# Patient Record
Sex: Female | Born: 1950 | Race: White | Hispanic: No | Marital: Married | Smoking: Current every day smoker
Health system: Southern US, Community
[De-identification: ages and names within clinical notes are randomized; demographics above are authoritative.]

## PROBLEM LIST (undated history)

## (undated) DIAGNOSIS — R569 Unspecified convulsions: Secondary | ICD-10-CM

## (undated) DIAGNOSIS — Z860100 Personal history of colon polyps, unspecified: Secondary | ICD-10-CM

## (undated) DIAGNOSIS — N3941 Urge incontinence: Secondary | ICD-10-CM

## (undated) DIAGNOSIS — M51369 Other intervertebral disc degeneration, lumbar region without mention of lumbar back pain or lower extremity pain: Secondary | ICD-10-CM

## (undated) DIAGNOSIS — I700 Atherosclerosis of aorta: Secondary | ICD-10-CM

## (undated) DIAGNOSIS — R768 Other specified abnormal immunological findings in serum: Secondary | ICD-10-CM

## (undated) DIAGNOSIS — G2581 Restless legs syndrome: Secondary | ICD-10-CM

## (undated) DIAGNOSIS — K3184 Gastroparesis: Secondary | ICD-10-CM

## (undated) DIAGNOSIS — I7300 Raynaud's syndrome without gangrene: Secondary | ICD-10-CM

## (undated) DIAGNOSIS — C539 Malignant neoplasm of cervix uteri, unspecified: Secondary | ICD-10-CM

## (undated) DIAGNOSIS — R531 Weakness: Secondary | ICD-10-CM

## (undated) DIAGNOSIS — I10 Essential (primary) hypertension: Secondary | ICD-10-CM

## (undated) DIAGNOSIS — M5030 Other cervical disc degeneration, unspecified cervical region: Secondary | ICD-10-CM

## (undated) DIAGNOSIS — J449 Chronic obstructive pulmonary disease, unspecified: Secondary | ICD-10-CM

## (undated) DIAGNOSIS — K769 Liver disease, unspecified: Secondary | ICD-10-CM

## (undated) DIAGNOSIS — M1990 Unspecified osteoarthritis, unspecified site: Secondary | ICD-10-CM

## (undated) DIAGNOSIS — M549 Dorsalgia, unspecified: Secondary | ICD-10-CM

## (undated) DIAGNOSIS — F32A Depression, unspecified: Secondary | ICD-10-CM

## (undated) DIAGNOSIS — I872 Venous insufficiency (chronic) (peripheral): Secondary | ICD-10-CM

## (undated) DIAGNOSIS — Z923 Personal history of irradiation: Secondary | ICD-10-CM

## (undated) DIAGNOSIS — Z8669 Personal history of other diseases of the nervous system and sense organs: Secondary | ICD-10-CM

## (undated) DIAGNOSIS — K648 Other hemorrhoids: Secondary | ICD-10-CM

## (undated) DIAGNOSIS — K5790 Diverticulosis of intestine, part unspecified, without perforation or abscess without bleeding: Secondary | ICD-10-CM

## (undated) DIAGNOSIS — R911 Solitary pulmonary nodule: Secondary | ICD-10-CM

## (undated) DIAGNOSIS — M7989 Other specified soft tissue disorders: Secondary | ICD-10-CM

## (undated) DIAGNOSIS — I8390 Asymptomatic varicose veins of unspecified lower extremity: Secondary | ICD-10-CM

## (undated) DIAGNOSIS — M7710 Lateral epicondylitis, unspecified elbow: Secondary | ICD-10-CM

## (undated) DIAGNOSIS — I639 Cerebral infarction, unspecified: Secondary | ICD-10-CM

## (undated) DIAGNOSIS — K219 Gastro-esophageal reflux disease without esophagitis: Secondary | ICD-10-CM

## (undated) DIAGNOSIS — M5136 Other intervertebral disc degeneration, lumbar region: Secondary | ICD-10-CM

## (undated) DIAGNOSIS — G8929 Other chronic pain: Secondary | ICD-10-CM

## (undated) DIAGNOSIS — H269 Unspecified cataract: Secondary | ICD-10-CM

## (undated) DIAGNOSIS — R7303 Prediabetes: Secondary | ICD-10-CM

## (undated) DIAGNOSIS — F419 Anxiety disorder, unspecified: Secondary | ICD-10-CM

## (undated) DIAGNOSIS — F329 Major depressive disorder, single episode, unspecified: Secondary | ICD-10-CM

## (undated) DIAGNOSIS — J189 Pneumonia, unspecified organism: Secondary | ICD-10-CM

## (undated) DIAGNOSIS — G5600 Carpal tunnel syndrome, unspecified upper limb: Secondary | ICD-10-CM

## (undated) DIAGNOSIS — H9190 Unspecified hearing loss, unspecified ear: Secondary | ICD-10-CM

## (undated) DIAGNOSIS — M479 Spondylosis, unspecified: Secondary | ICD-10-CM

## (undated) DIAGNOSIS — R0600 Dyspnea, unspecified: Secondary | ICD-10-CM

## (undated) DIAGNOSIS — D509 Iron deficiency anemia, unspecified: Secondary | ICD-10-CM

## (undated) DIAGNOSIS — Z9289 Personal history of other medical treatment: Secondary | ICD-10-CM

## (undated) DIAGNOSIS — I739 Peripheral vascular disease, unspecified: Secondary | ICD-10-CM

## (undated) DIAGNOSIS — G4730 Sleep apnea, unspecified: Secondary | ICD-10-CM

## (undated) HISTORY — PX: VAGINAL HYSTERECTOMY: SUR661

## (undated) HISTORY — PX: NECK SURGERY: SHX720

## (undated) HISTORY — PX: CHOLECYSTECTOMY: SHX55

## (undated) HISTORY — PX: TUBAL LIGATION: SHX77

## (undated) HISTORY — PX: BACK SURGERY: SHX140

## (undated) HISTORY — PX: TOE SURGERY: SHX1073

## (undated) HISTORY — PX: KNEE SURGERY: SHX244

## (undated) HISTORY — PX: INTERSTIM IMPLANT PLACEMENT: SHX5130

## (undated) HISTORY — DX: Unspecified convulsions: R56.9

## (undated) HISTORY — DX: Liver disease, unspecified: K76.9

## (undated) HISTORY — PX: HAND SURGERY: SHX662

## (undated) HISTORY — PX: TOTAL HIP ARTHROPLASTY: SHX124

## (undated) HISTORY — PX: HEMORRHOID SURGERY: SHX153

## (undated) HISTORY — PX: BLEPHAROPLASTY: SUR158

## (undated) HISTORY — PX: CATARACT EXTRACTION, BILATERAL: SHX1313

---

## 1998-09-20 ENCOUNTER — Encounter: Payer: Self-pay | Admitting: Emergency Medicine

## 1998-09-20 ENCOUNTER — Observation Stay (HOSPITAL_COMMUNITY): Admission: EM | Admit: 1998-09-20 | Discharge: 1998-09-21 | Payer: Self-pay | Admitting: Emergency Medicine

## 2000-01-31 ENCOUNTER — Ambulatory Visit (HOSPITAL_COMMUNITY): Admission: RE | Admit: 2000-01-31 | Discharge: 2000-01-31 | Payer: Self-pay | Admitting: Obstetrics and Gynecology

## 2000-06-09 ENCOUNTER — Encounter: Payer: Self-pay | Admitting: Obstetrics and Gynecology

## 2000-06-16 ENCOUNTER — Inpatient Hospital Stay (HOSPITAL_COMMUNITY): Admission: RE | Admit: 2000-06-16 | Discharge: 2000-06-18 | Payer: Self-pay | Admitting: Obstetrics and Gynecology

## 2000-06-16 ENCOUNTER — Encounter (INDEPENDENT_AMBULATORY_CARE_PROVIDER_SITE_OTHER): Payer: Self-pay | Admitting: Specialist

## 2001-06-08 ENCOUNTER — Emergency Department (HOSPITAL_COMMUNITY): Admission: EM | Admit: 2001-06-08 | Discharge: 2001-06-08 | Payer: Self-pay | Admitting: Emergency Medicine

## 2001-06-08 ENCOUNTER — Encounter: Payer: Self-pay | Admitting: Emergency Medicine

## 2001-06-11 ENCOUNTER — Ambulatory Visit (HOSPITAL_COMMUNITY): Admission: RE | Admit: 2001-06-11 | Discharge: 2001-06-11 | Payer: Self-pay | Admitting: Cardiology

## 2001-08-02 ENCOUNTER — Encounter: Payer: Self-pay | Admitting: Emergency Medicine

## 2001-08-02 ENCOUNTER — Emergency Department (HOSPITAL_COMMUNITY): Admission: EM | Admit: 2001-08-02 | Discharge: 2001-08-02 | Payer: Self-pay | Admitting: Emergency Medicine

## 2001-08-20 ENCOUNTER — Ambulatory Visit (HOSPITAL_COMMUNITY): Admission: RE | Admit: 2001-08-20 | Discharge: 2001-08-20 | Payer: Self-pay | Admitting: Internal Medicine

## 2001-08-26 ENCOUNTER — Ambulatory Visit (HOSPITAL_COMMUNITY): Admission: RE | Admit: 2001-08-26 | Discharge: 2001-08-26 | Payer: Self-pay | Admitting: Internal Medicine

## 2001-08-27 ENCOUNTER — Encounter (INDEPENDENT_AMBULATORY_CARE_PROVIDER_SITE_OTHER): Payer: Self-pay | Admitting: *Deleted

## 2001-08-27 ENCOUNTER — Encounter: Payer: Self-pay | Admitting: Internal Medicine

## 2001-08-27 ENCOUNTER — Ambulatory Visit (HOSPITAL_COMMUNITY): Admission: RE | Admit: 2001-08-27 | Discharge: 2001-08-27 | Payer: Self-pay | Admitting: Internal Medicine

## 2001-11-30 ENCOUNTER — Encounter: Payer: Self-pay | Admitting: Internal Medicine

## 2002-09-29 ENCOUNTER — Ambulatory Visit (HOSPITAL_BASED_OUTPATIENT_CLINIC_OR_DEPARTMENT_OTHER): Admission: RE | Admit: 2002-09-29 | Discharge: 2002-09-29 | Payer: Self-pay | Admitting: Orthopedic Surgery

## 2002-09-29 ENCOUNTER — Encounter (INDEPENDENT_AMBULATORY_CARE_PROVIDER_SITE_OTHER): Payer: Self-pay | Admitting: Specialist

## 2003-02-01 ENCOUNTER — Encounter: Admission: RE | Admit: 2003-02-01 | Discharge: 2003-02-01 | Payer: Self-pay | Admitting: Orthopedic Surgery

## 2003-02-04 ENCOUNTER — Ambulatory Visit (HOSPITAL_COMMUNITY): Admission: RE | Admit: 2003-02-04 | Discharge: 2003-02-04 | Payer: Self-pay | Admitting: General Surgery

## 2003-07-30 ENCOUNTER — Emergency Department (HOSPITAL_COMMUNITY): Admission: EM | Admit: 2003-07-30 | Discharge: 2003-07-30 | Payer: Self-pay

## 2004-07-03 ENCOUNTER — Ambulatory Visit: Payer: Self-pay | Admitting: Internal Medicine

## 2004-09-05 ENCOUNTER — Ambulatory Visit (HOSPITAL_COMMUNITY): Admission: RE | Admit: 2004-09-05 | Discharge: 2004-09-05 | Payer: Self-pay | Admitting: Neurosurgery

## 2005-02-12 ENCOUNTER — Ambulatory Visit: Payer: Self-pay | Admitting: Internal Medicine

## 2005-02-14 ENCOUNTER — Encounter: Admission: RE | Admit: 2005-02-14 | Discharge: 2005-02-14 | Payer: Self-pay | Admitting: Cardiology

## 2005-04-20 ENCOUNTER — Emergency Department (HOSPITAL_COMMUNITY): Admission: EM | Admit: 2005-04-20 | Discharge: 2005-04-20 | Payer: Self-pay | Admitting: Emergency Medicine

## 2005-09-05 ENCOUNTER — Ambulatory Visit: Payer: Self-pay | Admitting: Internal Medicine

## 2005-09-09 ENCOUNTER — Ambulatory Visit: Payer: Self-pay | Admitting: Internal Medicine

## 2005-09-09 ENCOUNTER — Encounter (INDEPENDENT_AMBULATORY_CARE_PROVIDER_SITE_OTHER): Payer: Self-pay | Admitting: *Deleted

## 2005-09-13 ENCOUNTER — Ambulatory Visit: Payer: Self-pay | Admitting: Internal Medicine

## 2005-10-21 ENCOUNTER — Ambulatory Visit: Payer: Self-pay | Admitting: Internal Medicine

## 2005-11-04 ENCOUNTER — Ambulatory Visit (HOSPITAL_COMMUNITY): Admission: RE | Admit: 2005-11-04 | Discharge: 2005-11-04 | Payer: Self-pay | Admitting: Gastroenterology

## 2005-11-04 ENCOUNTER — Encounter: Payer: Self-pay | Admitting: Gastroenterology

## 2005-11-07 ENCOUNTER — Ambulatory Visit: Payer: Self-pay | Admitting: Gastroenterology

## 2005-12-11 ENCOUNTER — Ambulatory Visit: Payer: Self-pay | Admitting: Internal Medicine

## 2006-11-05 ENCOUNTER — Ambulatory Visit: Payer: Self-pay | Admitting: Internal Medicine

## 2007-04-03 DIAGNOSIS — I10 Essential (primary) hypertension: Secondary | ICD-10-CM

## 2007-04-03 DIAGNOSIS — M549 Dorsalgia, unspecified: Secondary | ICD-10-CM | POA: Insufficient documentation

## 2007-04-03 DIAGNOSIS — K5730 Diverticulosis of large intestine without perforation or abscess without bleeding: Secondary | ICD-10-CM

## 2007-04-03 DIAGNOSIS — K3184 Gastroparesis: Secondary | ICD-10-CM | POA: Insufficient documentation

## 2007-04-03 DIAGNOSIS — C539 Malignant neoplasm of cervix uteri, unspecified: Secondary | ICD-10-CM | POA: Insufficient documentation

## 2007-04-03 DIAGNOSIS — K648 Other hemorrhoids: Secondary | ICD-10-CM | POA: Insufficient documentation

## 2007-04-03 DIAGNOSIS — R0789 Other chest pain: Secondary | ICD-10-CM

## 2007-04-03 DIAGNOSIS — D126 Benign neoplasm of colon, unspecified: Secondary | ICD-10-CM

## 2007-04-03 DIAGNOSIS — F17210 Nicotine dependence, cigarettes, uncomplicated: Secondary | ICD-10-CM

## 2007-04-03 DIAGNOSIS — E663 Overweight: Secondary | ICD-10-CM | POA: Insufficient documentation

## 2007-04-03 DIAGNOSIS — F341 Dysthymic disorder: Secondary | ICD-10-CM

## 2007-04-03 DIAGNOSIS — K219 Gastro-esophageal reflux disease without esophagitis: Secondary | ICD-10-CM

## 2007-09-28 ENCOUNTER — Ambulatory Visit (HOSPITAL_COMMUNITY): Admission: RE | Admit: 2007-09-28 | Discharge: 2007-09-28 | Payer: Self-pay | Admitting: Neurosurgery

## 2007-10-06 ENCOUNTER — Ambulatory Visit (HOSPITAL_COMMUNITY): Admission: RE | Admit: 2007-10-06 | Discharge: 2007-10-07 | Payer: Self-pay | Admitting: Neurosurgery

## 2007-11-26 ENCOUNTER — Encounter: Payer: Self-pay | Admitting: Internal Medicine

## 2008-04-28 ENCOUNTER — Encounter: Payer: Self-pay | Admitting: Internal Medicine

## 2009-04-10 ENCOUNTER — Ambulatory Visit: Payer: Self-pay | Admitting: Internal Medicine

## 2009-05-10 ENCOUNTER — Telehealth: Payer: Self-pay | Admitting: Internal Medicine

## 2009-05-28 ENCOUNTER — Emergency Department: Payer: Self-pay | Admitting: Emergency Medicine

## 2009-06-07 ENCOUNTER — Ambulatory Visit (HOSPITAL_COMMUNITY): Admission: RE | Admit: 2009-06-07 | Discharge: 2009-06-07 | Payer: Self-pay | Admitting: Podiatry

## 2010-02-13 NOTE — Assessment & Plan Note (Signed)
Summary: GERD    History of Present Illness Visit Type: Follow-up Visit Primary GI MD: Yancey Flemings MD Primary Provider: Hulda Marin, MD  Requesting Provider: n/a Chief Complaint: F/u for GERD. Pt states that acid reflux is getting worse  History of Present Illness:   60 year old white female with hypertension, morbid obesity, chronic tobacco abuse, chronic pain syndrome (back), GERD, gastroparesis, and diverticulosis. She has not been seen since October 2008. She presents today with chief complaint of worsening reflux symptoms as well as foul-smelling belching. She also request a refill of her GI medication. Patient states that she takes omeprazole 40 mg in the morning and 20 mg at night. Despite this she will get heartburn for which he takes antacids. She describes complaints of bloating as well as foul-smelling belching. Some chronic intermittent epigastric and chest pain which she has had for years. She continues to gain weight. She continues to smoke. Prior upper endoscopy has revealed Gastro peristalsis. This is felt secondary to her medications. No dysphagia. No lower GI complaints.. Her last EGD was August 2007. Endoscopic ultrasound October 2007. Colonoscopy November 2013.   GI Review of Systems    Reports acid reflux, bloating, and  heartburn.      Denies abdominal pain, belching, chest pain, dysphagia with liquids, dysphagia with solids, loss of appetite, nausea, vomiting, vomiting blood, weight loss, and  weight gain.        Denies anal fissure, black tarry stools, change in bowel habit, constipation, diarrhea, diverticulosis, fecal incontinence, heme positive stool, hemorrhoids, irritable bowel syndrome, jaundice, light color stool, liver problems, rectal bleeding, and  rectal pain.    Current Medications (verified): 1)  Prilosec 20 Mg Cpdr (Omeprazole) .... Take 2 Capsules Every Morning and 1 Capsule At Night 2)  Percocet 7.5-325 Mg Tabs (Oxycodone-Acetaminophen) .... One  Tablet By Mouth Every Four To Six Hours As Needed For Pain 3)  Hydrochlorothiazide 12.5 Mg Caps (Hydrochlorothiazide) .... One Tablet By Mouth Once Daily 4)  Cymbalta 30 Mg Cpep (Duloxetine Hcl) .... One Tablet By Mouth Once Daily 5)  Estrace 1 Mg Tabs (Estradiol) .... One Tablet By Mouth Once Daily 6)  Gabapentin 300 Mg Caps (Gabapentin) .... One Tablet By Mouth Three Times A Day As Needed  Allergies (verified): 1)  ! Codeine 2)  ! Sulfa  Past History:  Past Medical History: Current Problems:  CERVICAL CANCER (ICD-180.9) INTERNAL HEMORRHOIDS (ICD-455.0) HYPERTENSION (ICD-401.9) ANXIETY DEPRESSION (ICD-300.4) TOBACCO ABUSE (ICD-305.1) COLONIC POLYPS (ICD-211.3) DIVERTICULOSIS, SIGMOID COLON (ICD-562.10) CHEST PAIN, ATYPICAL (ICD-786.59) GASTROPARESIS (ICD-536.3) GERD (ICD-530.81) BACK PAIN, CHRONIC (ICD-724.5) OVERWEIGHT (ICD-278.02)  Past Surgical History: Cholecystectomy Back Surgery Hysterectomy Hemorrhoidectomy Left Knee surgery x3 Left hip replacement Right foot hammertoe release Carpal tunnel release Complete tooth extraction Neck Surgery   Family History: No FH of Colon Cancer:  Social History: Occupation: Disabled Married  Patient currently smokes.  Alcohol Use - no Daily Caffeine Use: 2-3 cups of coffee daily  Illicit Drug Use - no Smoking Status:  current Drug Use:  no  Review of Systems       The patient complains of back pain.  The patient denies allergy/sinus, anemia, anxiety-new, arthritis/joint pain, blood in urine, breast changes/lumps, change in vision, confusion, cough, coughing up blood, depression-new, fainting, fatigue, fever, headaches-new, hearing problems, heart murmur, heart rhythm changes, itching, menstrual pain, muscle pains/cramps, night sweats, nosebleeds, pregnancy symptoms, shortness of breath, skin rash, sleeping problems, sore throat, swelling of feet/legs, swollen lymph glands, thirst - excessive , urination - excessive ,  urination  changes/pain, urine leakage, vision changes, and voice change.    Vital Signs:  Patient profile:   60 year old female Height:      62 inches Weight:      214 pounds BMI:     39.28 BSA:     1.97 Pulse rate:   68 / minute Pulse rhythm:   regular BP sitting:   128 / 80  (left arm) Cuff size:   regular  Vitals Entered By: Ok Anis CMA (April 10, 2009 10:28 AM)  Physical Exam  General:  Well developed, obese,well nourished, no acute distress. Head:  Normocephalic and atraumatic. Eyes:  PERRLA, no icterus. Nose:  No deformity, discharge,  or lesions. Mouth:  No deformity or lesions. Tobacco stain tongue Neck:  Supple; no masses or thyromegaly. Lungs:  Clear throughout to auscultation. Heart:  Regular rate and rhythm; no murmurs, rubs,  or bruits. Abdomen:  Soft, obese,nontender and nondistended. No masses, hepatosplenomegaly or hernias noted. Normal bowel sounds. Msk:  Symmetrical with no gross deformities. Normal posture. Pulses:  Normal pulses noted. Extremities:  No  edema or deformities noted. Neurologic:  Alert and  oriented x4;  Skin:  Intact without significant lesions or rashes. Psych:  Alert and cooperative. Normal mood and affect.   Impression & Recommendations:  Problem # 1:  GERD (ICD-530.81) Chronic GERD. Problem is exacerbated by her ongoing weight gain, continued tobacco abuse, and medication-induced gastroparesis.  Plan: #1. Reflux precautions with attention to weight loss, discontinuation of smoking. #2. Change to Dexilant 60 mg daily. Multiple samples and a prescription of multiple refills submitted #3. 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 Due for routine followup screening colonoscopy November 2013. Keep that anniversary.  Patient Instructions: 1)  Dexilant samples given to patient along with Rx. sent to your pharmacy for you to pick up.   #30 x 11 RFs. 2)  GI Reflux brochure given and prevention diet sheet. 3)  Colon recall was placed for 11/2011 for reminder. 4)  The medication list was reviewed and reconciled.  All changed / newly prescribed medications were explained.  A complete medication list was provided to the patient / caregiver. 5)  copy printed and given to patient. Milford Cage [facility]  April 10, 2009 11:04 AM 6)  Copy: Dr. Thyra Breed Prescriptions: DEXILANT 60 MG CPDR (DEXLANSOPRAZOLE) 1 capsule by mouth 30 minutes before breakfast  #30 x 11   Entered by:   Milford Cage NCMA   Authorized by:   Hilarie Fredrickson MD   Signed by:   Milford Cage NCMA on 04/10/2009   Method used:   Electronically to        [id number] (retail)       [address] [place], [place]  [address]       Ph: [phone]       Fax: [phone]   RxID:   [id number]

## 2010-02-13 NOTE — Progress Notes (Signed)
Summary: Samples of Dexilant   Phone Note Call from Patient Call back at Home Phone [phone]   Caller: Patient Call For: Dr. Marina Goodell Reason for Call: Talk to Nurse Summary of Call: Dexilant is too expensive...wants to know if she can get more samples or an alternative Initial call taken by: Karna Christmas,  May 10, 2009 11:42 AM  Follow-up for Phone Call        Dexilant is relieving her symptoms says Omeprazole did not so she is going to call several pharmacies Re; best price and will call back.  Teryl Lucy RN  May 10, 2009 12:14 PM

## 2010-03-22 ENCOUNTER — Ambulatory Visit: Payer: Self-pay | Admitting: Specialist

## 2010-03-28 ENCOUNTER — Ambulatory Visit: Payer: Self-pay | Admitting: Specialist

## 2010-04-13 ENCOUNTER — Ambulatory Visit: Payer: Self-pay | Admitting: Specialist

## 2010-05-29 NOTE — Assessment & Plan Note (Signed)
[facility]                         GASTROENTEROLOGY OFFICE NOTE   NAME:Speak, ANGELLI BARUCH               MRN:          [id number]  DATE:11/05/2006                            DOB:          13-Feb-1950    HISTORY:  This is a 60 year old female with a history of obesity and  chronic back pain who is followed in this office for reflux disease,  gastroparesis, and atypical chest pain. She was last evaluated in the  office December 11, 2005, see that dictation for details. She is  continued on Prilosec 20 mg 2-4 times daily. She tells me that when she  takes the drug three times daily on a more consistent basis her symptoms  are much better controlled. She needs less in the way of supplemental  antacids. She has also been on Reglan 10 mg t.i.d. before meals. This  has also been helpful. She continues with some intermittent atypical  chest pain. Her only other GI complaint is that of intermittent greenish  colored stools. However, no problems with constipation, diarrhea,  abdominal pain, or weight loss. There is no family history of colon  cancer. Her last complete colonoscopy was performed in November 2003.   PHYSICAL EXAMINATION:  Revealed sigmoid diverticulosis and diminutive  colon polyps, which were removed and found to be hyperplastic. At that  time, the recommendation was for followup in five years. I have  discussed with her current recommendations for such an examination (IE-  ten years assuming no other signs or symptoms).   CURRENT MEDICATIONS:  1. Estradiol 0.5 mg b.i.d.  2. Paroxetine 20 mg daily.  3. Omeprazole 2-4 times daily.  4. Metoclopramide 10 mg before meals.  P.r.n. medications include:  1. Nitroglycerine sublingual.  2. Tylenol.  3. Excedrin.  4. Oxycodone.  5. Rolaids.   PHYSICAL EXAMINATION:  Well-appearing female in no acute distress. Blood  pressure 130/72, heart rate 84, weight 205.8 pounds.  HEENT: Sclerae anicteric.  Conjunctivae are pink.  LUNGS:  Are clear.  HEART: Is regular.  ABDOMEN: Obese, soft without tenderness, mass or hernia.  EXTREMITIES: Are without edema.   IMPRESSION:  1. Gastroesophageal reflux disease. Symptoms are best controlled with      three times daily omeprazole.  2. History of gastroparesis.  3. Atypical chest pain.  4. General medical problems.  5. Screening colonoscopy in 2003, revealing mild diverticulosis and      hyperplastic polyp with excellent colonic preparation.   RECOMMENDATIONS:  1. Change Prilosec or omeprazole to 40 mg in the morning and 20 mg at      night.  2. Continue metoclopramide 10 mg before meals.  3. Revise followup screening colonoscopy to 3-5 years from this date.      Discussed with the patient.  4. Resume general medical care with Dr.  Vear Clock.  5. Routine GI followup in one year unless interval questions or      problems.     Wilhemina Bonito. Marina Goodell, MD  Electronically Signed    JNP/MedQ  DD: 11/05/2006  DT: 11/05/2006  Job #: [id number]   cc:   Loma Sender

## 2010-05-29 NOTE — Op Note (Signed)
NAMESHANTEE, HAYNE             ACCOUNT NO.:  [id number]   MEDICAL RECORD NO.:  [id number]          PATIENT TYPE:  OIB   LOCATION:  3537                         FACILITY:  [facility]   PHYSICIAN:  Hilda Lias, M.D.   DATE OF BIRTH:  10/09/1950   DATE OF PROCEDURE:  10/06/2007  DATE OF DISCHARGE:                               OPERATIVE REPORT   PREOPERATIVE DIAGNOSIS:  Cervical spondylosis and herniated disk, C4-5,  C5-6.   POSTOPERATIVE DIAGNOSIS:  Cervical spondylosis and herniated disk, C4-5,  C5-6.   PROCEDURE:  Anterior C4-5 and C5-6 diskectomy, decompression of the  spinal cord, bilateral foraminotomy, removal of herniated disk with  fragment, microscope, interbody fusion with allograft and plate.   SURGEON:  Hilda Lias, M.D.   ASSISTANT:  Stefani Dama, M.D.   CLINICAL HISTORY:  Ms. Natal is a lady who has been seen in my office  before her neck pain raised up to the left upper extremity.  She has  failed conservative treatment.  She was seen by the hand surgeon who  rule out any probable peripheral nerve.  X-ray showed herniated disk and  spondylosis at C4-5 and C5-6.  Surgery was advised in view of no  improvement.   PROCEDURE IN DETAIL:  The patient was taken to the OR.  After  intubation, the left side of the neck was cleaned with DuraPrep.  The  patient is quite obese with a short neck.  Incision was made through the  skin, subcutaneous tissue, until we were able to dissect all the way  down to the cervical area.  X-ray showed that we were at the level of C4-  5.  From then on we removed the anterior osteophyte at 4-5 and 5-6and  with the microscope we entered the disk space.  At the level of 4-5, we  did a total anterior diskectomy with decompression of the spinal cord.  To the right side, she had quite foraminal stenosis and so to the left  with a herniated disk at the left side.  The same finding was  essentially at level of 5-6, with decompression of  both C6 nerve roots  plus as well as the central spinal cord.  Then, the endplate were  drilled and two piece of allograft of 7 mm with autograft in the middle  was inserted.  This was followed by a plate using 5 screws.  The patient  had quite a bit of osteoporotic bone.  At the end, we have a good  decompression.  X-ray showed that the top of the plate was in good  position at the L4-5.  We were able to see the lower part.  We waited 5  minutes just to be sure that we achieved good hemostasis, and once this  was accomplished, the wound was closed with Vicryl and Steri-Strips.           ______________________________  Hilda Lias, M.D.     EB/MEDQ  D:  10/06/2007  T:  10/07/2007  Job:  [id number]

## 2010-06-01 NOTE — Discharge Summary (Signed)
[facility]  Patient:    [name], [name]               MRN: [id number] Adm. Date:  [id number] Disc. Date: [id number] Attending:  Lendon Colonel                           Discharge Summary  ADMISSION DIAGNOSES: 1. Severe dysmenorrhea and menorrhagia. 2. History of focal pelvic endometriosis.  DISCHARGE DIAGNOSES: 1. Severe dysmenorrhea and menorrhagia. 2. History of focal pelvic endometriosis.  OPERATION:  Total abdominal hysterectomy, bilateral salpingo-oophorectomy.  BRIEF HISTORY:  [name] is a 60 year old female who was admitted to the hospital for persistent heavy, painful periods and pelvic pain.  She had had a history of minimal endometriosis treated with laparoscopic laser.  LABORATORIES:  Hemoglobin on admission 13.  Routine metabolic panel was normal.  Cardiogram and chest x-ray was normal.  HOSPITAL COURSE:  Patient was admitted to the hospital, underwent an uneventful hysterectomy with removal of both ovaries.  Pathology report revealed the uterus to weight 147 g, had endocervical hyperplasia, focal endometriosis of both tubes and ovaries.  Her postoperative course was uncomplicated.  She was discharged to home in office care after two days in the hospital.  She was asked to call for fever or bleeding or any other difficulties.  She will begin on Estratest half-strength one q.d.  She was given Percocet and Ambien for pain.  CONDITION ON DISCHARGE:  Improved. DD:  06/26/00 TD:  06/26/00 Job: [id number]

## 2010-06-01 NOTE — Cardiovascular Report (Signed)
[place]  Patient:    [name], [name] Visit Number: [id number] MRN: [id number]          Service Type: CAT Location: [facility] 2855 01 Attending Physician:  Norman Clay Dictated by:   Darden Palmer., M.D. Proc. Date: 06/11/01 Admit Date:  06/11/2001 Discharge Date: 06/11/2001   CC:         Desma Maxim, M.D.   Cardiac Catheterization  HISTORY: The patient is a 60 year old female who had a recent negative Cardiolite scan, but developed continued chest discomfort requiring emergency room visits and relieved with nitroglycerin. Significantly anxious about condition and catheterization is done to exclude coronary artery disease.  COMMENTS ABOUT PROCEDURE: The patient tolerated the procedure well without complications and had good hemostasis and peripheral pulses present following the procedure.  HEMODYNAMIC DATA: Aorta post contrast 139/71, LV post contrast 139/16-20.  ANGIOGRAPHIC DATA:  LEFT VENTRICULOGRAM: Left ventriculogram performed in the 30-degree RAO projection.  Aortic valve is normal. The mitral valve appears normal.  The left ventricle is hyperdynamic. The estimated ejection fraction was 70-75%. Coronary arteries arise and distribute normally. No significant coronary calcification is noted.  Left main coronary artery appears normal.  Left anterior descending has a large diagonal branch. The continuation branch of the LAD after the diagonal is somewhat small in size and terminates in the distal anterior wall. There was no significant disease noted in the LAD.  The circumflex coronary artery: A single marginal artery arises which has a mild 20% mid vessel stenosis.  The right coronary artery has 10-20% proximal stenosis and is a large vessel that supplies a large posterior descending artery that supplies the apex. Two smaller posterolateral branches and then a large posterolateral branch  which are free of disease.  IMPRESSION: 1. Normal left ventricular function. 2. No significant coronary artery disease noted of significance, mild    disease noted in the circumflex and right coronary artery. Dictated by:   Darden Palmer., M.D. Attending Physician:  Norman Clay DD:  06/11/01 TD:  06/12/01 Job: [id number]

## 2010-06-01 NOTE — Assessment & Plan Note (Signed)
[facility]                           GASTROENTEROLOGY OFFICE NOTE   NAME:[name]               MRN:          [id number]  DATE:09/05/2005                            DOB:          1950-01-30    REFERRED BY:  Patient is self referred.   REASON FOR CONSULTATION:  1. Chest pain.  2. Reflux.   HISTORY:  This is a 60 year old female with hypertension, morbid obesity,  chronic tobacco abuse, gastroesophageal reflux disease, anxiety/depression,  atypical chest pain, chronic back problems, and multiple prior surgeries who  presents for evaluation of chest pain and reflux.  The patient was evaluated  in January of 2007 for the same chest pain.  She describes approximately  once per week an unpredictable 30-minute episode of stabbing chest pain with  some radiation into the back and jaw.  She takes __________ acid suppressive  agents  The pain lasts for about 30-minutes.  This does not occur at night.  There are no precipitating factors such as meals, activity or stress.  She  was evaluated by Dr. Donnie Aho earlier this year and her pain is not felt to be  cardiac in nature.  She has had her gallbladder out.  Next she complains of  problems with sour-burp, regurgitation, sour material.  She states she has  improved her diet though she continues to smoke.  She takes Omeprazole 20 mg  daily.  Also TUMS p.r.n.  There is no vomiting or dysphagia.  No evidence of  GI bleeding.  She is concerned about an ulcer.   CURRENT MEDICATIONS:  1. Estradiol, Paroxetine, Omeprazole, cyclobenzaprine, p.r.n. medications      include Oxycodone, Rolaids, Excedrin, Tylenol, and nitroglycerin.   PHYSICAL EXAMINATION:  GENERAL:  Physical exam finds a pleasant but somewhat  anxious female in no acute distress.  VITAL SIGNS:  Blood pressure 120/80.  Heart rate 88.  Weight is 213.2 pounds  (decreased 8 pounds).  HEENT:  Sclerae are anicteric, conjunctiva are pink,  oral mucosa intact.  No  adenopathy.  LUNGS:  Clear.  HEART:  Regular.  ABDOMEN:  Obese and soft without tenderness, masses or hernia.  Good bowel  sounds heard.   IMPRESSION:  1. Intermittent sharp chest pain of uncertain cause.  Not certain that      this is gastrointestinal in nature.  Negative cardiac evaluation with      Dr. Donnie Aho earlier this year.  Possibly musculoskeletal.  No evidence      of esophageal motility disorder on prior monometry.  2. Gastroesophageal reflux disease and dyspepsia.  Prior endoscopy with      some retained food.  Also delayed gastric emptying on gastric emptying      scan.   RECOMMENDATIONS:  1. Increase Prilosec to 20 mg b.i.d.  2. Schedule upper endoscopy to evaluate pain and dyspepsia.  3. Abdominal ultrasound.  4. Liver function tests.  5. Consider Reglan therapy post endoscopy pending results.  Wilhemina Bonito. Eda Keys., MD   JNP/MedQ  DD:  09/05/2005  DT:  09/05/2005  Job #:  [id number]   cc:   Georga Hacking, MD

## 2010-06-01 NOTE — H&P (Signed)
[place]  Patient:    [name], [name] Visit Number: [id number] MRN: [id number]          Service Type: EMS Location: [facility] Attending Physician:  Hanley Seamen Dictated by:   Darden Palmer., M.D. Admit Date:  06/08/2001 Discharge Date: 06/08/2001   CC:         Desma Maxim, M.D.   History and Physical  CHIEF COMPLAINT: The patient is a 60 year old female, who is seen for cardiac catheterization.  HISTORY OF PRESENT ILLNESS: The patient was admitted to the hospital in September 2000, where she had had a recent viral illness and awoke with pain and aching in her epigastrium that radiated across her left lower chest.  She was dizzy on arising and became diaphoretic and nauseated after using the commode, had pain in her back described as a crushing type pain.  She was evaluated in the emergency room at [facility] and later had a Cardiolite done through our office that was unremarkable.  She was given a prescription for nitroglycerin at that time and has periodically used it over the years.  She has a significant GI history also previously.  An echocardiogram at that time showed a small pericardial effusion and LVH, with normal systolic function.  Since that time she has continued to use nitroglycerin and has had episodic chest discomfort.  She has complained of atypical chest pain beginning several weeks ago that had frightened her.  She saw Dr. Maurice Small and noted sharp pain that would wake her, lasting seconds, feeling like somebody kicked her in the chest.  She would then complain of a midsternal pain described as a pressing type pain that would awaken her.  She complains that both hands would be numb, left greater than right, complained of neck pain, and said at times that she had no use of her left hand.  She has had epigastric pain radiating to her chest.  She was placed on Toprol and we were  asked to see her.  She has been hesitant to take Toprol and has also been tapering herself off of Paxil.  She has also had significant headaches.  She was seen at our office and had a Cardiolite scan done with adenosine on Jun 04, 2001 that did not show any significant ischemia.  She went to the emergency room on Jun 08, 2001.  When she was sitting she became hot a diaphoretic, stood up and felt like somebody kicked her in the chest.  She then fell to the floor.  She felt like her eyes rolled back.  She was white in the face.  She was administered nitroglycerin, EMS was called, and she was hypertensive and taken to the [facility] Emergency Room with complaint of a severe headache.  She eventually ruled out for an MI and was sent home, and was seen on Jun 09, 2001.  She continued to complain of chest discomfort, felt as if something was wrong with her heart, and it was recommended that she be brought in for catheterization to exclude coronary disease as a cause for pain and to help with the work-up.  PAST MEDICAL HISTORY:  1. Remarkable for a history of anxiety and claustrophobia.  2. She has a history of mild hypertension recently.  3. She has no history of diabetes.  4. She does have a history of ulcers, peptic ulcer disease previously.  PAST SURGICAL HISTORY:  1.  Cholecystectomy.  2. Hip surgery.  3. Bilateral carpal tunnel syndrome surgery.  4. Foot surgery.  5. Previous lumbar back surgery.  ALLERGIES:  1. SULFA.  2. CODEINE.  CURRENT MEDICATIONS:  1. Toprol, which she has only taken sporadically.  2. Paxil.  FAMILY HISTORY: Positive for heart disease.  An uncle died of MI at age 60. Grandfather died of heart disease in his 44s.  Grandmother died of heart disease in her 71s.  SOCIAL HISTORY: She is married.  Her husband works for [employer].  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  She smokes heavily and continues to smoke about  a pack of cigarettes per day.  No alcohol or drug usage.  REVIEW OF SYSTEMS: Chronic anxiety and claustrophobia.  Has smoked heavily. She has a known hiatal hernia, peptic ulcer disease, previous lumbar back surgery.  She has had significant headaches that have been a problem.  Other than as noted above the remainder of the Review Of Systems is unremarkable.  PHYSICAL EXAMINATION:  GENERAL: Anxious, obese woman.  VITAL SIGNS: Weight 193 pounds.  Blood pressure is 110/74 sitting, 120/70 standing.  Pulse 84.  SKIN: Warm and dry.  HEENT/NECK: No JVD, mass, thyromegaly, or bruits.  LUNGS: Clear to A&P.  CARDIAC: Normal S1 and S2.  No murmur or rub.  ABDOMEN: Soft, obese, nontender.  EXTREMITIES: Pulses present and 2+.  IMPRESSION:  1. Chest pain with atypical features, with a recent negative Cardiolite scan,     but with ongoing symptoms and emergency room visit rule out coronary     artery disease.  2. Ongoing cigarette abuse.  3. History of peptic ulcer disease.  4. History of anxiety and claustrophobia.  PLAN: Brought in at this time for same-day cardiac catheterization, possible angioplasty and stenting.  The procedure was discussed with the patient fully including risks and she is agreeable and willing to proceed. Dictated by:   Darden Palmer., M.D. Attending Physician:  Hanley Seamen DD:  06/10/01 TD:  06/11/01 Job: [id number]

## 2010-06-01 NOTE — Op Note (Signed)
[facility]  Patient:    [name], [name]                    MRN: [id number] Proc. Date: 01/31/00 Adm. Date:  [id number] Disc. Date: [id number] Attending:  Lendon Colonel                           Operative Report  PREOPERATIVE DIAGNOSIS:       Painful incisional lesion suggestive of                               endometriosis.  POSTOPERATIVE DIAGNOSIS:      Painful incisional lesion suggestive of                               endometriosis.  OPERATION:                    Excision of lesion of incision.  SURGEON:                      Katherine Roan, M.D.  DESCRIPTION OF PROCEDURE:     Patient was placed in the supine position, prepped and draped in the usual fashion. The transverse incision was opened to the extent of about 4 inches and the lesion was excised elliptically and down to the fascia. All of the subcutaneous tissue was sent to the lab for study. NO unusual bleeding occurred. The subcutaneum was closed with two sutures of 3-0 Vicryl and then closed the skin with clips. The incision was infiltrated with about 16 cc of 0.5% Marcaine with epinephrine. Leelah tolerated this procedure well. DD:  01/31/00 TD:  02/01/00 Job: [id number]

## 2010-06-01 NOTE — Assessment & Plan Note (Signed)
[facility]                           GASTROENTEROLOGY OFFICE NOTE   NAME:[name], [name]               MRN:          [id number]  DATE:10/21/2005                            DOB:          July 13, 1950    HISTORY:  [name] presents today for a followup.  She is a 60 year old with  hypertension, morbid obesity, chronic tobacco abuse, gastroesophageal reflux  disease, anxiety with depression, chronic back problems, and ongoing  atypical chest pain.  She was evaluated in the office September 05, 2005 for  chest pain and reflux.  Her Prilosec was increased to 20 mg b.i.d.  She  underwent upper endoscopy September 13, 2005.  This was normal.  Reglan 10 mg  b.i.d. was added to her regimen and she states her chest discomfort seems  somewhat reminiscent to prior problems with her gallbladder.  Abdominal  ultrasound was performed.  No evidence of choledocholithiasis.  The bile  duct was somewhat generous at 10.3 mm in maximal diameter.  Fatty liver  present.  Liver function tests were mildly abnormal with an SGOT of 60, SGPT  53, and alkaline phosphatase 124.  She presents now for followup.  On the  increased dose of Prilosec and Reglan her reflux symptoms, water brash  regurgitation, and sour burp have improved.  However, she continues with  intermittent bouts of severe chest pain.  Some of the features seem more  consistent with musculoskeletal etiology.  However, others appear more  spontaneous and not affected by position.  Otherwise, no clinical change.   CURRENT MEDICATIONS:  1. Estradiol 0.5 mg b.i.d.  2. Paroxetine 20 mg daily.  3. Omeprazole 20 mg b.i.d.  4. Reglan 10 mg b.i.d.   PHYSICAL EXAM:  Well-appearing female in no acute distress.  Blood pressure 138/70, heart rate 80, weight is 209.6 pounds.  HEENT:  Sclerae anicteric.  LUNGS:  Clear.  HEART:  Regular.  ABDOMEN:  Obese and soft without tenderness, mass, or hernia.   IMPRESSION:  1.  Gastroesophageal reflux disease.  Symptoms improved on b.i.d. Prilosec      and Reglan.  2. Atypical chest pain, ongoing.  This may be musculoskeletal in nature.      However, with elevated liver tests, 10 mm bile duct, prior history of      choledocholithiasis, retained common duct stone as a cause for her      chest pain should be excluded.   RECOMMENDATIONS:  1. Continue Prilosec and Reglan for reflux.  2. Reflux precautions with attention to weight loss and discontinuing      smoking.  3. Schedule endoscopic ultrasound with Dr. Rob Bunting.  I have      discussed the nature of the procedure, as well as the risks, benefits,      and alternatives with the patient.  This to exclude common duct stone.      If stone is present, then the patient should have her procedure      converted to ERCP with sphincterotomy and stone extraction at that      time.  I have also discussed the nature of this procedure, as well as  its risks, with the patient.  She understood and agreed to proceed.  I      will see her back in followup after Dr. Christella Hartigan' endoscopic evaluation      is complete.            ______________________________  Wilhemina Bonito. Eda Keys., MD      JNP/MedQ  DD:  10/21/2005  DT:  10/22/2005  Job #:  [id number]   cc:   Georga Hacking, M.D.  Rachael Fee, MD

## 2010-06-01 NOTE — Assessment & Plan Note (Signed)
[facility]                         GASTROENTEROLOGY OFFICE NOTE   NAME:Mcmonigle, BRITLYN MARTINE               MRN:          [id number]  DATE:12/11/2005                            DOB:          January 28, 1950    HISTORY:  Kanylah presents today for followup.  She was last evaluated  October 21, 2005, for reflux disease as well as atypical chest pain.  She  was continued on Prilosec and Reglan for reflux.  We did set her up for  an endoscopic ultrasound with Dr. Christella Hartigan.  There was no evidence of  common bile duct stone.  The pancreas was normal.  No other significant  abnormalities on ultrasonographic imaging.  However, she was noted to  have significant retained gastric contents consistent with  gastroparesis.  She has had this problem in the past.  Dr. Christella Hartigan  adjusted her medications including changing the time of proton pump  inhibitor administration as well as increasing her Reglan from twice  daily to 4 times daily.  The patient has actually been taking Reglan 3  times daily.  Since her last visit, she reports improvement in symptoms.  She still has some ongoing regurgitation sour burps and some  discontinue.  She certainly seems to be relieved that she has not had  any significant disorders to explain the symptoms.  We did discuss  gastroparesis and how this may affect her symptoms complex.  She says if  she does have symptoms, they tend to be more at night.   CURRENT MEDICATIONS:  Estradiol, Paroxetine, cyclobenzaprine,  omeprazole, and Reglan.   PHYSICAL EXAMINATION:  GENERAL:  This is a well-appearing female in no  acute distress.  VITAL SIGNS:  Blood pressure 110/74, heart rate 76, weight 205.2 pounds.  ABDOMEN:  Soft without tenderness, mass, or hernia.  No succussion  splash.  Good bowel sounds heard.   IMPRESSION:  1. Gastroesophageal reflux disease.  2. Gastroparesis.  3. Atypical chest pain, possibly related to the above.  4. Multiple  general medical problems.   RECOMMENDATIONS:  1. Continue proton pump inhibitor therapy.  2. Continue metoclopramide.  She may also use an at-night dosage.  3. Resume general medical therapy with her primary care Jayleah Garbers.  4. Gastroenterology followup in 1 year unless interval questions or      problems.     Wilhemina Bonito. Marina Goodell, MD  Electronically Signed    JNP/MedQ  DD: 12/11/2005  DT: 12/11/2005  Job #: [id number]   cc:   Georga Hacking, M.D.

## 2010-06-01 NOTE — Op Note (Signed)
NAME:  [name], [name]                  ACCOUNT NO.:  [id number]   MEDICAL RECORD NO.:  [id number]                   PATIENT TYPE:  AMB   LOCATION:  DSC                                  FACILITY:  [facility]   PHYSICIAN:  Cindee Salt, M.D.                    DATE OF BIRTH:  [birth year]   DATE OF PROCEDURE:  09/29/2002  DATE OF DISCHARGE:                                 OPERATIVE REPORT   PREOPERATIVE DIAGNOSIS:  Carpal metacarpal arthritis, right thumb.   POSTOPERATIVE DIAGNOSIS:  Carpal metacarpal arthritis, right thumb.   OPERATION:  Suspension plasty, right thumb.   SURGEON:  Cindee Salt, M.D.   ASSISTANTCarolyne Fiscal.   ANESTHESIA:  General.   HISTORY:  The patient is a 60 year old female with a history of CMC  arthritis not responsive to conservative treatment.   DESCRIPTION OF PROCEDURE:  The patient was brought to the operating room.  A  general anesthetic was carried out without difficulty.  She was prepped and  draped using DuraPrep in the supine position with the right arm free.  The  limb was exsanguinated with an Esmarch bandage.  The tourniquet placed high  on the arm was inflated to 250 mmHg.  A hockey stick-type incision was made  and carried down through subcutaneous tissue.  Bleeders were  electrocauterized.  The radial nerve was identified.  Dissection was carried  down dorsal to the abductor pollicis longus volar to the extensor pollicis  brevis.  An incision was made in the capsule.  The capsule was then opened.  This revealed significant degenerative changes and osteophyte formation of  the trapezium.  The trapezium was then freed with blunt and sharp  dissection.  This was then removed piecemeal.  The entire trapezium was  removed.  The most dorsal aspect of the abductor pollicis longus was then  harvested.  A separate incision was then made over the muscular tendonous  junction of the abductor pollicis longus proximal to the first dorsal  compartment.  A  dorsal half of the abductor was then harvested using a 30  gauge monofilament wire with a cheese cutter after retrieving it through the  first dorsal compartment with a Engineer, water.  This was then  transected at the musculotendinous structure.  A hole was drilled in the  base of the first metacarpal dorsal to the abductor insertion through the  ulnar volar base.  A 3.5 mm Statak anchor was then inserted into the base of  the index metacarpal just distal to the facet for the first metacarpal.  The  facet was then roughened with a rongeur.  The tendon was then passed from a  distal to proximal direction through the base of the first metacarpal.  This  was then sutured to the anchor, firmly fixing it to the second metacarpal  base.  The remainder of the tendon was then rolled into an anchovy and  placed  into the space afforded by removal of the trapezium.  The wound was  copiously irrigated with saline.  The capsule was closed with figure-of-  eight 4-0 Vicryl sutures, the subcutaneous with 4-0 Vicryl, and the skin  with interrupted 5-0 nylon suture.  A sterile compressive dressing and  splint were applied.  It was noted that pressure on the thumb metacarpal did  not  allow any proximal translation to the scaphoid.  The patient was taken to  the recovery room for observation in satisfactory condition.  She is  admitted for overnight pain control.  She will be discharged on Talwin NX  and Keflex.                                               Cindee Salt, M.D.    GK/MEDQ  D:  09/29/2002  T:  09/29/2002  Job:  [id number]

## 2010-06-01 NOTE — H&P (Signed)
[facility]  Patient:    [name], [name]               MRN: [id number] Adm. Date:  [id number] Attending:  Lendon Colonel                         History and Physical  CHIEF COMPLAINT:  This patient is status post excision of endometriosis of incision, status post cesarean section. She came to the office on April 29 and states that she wanted a hysterectomy because of heavy painful periods. She sometimes misses two days of work. We had discussed alternate methods of treatment including Lupron and hysteroscopy and she desired to proceed with hysterectomy because of the above conditions. She had decided not to use birth control pills.  MEDICATIONS:  ______ 100 mg t.i.d., Zantac, Nitrostat p.r.n. chest pain, guiafenesin. She is on Trimox 500 two b.i.d. for bronchitis and she is on Aygestin now for menstrual suppression 5 mg twice a day and Paxil.  PAST SURGICAL HISTORY:  She has had a history of tubal ligation. She had at that time a laparoscopy, endometriosis was noted. She has had hammer toe surgery. She has had excision of endometriosis of the incision, hip replacement in 1999, hemorrhoidectomy and she has had four arthroscopies and a gallbladder and has had lumbar disk surgery at L5-S1.  REVIEW OF SYSTEMS:  HEENT:  She wears glasses. She is currently being treated for a sinus infection and upper respiratory infection. She denies dizziness, no decrease in visual or auditory acuity. HEART:  She has a history of irregular heart rate. She was admitted to the hospital in 2000 for a complete workup including a cardiolite stress test which was normal. She was advised to use Nitrostat p.r.n. because of some irregularities in chest pain. All workup has been negative to this point. She denies hypertension and chest pain at this time. LUNGS:  No chronic cough, no asthma. No history of shortness of breath or hemoptysis. GU:  She specifically denies  stress incontinence, no frequency, no history of UTIs. GI:  No bowel habit change, no melena. She does have regurgitation and heartburn. MUSCLES/BONES/JOINTS:  She has had arthroscopies x 4, hip replacement and back surgery. She is currently asymptomatic in this regard.  SOCIAL HISTORY:  She works at [employer]. She smokes two packs of cigarettes daily.  FAMILY HISTORY:  Her mother is deceased from a heart attack. Her father is 32 and has had hypertension. She has two brothers. Her mother and grandparents are diabetic.  PHYSICAL EXAMINATION:  VITAL SIGNS:  Reveals a weight of 191, blood pressure 140/80.  HEENT:  Examination of the ears, nose and throat are unremarkable. The oropharynx is not injected. Pupils are round and regular and reactive to light and accommodation.  NECK:  Supple. Thyroid is not enlarge. Carotid pulses are equal. No adenopathy appreciated, no bruits are heard.  BREASTS:  No masses or tenderness.  HEART:  Normal sinus rhythm, no murmurs, no heaves, thrills, rubs or gallops.  LUNGS:  Clear. Diaphragms move well with inspiration and expiration.  ABDOMEN:  Soft, flat. Liver, spleen, and kidneys are not palpated. There is a transverse incision that is well healed, somewhat nodular but no masses are noted. Bowel sounds are normal. No femoral bruits are heard.  PELVIC:  Reveals a normal vulva and vagina. The cervix is clean. The uterus is anterior, slightly enlarge. Adnexa negative. There is tenderness bilaterally. Rectovaginal confirms. Hemoccult  is negative.  EXTREMITIES:  Show good range of motion, equal pulses and reflexes.  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  PLAN:  Total abdominal hysterectomy, bilateral salpingo-oophorectomy. The risks and benefits have been discussed with the patient. DD:  06/16/00 TD:  06/16/00 Job: [id number]

## 2010-06-01 NOTE — Op Note (Signed)
   NAME:  [name], [name]                  ACCOUNT NO.:  [id number]   MEDICAL RECORD NO.:  [id number]                   PATIENT TYPE:  AMB   LOCATION:  ENDO                                 FACILITY:  [facility]   PHYSICIAN:  Wilhemina Bonito. Eda Keys., M.D. [facility]         DATE OF BIRTH:  09/12/1950   DATE OF PROCEDURE:  08/26/2001  DATE OF DISCHARGE:  08/26/2001                                 OPERATIVE REPORT   PROCEDURE:  Esophageal manometry.   INDICATIONS:  Dysphagia and chest pain.   HISTORY:  This is a 60 year old female who has recently been evaluated for  dysphagia, chest pain, and reflux symptoms.  She is now for esophageal  manometry to exclude motility disorder as a cause for symptoms.   DESCRIPTION OF PROCEDURE:  Esophageal manometry was carried out at the [facility] GI laboratory in the standard manner.  The lower  esophageal sphincter was identified.  Pressures were obtained, and the  findings were as follows:   1. The upper esophageal sphincter demonstrated normal coordination and     relaxation.  2. The esophageal body demonstrated normal peristalsis as manifested by     normal wave amplitude and normal wave propagation.  3. The lower esophageal sphincter resting pressure was normal at 37.6 mmHg.     In addition, the sphincter as noted to relax in a normal fashion with wet     swallowing.   IMPRESSION:  Normal esophageal manometry with no evidence of motility  disorder to explain symptoms.   RECOMMENDATIONS:  Office follow-up as planned.                                               Wilhemina Bonito. Eda Keys., M.D. [facility]    JNP/MEDQ  D:  09/08/2001  T:  09/10/2001  Job:  [id number]   cc:   Desma Maxim, M.D.   Darden Palmer., M.D.  [address]  [place]  [place] [address]  Fax: [phone]

## 2010-06-01 NOTE — Procedures (Signed)
[facility]                            ABDOMINAL ULTRASOUND REPORT   NAME:[name] [name] SANER               MRN:          [id number]  DATE:09/11/2005                            DOB:          [birth year]    ACCESSION NUMBER:  [id number].   INDICATIONS:  Chest pain.   READING PHYSICIAN:  Hedwig Morton. Juanda Chance, MD   PROCEDURE:  Multiplanar abdominal ultrasound imaging was performed in the  upright, supine, right and left lateral decubitus positions.   RESULTS:  Abdominal aorta 14 mm.  The IVC is poorly visualized.   The pancreas appears normal throughout the head, body and tail without  evidence of ductal dilatation, pancreatic masses, or peripancreatic  inflammation.   Status post cholecystectomy.  With common bile duct measuring 10.3 mm in  maximal diameter without evidence of intraluminal foci.   The liver has severely increased liver echo density without focal lesion.  There are no dilated ducts.  Overall liver size is upper limits of normal.   Kidneys are normal in appearance.  Right kidney 101 mm, left 113 mm.   Spleen is normal in size, measuring 94 x 46 mm without parenchymal lesion.   IMPRESSION:  1. Status post remote cholecystectomy with upper limits of normal common      bile duct consistent with post cholecystectomy state.  2. Serial increasing in liver echo density.  Mild hepatomegaly with normal-      sized spleen.  Rule out primary parenchymal liver disease.                                   Hedwig Morton. Juanda Chance, MD   DMB/MedQ  DD:  09/11/2005  DT:  09/12/2005  Job #:  [id number]   cc:   Wilhemina Bonito. Eda Keys., MD

## 2010-06-01 NOTE — Op Note (Signed)
[facility]  Patient:    [name], [name]               MRN: [id number] Proc. Date: 06/16/00 Adm. Date:  [id number] Attending:  Lendon Colonel                           Operative Report  PREOPERATIVE DIAGNOSIS:  Heavy, painful periods, persistent.  POSTOPERATIVE DIAGNOSIS:  Heavy, painful periods, persistent.  OPERATION:  Total abdominal hysterectomy, bilateral salpingo-oophorectomy.  DESCRIPTION OF PROCEDURE:  Under satisfactory general anesthesia in the supine position, prepped and draped in the usual fashion; Foley catheter was inserted.  Transverse incision was made in the skin and extended in layers through the peritoneal cavity which was entered vertically.  She had a previous gallbladder surgery which revealed adhesion to the entire right upper quadrant.  I was able to feel both kidneys, and they felt normal.  No periaortic adenopathy was appreciated.  There was no free fluid within the peritoneal cavity.  The uterus was about twice normal size and mobile.  Both ovaries were atrophic.  Infundibulopelvic ligaments bilaterally were skeletonized and ligated with 0 chromic sutures with the round ligaments. Bladder flap was created, and there was a marked amount of scarring from the previous section in that area.  The uterine arteries were clamped on each side and ligated with 0 chromic suture.  The uterosacral cardinals were then ligated.  The vagina was clamped and the specimens removed from the operative field.  The vagina was then closed with horizontal mattress sutures of 0 chromic.  Uterosacral ligaments were then plicated in the midline for vault support, and the cardinal uterosacral complex were sutured into the vaginal vault.  The bladder flap was closed with a 3-0 Vicryl.  Pedicles were hemostatically secure.  The abdomen was irrigated with copious amounts of irrigation.  The abdominal viscera were inspected and then placed in  their anatomic position.  The parietal peritoneum was closed with a 2-0 PDS.  The fascia was closed with 2-0 PDS and interrupted sutures of 0 Vicryl. Hemostasis was secure.  The subcutaneum was irrigated, and the skin was closed with clips.  The incision was then infiltrated with 0.5% Marcaine with epinephrine.  Kaho tolerated the procedure well and was sent to the recovery room in good condition. DD:  06/16/00 TD:  06/16/00 Job: [id number]

## 2010-07-20 ENCOUNTER — Ambulatory Visit (INDEPENDENT_AMBULATORY_CARE_PROVIDER_SITE_OTHER): Payer: Medicare PPO

## 2010-07-20 ENCOUNTER — Inpatient Hospital Stay (INDEPENDENT_AMBULATORY_CARE_PROVIDER_SITE_OTHER)
Admission: RE | Admit: 2010-07-20 | Discharge: 2010-07-20 | Disposition: A | Discharge status: Home / Self Care | Payer: Medicare PPO | Source: Ambulatory Visit | Attending: Family Medicine | Admitting: Family Medicine

## 2010-07-20 DIAGNOSIS — S61209A Unspecified open wound of unspecified finger without damage to nail, initial encounter: Secondary | ICD-10-CM

## 2010-07-20 DIAGNOSIS — Z23 Encounter for immunization: Secondary | ICD-10-CM

## 2010-07-24 NOTE — Consult Note (Signed)
NAMEJANNE, [name]             ACCOUNT NO.:  [id number]  MEDICAL RECORD NO.:  [id number]  LOCATION:                                 FACILITY:  PHYSICIAN:  Dionne Ano. Azarria Balint, M.D.DATE OF BIRTH:  [birth year]  DATE OF CONSULTATION: DATE OF DISCHARGE:                                CONSULTATION   [name] presents for evaluation.  She was seen and treated in the emergency department by Dr. Bradd Canary.  I was asked to see and treat her for her right index finger injury.  She fell over some chairs, sustained knee injury.  We have discussed this with her at length.  Her tetanus has been updated recently.  She notes no locking, popping, catching, but states she has quite a bit of pain.  She is rather talkative female.  The patient's husband is with her.  ALLERGIES:  CODEINE and SULFA.  MEDICATIONS:  Hydrochlorothiazide, Cymbalta, Percocet (she is under chronic pain management by Dr. Thyra Breed).  PAST MEDICAL HISTORY:  Borderline diabetes according to her report.  PAST SURGICAL HISTORY:  Carpal tunnel release, hip surgery, back, neck, and foot surgery.  SOCIAL HISTORY:  She lives with her husband for many years.  She denies prior injury to the finger.  REVIEW OF SYSTEMS:  Negative for fever, chills, nausea, vomiting, or malaise.  She does have chronic low back pain.  Her regular physician is Dr. Loma Sender.  Her pain management specialist is Dr. Thyra Breed.  PHYSICAL EXAMINATION:  On examination, she has normal HEENT examination. She has poor dentition notable.  Chest is clear.  She has normal left upper extremity examination.  Her lower extremity examination shows that she walks with non-antalgic gait.  Abdomen is protuberant, slightly obese, and nontender.  Right index finger has lacerations dorsoradially as well as ulnarly about the PIP joint.  There is two separate lacerations in question.  She is able to flex and extend, but complains of some  numbness.  After I discussed this with her, I performed pinprick evaluation and she had normal pinprick sensation.  I have discussed her the findings, at the present time, we have consented for I and D.  She was taken to the procedural suite, underwent intermetacarpal block by myself, followed by 10-minute Betadine scrub followed by the exploration of dorsoradial wound.  The dorsoradial wound had tendon encroachment, but did not need a formal repair.  I performed irrigation and debridement with excisional in nature with knife, curette, and scissors about the dorsal area followed by complex wound closure of 3 cm about the dorsal aspect of her finger, which she tolerated well. Following this, I then performed a similar second I and D.  Second I and D of the skin and subcutaneous tissue was accomplished about the ulnar aspect of PIP joints.  She tolerated this well with no complicating features.  The patient did not appear to have a lacerated digital nerve.  This was sutured with combination of 5-0 chromic and 4-0 Prolene.  She tolerated this well.  Following this, she was placed in a sterile bandage.  I have placed her on Keflex 500 mg one p.o. q.i.d. x7 days, and in  addition to this, gave 40 oxycodone 5 mg 1-2 q.4-6 hours p.r.n. pain p.o.  She understands that we will tell Dr. Vear Clock that she may have a bit more than her usual consumption of pain meds due to the recent injury.  I will sent a long enough to Dr. Vear Clock of course.  We will see her back in the office in 10 days to evaluate her.  She will begin dressing changes in 2-3 days and I gave her ample dressing supplies for this purpose.  It was a pleasure to see her today.  She left awake, alert, and oriented without problems.     Dionne Ano. Amanda Pea, M.D.     [facility]  D:  07/20/2010  T:  07/21/2010  Job:  [id number]  cc:   Loraine Leriche L. Vear Clock, M.D. Loma Sender, MD  Electronically Signed by Dominica Severin M.D. on  07/24/2010 08:28:15 PM

## 2010-10-15 LAB — CBC
HCT: 44
Hemoglobin: 14.8
MCHC: 33.7
Platelets: 189
RBC: 5.01
RDW: 14.6
WBC: 6.7

## 2010-10-15 LAB — GLUCOSE, CAPILLARY
Glucose-Capillary: 119 — ABNORMAL HIGH
Glucose-Capillary: 163 — ABNORMAL HIGH

## 2010-10-15 LAB — BASIC METABOLIC PANEL
Chloride: 102
Creatinine, Ser: 0.66
GFR calc non Af Amer: >60
Glucose, Bld: 110 — ABNORMAL HIGH

## 2010-10-17 LAB — GLUCOSE, CAPILLARY: Glucose-Capillary: 123 — ABNORMAL HIGH

## 2011-01-15 HISTORY — PX: OTHER SURGICAL HISTORY: SHX169

## 2011-03-06 ENCOUNTER — Ambulatory Visit: Payer: Self-pay | Admitting: Urology

## 2011-03-07 ENCOUNTER — Ambulatory Visit: Payer: Self-pay | Admitting: Anesthesiology

## 2011-03-19 ENCOUNTER — Ambulatory Visit: Payer: Self-pay | Admitting: Urology

## 2011-04-02 ENCOUNTER — Ambulatory Visit: Payer: Self-pay | Admitting: Urology

## 2011-07-31 ENCOUNTER — Observation Stay (HOSPITAL_COMMUNITY): Payer: Medicare PPO

## 2011-07-31 ENCOUNTER — Emergency Department (HOSPITAL_COMMUNITY): Payer: Medicare PPO

## 2011-07-31 ENCOUNTER — Emergency Department (HOSPITAL_COMMUNITY)
Admission: EM | Admit: 2011-07-31 | Discharge: 2011-07-31 | Disposition: A | Discharge status: Home / Self Care | Payer: Medicare PPO | Attending: Emergency Medicine | Admitting: Emergency Medicine

## 2011-07-31 ENCOUNTER — Encounter (HOSPITAL_COMMUNITY): Payer: Self-pay | Admitting: *Deleted

## 2011-07-31 DIAGNOSIS — T07XXXA Unspecified multiple injuries, initial encounter: Secondary | ICD-10-CM | POA: Insufficient documentation

## 2011-07-31 DIAGNOSIS — R042 Hemoptysis: Secondary | ICD-10-CM | POA: Insufficient documentation

## 2011-07-31 DIAGNOSIS — R569 Unspecified convulsions: Secondary | ICD-10-CM | POA: Insufficient documentation

## 2011-07-31 DIAGNOSIS — R05 Cough: Secondary | ICD-10-CM

## 2011-07-31 DIAGNOSIS — F17200 Nicotine dependence, unspecified, uncomplicated: Secondary | ICD-10-CM | POA: Insufficient documentation

## 2011-07-31 DIAGNOSIS — R0602 Shortness of breath: Secondary | ICD-10-CM | POA: Insufficient documentation

## 2011-07-31 DIAGNOSIS — X58XXXA Exposure to other specified factors, initial encounter: Secondary | ICD-10-CM | POA: Insufficient documentation

## 2011-07-31 HISTORY — DX: Essential (primary) hypertension: I10

## 2011-07-31 LAB — BASIC METABOLIC PANEL
BUN: 11 mg/dL (ref 6–23)
Creatinine, Ser: 0.64 mg/dL (ref 0.50–1.10)
GFR calc Af Amer: >90 mL/min (ref >90)
GFR calc non Af Amer: >90 mL/min (ref >90)
Potassium: 3.5 mEq/L (ref 3.5–5.1)

## 2011-07-31 LAB — POCT I-STAT TROPONIN I

## 2011-07-31 LAB — CBC
HCT: 41 % (ref 36.0–46.0)
MCHC: 32.4 g/dL (ref 30.0–36.0)
RDW: 15.8 % — ABNORMAL HIGH (ref 11.5–15.5)

## 2011-07-31 MED ORDER — LORAZEPAM 1 MG PO TABS
1.0000 mg | ORAL_TABLET | Freq: Once | ORAL | Status: AC
  Administered 2011-07-31: 1 mg via ORAL
  Filled 2011-07-31: qty 1

## 2011-07-31 MED ORDER — IPRATROPIUM BROMIDE 0.02 % IN SOLN
0.5000 mg | Freq: Once | RESPIRATORY_TRACT | Status: AC
  Administered 2011-07-31: 0.5 mg via RESPIRATORY_TRACT
  Filled 2011-07-31: qty 2.5

## 2011-07-31 MED ORDER — IBUPROFEN 200 MG PO TABS
600.0000 mg | ORAL_TABLET | Freq: Once | ORAL | Status: AC
  Administered 2011-07-31: 600 mg via ORAL
  Filled 2011-07-31: qty 3

## 2011-07-31 MED ORDER — IOHEXOL 350 MG/ML SOLN
100.0000 mL | Freq: Once | INTRAVENOUS | Status: AC | PRN
Start: 2011-07-31 — End: 2011-07-31
  Administered 2011-07-31: 100 mL via INTRAVENOUS

## 2011-07-31 MED ORDER — ALBUTEROL SULFATE (5 MG/ML) 0.5% IN NEBU
5.0000 mg | INHALATION_SOLUTION | Freq: Once | RESPIRATORY_TRACT | Status: AC
  Administered 2011-07-31: 5 mg via RESPIRATORY_TRACT
  Filled 2011-07-31: qty 1

## 2011-07-31 NOTE — ED Provider Notes (Signed)
History     CSN: [id number]  Arrival date & time 07/31/11  1034   First MD Initiated Contact with Patient 07/31/11 1104      Chief Complaint  Patient presents with  . Shortness of Breath  . Bleeding/Bruising    (Consider location/radiation/quality/duration/timing/severity/associated sxs/prior treatment) Patient is a 61 y.o. female presenting with shortness of breath. The history is provided by the patient and the spouse. No language interpreter was used.  Shortness of Breath  The current episode started today. The problem is moderate. Nothing relieves the symptoms. The symptoms are aggravated by activity. Associated symptoms include cough, shortness of breath and wheezing. Pertinent negatives include no chest pain, no fever, no rhinorrhea and no sore throat. She has inhaled smoke recently. She has had no prior hospitalizations. She has had no prior ICU admissions. She has had no prior intubations. Her past medical history is significant for past wheezing.   5am patient woke with SOB. She then went outside to get air and felt hot.  She then walked back in and took her blood pressure pill and inhaler.  She then felt a heaviness on her chest with chronic cough x 1 year.   She had some hemoptisis today.  She then went to sleep.  Woke 845 with the heaviness 6/10 with a headache.  She then took her medicine for her legs.  States that the heaviness is 4/10 presently with 2Lnc. pcp Dr. Salena Saner phillips in [place].  Never had these symptoms before.  States the cough is worse with laying down and she has had a cough x 1 year.  Pt now mentioned that she has had seizures this week x 3.  Husband states that she shakes all over.   Husband also states x 1 year. Smoker since age 3. 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    Past Medical History  Diagnosis Date  . Hypertension     No past surgical history on file.  No family history on file.  History  Substance Use Topics  . Smoking status: Current  Everyday Smoker  . Smokeless tobacco: Not on file  . Alcohol Use: No    OB History    Grav Para Term Preterm Abortions TAB SAB Ect Mult Living                  Review of Systems  Constitutional: Negative.  Negative for fever.  HENT: Negative.  Negative for sore throat and rhinorrhea.   Eyes: Negative.   Respiratory: Positive for cough, shortness of breath and wheezing.   Cardiovascular: Negative.  Negative for chest pain, palpitations and leg swelling.  Gastrointestinal: Positive for nausea. Negative for vomiting and diarrhea.  Neurological: Negative.   Psychiatric/Behavioral: Negative.   All other systems reviewed and are negative.    Allergies  Codeine and Sulfonamide derivatives  Home Medications   Current Outpatient Rx  Name Route Sig Dispense Refill  . ALBUTEROL SULFATE HFA 108 (90 BASE) MCG/ACT IN AERS Inhalation Inhale 2 puffs into the lungs at bedtime.    . DEXLANSOPRAZOLE 60 MG PO CPDR Oral Take 60 mg by mouth daily.    . DULOXETINE HCL 60 MG PO CPEP Oral Take 60 mg by mouth daily.    Marland Kitchen HYDROCHLOROTHIAZIDE 25 MG PO TABS Oral Take 25 mg by mouth daily.    . OXYCODONE-ACETAMINOPHEN 7.5-325 MG PO TABS Oral Take 1 tablet by mouth every 4 (four) hours as needed. For pain    . PANTOPRAZOLE SODIUM 40 MG  PO TBEC Oral Take 40 mg by mouth daily.    Marland Kitchen ROPINIROLE HCL 0.5 MG PO TABS Oral Take 0.5 mg by mouth 3 (three) times daily.      BP 134/109  Pulse 85  Temp 97.1 F (36.2 C) (Oral)  Resp 22  Ht 5\' 2"  (1.575 m)  Wt 203 lb 4 oz (92.194 kg)  BMI 37.18 kg/m2  SpO2 100%  Physical Exam  Nursing note and vitals reviewed. Constitutional: She is oriented to person, place, and time. She appears well-developed and well-nourished.  HENT:  Head: Normocephalic and atraumatic.  Eyes: Conjunctivae and EOM are normal. Pupils are equal, round, and reactive to light.  Neck: Normal range of motion. Neck supple.  Cardiovascular: Normal rate.   Pulmonary/Chest: Effort normal. No  respiratory distress. She has decreased breath sounds in the right middle field and the right lower field. She has no wheezes. She has no rhonchi. She has no rales.  Abdominal: Soft. Bowel sounds are normal.  Musculoskeletal: Normal range of motion. She exhibits no edema and no tenderness.  Neurological: She is alert and oriented to person, place, and time. She has normal reflexes.  Skin: Skin is warm and dry.  Psychiatric: She has a normal mood and affect.    ED Course  Procedures (including critical care time)   Nurse witnessed "seizure" activity that started in RUE then LUE then slow to respond after the episode that lasted a few minutes.  Suspect tremors.     Labs Reviewed  CBC - Abnormal; Notable for the following:    RDW 15.8 (*)     All other components within normal limits  BASIC METABOLIC PANEL - Abnormal; Notable for the following:    Glucose, Bld 134 (*)     All other components within normal limits  PRO B NATRIURETIC PEPTIDE  POCT I-STAT TROPONIN I   Dg Chest 2 View  07/31/2011  *RADIOLOGY REPORT*  Clinical Data: 61 year old female with shortness of breath and cough.  CHEST - 2 VIEW  Comparison: 10/06/2007.  Findings: Mediastinal contours are stable within normal limits. Lung volumes are stable and normal.  Mild diffuse increased interstitial markings are noted, otherwise the lungs are clear.  No pneumothorax or effusion. No acute osseous abnormality identified. Sequelae of cervical ACDF.  Upper abdominal surgical clips.  IMPRESSION: No acute cardiopulmonary abnormality.  Original Report Authenticated By: Harley Hallmark, M.D.     No diagnosis found.    MDM  Moved to CDU awaiting a CT angio r/o pe.  Woke with SOB (no pain) and hemoptisis.  Smoker Can go home if CT negative.  Follow up with neuro for tremors/seizures x 1 year.  Follow up with GI for blood in stool.  Chest x-ray unremarkable.  Ct head unremarkable.  Report called to [facility] for CDU holding. Labs Reviewed   CBC - Abnormal; Notable for the following:    RDW 15.8 (*)     All other components within normal limits  BASIC METABOLIC PANEL - Abnormal; Notable for the following:    Glucose, Bld 134 (*)     All other components within normal limits  PRO B NATRIURETIC PEPTIDE  POCT I-STAT TROPONIN I          Remi Haggard, NP 07/31/11 1608

## 2011-07-31 NOTE — ED Notes (Signed)
Patient states she had onset of not being able to breathe at 0500 today. She states she has had headache for 2 weeks.  Patient states she has had nose bleed intermittently,  She has noticed blood in her stool 2 to 3 times and she has coughed up blood at times.  Patient denies fever.  She has a little bit of chest pain

## 2011-07-31 NOTE — ED Provider Notes (Signed)
61 year old female has been having problems with a cough which is occasional productive of some bloody sputum, and also a nosebleed for the last week. She also intermittently has some blood in her bowel movements. She is scheduled for a colonoscopy later this year. Today, she had dyspnea and a slight chest pain which was worse with deep breathing. She's not had any fever chills or sweats. On exam, she has a slightly prolonged exhalation phase but no rales or overt wheezes. She was getting a nebulizer treatment at the time of my exam. Heart has regular rate and rhythm without murmur. Extremities have no cyanosis or edema. Although it is likely that when she is coughing up is actually blood or swelling from her nosebleed, the combination of chest pain, dyspnea and cough productive of bloody sputum is worrisome for possible pulmonary embolism and she will be sent for CT angiogram.  Dione Booze, MD 07/31/11 1356

## 2011-07-31 NOTE — ED Notes (Signed)
Patient asleep in stretcher on right side. Snoring noted. No respiratory effort. Deep breathing with no complications.

## 2011-07-31 NOTE — ED Provider Notes (Signed)
Patient here with SOB and chest pain x 1 day. SOB has subsided since being in ED. CXR shows no active disease. CT head negative. Awaiting CTA results. Patient is in NAD. No labored breathing. Cough present. 4:20 PM CTA negative for PE. Patient currently not SOB. Husband states cough is chronic due to smoking. Comfortable with discharge. Follow up with neuro for tremors/seizures x 1 year. Follow up with GI for blood in stool. Chest x-ray unremarkable. Ct head unremarkable.    Trevor Mace, PA-C 07/31/11 1645

## 2011-08-02 NOTE — ED Provider Notes (Signed)
Medical screening examination/treatment/procedure(s) were conducted as a shared visit with non-physician practitioner(s) and myself.  I personally evaluated the patient during the encounter   Ruvi Fullenwider, MD 08/02/11 1432 

## 2011-08-02 NOTE — ED Provider Notes (Signed)
Medical screening examination/treatment/procedure(s) were conducted as a shared visit with non-physician practitioner(s) and myself.  I personally evaluated the patient during the encounter   Dione Booze, MD 08/02/11 1432

## 2011-08-07 NOTE — Progress Notes (Signed)
Observation review is complete for 07/31/2011 visit.

## 2012-01-13 ENCOUNTER — Encounter: Payer: Self-pay | Admitting: Internal Medicine

## 2012-01-16 ENCOUNTER — Telehealth: Payer: Self-pay

## 2012-01-16 ENCOUNTER — Encounter: Payer: Self-pay | Admitting: Internal Medicine

## 2012-01-16 NOTE — Telephone Encounter (Signed)
Schedule recall colon and previsit with patient; patient acknowledged and understood the dates involved

## 2012-02-10 ENCOUNTER — Telehealth: Payer: Self-pay | Admitting: *Deleted

## 2012-02-10 NOTE — Telephone Encounter (Signed)
No show for previsit.  I spoke with Mr. Abramo and Left message for his wife to call and [facility] her previsit by 5pm today otherwise she will need to [facility] her colon and previsit.  No show letter sent.

## 2012-02-10 NOTE — Telephone Encounter (Signed)
Spoke with husband.  Pt to call and [facility] previsit

## 2012-02-19 ENCOUNTER — Encounter: Payer: Medicare PPO | Admitting: Internal Medicine

## 2012-05-07 ENCOUNTER — Encounter: Payer: Self-pay | Admitting: Internal Medicine

## 2012-08-24 ENCOUNTER — Inpatient Hospital Stay (HOSPITAL_COMMUNITY): Payer: Medicare PPO

## 2012-08-24 ENCOUNTER — Inpatient Hospital Stay (HOSPITAL_COMMUNITY)
Admission: EM | Admit: 2012-08-24 | Discharge: 2012-08-26 | DRG: 880 | Disposition: A | Discharge status: Home / Self Care | Payer: Medicare PPO | Attending: Internal Medicine | Admitting: Internal Medicine

## 2012-08-24 ENCOUNTER — Encounter (HOSPITAL_COMMUNITY): Payer: Self-pay | Admitting: Emergency Medicine

## 2012-08-24 ENCOUNTER — Emergency Department (HOSPITAL_COMMUNITY): Payer: Medicare PPO

## 2012-08-24 DIAGNOSIS — R531 Weakness: Secondary | ICD-10-CM

## 2012-08-24 DIAGNOSIS — R0789 Other chest pain: Secondary | ICD-10-CM

## 2012-08-24 DIAGNOSIS — R200 Anesthesia of skin: Secondary | ICD-10-CM

## 2012-08-24 DIAGNOSIS — K3184 Gastroparesis: Secondary | ICD-10-CM | POA: Diagnosis present

## 2012-08-24 DIAGNOSIS — F449 Dissociative and conversion disorder, unspecified: Principal | ICD-10-CM | POA: Diagnosis present

## 2012-08-24 DIAGNOSIS — R29898 Other symptoms and signs involving the musculoskeletal system: Secondary | ICD-10-CM

## 2012-08-24 DIAGNOSIS — R569 Unspecified convulsions: Secondary | ICD-10-CM | POA: Diagnosis present

## 2012-08-24 DIAGNOSIS — F341 Dysthymic disorder: Secondary | ICD-10-CM | POA: Diagnosis present

## 2012-08-24 DIAGNOSIS — I10 Essential (primary) hypertension: Secondary | ICD-10-CM | POA: Diagnosis present

## 2012-08-24 DIAGNOSIS — F411 Generalized anxiety disorder: Secondary | ICD-10-CM

## 2012-08-24 DIAGNOSIS — K219 Gastro-esophageal reflux disease without esophagitis: Secondary | ICD-10-CM | POA: Diagnosis present

## 2012-08-24 DIAGNOSIS — F17210 Nicotine dependence, cigarettes, uncomplicated: Secondary | ICD-10-CM | POA: Diagnosis present

## 2012-08-24 DIAGNOSIS — Z91199 Patient's noncompliance with other medical treatment and regimen due to unspecified reason: Secondary | ICD-10-CM

## 2012-08-24 DIAGNOSIS — Z9119 Patient's noncompliance with other medical treatment and regimen: Secondary | ICD-10-CM

## 2012-08-24 DIAGNOSIS — G2581 Restless legs syndrome: Secondary | ICD-10-CM | POA: Diagnosis present

## 2012-08-24 LAB — URINALYSIS, ROUTINE W REFLEX MICROSCOPIC
Bilirubin Urine: NEGATIVE
Glucose, UA: NEGATIVE mg/dL
Ketones, ur: NEGATIVE mg/dL
Leukocytes, UA: NEGATIVE
Protein, ur: NEGATIVE mg/dL

## 2012-08-24 LAB — CBC
MCH: 28.7 pg (ref 26.0–34.0)
MCHC: 34.8 g/dL (ref 30.0–36.0)
Platelets: 210 10*3/uL (ref 150–400)
RBC: 5.05 MIL/uL (ref 3.87–5.11)
RDW: 15 % (ref 11.5–15.5)

## 2012-08-24 LAB — RAPID URINE DRUG SCREEN, HOSP PERFORMED
Amphetamines: NOT DETECTED
Barbiturates: NOT DETECTED
Benzodiazepines: NOT DETECTED
Cocaine: NOT DETECTED

## 2012-08-24 LAB — DIFFERENTIAL
Basophils Absolute: 0 10*3/uL (ref 0.0–0.1)
Basophils Relative: 0 % (ref 0–1)
Eosinophils Absolute: 0.2 10*3/uL (ref 0.0–0.7)
Neutrophils Relative %: 56 % (ref 43–77)

## 2012-08-24 LAB — COMPREHENSIVE METABOLIC PANEL
ALT: 16 U/L (ref 0–35)
Albumin: 3.4 g/dL — ABNORMAL LOW (ref 3.5–5.2)
Alkaline Phosphatase: 124 U/L — ABNORMAL HIGH (ref 39–117)
Potassium: 3.6 mEq/L (ref 3.5–5.1)
Sodium: 134 mEq/L — ABNORMAL LOW (ref 135–145)
Total Protein: 7 g/dL (ref 6.0–8.3)

## 2012-08-24 LAB — PROTIME-INR
INR: 1.02 (ref 0.00–1.49)
Prothrombin Time: 13.2 seconds (ref 11.6–15.2)

## 2012-08-24 LAB — POCT I-STAT, CHEM 8
Calcium, Ion: 1.12 mmol/L — ABNORMAL LOW (ref 1.13–1.30)
Chloride: 101 mEq/L (ref 96–112)
Creatinine, Ser: 0.7 mg/dL (ref 0.50–1.10)
Glucose, Bld: 154 mg/dL — ABNORMAL HIGH (ref 70–99)
HCT: 44 % (ref 36.0–46.0)

## 2012-08-24 MED ORDER — ALBUTEROL SULFATE HFA 108 (90 BASE) MCG/ACT IN AERS
2.0000 | INHALATION_SPRAY | Freq: Every day | RESPIRATORY_TRACT | Status: DC
  Filled 2012-08-24: qty 6.7

## 2012-08-24 MED ORDER — SODIUM CHLORIDE 0.9 % IJ SOLN
3.0000 mL | INTRAMUSCULAR | Status: DC | PRN
  Administered 2012-08-25: 3 mL via INTRAVENOUS

## 2012-08-24 MED ORDER — SODIUM CHLORIDE 0.9 % IV SOLN: 250.0000 mL | INTRAVENOUS | Status: DC | PRN

## 2012-08-24 MED ORDER — SODIUM CHLORIDE 0.9 % IJ SOLN
3.0000 mL | Freq: Two times a day (BID) | INTRAMUSCULAR | Status: DC
  Administered 2012-08-24 – 2012-08-26 (×2): 3 mL via INTRAVENOUS

## 2012-08-24 MED ORDER — DULOXETINE HCL 60 MG PO CPEP
60.0000 mg | ORAL_CAPSULE | Freq: Every day | ORAL | Status: DC
  Administered 2012-08-24 – 2012-08-26 (×3): 60 mg via ORAL
  Filled 2012-08-24 (×3): qty 1

## 2012-08-24 MED ORDER — SODIUM CHLORIDE 0.9 % IJ SOLN
3.0000 mL | Freq: Two times a day (BID) | INTRAMUSCULAR | Status: DC
  Administered 2012-08-25 (×2): 3 mL via INTRAVENOUS

## 2012-08-24 MED ORDER — NICOTINE 21 MG/24HR TD PT24
21.0000 mg | MEDICATED_PATCH | Freq: Every day | TRANSDERMAL | Status: DC
  Administered 2012-08-24 – 2012-08-26 (×3): 21 mg via TRANSDERMAL
  Filled 2012-08-24 (×3): qty 1

## 2012-08-24 MED ORDER — SODIUM CHLORIDE 0.9 % IV SOLN: INTRAVENOUS | Status: DC

## 2012-08-24 MED ORDER — OXYCODONE HCL 5 MG PO TABS
2.5000 mg | ORAL_TABLET | ORAL | Status: DC | PRN
  Administered 2012-08-24: 2.5 mg via ORAL
  Filled 2012-08-24: qty 2

## 2012-08-24 MED ORDER — LORAZEPAM 2 MG/ML IJ SOLN
1.0000 mg | Freq: Once | INTRAMUSCULAR | Status: AC
  Administered 2012-08-24: 1 mg via INTRAVENOUS
  Filled 2012-08-24: qty 1

## 2012-08-24 MED ORDER — ASPIRIN EC 81 MG PO TBEC
81.0000 mg | DELAYED_RELEASE_TABLET | Freq: Every day | ORAL | Status: DC
  Administered 2012-08-24 – 2012-08-26 (×3): 81 mg via ORAL
  Filled 2012-08-24 (×3): qty 1

## 2012-08-24 MED ORDER — PANTOPRAZOLE SODIUM 40 MG PO TBEC
40.0000 mg | DELAYED_RELEASE_TABLET | Freq: Every day | ORAL | Status: DC
  Administered 2012-08-24 – 2012-08-26 (×3): 40 mg via ORAL
  Filled 2012-08-24 (×3): qty 1

## 2012-08-24 MED ORDER — HEPARIN SODIUM (PORCINE) 5000 UNIT/ML IJ SOLN
5000.0000 [IU] | Freq: Three times a day (TID) | INTRAMUSCULAR | Status: DC
  Administered 2012-08-25 – 2012-08-26 (×4): 5000 [IU] via SUBCUTANEOUS
  Filled 2012-08-24 (×8): qty 1

## 2012-08-24 MED ORDER — OXYCODONE-ACETAMINOPHEN 5-325 MG PO TABS
1.0000 | ORAL_TABLET | ORAL | Status: DC | PRN
  Administered 2012-08-24: 1 via ORAL
  Filled 2012-08-24: qty 1

## 2012-08-24 MED ORDER — LORAZEPAM 2 MG/ML IJ SOLN
INTRAMUSCULAR | Status: AC
  Filled 2012-08-24: qty 1

## 2012-08-24 MED ORDER — ROPINIROLE HCL 1 MG PO TABS
1.0000 mg | ORAL_TABLET | Freq: Four times a day (QID) | ORAL | Status: DC
  Administered 2012-08-24 – 2012-08-26 (×5): 1 mg via ORAL
  Filled 2012-08-24 (×9): qty 1

## 2012-08-24 NOTE — Consult Note (Signed)
NEURO HOSPITALIST CONSULT NOTE    Reason for Consult:Left hand weakness  HPI:                                                                                                                                          [name] is an 62 y.o. female with history of HTN who presented to the ED due to patient complaining of slurred speech and weakness on left hand upon waking up.  Patient has a history of seizure disorder which she states she has seen [facility] at some point in time.  She states she "takes seizure medication once in a while".  Husband states she has had EEG's in which seizures were captured but showed no seizures. On consult patient will not fully engage and is easily distracted.  She tells me she has had left arm and face tingling and weakness "for a few months now".  On exam patient is very inconsistent one point telling me she cannot lift her left arm but just after using her left arm to elevate her right arm. She prefers to lay on her right side due to left leg discomfort and is continuously moving her legs due to her RLS (per husband).   Past Medical History  Diagnosis Date  . Hypertension     History reviewed. No pertinent past surgical history.  Family History  Problem Relation Age of Onset  . Hyperlipidemia Mother   . Hyperlipidemia Father   . Hypertension Mother   . Hypertension Father     Social History:  reports that she has been smoking.  She does not have any smokeless tobacco history on file. She reports that she does not drink alcohol or use illicit drugs.  Allergies  Allergen Reactions  . Codeine Other (See Comments)    Reaction unknown  . Sulfonamide Derivatives Other (See Comments)    Reaction unknown    MEDICATIONS:                                                                                                                     No current facility-administered medications for this encounter.   Current Outpatient  Prescriptions  Medication Sig Dispense Refill  . albuterol (PROVENTIL HFA;VENTOLIN HFA) 108 (90 BASE) MCG/ACT inhaler Inhale 2 puffs  into the lungs at bedtime.      . DULoxetine (CYMBALTA) 60 MG capsule Take 60 mg by mouth daily.      Marland Kitchen estradiol (ESTRACE) 1 MG tablet Take 1 mg by mouth daily.      . hydrochlorothiazide (HYDRODIURIL) 25 MG tablet Take 25 mg by mouth daily.      Marland Kitchen oxyCODONE-acetaminophen (PERCOCET) 7.5-325 MG per tablet Take 1 tablet by mouth every 4 (four) hours as needed. For pain      . pantoprazole (PROTONIX) 40 MG tablet Take 40 mg by mouth daily.      Marland Kitchen rOPINIRole (REQUIP) 1 MG tablet Take 1 mg by mouth 4 (four) times daily.          ROS:                                                                                                                                       History obtained from the patient  General ROS: negative for - chills, fatigue, fever, night sweats, weight gain or weight loss Psychological ROS: negative for - behavioral disorder, hallucinations, memory difficulties, mood swings or suicidal ideation Ophthalmic ROS: negative for - blurry vision, double vision, eye pain or loss of vision ENT ROS: negative for - epistaxis, nasal discharge, oral lesions, sore throat, tinnitus or vertigo Allergy and Immunology ROS: negative for - hives or itchy/watery eyes Hematological and Lymphatic ROS: negative for - bleeding problems, bruising or swollen lymph nodes Endocrine ROS: negative for - galactorrhea, hair pattern changes, polydipsia/polyuria or temperature intolerance Respiratory ROS: negative for - cough, hemoptysis, shortness of breath or wheezing Cardiovascular ROS: negative for - chest pain, dyspnea on exertion, edema or irregular heartbeat Gastrointestinal ROS: negative for - abdominal pain, diarrhea, hematemesis, nausea/vomiting or stool incontinence Genito-Urinary ROS: negative for - dysuria, hematuria, incontinence or urinary  frequency/urgency Musculoskeletal ROS: negative for - joint swelling or muscular weakness Neurological ROS: as noted in HPI Dermatological ROS: negative for rash and skin lesion changes   Blood pressure 159/69, pulse 92, temperature 97.9 F (36.6 C), temperature source Oral, resp. rate 17, SpO2 95.00%.   Neurologic Examination:                                                                                                       Mental Status: Alert, [name] fluent without evidence of aphasia.  Able to follow commands when she will stay focused or desires to follow commands. She is not always cooperative with the exam  Cranial Nerves: II: Discs flat bilaterally; Visual fields grossly normal, pupils equal, round, reactive to light and accommodation III,IV, VI: ptosis not present, extra-ocular motions intact bilaterally V,VII: smile symmetric, facial light touch sensation stated to be decreased on the left but inconsistent VIII: hearing normal bilaterally IX,X: gag reflex present XI: bilateral shoulder shrug XII: midline tongue extension Motor: Right : Upper extremity   5/5    Left:     Upper extremity   5/5 (inconsistant)  Lower extremity   5/5     Lower extremity   5/5 (inconsistant) --during exam patient showed poor effort but when distracted would show full strength.  There was significant give way strength throughout exam bilaterally. She had downward rift without pronation.  Tone and bulk:normal tone throughout; no atrophy noted Sensory: Pinprick and light touch intact throughout, bilaterally Deep Tendon Reflexes:  Right: Upper Extremity   Left: Upper extremity   biceps (C-5 to C-6) 2/4   biceps (C-5 to C-6) 2/4 tricep (C7) 2/4    triceps (C7) 2/4 Brachioradialis (C6) 2/4  Brachioradialis (C6) 2/4  Lower Extremity Lower Extremity  quadriceps (L-2 to L-4) 2/4   quadriceps (L-2 to L-4) 2/4 Achilles (S1) 2/4   Achilles (S1) 2/4  Plantars: Right: downgoing   Left:  downgoing Cerebellar: distractable tremor on finger nose finger,  normal heel-to-shin test CV: pulses palpable throughout   No components found with this basename: cbc,  bmp,  coags,  chol,  tri,  ldl,  hga1c    Results for orders placed during the hospital encounter of 08/24/12 (from the past 48 hour(s))  PROTIME-INR     Status: None   Collection Time    08/24/12 12:51 PM      Result Value Range   Prothrombin Time 13.2  11.6 - 15.2 seconds   INR 1.02  0.00 - 1.49  APTT     Status: None   Collection Time    08/24/12 12:51 PM      Result Value Range   aPTT 34  24 - 37 seconds  CBC     Status: None   Collection Time    08/24/12 12:51 PM      Result Value Range   WBC 6.6  4.0 - 10.5 K/uL   RBC 5.05  3.87 - 5.11 MIL/uL   Hemoglobin 14.5  12.0 - 15.0 g/dL   HCT 21.3  08.6 - 57.8 %   MCV 82.6  78.0 - 100.0 fL   MCH 28.7  26.0 - 34.0 pg   MCHC 34.8  30.0 - 36.0 g/dL   RDW 46.9  62.9 - 52.8 %   Platelets 210  150 - 400 K/uL  DIFFERENTIAL     Status: None   Collection Time    08/24/12 12:51 PM      Result Value Range   Neutrophils Relative % 56  43 - 77 %   Neutro Abs 3.7  1.7 - 7.7 K/uL   Lymphocytes Relative 36  12 - 46 %   Lymphs Abs 2.4  0.7 - 4.0 K/uL   Monocytes Relative 5  3 - 12 %   Monocytes Absolute 0.3  0.1 - 1.0 K/uL   Eosinophils Relative 3  0 - 5 %   Eosinophils Absolute 0.2  0.0 - 0.7 K/uL   Basophils Relative 0  0 - 1 %   Basophils Absolute 0.0  0.0 - 0.1 K/uL  GLUCOSE, CAPILLARY     Status: Abnormal   Collection Time  08/24/12  1:16 PM      Result Value Range   Glucose-Capillary 169 (*) 70 - 99 mg/dL  POCT I-STAT TROPONIN I     Status: None   Collection Time    08/24/12  1:47 PM      Result Value Range   Troponin i, poc 0.00  0.00 - 0.08 ng/mL   Comment 3            Comment: Due to the release kinetics of cTnI,     a negative result within the first hours     of the onset of symptoms does not rule out     myocardial infarction with certainty.      If myocardial infarction is still suspected,     repeat the test at appropriate intervals.  POCT I-STAT, CHEM 8     Status: Abnormal   Collection Time    08/24/12  1:49 PM      Result Value Range   Sodium 137  135 - 145 mEq/L   Potassium 3.5  3.5 - 5.1 mEq/L   Chloride 101  96 - 112 mEq/L   BUN 7  6 - 23 mg/dL   Creatinine, Ser 4.54  0.50 - 1.10 mg/dL   Glucose, Bld 098 (*) 70 - 99 mg/dL   Calcium, Ion 1.19 (*) 1.13 - 1.30 mmol/L   TCO2 25  0 - 100 mmol/L   Hemoglobin 15.0  12.0 - 15.0 g/dL   HCT 14.7  82.9 - 56.2 %  URINALYSIS, ROUTINE W REFLEX MICROSCOPIC     Status: None   Collection Time    08/24/12  2:09 PM      Result Value Range   Color, Urine YELLOW  YELLOW   APPearance CLEAR  CLEAR   Specific Gravity, Urine 1.007  1.005 - 1.030   pH 7.0  5.0 - 8.0   Glucose, UA NEGATIVE  NEGATIVE mg/dL   Hgb urine dipstick NEGATIVE  NEGATIVE   Bilirubin Urine NEGATIVE  NEGATIVE   Ketones, ur NEGATIVE  NEGATIVE mg/dL   Protein, ur NEGATIVE  NEGATIVE mg/dL   Urobilinogen, UA 1.0  0.0 - 1.0 mg/dL   Nitrite NEGATIVE  NEGATIVE   Leukocytes, UA NEGATIVE  NEGATIVE   Comment: MICROSCOPIC NOT DONE ON URINES WITH NEGATIVE PROTEIN, BLOOD, LEUKOCYTES, NITRITE, OR GLUCOSE <1000 mg/dL.    Ct Head Wo Contrast  08/24/2012   *RADIOLOGY REPORT*  Clinical Data: Sided weakness and arm drift on the left; slurred speech  CT HEAD WITHOUT CONTRAST  Technique:  Contiguous axial images were obtained from the base of the skull through the vertex without contrast. Study was obtained within 24 hours of patient arrival at the emergency department.  Comparison: July 31, 2011  Findings: The ventricles are normal in size and configuration. There is no mass, hemorrhage, extra-axial fluid collection, or midline shift.  There is patchy small vessel disease in the centra semiovale  bilaterally.  No acute appearing infarct is appreciated on this study. Bony calvarium appears intact.  The mastoid air cells are clear.   IMPRESSION:  Mild patchy supratentorial small vessel disease.  No demonstrable mass, hemorrhage, or acute appearing infarct.   Original Report Authenticated By: Bretta Bang, M.D.     Felicie Morn PA-C [facility] [phone]  08/24/2012, 4:05 PM   Assessment/Plan:  62 year old female with multiple findings on her exam concerning for psychogenic etiology to her current weakness. I did not witness any of her "seizures" however the history that  she had spells during EEG in the past with no seizure recorded would suggest a psychogenic nature to these events as well. Unfortunately, but able to obtain these records tonight but will attempt to obtain them tomorrow.  1) MRI brain 2) EEG 3) if these are normal, then I feel that psychogenic etiology would be most likely.   Ritta Slot, MD [facility] [phone]  If 7pm- 7am, please page neurology on call at [phone].

## 2012-08-24 NOTE — ED Notes (Signed)
Pt. Stated, i got this thing with my mouth ok last night, this morning it was slurred.? Arm drift on the left side. Alert and oriented x 3. Smile is NOT symmetrical, slurred speech. Pt. Has history of Mini strokes.

## 2012-08-24 NOTE — Progress Notes (Addendum)
Patient is refusing MRI at this time. MRI and Dr. Roseanne Reno aware. Patient also refusing heparin and IV fluids.

## 2012-08-24 NOTE — ED Provider Notes (Signed)
CSN: [id number]     Arrival date & time 08/24/12  1231 History     First MD Initiated Contact with Patient 08/24/12 1243     Chief Complaint  Patient presents with  . Aphasia   (Consider location/radiation/quality/duration/timing/severity/associated sxs/prior Treatment) Patient is a 62 y.o. female presenting with weakness. The history is provided by the patient.  Weakness This is a new problem. Episode onset: when she woke up. The problem occurs constantly. The problem has been gradually worsening. Pertinent negatives include no headaches. Nothing aggravates the symptoms. Nothing relieves the symptoms. She has tried nothing for the symptoms.    Past Medical History  Diagnosis Date  . Hypertension    History reviewed. No pertinent past surgical history. No family history on file. History  Substance Use Topics  . Smoking status: Current Every Day Smoker  . Smokeless tobacco: Not on file  . Alcohol Use: No   OB History   Grav Para Term Preterm Abortions TAB SAB Ect Mult Living                 Review of Systems  Constitutional: Negative for fever and chills.  Gastrointestinal: Negative for nausea and vomiting.  Genitourinary: Negative for dysuria.  Musculoskeletal: Positive for gait problem.  Neurological: Positive for speech difficulty, weakness and numbness (facial numbness started last night). Negative for headaches.  All other systems reviewed and are negative.    Allergies  Codeine and Sulfonamide derivatives  Home Medications   Current Outpatient Rx  Name  Route  Sig  Dispense  Refill  . albuterol (PROVENTIL HFA;VENTOLIN HFA) 108 (90 BASE) MCG/ACT inhaler   Inhalation   Inhale 2 puffs into the lungs at bedtime.         Marland Kitchen dexlansoprazole (DEXILANT) 60 MG capsule   Oral   Take 60 mg by mouth daily.         . DULoxetine (CYMBALTA) 60 MG capsule   Oral   Take 60 mg by mouth daily.         . hydrochlorothiazide (HYDRODIURIL) 25 MG tablet   Oral  Take 25 mg by mouth daily.         Marland Kitchen oxyCODONE-acetaminophen (PERCOCET) 7.5-325 MG per tablet   Oral   Take 1 tablet by mouth every 4 (four) hours as needed. For pain         . pantoprazole (PROTONIX) 40 MG tablet   Oral   Take 40 mg by mouth daily.         Marland Kitchen rOPINIRole (REQUIP) 0.5 MG tablet   Oral   Take 0.5 mg by mouth 3 (three) times daily.          BP 163/122  Pulse 106  Temp(Src) 98.2 F (36.8 C) (Oral)  Resp 22  SpO2 95% Physical Exam  Vitals reviewed. Constitutional: She is oriented to person, place, and time. She appears well-developed and well-nourished.  HENT:  Head: Normocephalic and atraumatic.  Right Ear: External ear normal.  Left Ear: External ear normal.  Nose: Nose normal.  Eyes: Right eye exhibits no discharge. Left eye exhibits no discharge.  Cardiovascular: Normal rate, regular rhythm and normal heart sounds.   Pulmonary/Chest: Effort normal and breath sounds normal.  Abdominal: Soft. There is no tenderness.  Neurological: She is alert and oriented to person, place, and time.  Marked left sided weakness (upper and lower extremity). Facial droop also noted.  Skin: Skin is warm and dry.    ED Course   Procedures (including  critical care time)  Labs Reviewed  GLUCOSE, CAPILLARY - Abnormal; Notable for the following:    Glucose-Capillary 169 (*)    All other components within normal limits  POCT I-STAT, CHEM 8 - Abnormal; Notable for the following:    Glucose, Bld 154 (*)    Calcium, Ion 1.12 (*)    All other components within normal limits  PROTIME-INR  APTT  CBC  DIFFERENTIAL  URINALYSIS, ROUTINE W REFLEX MICROSCOPIC  ETHANOL  COMPREHENSIVE METABOLIC PANEL  TROPONIN I  URINE RAPID DRUG SCREEN (HOSP PERFORMED)  POCT I-STAT TROPONIN I    Date: 08/24/2012  Rate: 108  Rhythm: sinus tachycardia  QRS Axis: normal  Intervals: normal  ST/T Wave abnormalities: nonspecific T wave changes  Conduction Disutrbances:none  Narrative  Interpretation: Sinus tachycardia w/o acute ischemia  Old EKG Reviewed: unchanged   Ct Head Wo Contrast  08/24/2012   *RADIOLOGY REPORT*  Clinical Data: Sided weakness and arm drift on the left; slurred speech  CT HEAD WITHOUT CONTRAST  Technique:  Contiguous axial images were obtained from the base of the skull through the vertex without contrast. Study was obtained within 24 hours of patient arrival at the emergency department.  Comparison: July 31, 2011  Findings: The ventricles are normal in size and configuration. There is no mass, hemorrhage, extra-axial fluid collection, or midline shift.  There is patchy small vessel disease in the centra semiovale  bilaterally.  No acute appearing infarct is appreciated on this study. Bony calvarium appears intact.  The mastoid air cells are clear.  IMPRESSION:  Mild patchy supratentorial small vessel disease.  No demonstrable mass, hemorrhage, or acute appearing infarct.   Original Report Authenticated By: Bretta Bang, M.D.   1. Left-sided weakness     MDM  12:51 PM her strokelike symptoms started last night with some numbness. She went to bed with no weakness, when she woke up she had facial droop and left-sided weakness that has been progressing. Due to her numbness-like symptoms starting over 12 hours ago and her having woken up with weakness she is not a candidate to be a code stroke.  Patient intermittently had brief seizure like/shaking episodes while in ED that terminated spontaneously. Husband and patient relate these are c/w episodes at home. Given ativan here to help prevent further seizures. CT head benign, and patient still has weakness so neuro consulted and internal medicine will admit for CVA w/u.  Audree Camel, MD 08/24/12 2007

## 2012-08-24 NOTE — H&P (Signed)
Date: 08/24/2012               Patient Name:  [name] MRN: [id number]  DOB: Apr 12, 1950 Age / Sex: 62 y.o., female   PCP: Raliegh Ip, MD         Medical Service: Internal Medicine Teaching Service         Attending Physician: Dr. Judyann Munson, MD    First Contact: Dr. Yetta Barre Pager: [phone]  Second Contact: Dr. Clyde Lundborg Pager: [phone]       After Hours (After 5p/  First Contact Pager: [phone]  weekends / holidays): Second Contact Pager: [phone]   Chief Complaint: Slurred speech, L. Hand weakness, facial numbness  History of Present Illness:  [name] is a 62 y.o. female w/ PMHx of possible seizure disorder (non-compliant w/ meds), and HTN, presents to the ED w/ complaints of slurred speech, L hand weakness, and facial numbness and facial droop reported by her husband. The patient reports her facial numbness started yesterday morning and involved her mouth and cheeks. She also reports left hand weakness, but after more history, this is not a new/acute problem. Her husband also reports that her face started to "droop" on the right side some time this AM resulting in slurred speech. She was not having issues with finding words, but more so with pronunciation 2/2 her facial weakness. This, in addition to the facial numbness is a new finding for her. Both the patient and her husband are relatively poor historians. The husband also describes many recent "seizures" where his wife will begin "convusling" when she is standing and will slowly sit down by herself or with assistance. She denies any LOC, voiding of bowel or bladder, tongue biting, or falls resulting in head injury or trauma. According to the patient, she used to take medications for her seizures, but does not take them anymore because they made her feel "drunk".  On examination in the ED, the patient did not exhibit any facial droop or difficulty speaking. She did have left hand weakness, but this is  not a new issue. No CN abnormalities.  She otherwise denies any recent SOB, chest pain, nausea, vomiting, lightheadedness, dizziness, clotting issues, or bleeding problems. CT head in the ED shows no acute intracranial abnormality.  Meds: Current Facility-Administered Medications  Medication Dose Route Frequency Provider Last Rate Last Dose  . 0.9 %  sodium chloride infusion  250 mL Intravenous PRN Lars Masson, MD      . 0.9 %  sodium chloride infusion   Intravenous Continuous Lars Masson, MD      . albuterol (PROVENTIL HFA;VENTOLIN HFA) 108 (90 BASE) MCG/ACT inhaler 2 puff  2 puff Inhalation QHS Lorretta Harp, MD      . aspirin EC tablet 81 mg  81 mg Oral Daily Lars Masson, MD      . DULoxetine (CYMBALTA) DR capsule 60 mg  60 mg Oral Daily Lorretta Harp, MD      . heparin injection 5,000 Units  5,000 Units Subcutaneous Q8H Lars Masson, MD      . nicotine (NICODERM CQ - dosed in mg/24 hours) patch 21 mg  21 mg Transdermal Daily Lorretta Harp, MD      . oxyCODONE-acetaminophen (PERCOCET) 7.5-325 MG per tablet 1 tablet  1 tablet Oral Q4H PRN Lorretta Harp, MD      . pantoprazole (PROTONIX) EC tablet 40 mg  40 mg Oral Daily Lorretta Harp, MD      . rOPINIRole (REQUIP)  tablet 1 mg  1 mg Oral QID Lorretta Harp, MD      . sodium chloride 0.9 % injection 3 mL  3 mL Intravenous Q12H Lars Masson, MD      . sodium chloride 0.9 % injection 3 mL  3 mL Intravenous Q12H Lars Masson, MD      . sodium chloride 0.9 % injection 3 mL  3 mL Intravenous PRN Lars Masson, MD        Allergies: Allergies as of 08/24/2012 - Review Complete 08/24/2012  Allergen Reaction Noted  . Codeine Other (See Comments)   . Sulfonamide derivatives Other (See Comments)    Past Medical History  Diagnosis Date  . Hypertension    History reviewed. No pertinent past surgical history. Family History  Problem Relation Age of Onset  . Hyperlipidemia Mother   . Hyperlipidemia Father   . Hypertension Mother   . Hypertension Father    History   Social  History  . Marital Status: Married    Spouse Name: N/A    Number of Children: N/A  . Years of Education: N/A   Occupational History  . Not on file.   Social History Main Topics  . Smoking status: Current Every Day Smoker  . Smokeless tobacco: Not on file  . Alcohol Use: No  . Drug Use: No  . Sexually Active:    Other Topics Concern  . Not on file   Social History Narrative  . No narrative on file    Review of Systems: General: Denies fever, chills, diaphoresis, appetite change and fatigue.  HEENT: Denies change in vision, eye pain, redness, hearing loss, congestion, sore throat, rhinorrhea, sneezing, mouth sores, trouble swallowing, neck pain, neck stiffness and tinnitus.   Respiratory: Denies SOB, DOE, cough, chest tightness, and wheezing.   Cardiovascular: Denies chest pain, palpitations and leg swelling.  Gastrointestinal: Denies nausea, vomiting, abdominal pain, diarrhea, constipation, blood in stool and abdominal distention.  Genitourinary: Denies dysuria, urgency, frequency, hematuria, flank pain and difficulty urinating.  Endocrine: Denies hot or cold intolerance, sweats, polyuria, polydipsia. Musculoskeletal: Denies myalgias, back pain, joint swelling, arthralgias and gait problem.  Skin: Denies pallor, rash and wounds.  Neurological: Positive for "seizures", left hand weakness, facial numbness, and facial droop. Denies dizziness, syncope, lightheadedness, and headaches.  Hematological: Denies adenopathy,easy bruising, personal or family bleeding history.  Psychiatric/Behavioral: Positive for agitation. Patient has RLS. Denies mood changes, confusion, nervousness, and sleep disturbance.  Physical Exam:  Filed Vitals:   08/24/12 1656 08/24/12 1700 08/24/12 1830 08/24/12 1930  BP: 154/69 153/67 125/71 162/68  Pulse: 98 97 94 98  Temp: 97.6 F (36.4 C)     TempSrc: Oral     Resp: 17 13 13 14   SpO2: 95% 95% 96% 93%   General: Vital signs reviewed.  Patient is a  well-developed and well-nourished, in no acute distress and cooperative with exam. Alert and oriented x3.  Head: Normocephalic and atraumatic. Nose: No erythema or drainage noted.  Turbinates normal. Mouth: No erythema, exudates, sores, or ulcerations. Moist mucus membranes. Eyes: PERRL, EOMI, conjunctivae normal, No scleral icterus.  Neck: Supple, trachea midline, normal ROM, No JVD, masses, thyromegaly, or carotid bruit present.  Cardiovascular: RRR, S1 normal, S2 normal, no murmurs, gallops, or rubs. Pulmonary/Chest: Normal respiratory effort, CTAB, no wheezes, rales, or rhonchi. Abdominal: Soft, non-tender, non-distended, bowel sounds are normal, no masses, organomegaly, or guarding present.  GU: No CVA tenderness. Musculoskeletal: No joint deformities, erythema, or stiffness, ROM full and no nontender. Extremities:  No swelling or edema,  pulses symmetric and intact bilaterally. No cyanosis or clubbing. Neurological: A&O x3, Strength is 4/5 in left hand, 5/5 in right. Strength symmetrical and intact in LE's. Cranial nerve II-XII are grossly intact, no focal motor deficit, sensory intact to light touch bilaterally. No fcial "droop" on exam or slurring of speech present. Skin: Warm, dry and intact. No rashes or erythema. Psychiatric: Normal mood and affect. speech and behavior is normal. Cognition and memory are normal.   Lab results: Basic Metabolic Panel:  Recent Labs  56/21/30 1251 08/24/12 1349  NA 134* 137  K 3.6 3.5  CL 99 101  CO2 25  --   GLUCOSE 152* 154*  BUN 9 7  CREATININE 0.60 0.70  CALCIUM 9.1  --    Liver Function Tests:  Recent Labs  08/24/12 1251  AST 19  ALT 16  ALKPHOS 124*  BILITOT 0.2*  PROT 7.0  ALBUMIN 3.4*   No results found for this basename: LIPASE, AMYLASE,  in the last 72 hours No results found for this basename: AMMONIA,  in the last 72 hours CBC:  Recent Labs  08/24/12 1251 08/24/12 1349  WBC 6.6  --   NEUTROABS 3.7  --   HGB 14.5  15.0  HCT 41.7 44.0  MCV 82.6  --   PLT 210  --    Cardiac Enzymes:  Recent Labs  08/24/12 1252  TROPONINI <0.30   BNP: No results found for this basename: PROBNP,  in the last 72 hours D-Dimer: No results found for this basename: DDIMER,  in the last 72 hours CBG:  Recent Labs  08/24/12 1316  GLUCAP 169*   Coagulation:  Recent Labs  08/24/12 1251  LABPROT 13.2  INR 1.02   Urine Drug Screen: Drugs of Abuse     Component Value Date/Time   LABOPIA NONE DETECTED 08/24/2012 1409   COCAINSCRNUR NONE DETECTED 08/24/2012 1409   LABBENZ NONE DETECTED 08/24/2012 1409   AMPHETMU NONE DETECTED 08/24/2012 1409   THCU NONE DETECTED 08/24/2012 1409   LABBARB NONE DETECTED 08/24/2012 1409    Alcohol Level:  Recent Labs  08/24/12 1251  ETH <11   Urinalysis:  Recent Labs  08/24/12 1409  COLORURINE YELLOW  LABSPEC 1.007  PHURINE 7.0  GLUCOSEU NEGATIVE  HGBUR NEGATIVE  BILIRUBINUR NEGATIVE  KETONESUR NEGATIVE  PROTEINUR NEGATIVE  UROBILINOGEN 1.0  NITRITE NEGATIVE  LEUKOCYTESUR NEGATIVE   Imaging results:  Ct Head Wo Contrast  08/24/2012   *RADIOLOGY REPORT*  Clinical Data: Sided weakness and arm drift on the left; slurred speech  CT HEAD WITHOUT CONTRAST  Technique:  Contiguous axial images were obtained from the base of the skull through the vertex without contrast. Study was obtained within 24 hours of patient arrival at the emergency department.  Comparison: July 31, 2011  Findings: The ventricles are normal in size and configuration. There is no mass, hemorrhage, extra-axial fluid collection, or midline shift.  There is patchy small vessel disease in the centra semiovale  bilaterally.  No acute appearing infarct is appreciated on this study. Bony calvarium appears intact.  The mastoid air cells are clear.  IMPRESSION:  Mild patchy supratentorial small vessel disease.  No demonstrable mass, hemorrhage, or acute appearing infarct.   Original Report Authenticated By:  Bretta Bang, M.D.   Other results: EKG: Sinus tachycardia @ 108 bpm w/ RAE seen in lead II.  Assessment & Plan by Problem:  #Left hand weakness-  62 y.o. female w/ PMHx of  possible seizure disorder (non-compliant w/ meds), and HTN, presents to the ED w/ complaints of slurred speech, L hand weakness, and facial numbness and facial droop reported by her husband. The patient reports her facial numbness started yesterday morning and involved her mouth and cheeks. She also reports left hand weakness, but after more history, this is not a new/acute problem. Her husband also reports that her face started to "droop" on the right side some time this AM resulting in slurred speech. On examination, no abnormalities were seen except for L hand weakness. Facial droop and slurring of speech not present on exam. Seen by neurology, suspect psychogenic issues at this time. TIA/CVA unlikely at this time. Neurology ordering MRI and EEG to r/o possible intracranial abnormalities. CT head in ED shows no acute intracranial abnormalities. -Keep NPO for now. Swallow eval for AM. -Started on ASA 81 mg given normal CT head. -Started on NS 75 ml/hr -Patient on telemetry -Neuro and neurovascular checks q4h -Perform orthostatic vitals to r/o orthostatic changes associated with her findings.  -Patient placed under fall and seizure precautions. -Lipid panel, TSH, HbA1c pending  #HTN- Patient takes HCTZ 25 mg at home. Hold for now given possible TIA/CVA symptoms. -Consider restarting after MRI r/o intracranial abnormality.  #Anxiety/RLS- Restarted home medications; cymbalta 60 mg qd, ropinirole 1 mg qid  #DVT/PE PPx- Heparin  Dispo: Disposition is deferred at this time, awaiting improvement of current medical problems. Anticipated discharge in approximately 1-2 day(s).   The patient does have a current PCP Raliegh Ip, MD) and does not need an [facility] hospital follow-up appointment after discharge.  The  patient does not have transportation limitations that hinder transportation to clinic appointments.  Signed: Lars Masson, MD 08/24/2012, 8:22 PM  Pager: [phone]

## 2012-08-24 NOTE — ED Notes (Signed)
Report attempted 

## 2012-08-24 NOTE — ED Notes (Signed)
Pt ambulated to the bathroom with assist from husband without any difficulty and requesting to walk around the unit.

## 2012-08-24 NOTE — Progress Notes (Signed)
Pt was sent for to come down for her MRI ; RN called and pt is refusing her MRI. Please let us know if we can be of further assistance to this pt. Haze Justin ext [phone]

## 2012-08-25 ENCOUNTER — Inpatient Hospital Stay (HOSPITAL_COMMUNITY): Payer: Medicare PPO

## 2012-08-25 DIAGNOSIS — F411 Generalized anxiety disorder: Secondary | ICD-10-CM

## 2012-08-25 DIAGNOSIS — R209 Unspecified disturbances of skin sensation: Secondary | ICD-10-CM

## 2012-08-25 DIAGNOSIS — M6281 Muscle weakness (generalized): Secondary | ICD-10-CM

## 2012-08-25 LAB — LIPID PANEL
LDL Cholesterol: 96 mg/dL (ref 0–99)
VLDL: 28 mg/dL (ref 0–40)

## 2012-08-25 LAB — CBC
HCT: 42.1 % (ref 36.0–46.0)
Hemoglobin: 14 g/dL (ref 12.0–15.0)
MCHC: 33.3 g/dL (ref 30.0–36.0)
MCV: 84.4 fL (ref 78.0–100.0)
RDW: 15.2 % (ref 11.5–15.5)

## 2012-08-25 LAB — BASIC METABOLIC PANEL
BUN: 9 mg/dL (ref 6–23)
CO2: 27 mEq/L (ref 19–32)
Chloride: 98 mEq/L (ref 96–112)
Creatinine, Ser: 0.73 mg/dL (ref 0.50–1.10)
Glucose, Bld: 119 mg/dL — ABNORMAL HIGH (ref 70–99)
Potassium: 3.4 mEq/L — ABNORMAL LOW (ref 3.5–5.1)

## 2012-08-25 LAB — HEMOGLOBIN A1C: Hgb A1c MFr Bld: 6.5 % — ABNORMAL HIGH (ref <5.7)

## 2012-08-25 MED ORDER — SODIUM CHLORIDE 0.9 % IV SOLN
1000.0000 mg | INTRAVENOUS | Status: AC
Start: 2012-08-25 — End: 2012-08-25
  Administered 2012-08-25: 1000 mg via INTRAVENOUS
  Filled 2012-08-25: qty 10

## 2012-08-25 MED ORDER — HYDROCHLOROTHIAZIDE 25 MG PO TABS
25.0000 mg | ORAL_TABLET | Freq: Every day | ORAL | Status: DC
  Administered 2012-08-25 – 2012-08-26 (×2): 25 mg via ORAL
  Filled 2012-08-25 (×2): qty 1

## 2012-08-25 MED ORDER — LEVETIRACETAM 500 MG PO TABS
500.0000 mg | ORAL_TABLET | Freq: Two times a day (BID) | ORAL | Status: DC
  Administered 2012-08-25 – 2012-08-26 (×2): 500 mg via ORAL
  Filled 2012-08-25 (×3): qty 1

## 2012-08-25 NOTE — Progress Notes (Signed)
   CARE MANAGEMENT NOTE 08/25/2012  Patient:  [name], [name]   Account Number:  [id number]  Date Initiated:  08/25/2012  Documentation initiated by:  Jiles Crocker  Subjective/Objective Assessment:   ADMITTED WITH LEFT HAND WEAKNESS     Action/Plan:   PCP:  Raliegh Ip, MD  CM FOLLOWING FOR DCP   Anticipated DC Date:  09/01/2012   Anticipated DC Plan:  HOME/SELF CARE      DC Planning Services  CM consult        Status of service:  In process, will continue to follow Medicare Important Message given?  NA - LOS <3 / Initial given by admissions (If response is "NO", the following Medicare IM given date fields will be blank)   Per UR Regulation:  Reviewed for med. necessity/level of care/duration of stay  Comments:  08/25/2012- B Partick Musselman RN,BSN,MHA

## 2012-08-25 NOTE — Progress Notes (Signed)
Patient currently with no symptoms and very steady on her feet, however bed alarm still required per protocol for poss history of seizures. Patient alert and oriented but refusing bed alarm. She stated she understood why we had them but she will not have one on in her room. Per night shift, she refused this then as well  Minor, Yvette Rack

## 2012-08-25 NOTE — Evaluation (Signed)
Physical Therapy Evaluation Patient Details Name: [name] MRN: [id number] DOB: [birth year] Today's Date: 08/25/2012 Time: 2130-8657    PT Time Calculation (min): 22 min  PT Assessment / Plan / Recommendation History of Present Illness  [name] is a 62 y.o. female w/ PMHx of possible seizure disorder (non-compliant w/ meds), and HTN, presents to the ED w/ complaints of slurred speech, L hand weakness, and facial numbness and facial droop reported by her husband. The patient reports her facial numbness started yesterday morning and involved her mouth and cheeks. She also reports left hand weakness, but after more history, this is not a new/acute problem. Her husband also reports that her face started to "droop" on the right side some time this AM resulting in slurred speech. She was not having issues with finding words, but more so with pronunciation 2/2 her facial weakness. This, in addition to the facial numbness is a new finding for her. Both the patient and her husband are relatively poor historians. The husband also describes many recent "seizures" where his wife will begin "convusling" when she is standing and will slowly sit down by herself or with assistance. She denies any LOC, voiding of bowel or bladder, tongue biting, or falls resulting in head injury or trauma. According to the patient, she used to take medications for her seizures, but does not take them anymore because they made her feel "drunk".    Clinical Impression  Pt admitted with above concerns.  Pt currently with functional limitations due to the deficits listed below (see PT Problem List). Today, on eval, unable to get a good picture of pt due to her lethargy.   Pt will benefit from skilled PT to increase their independence and safety with mobility to allow discharge to the venue listed below.       PT Assessment  Patient needs continued PT services    Follow Up Recommendations  Other (comment)  (pt could benefit from PT but refuses)    Does the patient have the potential to tolerate intense rehabilitation      Barriers to Discharge        Equipment Recommendations  None recommended by PT    Recommendations for Other Services     Frequency Min 3X/week    Precautions / Restrictions Precautions Precautions: Fall Precaution Comments: Today pt is a fall risk, but husband states she is much different due to being sleepy . Restrictions Weight Bearing Restrictions: No   Pertinent Vitals/Pain       Mobility  Bed Mobility Bed Mobility: Rolling Left;Left Sidelying to Sit;Sit to Supine;Sitting - Scoot to Edge of Bed Rolling Left: 4: Min guard;With rail Left Sidelying to Sit: 4: Min guard;With rails Sitting - Scoot to Edge of Bed: 4: Min assist Sit to Supine: 4: Min guard Details for Bed Mobility Assistance: pt acting lethargic and "drunk-like", but able to mobilize with little assist Transfers Transfers: Sit to Stand;Stand to Sit Sit to Stand: 4: Min assist;With upper extremity assist;From bed Stand to Sit: 4: Min assist;With upper extremity assist;To bed Details for Transfer Assistance: steady assist due to lethargy Ambulation/Gait Ambulation/Gait Assistance: 4: Min assist Ambulation Distance (Feet): 100 Feet Assistive device: 1 person hand held assist;Other (Comment) (holding to IV pole) Ambulation/Gait Assistance Details: variably unsteady based on her ability to focus due to sleepiness Gait Pattern: Scissoring;Step-through pattern Stairs: Yes Stairs Assistance: 3: Mod assist Stairs Assistance Details (indicate cue type and reason): really unsafe due to pt unable to concentrate.  Stair Management Technique: One rail Left;Step to pattern;Forwards Number of Stairs: 3    Exercises     PT Diagnosis: Difficulty walking  PT Problem List: Decreased strength;Decreased balance;Decreased mobility;Decreased knowledge of precautions PT Treatment Interventions: Gait  training;Functional mobility training;Therapeutic activities;Patient/family education;Balance training     PT Goals(Current goals can be found in the care plan section) Acute Rehab PT Goals Patient Stated Goal: no goal stated PT Goal Formulation: With patient Time For Goal Achievement: 09/01/12 Potential to Achieve Goals: Good  Visit Information  Last PT Received On: 08/25/12 Assistance Needed: +1 History of Present Illness: [name] is a 62 y.o. female w/ PMHx of possible seizure disorder (non-compliant w/ meds), and HTN, presents to the ED w/ complaints of slurred speech, L hand weakness, and facial numbness and facial droop reported by her husband. The patient reports her facial numbness started yesterday morning and involved her mouth and cheeks. She also reports left hand weakness, but after more history, this is not a new/acute problem. Her husband also reports that her face started to "droop" on the right side some time this AM resulting in slurred speech. She was not having issues with finding words, but more so with pronunciation 2/2 her facial weakness. This, in addition to the facial numbness is a new finding for her. Both the patient and her husband are relatively poor historians. The husband also describes many recent "seizures" where his wife will begin "convusling" when she is standing and will slowly sit down by herself or with assistance. She denies any LOC, voiding of bowel or bladder, tongue biting, or falls resulting in head injury or trauma. According to the patient, she used to take medications for her seizures, but does not take them anymore because they made her feel "drunk".         Prior Functioning  Home Living Family/patient expects to be discharged to:: Private residence Living Arrangements: Spouse/significant other Available Help at Discharge: Family;Other (Comment) (husband, but has had hip surgery lately) Type of Home: House Home Access: Stairs  to enter [employer] of Steps: several Entrance Stairs-Rails: Right;Left Home Layout: One level Prior Function Level of Independence: Independent Communication Communication: No difficulties    Cognition  Cognition Arousal/Alertness: Lethargic Behavior During Therapy: WFL for tasks assessed/performed Overall Cognitive Status: Difficult to assess    Extremity/Trunk Assessment Upper Extremity Assessment Upper Extremity Assessment: Overall WFL for tasks assessed;Generalized weakness (tests weaker than her function would suggest) Lower Extremity Assessment Lower Extremity Assessment: Overall WFL for tasks assessed;Generalized weakness;LLE deficits/detail LLE Deficits / Details: during testing, pt unable to sustain a contraction for more than a few seconds and present weak, but moves better than her MMT would suggest.   Balance Balance Balance Assessed:  (pt unsteady through out, but difficult assess due to letharg)  End of Session PT - End of Session Activity Tolerance: Patient tolerated treatment well Patient left: in bed;with call bell/phone within reach;with family/visitor present Nurse Communication: Mobility status  GP     Nasreen Goedecke, Eliseo Gum 08/25/2012, 4:54 PM 08/25/2012  Clearmont Bing, PT [phone]  (pager)

## 2012-08-25 NOTE — Progress Notes (Signed)
Subjective: [name] is a 62 y.o. female w/ PMHx of possible seizure disorder (non-compliant w/ meds), and HTN, presents to the ED w/ complaints of slurred speech, L hand weakness, and facial numbness and facial droop reported by her husband.   Patient seen at bedside this AM. Feels much better, slept well through the night. Denies any seizures, weakness, LOC, dizziness, lightheadedness, or difficulty with gait. She denies any nausea vomiting, fever, chills, chest pain, or SOB.  She was seen by neurology today. EEG was performed, results pending. Previous EEG results from 2013 were obtained and there did show left temporal sharp waves and seizure activity. Neurology loaded patient w/ 1 g Keppra and started her on 500 mg bid for seizures.  Patient was scheduled for MRI last night to r/o any ischemic issues, but she refused. It was also found that she has a spinal cord stimulator and is not a candidate for MRI given the presence of hardware.   Objective: Vital signs in last 24 hours: Filed Vitals:   08/24/12 2100 08/25/12 0145 08/25/12 0500 08/25/12 0947  BP: 143/92 153/87 180/80 177/90  Pulse: 89 76 88 74  Temp: 97.7 F (36.5 C) 97.3 F (36.3 C) 97.6 F (36.4 C) 97.2 F (36.2 C)  TempSrc: Oral Oral Oral Axillary  Resp: 18 20 20 20   Height: 5\' 2"  (1.575 m)     Weight: 195 lb 1.7 oz (88.5 kg)     SpO2: 97% 93% 97% 98%   Weight change:   Intake/Output Summary (Last 24 hours) at 08/25/12 1527 Last data filed at 08/25/12 1610  Gross per 24 hour  Intake    360 ml  Output      0 ml  Net    360 ml   Physical Exam: General: Alert, cooperative, and in no apparent distress HEENT: Vision grossly intact, oropharynx clear and non-erythematous  Neck: Full range of motion without pain, supple, no lymphadenopathy or carotid bruits Lungs: Clear to ascultation bilaterally, normal work of respiration, no wheezes, rales, ronchi Heart: Regular rate and rhythm, no murmurs,  gallops, or rubs Abdomen: Soft, non-tender, non-distended, normal bowel sounds Extremities: No cyanosis, clubbing, or edema. Weakness in left hand. Neurologic: Alert & oriented X3, cranial nerves II-XII intact, strength grossly intact, sensation intact to light touch  Lab Results: Basic Metabolic Panel:  Recent Labs Lab 08/24/12 1251 08/24/12 1349 08/25/12 0515  NA 134* 137 137  K 3.6 3.5 3.4*  CL 99 101 98  CO2 25  --  27  GLUCOSE 152* 154* 119*  BUN 9 7 9   CREATININE 0.60 0.70 0.73  CALCIUM 9.1  --  9.3   Liver Function Tests:  Recent Labs Lab 08/24/12 1251  AST 19  ALT 16  ALKPHOS 124*  BILITOT 0.2*  PROT 7.0  ALBUMIN 3.4*   No results found for this basename: LIPASE, AMYLASE,  in the last 168 hours No results found for this basename: AMMONIA,  in the last 168 hours CBC:  Recent Labs Lab 08/24/12 1251 08/24/12 1349 08/25/12 0515  WBC 6.6  --  5.7  NEUTROABS 3.7  --   --   HGB 14.5 15.0 14.0  HCT 41.7 44.0 42.1  MCV 82.6  --  84.4  PLT 210  --  203   Cardiac Enzymes:  Recent Labs Lab 08/24/12 1252 08/24/12 2317  TROPONINI <0.30 <0.30   BNP: No results found for this basename: PROBNP,  in the last 168 hours D-Dimer: No results  found for this basename: DDIMER,  in the last 168 hours CBG:  Recent Labs Lab 08/24/12 1316  GLUCAP 169*   Hemoglobin A1C:  Recent Labs Lab 08/24/12 2317  HGBA1C 6.5*   Fasting Lipid Panel:  Recent Labs Lab 08/24/12 2317  CHOL 163  HDL 39*  LDLCALC 96  TRIG 119  CHOLHDL 4.2   Thyroid Function Tests:  Recent Labs Lab 08/24/12 2317  TSH 1.580   Coagulation:  Recent Labs Lab 08/24/12 1251  LABPROT 13.2  INR 1.02   Anemia Panel: No results found for this basename: VITAMINB12, FOLATE, FERRITIN, TIBC, IRON, RETICCTPCT,  in the last 168 hours Urine Drug Screen: Drugs of Abuse     Component Value Date/Time   LABOPIA NONE DETECTED 08/24/2012 1409   COCAINSCRNUR NONE DETECTED 08/24/2012 1409    LABBENZ NONE DETECTED 08/24/2012 1409   AMPHETMU NONE DETECTED 08/24/2012 1409   THCU NONE DETECTED 08/24/2012 1409   LABBARB NONE DETECTED 08/24/2012 1409    Alcohol Level:  Recent Labs Lab 08/24/12 1251  ETH <11   Urinalysis:  Recent Labs Lab 08/24/12 1409  COLORURINE YELLOW  LABSPEC 1.007  PHURINE 7.0  GLUCOSEU NEGATIVE  HGBUR NEGATIVE  BILIRUBINUR NEGATIVE  KETONESUR NEGATIVE  PROTEINUR NEGATIVE  UROBILINOGEN 1.0  NITRITE NEGATIVE  LEUKOCYTESUR NEGATIVE   Micro Results: No results found for this or any previous visit (from the past 240 hour(s)). Studies/Results: Ct Head Wo Contrast  08/24/2012   *RADIOLOGY REPORT*  Clinical Data: Sided weakness and arm drift on the left; slurred speech  CT HEAD WITHOUT CONTRAST  Technique:  Contiguous axial images were obtained from the base of the skull through the vertex without contrast. Study was obtained within 24 hours of patient arrival at the emergency department.  Comparison: July 31, 2011  Findings: The ventricles are normal in size and configuration. There is no mass, hemorrhage, extra-axial fluid collection, or midline shift.  There is patchy small vessel disease in the centra semiovale  bilaterally.  No acute appearing infarct is appreciated on this study. Bony calvarium appears intact.  The mastoid air cells are clear.  IMPRESSION:  Mild patchy supratentorial small vessel disease.  No demonstrable mass, hemorrhage, or acute appearing infarct.   Original Report Authenticated By: Bretta Bang, M.D.   Medications: I have reviewed the patient's current medications. Scheduled Meds: . albuterol  2 puff Inhalation QHS  . aspirin EC  81 mg Oral Daily  . DULoxetine  60 mg Oral Daily  . heparin  5,000 Units Subcutaneous Q8H  . levETIRAcetam  500 mg Oral BID  . nicotine  21 mg Transdermal Daily  . pantoprazole  40 mg Oral Daily  . rOPINIRole  1 mg Oral QID  . sodium chloride  3 mL Intravenous Q12H  . sodium chloride  3 mL  Intravenous Q12H   Continuous Infusions: . sodium chloride     PRN Meds:.sodium chloride, oxyCODONE, oxyCODONE-acetaminophen, sodium chloride Assessment/Plan:  #Left hand weakness- 62 y.o. female w/ PMHx of possible seizure disorder (non-compliant w/ meds), and HTN, presents to the ED w/ complaints of slurred speech, L hand weakness, and facial numbness and facial droop reported by her husband. The patient reports her facial numbness started yesterday morning and involved her mouth and cheeks. She also reports left hand weakness, but after more history, this is not a new/acute problem. Her husband also reports that her face started to "droop" on the right side some time this AM resulting in slurred speech. On examination in the  ED, no abnormalities were seen except for L hand weakness. Facial droop and slurring of speech not present on exam. CT head in ED shows no acute intracranial abnormalities. Seen by neurology, suspect mostly psychogenic issues, but also EEG from 2013 shows some temporal lobe seizure activity. EEG performed this hospitalization, results pending. Neurology loaded Keppra (1 g) and started 500 mg bid. -Speech and swallow saw patient, said to continue normal diet w/ thin liquids. -Continue ASA 81 mg given normal CT head.  -d/c'ed fluids for now. -Patient on telemetry  -Neuro and neurovascular checks q4h  -Patient placed under fall and seizure precautions.   #HTN- Patient takes HCTZ 25 mg at home. Held in ED given possible TIA/CVA symptoms.  -Restarted home dose given 24 hrs since admission w/ no symptomatic findings for TIA/CVA at this time.  #Anxiety/RLS- Restarted home medications; cymbalta 60 mg qd, ropinirole 1 mg qid   #DVT/PE PPx- Heparin  Dispo: Disposition is deferred at this time, awaiting improvement of current medical problems.  Anticipated discharge in approximately 1-2 day(s).   The patient does have a current PCP Raliegh Ip, MD) and does not need an  [facility] hospital follow-up appointment after discharge.  The patient does not have transportation limitations that hinder transportation to clinic appointments.  .Services Needed at time of discharge: Y = Yes, Blank = No PT: Y  OT: Y  RN: N  Equipment: N  Other: N    LOS: 1 day   Lars Masson, MD 08/25/2012, 3:27 PM Pager: [phone]

## 2012-08-25 NOTE — Progress Notes (Signed)
NEURO HOSPITALIST PROGRESS NOTE   SUBJECTIVE:                                                                                                                        Patient stated she has no left arm weakness until I mentioned her symptoms yesterday then let her arm drop from a horizontal position to her lap and stated "oh ya, I do have weakness". I have obtained the notes from [facility] which there is a dictated EEG showing left temporal sharp waves and seizure activity.  She sees Dr. Marjory Lies as a out patient and was last seen 12/2011. At that time she was on Lamictal but was noncompliant.  No further seizures noted today.   OBJECTIVE:                                                                                                                           Vital signs in last 24 hours: Temp:  [97.2 F (36.2 C)-98.2 F (36.8 C)] 97.2 F (36.2 C) (08/12 0947) Pulse Rate:  [74-106] 74 (08/12 0947) Resp:  [13-22] 20 (08/12 0947) BP: (125-180)/(67-122) 177/90 mmHg (08/12 0947) SpO2:  [93 %-98 %] 98 % (08/12 0947) Weight:  [88.5 kg (195 lb 1.7 oz)] 88.5 kg (195 lb 1.7 oz) (08/11 2100)  Intake/Output from previous day:   Intake/Output this shift: Total I/O In: 360 [P.O.:360] Out: -  Nutritional status: Cardiac  Past Medical History  Diagnosis Date  . Hypertension     Neurologic ROS negative with exception of above. Musculoskeletal ROS none  Neurologic Exam:  Mental Status: Alert, oriented, thought content appropriate.  Speech fluent without evidence of aphasia.  Able to follow 3 step commands without difficulty. Cranial Nerves: II: Discs flat bilaterally; Visual fields grossly normal, pupils equal, round, reactive to light and accommodation III,IV, VI: ptosis not present, extra-ocular motions intact bilaterally V,VII: smile symmetric, facial light touch sensation normal bilaterally VIII: hearing normal bilaterally IX,X: gag reflex present XI:  bilateral shoulder shrug XII: midline tongue extension Motor: Right : Upper extremity   5/5    Left:     Upper extremity   5/5  Lower extremity   5/5     Lower extremity   5/5 --patient  again exhibited inconsistent exam, stating she cannot hold her left arm up but when distracted gave me 5/5 strength and when asked to depress her left elbow lifted arm above head.  Tone and bulk:normal tone throughout; no atrophy noted Sensory: Pinprick and light touch intact throughout, bilaterally Deep Tendon Reflexes:  Right: Upper Extremity   Left: Upper extremity   biceps (C-5 to C-6) 2/4   biceps (C-5 to C-6) 2/4 tricep (C7) 2/4    triceps (C7) 2/4 Brachioradialis (C6) 2/4  Brachioradialis (C6) 2/4  Lower Extremity Lower Extremity  quadriceps (L-2 to L-4) 2/4   quadriceps (L-2 to L-4) 2/4 Achilles (S1) 2/4   Achilles (S1) 2/4  Plantars: Right: downgoing   Left: downgoing Cerebellar: normal finger-to-nose,  normal heel-to-shin test Gait: not assessed CV: pulses palpable throughout    Lab Results: Lab Results  Component Value Date/Time   CHOL 163 08/24/2012 11:17 PM   Lipid Panel  Recent Labs  08/24/12 2317  CHOL 163  TRIG 142  HDL 39*  CHOLHDL 4.2  VLDL 28  LDLCALC 96    Studies/Results: Ct Head Wo Contrast  08/24/2012   *RADIOLOGY REPORT*  Clinical Data: Sided weakness and arm drift on the left; slurred speech  CT HEAD WITHOUT CONTRAST  Technique:  Contiguous axial images were obtained from the base of the skull through the vertex without contrast. Study was obtained within 24 hours of patient arrival at the emergency department.  Comparison: July 31, 2011  Findings: The ventricles are normal in size and configuration. There is no mass, hemorrhage, extra-axial fluid collection, or midline shift.  There is patchy small vessel disease in the centra semiovale  bilaterally.  No acute appearing infarct is appreciated on this study. Bony calvarium appears intact.  The mastoid air cells  are clear.  IMPRESSION:  Mild patchy supratentorial small vessel disease.  No demonstrable mass, hemorrhage, or acute appearing infarct.   Original Report Authenticated By: Bretta Bang, M.D.    MEDICATIONS                                                                                                                        Scheduled: . albuterol  2 puff Inhalation QHS  . aspirin EC  81 mg Oral Daily  . DULoxetine  60 mg Oral Daily  . heparin  5,000 Units Subcutaneous Q8H  . levETIRAcetam  1,000 mg Intravenous STAT  . levETIRAcetam  500 mg Oral BID  . nicotine  21 mg Transdermal Daily  . pantoprazole  40 mg Oral Daily  . rOPINIRole  1 mg Oral QID  . sodium chloride  3 mL Intravenous Q12H  . sodium chloride  3 mL Intravenous Q12H    ASSESSMENT/PLAN:  62 year old female with multiple findings on her exam concerning for psychogenic etiology to her current weakness. I did not witness any of her "seizures" however records from [facility] do show EEG with left temporal spikes and irritability consistent with right arm rhythmic activity.  For this reason, we have loaded patient with Keppra 1 gram and started her on Keppra 500 mg BID PO at discharge.     Recommend: 1) PT/OT as a out patient  2) Continue Keppra 500 mg PO BID 3) Follow up with Dr. Marjory Lies of [facility] [phone] within one month.   No further recommendations.  Neurology will S/O.      Assessment and plan discussed with with attending physician and they are in agreement.    Felicie Morn PA-C [facility] [phone]  08/25/2012, 11:47 AM

## 2012-08-25 NOTE — Progress Notes (Signed)
EEG Completed; Results Pending  

## 2012-08-25 NOTE — Procedures (Signed)
History: 62 yo F with a history of seizures  Background: There is a well defined posterior dominant rhythm of 9.5 Hz that attenuates with eye opening. The background consists of intermixed alpha and beta activities. There is a mild increase in delta activity with drowsiness, and normal sleep structures are seen. Positive occipital transients of sleep are seen.  Photic stimulation: Physiologic driving is present  EEG Abnormalities: None  Clinical Interpretation: This normal EEG is recorded in the waking and sleep state. There was no seizure or seizure predisposition recorded on this study.   Ritta Slot, MD [facility] [phone]  If 7pm- 7am, please page neurology on call at [phone].

## 2012-08-25 NOTE — H&P (Signed)
Date: 08/25/2012  Patient name: Arielle Eber [facility]  Medical record number: [id number]  Date of birth: 07-28-1950   I have seen and evaluated Myrtie Cruise and discussed their care with the Residency Team.  Mrs. Nightengale is a 62yo F who suffers from anxiety/claustrophobia presents with symptoms of perioral/check numbness that has spontaneously resolved. Was evaluated by neurology who recommended MRI. Patient has no further symptoms but quite hesistant to do imaging due to her claustrophobia.  Physical Exam: Blood pressure 141/80, pulse 89, temperature 97.6 F (36.4 C), temperature source Oral, resp. rate 20, height 5\' 2"  (1.575 m), weight 195 lb 1.7 oz (88.5 kg), SpO2 97.00%. Physical Exam  Constitutional:  oriented to person, place, and time.appears well-developed and well-nourished. No distress.  HENT:  Mouth/Throat: Oropharynx is clear and moist. No oropharyngeal exudate.  Cardiovascular: Normal rate, regular rhythm and normal heart sounds. Exam reveals no gallop and no friction rub.  No murmur heard.  Pulmonary/Chest: Effort normal and breath sounds normal. No respiratory distress.  has no wheezes.  Abdominal: Soft. Bowel sounds are normal. exhibits no distension. There is no tenderness.  Lymphadenopathy: no cervical adenopathy.  Neurological: alert and oriented to person, place, and time.  Skin: Skin is warm and dry. No rash noted. No erythema.  Psychiatric:a normal mood and affect.  behavior is normal.    Lab results: Results for orders placed during the hospital encounter of 08/24/12 (from the past 24 hour(s))  TROPONIN I     Status: None   Collection Time    08/24/12 11:17 PM      Result Value Range   Troponin I <0.30  <0.30 ng/mL  TSH     Status: None   Collection Time    08/24/12 11:17 PM      Result Value Range   TSH 1.580  0.350 - 4.500 uIU/mL  HEMOGLOBIN A1C     Status: Abnormal   Collection Time    08/24/12 11:17 PM      Result Value Range   Hemoglobin A1C 6.5 (*) <5.7 %   Mean Plasma Glucose 140 (*) <117 mg/dL  LIPID PANEL     Status: Abnormal   Collection Time    08/24/12 11:17 PM      Result Value Range   Cholesterol 163  0 - 200 mg/dL   Triglycerides 409  <811 mg/dL   HDL 39 (*) >91 mg/dL   Total CHOL/HDL Ratio 4.2     VLDL 28  0 - 40 mg/dL   LDL Cholesterol 96  0 - 99 mg/dL  BASIC METABOLIC PANEL     Status: Abnormal   Collection Time    08/25/12  5:15 AM      Result Value Range   Sodium 137  135 - 145 mEq/L   Potassium 3.4 (*) 3.5 - 5.1 mEq/L   Chloride 98  96 - 112 mEq/L   CO2 27  19 - 32 mEq/L   Glucose, Bld 119 (*) 70 - 99 mg/dL   BUN 9  6 - 23 mg/dL   Creatinine, Ser 4.78  0.50 - 1.10 mg/dL   Calcium 9.3  8.4 - 29.5 mg/dL   GFR calc non Af Amer 89 (*) >90 mL/min   GFR calc Af Amer >90  >90 mL/min  CBC     Status: None   Collection Time    08/25/12  5:15 AM      Result Value Range   WBC 5.7  4.0 - 10.5 K/uL  RBC 4.99  3.87 - 5.11 MIL/uL   Hemoglobin 14.0  12.0 - 15.0 g/dL   HCT 16.1  09.6 - 04.5 %   MCV 84.4  78.0 - 100.0 fL   MCH 28.1  26.0 - 34.0 pg   MCHC 33.3  30.0 - 36.0 g/dL   RDW 40.9  81.1 - 91.4 %   Platelets 203  150 - 400 K/uL    Imaging results:  Ct Head Wo Contrast  08/24/2012   *RADIOLOGY REPORT*  Clinical Data: Sided weakness and arm drift on the left; slurred speech  CT HEAD WITHOUT CONTRAST  Technique:  Contiguous axial images were obtained from the base of the skull through the vertex without contrast. Study was obtained within 24 hours of patient arrival at the emergency department.  Comparison: July 31, 2011  Findings: The ventricles are normal in size and configuration. There is no mass, hemorrhage, extra-axial fluid collection, or midline shift.  There is patchy small vessel disease in the centra semiovale  bilaterally.  No acute appearing infarct is appreciated on this study. Bony calvarium appears intact.  The mastoid air cells are clear.  IMPRESSION:  Mild patchy  supratentorial small vessel disease.  No demonstrable mass, hemorrhage, or acute appearing infarct.   Original Report Authenticated By: Bretta Bang, M.D.    Assessment and Plan: I have seen and evaluated the patient as outlined above. I agree with the formulated Assessment and Plan as detailed in the residents' admission note, with the following changes:    Agree with plan as outlined by Dr. Andrey Campanile. Will give short acting anxiolytic to undergo MRI to ensure no evidence of CVA  Judyann Munson, MD 8/12/20147:30 PM

## 2012-08-25 NOTE — Progress Notes (Signed)
Patient refusing IV fluids.

## 2012-08-25 NOTE — Progress Notes (Signed)
Per Felicie Morn, PA, patient has a spinal cord stimulator, so MRI can not be done

## 2012-08-25 NOTE — Progress Notes (Signed)
Pt appearing very lethargic after PT eval (and post IV keppra administration), however able to tell me correct person, place, time, date, president. Bed alarm set while patient lethargic. MD called, no new orders at this time

## 2012-08-25 NOTE — Evaluation (Signed)
Clinical/Bedside Swallow Evaluation Patient Details  Name: [name] MRN: [id number] Date of Birth: 01/31/1950  Today's Date: 08/25/2012 Time: 4098-1191 SLP Time Calculation (min): 13 min  Past Medical History:  Past Medical History  Diagnosis Date  . Hypertension    Past Surgical History: History reviewed. No pertinent past surgical history. HPI:  [name] is a 62 y.o. female w/ PMHx of possible seizure disorder (non-compliant w/ meds), and HTN, presents to the ED w/ complaints of slurred speech, L hand weakness, and facial numbness and facial droop reported by her husband. The patient reports her facial numbness started yesterday morning and involved her mouth and cheeks. She also reports left hand weakness, but after more history, this is not a new/acute problem. Her husband also reports that her face started to "droop" on the right side some time this AM resulting in slurred speech. She was not having issues with finding words, but more so with pronunciation 2/2 her facial weakness. This, in addition to the facial numbness is a new finding for her. Both the patient and her husband are relatively poor historians. The husband also describes many recent "seizures" where his wife will begin "convusling" when she is standing and will slowly sit down by herself or with assistance. CT negative, MRi cannot be done. MD considering psychogenic etiology   Assessment / Plan / Recommendation Clinical Impression  Pt demonstrates normal oral and oropharyngeal function. Pt does not have good dentition, but manages mastication without dentures at baseline. Recommend continue regular diet with thin liquids. No SLP f/u needed.     Aspiration Risk  None    Diet Recommendation Regular;Thin liquid   Liquid Administration via: Cup;Straw Medication Administration: Whole meds with liquid Supervision: Patient able to self feed    Other  Recommendations Oral Care Recommendations:  Oral care BID   Follow Up Recommendations       Frequency and Duration        Pertinent Vitals/Pain NA    SLP Swallow Goals     Swallow Study Prior Functional Status       General HPI: [name] is a 62 y.o. female w/ PMHx of possible seizure disorder (non-compliant w/ meds), and HTN, presents to the ED w/ complaints of slurred speech, L hand weakness, and facial numbness and facial droop reported by her husband. The patient reports her facial numbness started yesterday morning and involved her mouth and cheeks. She also reports left hand weakness, but after more history, this is not a new/acute problem. Her husband also reports that her face started to "droop" on the right side some time this AM resulting in slurred speech. She was not having issues with finding words, but more so with pronunciation 2/2 her facial weakness. This, in addition to the facial numbness is a new finding for her. Both the patient and her husband are relatively poor historians. The husband also describes many recent "seizures" where his wife will begin "convusling" when she is standing and will slowly sit down by herself or with assistance. CT negative, MRi cannot be done. MD considering psychogenic etiology Type of Study: Bedside swallow evaluation Diet Prior to this Study: Regular;Thin liquids Temperature Spikes Noted: No Respiratory Status: Room air History of Recent Intubation: No Behavior/Cognition: Cooperative;Pleasant mood;Lethargic Oral Cavity - Dentition: Missing dentition;Poor condition Self-Feeding Abilities: Able to feed self Patient Positioning: Upright in bed Baseline Vocal Quality: Clear Volitional Swallow: Able to elicit    Oral/Motor/Sensory Function Overall Oral Motor/Sensory Function:  Appears within functional limits for tasks assessed   Ice Chips     Thin Liquid Thin Liquid: Within functional limits Presentation: Cup;Straw;Self Fed    Nectar Thick Nectar Thick Liquid:  Not tested   Honey Thick Honey Thick Liquid: Not tested   Puree Puree: Within functional limits   Solid   GO    Solid: Within functional limits      [facility], [place] CCC-SLP [phone]  [name] 08/25/2012,2:30 PM

## 2012-08-26 LAB — BASIC METABOLIC PANEL
CO2: 26 mEq/L (ref 19–32)
Calcium: 9 mg/dL (ref 8.4–10.5)
Chloride: 99 mEq/L (ref 96–112)
Creatinine, Ser: 0.69 mg/dL (ref 0.50–1.10)
GFR calc Af Amer: >90 mL/min (ref >90)
Sodium: 136 mEq/L (ref 135–145)

## 2012-08-26 MED ORDER — LEVETIRACETAM 500 MG PO TABS: 500.0000 mg | ORAL_TABLET | Freq: Two times a day (BID) | ORAL | Status: DC

## 2012-08-26 MED ORDER — POTASSIUM CHLORIDE CRYS ER 20 MEQ PO TBCR
40.0000 meq | EXTENDED_RELEASE_TABLET | Freq: Two times a day (BID) | ORAL | Status: DC
  Administered 2012-08-26: 40 meq via ORAL
  Filled 2012-08-26 (×3): qty 2

## 2012-08-26 NOTE — Progress Notes (Signed)
Subjective: [name] is a 62 y.o. female w/ PMHx of possible seizure disorder (non-compliant w/ meds), and HTN, presents to the ED w/ complaints of slurred speech, L hand weakness, and facial numbness and facial droop reported by her husband.   Patient seen at bedside this AM. Feel very well today. Denies any seizures, weakness, numbness, lightheadedness, dizziness, SOB, or chest pain. Perioral numbness has almost completely subsided.  EEG performed yesterday showed no seizure or seizure predisposition. She was seen by neurology yesterday. Previous EEG results from 2013 were obtained and there did show left temporal sharp waves and seizure activity. Neurology loaded patient w/ 1 g Keppra and started her on 500 mg bid for seizures. Will continue at discharge as per neurology.  Patient was scheduled for MRI yesterday but it was found that she has a spinal cord stimulator and is not a candidate for MRI given the presence of hardware.   Objective: Vital signs in last 24 hours: Filed Vitals:   08/25/12 2112 08/25/12 2130 08/26/12 0200 08/26/12 0507  BP: 171/83 152/82 154/88 156/88  Pulse: 88  95 79  Temp: 97.4 F (36.3 C)  97.6 F (36.4 C) 97.7 F (36.5 C)  TempSrc: Oral  Oral Oral  Resp: 18  18 18   Height:      Weight:      SpO2: 96%  96% 97%   Weight change:   Intake/Output Summary (Last 24 hours) at 08/26/12 0759 Last data filed at 08/25/12 7846  Gross per 24 hour  Intake    360 ml  Output      0 ml  Net    360 ml   Physical Exam: General: Alert, cooperative, and in no apparent distress HEENT: Vision grossly intact, oropharynx clear and non-erythematous  Neck: Full range of motion without pain, supple, no lymphadenopathy or carotid bruits Lungs: Clear to ascultation bilaterally, normal work of respiration, no wheezes, rales, ronchi Heart: Regular rate and rhythm, no murmurs, gallops, or rubs Abdomen: Soft, non-tender, non-distended, normal bowel  sounds Extremities: No cyanosis, clubbing, or edema. Weakness in left hand. Neurologic: Alert & oriented X3, cranial nerves II-XII intact, strength grossly intact, left hand strength better today. Sensation intact to light touch. Perioral numbness very minimal today.  Lab Results: Basic Metabolic Panel:  Recent Labs Lab 08/24/12 1251 08/24/12 1349 08/25/12 0515  NA 134* 137 137  K 3.6 3.5 3.4*  CL 99 101 98  CO2 25  --  27  GLUCOSE 152* 154* 119*  BUN 9 7 9   CREATININE 0.60 0.70 0.73  CALCIUM 9.1  --  9.3   Liver Function Tests:  Recent Labs Lab 08/24/12 1251  AST 19  ALT 16  ALKPHOS 124*  BILITOT 0.2*  PROT 7.0  ALBUMIN 3.4*   No results found for this basename: LIPASE, AMYLASE,  in the last 168 hours No results found for this basename: AMMONIA,  in the last 168 hours CBC:  Recent Labs Lab 08/24/12 1251 08/24/12 1349 08/25/12 0515  WBC 6.6  --  5.7  NEUTROABS 3.7  --   --   HGB 14.5 15.0 14.0  HCT 41.7 44.0 42.1  MCV 82.6  --  84.4  PLT 210  --  203   Cardiac Enzymes:  Recent Labs Lab 08/24/12 1252 08/24/12 2317  TROPONINI <0.30 <0.30   BNP: No results found for this basename: PROBNP,  in the last 168 hours D-Dimer: No results found for this basename: DDIMER,  in the  last 168 hours CBG:  Recent Labs Lab 08/24/12 1316  GLUCAP 169*   Hemoglobin A1C:  Recent Labs Lab 08/24/12 2317  HGBA1C 6.5*   Fasting Lipid Panel:  Recent Labs Lab 08/24/12 2317  CHOL 163  HDL 39*  LDLCALC 96  TRIG 098  CHOLHDL 4.2   Thyroid Function Tests:  Recent Labs Lab 08/24/12 2317  TSH 1.580   Coagulation:  Recent Labs Lab 08/24/12 1251  LABPROT 13.2  INR 1.02   Anemia Panel: No results found for this basename: VITAMINB12, FOLATE, FERRITIN, TIBC, IRON, RETICCTPCT,  in the last 168 hours Urine Drug Screen: Drugs of Abuse     Component Value Date/Time   LABOPIA NONE DETECTED 08/24/2012 1409   COCAINSCRNUR NONE DETECTED 08/24/2012 1409    LABBENZ NONE DETECTED 08/24/2012 1409   AMPHETMU NONE DETECTED 08/24/2012 1409   THCU NONE DETECTED 08/24/2012 1409   LABBARB NONE DETECTED 08/24/2012 1409    Alcohol Level:  Recent Labs Lab 08/24/12 1251  ETH <11   Urinalysis:  Recent Labs Lab 08/24/12 1409  COLORURINE YELLOW  LABSPEC 1.007  PHURINE 7.0  GLUCOSEU NEGATIVE  HGBUR NEGATIVE  BILIRUBINUR NEGATIVE  KETONESUR NEGATIVE  PROTEINUR NEGATIVE  UROBILINOGEN 1.0  NITRITE NEGATIVE  LEUKOCYTESUR NEGATIVE   Micro Results: No results found for this or any previous visit (from the past 240 hour(s)). Studies/Results: Ct Head Wo Contrast  08/24/2012   *RADIOLOGY REPORT*  Clinical Data: Sided weakness and arm drift on the left; slurred speech  CT HEAD WITHOUT CONTRAST  Technique:  Contiguous axial images were obtained from the base of the skull through the vertex without contrast. Study was obtained within 24 hours of patient arrival at the emergency department.  Comparison: July 31, 2011  Findings: The ventricles are normal in size and configuration. There is no mass, hemorrhage, extra-axial fluid collection, or midline shift.  There is patchy small vessel disease in the centra semiovale  bilaterally.  No acute appearing infarct is appreciated on this study. Bony calvarium appears intact.  The mastoid air cells are clear.  IMPRESSION:  Mild patchy supratentorial small vessel disease.  No demonstrable mass, hemorrhage, or acute appearing infarct.   Original Report Authenticated By: Bretta Bang, M.D.   Medications: I have reviewed the patient's current medications. Scheduled Meds: . albuterol  2 puff Inhalation QHS  . aspirin EC  81 mg Oral Daily  . DULoxetine  60 mg Oral Daily  . heparin  5,000 Units Subcutaneous Q8H  . hydrochlorothiazide  25 mg Oral Daily  . levETIRAcetam  500 mg Oral BID  . nicotine  21 mg Transdermal Daily  . pantoprazole  40 mg Oral Daily  . potassium chloride  40 mEq Oral BID WC  . rOPINIRole  1 mg  Oral QID  . sodium chloride  3 mL Intravenous Q12H  . sodium chloride  3 mL Intravenous Q12H   Continuous Infusions:   PRN Meds:.sodium chloride, oxyCODONE, oxyCODONE-acetaminophen, sodium chloride Assessment/Plan:  #Left hand weakness- 62 y.o. female w/ PMHx of possible seizure disorder (non-compliant w/ meds), and HTN, presents to the ED w/ complaints of slurred speech, L hand weakness, and facial numbness and facial droop reported by her husband. The patient reports her facial numbness started yesterday morning and involved her mouth and cheeks. She also reports left hand weakness, but after more history, this is not a new/acute problem. Her husband also reports that her face started to "droop" on the right side some time this AM resulting in slurred  speech. On examination in the ED, no abnormalities were seen except for L hand weakness. Facial droop and slurring of speech not present on exam. CT head in ED shows no acute intracranial abnormalities. Seen by neurology, suspect mostly psychogenic issues, but also EEG from 2013 shows some temporal lobe seizure activity. EEG performed yesterday shows no seizure or seizure predisposition. Neurology loaded Keppra (1 g) yesterday and started 500 mg bid. -Speech and swallow saw patient, said to continue normal diet w/ thin liquids. -Continue ASA 81 mg given normal CT head.  -Patient on telemetry  -Neuro and neurovascular checks q4h  -Patient placed under fall and seizure precautions.  -Continuing Keppra 500 mg bid as per neurology. Scheduled for outpatient follow up on discharge.  #HTN- Patient takes HCTZ 25 mg at home. Held in ED given possible TIA/CVA symptoms.  -Restarted home dose.  #Anxiety/RLS- Restarted home medications; cymbalta 60 mg qd, ropinirole 1 mg qid   #DVT/PE PPx- Heparin  Dispo: Disposition is deferred at this time, awaiting improvement of current medical problems.  Anticipated discharge Today. The patient does have a current  PCP Raliegh Ip, MD) and does not need an [facility] hospital follow-up appointment after discharge.  The patient does not have transportation limitations that hinder transportation to clinic appointments.  .Services Needed at time of discharge: Y = Yes, Blank = No PT: N  OT: N  RN: N  Equipment: N  Other: N    LOS: 2 days   Lars Masson, MD 08/26/2012, 7:59 AM Pager: [phone]

## 2012-08-26 NOTE — Progress Notes (Signed)
  Date: 08/26/2012  Patient name: [name] [facility]  Medical record number: [id number]  Date of birth: Jun 25, 1950   This patient has been seen and the plan of care was discussed with the house staff. Please see their note for complete details. I concur with their findings with the following additions/corrections:  Patient underwent work up of EEG which was normal, and no signs of seizure. Unable to do MRI due to spinal cord stimulator. Patient appears much improved. 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 She will still be kept of keppra due to her history of having a siezure. We will re-establish her to see neurology at [facility] upon discharge. She is ready for discharge today.  Judyann Munson, MD 08/26/2012, 10:24 PM

## 2012-08-26 NOTE — Progress Notes (Signed)
Brief Nutrition Note:   RD pulled to pt for malnutrition screening tool report of unintentional weight loss.   Weight hx reviewed, about 7 lb weight loss in the past year. Not significant for time frame.  Pt reports appetite is normal at this time. Ate 100% x 2 meals so far this admission.  Body mass index is 35.68 kg/(m^2). Obesity class 2 Diet: Heart Healthy  Chart reviewed, no nutrition interventions warranted at this time. Please consult as needed.   Clarene Duke RD, LDN Pager [phone] After Hours pager [phone]

## 2012-08-26 NOTE — Progress Notes (Signed)
At 0900, pt walking, stopped staring for roughly 10 seconds. Seemed unsteady, assisted to chair. After event pt was alert, oriented x 4, appropriate at baseline asking "what happened. Did I have a seizure?" Assisted pt back to bed, applied bed alarm. MD notified. No new orders, will continue to monitor. Rydan Gulyas E

## 2012-08-26 NOTE — Progress Notes (Signed)
Physical Therapy Treatment Patient Details Name: [name] MRN: [id number] DOB: [birth year] Today's Date: 08/26/2012 Time: 2440-1027 PT Time Calculation (min): 23 min  PT Assessment / Plan / Recommendation  History of Present Illness [name] is a 62 y.o. female w/ PMHx of possible seizure disorder (non-compliant w/ meds), and HTN, presents to the ED w/ complaints of slurred speech, L hand weakness, and facial numbness and facial droop reported by her husband. The patient reports her facial numbness started yesterday morning and involved her mouth and cheeks. She also reports left hand weakness, but after more history, this is not a new/acute problem. Her husband also reports that her face started to "droop" on the right side some time this AM resulting in slurred speech. She was not having issues with finding words, but more so with pronunciation 2/2 her facial weakness. This, in addition to the facial numbness is a new finding for her. Both the patient and her husband are relatively poor historians. The husband also describes many recent "seizures" where his wife will begin "convusling" when she is standing and will slowly sit down by herself or with assistance. She denies any LOC, voiding of bowel or bladder, tongue biting, or falls resulting in head injury or trauma. According to the patient, she used to take medications for her seizures, but does not take them anymore because they made her feel "drunk".     PT Comments   Much improved today.  Still test with weaknesses, but she functions well with them.   Follow Up Recommendations  No PT follow up     Does the patient have the potential to tolerate intense rehabilitation     Barriers to Discharge        Equipment Recommendations  None recommended by PT    Recommendations for Other Services    Frequency Min 3X/week   Progress towards PT Goals Progress towards PT goals: Progressing toward goals  Plan  Current plan remains appropriate    Precautions / Restrictions Precautions Precautions: Fall Restrictions Weight Bearing Restrictions: No   Pertinent Vitals/Pain     Mobility  Bed Mobility Bed Mobility: Rolling Left;Left Sidelying to Sit;Sit to Supine;Sitting - Scoot to Edge of Bed Rolling Left: 4: Min guard;With rail Left Sidelying to Sit: 4: Min guard;With rails Sitting - Scoot to Edge of Bed: 4: Min assist Sit to Supine: 4: Min guard Details for Bed Mobility Assistance: pt acting lethargic and "drunk-like", but able to mobilize with little assist Transfers Transfers: Sit to Stand;Stand to Sit Sit to Stand: 4: Min assist;With upper extremity assist;From bed Stand to Sit: 4: Min assist;With upper extremity assist;To bed Details for Transfer Assistance: steady assist due to lethargy Ambulation/Gait Ambulation/Gait Assistance: 5: Supervision Ambulation Distance (Feet): 150 Feet Assistive device: None Ambulation/Gait Assistance Details: much more steady than on eval when pt was so lethargic Gait Pattern: Within Functional Limits Gait velocity: variable, but slower Stairs: Yes Stairs Assistance: 5: Supervision Stair Management Technique: One rail Left;Step to pattern;Forwards Number of Stairs: 8 Wheelchair Mobility Wheelchair Mobility: No    Exercises     PT Diagnosis:    PT Problem List:   PT Treatment Interventions:     PT Goals (current goals can now be found in the care plan section) Acute Rehab PT Goals Patient Stated Goal: no goal stated PT Goal Formulation: With patient Time For Goal Achievement: 09/01/12 Potential to Achieve Goals: Good  Visit Information  Last PT Received On: 08/26/12 Assistance Needed: +1  History of Present Illness: [name] is a 62 y.o. female w/ PMHx of possible seizure disorder (non-compliant w/ meds), and HTN, presents to the ED w/ complaints of slurred speech, L hand weakness, and facial numbness and facial droop  reported by her husband. The patient reports her facial numbness started yesterday morning and involved her mouth and cheeks. She also reports left hand weakness, but after more history, this is not a new/acute problem. Her husband also reports that her face started to "droop" on the right side some time this AM resulting in slurred speech. She was not having issues with finding words, but more so with pronunciation 2/2 her facial weakness. This, in addition to the facial numbness is a new finding for her. Both the patient and her husband are relatively poor historians. The husband also describes many recent "seizures" where his wife will begin "convusling" when she is standing and will slowly sit down by herself or with assistance. She denies any LOC, voiding of bowel or bladder, tongue biting, or falls resulting in head injury or trauma. According to the patient, she used to take medications for her seizures, but does not take them anymore because they made her feel "drunk".      Subjective Data  Subjective: I don't take my meds when I don't eat. Patient Stated Goal: no goal stated   Cognition  Cognition Arousal/Alertness: Awake/alert Behavior During Therapy: WFL for tasks assessed/performed Overall Cognitive Status: Within Functional Limits for tasks assessed    Balance     End of Session PT - End of Session Activity Tolerance: Patient tolerated treatment well Patient left: in bed;Other (comment);with call bell/phone within reach;with nursing/sitter in room (sittibng EOB) Nurse Communication: Mobility status   GP     Marilene Vath, Eliseo Gum 08/26/2012, 5:15 PM 08/26/2012  Saranac Bing, PT [phone]  (pager)

## 2012-08-26 NOTE — Evaluation (Signed)
Occupational Therapy Evaluation Patient Details Name: [name] MRN: [id number] DOB: [birth year] Today's Date: 08/26/2012 Time: 7829-5621 OT Time Calculation (min): 23 min  OT Assessment / Plan / Recommendation History of present illness [name] is a 62 y.o. female w/ PMHx of possible seizure disorder (non-compliant w/ meds), and HTN, presents to the ED w/ complaints of slurred speech, L hand weakness, and facial numbness and facial droop reported by her husband. The patient reports her facial numbness started yesterday morning and involved her mouth and cheeks. She also reports left hand weakness, but after more history, this is not a new/acute problem. Her husband also reports that her face started to "droop" on the right side some time this AM resulting in slurred speech. She was not having issues with finding words, but more so with pronunciation 2/2 her facial weakness. This, in addition to the facial numbness is a new finding for her. Both the patient and her husband are relatively poor historians. The husband also describes many recent "seizures" where his wife will begin "convusling" when she is standing and will slowly sit down by herself or with assistance. She denies any LOC, voiding of bowel or bladder, tongue biting, or falls resulting in head injury or trauma. According to the patient, she used to take medications for her seizures, but does not take them anymore because they made her feel "drunk".     Clinical Impression   Patient evaluated by Occupational Therapy with no further acute OT needs identified. All education has been completed and the patient has no further questions. See below for any follow-up Occupational Therapy or equipment needs. OT to sign off. Thank you for referral.      OT Assessment  Patient does not need any further OT services    Follow Up Recommendations  No OT follow up    Barriers to Discharge      Equipment  Recommendations  None recommended by OT    Recommendations for Other Services    Frequency       Precautions / Restrictions     Pertinent Vitals/Pain No pain    ADL  Eating/Feeding: Independent Where Assessed - Eating/Feeding: Edge of bed Grooming: Wash/dry hands;Wash/dry face;Independent Where Assessed - Grooming: Supported standing Lower Body Dressing: Independent Where Assessed - Lower Body Dressing: Unsupported sit to stand Toilet Transfer: Independent Toilet Transfer Method: Sit to Barista: Regular height toilet Toileting - Clothing Manipulation and Hygiene: Independent Where Assessed - Toileting Clothing Manipulation and Hygiene: Sit to stand from 3-in-1 or toilet Equipment Used: Gait belt Transfers/Ambulation Related to ADLs: Pt ambulated without deficits and completed stairs. pt able to step into tub based on stair transfer ADL Comments: Pt verbalized during session poor compentence with medications. Pt states "if I dont eat I dotn take my medication. and I am not taking two of those pills because I dont like how ti makes me feel. They will think I have that ahhd" Pt educated on the importance of self report to the md when not taking medications as prescribed and also taking medications on time. Pt just laughs.     OT Diagnosis:    OT Problem List:   OT Treatment Interventions:     OT Goals(Current goals can be found in the care plan section) Acute Rehab OT Goals Patient Stated Goal: no goal stated  Visit Information  Last OT Received On: 08/26/12 Assistance Needed: +1 History of Present Illness: [name] is a  62 y.o. female w/ PMHx of possible seizure disorder (non-compliant w/ meds), and HTN, presents to the ED w/ complaints of slurred speech, L hand weakness, and facial numbness and facial droop reported by her husband. The patient reports her facial numbness started yesterday morning and involved her mouth and cheeks. She also  reports left hand weakness, but after more history, this is not a new/acute problem. Her husband also reports that her face started to "droop" on the right side some time this AM resulting in slurred speech. She was not having issues with finding words, but more so with pronunciation 2/2 her facial weakness. This, in addition to the facial numbness is a new finding for her. Both the patient and her husband are relatively poor historians. The husband also describes many recent "seizures" where his wife will begin "convusling" when she is standing and will slowly sit down by herself or with assistance. She denies any LOC, voiding of bowel or bladder, tongue biting, or falls resulting in head injury or trauma. According to the patient, she used to take medications for her seizures, but does not take them anymore because they made her feel "drunk".         Prior Functioning     Home Living Family/patient expects to be discharged to:: Private residence Living Arrangements: Spouse/significant other Available Help at Discharge: Family;Other (Comment) Type of Home: House Home Access: Stairs to enter [employer] of Steps: several Entrance Stairs-Rails: Right;Left Home Layout: One level Prior Function Level of Independence: Independent Communication Communication: No difficulties Dominant Hand: Right         Vision/Perception Vision - History Baseline Vision: No visual deficits Patient Visual Report: No change from baseline Vision - Assessment Eye Alignment: Within Functional Limits Vision Assessment:  (read newspaper no deficits)   Cognition  Cognition Arousal/Alertness: Awake/alert Behavior During Therapy: WFL for tasks assessed/performed Overall Cognitive Status: Within Functional Limits for tasks assessed    Extremity/Trunk Assessment Upper Extremity Assessment Upper Extremity Assessment: LUE deficits/detail LUE Deficits / Details: testing demonstrates 3 out 5 grasp  strength. During session patient was able to toss a ball and catch it with speed. Pt was able to use functionally to open doors and turn on water. Pt testing does not reflect functional use strength LUE Coordination: decreased gross motor Lower Extremity Assessment Lower Extremity Assessment: Defer to PT evaluation     Mobility Bed Mobility Bed Mobility: Rolling Left;Left Sidelying to Sit;Sit to Supine;Sitting - Scoot to Edge of Bed Rolling Left: 4: Min guard;With rail Left Sidelying to Sit: 4: Min guard;With rails Sitting - Scoot to Edge of Bed: 4: Min assist Sit to Supine: 4: Min guard Details for Bed Mobility Assistance: pt acting lethargic and "drunk-like", but able to mobilize with little assist Transfers Sit to Stand: 4: Min assist;With upper extremity assist;From bed Stand to Sit: 4: Min assist;With upper extremity assist;To bed Details for Transfer Assistance: steady assist due to lethargy     Exercise     Balance     End of Session OT - End of Session Activity Tolerance: Patient tolerated treatment well Patient left: in bed;with call bell/phone within reach Nurse Communication: Mobility status;Precautions  GO     Lucile Shutters 08/26/2012, 4:53 PM Pager: [phone]

## 2012-08-29 NOTE — Discharge Summary (Signed)
Name: [name] MRN: [id number] DOB: 03-Dec-1950 62 y.o. PCP: Marcine Matar., MD  Date of Admission: 08/24/2012 12:41 PM Date of Discharge: 08/26/12 Attending Physician: Judyann Munson  Discharge Diagnosis: 1. Left-hand weakness, perioral numbness, and reported facial droop- Patient presented w/ L hand weakness, discovered to not be a new problem, likely d/y cervical stenosis reported by the patient. Perioral numbness reported to be a new, intermittent issue w/ facial droop reported by husband, but not present on exam. CT showed no acute bleed. Problems most likely d/t psychogenic issues, also agreed upon by neurology. 2. Reported seizure activity- EEG from ?2012 showed temporal seizure focus. EEG performed on this hospitalization showed no seizure focus. Discharged w/ 500 mg Keppra bid as per neurology. 3. Anxiety/depression 4. GERD 5. Tobacco abuse 6. HTN  Discharge Medications:   Medication List         albuterol 108 (90 BASE) MCG/ACT inhaler  Commonly known as:  PROVENTIL HFA;VENTOLIN HFA  Inhale 2 puffs into the lungs at bedtime.     DULoxetine 60 MG capsule  Commonly known as:  CYMBALTA  Take 60 mg by mouth daily.     estradiol 1 MG tablet  Commonly known as:  ESTRACE  Take 1 mg by mouth daily.     hydrochlorothiazide 25 MG tablet  Commonly known as:  HYDRODIURIL  Take 25 mg by mouth daily.     levETIRAcetam 500 MG tablet  Commonly known as:  KEPPRA  Take 1 tablet (500 mg total) by mouth 2 (two) times daily.     oxyCODONE-acetaminophen 7.5-325 MG per tablet  Commonly known as:  PERCOCET  Take 1 tablet by mouth every 4 (four) hours as needed. For pain     pantoprazole 40 MG tablet  Commonly known as:  PROTONIX  Take 40 mg by mouth daily.     rOPINIRole 1 MG tablet  Commonly known as:  REQUIP  Take 1 mg by mouth 4 (four) times daily.        Disposition and follow-up:   [name] was discharged from [facility] in Good condition.  At the hospital follow up visit please address:  1.  Perioral numbness, Keppra compliance.  2.  Labs / imaging needed at time of follow-up: none  3.  Pending labs/ test needing follow-up: none  Follow-up Appointments:     Follow-up Information   Follow up with Boykin Peek, MD On 09/04/2012. (9:30 AM)    Specialty:  Internal Medicine   Contact information:   [address] [facility] [place] [address] [phone]       Follow up with Joycelyn Schmid, MD On 09/28/2012. (9:30 AM)    Specialties:  Neurology, Radiology   Contact information:   [address] [place] [address] [phone]       Discharge Instructions: Discharge Orders   Future Appointments Provider Department Dept Phone   09/04/2012 9:30 AM Boykin Peek, MD [facility] [phone]   09/28/2012 10:00 AM Suanne Marker, MD [facility] [phone]   Future Orders Complete By Expires   Call MD for:  difficulty breathing, headache or visual disturbances  As directed    Call MD for:  extreme fatigue  As directed    Call MD for:  persistant dizziness or light-headedness  As directed    Call MD for:  persistant nausea and vomiting  As directed    Diet -  low sodium heart healthy  As directed    Increase activity slowly  As directed       Consultations:    Procedures Performed:  Ct Head Wo Contrast  08/24/2012   *RADIOLOGY REPORT*  Clinical Data: Sided weakness and arm drift on the left; slurred speech  CT HEAD WITHOUT CONTRAST  Technique:  Contiguous axial images were obtained from the base of the skull through the vertex without contrast. Study was obtained within 24 hours of patient arrival at the emergency department.  Comparison: July 31, 2011  Findings: The ventricles are normal in size and configuration. There is no mass, hemorrhage, extra-axial fluid collection, or midline  shift.  There is patchy small vessel disease in the centra semiovale  bilaterally.  No acute appearing infarct is appreciated on this study. Bony calvarium appears intact.  The mastoid air cells are clear.  IMPRESSION:  Mild patchy supratentorial small vessel disease.  No demonstrable mass, hemorrhage, or acute appearing infarct.   Original Report Authenticated By: Bretta Bang, M.D.   Admission HPI:  [name] is a 62 y.o. female w/ PMHx of possible seizure disorder (non-compliant w/ meds), and HTN, presents to the ED w/ complaints of slurred speech, L hand weakness, and facial numbness and facial droop reported by her husband. The patient reports her facial numbness started yesterday morning and involved her mouth and cheeks. She also reports left hand weakness, but after more history, this is not a new/acute problem. Her husband also reports that her face started to "droop" on the right side some time this AM resulting in slurred speech. She was not having issues with finding words, but more so with pronunciation 2/2 her facial weakness. This, in addition to the facial numbness is a new finding for her. Both the patient and her husband are relatively poor historians. The husband also describes many recent "seizures" where his wife will begin "convusling" when she is standing and will slowly sit down by herself or with assistance. She denies any LOC, voiding of bowel or bladder, tongue biting, or falls resulting in head injury or trauma. According to the patient, she used to take medications for her seizures, but does not take them anymore because they made her feel "drunk".  On examination in the ED, the patient did not exhibit any facial droop or difficulty speaking. She did have left hand weakness, but this is not a new issue. No CN abnormalities.  She otherwise denies any recent SOB, chest pain, nausea, vomiting, lightheadedness, dizziness, clotting issues, or bleeding problems. CT  head in the ED shows no acute intracranial abnormality.  Hospital Course by problem list:   1. Left-hand weakness, peri-oral numbness- [name] presented to the ED w/ complaints of slurred speech, L hand weakness, and facial numbness and facial droop reported by her husband. The patient reported that her facial numbness started the day previous to admission and involved her mouth and cheeks. On examination in the ED, the only symptom that seemed to be present was her left hand weakness, reportedly an older issue 2/2 previous cervical spinal stenosis, which she has had a previous surgery for. CT was performed in the ED which was -ve for acute intracranial pathology. Neurology was also consulted and recommended an MRI and EEG, but suspected that her findings were mainly psychogenic as her history seemed to be inconsistent. She was started on ASA 81 mg, NS @ 75 ml/hr, placed on tele, and kept NPO for following swallow evaluation, which  was normal. She informed us on the second day of admission that she had a spinal cord stimulator, restricting MRI evaluation. It was clinically suspected after some time that her symptoms were in fact psychogenic as her history continued to be relatively inconsistent, neurology agreed. The patient refused to have a psychiatry consult in order to rule out possible underlying psychiatric issues.  2. Possible Seizure disorder- On presentation, the patient claimed she had a seizure disorder and had for some time. She claimed her last seizure was on the day of admission. When asked specifically about the events (history corroborated by her husband), she becomes weak, starts to shake in her arms, and typically voluntarily sits in a chair to avoid falling. She has followed w/ Dr. Marjory Lies at [facility] in the past. On an EEG from ?2012, she reportedly had a seizure focus in the R. Temporal lobe and was started on Lamictal at that time but has not been compliant since because the medication  made her "feel drunk". Neurology started her on Keppra 500 mg bid during this admission to continue as an outpatient. Her EEG performed at this hospitalization did not show any sign of a seizure focus. On her last day of admission, she had an episode where she wandered down the hall, stopped, and looked uneasy on her feet. She was helped into a chair by a floor nurse and said she did not remember the event.  3. HTN- Patient takes HCTZ 25 mg at home. This was held at admission because of possible TIA/CVA. Blood pressure was stable during her admission.  4. Anxiety/depression- We continued her home medications; Cymbalta 60 mg qd, and Ropinirole 1 mg qd for RLS.  Discharge Vitals:   BP 147/83  Pulse 85  Temp(Src) 97.7 F (36.5 C) (Oral)  Resp 20  Ht 5\' 2"  (1.575 m)  Wt 195 lb 1.7 oz (88.5 kg)  BMI 35.68 kg/m2  SpO2 94%  Discharge Labs:  No results found for this or any previous visit (from the past 24 hour(s)).  Signed: Lars Masson, MD 08/29/2012, 2:06 PM   Time Spent on Discharge: 35 minutes Services Ordered on Discharge: none Equipment Ordered on Discharge: none

## 2012-09-01 NOTE — Discharge Summary (Signed)
  Date: 09/01/2012  Patient name: [name] [facility]  Medical record number: [id number]  Date of birth: Sep 12, 1950   This patient has been seen and the plan of care was discussed with the house staff. Please see their note for complete details. I concur with their findings with the following additions/corrections:  Agree with the plan as outlined by Dr. Yetta Barre. Patient was evaluated by neurology consult service who assessed spells. Recent EEG was not consistent with seizure although since she had previous EEG with patterns suggestive of seizures she was placed on antiepelectics. She is to follow up with [facility]. Also recommended to consider follow up with psychiatry due to some speels being triggered by stress.  Judyann Munson, MD 09/01/2012, 9:41 PM

## 2012-09-04 ENCOUNTER — Ambulatory Visit (INDEPENDENT_AMBULATORY_CARE_PROVIDER_SITE_OTHER): Payer: Medicare PPO | Admitting: Internal Medicine

## 2012-09-04 ENCOUNTER — Encounter: Payer: Self-pay | Admitting: Internal Medicine

## 2012-09-04 VITALS — BP 127/81 | HR 91 | Temp 97.1°F | Ht 62.0 in | Wt 200.2 lb

## 2012-09-04 DIAGNOSIS — F17200 Nicotine dependence, unspecified, uncomplicated: Secondary | ICD-10-CM

## 2012-09-04 DIAGNOSIS — M6281 Muscle weakness (generalized): Secondary | ICD-10-CM

## 2012-09-04 DIAGNOSIS — R29898 Other symptoms and signs involving the musculoskeletal system: Secondary | ICD-10-CM

## 2012-09-04 NOTE — Progress Notes (Signed)
I saw and evaluated the patient.  I personally confirmed the key portions of the history and exam documented by Dr. Delane Ginger and I reviewed pertinent patient test results.  The assessment, diagnosis, and plan were formulated together and I agree with the documentation in the resident's note. Pt prefers to cont to see Dr Vear Clock for chronic care.

## 2012-09-04 NOTE — Progress Notes (Signed)
Patient ID: [name], female   DOB: March 21, 1950, 62 y.o.   MRN: [id number]     Subjective:   Patient ID: [name] female   DOB: 03/20/1950 62 y.o.   MRN: [id number]  HPI: [name] is a 62 y.o. here for a hospital f/u.  She has a PMH outlined below.  Please see problem based assessment and plan below for further details of her medical problems.   Past Medical History  Diagnosis Date  . Hypertension    Current Outpatient Prescriptions  Medication Sig Dispense Refill  . albuterol (PROVENTIL HFA;VENTOLIN HFA) 108 (90 BASE) MCG/ACT inhaler Inhale 2 puffs into the lungs at bedtime.      . DULoxetine (CYMBALTA) 60 MG capsule Take 60 mg by mouth daily.      Marland Kitchen estradiol (ESTRACE) 1 MG tablet Take 1 mg by mouth daily.      . hydrochlorothiazide (HYDRODIURIL) 25 MG tablet Take 25 mg by mouth daily.      Marland Kitchen levETIRAcetam (KEPPRA) 500 MG tablet Take 1 tablet (500 mg total) by mouth 2 (two) times daily.  60 tablet  2  . oxyCODONE-acetaminophen (PERCOCET) 7.5-325 MG per tablet Take 1 tablet by mouth every 4 (four) hours as needed. For pain      . pantoprazole (PROTONIX) 40 MG tablet Take 40 mg by mouth daily.      Marland Kitchen rOPINIRole (REQUIP) 1 MG tablet Take 1 mg by mouth 4 (four) times daily.       No current facility-administered medications for this visit.   Family History  Problem Relation Age of Onset  . Hyperlipidemia Mother   . Hyperlipidemia Father   . Hypertension Mother   . Hypertension Father    History   Social History  . Marital Status: Married    Spouse Name: N/A    Number of Children: N/A  . Years of Education: N/A   Social History Main Topics  . Smoking status: Current Every Day Smoker  . Smokeless tobacco: Not on file     Comment: DOWN TO 1.5PACK DAY  . Alcohol Use: No  . Drug Use: No  . Sexual Activity: Not on file   Other Topics Concern  . Not on file   Social History Narrative  . No narrative on file   Review of  Systems: Review of Systems  Constitutional: Negative.   HENT: Positive for neck pain.   Respiratory: Negative for cough and shortness of breath.   Cardiovascular: Negative for chest pain and palpitations.  Gastrointestinal: Negative for nausea, vomiting, abdominal pain, diarrhea and constipation.  Genitourinary: Negative for dysuria.  Skin: Negative for rash.  Neurological: Positive for sensory change, focal weakness and seizures.  Psychiatric/Behavioral: Positive for depression. The patient has insomnia.     Objective:  Physical Exam: Filed Vitals:   09/04/12 0945  BP: 127/81  Pulse: 91  Temp: 97.1 F (36.2 C)  TempSrc: Oral  Height: 5\' 2"  (1.575 m)  Weight: 200 lb 3.2 oz (90.81 kg)  SpO2: 95%   Physical Exam  Constitutional: She is oriented to person, place, and time and well-developed, well-nourished, and in no distress.  HENT:  Head: Normocephalic and atraumatic.  Eyes: Conjunctivae and EOM are normal. Pupils are equal, round, and reactive to light.  Neck: Neck supple.  Cardiovascular: Normal rate, regular rhythm, normal heart sounds and intact distal pulses.   Pulmonary/Chest: Effort normal and breath sounds normal.  Abdominal: Soft. Bowel sounds are normal.  Neurological: She is  alert and oriented to person, place, and time. She displays facial symmetry and normal speech. A sensory deficit is present. Gait normal.  Reflex Scores:      Bicep reflexes are 2+ on the right side and 2+ on the left side.      Brachioradialis reflexes are 2+ on the right side and 2+ on the left side.      Patellar reflexes are 0 on the right side and 0 on the left side.      Achilles reflexes are 0 on the right side and 0 on the left side. Skin: Skin is warm and dry.  Psychiatric: Her mood appears anxious.    Assessment & Plan:

## 2012-09-04 NOTE — Patient Instructions (Addendum)
Please schedule a follow up visit with your PCP, Dr. Vear Clock in [place]  1. Please keep your appt with Dr. Marjory Lies, neurology

## 2012-09-04 NOTE — Assessment & Plan Note (Addendum)
Pt was discharged recently from [facility] for slurred speech, L hand weakness, facial numbness and droop, along with a possible seizure. CT scan showed mild patchy supratentorial small vessel disease, but no mass, hemorrhage, or acute appearing infarct.  She refused to have an MRI.  Neurology states exam is concerning for psychogenic etiology to her current weakness.  She had an abnormal EEG in the past that showed left temporal spikes and irritability consistent with right arm rhythmic activity.  He started the pt on Keppra 500mg  bid po at discharge.  She is to follow up with Dr. Marjory Lies on 09/28/12.  She has a PCP, Dr. Loma Sender in [place] that she would like to continue seeing.  On physical exam, VS are wnl.  Pt exhibits decreased sensation and strength on the left side in upper and lower extremities.  However this appears to be consistent with previous findings and there is no acute change.  Pt states she is only taking 500mg  of Keppra because it is making her too sleepy during the day.  Pt reports being under a tremendous amount of stress recently and she doesn't sleep well at night.  She reports that her father recently passed away and that she was his primary caregiver.  She appears anxious during the exam and I agree that her symptoms are consistent with a psychogenic etiology.    -continue taking current medications -f/u with Dr. Marjory Lies -pt offered flu shot but declined

## 2012-09-28 ENCOUNTER — Ambulatory Visit (INDEPENDENT_AMBULATORY_CARE_PROVIDER_SITE_OTHER): Payer: Medicare PPO | Admitting: Diagnostic Neuroimaging

## 2012-09-28 ENCOUNTER — Encounter: Payer: Self-pay | Admitting: Diagnostic Neuroimaging

## 2012-09-28 VITALS — BP 177/90 | HR 92 | Temp 98.1°F | Ht 62.25 in | Wt 201.0 lb

## 2012-09-28 DIAGNOSIS — G40209 Localization-related (focal) (partial) symptomatic epilepsy and epileptic syndromes with complex partial seizures, not intractable, without status epilepticus: Secondary | ICD-10-CM

## 2012-09-28 DIAGNOSIS — G40109 Localization-related (focal) (partial) symptomatic epilepsy and epileptic syndromes with simple partial seizures, not intractable, without status epilepticus: Secondary | ICD-10-CM | POA: Insufficient documentation

## 2012-09-28 MED ORDER — LEVETIRACETAM 500 MG PO TABS: 500.0000 mg | ORAL_TABLET | Freq: Two times a day (BID) | ORAL | Status: DC

## 2012-09-28 NOTE — Progress Notes (Signed)
[facility]  PATIENT: [name] DOB: [birth year]  REFERRING CLINICIAN: Yetta Barre HISTORY FROM: patient REASON FOR VISIT: follow up   HISTORICAL  CHIEF COMPLAINT:  Chief Complaint  Patient presents with  . Seizures    NP#6  ER referral    HISTORY OF PRESENT ILLNESS:   UPDATE 09/28/12: Since last visit, patient did not follow up in our clinic. Then was taken off lamictal by her PCP. In Aug 2014, went to ER for left face and left arm weakness and confusion. Suspcious for psychogenic spell, but also started on LEV 500mg  BID. Has had other spells where she zones out, stares, sometimes can hear but cannot talk. Auras include anxious feeling.   UPDATE 12/18/11: She is doing better, she has had 2 episodes of intermittent tremors while sleeping.  She knows when this is coming on.  She is taking 25mg  Lamictal at qhs, has not increased dose secondary to increased sleepiness.  Her father passed away in Nov 14, 2011.    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  Patient reports intermittent tremors in her bilateral upper and bilateral lower extremities, sometimes with loss of consciousness symptoms without loss of consciousness. Tremors are intermittent, usually triggered by lack of sleep. Patient states that she has difficulty sleeping because of her chronic pain. She is followed and managed for chronic pain by Dr. Vear Clock.  PRIOR HPI (Dr. Charleston Ropes last visit note): now on CPAP x2 wks, "hard to get used to"-sleep a little better w/ CPAP, not as drowsy during day but still can fall asleep in day- problems w/ legs main c/o today- pain in feet, legs, feet feel "like sandpaper", itching sensations R>L- not noticed loss of sensation in feet- trouble keeping them still- husband notes much leg mvt at night- helps to get up and walk around-  ongoing LBP, no radicular pain- tolerated amitriptyline poorly (dizziness)   REVIEW OF SYSTEMS: Full 14 system review of systems performed and notable only for fatigue swelling in legs trouble falling cough urination from joint pain joint swelling cramps a muscles are grossly difficulty swallowing restless legs headache numbness weakness.  ALLERGIES: Allergies  Allergen Reactions  . Codeine Other (See Comments)    Reaction unknown  . Sulfonamide Derivatives Other (See Comments)    Reaction unknown    HOME MEDICATIONS: Prior to Admission medications   Medication Sig Start Date End Date Taking? Authorizing Provider  albuterol (PROVENTIL HFA;VENTOLIN HFA) 108 (90 BASE) MCG/ACT inhaler Inhale 2 puffs into the lungs at bedtime.   Yes Historical Provider, MD  aspirin 81 MG tablet Take 81 mg by mouth daily.   Yes Historical Provider, MD  DULoxetine (CYMBALTA) 60 MG capsule Take 60 mg by mouth daily.   Yes Historical Provider, MD  estradiol (ESTRACE) 1 MG tablet Take 1 mg by mouth daily.   Yes Historical Provider, MD  hydrochlorothiazide (HYDRODIURIL) 25 MG tablet Take 25 mg by mouth daily.   Yes Historical Provider, MD  levETIRAcetam (KEPPRA) 500 MG tablet Take 1 tablet (500 mg total) by mouth 2 (two) times daily. 09/28/12  Yes Suanne Marker, MD  oxyCODONE-acetaminophen (PERCOCET) 7.5-325 MG per tablet Take 1 tablet by mouth every 4 (four) hours as needed. For pain   Yes Historical Provider, MD  pantoprazole (PROTONIX) 40 MG tablet Take 40 mg by mouth daily.   Yes Historical Provider, MD  rOPINIRole (  REQUIP) 1 MG tablet Take 1 mg by mouth 4 (four) times daily.   Yes Historical Provider, MD   Outpatient Prescriptions Prior to Visit  Medication Sig Dispense Refill  . albuterol (PROVENTIL HFA;VENTOLIN HFA) 108 (90 BASE) MCG/ACT inhaler Inhale 2 puffs into the lungs at bedtime.      Marland Kitchen aspirin 81 MG tablet Take 81 mg by mouth daily.      . DULoxetine (CYMBALTA) 60 MG capsule Take 60 mg by  mouth daily.      Marland Kitchen estradiol (ESTRACE) 1 MG tablet Take 1 mg by mouth daily.      . hydrochlorothiazide (HYDRODIURIL) 25 MG tablet Take 25 mg by mouth daily.      Marland Kitchen oxyCODONE-acetaminophen (PERCOCET) 7.5-325 MG per tablet Take 1 tablet by mouth every 4 (four) hours as needed. For pain      . pantoprazole (PROTONIX) 40 MG tablet Take 40 mg by mouth daily.      Marland Kitchen rOPINIRole (REQUIP) 1 MG tablet Take 1 mg by mouth 4 (four) times daily.      Marland Kitchen levETIRAcetam (KEPPRA) 500 MG tablet Take 1 tablet (500 mg total) by mouth 2 (two) times daily.  60 tablet  2   No facility-administered medications prior to visit.    PAST MEDICAL HISTORY: Past Medical History  Diagnosis Date  . Hypertension   . Cancer   . Seizures     PAST SURGICAL HISTORY: Past Surgical History  Procedure Laterality Date  . Back surgery    . Neck surgery    . Toe surgery    . Hand surgery    . Cholecystectomy    . Knee surgery    . Total hip arthroplasty Left   . Cesarean section    . Hemorrhoid surgery    . Vaginal hysterectomy    . Tubal ligation      FAMILY HISTORY: Family History  Problem Relation Age of Onset  . Hyperlipidemia Mother   . Hypertension Mother   . Hyperlipidemia Father   . Hypertension Father     SOCIAL HISTORY:  History   Social History  . Marital Status: Married    Spouse Name: N/A    Number of Children: N/A  . Years of Education: N/A   Occupational History  . Not on file.   Social History Main Topics  . Smoking status: Current Every Day Smoker  . Smokeless tobacco: Not on file     Comment: DOWN TO 1.5PACK DAY  . Alcohol Use: No  . Drug Use: No  . Sexual Activity: Not on file   Other Topics Concern  . Not on file   Social History Narrative  . No narrative on file     PHYSICAL EXAM  Filed Vitals:   09/28/12 0957  BP: 177/90  Pulse: 92  Temp: 98.1 F (36.7 C)  TempSrc: Oral  Height: 5' 2.25" (1.581 m)  Weight: 201 lb (91.173 kg)    Not recorded    Body  mass index is 36.48 kg/(m^2).  GENERAL EXAM: Patient is in no distress  CARDIOVASCULAR: Regular rate and rhythm, no murmurs, no carotid bruits  NEUROLOGIC: MENTAL STATUS: awake, alert, language fluent, comprehension intact, naming intact CRANIAL NERVE: pupils equal and reactive to light, visual fields full to confrontation, extraocular muscles intact, no nystagmus, facial sensation and strength symmetric, uvula midline, shoulder shrug symmetric, tongue midline. MOTOR: normal bulk and tone, full strength in the BUE, BLE SENSORY: normal and symmetric to light touch, pinprick, temperature,  vibration COORDINATION: finger-nose-finger, fine finger movements normal REFLEXES: deep tendon reflexes present and symmetric GAIT/STATION: narrow based gait; able to walk on toes, heels and tandem; romberg is negative   DIAGNOSTIC DATA (LABS, IMAGING, TESTING) - I reviewed patient records, labs, notes, testing and imaging myself where available.  Lab Results  Component Value Date   WBC 5.7 08/25/2012   HGB 14.0 08/25/2012   HCT 42.1 08/25/2012   MCV 84.4 08/25/2012   PLT 203 08/25/2012      Component Value Date/Time   NA 136 08/26/2012 0655   K 3.5 08/26/2012 0655   CL 99 08/26/2012 0655   CO2 26 08/26/2012 0655   GLUCOSE 117* 08/26/2012 0655   BUN 9 08/26/2012 0655   CREATININE 0.69 08/26/2012 0655   CALCIUM 9.0 08/26/2012 0655   PROT 7.0 08/24/2012 1251   ALBUMIN 3.4* 08/24/2012 1251   AST 19 08/24/2012 1251   ALT 16 08/24/2012 1251   ALKPHOS 124* 08/24/2012 1251   BILITOT 0.2* 08/24/2012 1251   GFRNONAA >90 08/26/2012 0655   GFRAA >90 08/26/2012 0655   Lab Results  Component Value Date   CHOL 163 08/24/2012   HDL 39* 08/24/2012   LDLCALC 96 08/24/2012   TRIG 142 08/24/2012   CHOLHDL 4.2 08/24/2012   Lab Results  Component Value Date   HGBA1C 6.5* 08/24/2012   No results found for this basename: [id number]   Lab Results  Component Value Date   TSH 1.580 08/24/2012    11/12/11 EEG - possible  left temporal partial seizure (55 seconds), with rhythmic sharp waves, spike and wave potentials, and phase reversals at T3 electrode. During this time patient has symptoms of right arm and right neck movements.  08/24/12 CT head - Mild patchy supratentorial small vessel disease. No demonstrable mass, hemorrhage, or acute appearing infarct.   ASSESSMENT AND PLAN  62 y.o. 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 Cannot have MRI due to spinal cord stimulator (per patient).  PLAN: -advised to take full dose of LEV 500mg  BID as directed (usually takes half tab BID) -avoid seizure provoking activity (skipping doses, lack of sleep, alcohol intake, fever) -no driving until event free x 6 months (patient has some impaired consciousness/amnesia with spells)  Meds ordered this encounter  Medications  . levETIRAcetam (KEPPRA) 500 MG tablet    Sig: Take 1 tablet (500 mg total) by mouth 2 (two) times daily.    Dispense:  60 tablet    Refill:  12   Return in about 6 months (around 03/28/2013) for with Heide Guile or Penumalli.    Suanne Marker, MD 09/28/2012, 11:27 AM Certified in Neurology, Neurophysiology and Neuroimaging  [facility] [address], [address] [place], [place] [address] [phone]

## 2012-09-28 NOTE — Patient Instructions (Signed)
No driving until seizure free x 6 months. Last event 08/24/12.

## 2012-10-05 ENCOUNTER — Telehealth: Payer: Self-pay | Admitting: Diagnostic Neuroimaging

## 2012-10-05 NOTE — Telephone Encounter (Signed)
Return pt call, there was no answer, left message stating that her paperwork had been faxed over to [facility] today.

## 2012-10-15 ENCOUNTER — Telehealth: Payer: Self-pay | Admitting: Diagnostic Neuroimaging

## 2012-10-16 NOTE — Telephone Encounter (Signed)
I will consult with Dr. Marjory Lies.   (Notes speak only of seizure).

## 2012-10-20 ENCOUNTER — Telehealth: Payer: Self-pay | Admitting: *Deleted

## 2012-10-20 NOTE — Telephone Encounter (Signed)
Cordelia Pen called from Dr. Carlos Levering office stating if Dr. Marjory Lies would write letter stating he is treating her for seizures and not stroke.  Trying to get pt scheduled for surgery.

## 2013-01-14 DIAGNOSIS — I639 Cerebral infarction, unspecified: Secondary | ICD-10-CM

## 2013-01-14 HISTORY — DX: Cerebral infarction, unspecified: I63.9

## 2013-01-16 ENCOUNTER — Other Ambulatory Visit (HOSPITAL_COMMUNITY): Payer: Self-pay | Admitting: Internal Medicine

## 2013-03-15 ENCOUNTER — Encounter (HOSPITAL_COMMUNITY): Payer: Self-pay | Admitting: Emergency Medicine

## 2013-03-15 ENCOUNTER — Emergency Department (HOSPITAL_COMMUNITY)
Admission: EM | Admit: 2013-03-15 | Discharge: 2013-03-15 | Disposition: A | Discharge status: Home / Self Care | Payer: Medicare PPO | Source: Home / Self Care | Attending: Family Medicine | Admitting: Family Medicine

## 2013-03-15 DIAGNOSIS — J40 Bronchitis, not specified as acute or chronic: Secondary | ICD-10-CM

## 2013-03-15 DIAGNOSIS — J441 Chronic obstructive pulmonary disease with (acute) exacerbation: Secondary | ICD-10-CM

## 2013-03-15 MED ORDER — IPRATROPIUM-ALBUTEROL 0.5-2.5 (3) MG/3ML IN SOLN
3.0000 mL | Freq: Once | RESPIRATORY_TRACT | Status: AC
  Administered 2013-03-15: 3 mL via RESPIRATORY_TRACT

## 2013-03-15 MED ORDER — PREDNISONE 50 MG PO TABS: 50.0000 mg | ORAL_TABLET | Freq: Every day | ORAL | Status: DC

## 2013-03-15 MED ORDER — ALBUTEROL SULFATE (2.5 MG/3ML) 0.083% IN NEBU
INHALATION_SOLUTION | RESPIRATORY_TRACT | Status: AC
Start: 2013-03-15 — End: 2013-03-15
  Filled 2013-03-15: qty 6

## 2013-03-15 MED ORDER — ALBUTEROL SULFATE HFA 108 (90 BASE) MCG/ACT IN AERS: 2.0000 | INHALATION_SPRAY | Freq: Four times a day (QID) | RESPIRATORY_TRACT | Status: DC | PRN

## 2013-03-15 MED ORDER — BENZONATATE 100 MG PO CAPS: 100.0000 mg | ORAL_CAPSULE | Freq: Three times a day (TID) | ORAL | Status: DC

## 2013-03-15 MED ORDER — IPRATROPIUM BROMIDE 0.02 % IN SOLN
RESPIRATORY_TRACT | Status: AC
  Filled 2013-03-15: qty 2.5

## 2013-03-15 MED ORDER — AZITHROMYCIN 250 MG PO TABS: 250.0000 mg | ORAL_TABLET | Freq: Every day | ORAL | Status: DC

## 2013-03-15 NOTE — Discharge Instructions (Signed)
Thank you for coming in today. Call or go to the emergency room if you get worse, have trouble breathing, have chest pains, or palpitations.  Followup with your doctor Soon.

## 2013-03-15 NOTE — ED Provider Notes (Signed)
[name] is a 63 y.o. female who presents to Urgent Care today for headache cough congestion and wheezing. Symptoms present for one week. Patient has frequent episodes of smoking-related bronchitis. She's tried amoxicillin Tessalon which has not been very effective. No nausea vomiting or diarrhea. No chest pains or palpitations.   Past Medical History  Diagnosis Date  . Hypertension   . Cancer   . Seizures    History  Substance Use Topics  . Smoking status: Current Every Day Smoker  . Smokeless tobacco: Not on file     Comment: DOWN TO 1.5PACK DAY  . Alcohol Use: No   ROS as above Medications: No current facility-administered medications for this encounter.   Current Outpatient Prescriptions  Medication Sig Dispense Refill  . albuterol (PROVENTIL HFA;VENTOLIN HFA) 108 (90 BASE) MCG/ACT inhaler Inhale 2 puffs into the lungs at bedtime.      Marland Kitchen aspirin 81 MG tablet Take 81 mg by mouth daily.      . DULoxetine (CYMBALTA) 60 MG capsule Take 60 mg by mouth daily.      Marland Kitchen estradiol (ESTRACE) 1 MG tablet Take 1 mg by mouth daily.      . hydrochlorothiazide (HYDRODIURIL) 25 MG tablet Take 25 mg by mouth daily.      Marland Kitchen levETIRAcetam (KEPPRA) 500 MG tablet Take 1 tablet (500 mg total) by mouth 2 (two) times daily.  60 tablet  12  . oxyCODONE-acetaminophen (PERCOCET) 7.5-325 MG per tablet Take 1 tablet by mouth every 4 (four) hours as needed. For pain      . pantoprazole (PROTONIX) 40 MG tablet Take 40 mg by mouth daily.      Marland Kitchen rOPINIRole (REQUIP) 1 MG tablet Take 1 mg by mouth 4 (four) times daily.        Exam:  BP 168/88  Pulse 95  Temp(Src) 98.8 F (37.1 C) (Oral)  Resp 16  SpO2 97% Gen: Well NAD HEENT: EOMI,  MMM normal tympanic membranes bilaterally Lungs: Normal work of breathing. Coarse breath sounds wheezing present bilaterally. Heart: RRR no MRG Abd: NABS, Soft. NT, ND Exts: Brisk capillary refill, warm and well perfused.   Patient was given a DuoNeb nebulizer  treatment and felt better   Assessment and Plan: 63 y.o. female with bronchitis and COPD exacerbation. Plan to treat with prednisone, albuterol, benzoate, azithromycin. Followup with primary care provider. Encourage smoking cessation.  Discussed warning signs or symptoms. Please see discharge instructions. Patient expresses understanding.    Gregor Hams, MD 03/15/13 [phone]

## 2013-03-15 NOTE — ED Notes (Signed)
Here w spouse, both ill w similar syx

## 2013-07-19 ENCOUNTER — Other Ambulatory Visit (HOSPITAL_COMMUNITY): Payer: Self-pay | Admitting: Internal Medicine

## 2013-08-13 ENCOUNTER — Other Ambulatory Visit: Payer: Self-pay | Admitting: Anesthesiology

## 2013-08-13 ENCOUNTER — Ambulatory Visit
Admission: RE | Admit: 2013-08-13 | Discharge: 2013-08-13 | Disposition: A | Discharge status: Home / Self Care | Payer: Medicare PPO | Source: Ambulatory Visit | Attending: Anesthesiology | Admitting: Anesthesiology

## 2013-08-13 DIAGNOSIS — M545 Low back pain: Secondary | ICD-10-CM

## 2013-09-21 ENCOUNTER — Other Ambulatory Visit (HOSPITAL_COMMUNITY): Payer: Self-pay | Admitting: Internal Medicine

## 2013-10-11 ENCOUNTER — Other Ambulatory Visit (HOSPITAL_COMMUNITY): Payer: Self-pay | Admitting: Internal Medicine

## 2013-10-11 ENCOUNTER — Other Ambulatory Visit (HOSPITAL_COMMUNITY): Payer: Self-pay | Admitting: Diagnostic Neuroimaging

## 2013-10-11 NOTE — Telephone Encounter (Signed)
i have called the pharmacy and given them the name of her pcp and neurologist

## 2013-10-29 ENCOUNTER — Telehealth: Payer: Self-pay | Admitting: Diagnostic Neuroimaging

## 2013-10-29 NOTE — Telephone Encounter (Signed)
patient needs follow up appt for chronic back pain and numbness in the lower extremities on referral from [facility] Dr Nicholaus Bloom. Please schedule with Dr Leta Baptist.  Please call patient home [phone]  Cell [phone]

## 2013-11-01 NOTE — Telephone Encounter (Signed)
Called patient on both numbers. No answer.

## 2013-11-02 NOTE — Telephone Encounter (Signed)
Patient returning call.  Please call [phone].

## 2013-11-03 NOTE — Telephone Encounter (Signed)
Spoke to patient. Scheduled NCxPt appt.

## 2013-11-05 ENCOUNTER — Ambulatory Visit (INDEPENDENT_AMBULATORY_CARE_PROVIDER_SITE_OTHER): Payer: Medicare PPO | Admitting: Diagnostic Neuroimaging

## 2013-11-05 ENCOUNTER — Encounter: Payer: Self-pay | Admitting: Diagnostic Neuroimaging

## 2013-11-05 VITALS — BP 137/80 | HR 84 | Ht 61.0 in | Wt 205.2 lb

## 2013-11-05 DIAGNOSIS — R55 Syncope and collapse: Secondary | ICD-10-CM

## 2013-11-05 DIAGNOSIS — G40209 Localization-related (focal) (partial) symptomatic epilepsy and epileptic syndromes with complex partial seizures, not intractable, without status epilepticus: Secondary | ICD-10-CM

## 2013-11-05 DIAGNOSIS — G40109 Localization-related (focal) (partial) symptomatic epilepsy and epileptic syndromes with simple partial seizures, not intractable, without status epilepticus: Secondary | ICD-10-CM

## 2013-11-05 MED ORDER — LEVETIRACETAM 500 MG PO TABS: 1000.0000 mg | ORAL_TABLET | Freq: Two times a day (BID) | ORAL | Status: DC

## 2013-11-05 NOTE — Progress Notes (Signed)
[facility]  PATIENT: [name] DOB: 08-10-1950  REFERRING CLINICIAN: Eudelia Bunch HISTORY FROM: patient REASON FOR VISIT: follow up   HISTORICAL  CHIEF COMPLAINT:  Chief Complaint  Patient presents with  . Follow-up    epilepsy    HISTORY OF PRESENT ILLNESS:   UPDATE 11/05/13: Since last visit, no seizures with loss of consciousness. Continues on LEV 500mg  BID. Continues with pain mgmt (Dr. Hardin Negus). Now having new events of suddenly falling to ground without LOC. She feels a warning / wave come over her, then she gets "noodle legs" and falls to the ground. She falls 2-3 x per month.   UPDATE 09/28/12: Since last visit, patient did not follow up in our clinic. Then was taken off lamictal by her PCP. In Aug 2014, went to ER for left face and left arm weakness and confusion. Suspcious for psychogenic spell, but also started on LEV 500mg  BID. Has had other spells where she zones out, stares, sometimes can hear but cannot talk. Auras include anxious feeling.   UPDATE 12/18/11: She is doing better, she has had 2 episodes of intermittent tremors while sleeping.  She knows when this is coming on.  She is taking 25mg  Lamictal at qhs, has not increased dose secondary to increased sleepiness.  Her father passed away in 2011/11/16.    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 Patient reports intermittent tremors in her bilateral upper and bilateral lower extremities, sometimes with loss of consciousness symptoms without loss of consciousness. Tremors are intermittent, usually triggered by lack of sleep. Patient states that she has difficulty sleeping because of her chronic pain. She is followed and managed for chronic pain by Dr. Hardin Negus.  PRIOR HPI (03/03/09, Dr. Mendel Corning): Now on CPAP x2 wks, "hard to get used  to"-sleep a little better w/ CPAP, not as drowsy during day but still can fall asleep in day- problems w/ legs main c/o today- pain in feet, legs, feet feel "like sandpaper", itching sensations R>L- not noticed loss of sensation in feet- trouble keeping them still- husband notes much leg mvt at night- helps to get up and walk around- ongoing LBP, no radicular pain- tolerated amitriptyline poorly (dizziness)   REVIEW OF SYSTEMS: Full 14 system review of systems performed and notable only for numbness waekness diff swallowing fatigue swelling in legs.    ALLERGIES: Allergies  Allergen Reactions  . Codeine Other (See Comments)    Reaction unknown  . Sulfonamide Derivatives Other (See Comments)    Reaction unknown    HOME MEDICATIONS: Outpatient Prescriptions Prior to Visit  Medication Sig Dispense Refill  . albuterol (PROVENTIL HFA;VENTOLIN HFA) 108 (90 BASE) MCG/ACT inhaler Inhale 2 puffs into the lungs every 6 (six) hours as needed for wheezing or shortness of breath.  1 Inhaler  1  . aspirin 81 MG tablet Take 81 mg by mouth daily.      . DULoxetine (CYMBALTA) 60 MG capsule Take 60 mg by mouth daily.      Marland Kitchen estradiol (ESTRACE) 1 MG tablet Take 1 mg by mouth daily. Tapering down      . hydrochlorothiazide (HYDRODIURIL) 25 MG tablet Take 25 mg by mouth daily.      Marland Kitchen oxyCODONE-acetaminophen (PERCOCET) 7.5-325 MG per tablet Take 1 tablet by mouth every 4 (four) hours as needed. For pain      .  pantoprazole (PROTONIX) 40 MG tablet Take 40 mg by mouth daily.      Marland Kitchen rOPINIRole (REQUIP) 1 MG tablet Take 1 mg by mouth 4 (four) times daily.      Marland Kitchen levETIRAcetam (KEPPRA) 500 MG tablet TAKE 1 TABLET (500 MG TOTAL) BY MOUTH 2 (TWO) TIMES DAILY.  60 tablet  0  . azithromycin (ZITHROMAX) 250 MG tablet Take 1 tablet (250 mg total) by mouth daily. Take first 2 tablets together, then 1 every day until finished.  6 tablet  0  . benzonatate (TESSALON) 100 MG capsule Take 1 capsule (100 mg total) by mouth  every 8 (eight) hours.  21 capsule  0  . predniSONE (DELTASONE) 50 MG tablet Take 1 tablet (50 mg total) by mouth daily.  5 tablet  0   No facility-administered medications prior to visit.    PAST MEDICAL HISTORY: Past Medical History  Diagnosis Date  . Hypertension   . Cancer   . Seizures     PAST SURGICAL HISTORY: Past Surgical History  Procedure Laterality Date  . Back surgery    . Neck surgery    . Toe surgery    . Hand surgery    . Cholecystectomy    . Knee surgery    . Total hip arthroplasty Left   . Cesarean section    . Hemorrhoid surgery    . Vaginal hysterectomy    . Tubal ligation      FAMILY HISTORY: Family History  Problem Relation Age of Onset  . Hyperlipidemia Mother   . Hypertension Mother   . Hyperlipidemia Father   . Hypertension Father     SOCIAL HISTORY:  History   Social History  . Marital Status: Married    Spouse Name: N/A    Number of Children: N/A  . Years of Education: N/A   Occupational History  . Not on file.   Social History Main Topics  . Smoking status: Current Every Day Smoker  . Smokeless tobacco: Not on file     Comment: DOWN TO 1.5PACK DAY  . Alcohol Use: No  . Drug Use: No  . Sexual Activity: Not on file   Other Topics Concern  . Not on file   Social History Narrative  . No narrative on file     PHYSICAL EXAM  Filed Vitals:   11/05/13 1042  BP: 137/80  Pulse: 84  Height: 5\' 1"  (1.549 m)  Weight: 205 lb 3.2 oz (93.078 kg)    Not recorded    Body mass index is 38.79 kg/(m^2).  GENERAL EXAM: Patient is in no distress  CARDIOVASCULAR: Regular rate and rhythm, no murmurs, no carotid bruits  NEUROLOGIC: MENTAL STATUS: awake, alert, language fluent, comprehension intact, naming intact CRANIAL NERVE: pupils equal and reactive to light, visual fields full to confrontation, extraocular muscles intact, no nystagmus, facial sensation and strength symmetric, uvula midline, shoulder shrug symmetric, tongue  midline. MOTOR: normal bulk and tone, full strength in the RUE AND RLE; LUE 4, LLE 3-4 SENSORY: normal and symmetric to light touch, temperature, vibration COORDINATION: finger-nose-finger, fine finger movements normal REFLEXES: deep tendon reflexes present and symmetric GAIT/STATION: ANTALGIC, LIMPING GAIT DUE TO BACK PAIN AND LEFT LEG PAIN   DIAGNOSTIC DATA (LABS, IMAGING, TESTING) - I reviewed patient records, labs, notes, testing and imaging myself where available.  Lab Results  Component Value Date   WBC 5.7 08/25/2012   HGB 14.0 08/25/2012   HCT 42.1 08/25/2012   MCV 84.4 08/25/2012  PLT 203 08/25/2012      Component Value Date/Time   NA 136 08/26/2012 0655   K 3.5 08/26/2012 0655   CL 99 08/26/2012 0655   CO2 26 08/26/2012 0655   GLUCOSE 117* 08/26/2012 0655   BUN 9 08/26/2012 0655   CREATININE 0.69 08/26/2012 0655   CALCIUM 9.0 08/26/2012 0655   PROT 7.0 08/24/2012 1251   ALBUMIN 3.4* 08/24/2012 1251   AST 19 08/24/2012 1251   ALT 16 08/24/2012 1251   ALKPHOS 124* 08/24/2012 1251   BILITOT 0.2* 08/24/2012 1251   GFRNONAA >90 08/26/2012 0655   GFRAA >90 08/26/2012 0655   Lab Results  Component Value Date   CHOL 163 08/24/2012   HDL 39* 08/24/2012   LDLCALC 96 08/24/2012   TRIG 142 08/24/2012   CHOLHDL 4.2 08/24/2012   Lab Results  Component Value Date   HGBA1C 6.5* 08/24/2012   No results found for this basename: [id number]   Lab Results  Component Value Date   TSH 1.580 08/24/2012    11/12/11 EEG - possible left temporal partial seizure (55 seconds), with rhythmic sharp waves, spike and wave potentials, and phase reversals at T3 electrode. During this time patient has symptoms of right arm and right neck movements.  08/24/12 CT head - Mild patchy supratentorial small vessel disease. No demonstrable mass, hemorrhage, or acute appearing infarct.   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 Now with intermittent episodes of falling  down, which may be epileptic drop attacks. Cannot have MRI due to spinal cord stimulator (per patient).  PLAN: - increase LEV to 1000mg  BID  Meds ordered this encounter  Medications  . levETIRAcetam (KEPPRA) 500 MG tablet    Sig: Take 2 tablets (1,000 mg total) by mouth 2 (two) times daily.    Dispense:  120 tablet    Refill:  12    PLEASE SCHEDULE APPT   Return in about 3 months (around 02/05/2014).    Penni Bombard, MD 38/46/6599, 35:70 AM Certified in Neurology, Neurophysiology and Neuroimaging  [facility] [address] [place], [place] [address] [phone]

## 2013-11-05 NOTE — Patient Instructions (Signed)
Increase levetiracetam to 1000mg twice a day. 

## 2013-11-08 ENCOUNTER — Other Ambulatory Visit (HOSPITAL_COMMUNITY): Payer: Self-pay | Admitting: Diagnostic Neuroimaging

## 2014-01-11 ENCOUNTER — Encounter: Payer: Self-pay | Admitting: Neurology

## 2014-01-11 ENCOUNTER — Ambulatory Visit (INDEPENDENT_AMBULATORY_CARE_PROVIDER_SITE_OTHER): Payer: Medicare PPO | Admitting: Neurology

## 2014-01-11 VITALS — BP 138/80 | HR 88 | Temp 98.5°F | Resp 16 | Ht 62.0 in | Wt 206.5 lb

## 2014-01-11 DIAGNOSIS — M6289 Other specified disorders of muscle: Secondary | ICD-10-CM

## 2014-01-11 DIAGNOSIS — R531 Weakness: Secondary | ICD-10-CM

## 2014-01-11 DIAGNOSIS — R55 Syncope and collapse: Secondary | ICD-10-CM

## 2014-01-11 DIAGNOSIS — R569 Unspecified convulsions: Secondary | ICD-10-CM

## 2014-01-11 NOTE — Progress Notes (Signed)
NEUROLOGY CONSULTATION NOTE  [name] MRN: [id number] DOB: 10-Jun-1950  Referring provider: Dr. Laurian Brim Primary care provider: Dr. Laurian Brim  Reason for consult:  Possible seizures  Dear Dr Hardin Negus:  Thank you for your kind referral of [name] for consultation of the above symptoms. Although her history is well known to you, please allow me to reiterate it for the purpose of our medical record. Records and images were personally reviewed where available.  HISTORY OF PRESENT ILLNESS: This is a 63 year old right-handed woman with a history of hypertension, chronic pain, RLS, who started having tremors in 2013 affecting both upper and lower extremities, usually triggered by lack of sleep. She reports she would shake, "like muscle spasms." She had a routine EEG which was reported as abnormal, with a 55 second period of rhythmic sharp waves and spike and wave discharges, in delta and theta ranges, in the left temporal region. Phase reversal noted at T3 electrode. She was started on Lamictal but did not tolerate higher doses and was discontinued in 2014. She had an episode of left face and arm weakness, confusion last August 2014, there is note of possible psychogenic event, however due to prior abnormal EEG, she was started on Keppra 500mg  BID. Dose was increased due to patient report of zoning out, staring, and inability to speak. She self-reduced dose to 500mg  BID due to occipital headaches/tenderness.   She reports that episodes now start with a wave going through her body which would drain her. This would be followed by losing feeling in one or both legs causing her to fall. After around 5 minutes, she can stand. She denies any associated leg or back pain. Last episode was before Christmas. She has the same sensation if she sits on the commode too long. If she stands too long, her legs would start to hurt. She reports the tremors still continue to  occur, affecting her hands. She feels she cannot speak but can hear people around her, denying any confusion. Last episode was 2 days ago. She reports that when she goes to the dentist, she has similar tremors. She reports the longest event-free period was 3 weeks, she has had 2 episodes this week. No significant change in episodes on Keppra. She reports feeling anxious all the time, and does tremble when scared.   She denies any olfactory/gustatory hallucinations, rising epigastric sensation, no generalized convulsions. She has occasional gaps in time, she would drive and look up and wonder if she ran a red light. She has been told she is not paying attention. She has near-daily headaches over the occipital and temporal regions, no associated nausea,vomiting, some phonophobia. She has chronic back pain s/p spinal stimulator.   Epilepsy Risk Factors:  Her half-brother has seizures. She had a concussion with brief loss of consciousness at age 10 or 50. Otherwise she had a normal birth and early development.  There is no history of febrile convulsions, CNS infections such as meningitis/encephalitis, significant traumatic brain injury, neurosurgical procedures.  I personally reviewed head CT done 08/2012 which did not show any acute changes, there was patchy small vessel disease changes in the centrum semiovale bilaterally  PAST MEDICAL HISTORY: Past Medical History  Diagnosis Date  . Hypertension   . Cancer   . Seizures   . Liver disease     PAST SURGICAL HISTORY: Past Surgical History  Procedure Laterality Date  . Back surgery    . Neck surgery    .  Toe surgery    . Hand surgery    . Cholecystectomy    . Knee surgery    . Total hip arthroplasty Left   . Cesarean section    . Hemorrhoid surgery    . Vaginal hysterectomy    . Tubal ligation      MEDICATIONS: Current Outpatient Prescriptions on File Prior to Visit  Medication Sig Dispense Refill  . albuterol (PROVENTIL HFA;VENTOLIN HFA)  108 (90 BASE) MCG/ACT inhaler Inhale 2 puffs into the lungs every 6 (six) hours as needed for wheezing or shortness of breath. 1 Inhaler 1  . aspirin 81 MG tablet Take 81 mg by mouth daily.    . DULoxetine (CYMBALTA) 60 MG capsule Take 60 mg by mouth daily.    Marland Kitchen estradiol (ESTRACE) 1 MG tablet Take 1 mg by mouth daily. Tapering down    . hydrochlorothiazide (HYDRODIURIL) 25 MG tablet Take 25 mg by mouth daily.    Marland Kitchen levETIRAcetam (KEPPRA) 500 MG tablet Take 2 tablets (1,000 mg total) by mouth 2 (two) times daily. 120 tablet 12  . levETIRAcetam (KEPPRA) 500 MG tablet Take 2 tablets (1,000 mg total) by mouth 2 (two) times daily. 120 tablet 11  . oxyCODONE-acetaminophen (PERCOCET) 7.5-325 MG per tablet Take 1 tablet by mouth every 4 (four) hours as needed. For pain    . pantoprazole (PROTONIX) 40 MG tablet Take 40 mg by mouth daily.    Marland Kitchen rOPINIRole (REQUIP) 1 MG tablet Take 1 mg by mouth 4 (four) times daily.     No current facility-administered medications on file prior to visit.    ALLERGIES: Allergies  Allergen Reactions  . Codeine Other (See Comments)    Reaction unknown  . Sulfonamide Derivatives Other (See Comments)    Reaction unknown    FAMILY HISTORY: Family History  Problem Relation Age of Onset  . Hyperlipidemia Mother   . Hypertension Mother   . Hyperlipidemia Father   . Hypertension Father   . Alzheimer's disease Father   . Stroke Brother   . Cancer Daughter     unknown    SOCIAL HISTORY: History   Social History  . Marital Status: Married    Spouse Name: N/A    Number of Children: N/A  . Years of Education: N/A   Occupational History  . Not on file.   Social History Main Topics  . Smoking status: Current Every Day Smoker  . Smokeless tobacco: Not on file     Comment: DOWN TO 1.5PACK DAY  . Alcohol Use: No  . Drug Use: No  . Sexual Activity: No   Other Topics Concern  . Not on file   Social History Narrative    REVIEW OF SYSTEMS: Constitutional:  No fevers, chills, or sweats, no generalized fatigue, change in appetite Eyes: No visual changes, double vision, eye pain Ear, nose and throat: No hearing loss, ear pain, nasal congestion, sore throat Cardiovascular: No chest pain, palpitations Respiratory:  No shortness of breath at rest or with exertion, wheezes GastrointestinaI: No nausea, vomiting, diarrhea, abdominal pain, fecal incontinence Genitourinary:  No dysuria, urinary retention or frequency Musculoskeletal:  + neck pain, back pain Integumentary: No rash, pruritus, skin lesions Neurological: as above Psychiatric: No depression, insomnia, anxiety Endocrine: No palpitations, fatigue, diaphoresis, mood swings, change in appetite, change in weight, increased thirst Hematologic/Lymphatic:  No anemia, purpura, petechiae. Allergic/Immunologic: no itchy/runny eyes, nasal congestion, recent allergic reactions, rashes  PHYSICAL EXAM: Filed Vitals:   01/11/14 1351  BP: 138/80  Pulse: 88  Temp: 98.5 F (36.9 C)  Resp: 16   General: No acute distress Head:  Normocephalic/atraumatic Eyes: Fundoscopic exam shows bilateral sharp discs, no vessel changes, exudates, or hemorrhages Neck: supple, no paraspinal tenderness, full range of motion Back: No paraspinal tenderness Heart: regular rate and rhythm Lungs: Clear to auscultation bilaterally. Vascular: No carotid bruits. Skin/Extremities: No rash, no edema Neurological Exam: Mental status: alert and oriented to person, place, and time, no dysarthria or aphasia, Fund of knowledge is appropriate.  Recent and remote memory are intact.  Attention and concentration are normal.    Able to name objects and repeat phrases. Cranial nerves: CN I: not tested CN II: pupils equal, round and reactive to light, visual fields intact, fundi unremarkable. CN III, IV, VI:  full range of motion, no nystagmus, no ptosis CN V: facial sensation intact CN VII: upper and lower face symmetric CN VIII:  hearing intact to finger rub CN IX, X: gag intact, uvula midline CN XI: sternocleidomastoid and trapezius muscles intact CN XII: tongue midline Bulk & Tone: normal, no fasciculations. Motor: 4/5 on left UE with giveway weakness however note of orbiting around the left UE (indicating mild left UE weakness); 3/5 left hip flexion, 2+/5 left knee extension, 2-3/5 left foot dorsi and plantar flexion; 5/5 on right UE and LE Sensation: intact to light touch, cold, pin, vibration and joint position sense.  No extinction to double simultaneous stimulation.  Romberg test negative Deep Tendon Reflexes: +2 throughout except for absent ankle jerks bilaterally, no ankle clonus Plantar responses: downgoing bilaterally Cerebellar: no incoordination on finger to nose testing. No dysdiadochokinesia Gait: drags left leg with poor foot clearance Tremor: negative  IMPRESSION: This is a 63 year old right-handed woman with hypertension, chronic pain, RLS, who started having tremors in 2013 affecting both upper and lower extremities. 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 She has been taking Keppra with no significant change in frequency of these spells. She has also been having episodes of leg numbness and weakness of unclear etiology, leading to falls. Exam shows left-sided weakness, with reduced left foot clearance. Head CT for left-sided weakness in 2014 was unremarkable. A 48-hour EEG will be ordered to further classify these episodes to help guide long-term medication management. Continue current dose of Keppra for now. [place] driving laws were discussed with the patient, and she knows to stop driving after an episode of loss of awareness, until 6 months event-free. She will follow-up after the EEG.   Thank you for allowing me to participate in the care of this patient. Please do not hesitate to call for any questions or concerns.   [name], M.D.  CC: Dr. Hardin Negus

## 2014-01-11 NOTE — Patient Instructions (Signed)
1. Schedule 48-hour EEG  2. Follow-up after EEG 3. Continue all medications for now 4. As per [place] driving laws, for any episode of loss of consciousness or awareness, one should not drive until 6 months event-free

## 2014-01-17 ENCOUNTER — Encounter: Payer: Self-pay | Admitting: Neurology

## 2014-01-17 DIAGNOSIS — R531 Weakness: Secondary | ICD-10-CM | POA: Insufficient documentation

## 2014-01-17 DIAGNOSIS — R569 Unspecified convulsions: Secondary | ICD-10-CM | POA: Insufficient documentation

## 2014-02-07 ENCOUNTER — Ambulatory Visit: Payer: Medicare PPO | Admitting: Diagnostic Neuroimaging

## 2014-04-11 ENCOUNTER — Telehealth: Payer: Self-pay | Admitting: Neurology

## 2014-04-12 ENCOUNTER — Ambulatory Visit (INDEPENDENT_AMBULATORY_CARE_PROVIDER_SITE_OTHER): Payer: Medicare PPO | Admitting: Neurology

## 2014-04-12 DIAGNOSIS — R569 Unspecified convulsions: Secondary | ICD-10-CM | POA: Diagnosis not present

## 2014-04-15 NOTE — Telephone Encounter (Signed)
error 

## 2014-04-19 ENCOUNTER — Ambulatory Visit: Payer: Medicare PPO | Admitting: Neurology

## 2014-04-19 DIAGNOSIS — Z029 Encounter for administrative examinations, unspecified: Secondary | ICD-10-CM

## 2014-04-20 ENCOUNTER — Encounter: Payer: Self-pay | Admitting: Neurology

## 2014-04-28 ENCOUNTER — Telehealth: Payer: Self-pay | Admitting: Neurology

## 2014-04-28 NOTE — Telephone Encounter (Signed)
Pt called wanting to f/u on the results for the EEG. C/b [phone]

## 2014-04-28 NOTE — Telephone Encounter (Signed)
Please review

## 2014-04-29 NOTE — Procedures (Signed)
ELECTROENCEPHALOGRAM REPORT  Dates of Recording: 04/12/2014 to 04/14/2014  Patient's Name: [name] MRN: [id number] Date of Birth: [birth year]  Referring Provider: Dr. [name]  Procedure: 48-hour ambulatory EEG  History: This is a 64 year old woman with hypertension, chronic pain, RLS, who started having tremors in 2013 affecting both upper and lower extremities. 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 She has been taking Keppra with no significant change in frequency of these spells. She has also been having episodes of leg numbness and weakness of unclear etiology, leading to falls. EEG for classification.  Medications: Keppra, Cymbalta, Requip  Technical Summary: This is a 48-hour multichannel digital EEG recording measured by the international 10-20 system with electrodes applied with paste and impedances below 5000 ohms performed as portable with EKG monitoring.  The digital EEG was referentially recorded, reformatted, and digitally filtered in a variety of bipolar and referential montages for optimal display.    DESCRIPTION OF RECORDING: During maximal wakefulness, the background activity consisted of a symmetric 9 vHz posterior dominant rhythm which was reactive to eye opening.  There were no epileptiform discharges or focal slowing seen in wakefulness.  During the recording, the patient progresses through wakefulness, drowsiness, and Stage 2 sleep.  Again, there were no epileptiform discharges seen.  Events: Typical episodes of left leg numbness and weakness were not reported. Patient reported her legs would shake on and off all day, she was up and down all night having cramps in feet, legs, hands, legs would shake off and on. There were no electrographic seizures seen in the study.  EKG lead was unremarkable.  IMPRESSION: This 48-hour ambulatory EEG study is normal.    CLINICAL CORRELATION: A normal EEG does not exclude a clinical  diagnosis of epilepsy. Typical episodes were not captured. On and off shaking of her legs and cramps in feet, legs, hands were reported, there were no epileptiform discharges seen in this study.    [name], M.D.

## 2014-04-29 NOTE — Telephone Encounter (Signed)
Patient notified patient already scheduled for follow up in June

## 2014-04-29 NOTE — Telephone Encounter (Signed)
Pls let her know the EEG was normal, no seizure discharges seen. She had missed her appointment, schedule f/u to discuss next steps. Thanks

## 2014-05-08 NOTE — Op Note (Signed)
PATIENT NAME:  [name], [name] MR#:  [id number] DATE OF BIRTH:  [birth year]  DATE OF PROCEDURE:  04/02/2011  PREOPERATIVE DIAGNOSES:  1. Urinary frequency.  2. Urge incontinence.   POSTOPERATIVE DIAGNOSES:  1. Urinary frequency.  2. Urge incontinence.   PROCEDURE: InterStim placement.   SURGEON: Edrick Oh, M.D.   ANESTHESIA: Laryngeal mask airway anesthesia.   INDICATIONS: The patient is a 64 year old white female with a long history of urinary frequency, urgency, and urge incontinence. She has been treated with medical management including conservative measures. She has failed all subsequent treatment options. She has undergone an InterStim trial with more than 50% improvement in her urinary symptoms. She presents for InterStim placement.   DESCRIPTION OF PROCEDURE: After informed consent was obtained, the patient was taken to the operating room and placed in the prone position on the operating table under laryngeal mask airway anesthesia. The patient was then prepped and draped in the usual standard fashion. The site from the previous lead test could be identified on the overlying skin. The skin was anesthetized in the region utilizing 1% lidocaine. A spinal needle was then advanced at the same site. It was advanced into the third sacral foramen on first attempt. Stimulation demonstrated great toe movement with good bellows. The obturator was placed through the needle. The needle was removed. The sheath was then placed over the obturator. A small skin incision was made prior to obturator placement. The sheath was left in place. The lead was advanced through the sheath. It was pulled back to the first stop. Stimulation demonstrated good response on one and two leads.  Some manipulation of the lead was undertaken to attempt better stimulation on at least three leads. This was unsuccessful. Good stimulation, however, was noted on the first and second leads. A pocket was then developed in the left  buttock. The lead was in the left third sacral foramen. Once the pocket was developed it was irrigated with ample amounts of antibiotic irrigating solution. A tunneler was then used to pass the lead from the sacral site to the pocket region. The lead was then connected to the generator.  It was placed into the pocket. A test was performed which demonstrated good parameters throughout. The stimulation settings were low. The pocket was then closed in two layers utilizing 2-0 Vicryl suture, and then 4-0 subcuticular stitch. The lead site was also closed utilizing 4-0 Vicryl subcuticular stitch. Steri-Strips, Telfa, and Tegaderm dressing was then applied. The patient was then returned to the supine position. She was awakened from general endotracheal anesthesia. She was taken to the recovery room in stable condition. There were no problems or complications. The patient tolerated the procedure well.     ____________________________ Denice Bors. Jacqlyn Larsen, MD bsc:bjt D: 04/02/2011 16:44:33 ET T: 04/02/2011 17:04:04 ET JOB#: [id number]  cc: Denice Bors. Jacqlyn Larsen, MD, <Dictator> Denice Bors Caitlyne Ingham MD ELECTRONICALLY SIGNED 04/05/2011 7:56

## 2014-05-08 NOTE — Op Note (Signed)
PATIENT NAME:  [name], [name] MR#:  [id number] DATE OF BIRTH:  [birth year]  DATE OF PROCEDURE:  03/19/2011  PREOPERATIVE DIAGNOSES: Urinary frequency, urgency, urge incontinence.   POSTOPERATIVE DIAGNOSIS: Urinary frequency, urgency, urge incontinence.   PROCEDURE: Percutaneous InterStim trial.   SURGEON: Edrick Oh, M.D.   ANESTHESIA: Local with sedation.   INDICATION: The patient is a 64 year old white female with a long history of urinary frequency, urgency, and urge component incontinence. She has been tried on multiple medical management with minimal success. She has had progressive worsening of her symptoms. She presents for InterStim trial.   DESCRIPTION OF PROCEDURE: After informed consent was obtained, the patient was taken to the Operating Room and placed in the prone position, on the operating table. She was prepped in the standard fashion. She was given initial IV sedation. The approximate midline was marked on the overlying skin of the sacrum. The approximate third sacral foramen was also marked on the overlying skin. An area was anesthetized utilizing 1% lidocaine. A spinal needle was then utilized to infiltrate additional lidocaine along the sacral foramen. The patient was noted to be very mobile during any attempts at local sedation. The decision was made therefore to proceed with slightly deeper IV sedation for better movement control. Once this was achieved, the first spinal needle was placed on the right. It was easily advanced into what was felt to be the third sacral foramen. This was confirmed on both anterior, posterior, and lateral positioning utilizing fluoroscopy. Stimulation of the needle demonstrated initial slight leg rotation with gradual pullback. Only bellows and toe movement was identified. Good response was appreciated. A needle was placed on the left, at the similar location. It was easily advanced into a sacral foramen. Stimulation demonstrated good bellows and  toe movement without leg rotation. The segment of stimulation was sizable. Once the needle was placed at its deepest point, the lead was placed through the needle. A slight pullback on the needle was performed. Stimulation of the lead demonstrated good response. The needle was removed. The lead was secured to the skin utilizing Steri-Strips and benzoin. The right lead was similarly placed. Initial stimulation after slight pullback of the needle demonstrated some rotation. Slight pullback demonstrated good response. The needle was then removed. The lead was left in place. It was secured to the skin utilizing benzoin and Steri-Strips. A Tegaderm was then applied over each site. The leads were applied to the skin. The leads were connected in standard fashion. Additional Tegaderms were placed over the lead and grounding pad sites. Additional tape was placed for security. The right lead was secured to the tape. The left lead was secured superficially. For access we decided to proceed with stimulation on the left initially. The patient was then returned to the supine position. She was then taken to the recovery room in stable condition. There were no problems or complications. The patient tolerated the procedure well. ____________________________ Denice Bors. Jacqlyn Larsen, MD bsc:slb D: 03/19/2011 12:57:08 ET T: 03/19/2011 13:45:37 ET JOB#: [id number]  cc: Denice Bors. Jacqlyn Larsen, MD, <Dictator> Denice Bors Tanaiya Kolarik MD ELECTRONICALLY SIGNED 03/26/2011 9:51

## 2014-05-09 ENCOUNTER — Encounter: Payer: Self-pay | Admitting: Neurology

## 2014-05-09 ENCOUNTER — Ambulatory Visit (INDEPENDENT_AMBULATORY_CARE_PROVIDER_SITE_OTHER): Payer: Medicare PPO | Admitting: Neurology

## 2014-05-09 VITALS — BP 158/90 | HR 89 | Resp 16 | Ht 62.0 in | Wt 212.0 lb

## 2014-05-09 DIAGNOSIS — M25569 Pain in unspecified knee: Secondary | ICD-10-CM | POA: Diagnosis not present

## 2014-05-09 DIAGNOSIS — R531 Weakness: Secondary | ICD-10-CM

## 2014-05-09 DIAGNOSIS — M6289 Other specified disorders of muscle: Secondary | ICD-10-CM

## 2014-05-09 NOTE — Patient Instructions (Signed)
1. Schedule ankle-brachial index 2. Start gabapentin '300mg'$ : Take 1 capsule at bedtime 3. Use cane at ALL times 4.Continue follow-up with your pain specialist for the leg pain 5. Follow-up in 4 months

## 2014-05-09 NOTE — Progress Notes (Signed)
NEUROLOGY FOLLOW UP OFFICE NOTE  [name] [id number]  HISTORY OF PRESENT ILLNESS: I had the pleasure of seeing [name] in follow-up in the neurology clinic on 05/09/2014.  The patient was last seen 65 months ago for tremors and falls. She has a history of an abnormal EEG, and after an episode of confusion, worsening left face and arm weakness, Keppra was restarted in 2014. She started having episodes of she loses feeling in her legs and would fall, no loss of consciousness but she feels she cannot speak. A 48-hour EEG done was normal, no epileptiform discharges/electrographic seizures, however typical events were not captured.   Since her last visit, she has stopped the [place]. She denies any recent falls. She feels these have been occurring when she gets upset, with a rush going over her. She reports that her biggest problem is the pain in her legs and feet. When she gets weaker on the left leg due to pain, she would tend to fall. She states "most of it is pain, I feel pain all the time." She has tried using heat, ice, creams, with no effect. Percocet does not help. She sees Pain Management. She had an EMG/NCV per patient, results unknown.   HPI: This is a 64 yo RH woman with a history of hypertension, chronic pain, RLS, who started having tremors in 2013 affecting both upper and lower extremities, usually triggered by lack of sleep. She reports she would shake, "like muscle spasms." She had a routine EEG which was reported as abnormal, with a 55 second period of rhythmic sharp waves and spike and wave discharges, in delta and theta ranges, in the left temporal region. Phase reversal noted at T3 electrode. She was started on Lamictal but did not tolerate higher doses and was discontinued in 2014. She had an episode of left face and arm weakness, confusion last August 2014, there is note of possible psychogenic event, however due to prior abnormal EEG, she was started on Keppra  '500mg'$  BID. Dose was increased due to patient report of zoning out, staring, and inability to speak. She self-reduced dose to '500mg'$  BID due to occipital headaches/tenderness.   She reports that episodes now start with a wave going through her body which would drain her. This would be followed by losing feeling in one or both legs causing her to fall. After around 5 minutes, she can stand. She denies any associated leg or back pain. Last episode was before Christmas. She has the same sensation if she sits on the commode too long. If she stands too long, her legs would start to hurt. She reports the tremors still continue to occur, affecting her hands. She feels she cannot speak but can hear people around her, denying any confusion. She reports that when she goes to the dentist, she has similar tremors. No significant change in episodes on Keppra. She reports feeling anxious all the time, and does tremble when scared.   Her half-brother has seizures. She had a concussion with brief loss of consciousness at age 2 or 5. Otherwise she had a normal birth and early development. There is no history of febrile convulsions, CNS infections such as meningitis/encephalitis, significant traumatic brain injury, neurosurgical procedures.  I personally reviewed head CT done 08/2012 which did not show any acute changes, there was patchy small vessel disease changes in the centrum semiovale bilaterally  PAST MEDICAL HISTORY: Past Medical History  Diagnosis Date  . Hypertension   . Cancer   .  Seizures   . Liver disease     MEDICATIONS: Current Outpatient Prescriptions on File Prior to Visit  Medication Sig Dispense Refill  . aspirin 81 MG tablet Take 81 mg by mouth daily.    . DULoxetine (CYMBALTA) 60 MG capsule Take 60 mg by mouth daily.    . hydrochlorothiazide (HYDRODIURIL) 25 MG tablet Take 25 mg by mouth daily.    Marland Kitchen oxyCODONE-acetaminophen (PERCOCET) 7.5-325 MG per tablet Take 1 tablet by mouth every 4  (four) hours as needed. For pain    . pantoprazole (PROTONIX) 40 MG tablet Take 40 mg by mouth daily.    Marland Kitchen rOPINIRole (REQUIP) 1 MG tablet Take 1 mg by mouth 2 (two) times daily.     Marland Kitchen levETIRAcetam (KEPPRA) 500 MG tablet Take 2 tablets (1,000 mg total) by mouth 2 (two) times daily. (Patient not taking: Reported on 05/09/2014) 120 tablet 11   No current facility-administered medications on file prior to visit.    ALLERGIES: Allergies  Allergen Reactions  . Codeine Other (See Comments)    Reaction unknown  . Sulfonamide Derivatives Other (See Comments)    Reaction unknown    FAMILY HISTORY: Family History  Problem Relation Age of Onset  . Hyperlipidemia Mother   . Hypertension Mother   . Hyperlipidemia Father   . Hypertension Father   . Alzheimer's disease Father   . Stroke Brother   . Cancer Daughter     unknown    SOCIAL HISTORY: History   Social History  . Marital Status: Married    Spouse Name: N/A  . Number of Children: N/A  . Years of Education: N/A   Occupational History  . Not on file.   Social History Main Topics  . Smoking status: Current Every Day Smoker  . Smokeless tobacco: Not on file     Comment: DOWN TO 1.5PACK DAY  . Alcohol Use: No  . Drug Use: No  . Sexual Activity: No   Other Topics Concern  . Not on file   Social History Narrative    REVIEW OF SYSTEMS: Constitutional: No fevers, chills, or sweats, no generalized fatigue, change in appetite Eyes: No visual changes, double vision, eye pain Ear, nose and throat: No hearing loss, ear pain, nasal congestion, sore throat Cardiovascular: No chest pain, palpitations Respiratory:  No shortness of breath at rest or with exertion, wheezes GastrointestinaI: No nausea, vomiting, diarrhea, abdominal pain, fecal incontinence Genitourinary:  No dysuria, urinary retention or frequency Musculoskeletal:  + neck pain, back pain; leg pain Integumentary: No rash, pruritus, skin lesions Neurological: as  above Psychiatric: No depression, insomnia, anxiety Endocrine: No palpitations, fatigue, diaphoresis, mood swings, change in appetite, change in weight, increased thirst Hematologic/Lymphatic:  No anemia, purpura, petechiae. Allergic/Immunologic: no itchy/runny eyes, nasal congestion, recent allergic reactions, rashes  PHYSICAL EXAM: Filed Vitals:   05/09/14 1151  BP: 158/90  Pulse: 89  Resp: 16   General: No acute distress Head:  Normocephalic/atraumatic Neck: supple, no paraspinal tenderness, full range of motion Heart:  Regular rate and rhythm Lungs:  Clear to auscultation bilaterally Back: No paraspinal tenderness Skin/Extremities: No rash, no edema Neurological Exam: alert and oriented to person, place, and time, no dysarthria or aphasia, Fund of knowledge is appropriate. Recent and remote memory are intact. Attention and concentration are normal. Able to name objects and repeat phrases. Cranial nerves: CN I: not tested CN II: pupils equal, round and reactive to light, visual fields intact, fundi unremarkable. CN III, IV, VI: full range of  motion, no nystagmus, no ptosis CN V: facial sensation intact CN VII: upper and lower face symmetric CN VIII: hearing intact to finger rub CN IX, X: gag intact, uvula midline CN XI: sternocleidomastoid and trapezius muscles intact CN XII: tongue midline Bulk & Tone: normal, no fasciculations. Motor: 4/5 on left UE. There is some giveway weakness however she again has orbiting around the left UE (indicating mild left UE weakness); 3/5 left hip flexion, 2+/5 left knee extension, 2-3/5 left foot dorsi and plantar flexion (similar to prior); 5/5 on right UE and LE Sensation: intact to light touch. No extinction to double simultaneous stimulation. Romberg test negative Deep Tendon Reflexes: +1 throughout except for absent ankle jerks bilaterally, no ankle clonus Plantar responses: downgoing bilaterally Cerebellar: no incoordination on  finger to nose testing. No dysdiadochokinesia Gait: drags left leg with poor foot clearance, similar to prior Tremor: negative  IMPRESSION: This is a 64 yo RH woman with hypertension, chronic pain, RLS, who started having tremors in 2013 affecting both upper and lower extremities. 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 She has been taking Keppra with no significant change in frequency of these spells. She has also been having episodes of leg numbness and weakness of unclear etiology, leading to falls. Her 48-hour EEG is normal, however typical events were not captured. By history, symptoms are unlikely to be seizures. She reports today that the falls happen when her leg pain worsens, and happen more when she is stressed. She is again noted to have left UE and LE weakness. I discussed doing brain and cervical imaging, which she refused today, stating weakness is old. She does not want to do PT. Her main concern is the pain, which Percocet does not help much with. She reports EMG done on lower extremities, results unavailable. Vascular imaging will be done to rule out arterial insufficiency as cause of pain. She will try low dose gabapentin '300mg'$  qhs. She will continue follow-up with her pain specialist and follow-up in 4 months. She was advised to use her cane at all times. She is aware of [place] driving laws to stop driving after an episode of loss of awareness, until 6 months event-free.   Thank you for allowing me to participate in her care.  Please do not hesitate to call for any questions or concerns.  The duration of this appointment visit was 25 minutes of face-to-face time with the patient.  Greater than 50% of this time was spent in counseling, explanation of diagnosis, planning of further management, and coordination of care.   [name], M.D.   CC: Dr. Hardin Negus

## 2014-05-11 ENCOUNTER — Ambulatory Visit (HOSPITAL_COMMUNITY): Payer: Medicare PPO | Attending: Cardiology | Admitting: *Deleted

## 2014-05-11 DIAGNOSIS — M25569 Pain in unspecified knee: Secondary | ICD-10-CM

## 2014-05-11 DIAGNOSIS — M79602 Pain in left arm: Secondary | ICD-10-CM

## 2014-05-11 NOTE — Progress Notes (Signed)
Lower Extremity Arterial Doppler - Complete - Bilateral ABI's > 1.0

## 2014-05-12 ENCOUNTER — Telehealth: Payer: Self-pay | Admitting: Family Medicine

## 2014-05-12 NOTE — Telephone Encounter (Signed)
Tried calling patient with no answer. Will try again.

## 2014-05-12 NOTE — Telephone Encounter (Signed)
-----   Message from Cameron Sprang, MD sent at 05/12/2014 10:29 AM EDT ----- Pls let her know blood flow test is normal, thanks

## 2014-05-16 MED ORDER — GABAPENTIN 300 MG PO CAPS: 300.0000 mg | ORAL_CAPSULE | Freq: Every day | ORAL | Status: DC

## 2014-05-16 NOTE — Telephone Encounter (Signed)
I called patient again and spoke with her. She was notified of her results. Patient also asked what was the advisement as far as for her feet swelling & discoloration, Dr. Delice Lesch wants her to discuss this with her pcp.

## 2014-06-21 ENCOUNTER — Ambulatory Visit: Payer: Medicare PPO | Admitting: Neurology

## 2014-07-20 DIAGNOSIS — R2232 Localized swelling, mass and lump, left upper limb: Secondary | ICD-10-CM | POA: Diagnosis not present

## 2014-07-20 DIAGNOSIS — R2231 Localized swelling, mass and lump, right upper limb: Secondary | ICD-10-CM | POA: Diagnosis not present

## 2014-08-01 DIAGNOSIS — M47812 Spondylosis without myelopathy or radiculopathy, cervical region: Secondary | ICD-10-CM | POA: Diagnosis not present

## 2014-08-01 DIAGNOSIS — Z79891 Long term (current) use of opiate analgesic: Secondary | ICD-10-CM | POA: Diagnosis not present

## 2014-08-01 DIAGNOSIS — M150 Primary generalized (osteo)arthritis: Secondary | ICD-10-CM | POA: Diagnosis not present

## 2014-08-01 DIAGNOSIS — G894 Chronic pain syndrome: Secondary | ICD-10-CM | POA: Diagnosis not present

## 2014-08-17 DIAGNOSIS — R2232 Localized swelling, mass and lump, left upper limb: Secondary | ICD-10-CM | POA: Diagnosis not present

## 2014-08-17 DIAGNOSIS — M25532 Pain in left wrist: Secondary | ICD-10-CM | POA: Diagnosis not present

## 2014-09-02 DIAGNOSIS — H04123 Dry eye syndrome of bilateral lacrimal glands: Secondary | ICD-10-CM | POA: Diagnosis not present

## 2014-09-02 DIAGNOSIS — H40053 Ocular hypertension, bilateral: Secondary | ICD-10-CM | POA: Diagnosis not present

## 2014-09-02 DIAGNOSIS — H2513 Age-related nuclear cataract, bilateral: Secondary | ICD-10-CM | POA: Diagnosis not present

## 2014-09-06 DIAGNOSIS — G894 Chronic pain syndrome: Secondary | ICD-10-CM | POA: Diagnosis not present

## 2014-09-06 DIAGNOSIS — M150 Primary generalized (osteo)arthritis: Secondary | ICD-10-CM | POA: Diagnosis not present

## 2014-09-06 DIAGNOSIS — Z79891 Long term (current) use of opiate analgesic: Secondary | ICD-10-CM | POA: Diagnosis not present

## 2014-09-06 DIAGNOSIS — M47812 Spondylosis without myelopathy or radiculopathy, cervical region: Secondary | ICD-10-CM | POA: Diagnosis not present

## 2014-09-08 ENCOUNTER — Ambulatory Visit: Payer: Medicare PPO | Admitting: Neurology

## 2014-09-12 DIAGNOSIS — H2513 Age-related nuclear cataract, bilateral: Secondary | ICD-10-CM | POA: Diagnosis not present

## 2014-09-12 DIAGNOSIS — H40023 Open angle with borderline findings, high risk, bilateral: Secondary | ICD-10-CM | POA: Diagnosis not present

## 2014-10-04 DIAGNOSIS — H04123 Dry eye syndrome of bilateral lacrimal glands: Secondary | ICD-10-CM | POA: Diagnosis not present

## 2014-10-04 DIAGNOSIS — H2513 Age-related nuclear cataract, bilateral: Secondary | ICD-10-CM | POA: Diagnosis not present

## 2014-10-07 DIAGNOSIS — M47812 Spondylosis without myelopathy or radiculopathy, cervical region: Secondary | ICD-10-CM | POA: Diagnosis not present

## 2014-10-07 DIAGNOSIS — G894 Chronic pain syndrome: Secondary | ICD-10-CM | POA: Diagnosis not present

## 2014-10-07 DIAGNOSIS — M150 Primary generalized (osteo)arthritis: Secondary | ICD-10-CM | POA: Diagnosis not present

## 2014-10-07 DIAGNOSIS — Z79891 Long term (current) use of opiate analgesic: Secondary | ICD-10-CM | POA: Diagnosis not present

## 2014-11-04 DIAGNOSIS — G894 Chronic pain syndrome: Secondary | ICD-10-CM | POA: Diagnosis not present

## 2014-11-04 DIAGNOSIS — M47812 Spondylosis without myelopathy or radiculopathy, cervical region: Secondary | ICD-10-CM | POA: Diagnosis not present

## 2014-11-04 DIAGNOSIS — Z79891 Long term (current) use of opiate analgesic: Secondary | ICD-10-CM | POA: Diagnosis not present

## 2014-11-04 DIAGNOSIS — M150 Primary generalized (osteo)arthritis: Secondary | ICD-10-CM | POA: Diagnosis not present

## 2014-11-10 ENCOUNTER — Encounter (HOSPITAL_COMMUNITY): Payer: Self-pay | Admitting: *Deleted

## 2014-11-10 ENCOUNTER — Emergency Department (HOSPITAL_COMMUNITY): Payer: Medicare Other

## 2014-11-10 ENCOUNTER — Emergency Department (HOSPITAL_COMMUNITY)
Admission: EM | Admit: 2014-11-10 | Discharge: 2014-11-10 | Disposition: A | Discharge status: Home / Self Care | Payer: Medicare Other | Attending: Emergency Medicine | Admitting: Emergency Medicine

## 2014-11-10 DIAGNOSIS — Z859 Personal history of malignant neoplasm, unspecified: Secondary | ICD-10-CM | POA: Diagnosis not present

## 2014-11-10 DIAGNOSIS — R079 Chest pain, unspecified: Secondary | ICD-10-CM | POA: Insufficient documentation

## 2014-11-10 DIAGNOSIS — Z7982 Long term (current) use of aspirin: Secondary | ICD-10-CM | POA: Diagnosis not present

## 2014-11-10 DIAGNOSIS — I10 Essential (primary) hypertension: Secondary | ICD-10-CM | POA: Diagnosis not present

## 2014-11-10 DIAGNOSIS — J209 Acute bronchitis, unspecified: Secondary | ICD-10-CM

## 2014-11-10 DIAGNOSIS — F17200 Nicotine dependence, unspecified, uncomplicated: Secondary | ICD-10-CM

## 2014-11-10 DIAGNOSIS — Z720 Tobacco use: Secondary | ICD-10-CM | POA: Insufficient documentation

## 2014-11-10 DIAGNOSIS — Z79899 Other long term (current) drug therapy: Secondary | ICD-10-CM | POA: Diagnosis not present

## 2014-11-10 DIAGNOSIS — G40909 Epilepsy, unspecified, not intractable, without status epilepticus: Secondary | ICD-10-CM | POA: Diagnosis not present

## 2014-11-10 DIAGNOSIS — Z8719 Personal history of other diseases of the digestive system: Secondary | ICD-10-CM | POA: Insufficient documentation

## 2014-11-10 DIAGNOSIS — R0602 Shortness of breath: Secondary | ICD-10-CM | POA: Diagnosis not present

## 2014-11-10 DIAGNOSIS — R509 Fever, unspecified: Secondary | ICD-10-CM | POA: Diagnosis not present

## 2014-11-10 LAB — CBC WITH DIFFERENTIAL/PLATELET
BASOS PCT: 0 %
Basophils Absolute: 0 10*3/uL (ref 0.0–0.1)
EOS PCT: 4 %
Eosinophils Absolute: 0.3 10*3/uL (ref 0.0–0.7)
HEMATOCRIT: 43.6 % (ref 36.0–46.0)
Hemoglobin: 14.3 g/dL (ref 12.0–15.0)
Lymphocytes Relative: 33 %
Lymphs Abs: 2.3 10*3/uL (ref 0.7–4.0)
MCH: 28.8 pg (ref 26.0–34.0)
MCHC: 32.8 g/dL (ref 30.0–36.0)
MCV: 87.9 fL (ref 78.0–100.0)
Monocytes Absolute: 0.5 10*3/uL (ref 0.1–1.0)
Monocytes Relative: 7 %
NEUTROS ABS: 3.9 10*3/uL (ref 1.7–7.7)
Neutrophils Relative %: 56 %
PLATELETS: 211 10*3/uL (ref 150–400)
RBC: 4.96 MIL/uL (ref 3.87–5.11)
RDW: 14.6 % (ref 11.5–15.5)
WBC: 6.9 10*3/uL (ref 4.0–10.5)

## 2014-11-10 LAB — I-STAT TROPONIN, ED: TROPONIN I, POC: 0 ng/mL (ref 0.00–0.08)

## 2014-11-10 LAB — BASIC METABOLIC PANEL
ANION GAP: 8 (ref 5–15)
BUN: 7 mg/dL (ref 6–20)
CO2: 29 mmol/L (ref 22–32)
Calcium: 9.3 mg/dL (ref 8.9–10.3)
Chloride: 101 mmol/L (ref 101–111)
Creatinine, Ser: 0.63 mg/dL (ref 0.44–1.00)
GFR calc Af Amer: >60 mL/min (ref >60)
GLUCOSE: 106 mg/dL — AB (ref 65–99)
Potassium: 4 mmol/L (ref 3.5–5.1)
SODIUM: 138 mmol/L (ref 135–145)

## 2014-11-10 LAB — I-STAT CG4 LACTIC ACID, ED: LACTIC ACID, VENOUS: 1.75 mmol/L (ref 0.5–2.0)

## 2014-11-10 LAB — BRAIN NATRIURETIC PEPTIDE: B Natriuretic Peptide: 31.1 pg/mL (ref 0.0–100.0)

## 2014-11-10 MED ORDER — AZITHROMYCIN 250 MG PO TABS
500.0000 mg | ORAL_TABLET | Freq: Once | ORAL | Status: AC
  Administered 2014-11-10: 500 mg via ORAL
  Filled 2014-11-10: qty 2

## 2014-11-10 MED ORDER — PREDNISONE 20 MG PO TABS
60.0000 mg | ORAL_TABLET | Freq: Once | ORAL | Status: AC
  Administered 2014-11-10: 60 mg via ORAL
  Filled 2014-11-10: qty 3

## 2014-11-10 MED ORDER — PREDNISONE 20 MG PO TABS: 60.0000 mg | ORAL_TABLET | Freq: Once | ORAL | Status: DC

## 2014-11-10 MED ORDER — BENZONATATE 100 MG PO CAPS: 100.0000 mg | ORAL_CAPSULE | Freq: Three times a day (TID) | ORAL | Status: DC

## 2014-11-10 MED ORDER — ALBUTEROL SULFATE HFA 108 (90 BASE) MCG/ACT IN AERS: 2.0000 | INHALATION_SPRAY | RESPIRATORY_TRACT | Status: DC | PRN

## 2014-11-10 MED ORDER — PREDNISONE 50 MG PO TABS: ORAL_TABLET | ORAL | Status: DC

## 2014-11-10 MED ORDER — AZITHROMYCIN 250 MG PO TABS: ORAL_TABLET | ORAL | Status: DC

## 2014-11-10 MED ORDER — IPRATROPIUM-ALBUTEROL 0.5-2.5 (3) MG/3ML IN SOLN
3.0000 mL | Freq: Once | RESPIRATORY_TRACT | Status: AC
  Administered 2014-11-10: 3 mL via RESPIRATORY_TRACT
  Filled 2014-11-10: qty 3

## 2014-11-10 MED ORDER — ACETAMINOPHEN 325 MG PO TABS
975.0000 mg | ORAL_TABLET | Freq: Once | ORAL | Status: AC
  Administered 2014-11-10: 975 mg via ORAL
  Filled 2014-11-10: qty 3

## 2014-11-10 NOTE — ED Notes (Signed)
PT states she has been sick for 6 weeks with coughing and coughing up multicolored sputum.  Now she hurts all over and reports fever.

## 2014-11-10 NOTE — ED Notes (Signed)
Reviewed discharge instructions, prescription medications, and follow-up information with patient. Patient verbalized understanding.

## 2014-11-10 NOTE — ED Provider Notes (Signed)
CSN: [id number]     Arrival date & time 11/10/14  1153 History   First MD Initiated Contact with Patient 11/10/14 1237     Chief Complaint  Patient presents with  . Shortness of Breath  . Cough     (Consider location/radiation/quality/duration/timing/severity/associated sxs/prior Treatment) HPI   Blood pressure 130/81, pulse 92, temperature 97.3 F (36.3 C), temperature source Oral, resp. rate 20, weight 207 lb 5 oz (94.036 kg), SpO2 100 %.  [name] is a 64 y.o. female complaining of productive cough, shortness of breath and pleuritic chest pain worsening over the course of 6 weeks. Patient reports tactile fever and chills. No nausea or vomiting. Patient is an active daily smoker, no formal history of COPD. Patient denies focal chest pain, abdominal pain, nausea, vomiting, change in bowel or bladder habits, peripheral edema, orthopnea, PND.  Past Medical History  Diagnosis Date  . Hypertension   . Cancer ([place])   . Seizures ([place])   . Liver disease    Past Surgical History  Procedure Laterality Date  . Back surgery    . Neck surgery    . Toe surgery    . Hand surgery    . Cholecystectomy    . Knee surgery    . Total hip arthroplasty Left   . Cesarean section    . Hemorrhoid surgery    . Vaginal hysterectomy    . Tubal ligation     Family History  Problem Relation Age of Onset  . Hyperlipidemia Mother   . Hypertension Mother   . Hyperlipidemia Father   . Hypertension Father   . Alzheimer's disease Father   . Stroke Brother   . Cancer Daughter     unknown   Social History  Substance Use Topics  . Smoking status: Current Every Day Smoker  . Smokeless tobacco: None     Comment: DOWN TO 1.5PACK DAY  . Alcohol Use: No   OB History    No data available     Review of Systems  10 systems reviewed and found to be negative, except as noted in the HPI.   Allergies  Codeine and Sulfonamide derivatives  Home Medications   Prior to Admission  medications   Medication Sig Start Date End Date Taking? Authorizing Provider  aspirin 81 MG tablet Take 81 mg by mouth daily.    Historical Provider, MD  DULoxetine (CYMBALTA) 60 MG capsule Take 60 mg by mouth daily.    Historical Provider, MD  gabapentin (NEURONTIN) 300 MG capsule Take 1 capsule (300 mg total) by mouth at bedtime. 05/16/14   Cameron Sprang, MD  hydrochlorothiazide (HYDRODIURIL) 25 MG tablet Take 25 mg by mouth daily.    Historical Provider, MD  levETIRAcetam (KEPPRA) 500 MG tablet Take 2 tablets (1,000 mg total) by mouth 2 (two) times daily. Patient not taking: Reported on 05/09/2014 11/08/13   Penni Bombard, MD  oxyCODONE-acetaminophen (PERCOCET) 7.5-325 MG per tablet Take 1 tablet by mouth every 4 (four) hours as needed. For pain    Historical Provider, MD  pantoprazole (PROTONIX) 40 MG tablet Take 40 mg by mouth daily.    Historical Provider, MD  rOPINIRole (REQUIP) 1 MG tablet Take 1 mg by mouth 2 (two) times daily.     Historical Provider, MD   BP 130/81 mmHg  Pulse 92  Temp(Src) 97.3 F (36.3 C) (Oral)  Resp 20  Wt 207 lb 5 oz (94.036 kg)  SpO2 100% Physical Exam  Constitutional: She  is oriented to person, place, and time. She appears well-developed and well-nourished. No distress.  HENT:  Head: Normocephalic.  Mouth/Throat: Oropharynx is clear and moist.  Eyes: Conjunctivae and EOM are normal. Pupils are equal, round, and reactive to light.  Neck: Normal range of motion. No JVD present. No tracheal deviation present.  Cardiovascular: Normal rate, regular rhythm and intact distal pulses.   Radial pulse equal bilaterally  Pulmonary/Chest: Effort normal and breath sounds normal. No stridor. No respiratory distress. She has no wheezes. She has no rales. She exhibits no tenderness.  Abdominal: Soft. Bowel sounds are normal. She exhibits no distension and no mass. There is no tenderness. There is no rebound and no guarding.  Musculoskeletal: Normal range of motion.  She exhibits no edema or tenderness.  No calf asymmetry, superficial collaterals, palpable cords, edema, Homans sign negative bilaterally.    Neurological: She is alert and oriented to person, place, and time.  Skin: Skin is warm. She is not diaphoretic.  Psychiatric: She has a normal mood and affect.  Nursing note and vitals reviewed.   ED Course  Procedures (including critical care time) Labs Review Labs Reviewed  CBC WITH DIFFERENTIAL/PLATELET  BASIC METABOLIC PANEL  BRAIN NATRIURETIC PEPTIDE  I-STAT [place], ED  I-STAT CG4 LACTIC ACID, ED    Imaging Review Dg Chest 2 View  11/10/2014  CLINICAL DATA:  Intermittent fever and chest pain for 3 weeks EXAM: CHEST  2 VIEW COMPARISON:  Chest radiograph July 31, 2011 and chest CT July 31, 2011 FINDINGS: The lungs are clear. The heart size and pulmonary vascularity are normal. No pneumothorax. No adenopathy. There is postoperative change in the lower cervical spine region. There are surgical clips in the right upper quadrant region. IMPRESSION: No edema or consolidation. Electronically Signed   By: Lowella Grip III M.D.   On: 11/10/2014 12:35   I have personally reviewed and evaluated these images and lab results as part of my medical decision-making.   EKG Interpretation None      MDM   Final diagnoses:  Acute bronchitis, unspecified organism  Tobacco use disorder    Filed Vitals:   11/10/14 1159 11/10/14 1245 11/10/14 1300 11/10/14 1315  BP: 130/81 119/66  138/62  Pulse: 92 80 73 77  Temp: 97.3 F (36.3 C)     TempSrc: Oral     Resp: '20  21 23  '$ Weight: 207 lb 5 oz (94.036 kg)     SpO2: 100% 100% 98% 98%    Medications  azithromycin (ZITHROMAX) tablet 500 mg (not administered)  predniSONE (DELTASONE) tablet 60 mg (not administered)  predniSONE (DELTASONE) tablet 60 mg (60 mg Oral Given 11/10/14 1317)  ipratropium-albuterol (DUONEB) 0.5-2.5 (3) MG/3ML nebulizer solution 3 mL (3 mLs Nebulization Given 11/10/14  1317)    [name] is 64 y.o. female presenting with reactive cough and shortness of breath. Patient is saturating well on room air, no respiratory distress. EKG with no acute changes, troponin and pro BNP are negative, blood work otherwise unremarkable. Patient is an active daily smoker, her chest x-rays without acute abnormality, we'll treat her for acute bronchitis. Counseled patient on smoking cessation for approximately 10 minutes. Trial of ambulation shows that this patient can maintain her sats above 93% consistently.  This is a shared visit with the attending physician who personally evaluated the patient and agrees with the care plan.    Evaluation does not show pathology that would require ongoing emergent intervention or inpatient treatment. Pt is hemodynamically  stable and mentating appropriately. Discussed findings and plan with patient/guardian, who agrees with care plan. All questions answered. Return precautions discussed and outpatient follow up given.   New Prescriptions   ALBUTEROL (PROVENTIL HFA;VENTOLIN HFA) 108 (90 BASE) MCG/ACT INHALER    Inhale 2 puffs into the lungs every 4 (four) hours as needed for wheezing or shortness of breath (cough).   AZITHROMYCIN (ZITHROMAX Z-PAK) 250 MG TABLET    2 po day one, then 1 daily x 4 days   BENZONATATE (TESSALON) 100 MG CAPSULE    Take 1 capsule (100 mg total) by mouth every 8 (eight) hours.   PREDNISONE (DELTASONE) 50 MG TABLET    Take 1 tablet daily with breakfast        Monico Blitz, PA-C 11/10/14 [place], MD 11/14/14 [phone]

## 2014-11-10 NOTE — ED Provider Notes (Signed)
Complains of productive cough for 2 weeks, he by shortness of breath and diffuse myalgias. No fever. No other associated symptoms on exam speaks in paragraphs on exam alert nontoxic-appearing no respiratory distress lungs with scant diffuse rhonchi. I I counseled patient for 5 minutes on smoking cessation  Orlie Dakin, MD 11/10/14 1357

## 2014-11-10 NOTE — Discharge Instructions (Signed)
Please follow with your primary care doctor in the next 2 days for a check-up. They must obtain records for further management.   Do not hesitate to return to the Emergency Department for any new, worsening or concerning symptoms.    Acute Bronchitis Bronchitis is inflammation of the airways that extend from the windpipe into the lungs (bronchi). The inflammation often causes mucus to develop. This leads to a cough, which is the most common symptom of bronchitis.  In acute bronchitis, the condition usually develops suddenly and goes away over time, usually in a couple weeks. Smoking, allergies, and asthma can make bronchitis worse. Repeated episodes of bronchitis may cause further lung problems.  CAUSES Acute bronchitis is most often caused by the same virus that causes a cold. The virus can spread from person to person (contagious) through coughing, sneezing, and touching contaminated objects. SIGNS AND SYMPTOMS   Cough.   Fever.   Coughing up mucus.   Body aches.   Chest congestion.   Chills.   Shortness of breath.   Sore throat.  DIAGNOSIS  Acute bronchitis is usually diagnosed through a physical exam. Your health care provider will also ask you questions about your medical history. Tests, such as chest X-rays, are sometimes done to rule out other conditions.  TREATMENT  Acute bronchitis usually goes away in a couple weeks. Oftentimes, no medical treatment is necessary. Medicines are sometimes given for relief of fever or cough. Antibiotic medicines are usually not needed but may be prescribed in certain situations. In some cases, an inhaler may be recommended to help reduce shortness of breath and control the cough. A cool mist vaporizer may also be used to help thin bronchial secretions and make it easier to clear the chest.  HOME CARE INSTRUCTIONS  Get plenty of rest.   Drink enough fluids to keep your urine clear or pale yellow (unless you have a medical condition  that requires fluid restriction). Increasing fluids may help thin your respiratory secretions (sputum) and reduce chest congestion, and it will prevent dehydration.   Take medicines only as directed by your health care provider.  If you were prescribed an antibiotic medicine, finish it all even if you start to feel better.  Avoid smoking and secondhand smoke. Exposure to cigarette smoke or irritating chemicals will make bronchitis worse. If you are a smoker, consider using nicotine gum or skin patches to help control withdrawal symptoms. Quitting smoking will help your lungs heal faster.   Reduce the chances of another bout of acute bronchitis by washing your hands frequently, avoiding people with cold symptoms, and trying not to touch your hands to your mouth, nose, or eyes.   Keep all follow-up visits as directed by your health care provider.  SEEK MEDICAL CARE IF: Your symptoms do not improve after 1 week of treatment.  SEEK IMMEDIATE MEDICAL CARE IF:  You develop an increased fever or chills.   You have chest pain.   You have severe shortness of breath.  You have bloody sputum.   You develop dehydration.  You faint or repeatedly feel like you are going to pass out.  You develop repeated vomiting.  You develop a severe headache. MAKE SURE YOU:   Understand these instructions.  Will watch your condition.  Will get help right away if you are not doing well or get worse.   This information is not intended to replace advice given to you by your health care provider. Make sure you discuss any questions you  have with your health care provider.   Document Released: 02/08/2004 Document Revised: 01/21/2014 Document Reviewed: 06/23/2012 Elsevier Interactive Patient Education [employer].

## 2014-11-10 NOTE — ED Notes (Signed)
Ambulated patient through hall - patient maintained SpO2 96-99% while walking, steady gait.

## 2014-12-06 DIAGNOSIS — M150 Primary generalized (osteo)arthritis: Secondary | ICD-10-CM | POA: Diagnosis not present

## 2014-12-06 DIAGNOSIS — G894 Chronic pain syndrome: Secondary | ICD-10-CM | POA: Diagnosis not present

## 2014-12-06 DIAGNOSIS — Z79891 Long term (current) use of opiate analgesic: Secondary | ICD-10-CM | POA: Diagnosis not present

## 2014-12-06 DIAGNOSIS — M47812 Spondylosis without myelopathy or radiculopathy, cervical region: Secondary | ICD-10-CM | POA: Diagnosis not present

## 2015-01-04 DIAGNOSIS — G894 Chronic pain syndrome: Secondary | ICD-10-CM | POA: Diagnosis not present

## 2015-01-04 DIAGNOSIS — Z79891 Long term (current) use of opiate analgesic: Secondary | ICD-10-CM | POA: Diagnosis not present

## 2015-01-04 DIAGNOSIS — M150 Primary generalized (osteo)arthritis: Secondary | ICD-10-CM | POA: Diagnosis not present

## 2015-01-04 DIAGNOSIS — M47812 Spondylosis without myelopathy or radiculopathy, cervical region: Secondary | ICD-10-CM | POA: Diagnosis not present

## 2015-02-03 DIAGNOSIS — M47812 Spondylosis without myelopathy or radiculopathy, cervical region: Secondary | ICD-10-CM | POA: Diagnosis not present

## 2015-02-03 DIAGNOSIS — Z79891 Long term (current) use of opiate analgesic: Secondary | ICD-10-CM | POA: Diagnosis not present

## 2015-02-03 DIAGNOSIS — G894 Chronic pain syndrome: Secondary | ICD-10-CM | POA: Diagnosis not present

## 2015-02-03 DIAGNOSIS — M150 Primary generalized (osteo)arthritis: Secondary | ICD-10-CM | POA: Diagnosis not present

## 2015-03-09 DIAGNOSIS — Z79891 Long term (current) use of opiate analgesic: Secondary | ICD-10-CM | POA: Diagnosis not present

## 2015-03-09 DIAGNOSIS — M47812 Spondylosis without myelopathy or radiculopathy, cervical region: Secondary | ICD-10-CM | POA: Diagnosis not present

## 2015-03-09 DIAGNOSIS — G894 Chronic pain syndrome: Secondary | ICD-10-CM | POA: Diagnosis not present

## 2015-03-09 DIAGNOSIS — M150 Primary generalized (osteo)arthritis: Secondary | ICD-10-CM | POA: Diagnosis not present

## 2015-04-06 DIAGNOSIS — G894 Chronic pain syndrome: Secondary | ICD-10-CM | POA: Diagnosis not present

## 2015-04-06 DIAGNOSIS — M47812 Spondylosis without myelopathy or radiculopathy, cervical region: Secondary | ICD-10-CM | POA: Diagnosis not present

## 2015-04-06 DIAGNOSIS — M150 Primary generalized (osteo)arthritis: Secondary | ICD-10-CM | POA: Diagnosis not present

## 2015-04-06 DIAGNOSIS — Z79891 Long term (current) use of opiate analgesic: Secondary | ICD-10-CM | POA: Diagnosis not present

## 2015-04-11 DIAGNOSIS — R040 Epistaxis: Secondary | ICD-10-CM | POA: Diagnosis not present

## 2015-04-11 DIAGNOSIS — I10 Essential (primary) hypertension: Secondary | ICD-10-CM | POA: Diagnosis not present

## 2015-04-11 DIAGNOSIS — R042 Hemoptysis: Secondary | ICD-10-CM | POA: Diagnosis not present

## 2015-04-11 DIAGNOSIS — R51 Headache: Secondary | ICD-10-CM | POA: Diagnosis not present

## 2015-04-17 DIAGNOSIS — I639 Cerebral infarction, unspecified: Secondary | ICD-10-CM | POA: Diagnosis not present

## 2015-04-17 DIAGNOSIS — R5383 Other fatigue: Secondary | ICD-10-CM | POA: Diagnosis not present

## 2015-04-17 DIAGNOSIS — R040 Epistaxis: Secondary | ICD-10-CM | POA: Diagnosis not present

## 2015-04-17 DIAGNOSIS — I10 Essential (primary) hypertension: Secondary | ICD-10-CM | POA: Diagnosis not present

## 2015-04-17 DIAGNOSIS — R51 Headache: Secondary | ICD-10-CM | POA: Diagnosis not present

## 2015-04-17 DIAGNOSIS — Z91038 Other insect allergy status: Secondary | ICD-10-CM | POA: Diagnosis not present

## 2015-04-17 DIAGNOSIS — I2510 Atherosclerotic heart disease of native coronary artery without angina pectoris: Secondary | ICD-10-CM | POA: Diagnosis not present

## 2015-04-17 DIAGNOSIS — E559 Vitamin D deficiency, unspecified: Secondary | ICD-10-CM | POA: Diagnosis not present

## 2015-04-17 DIAGNOSIS — R042 Hemoptysis: Secondary | ICD-10-CM | POA: Diagnosis not present

## 2015-05-02 DIAGNOSIS — H2513 Age-related nuclear cataract, bilateral: Secondary | ICD-10-CM | POA: Diagnosis not present

## 2015-05-02 DIAGNOSIS — H04123 Dry eye syndrome of bilateral lacrimal glands: Secondary | ICD-10-CM | POA: Diagnosis not present

## 2015-05-05 ENCOUNTER — Other Ambulatory Visit: Payer: Self-pay | Admitting: Nurse Practitioner

## 2015-05-05 ENCOUNTER — Ambulatory Visit
Admission: RE | Admit: 2015-05-05 | Discharge: 2015-05-05 | Disposition: A | Discharge status: Home / Self Care | Payer: Medicare Other | Source: Ambulatory Visit | Attending: Nurse Practitioner | Admitting: Nurse Practitioner

## 2015-05-05 DIAGNOSIS — M150 Primary generalized (osteo)arthritis: Secondary | ICD-10-CM | POA: Diagnosis not present

## 2015-05-05 DIAGNOSIS — R042 Hemoptysis: Secondary | ICD-10-CM

## 2015-05-05 DIAGNOSIS — G894 Chronic pain syndrome: Secondary | ICD-10-CM | POA: Diagnosis not present

## 2015-05-05 DIAGNOSIS — M47812 Spondylosis without myelopathy or radiculopathy, cervical region: Secondary | ICD-10-CM | POA: Diagnosis not present

## 2015-05-05 DIAGNOSIS — Z79891 Long term (current) use of opiate analgesic: Secondary | ICD-10-CM | POA: Diagnosis not present

## 2015-06-06 DIAGNOSIS — Z79891 Long term (current) use of opiate analgesic: Secondary | ICD-10-CM | POA: Diagnosis not present

## 2015-06-06 DIAGNOSIS — M150 Primary generalized (osteo)arthritis: Secondary | ICD-10-CM | POA: Diagnosis not present

## 2015-06-06 DIAGNOSIS — M47812 Spondylosis without myelopathy or radiculopathy, cervical region: Secondary | ICD-10-CM | POA: Diagnosis not present

## 2015-06-06 DIAGNOSIS — G894 Chronic pain syndrome: Secondary | ICD-10-CM | POA: Diagnosis not present

## 2015-07-07 DIAGNOSIS — M150 Primary generalized (osteo)arthritis: Secondary | ICD-10-CM | POA: Diagnosis not present

## 2015-07-07 DIAGNOSIS — G894 Chronic pain syndrome: Secondary | ICD-10-CM | POA: Diagnosis not present

## 2015-07-07 DIAGNOSIS — M47812 Spondylosis without myelopathy or radiculopathy, cervical region: Secondary | ICD-10-CM | POA: Diagnosis not present

## 2015-07-07 DIAGNOSIS — Z79891 Long term (current) use of opiate analgesic: Secondary | ICD-10-CM | POA: Diagnosis not present

## 2015-08-02 DIAGNOSIS — M25561 Pain in right knee: Secondary | ICD-10-CM | POA: Diagnosis not present

## 2015-08-04 DIAGNOSIS — Z79891 Long term (current) use of opiate analgesic: Secondary | ICD-10-CM | POA: Diagnosis not present

## 2015-08-04 DIAGNOSIS — M47812 Spondylosis without myelopathy or radiculopathy, cervical region: Secondary | ICD-10-CM | POA: Diagnosis not present

## 2015-08-04 DIAGNOSIS — G894 Chronic pain syndrome: Secondary | ICD-10-CM | POA: Diagnosis not present

## 2015-08-04 DIAGNOSIS — M150 Primary generalized (osteo)arthritis: Secondary | ICD-10-CM | POA: Diagnosis not present

## 2015-08-09 DIAGNOSIS — M1711 Unilateral primary osteoarthritis, right knee: Secondary | ICD-10-CM | POA: Diagnosis not present

## 2015-09-07 DIAGNOSIS — Z79891 Long term (current) use of opiate analgesic: Secondary | ICD-10-CM | POA: Diagnosis not present

## 2015-09-07 DIAGNOSIS — M47812 Spondylosis without myelopathy or radiculopathy, cervical region: Secondary | ICD-10-CM | POA: Diagnosis not present

## 2015-09-07 DIAGNOSIS — M150 Primary generalized (osteo)arthritis: Secondary | ICD-10-CM | POA: Diagnosis not present

## 2015-09-07 DIAGNOSIS — G894 Chronic pain syndrome: Secondary | ICD-10-CM | POA: Diagnosis not present

## 2015-09-21 DIAGNOSIS — M25531 Pain in right wrist: Secondary | ICD-10-CM | POA: Diagnosis not present

## 2015-09-21 DIAGNOSIS — M79641 Pain in right hand: Secondary | ICD-10-CM | POA: Diagnosis not present

## 2015-09-23 DIAGNOSIS — M79641 Pain in right hand: Secondary | ICD-10-CM | POA: Diagnosis not present

## 2015-10-05 DIAGNOSIS — M150 Primary generalized (osteo)arthritis: Secondary | ICD-10-CM | POA: Diagnosis not present

## 2015-10-05 DIAGNOSIS — Z79891 Long term (current) use of opiate analgesic: Secondary | ICD-10-CM | POA: Diagnosis not present

## 2015-10-05 DIAGNOSIS — G894 Chronic pain syndrome: Secondary | ICD-10-CM | POA: Diagnosis not present

## 2015-10-05 DIAGNOSIS — M47812 Spondylosis without myelopathy or radiculopathy, cervical region: Secondary | ICD-10-CM | POA: Diagnosis not present

## 2015-10-09 DIAGNOSIS — G5621 Lesion of ulnar nerve, right upper limb: Secondary | ICD-10-CM | POA: Diagnosis not present

## 2015-10-09 DIAGNOSIS — M25531 Pain in right wrist: Secondary | ICD-10-CM | POA: Diagnosis not present

## 2015-10-16 DIAGNOSIS — I10 Essential (primary) hypertension: Secondary | ICD-10-CM | POA: Diagnosis not present

## 2015-10-16 DIAGNOSIS — E559 Vitamin D deficiency, unspecified: Secondary | ICD-10-CM | POA: Diagnosis not present

## 2015-10-16 DIAGNOSIS — I639 Cerebral infarction, unspecified: Secondary | ICD-10-CM | POA: Diagnosis not present

## 2015-10-16 DIAGNOSIS — R5383 Other fatigue: Secondary | ICD-10-CM | POA: Diagnosis not present

## 2015-10-17 DIAGNOSIS — M1711 Unilateral primary osteoarthritis, right knee: Secondary | ICD-10-CM | POA: Diagnosis not present

## 2015-10-23 ENCOUNTER — Other Ambulatory Visit: Payer: Self-pay | Admitting: Nurse Practitioner

## 2015-10-23 ENCOUNTER — Ambulatory Visit
Admission: RE | Admit: 2015-10-23 | Discharge: 2015-10-23 | Disposition: A | Discharge status: Home / Self Care | Payer: Medicare Other | Source: Ambulatory Visit | Attending: Nurse Practitioner | Admitting: Nurse Practitioner

## 2015-10-23 ENCOUNTER — Other Ambulatory Visit: Payer: Self-pay | Admitting: Family Medicine

## 2015-10-23 DIAGNOSIS — Z01818 Encounter for other preprocedural examination: Secondary | ICD-10-CM

## 2015-10-31 DIAGNOSIS — Z01810 Encounter for preprocedural cardiovascular examination: Secondary | ICD-10-CM | POA: Diagnosis not present

## 2015-10-31 DIAGNOSIS — R0609 Other forms of dyspnea: Secondary | ICD-10-CM | POA: Diagnosis not present

## 2015-10-31 DIAGNOSIS — I10 Essential (primary) hypertension: Secondary | ICD-10-CM | POA: Diagnosis not present

## 2015-10-31 DIAGNOSIS — E781 Pure hyperglyceridemia: Secondary | ICD-10-CM | POA: Diagnosis not present

## 2015-11-03 DIAGNOSIS — Z79891 Long term (current) use of opiate analgesic: Secondary | ICD-10-CM | POA: Diagnosis not present

## 2015-11-03 DIAGNOSIS — G894 Chronic pain syndrome: Secondary | ICD-10-CM | POA: Diagnosis not present

## 2015-11-03 DIAGNOSIS — M47812 Spondylosis without myelopathy or radiculopathy, cervical region: Secondary | ICD-10-CM | POA: Diagnosis not present

## 2015-11-03 DIAGNOSIS — M150 Primary generalized (osteo)arthritis: Secondary | ICD-10-CM | POA: Diagnosis not present

## 2015-11-06 DIAGNOSIS — Z01810 Encounter for preprocedural cardiovascular examination: Secondary | ICD-10-CM | POA: Diagnosis not present

## 2015-11-06 DIAGNOSIS — R9431 Abnormal electrocardiogram [ECG] [EKG]: Secondary | ICD-10-CM | POA: Diagnosis not present

## 2015-11-06 DIAGNOSIS — R0602 Shortness of breath: Secondary | ICD-10-CM | POA: Diagnosis not present

## 2015-11-15 DIAGNOSIS — Z136 Encounter for screening for cardiovascular disorders: Secondary | ICD-10-CM | POA: Diagnosis not present

## 2015-11-22 DIAGNOSIS — R9431 Abnormal electrocardiogram [ECG] [EKG]: Secondary | ICD-10-CM | POA: Diagnosis not present

## 2015-11-22 DIAGNOSIS — R0602 Shortness of breath: Secondary | ICD-10-CM | POA: Diagnosis not present

## 2015-11-22 DIAGNOSIS — Z01810 Encounter for preprocedural cardiovascular examination: Secondary | ICD-10-CM | POA: Diagnosis not present

## 2015-11-27 DIAGNOSIS — M1711 Unilateral primary osteoarthritis, right knee: Secondary | ICD-10-CM | POA: Diagnosis not present

## 2015-11-28 DIAGNOSIS — I10 Essential (primary) hypertension: Secondary | ICD-10-CM | POA: Diagnosis not present

## 2015-11-28 DIAGNOSIS — Z01810 Encounter for preprocedural cardiovascular examination: Secondary | ICD-10-CM | POA: Diagnosis not present

## 2015-11-28 DIAGNOSIS — E781 Pure hyperglyceridemia: Secondary | ICD-10-CM | POA: Diagnosis not present

## 2015-11-28 DIAGNOSIS — R0609 Other forms of dyspnea: Secondary | ICD-10-CM | POA: Diagnosis not present

## 2015-12-01 DIAGNOSIS — M150 Primary generalized (osteo)arthritis: Secondary | ICD-10-CM | POA: Diagnosis not present

## 2015-12-01 DIAGNOSIS — M47812 Spondylosis without myelopathy or radiculopathy, cervical region: Secondary | ICD-10-CM | POA: Diagnosis not present

## 2015-12-01 DIAGNOSIS — G894 Chronic pain syndrome: Secondary | ICD-10-CM | POA: Diagnosis not present

## 2015-12-01 DIAGNOSIS — Z79891 Long term (current) use of opiate analgesic: Secondary | ICD-10-CM | POA: Diagnosis not present

## 2015-12-14 DIAGNOSIS — I739 Peripheral vascular disease, unspecified: Secondary | ICD-10-CM | POA: Diagnosis not present

## 2015-12-14 DIAGNOSIS — Z136 Encounter for screening for cardiovascular disorders: Secondary | ICD-10-CM | POA: Diagnosis not present

## 2015-12-14 DIAGNOSIS — I10 Essential (primary) hypertension: Secondary | ICD-10-CM | POA: Diagnosis not present

## 2015-12-14 DIAGNOSIS — Z01810 Encounter for preprocedural cardiovascular examination: Secondary | ICD-10-CM | POA: Diagnosis not present

## 2015-12-15 DIAGNOSIS — M7710 Lateral epicondylitis, unspecified elbow: Secondary | ICD-10-CM

## 2015-12-15 HISTORY — DX: Lateral epicondylitis, unspecified elbow: M77.10

## 2015-12-15 HISTORY — PX: TENNIS ELBOW RELEASE/NIRSCHEL PROCEDURE: SHX6651

## 2015-12-19 DIAGNOSIS — G5621 Lesion of ulnar nerve, right upper limb: Secondary | ICD-10-CM | POA: Diagnosis not present

## 2015-12-26 DIAGNOSIS — R0609 Other forms of dyspnea: Secondary | ICD-10-CM | POA: Diagnosis not present

## 2015-12-26 DIAGNOSIS — I10 Essential (primary) hypertension: Secondary | ICD-10-CM | POA: Diagnosis not present

## 2015-12-26 DIAGNOSIS — Z01810 Encounter for preprocedural cardiovascular examination: Secondary | ICD-10-CM | POA: Diagnosis not present

## 2015-12-26 DIAGNOSIS — E781 Pure hyperglyceridemia: Secondary | ICD-10-CM | POA: Diagnosis not present

## 2015-12-28 NOTE — H&P (Signed)
[name] is an 65 y.o. female.    Chief Complaint: right knee pain  HPI: Pt is a 65 y.o. female complaining of right knee pain for multiple years. Pain had continually increased since the beginning. X-rays in the clinic show end-stage arthritic changes of the right knee. Pt has tried various conservative treatments which have failed to alleviate their symptoms, including injections and therapy. Various options are discussed with the patient. Risks, benefits and expectations were discussed with the patient. Patient understand the risks, benefits and expectations and wishes to proceed with surgery.   PCP:  Morton Peters, MD  D/C Plans: Home  PMH: Past Medical History:  Diagnosis Date  . Cancer ([place])   . Hypertension   . Liver disease   . Seizures ([facility])     PSH: Past Surgical History:  Procedure Laterality Date  . BACK SURGERY    . CESAREAN SECTION    . CHOLECYSTECTOMY    . HAND SURGERY    . HEMORRHOID SURGERY    . KNEE SURGERY    . NECK SURGERY    . TOE SURGERY    . TOTAL HIP ARTHROPLASTY Left   . TUBAL LIGATION    . VAGINAL HYSTERECTOMY      Social History:  reports that she has been smoking.  She does not have any smokeless tobacco history on file. She reports that she does not drink alcohol or use drugs.  Allergies:  Allergies  Allergen Reactions  . Codeine Other (See Comments)    Reaction unknown  . Sulfonamide Derivatives Other (See Comments)    Reaction unknown    Medications: No current facility-administered medications for this encounter.    Current Outpatient Prescriptions  Medication Sig Dispense Refill  . albuterol (PROVENTIL HFA;VENTOLIN HFA) 108 (90 BASE) MCG/ACT inhaler Inhale 2 puffs into the lungs every 4 (four) hours as needed for wheezing or shortness of breath (cough). 1 Inhaler 0  . aspirin 81 MG tablet Take 81 mg by mouth daily.    Marland Kitchen azithromycin (ZITHROMAX Z-PAK) 250 MG tablet 2 po day one, then 1 daily x 4 days 5  tablet 0  . benzonatate (TESSALON) 100 MG capsule Take 1 capsule (100 mg total) by mouth every 8 (eight) hours. 21 capsule 0  . DULoxetine (CYMBALTA) 60 MG capsule Take 60 mg by mouth daily.    Marland Kitchen gabapentin (NEURONTIN) 300 MG capsule Take 1 capsule (300 mg total) by mouth at bedtime. 30 capsule 4  . hydrochlorothiazide (HYDRODIURIL) 25 MG tablet Take 25 mg by mouth daily.    Marland Kitchen levETIRAcetam (KEPPRA) 500 MG tablet Take 2 tablets (1,000 mg total) by mouth 2 (two) times daily. (Patient not taking: Reported on 05/09/2014) 120 tablet 11  . oxyCODONE-acetaminophen (PERCOCET) 7.5-325 MG per tablet Take 1 tablet by mouth every 4 (four) hours as needed. For pain    . pantoprazole (PROTONIX) 40 MG tablet Take 40 mg by mouth daily.    . predniSONE (DELTASONE) 50 MG tablet Take 1 tablet daily with breakfast 5 tablet 0  . rOPINIRole (REQUIP) 1 MG tablet Take 1 mg by mouth 2 (two) times daily.       No results found for this or any previous visit (from the past 48 hour(s)). No results found.  ROS: Pain with rom of the right lower extremity  Physical Exam:  Alert and oriented 65 y.o. female in no acute distress Cranial nerves 2-12 intact Cervical spine: full rom with no tenderness, nv intact distally  Chest: active breath sounds bilaterally, no wheeze rhonchi or rales Heart: regular rate and rhythm, no murmur Abd: non tender non distended with active bowel sounds Hip is stable with rom  Right knee with moderate crepitus with rom Medial and lateral joint line tenderness nv intact distally  Assessment/Plan Assessment: right knee end stage osteoarthritis  Plan: Patient will undergo a right total knee by Dr. Veverly Fells at [facility]. Risks benefits and expectations were discussed with the patient. Patient understand risks, benefits and expectations and wishes to proceed.

## 2016-01-02 ENCOUNTER — Encounter (HOSPITAL_COMMUNITY): Payer: Self-pay

## 2016-01-02 ENCOUNTER — Encounter (HOSPITAL_COMMUNITY)
Admission: RE | Admit: 2016-01-02 | Discharge: 2016-01-02 | Disposition: A | Discharge status: Home / Self Care | Payer: Medicare Other | Source: Ambulatory Visit | Attending: Orthopedic Surgery | Admitting: Orthopedic Surgery

## 2016-01-02 DIAGNOSIS — Z01812 Encounter for preprocedural laboratory examination: Secondary | ICD-10-CM | POA: Diagnosis not present

## 2016-01-02 DIAGNOSIS — Z01810 Encounter for preprocedural cardiovascular examination: Secondary | ICD-10-CM | POA: Diagnosis not present

## 2016-01-02 HISTORY — DX: Cerebral infarction, unspecified: I63.9

## 2016-01-02 HISTORY — DX: Sleep apnea, unspecified: G47.30

## 2016-01-02 HISTORY — DX: Prediabetes: R73.03

## 2016-01-02 HISTORY — DX: Chronic obstructive pulmonary disease, unspecified: J44.9

## 2016-01-02 HISTORY — DX: Lateral epicondylitis, unspecified elbow: M77.10

## 2016-01-02 HISTORY — DX: Gastro-esophageal reflux disease without esophagitis: K21.9

## 2016-01-02 LAB — BASIC METABOLIC PANEL
ANION GAP: 6 (ref 5–15)
BUN: 10 mg/dL (ref 6–20)
CO2: 28 mmol/L (ref 22–32)
Calcium: 9.1 mg/dL (ref 8.9–10.3)
Chloride: 103 mmol/L (ref 101–111)
Creatinine, Ser: 0.63 mg/dL (ref 0.44–1.00)
GFR calc Af Amer: >60 mL/min (ref >60)
GFR calc non Af Amer: >60 mL/min (ref >60)
GLUCOSE: 80 mg/dL (ref 65–99)
POTASSIUM: 4 mmol/L (ref 3.5–5.1)
SODIUM: 137 mmol/L (ref 135–145)

## 2016-01-02 LAB — CBC
HCT: 38.7 % (ref 36.0–46.0)
Hemoglobin: 12.6 g/dL (ref 12.0–15.0)
MCH: 27.6 pg (ref 26.0–34.0)
MCHC: 32.6 g/dL (ref 30.0–36.0)
MCV: 84.7 fL (ref 78.0–100.0)
Platelets: 214 K/uL (ref 150–400)
RBC: 4.57 MIL/uL (ref 3.87–5.11)
RDW: 15.4 % (ref 11.5–15.5)
WBC: 6.1 K/uL (ref 4.0–10.5)

## 2016-01-02 LAB — SURGICAL PCR SCREEN
MRSA, PCR: NEGATIVE
STAPHYLOCOCCUS AUREUS: NEGATIVE

## 2016-01-02 NOTE — Pre-Procedure Instructions (Addendum)
[name]  01/02/2016      [id number] [place] [facility] [address] [place] [address]Phone: [phone]: [phone]   Your procedure is scheduled on Friday, December 29.  [facility]               Call this number if you have problems the morning of surgery: [phone]    Remember:  Do not eat food or drink liquids after midnight Thuisday, December 28.  Take these medicines the morning of surgery with A SIP OF WATER:DULoxetine (CYMBALTA), rOPINIRole (REQUIP).                    lansoprazole (PREVACID), oxyCODONE-acetaminophen (PERCOCET).  Use inhaler if needed(bring with you            1 Week prior to surgery(01/05/16) STOP taking Aspirin (unless instructed differently by your surgeon), Aspirin Products (Goody Powder, Excedrin Migraine), Ibuprofen (Advil), Naproxen (Aleve), Vitamins and Herbal Products (ie Fish Oil)                 [place]- Preparing For Surgery  Before surgery, you can play an important role. Because skin is not sterile, your skin needs to be as free of germs as possible. You can reduce the number of germs on your skin by washing with CHG (chlorahexidine gluconate) Soap before surgery.  CHG is an antiseptic cleaner which kills germs and bonds with the skin to continue killing germs even after washing.  Please do not use if you have an allergy to CHG or antibacterial soaps. If your skin becomes reddened/irritated stop using the CHG.  Do not shave (including legs and underarms) for at least 48 hours prior to first CHG shower. It is OK to shave your face.  Please follow these instructions carefully.   1. Shower the NIGHT BEFORE SURGERY and the MORNING OF SURGERY with CHG.   2. If you chose to wash your hair, wash your hair first as usual with your normal shampoo.  3. After you shampoo, rinse your hair and body thoroughly to remove the shampoo.  4. Use CHG  as you would any other liquid soap. You can apply CHG directly to the skin and wash gently with a scrungie or a clean washcloth.   5. Apply the CHG Soap to your body ONLY FROM THE NECK DOWN.  Do not use on open wounds or open sores. Avoid contact with your eyes, ears, mouth and genitals (private parts). Wash genitals (private parts) with your normal soap.  6. Wash thoroughly, paying special attention to the area where your surgery will be performed.  7. Thoroughly rinse your body with warm water from the neck down.  8. DO NOT shower/wash with your normal soap after using and rinsing off the CHG Soap.  9. Pat yourself dry with a CLEAN TOWEL.   10. Wear CLEAN PAJAMAS   11. Place CLEAN SHEETS on your bed the night of your first shower and DO NOT SLEEP WITH PETS.  Day of Surgery: Do not apply any deodorants/lotions. Please wear clean clothes to the hospital/surgery center.    Do not wear jewelry, make-up or nail polish.  Do not wear lotions, powders, or perfumes, or deodorant.  Do not shave 48 hours prior to surgery. .  Do not bring valuables to the hospital.  [facility] belongings or [facility]uables.  Contacts, dentures or bridgework may not be worn into surgery.  Leave your suitcase in the car.  After surgery it may be brought to your room.  For patients admitted to the hospital, discharge time will be determined by your treatment team.  Special instructions: -  Please read over the following fact sheets that you were given: [facility]- Preparing For Surgery and Patient Instructions for Mupirocin Application, Incentive Spirometry, Pain Booklet

## 2016-01-03 DIAGNOSIS — Z79891 Long term (current) use of opiate analgesic: Secondary | ICD-10-CM | POA: Diagnosis not present

## 2016-01-03 DIAGNOSIS — Z4789 Encounter for other orthopedic aftercare: Secondary | ICD-10-CM | POA: Diagnosis not present

## 2016-01-03 DIAGNOSIS — G894 Chronic pain syndrome: Secondary | ICD-10-CM | POA: Diagnosis not present

## 2016-01-03 DIAGNOSIS — M47812 Spondylosis without myelopathy or radiculopathy, cervical region: Secondary | ICD-10-CM | POA: Diagnosis not present

## 2016-01-03 DIAGNOSIS — M150 Primary generalized (osteo)arthritis: Secondary | ICD-10-CM | POA: Diagnosis not present

## 2016-01-03 DIAGNOSIS — G5621 Lesion of ulnar nerve, right upper limb: Secondary | ICD-10-CM | POA: Diagnosis not present

## 2016-01-03 LAB — HEMOGLOBIN A1C
HEMOGLOBIN A1C: 6.1 % — AB (ref 4.8–5.6)
Mean Plasma Glucose: 128 mg/dL

## 2016-01-03 NOTE — Progress Notes (Signed)
Anesthesia Chart Review:  Pt is a 65 year old female scheduled for R total knee arthroplasty on 01/12/2016 with Netta Cedars, MD.   PMH includes:  HTN, stroke, pre-diabetes, COPD, OSA (not using CPAP), liver disease, seizures (not taking meds), GERD. Current smoker. BMI 35.7  Medications include: ASA, Prevacid, Crestor, valsartan  Preoperative labs reviewed.  hgbA1c 6.1, glucose 80  CXR 10/23/15: No active cardiopulmonary disease.  EKG 01/02/16: NSR.   Echo 11/22/15 [facility] cardiovascular): 1. LV cavity normal in size. Normal global wall motion. Grade I diastolic dysfunction, normal LA PE. Calculated EF 63%. 2. Mild mitral regurgitation. 3. Trace tricuspid regurgitation. Unable to estimate PA pressure due to absent/minimal TR signal.  Nuclear stress test 11/06/15 [facility] cardiovascular): 1. Resting EKG shows NSR. Stress EKG is nondiagnostic for ischemia as is a pharmacologic stress test. 2. Breast attenuation artifact in anterior wall on rest images, which improves and stress images. There is no evidence of ischemia or scar. Normal wall motion and normal LV systolic function, EF 27%. This is a low risk study.  Pt saw Neldon Labella, NP with cardiology this past October for pre-op evaluation prior to elbow surgery. Echo and stress test ordered, results above.   If no changes, I anticipate pt can proceed with surgery as scheduled.   Willeen Cass, FNP-BC [facility]/Anesthesiology Phone: [phone] 01/03/2016 12:47 PM

## 2016-01-11 MED ORDER — CEFAZOLIN SODIUM-DEXTROSE 2-4 GM/100ML-% IV SOLN
2.0000 g | INTRAVENOUS | Status: AC
  Administered 2016-01-12: 2 g via INTRAVENOUS
  Filled 2016-01-11: qty 100

## 2016-01-11 MED ORDER — TRANEXAMIC ACID 1000 MG/10ML IV SOLN
1000.0000 mg | INTRAVENOUS | Status: AC
  Administered 2016-01-12: 1000 mg via INTRAVENOUS
  Filled 2016-01-11: qty 10

## 2016-01-12 ENCOUNTER — Inpatient Hospital Stay (HOSPITAL_COMMUNITY): Payer: Medicare Other

## 2016-01-12 ENCOUNTER — Inpatient Hospital Stay (HOSPITAL_COMMUNITY): Payer: Medicare Other | Admitting: Anesthesiology

## 2016-01-12 ENCOUNTER — Encounter (HOSPITAL_COMMUNITY)
Admission: RE | Disposition: A | Discharge status: Home Health | Payer: Self-pay | Source: Ambulatory Visit | Attending: Orthopedic Surgery

## 2016-01-12 ENCOUNTER — Inpatient Hospital Stay (HOSPITAL_COMMUNITY): Payer: Medicare Other | Admitting: Emergency Medicine

## 2016-01-12 ENCOUNTER — Encounter (HOSPITAL_COMMUNITY): Payer: Self-pay | Admitting: Urology

## 2016-01-12 ENCOUNTER — Inpatient Hospital Stay (HOSPITAL_COMMUNITY)
Admission: RE | Admit: 2016-01-12 | Discharge: 2016-01-15 | DRG: 470 | Disposition: A | Discharge status: Home Health | Payer: Medicare Other | Source: Ambulatory Visit | Attending: Orthopedic Surgery | Admitting: Orthopedic Surgery

## 2016-01-12 DIAGNOSIS — M1711 Unilateral primary osteoarthritis, right knee: Principal | ICD-10-CM | POA: Diagnosis present

## 2016-01-12 DIAGNOSIS — Z96651 Presence of right artificial knee joint: Secondary | ICD-10-CM | POA: Diagnosis not present

## 2016-01-12 DIAGNOSIS — Z79899 Other long term (current) drug therapy: Secondary | ICD-10-CM | POA: Diagnosis not present

## 2016-01-12 DIAGNOSIS — Z8673 Personal history of transient ischemic attack (TIA), and cerebral infarction without residual deficits: Secondary | ICD-10-CM

## 2016-01-12 DIAGNOSIS — E119 Type 2 diabetes mellitus without complications: Secondary | ICD-10-CM | POA: Diagnosis present

## 2016-01-12 DIAGNOSIS — K769 Liver disease, unspecified: Secondary | ICD-10-CM | POA: Diagnosis present

## 2016-01-12 DIAGNOSIS — M25661 Stiffness of right knee, not elsewhere classified: Secondary | ICD-10-CM

## 2016-01-12 DIAGNOSIS — Z471 Aftercare following joint replacement surgery: Secondary | ICD-10-CM | POA: Diagnosis not present

## 2016-01-12 DIAGNOSIS — Z9114 Patient's other noncompliance with medication regimen: Secondary | ICD-10-CM | POA: Diagnosis not present

## 2016-01-12 DIAGNOSIS — G8918 Other acute postprocedural pain: Secondary | ICD-10-CM | POA: Diagnosis not present

## 2016-01-12 DIAGNOSIS — I10 Essential (primary) hypertension: Secondary | ICD-10-CM | POA: Diagnosis present

## 2016-01-12 DIAGNOSIS — Z8541 Personal history of malignant neoplasm of cervix uteri: Secondary | ICD-10-CM

## 2016-01-12 DIAGNOSIS — R262 Difficulty in walking, not elsewhere classified: Secondary | ICD-10-CM

## 2016-01-12 DIAGNOSIS — Z7982 Long term (current) use of aspirin: Secondary | ICD-10-CM | POA: Diagnosis not present

## 2016-01-12 DIAGNOSIS — K219 Gastro-esophageal reflux disease without esophagitis: Secondary | ICD-10-CM | POA: Diagnosis present

## 2016-01-12 DIAGNOSIS — Z96642 Presence of left artificial hip joint: Secondary | ICD-10-CM | POA: Diagnosis present

## 2016-01-12 DIAGNOSIS — G4730 Sleep apnea, unspecified: Secondary | ICD-10-CM | POA: Diagnosis present

## 2016-01-12 DIAGNOSIS — J449 Chronic obstructive pulmonary disease, unspecified: Secondary | ICD-10-CM | POA: Diagnosis not present

## 2016-01-12 DIAGNOSIS — F418 Other specified anxiety disorders: Secondary | ICD-10-CM | POA: Diagnosis not present

## 2016-01-12 DIAGNOSIS — F17200 Nicotine dependence, unspecified, uncomplicated: Secondary | ICD-10-CM | POA: Diagnosis not present

## 2016-01-12 DIAGNOSIS — M25561 Pain in right knee: Secondary | ICD-10-CM | POA: Diagnosis not present

## 2016-01-12 HISTORY — PX: TOTAL KNEE ARTHROPLASTY: SHX125

## 2016-01-12 LAB — GLUCOSE, CAPILLARY
Glucose-Capillary: 110 mg/dL — ABNORMAL HIGH (ref 65–99)
Glucose-Capillary: 127 mg/dL — ABNORMAL HIGH (ref 65–99)

## 2016-01-12 LAB — CREATININE, SERUM
Creatinine, Ser: 0.64 mg/dL (ref 0.44–1.00)
GFR calc Af Amer: >60 mL/min (ref >60)
GFR calc non Af Amer: >60 mL/min (ref >60)

## 2016-01-12 LAB — CBC
HEMATOCRIT: 38.7 % (ref 36.0–46.0)
Hemoglobin: 12.4 g/dL (ref 12.0–15.0)
MCH: 27.7 pg (ref 26.0–34.0)
MCHC: 32 g/dL (ref 30.0–36.0)
MCV: 86.6 fL (ref 78.0–100.0)
PLATELETS: 163 10*3/uL (ref 150–400)
RBC: 4.47 MIL/uL (ref 3.87–5.11)
RDW: 15.7 % — AB (ref 11.5–15.5)
WBC: 8.2 10*3/uL (ref 4.0–10.5)

## 2016-01-12 SURGERY — ARTHROPLASTY, KNEE, TOTAL
Anesthesia: Monitor Anesthesia Care | Site: Knee | Laterality: Right

## 2016-01-12 MED ORDER — METOCLOPRAMIDE HCL 5 MG/ML IJ SOLN: 5.0000 mg | Freq: Three times a day (TID) | INTRAMUSCULAR | Status: DC | PRN

## 2016-01-12 MED ORDER — ROPIVACAINE HCL 7.5 MG/ML IJ SOLN
INTRAMUSCULAR | Status: DC | PRN
  Administered 2016-01-12: 20 mL via PERINEURAL

## 2016-01-12 MED ORDER — MENTHOL 3 MG MT LOZG: 1.0000 | LOZENGE | OROMUCOSAL | Status: DC | PRN

## 2016-01-12 MED ORDER — ALBUTEROL SULFATE (2.5 MG/3ML) 0.083% IN NEBU: 2.5000 mg | INHALATION_SOLUTION | RESPIRATORY_TRACT | Status: DC | PRN

## 2016-01-12 MED ORDER — OXYCODONE-ACETAMINOPHEN 7.5-325 MG PO TABS
1.0000 | ORAL_TABLET | ORAL | Status: DC | PRN
  Administered 2016-01-12 (×2): 1 via ORAL
  Administered 2016-01-13 – 2016-01-14 (×7): 2 via ORAL
  Filled 2016-01-12 (×4): qty 2
  Filled 2016-01-12: qty 1
  Filled 2016-01-12: qty 2
  Filled 2016-01-12: qty 1
  Filled 2016-01-12 (×3): qty 2

## 2016-01-12 MED ORDER — ONDANSETRON HCL 4 MG/2ML IJ SOLN: 4.0000 mg | Freq: Four times a day (QID) | INTRAMUSCULAR | Status: DC | PRN

## 2016-01-12 MED ORDER — ASPIRIN EC 81 MG PO TBEC
81.0000 mg | DELAYED_RELEASE_TABLET | Freq: Every day | ORAL | Status: DC
  Administered 2016-01-13 – 2016-01-15 (×3): 81 mg via ORAL
  Filled 2016-01-12 (×4): qty 1

## 2016-01-12 MED ORDER — PANTOPRAZOLE SODIUM 40 MG PO TBEC
40.0000 mg | DELAYED_RELEASE_TABLET | Freq: Every day | ORAL | Status: DC
  Administered 2016-01-12 – 2016-01-15 (×4): 40 mg via ORAL
  Filled 2016-01-12 (×4): qty 1

## 2016-01-12 MED ORDER — OXYCODONE-ACETAMINOPHEN 7.5-325 MG PO TABS
1.0000 | ORAL_TABLET | ORAL | 0 refills | Status: DC | PRN
Start: 2016-01-12 — End: 2017-10-13

## 2016-01-12 MED ORDER — ROSUVASTATIN CALCIUM 10 MG PO TABS
10.0000 mg | ORAL_TABLET | Freq: Every day | ORAL | Status: DC
  Administered 2016-01-12 – 2016-01-14 (×3): 10 mg via ORAL
  Filled 2016-01-12 (×3): qty 1

## 2016-01-12 MED ORDER — ONDANSETRON HCL 4 MG/2ML IJ SOLN
INTRAMUSCULAR | Status: DC | PRN
  Administered 2016-01-12: 4 mg via INTRAVENOUS

## 2016-01-12 MED ORDER — METHOCARBAMOL 500 MG PO TABS: 500.0000 mg | ORAL_TABLET | Freq: Three times a day (TID) | ORAL | 1 refills | Status: DC | PRN

## 2016-01-12 MED ORDER — WARFARIN SODIUM 5 MG PO TABS: 5.0000 mg | ORAL_TABLET | Freq: Every day | ORAL | 0 refills | Status: DC

## 2016-01-12 MED ORDER — SODIUM CHLORIDE 0.9 % IR SOLN
Status: DC | PRN
  Administered 2016-01-12: 3000 mL

## 2016-01-12 MED ORDER — ONDANSETRON HCL 4 MG PO TABS: 4.0000 mg | ORAL_TABLET | Freq: Four times a day (QID) | ORAL | Status: DC | PRN

## 2016-01-12 MED ORDER — HYDROMORPHONE HCL 2 MG/ML IJ SOLN
1.0000 mg | INTRAMUSCULAR | Status: DC | PRN
  Administered 2016-01-12: 2 mg via INTRAVENOUS
  Administered 2016-01-12 (×2): 1 mg via INTRAVENOUS
  Administered 2016-01-13: 2 mg via INTRAVENOUS
  Filled 2016-01-12 (×4): qty 1

## 2016-01-12 MED ORDER — PHENOL 1.4 % MT LIQD: 1.0000 | OROMUCOSAL | Status: DC | PRN

## 2016-01-12 MED ORDER — METHOCARBAMOL 500 MG PO TABS
500.0000 mg | ORAL_TABLET | Freq: Four times a day (QID) | ORAL | Status: DC | PRN
  Administered 2016-01-12 – 2016-01-14 (×5): 500 mg via ORAL
  Filled 2016-01-12 (×5): qty 1

## 2016-01-12 MED ORDER — ENOXAPARIN SODIUM 40 MG/0.4ML ~~LOC~~ SOLN: 40.0000 mg | SUBCUTANEOUS | 0 refills | Status: DC

## 2016-01-12 MED ORDER — MIDAZOLAM HCL 2 MG/2ML IJ SOLN
INTRAMUSCULAR | Status: AC
Start: 2016-01-12 — End: 2016-01-12
  Filled 2016-01-12: qty 2

## 2016-01-12 MED ORDER — ONDANSETRON HCL 4 MG/2ML IJ SOLN: 4.0000 mg | Freq: Once | INTRAMUSCULAR | Status: DC | PRN

## 2016-01-12 MED ORDER — PHENYLEPHRINE HCL 10 MG/ML IJ SOLN
INTRAMUSCULAR | Status: DC | PRN
  Administered 2016-01-12: 50 ug/min via INTRAVENOUS

## 2016-01-12 MED ORDER — PROPOFOL 500 MG/50ML IV EMUL
INTRAVENOUS | Status: AC
  Filled 2016-01-12: qty 50

## 2016-01-12 MED ORDER — CHLORHEXIDINE GLUCONATE 4 % EX LIQD: 60.0000 mL | Freq: Once | CUTANEOUS | Status: DC

## 2016-01-12 MED ORDER — IRBESARTAN 300 MG PO TABS
300.0000 mg | ORAL_TABLET | Freq: Every day | ORAL | Status: DC
  Administered 2016-01-12 – 2016-01-15 (×4): 300 mg via ORAL
  Filled 2016-01-12 (×4): qty 1

## 2016-01-12 MED ORDER — METOCLOPRAMIDE HCL 5 MG PO TABS: 5.0000 mg | ORAL_TABLET | Freq: Three times a day (TID) | ORAL | Status: DC | PRN

## 2016-01-12 MED ORDER — CEFAZOLIN SODIUM-DEXTROSE 2-4 GM/100ML-% IV SOLN
2.0000 g | Freq: Four times a day (QID) | INTRAVENOUS | Status: AC
  Administered 2016-01-12 (×2): 2 g via INTRAVENOUS
  Filled 2016-01-12 (×2): qty 100

## 2016-01-12 MED ORDER — FENTANYL CITRATE (PF) 100 MCG/2ML IJ SOLN: 25.0000 ug | INTRAMUSCULAR | Status: DC | PRN

## 2016-01-12 MED ORDER — POLYETHYLENE GLYCOL 3350 17 G PO PACK
17.0000 g | PACK | Freq: Every day | ORAL | Status: DC | PRN
  Administered 2016-01-13 – 2016-01-14 (×2): 17 g via ORAL
  Filled 2016-01-12 (×2): qty 1

## 2016-01-12 MED ORDER — BUPIVACAINE IN DEXTROSE 0.75-8.25 % IT SOLN
INTRATHECAL | Status: DC | PRN
  Administered 2016-01-12: 2 mL via INTRATHECAL

## 2016-01-12 MED ORDER — MIDAZOLAM HCL 5 MG/5ML IJ SOLN
INTRAMUSCULAR | Status: DC | PRN
  Administered 2016-01-12: 2 mg via INTRAVENOUS

## 2016-01-12 MED ORDER — PROPOFOL 500 MG/50ML IV EMUL
INTRAVENOUS | Status: DC | PRN
  Administered 2016-01-12: 100 ug/kg/min via INTRAVENOUS

## 2016-01-12 MED ORDER — SODIUM CHLORIDE 0.9 % IV SOLN
INTRAVENOUS | Status: DC
  Administered 2016-01-12: 13:00:00 via INTRAVENOUS

## 2016-01-12 MED ORDER — WARFARIN SODIUM 5 MG PO TABS
5.0000 mg | ORAL_TABLET | Freq: Once | ORAL | Status: AC
  Administered 2016-01-12: 5 mg via ORAL
  Filled 2016-01-12: qty 1

## 2016-01-12 MED ORDER — WARFARIN - PHARMACIST DOSING INPATIENT
Freq: Every day | Status: DC
  Administered 2016-01-12: 18:00:00

## 2016-01-12 MED ORDER — OXYCODONE-ACETAMINOPHEN 5-325 MG PO TABS: 1.0000 | ORAL_TABLET | ORAL | 0 refills | Status: DC | PRN

## 2016-01-12 MED ORDER — ROPINIROLE HCL 1 MG PO TABS
1.0000 mg | ORAL_TABLET | Freq: Two times a day (BID) | ORAL | Status: DC
  Administered 2016-01-12 – 2016-01-15 (×6): 1 mg via ORAL
  Filled 2016-01-12 (×6): qty 1

## 2016-01-12 MED ORDER — ENOXAPARIN SODIUM 30 MG/0.3ML ~~LOC~~ SOLN
30.0000 mg | Freq: Two times a day (BID) | SUBCUTANEOUS | Status: DC
  Administered 2016-01-13 – 2016-01-14 (×4): 30 mg via SUBCUTANEOUS
  Filled 2016-01-12 (×4): qty 0.3

## 2016-01-12 MED ORDER — PROPOFOL 10 MG/ML IV BOLUS
INTRAVENOUS | Status: AC
  Filled 2016-01-12: qty 20

## 2016-01-12 MED ORDER — DOCUSATE SODIUM 100 MG PO CAPS
100.0000 mg | ORAL_CAPSULE | Freq: Two times a day (BID) | ORAL | Status: DC
  Administered 2016-01-12 – 2016-01-15 (×7): 100 mg via ORAL
  Filled 2016-01-12 (×7): qty 1

## 2016-01-12 MED ORDER — FENTANYL CITRATE (PF) 100 MCG/2ML IJ SOLN
INTRAMUSCULAR | Status: AC
  Filled 2016-01-12: qty 2

## 2016-01-12 MED ORDER — ACETAMINOPHEN 650 MG RE SUPP: 650.0000 mg | Freq: Four times a day (QID) | RECTAL | Status: DC | PRN

## 2016-01-12 MED ORDER — FERROUS SULFATE 325 (65 FE) MG PO TABS
325.0000 mg | ORAL_TABLET | Freq: Three times a day (TID) | ORAL | Status: DC
  Administered 2016-01-12 – 2016-01-15 (×10): 325 mg via ORAL
  Filled 2016-01-12 (×8): qty 1

## 2016-01-12 MED ORDER — DULOXETINE HCL 60 MG PO CPEP
60.0000 mg | ORAL_CAPSULE | Freq: Every day | ORAL | Status: DC
  Administered 2016-01-13 – 2016-01-15 (×3): 60 mg via ORAL
  Filled 2016-01-12 (×3): qty 1

## 2016-01-12 MED ORDER — ACETAMINOPHEN 325 MG PO TABS
650.0000 mg | ORAL_TABLET | Freq: Four times a day (QID) | ORAL | Status: DC | PRN
Start: 2016-01-12 — End: 2016-01-15
  Administered 2016-01-12: 650 mg via ORAL
  Filled 2016-01-12: qty 2

## 2016-01-12 MED ORDER — 0.9 % SODIUM CHLORIDE (POUR BTL) OPTIME
TOPICAL | Status: DC | PRN
  Administered 2016-01-12: 1000 mL

## 2016-01-12 MED ORDER — BISACODYL 10 MG RE SUPP: 10.0000 mg | Freq: Every day | RECTAL | Status: DC | PRN

## 2016-01-12 MED ORDER — METHOCARBAMOL 1000 MG/10ML IJ SOLN
500.0000 mg | Freq: Four times a day (QID) | INTRAVENOUS | Status: DC | PRN
  Filled 2016-01-12: qty 5

## 2016-01-12 MED ORDER — SODIUM CHLORIDE 0.9 % IV SOLN
2000.0000 mg | INTRAVENOUS | Status: AC
  Administered 2016-01-12: 2000 mg via TOPICAL
  Filled 2016-01-12: qty 20

## 2016-01-12 MED ORDER — LACTATED RINGERS IV SOLN
INTRAVENOUS | Status: DC | PRN
  Administered 2016-01-12: 07:00:00 via INTRAVENOUS

## 2016-01-12 SURGICAL SUPPLY — 64 items
BANDAGE ESMARK 6X9 LF (GAUZE/BANDAGES/DRESSINGS) ×1 IMPLANT
BLADE SAG 18X100X1.27 (BLADE) ×3 IMPLANT
BLADE SAW SGTL 13X75X1.27 (BLADE) ×3 IMPLANT
BLADE SAW SGTL 18X1.27X75 (BLADE) ×2 IMPLANT
BLADE SAW SGTL 18X1.27X75MM (BLADE) ×1
BNDG CMPR 9X6 STRL LF SNTH (GAUZE/BANDAGES/DRESSINGS) ×1
BNDG CMPR MED 10X6 ELC LF (GAUZE/BANDAGES/DRESSINGS) ×1
BNDG ELASTIC 6X10 VLCR STRL LF (GAUZE/BANDAGES/DRESSINGS) ×3 IMPLANT
BNDG ESMARK 6X9 LF (GAUZE/BANDAGES/DRESSINGS) ×3
BNDG GAUZE ELAST 4 BULKY (GAUZE/BANDAGES/DRESSINGS) ×6 IMPLANT
BOWL SMART MIX CTS (DISPOSABLE) ×3 IMPLANT
CAP KNEE TOTAL 3 SIGMA ×2 IMPLANT
CEMENT HV SMART SET (Cement) ×6 IMPLANT
CLOSURE WOUND 1/2 X4 (GAUZE/BANDAGES/DRESSINGS) ×2
COVER SURGICAL LIGHT HANDLE (MISCELLANEOUS) ×3 IMPLANT
CUFF TOURNIQUET SINGLE 34IN LL (TOURNIQUET CUFF) ×2 IMPLANT
CUFF TOURNIQUET SINGLE 44IN (TOURNIQUET CUFF) IMPLANT
DRAPE HALF SHEET 40X57 (DRAPES) ×3 IMPLANT
DRAPE IMP U-DRAPE 54X76 (DRAPES) ×3 IMPLANT
DRAPE PROXIMA HALF (DRAPES) ×3 IMPLANT
DRAPE U-SHAPE 47X51 STRL (DRAPES) ×3 IMPLANT
DRSG ADAPTIC 3X8 NADH LF (GAUZE/BANDAGES/DRESSINGS) ×3 IMPLANT
DRSG PAD ABDOMINAL 8X10 ST (GAUZE/BANDAGES/DRESSINGS) ×4 IMPLANT
DURAPREP 26ML APPLICATOR (WOUND CARE) ×5 IMPLANT
ELECT CAUTERY BLADE 6.4 (BLADE) ×3 IMPLANT
ELECT REM PT RETURN 9FT ADLT (ELECTROSURGICAL) ×3
ELECTRODE REM PT RTRN 9FT ADLT (ELECTROSURGICAL) ×1 IMPLANT
GAUZE SPONGE 4X4 12PLY STRL (GAUZE/BANDAGES/DRESSINGS) ×3 IMPLANT
GLOVE BIOGEL PI ORTHO PRO 7.5 (GLOVE) ×2
GLOVE BIOGEL PI ORTHO PRO SZ7 (GLOVE) ×2
GLOVE BIOGEL PI ORTHO PRO SZ8 (GLOVE) ×2
GLOVE ORTHO TXT STRL SZ7.5 (GLOVE) ×3 IMPLANT
GLOVE PI ORTHO PRO STRL 7.5 (GLOVE) ×1 IMPLANT
GLOVE PI ORTHO PRO STRL SZ7 (GLOVE) ×1 IMPLANT
GLOVE PI ORTHO PRO STRL SZ8 (GLOVE) ×1 IMPLANT
GLOVE SURG ORTHO 8.5 STRL (GLOVE) ×3 IMPLANT
GOWN STRL REUS W/ TWL XL LVL3 (GOWN DISPOSABLE) ×3 IMPLANT
GOWN STRL REUS W/TWL XL LVL3 (GOWN DISPOSABLE) ×9
HANDPIECE INTERPULSE COAX TIP (DISPOSABLE) ×3
IMMOBILIZER KNEE 22 UNIV (SOFTGOODS) ×2 IMPLANT
KIT BASIN OR (CUSTOM PROCEDURE TRAY) ×3 IMPLANT
KIT MANIFOLD (MISCELLANEOUS) ×3 IMPLANT
KIT ROOM TURNOVER OR (KITS) ×3 IMPLANT
MANIFOLD NEPTUNE II (INSTRUMENTS) ×3 IMPLANT
NS IRRIG 1000ML POUR BTL (IV SOLUTION) ×3 IMPLANT
PACK TOTAL JOINT (CUSTOM PROCEDURE TRAY) ×3 IMPLANT
PACK UNIVERSAL I (CUSTOM PROCEDURE TRAY) ×3 IMPLANT
PAD ARMBOARD 7.5X6 YLW CONV (MISCELLANEOUS) ×6 IMPLANT
SET HNDPC FAN SPRY TIP SCT (DISPOSABLE) ×1 IMPLANT
STRIP CLOSURE SKIN 1/2X4 (GAUZE/BANDAGES/DRESSINGS) ×4 IMPLANT
SUCTION FRAZIER HANDLE 10FR (MISCELLANEOUS) ×2
SUCTION TUBE FRAZIER 10FR DISP (MISCELLANEOUS) ×1 IMPLANT
SUT MNCRL AB 3-0 PS2 18 (SUTURE) ×3 IMPLANT
SUT VIC AB 0 CT1 27 (SUTURE) ×6
SUT VIC AB 0 CT1 27XBRD ANBCTR (SUTURE) ×2 IMPLANT
SUT VIC AB 1 CT1 27 (SUTURE) ×9
SUT VIC AB 1 CT1 27XBRD ANBCTR (SUTURE) ×3 IMPLANT
SUT VIC AB 2-0 CT1 27 (SUTURE) ×6
SUT VIC AB 2-0 CT1 TAPERPNT 27 (SUTURE) ×2 IMPLANT
TOWEL OR 17X24 6PK STRL BLUE (TOWEL DISPOSABLE) ×3 IMPLANT
TOWEL OR 17X26 10 PK STRL BLUE (TOWEL DISPOSABLE) ×3 IMPLANT
TRAY CATH 16FR W/PLASTIC CATH (SET/KITS/TRAYS/PACK) IMPLANT
TRAY FOLEY CATH 16FRSI W/METER (SET/KITS/TRAYS/PACK) ×2 IMPLANT
WATER STERILE IRR 1000ML POUR (IV SOLUTION) ×2 IMPLANT

## 2016-01-12 NOTE — Anesthesia Preprocedure Evaluation (Addendum)
Anesthesia Evaluation  Patient identified by MRN, date of birth, ID band Patient awake    Reviewed: Allergy & Precautions, NPO status , Patient's Chart, lab work & pertinent test results  Airway Mallampati: II  TM Distance: >3 FB Neck ROM: Full    Dental  (+) Teeth Intact, Dental Advisory Given, Missing, Edentulous Upper,    Pulmonary sleep apnea , COPD,  COPD inhaler, Current Smoker,    Pulmonary exam normal breath sounds clear to auscultation       Cardiovascular hypertension, Pt. on medications (-) angina(-) CAD, (-) Past MI and (-) CHF Normal cardiovascular exam Rhythm:Regular Rate:Normal  EKG 01/02/16: NSR.   Echo 11/22/15 [facility] cardiovascular): 1. LV cavity normal in size. Normal global wall motion. Grade I diastolic dysfunction, normal LA PE. Calculated EF 63%. 2. Mild mitral regurgitation. 3. Trace tricuspid regurgitation. Unable to estimate PA pressure due to absent/minimal TR signal.  Nuclear stress test 11/06/15 [facility] cardiovascular): 1. Resting EKG shows NSR. Stress EKG is nondiagnostic for ischemia as is a pharmacologic stress test. 2. Breast attenuation artifact in anterior wall on rest images, which improves and stress images. There is no evidence of ischemia or scar. Normal wall motion and normal LV systolic function, EF 93%. This is a low risk study.   Neuro/Psych Seizures -, Well Controlled,  PSYCHIATRIC DISORDERS Depression CVA, No Residual Symptoms    GI/Hepatic Neg liver ROS, GERD  Medicated,  Endo/Other  Obesity   Renal/GU negative Renal ROS     Musculoskeletal  (+) Arthritis , Osteoarthritis,    Abdominal   Peds  Hematology negative hematology ROS (+) Plt 214k   Anesthesia Other Findings Day of surgery medications reviewed with the patient.  Reproductive/Obstetrics                           Anesthesia Physical Anesthesia Plan  ASA: III  Anesthesia Plan:  Spinal and MAC   Post-op Pain Management:  Regional for Post-op pain   Induction: Intravenous  Airway Management Planned: Simple Face Mask  Additional Equipment:   Intra-op Plan:   Post-operative Plan:   Informed Consent: I have reviewed the patients History and Physical, chart, labs and discussed the procedure including the risks, benefits and alternatives for the proposed anesthesia with the patient or authorized representative who has indicated his/her understanding and acceptance.   Dental advisory given  Plan Discussed with: CRNA, Anesthesiologist and Surgeon  Anesthesia Plan Comments: (Discussed risks and benefits of and differences between spinal and general. Discussed risks of spinal including headache, backache, failure, bleeding, infection, and nerve damage. Patient consents to spinal. Questions answered. Coagulation studies and platelet count acceptable.  Discussed risks and benefits of adductor canal block including failure, bleeding, infection, nerve damage, weakness. Discussed that the block may not prevent all of the pain in the knee. Questions answered. Patient consents to block. )        Anesthesia Quick Evaluation

## 2016-01-12 NOTE — Anesthesia Procedure Notes (Signed)
Anesthesia Regional Block:  Adductor canal block  Pre-Anesthetic Checklist: ,, timeout performed, Correct Patient, Correct Site, Correct Laterality, Correct Procedure, Correct Position, site marked, Risks and benefits discussed,  Surgical consent,  Pre-op evaluation,  At surgeon's request and post-op pain management  Laterality: Right  Prep: chloraprep       Needles:  Injection technique: Single-shot  Needle Type: Echogenic Needle     Needle Length: 9cm 9 cm Needle Gauge: 21 and 21 G    Additional Needles:  Procedures: ultrasound guided (picture in chart) Adductor canal block Narrative:  Start time: 01/12/2016 7:10 AM End time: 01/12/2016 7:15 AM Injection made incrementally with aspirations every 5 mL.  Performed by: Personally  Anesthesiologist: Catalina Gravel  Additional Notes: No pain on injection. No increased resistance to injection. Injection made in 5cc increments.  Good needle visualization.  Patient tolerated procedure well.

## 2016-01-12 NOTE — Evaluation (Signed)
Physical Therapy Evaluation Patient Details Name: [name] MRN: [id number] DOB: 22-May-1950 Today's Date: 01/12/2016   History of Present Illness  65 yo female admitted to [facility] on 01/12/16 for Right TKA due to severe R knee OA. PMH significant for CA, HTN, liver disease, siezures, L THA.  Clinical Impression  Pt is POD 0 and moving slowly with therapy. Prior to admission, pt was completely independent and living with her husband in a single level home. Pt needs to perform stair negotiation and increase gait distance before she will be safe to return home. Pt restless in bed this session due to pain and slightly impulsive with mobility including sit to stand transfers. Pt was in 10/10 pain and RN brought in IV and oral pain medications during this session. Pt required encouragement to sit back in recliner with LE's elevated prior to therapy leaving her. Pt will benefit from continuing to be seen acutely in order to address the below deficits.    Follow Up Recommendations Home health PT;Supervision for mobility/OOB    Equipment Recommendations  3in1 (PT);Other (comment) (CPM)    Recommendations for Other Services       Precautions / Restrictions Precautions Precautions: Knee Precaution Booklet Issued: Yes (comment) Precaution Comments: Handout was given, verbally reviewed. Will need reinforcement Required Braces or Orthoses: Knee Immobilizer - Right Knee Immobilizer - Right: On when out of bed or walking Restrictions Weight Bearing Restrictions: Yes RLE Weight Bearing: Weight bearing as tolerated      Mobility  Bed Mobility Overal bed mobility: Needs Assistance Bed Mobility: Supine to Sit     Supine to sit: Min assist;HOB elevated     General bed mobility comments: Min A to bring RLE EOB with use of railings to pull self to EOB  Transfers Overall transfer level: Needs assistance Equipment used: Rolling walker (2 wheeled) Transfers: Sit to/from Stand Sit to Stand:  Min assist         General transfer comment: min A for safety from EOB, cues for proper hand placement when standing and assistance to stabilize at trunk upon standing  Ambulation/Gait Ambulation/Gait assistance: Min assist Ambulation Distance (Feet): 25 Feet Assistive device: Rolling walker (2 wheeled) Gait Pattern/deviations: Step-to pattern;Decreased step length - left;Decreased stance time - right;Decreased weight shift to right;Antalgic Gait velocity: decreased Gait velocity interpretation: Below normal speed for age/gender General Gait Details: Moderate antalgic gait with decreased weight bearing through RLE. pt becomes unsteady with gait and requires chair to be brought to her to sit down.   Stairs            Wheelchair Mobility    Modified Rankin (Stroke Patients Only)       Balance Overall balance assessment: Needs assistance Sitting-balance support: Single extremity supported;Feet supported Sitting balance-Leahy Scale: Fair Sitting balance - Comments: Sitting EOB with no back support   Standing balance support: Bilateral upper extremity supported Standing balance-Leahy Scale: Poor Standing balance comment: relies on RW for stability in standing and LOB x 2 when removing UE's from RW requiring clinician assistance to stabilize                             Pertinent Vitals/Pain Pain Assessment: 0-10 Pain Score: 9  Pain Location: Right knee and low back Pain Descriptors / Indicators: Cramping;Guarding;Moaning Pain Intervention(s): Monitored during session;Premedicated before session;Patient requesting pain meds-RN notified;RN gave pain meds during session    Home Living Family/patient expects to be discharged to::  Private residence Living Arrangements: Spouse/significant other Available Help at Discharge: Family;Available 24 hours/day;Home health Type of Home: House Home Access: Stairs to enter Entrance Stairs-Rails: None Entrance Stairs-Number  of Steps: 4 (3 to porch and 1 into house) Home Layout: One level Home Equipment: [place] - 2 wheels;Shower seat - built in;Grab bars - tub/shower      Prior Function Level of Independence: Independent         Comments: was driving and performing all ADL's and IADLs     Hand Dominance   Dominant Hand: Right    Extremity/Trunk Assessment   Upper Extremity Assessment Upper Extremity Assessment: Defer to OT evaluation    Lower Extremity Assessment Lower Extremity Assessment: RLE deficits/detail RLE Deficits / Details: pt with normal post op pain and weakness. At least 3/5 ankle and 2/5 knee and hip per gross functional assessment       Communication   Communication: No difficulties  Cognition Arousal/Alertness: Awake/alert Behavior During Therapy: WFL for tasks assessed/performed Overall Cognitive Status: Within Functional Limits for tasks assessed (became a little lathargic following IV pain meds)                      General Comments General comments (skin integrity, edema, etc.): Husband present throughout assessment as assists with moving recliner closer.     Exercises     Assessment/Plan    PT Assessment Patient needs continued PT services  PT Problem List Decreased strength;Decreased range of motion;Decreased activity tolerance;Decreased balance;Decreased mobility;Decreased knowledge of use of DME;Pain          PT Treatment Interventions DME instruction;Gait training;Stair training;Functional mobility training;Therapeutic activities;Therapeutic exercise;Balance training;Patient/family education    PT Goals (Current goals can be found in the Care Plan section)  Acute Rehab PT Goals Patient Stated Goal: to get home as quickly as she can PT Goal Formulation: With patient Time For Goal Achievement: 01/19/16 Potential to Achieve Goals: Good    Frequency 7X/week   Barriers to discharge        Co-evaluation               End of Session  Equipment Utilized During Treatment: Gait belt Activity Tolerance: Patient limited by pain Patient left: in chair;with call bell/phone within reach;with family/visitor present Nurse Communication: Mobility status;Patient requests pain meds;Weight bearing status         Time: 1520-1602 PT Time Calculation (min) (ACUTE ONLY): 42 min   Charges:   PT Evaluation $PT Eval Moderate Complexity: 1 Procedure PT Treatments $Gait Training: 8-22 mins $Therapeutic Activity: 8-22 mins   PT G Codes:        Scheryl Marten PT, DPT  [phone]  01/12/2016, 4:11 PM

## 2016-01-12 NOTE — Progress Notes (Signed)
ANTICOAGULATION CONSULT NOTE - Initial Consult  Pharmacy Consult for Coumadin Indication: VTE prophylaxis  Allergies  Allergen Reactions  . Bee Venom Anaphylaxis  . Codeine Hives  . Sulfonamide Derivatives     UNSPECIFIED REACTION OF CHILDHOOD     Patient Measurements: TBW 97 kg  Vital Signs: Temp: 96.5 F (35.8 C) (12/29 0938) Temp Source: Oral (12/29 0604) BP: 147/75 (12/29 1220) Pulse Rate: 76 (12/29 1220)   Assessment: 65 yo F Now s/p R TKA to start Coumadin. Also on Lovenox until INR therapeutic. No anticoag PTA. CBC stable on 12/19.   Goal of Therapy:  INR 2-3 Monitor platelets by anticoagulation protocol: Yes   Plan:  Give Coumadin '5mg'$  PO x 1 Bridge with Lovenox '30mg'$  [place] Q12 per Ortho Monitor daily INR, CBC, s/s of bleed  [name] J 01/12/2016,12:27 PM

## 2016-01-12 NOTE — Discharge Instructions (Signed)
Ice to the knee as much as possible.  Keep incision covered and clean and dry for one week, then ok to get incision wet in the shower.    DO NOT prop anything behind the knee, it will make your knee very stiff.  DO exercises every hour to prevent stiffness.  Home CPM 0-90 degrees 6 hours per day in two hour increments, increase as tolerated.  Sleep in the knee immobilizer to keep knee straight at night.  Follow up in the office in two weeks with Dr Veverly Fells [phone]   Information on my medicine - Coumadin   (Warfarin)  This medication education was reviewed with me or my healthcare representative as part of my discharge preparation.    Why was Coumadin prescribed for you? Coumadin was prescribed for you because you have a blood clot or a medical condition that can cause an increased risk of forming blood clots. Blood clots can cause serious health problems by blocking the flow of blood to the heart, lung, or brain. Coumadin can prevent harmful blood clots from forming. As a reminder your indication for Coumadin is:   Blood Clot Prevention After Orthopedic Surgery   What test will check on my response to Coumadin? While on Coumadin (warfarin) you will need to have an INR test regularly to ensure that your dose is keeping you in the desired range. The INR (international normalized ratio) number is calculated from the result of the laboratory test called prothrombin time (PT).  If an INR APPOINTMENT HAS NOT ALREADY BEEN MADE FOR YOU please schedule an appointment to have this lab work done by your health care provider within 7 days. Your INR goal is usually a number between:  2 to 3 or your provider may give you a more narrow range like 2-2.5.  Ask your health care provider during an office visit what your goal INR is.  What  do you need to  know  About  COUMADIN? Take Coumadin (warfarin) exactly as prescribed by your healthcare provider about the same time each day.  DO NOT stop taking  without talking to the doctor who prescribed the medication.  Stopping without other blood clot prevention medication to take the place of Coumadin may increase your risk of developing a new clot or stroke.  Get refills before you run out.  What do you do if you miss a dose? If you miss a dose, take it as soon as you remember on the same day then continue your regularly scheduled regimen the next day.  Do not take two doses of Coumadin at the same time.  Important Safety Information A possible side effect of Coumadin (Warfarin) is an increased risk of bleeding. You should call your healthcare provider right away if you experience any of the following: ? Bleeding from an injury or your nose that does not stop. ? Unusual colored urine (red or dark brown) or unusual colored stools (red or black). ? Unusual bruising for unknown reasons. ? A serious fall or if you hit your head (even if there is no bleeding).  Some foods or medicines interact with Coumadin (warfarin) and might alter your response to warfarin. To help avoid this: ? Eat a balanced diet, maintaining a consistent amount of Vitamin K. ? Notify your provider about major diet changes you plan to make. ? Avoid alcohol or limit your intake to 1 drink for women and 2 drinks for men per day. (1 drink is 5 oz. wine, 12 oz.  beer, or 1.5 oz. liquor.)  Make sure that ANY health care provider who prescribes medication for you knows that you are taking Coumadin (warfarin).  Also make sure the healthcare provider who is monitoring your Coumadin knows when you have started a new medication including herbals and non-prescription products.  Coumadin (Warfarin)  Major Drug Interactions  Increased Warfarin Effect Decreased Warfarin Effect  Alcohol (large quantities) Antibiotics (esp. Septra/Bactrim, Flagyl, Cipro) Amiodarone (Cordarone) Aspirin (ASA) Cimetidine (Tagamet) Megestrol (Megace) NSAIDs (ibuprofen, naproxen, etc.) Piroxicam  (Feldene) Propafenone (Rythmol SR) Propranolol (Inderal) Isoniazid (INH) Posaconazole (Noxafil) Barbiturates (Phenobarbital) Carbamazepine (Tegretol) Chlordiazepoxide (Librium) Cholestyramine (Questran) Griseofulvin Oral Contraceptives Rifampin Sucralfate (Carafate) Vitamin K   Coumadin (Warfarin) Major Herbal Interactions  Increased Warfarin Effect Decreased Warfarin Effect  Garlic Ginseng Ginkgo biloba Coenzyme Q10 Green tea St. Johns wort    Coumadin (Warfarin) FOOD Interactions  Eat a consistent number of servings per week of foods HIGH in Vitamin K (1 serving =  cup)  Collards (cooked, or boiled & drained) Kale (cooked, or boiled & drained) Mustard greens (cooked, or boiled & drained) Parsley *serving size only =  cup Spinach (cooked, or boiled & drained) Swiss chard (cooked, or boiled & drained) Turnip greens (cooked, or boiled & drained)  Eat a consistent number of servings per week of foods MEDIUM-HIGH in Vitamin K (1 serving = 1 cup)  Asparagus (cooked, or boiled & drained) Broccoli (cooked, boiled & drained, or raw & chopped) Brussel sprouts (cooked, or boiled & drained) *serving size only =  cup Lettuce, raw (green leaf, endive, romaine) Spinach, raw Turnip greens, raw & chopped   These websites have more information on Coumadin (warfarin):  FailFactory.se; VeganReport.com.au;

## 2016-01-12 NOTE — Anesthesia Procedure Notes (Signed)
Procedure Name: MAC Date/Time: 01/12/2016 7:40 AM Performed by: Kyung Rudd Pre-anesthesia Checklist: Patient identified, Emergency Drugs available, Suction available and Patient being monitored Patient Re-evaluated:Patient Re-evaluated prior to inductionOxygen Delivery Method: Simple face mask Intubation Type: IV induction Placement Confirmation: positive ETCO2

## 2016-01-12 NOTE — Anesthesia Procedure Notes (Signed)
Spinal  Patient location during procedure: OR Start time: 01/12/2016 7:35 AM End time: 01/12/2016 7:37 AM Staffing Anesthesiologist: Catalina Gravel Performed: anesthesiologist  Preanesthetic Checklist Completed: patient identified, surgical consent, pre-op evaluation, timeout performed, IV checked, risks and benefits discussed and monitors and equipment checked Spinal Block Patient position: sitting Prep: site prepped and draped and DuraPrep Patient monitoring: continuous pulse ox and blood pressure Approach: midline Location: L3-4 Injection technique: single-shot Needle Needle type: Pencan  Needle gauge: 25 G Needle length: 9 cm Assessment Sensory level: T8 Additional Notes Functioning IV was confirmed and monitors were applied. Sterile prep and drape, including hand hygiene, mask and sterile gloves were used. The patient was positioned and the spine was prepped. The skin was anesthetized with lidocaine.  Free flow of clear CSF was obtained prior to injecting local anesthetic into the CSF.  The spinal needle aspirated freely following injection.  The needle was carefully withdrawn.  The patient tolerated the procedure well. Consent was obtained prior to procedure with all questions answered and concerns addressed. Risks including but not limited to bleeding, infection, nerve damage, paralysis, failed block, inadequate analgesia, allergic reaction, high spinal, itching and headache were discussed and the patient wished to proceed.   Hoy Morn, MD

## 2016-01-12 NOTE — Anesthesia Postprocedure Evaluation (Signed)
Anesthesia Post Note  Patient: [name]  Procedure(s) Performed: Procedure(s) (LRB): RIGHT TOTAL KNEE ARTHROPLASTY (Right)  Patient location during evaluation: PACU Anesthesia Type: Spinal, Regional and MAC Level of consciousness: oriented and awake and alert Pain management: pain level controlled Vital Signs Assessment: post-procedure vital signs reviewed and stable Respiratory status: spontaneous breathing, respiratory function stable and patient connected to nasal cannula oxygen Cardiovascular status: blood pressure returned to baseline and stable Postop Assessment: no headache, no backache, spinal receding, patient able to bend at knees and no signs of nausea or vomiting Anesthetic complications: no       Last Vitals:  Vitals:   01/12/16 1200 01/12/16 1220  BP:  (!) 147/75  Pulse: 82 76  Resp: 13 16  Temp:  36.7 C    Last Pain:  Vitals:   01/12/16 0938  TempSrc:   PainSc: Asleep                 Catalina Gravel

## 2016-01-12 NOTE — Op Note (Signed)
NAMEMARGARUITE, [name]             ACCOUNT NO.:  [id number]  MEDICAL RECORD NO.:  [id number]  LOCATION:  5N10C                        FACILITY:  [facility]  PHYSICIAN:  Doran Heater. Veverly Fells, M.D. DATE OF BIRTH:  1950/03/23  DATE OF PROCEDURE:  01/12/2016 DATE OF DISCHARGE:                              OPERATIVE REPORT   PREOPERATIVE DIAGNOSIS:  Right knee end-stage osteoarthritis.  POSTOPERATIVE DIAGNOSIS:  Right knee end-stage osteoarthritis.  PROCEDURE PERFORMED:  Right total knee replacement using DePuy Sigma rotating platform prosthesis.  ATTENDING SURGEON:  Doran Heater. Veverly Fells, MD.  ASSISTANT:  [name] Bruns, PA-C, who scrubbed in the entire procedure and necessary for satisfactory completion of surgery.  ANESTHESIA:  Spinal anesthesia was used plus femoral nerve block.  ESTIMATED BLOOD LOSS:  Minimal.  FLUID REPLACEMENT:  1200 mL crystalloid.  INSTRUMENT COUNTS:  Correct.  COMPLICATIONS:  There were no complications.  TOURNIQUET TIME:  91 minutes.  INDICATIONS:  The patient is a 65 year old female with a history of worsening right knee pain secondary to end-stage arthritis.  The patient has bone-on-bone changes on x-ray.  The patient unable to ambulate safely without assisting device.  The patient having to rest frequently. The patient does have night pain and rest pain.  Having failed all measures of conservative management, the patient presents for operative total knee arthroplasty to restore function and eliminate pain to her knee.  Informed consent obtained.  DESCRIPTION OF PROCEDURE:  After an adequate level of anesthesia was achieved, the patient was positioned in the supine position.  The right leg was correctly identified, and a nonsterile tourniquet placed in right proximal thigh.  Right leg sterilely prepped and draped in usual manner.  Time-out was called.  After a sterile prep and drape and time- out, we elevated the leg and exsanguinated using Esmarch bandage.   We then went ahead and elevated the tourniquet to 350 mmHg.  We placed the knee in flexion.  A longitudinal midline incision was created with a 10- blade scalpel.  Dissection down through subcutaneous tissues.  We identified the medial parapatellar tissues and performed a medial parapatellar arthrotomy.  We everted the patella, bilateral patellofemoral ligaments, entered the distal femur with a step-cut drill, placed intramedullary guide and resected 10 mm off the distal femur with 5-degree right valgus setting.  Once we did that, we sized the femur to a size 3 anterior down.  We then performed anterior, posterior and chamfer cuts with a 4-in-1 block.  We removed ACL, PCL, and meniscal tissue, subluxed the tibia anteriorly, and then went ahead and cut the tibia to 90 degrees perpendicular to the long axis of the tibia with minimal posterior slope and posterior cruciate substituting prosthesis.  Once we had our tibial cut made, we checked our flexion- extension blocks which were symmetric at 10 mm.  We did resect the posterior osteophytes off the posterior femoral condyles with the osteotome.  We then completed our tibial preparation with a modular drill and keel punch for the size 3 tibia.  We had our trial placed.  We then went ahead and did our box cut for the femur.  Once we had our box cut down with an oscillating saw, we  placed our size 3 right femur and impacted that in place with a 10 poly spacer and reduced the knee.  We had good stability, and we were able to get full extension.  We then went ahead and resurfaced the patella.  It was 22 mm thickness patella. We went down to a size 14 with the patellar clamp that we used to resect the patella.  Once we were able to get a nice flush cut there, we drilled our lug holes for our 32 patellar button, placed the patellar button in place and had a nice stability with no-touch technique.  We removed all trial components, pulse irrigated  the bone, and then cemented the metal components into place with DePuy high-viscosity cement using vacuum mixing, and once we had the components cemented into place, we placed the knee in full extension with a 10 poly spacer, allowed the cement to harden and removed excess cement.  We used a quarter-inch curved osteotome.  We then did our final trialing with a size 10 poly which we were happy with.  We were able to obtain full extension and we were nice and stable both in flexion and extension.  We removed the trial poly, inserted the real size 10 trial poly, and then reduced the knee with a nice pop on the medial side.  At this point, we irrigated thoroughly, repaired the medial parapatellar arthrotomy with 0 Vicryl suture.  We used IV TXA pre-surgery that went in before the case started, and then we did topical TXA during our closure of the parapatellar arthrotomy.  We placed that intra-articularly and allowed that to be in while we were doing our closure.  Once we had that closure done, we ranged the knee and again had excellent patellar tracking.  No binding, excellent motion.  We then repaired the subcu with 2-0 Vicryl and 4-0 Monocryl for skin, Steri-Strips applied followed by a sterile dressing.  The patient tolerated the surgery well.     Doran Heater. Veverly Fells, M.D.     SRN/MEDQ  D:  01/12/2016  T:  01/12/2016  Job:  [id number]

## 2016-01-12 NOTE — Progress Notes (Signed)
Orthopedic Tech Progress Note Patient Details:  [name] 1950/06/26 [id number]  CPM Right Knee CPM Right Knee: On Right Knee Flexion (Degrees): 90 Right Knee Extension (Degrees): 0 Additional Comments: trapeze bar patient helper   Hildred Priest 01/12/2016, 10:16 AM Viewed order from doctor's order list

## 2016-01-12 NOTE — Transfer of Care (Signed)
Immediate Anesthesia Transfer of Care Note  Patient: [name]  Procedure(s) Performed: Procedure(s): RIGHT TOTAL KNEE ARTHROPLASTY (Right)  Patient Location: PACU  Anesthesia Type:Spinal  Level of Consciousness: awake, alert  and oriented  Airway & Oxygen Therapy: Patient Spontanous Breathing and Patient connected to face mask oxygen  Post-op Assessment: Report given to RN and Post -op Vital signs reviewed and stable  Post vital signs: Reviewed and stable  Last Vitals:  Vitals:   01/12/16 0604  BP: 140/77  Pulse: 86  Resp: 20  Temp: 36.7 C    Last Pain:  Vitals:   01/12/16 0604  TempSrc: Oral         Complications: No apparent anesthesia complications

## 2016-01-12 NOTE — Brief Op Note (Addendum)
01/12/2016  9:40 AM  PATIENT:  [name]  65 y.o. female  PRE-OPERATIVE DIAGNOSIS:  RIGHT KNEE END STAGE OSTEOARTHRITIS  POST-OPERATIVE DIAGNOSIS:  RIGHT KNEE END STAGE OSTEOARTHRITIS  PROCEDURE:  Procedure(s): RIGHT TOTAL KNEE ARTHROPLASTY (Right) DePuy Sigma RP  SURGEON:  Surgeon(s) and Role:    * Netta Cedars, MD - Primary  PHYSICIAN ASSISTANT:   ASSISTANTS: Ventura Bruns, PA-C   ANESTHESIA:  Regional(femoral block) and spinal  EBL:  Total I/O In: 700 [I.V.:700] Out: 300 [Urine:250; Blood:50]  BLOOD ADMINISTERED:none  DRAINS: none   LOCAL MEDICATIONS USED:  NONE    SPECIMEN:  No Specimen  DISPOSITION OF SPECIMEN:  N/A  COUNTS:  YES  TOURNIQUET:   Total Tourniquet Time Documented: Thigh (Right) - 91 minutes Total: Thigh (Right) - 91 minutes   DICTATION: [facility] Dictation # [id number]  PLAN OF CARE: Admit to inpatient   PATIENT DISPOSITION:  PACU - hemodynamically stable.   Delay start of Pharmacological VTE agent (>24hrs) due to surgical blood loss or risk of bleeding: no

## 2016-01-12 NOTE — Interval H&P Note (Signed)
History and Physical Interval Note:  01/12/2016 7:26 AM  [name]  has presented today for surgery, with the diagnosis of RIGHT KNEE INSTAGE OSTEOARTHRITIS  The various methods of treatment have been discussed with the patient and family. After consideration of risks, benefits and other options for treatment, the patient has consented to  Procedure(s): TOTAL KNEE ARTHROPLASTY (Right) as a surgical intervention .  The patient's history has been reviewed, patient examined, no change in status, stable for surgery.  I have reviewed the patient's chart and labs.  Questions were answered to the patient's satisfaction.     Shloimy Michalski,STEVEN R

## 2016-01-13 LAB — CBC
HEMATOCRIT: 35.9 % — AB (ref 36.0–46.0)
Hemoglobin: 11.5 g/dL — ABNORMAL LOW (ref 12.0–15.0)
MCH: 27.2 pg (ref 26.0–34.0)
MCHC: 32 g/dL (ref 30.0–36.0)
MCV: 84.9 fL (ref 78.0–100.0)
PLATELETS: 175 10*3/uL (ref 150–400)
RBC: 4.23 MIL/uL (ref 3.87–5.11)
RDW: 15.3 % (ref 11.5–15.5)
WBC: 7.4 10*3/uL (ref 4.0–10.5)

## 2016-01-13 LAB — BASIC METABOLIC PANEL
ANION GAP: 8 (ref 5–15)
BUN: 9 mg/dL (ref 6–20)
CALCIUM: 8.4 mg/dL — AB (ref 8.9–10.3)
CO2: 26 mmol/L (ref 22–32)
Chloride: 102 mmol/L (ref 101–111)
Creatinine, Ser: 0.67 mg/dL (ref 0.44–1.00)
Glucose, Bld: 122 mg/dL — ABNORMAL HIGH (ref 65–99)
Potassium: 3.8 mmol/L (ref 3.5–5.1)
SODIUM: 136 mmol/L (ref 135–145)

## 2016-01-13 LAB — PROTIME-INR
INR: 1.13
PROTHROMBIN TIME: 14.6 s (ref 11.4–15.2)

## 2016-01-13 MED ORDER — CLONIDINE HCL 0.1 MG PO TABS
0.1000 mg | ORAL_TABLET | Freq: Three times a day (TID) | ORAL | Status: DC | PRN
  Administered 2016-01-13 – 2016-01-14 (×2): 0.1 mg via ORAL
  Filled 2016-01-13 (×2): qty 1

## 2016-01-13 MED ORDER — CLONIDINE HCL 0.1 MG PO TABS: 0.1000 mg | ORAL_TABLET | Freq: Three times a day (TID) | ORAL | Status: DC | PRN

## 2016-01-13 MED ORDER — WARFARIN SODIUM 5 MG PO TABS
5.0000 mg | ORAL_TABLET | Freq: Once | ORAL | Status: AC
  Administered 2016-01-13: 5 mg via ORAL
  Filled 2016-01-13: qty 1

## 2016-01-13 NOTE — Progress Notes (Signed)
PT Cancellation Note  Patient Details Name: [name] MRN: [id number] DOB: 28-Sep-1950   Cancelled Treatment:    Reason Eval/Treat Not Completed: Fatigue/lethargy limiting ability to participate;Patient's level of consciousness. Pt is falling asleep when talking to therapist. Pt not able to participate this session. Will reattempt visits tomorrow.    Scheryl Marten PT, DPT  [phone]  01/13/2016, 3:13 PM

## 2016-01-13 NOTE — Progress Notes (Signed)
Physical Therapy Treatment Patient Details Name: [name] MRN: [id number] DOB: Feb 19, 1950 Today's Date: 01/13/2016    History of Present Illness 65 yo female admitted to [facility] on 01/12/16 for Right TKA due to severe R knee OA. PMH significant for CA, HTN, liver disease, siezures, L THA.    PT Comments    Pt is POD 1 and moving better with therapy but continues to be limited by pain. Performed short distance gait in room to bathroom requiring assistance on right side to assist with weight bearing through RLE. Pt is very lethargic this session and has not eaten any breakfast or food since last AM. Pt's pain is not controlled which did limit her progression this session.    Follow Up Recommendations  Home health PT;Supervision for mobility/OOB     Equipment Recommendations  3in1 (PT);Other (comment) (CPM)    Recommendations for Other Services       Precautions / Restrictions Precautions Precautions: Knee Precaution Booklet Issued: Yes (comment) Precaution Comments: Handout was given, verbally reviewed. Will need reinforcement Required Braces or Orthoses: Knee Immobilizer - Right Knee Immobilizer - Right: On when out of bed or walking Restrictions Weight Bearing Restrictions: Yes RLE Weight Bearing: Weight bearing as tolerated    Mobility  Bed Mobility Overal bed mobility: Needs Assistance Bed Mobility: Supine to Sit     Supine to sit: Min assist;HOB elevated     General bed mobility comments: Min A to bring RLE EOB with use of railings to pull self to EOB  Transfers Overall transfer level: Needs assistance Equipment used: Rolling walker (2 wheeled) Transfers: Sit to/from Stand Sit to Stand: Min assist         General transfer comment: Min A for safety from EOB and cues for proper hand placement, min a from commode  Ambulation/Gait Ambulation/Gait assistance: Min assist Ambulation Distance (Feet): 30 Feet Assistive device: Rolling walker (2 wheeled) Gait  Pattern/deviations: Step-to pattern;Decreased step length - left;Decreased stance time - right;Decreased weight shift to right;Antalgic Gait velocity: decreased Gait velocity interpretation: Below normal speed for age/gender General Gait Details: Mod antalgic gait, requires assistance on Right side to assist with reducing weightbearing    Stairs            Wheelchair Mobility    Modified Rankin (Stroke Patients Only)       Balance Overall balance assessment: Needs assistance Sitting-balance support: Single extremity supported;Feet supported Sitting balance-Leahy Scale: Fair Sitting balance - Comments: Sitting EOB with no back support   Standing balance support: Bilateral upper extremity supported Standing balance-Leahy Scale: Poor Standing balance comment: relies on RW for stability in standing and assistance at trunk to stabilize                    Cognition Arousal/Alertness: Lethargic Behavior During Therapy: Restless;WFL for tasks assessed/performed Overall Cognitive Status: Within Functional Limits for tasks assessed                      Exercises Total Joint Exercises Ankle Circles/Pumps: AROM;Both;20 reps;Supine Quad Sets: AROM;Right;10 reps;Supine    General Comments        Pertinent Vitals/Pain Pain Assessment: 0-10 Pain Score: 8  Pain Location: Right knee and low back Pain Descriptors / Indicators: Cramping;Guarding;Moaning Pain Intervention(s): Monitored during session;Limited activity within patient's tolerance;Premedicated before session;Repositioned;Ice applied    Home Living                      Prior  Function            PT Goals (current goals can now be found in the care plan section) Acute Rehab PT Goals Patient Stated Goal: to get home as quickly as she can Progress towards PT goals: Progressing toward goals    Frequency    7X/week      PT Plan Current plan remains appropriate    Co-evaluation              End of Session Equipment Utilized During Treatment: Gait belt Activity Tolerance: Patient limited by pain;Patient limited by lethargy Patient left: in chair;with call bell/phone within reach     Time: 0814-0853 PT Time Calculation (min) (ACUTE ONLY): 39 min  Charges:  $Gait Training: 23-37 mins $Therapeutic Activity: 8-22 mins                    G Codes:      Scheryl Marten PT, DPT  [phone]  01/13/2016, 10:09 AM

## 2016-01-13 NOTE — Progress Notes (Signed)
ANTICOAGULATION CONSULT NOTE - Follow Up Consult  Pharmacy Consult for Coumadin Indication: VTE prophylaxis s/p TKA  Allergies  Allergen Reactions  . Bee Venom Anaphylaxis  . Codeine Hives  . Sulfonamide Derivatives     UNSPECIFIED REACTION OF CHILDHOOD     Patient Measurements:    Vital Signs: Temp: 98.8 F (37.1 C) (12/30 0525) Temp Source: Oral (12/30 0525) BP: 158/72 (12/30 0630) Pulse Rate: 95 (12/30 0630)  Labs:  Recent Labs  01/12/16 1256 01/13/16 0504  HGB 12.4 11.5*  HCT 38.7 35.9*  PLT 163 175  LABPROT  --  14.6  INR  --  1.13  CREATININE 0.64 0.67    Estimated Creatinine Clearance: 79.8 mL/min (by C-G formula based on SCr of 0.67 mg/dL).   Medications:  Coumadin '5mg'$  given on 12/29  Assessment: 65 year old female s/p TKA on Lovenox bridge to Coumadin for VTE prophylaxis. INR is 1.13 which is expected after only 1 dose of Coumadin. No bleeding noted.  Goal of Therapy:  INR 2-3 Monitor platelets by anticoagulation protocol: Yes   Plan:  Coumadin '5mg'$  today  Daily PT/INR  Legrand Como, Pharm.D., BCPS, AAHIVP Clinical Pharmacist Phone: [phone] Pager: [phone] 01/13/2016, 12:09 PM

## 2016-01-13 NOTE — Plan of Care (Signed)
Problem: Bowel/Gastric: Goal: Will not experience complications related to bowel motility Outcome: Progressing Denies any issues

## 2016-01-13 NOTE — Progress Notes (Signed)
Orthopedics Progress Note  Subjective: Patient reporting pain this morning that is controlled with meds  Objective:  Vitals:   01/13/16 0525 01/13/16 0630  BP: (!) 192/81 (!) 158/72  Pulse: 98 95  Resp: 16   Temp: 98.8 F (37.1 C)     General: Awake and alert  Musculoskeletal: right knee dressing CDI, able to do quad sets and push downs and ankle pumps without pain Neurovascularly intact  Lab Results  Component Value Date   WBC 7.4 01/13/2016   HGB 11.5 (L) 01/13/2016   HCT 35.9 (L) 01/13/2016   MCV 84.9 01/13/2016   PLT 175 01/13/2016       Component Value Date/Time   NA 136 01/13/2016 0504   K 3.8 01/13/2016 0504   K 3.5 03/19/2011 0722   CL 102 01/13/2016 0504   CO2 26 01/13/2016 0504   GLUCOSE 122 (H) 01/13/2016 0504   BUN 9 01/13/2016 0504   CREATININE 0.67 01/13/2016 0504   CALCIUM 8.4 (L) 01/13/2016 0504   GFRNONAA >60 01/13/2016 0504   GFRAA >60 01/13/2016 0504    Lab Results  Component Value Date   INR 1.13 01/13/2016   INR 1.02 08/24/2012    Assessment/Plan: POD #1 s/p Procedure(s): RIGHT TOTAL KNEE ARTHROPLASTY Stable overnight, nursing requesting CPAP for HS DVT prophylaxis with Coumadin/Lovenox and mechanical OOB with therapy PT, OT, D/C planning  Remo Lipps R. Veverly Fells, MD 01/13/2016 8:58 AM

## 2016-01-13 NOTE — Progress Notes (Signed)
OT Cancellation Note  Patient Details Name: [name] MRN: [id number] DOB: Sep 23, 1950   Cancelled Treatment:    Reason Eval/Treat Not Completed: Fatigue/lethargy limiting ability to participate. Pt unable to complete sentences when talking to therapist, unable to get Pt to a state to receive education/information even with all the lights on, and an attempt in repositioning. OT will attempt again tomorrow. Lethargy suspect to medications.   [place] 01/13/2016, 4:07 PM  Hulda Humphrey OTR/L [phone]

## 2016-01-14 LAB — CBC
HCT: 35.6 % — ABNORMAL LOW (ref 36.0–46.0)
Hemoglobin: 11.2 g/dL — ABNORMAL LOW (ref 12.0–15.0)
MCH: 27.2 pg (ref 26.0–34.0)
MCHC: 31.5 g/dL (ref 30.0–36.0)
MCV: 86.4 fL (ref 78.0–100.0)
PLATELETS: 172 10*3/uL (ref 150–400)
RBC: 4.12 MIL/uL (ref 3.87–5.11)
RDW: 15.6 % — AB (ref 11.5–15.5)
WBC: 6.6 10*3/uL (ref 4.0–10.5)

## 2016-01-14 LAB — PROTIME-INR
INR: 1.5
PROTHROMBIN TIME: 18.3 s — AB (ref 11.4–15.2)

## 2016-01-14 MED ORDER — WARFARIN SODIUM 5 MG PO TABS
5.0000 mg | ORAL_TABLET | Freq: Once | ORAL | Status: AC
  Administered 2016-01-14: 5 mg via ORAL
  Filled 2016-01-14: qty 1

## 2016-01-14 MED ORDER — HYDRALAZINE HCL 20 MG/ML IJ SOLN: 10.0000 mg | Freq: Four times a day (QID) | INTRAMUSCULAR | Status: DC | PRN

## 2016-01-14 NOTE — Progress Notes (Signed)
Physical Therapy Treatment Patient Details Name: [name] MRN: [id number] DOB: 01-20-1950 Today's Date: 01/14/2016    History of Present Illness 65 yo female admitted to [facility] on 01/12/16 for Right TKA due to severe R knee OA. PMH significant for CA, HTN, liver disease, siezures, L THA.    PT Comments    Pt presents POD 2 and is moving better with therapy. Improved tolerance for gait this session with increased weight bearing through RLE and improved upright posture. Pt is able to perform gait to commode and perform some standing activities with decreased assistance this session. Pt continues to benefit from further skilled care in order to address deficits before discharge.    Follow Up Recommendations  Home health PT;Supervision for mobility/OOB     Equipment Recommendations  3in1 (PT);Other (comment)    Recommendations for Other Services       Precautions / Restrictions Precautions Precautions: Knee Precaution Booklet Issued: Yes (comment) Precaution Comments: reviewed no pillow under knee with Pt Required Braces or Orthoses: Knee Immobilizer - Right Knee Immobilizer - Right: On when out of bed or walking Restrictions Weight Bearing Restrictions: Yes RLE Weight Bearing: Weight bearing as tolerated    Mobility  Bed Mobility Overal bed mobility: Needs Assistance Bed Mobility: Supine to Sit     Supine to sit: Min assist;HOB elevated     General bed mobility comments: Recieved with OT assisting pt to EOB  Transfers Overall transfer level: Needs assistance Equipment used: Rolling walker (2 wheeled) Transfers: Sit to/from Stand Sit to Stand: Min guard         General transfer comment: vc for safe hand placement   Ambulation/Gait Ambulation/Gait assistance: Min assist Ambulation Distance (Feet): 50 Feet (20x1, 30x1) Assistive device: Rolling walker (2 wheeled) Gait Pattern/deviations: Step-to pattern;Decreased step length - left;Decreased stance time -  right;Decreased weight shift to right;Antalgic Gait velocity: decreased Gait velocity interpretation: Below normal speed for age/gender General Gait Details: Mod antalgic gait, improved weight shift to right with gait   Stairs            Wheelchair Mobility    Modified Rankin (Stroke Patients Only)       Balance Overall balance assessment: Needs assistance Sitting-balance support: No upper extremity supported;Feet supported Sitting balance-Leahy Scale: Good Sitting balance - Comments: Sitting EOB with no back support   Standing balance support: No upper extremity supported;During functional activity Standing balance-Leahy Scale: Fair Standing balance comment: standing at sink for grooming, reliant on RW during ambulation                    Cognition Arousal/Alertness: Awake/alert Behavior During Therapy: WFL for tasks assessed/performed Overall Cognitive Status: Within Functional Limits for tasks assessed                 General Comments: Pt with improved alerness and ability to participate this session    Exercises Total Joint Exercises Ankle Circles/Pumps: AROM;Both;20 reps;Supine Quad Sets: AROM;Right;10 reps;Supine    General Comments        Pertinent Vitals/Pain Pain Assessment: 0-10 Pain Score: 6  Faces Pain Scale: Hurts little more Pain Location: R knee Pain Descriptors / Indicators: Burning;Cramping;Grimacing;Guarding;Sore;Tender Pain Intervention(s): Monitored during session;Repositioned;Ice applied    Home Living Family/patient expects to be discharged to:: Private residence Living Arrangements: Spouse/significant other Available Help at Discharge: Family;Available 24 hours/day;Home health Type of Home: House Home Access: Stairs to enter Entrance Stairs-Rails: None Home Layout: One level Home Equipment: Environmental consultant - 2 wheels;Shower seat -  built in;Grab bars - tub/shower      Prior Function Level of Independence: Independent       Comments: was driving and performing all ADL's and IADLs   PT Goals (current goals can now be found in the care plan section) Acute Rehab PT Goals Patient Stated Goal: to get home as quickly as she can Progress towards PT goals: Progressing toward goals    Frequency    7X/week      PT Plan Current plan remains appropriate    Co-evaluation   Reason for Co-Treatment: For patient/therapist safety PT goals addressed during session: Mobility/safety with mobility OT goals addressed during session: ADL's and self-care     End of Session Equipment Utilized During Treatment: Gait belt;Right knee immobilizer Activity Tolerance: Patient tolerated treatment well;Patient limited by fatigue Patient left: in chair;with call bell/phone within reach     Time: 0828-0854 PT Time Calculation (min) (ACUTE ONLY): 26 min  Charges:                       G Codes:      Scheryl Marten PT, DPT  [phone]  01/14/2016, 12:06 PM

## 2016-01-14 NOTE — Plan of Care (Signed)
Problem: Safety: Goal: Ability to remain free from injury will improve Outcome: Progressing Safety precautions maintained  Problem: Pain Managment: Goal: General experience of comfort will improve Outcome: Progressing Medicated once for pain this shift, resting in the bed with eyes closed at present time.  Problem: Skin Integrity: Goal: Risk for impaired skin integrity will decrease Outcome: Progressing No skin issues other than the incision to right knee  Problem: Tissue Perfusion: Goal: Risk factors for ineffective tissue perfusion will decrease Outcome: Progressing Denies S/S of DVT  Problem: Activity: Goal: Risk for activity intolerance will decrease Outcome: Progressing OOB to [facility] with one and minimal assistance, patient tolerated well  Problem: Bowel/Gastric: Goal: Will not experience complications related to bowel motility Outcome: Progressing No gastric or bowel issues noted

## 2016-01-14 NOTE — Consult Note (Signed)
Medical Consultation   [name]  OAC:[id number]  DOB: 04/14/1950  DOA: 01/12/2016  PCP: Delia Chimes, NP    Requesting physician: Dr. Veverly Fells, Ortho Reason for consultation: Elevated Blood pressure     History of Present Illness: [name] is an 65 y.o. female hypertension, hyperlipidemia, obesity, tobacco abuse, diabetes, admitted on 01/12/2016 for right total knee replacement due to end-stage or of the right knee. She tolerated the procedure well. However, her blood pressure since admission, has been elevated, for we each [facility] were asked to see the patient in consultation, to help in its management. Her BP meds were held  for the surgery on 12/30, restarting them thereafter and she is yet to have her morning meds at 10 AM. The patient reports not having have seen her PCP in more than 2 months. The patient admits not beingcompliant with her outpatient meds, but that prior to her admission, some of BP  medications have been changed. At this time, no notes are available for review regarding this issue, but she does say that these was done by Cardiology. She does not recall of what her baseline BP is.The patient denies any history of kidney disease. She denies any chest pain or palpitations. She denies any shortness of breath. She denies any nausea and vomiting at this time, but she does report taking prednisone for nausea at least 3-4 times a week. . She continues to smoke about one packet day. She denies any recreational drug use or alcohol. She reports being in significant degree of pain at the surgical site, which increases her anxiety level. She denies any lower extremity swelling either than at the surgical site. She denies any headaches or vision changes. She does have a history of seizures, but not recently. No confusion is reported.    Review of Systems:  As per HPI otherwise 10 point review of systems negative.     Past Medical  History: Past Medical History:  Diagnosis Date  . Arthritis   . Cancer (HCC)    cervical  . COPD (chronic obstructive pulmonary disease) (HCC)    wheezing  . GERD (gastroesophageal reflux disease)   . Hypertension   . Liver disease   . Pre-diabetes   . Seizures ([place])    no meds for 8 months  . Sleep apnea    does not use her cpap  . Stroke [facility]) 2015   no weakness  . 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 BACK SURGERY    . CESAREAN SECTION    . CHOLECYSTECTOMY    . HAND SURGERY Bilateral    carpal tunnel  . HEMORRHOID SURGERY    . KNEE SURGERY Left    arthroscopy x3  . NECK SURGERY    . OTHER SURGICAL HISTORY  2013   bladder stimulator in back  . TENNIS ELBOW RELEASE/NIRSCHEL PROCEDURE Right 12/2015  . TOE SURGERY Right    right foot second and fourth toe  . TOTAL HIP ARTHROPLASTY Left   . TUBAL LIGATION    . VAGINAL HYSTERECTOMY       Allergies:   Allergies  Allergen Reactions  . Bee Venom Anaphylaxis  . Codeine Hives  . Sulfonamide Derivatives     UNSPECIFIED REACTION OF CHILDHOOD      Social History: Social History   Social History  .  Marital status: Married    Spouse name: N/A  . Number of children: N/A  . Years of education: N/A   Occupational History  . Not on file.   Social History Main Topics  . Smoking status: Current Every Day Smoker    Packs/day: 1.50    Years: 53.00  . Smokeless tobacco: Never Used     Comment: DOWN TO 1.5PACK DAY  . Alcohol use No  . Drug use: No  . Sexual activity: No   Other Topics Concern  . Not on file   Social History Narrative  . No narrative on file       Family History: Family History  Problem Relation Age of Onset  . Hyperlipidemia Mother   . Hypertension Mother   . Hyperlipidemia Father   . Hypertension Father   . Alzheimer's disease Father   . Stroke Brother   . Cancer Daughter     unknown    Family  history reviewed and not pertinent    Physical Exam: Vitals:   01/13/16 1838 01/13/16 2038 01/13/16 2340 01/14/16 0508  BP: (!) 195/85 (!) 193/83 (!) 155/82 (!) 179/81  Pulse: (!) 109 99 92 96  Resp:  18  16  Temp:  99 F (37.2 C)  98.3 F (36.8 C)  TempSrc:  Oral  Oral  SpO2: 94% 95%  97%    Constitutional: Appears calm,  alert and awake, oriented x3, not in any acute distress. Eyes: PERLA, EOMI, irises appear normal, anicteric sclera,  ENMT: external ears and nose appear normal, normal hearing or hard of hearing.  Lips appears normal, oropharynx mucosa, tongue, posterior pharynx appear normal  Neck: neck appears normal, no masses, normal ROM, no thyromegaly, no JVD  CVS: S1-S2 clear, 2/6 harsh systolic  murmur rubs or gallops, no LE edema, normal pedal pulses  Respiratory: clear to auscultation bilaterally, no wheezing, rales or rhonchi. Respiratory effort normal. No accessory muscle use.  Abdomen: obese, soft nontender, nondistended, normal bowel sounds, no hepatosplenomegaly, no hernias  Musculoskeletal: no cyanosis, clubbing or edema noted bilaterally.  Joint/bones/muscle exam, strength, contractures or atrophy. Right knee covered with bandage. Neuro: Cranial nerves II-XII intact, strength, sensation, reflexes Psych: judgement and insight appear normal, somewhat anxious mood and affect, mental status Skin: no rashes or lesions or ulcers, no induration or nodules   Data reviewed:  I have personally reviewed following labs and imaging studies Labs:  CBC:  Recent Labs Lab 01/12/16 1256 01/13/16 0504 01/14/16 0338  WBC 8.2 7.4 6.6  HGB 12.4 11.5* 11.2*  HCT 38.7 35.9* 35.6*  MCV 86.6 84.9 86.4  PLT 163 175 629    Basic Metabolic Panel:  Recent Labs Lab 01/12/16 1256 01/13/16 0504  NA  --  136  K  --  3.8  CL  --  102  CO2  --  26  GLUCOSE  --  122*  BUN  --  9  CREATININE 0.64 0.67  CALCIUM  --  8.4*   GFR Estimated Creatinine Clearance: 79.8 mL/min (by  C-G formula based on SCr of 0.67 mg/dL). Liver Function Tests: No results for input(s): AST, ALT, ALKPHOS, BILITOT, PROT, ALBUMIN in the last 168 hours. No results for input(s): LIPASE, AMYLASE in the last 168 hours. No results for input(s): AMMONIA in the last 168 hours. Coagulation profile  Recent Labs Lab 01/13/16 0504 01/14/16 0338  INR 1.13 1.50    Cardiac Enzymes: No results for input(s): CKTOTAL, CKMB, CKMBINDEX, TROPONINI in the last 168 hours.  BNP: Invalid input(s): POCBNP CBG:  Recent Labs Lab 01/12/16 0608 01/12/16 0940  GLUCAP 110* 127*   D-Dimer No results for input(s): DDIMER in the last 72 hours. Hgb A1c No results for input(s): HGBA1C in the last 72 hours. Lipid Profile No results for input(s): CHOL, HDL, LDLCALC, TRIG, CHOLHDL, LDLDIRECT in the last 72 hours. Thyroid function studies No results for input(s): TSH, T4TOTAL, T3FREE, THYROIDAB in the last 72 hours.  Invalid input(s): FREET3 Anemia work up No results for input(s): VITAMINB12, FOLATE, FERRITIN, TIBC, IRON, RETICCTPCT in the last 72 hours. Urinalysis    Component Value Date/Time   COLORURINE YELLOW 08/24/2012 1409   APPEARANCEUR CLEAR 08/24/2012 1409   LABSPEC 1.007 08/24/2012 1409   PHURINE 7.0 08/24/2012 1409   GLUCOSEU NEGATIVE 08/24/2012 1409   HGBUR NEGATIVE 08/24/2012 1409   BILIRUBINUR NEGATIVE 08/24/2012 1409   KETONESUR NEGATIVE 08/24/2012 1409   PROTEINUR NEGATIVE 08/24/2012 1409   UROBILINOGEN 1.0 08/24/2012 1409   NITRITE NEGATIVE 08/24/2012 1409   LEUKOCYTESUR NEGATIVE 08/24/2012 1409     Sepsis Labs Invalid input(s): PROCALCITONIN,  WBC,  LACTICIDVEN Microbiology No results found for this or any previous visit (from the past 240 hour(s)).     Inpatient Medications:   Scheduled Meds: . aspirin EC  81 mg Oral Daily  . docusate sodium  100 mg Oral BID  . DULoxetine  60 mg Oral Daily  . enoxaparin (LOVENOX) injection  30 mg Subcutaneous Q12H  . ferrous sulfate   325 mg Oral TID PC  . irbesartan  300 mg Oral Daily  . pantoprazole  40 mg Oral Daily  . rOPINIRole  1 mg Oral BID  . rosuvastatin  10 mg Oral q1800  . Warfarin - Pharmacist Dosing Inpatient   Does not apply q1800   Continuous Infusions: . sodium chloride 50 mL/hr at 01/12/16 1235     Radiological Exams on Admission: Dg Knee Right Port  Result Date: 01/12/2016 CLINICAL DATA:  65 year old female status post right knee replacement. Initial encounter. EXAM: PORTABLE RIGHT KNEE - 1-2 VIEW COMPARISON:  Right knee MRI 02/01/2003. FINDINGS: Portable AP and cross-table lateral views of the right knee. Total knee arthroplasty hardware is in place and appears intact. Postoperative changes to the surrounding soft tissues including intra-articular and subcutaneous gas. No unexpected osseous changes. IMPRESSION: Right total knee arthroplasty with no adverse features. Electronically Signed   By: Genevie Ann M.D.   On: 01/12/2016 10:42    Impression/Recommendations Active Problems:   Essential hypertension   H/O total knee replacement, right  Hypertension, persistently high in the setting of medicine non compliance, tobacco, uncontrolled R knee pain, recent steroid use, anxiety . She received clonidine prn without significant improvement of symptoms. Currently her BP is 179/81, but has not yet taken her morning dose. NA 136. Cr 0.67. Glu 122 Patient denies any history of renal disease. She reports "pre-diabetes" Will continue to monitor its values and will add hydralazine 10 mg IV  q 6 prn for BP 170/90 if BP does not decrease after her 10 am dose.  Continue home BP meds. If good control not achieved, we may consider dual therapy with AceI and HCTZ    Recommend tobacco cessation, and lifestyle modification to improve this BP  Pain control as per Orthopedics  Recommend workup for secondary hypertension as outpatient  Other medical issues as per primary team   Thank you for this consultation.  Our [facility]  hospitalist team will follow her BP closely.    [facility]  E PA-C [facility] 01/14/2016, 9:38 AM

## 2016-01-14 NOTE — Progress Notes (Signed)
Orthopedic Tech Progress Note Patient Details:  [name] 12/24/1950 [id number]  Patient ID: [name], female   DOB: 08-24-1950, 65 y.o.   MRN: [id number] Applied cpm 0-30. This is all the pt could handle.  [name] 01/14/2016, 6:20 AM

## 2016-01-14 NOTE — Evaluation (Signed)
Occupational Therapy Evaluation and Discharge Patient Details Name: [name] MRN: [id number] DOB: 05-15-1950 Today's Date: 01/14/2016    History of Present Illness 65 yo female admitted to [facility] on 01/12/16 for Right TKA due to severe R knee OA. PMH significant for CA, HTN, liver disease, siezures, L THA.   Clinical Impression   PTA Pt independent in ADL and mobility. Pt currently mod A for LB ADL and set up for sitting ADL, min guard for mobility with RW. Pt fully assessed and education complete. Please see performance level below. Pt with no further questions or concerns, and OT to sign off at this time. Thank you for the referral.     Follow Up Recommendations  Supervision/Assistance - 24 hour;No OT follow up    Equipment Recommendations  3 in 1 bedside commode    Recommendations for Other Services       Precautions / Restrictions Precautions Precautions: Knee Precaution Booklet Issued: Yes (comment) Precaution Comments: reviewed no pillow under knee with Pt Required Braces or Orthoses: Knee Immobilizer - Right Knee Immobilizer - Right: On when out of bed or walking Restrictions Weight Bearing Restrictions: Yes RLE Weight Bearing: Weight bearing as tolerated      Mobility Bed Mobility Overal bed mobility: Needs Assistance Bed Mobility: Supine to Sit     Supine to sit: Min assist;HOB elevated     General bed mobility comments: Min A to bring RLE EOB with use of railings to pull self to EOB  Transfers Overall transfer level: Needs assistance Equipment used: Rolling walker (2 wheeled) Transfers: Sit to/from Stand Sit to Stand: Min guard         General transfer comment: vc for safe hand placement     Balance Overall balance assessment: Needs assistance Sitting-balance support: No upper extremity supported;Feet supported Sitting balance-Leahy Scale: Good Sitting balance - Comments: Sitting EOB with no back support   Standing balance support: No  upper extremity supported;During functional activity Standing balance-Leahy Scale: Fair Standing balance comment: standing at sink for grooming, reliant on RW during ambulation                            ADL Overall ADL's : Needs assistance/impaired Eating/Feeding: Set up;Sitting (encouraging Pt to eat; she did not eat any of her breakfast)   Grooming: Wash/dry hands;Wash/dry face;Oral care;Min guard;Standing Grooming Details (indicate cue type and reason): sink level      Lower Body Bathing: Minimal assistance;With caregiver independent assisting Lower Body Bathing Details (indicate cue type and reason): Pt states that she has a long handle sponge that she already uses Upper Body Dressing : Set up;Sitting   Lower Body Dressing: Moderate assistance;With caregiver independent assisting;Sit to/from stand   Toilet Transfer: Min guard;Comfort height toilet;Grab bars;RW   Writer and Hygiene: Supervision/safety;Sitting/lateral lean Toileting - Clothing Manipulation Details (indicate cue type and reason): Pt able to perform peri care without assist Tub/ Shower Transfer: Walk-in shower;Min guard;With caregiver independent assisting;Shower Technical sales engineer Details (indicate cue type and reason): educated on walking in with strong leg first for safety, and having caregiver present Functional mobility during ADLs: Min guard;Rolling walker;Cueing for sequencing General ADL Comments: Pt with improved alertness this session, very motivated to be independent     Vision Vision Assessment?: No apparent visual deficits   Perception     Praxis      Pertinent Vitals/Pain Pain Assessment: Faces Faces Pain Scale: Hurts little more  Pain Location: R knee Pain Descriptors / Indicators: Burning;Cramping;Grimacing;Guarding;Sore;Tender Pain Intervention(s): Monitored during session;Repositioned;Ice applied     Hand Dominance Right    Extremity/Trunk Assessment Upper Extremity Assessment Upper Extremity Assessment: Generalized weakness   Lower Extremity Assessment Lower Extremity Assessment: RLE deficits/detail RLE Deficits / Details: pt with normal post op pain and weakness.       Communication Communication Communication: No difficulties   Cognition Arousal/Alertness: Awake/alert Behavior During Therapy: WFL for tasks assessed/performed Overall Cognitive Status: Within Functional Limits for tasks assessed                 General Comments: Pt with much improved alertness this session. Pt joking and following commands with no problem   General Comments       Exercises       Shoulder Instructions      Home Living Family/patient expects to be discharged to:: Private residence Living Arrangements: Spouse/significant other Available Help at Discharge: Family;Available 24 hours/day;Home health Type of Home: House Home Access: Stairs to enter [employer] of Steps: 4 Entrance Stairs-Rails: None Home Layout: One level     Bathroom Shower/Tub: Walk-in shower;Door   [employer] Toilet: Standard Bathroom Accessibility: Yes How Accessible: Accessible via walker Home Equipment: Walker - 2 wheels;Shower seat - built in;Grab bars - tub/shower          Prior Functioning/Environment Level of Independence: Independent        Comments: was driving and performing all ADL's and IADLs        OT Problem List: Pain;Decreased strength;Decreased range of motion;Decreased activity tolerance;Impaired balance (sitting and/or standing);Decreased knowledge of use of DME or AE;Obesity   OT Treatment/Interventions:      OT Goals(Current goals can be found in the care plan section) Acute Rehab OT Goals Patient Stated Goal: to get home as quickly as she can OT Goal Formulation: With patient Time For Goal Achievement: 01/21/16 Potential to Achieve Goals: Good  OT Frequency:     Barriers to D/C:             Co-evaluation PT/OT/SLP Co-Evaluation/Treatment: Yes Reason for Co-Treatment: To address functional/ADL transfers;Necessary to address cognition/behavior during functional activity PT goals addressed during session: Mobility/safety with mobility OT goals addressed during session: ADL's and self-care      End of Session Equipment Utilized During Treatment: Gait belt;Rolling walker;Right knee immobilizer;Other (comment) (O2 levels stayed above 90, even on room air) CPM Right Knee CPM Right Knee: Off Right Knee Flexion (Degrees): 30 Right Knee Extension (Degrees): 0 Nurse Communication: Mobility status;Other (comment) (O2 levels)  Activity Tolerance: Patient tolerated treatment well Patient left: in chair;with call bell/phone within reach   Time: 0821-0857 OT Time Calculation (min): 36 min Charges:  OT General Charges $OT Visit: 1 Procedure OT Evaluation $OT Eval Moderate Complexity: 1 Procedure G-Codes:    Merri Ray Shaquana Buel 23-Jan-2016, 9:16 AM  Hulda Humphrey OTR/L [phone]

## 2016-01-14 NOTE — Progress Notes (Signed)
ANTICOAGULATION CONSULT NOTE - Follow Up Consult  Pharmacy Consult for Coumadin Indication: VTE prophylaxis s/p TKA  Allergies  Allergen Reactions  . Bee Venom Anaphylaxis  . Codeine Hives  . Sulfonamide Derivatives     UNSPECIFIED REACTION OF CHILDHOOD     Patient Measurements:    Vital Signs: Temp: 98.3 F (36.8 C) (12/31 0508) Temp Source: Oral (12/31 0508) BP: 179/81 (12/31 0508) Pulse Rate: 96 (12/31 0508)  Labs:  Recent Labs  01/12/16 1256 01/13/16 0504 01/14/16 0338  HGB 12.4 11.5* 11.2*  HCT 38.7 35.9* 35.6*  PLT 163 175 172  LABPROT  --  14.6 18.3*  INR  --  1.13 1.50  CREATININE 0.64 0.67  --     Estimated Creatinine Clearance: 79.8 mL/min (by C-G formula based on SCr of 0.67 mg/dL).   Medications:  Coumadin '5mg'$  given on 12/29  Assessment: 65 year old female s/p TKA on Lovenox bridge to Coumadin for VTE prophylaxis. INR is 1.13 which is expected after only 1 dose of Coumadin. No bleeding noted.  Goal of Therapy:  INR 2-3 Monitor platelets by anticoagulation protocol: Yes   Plan:  Coumadin '5mg'$  today  Daily PT/INR  Legrand Como, Pharm.D., BCPS, AAHIVP Clinical Pharmacist Phone: [phone] Pager: [phone] 01/14/2016, 11:23 AM

## 2016-01-14 NOTE — Progress Notes (Addendum)
Orthopedics Progress Note  Subjective: Patient reports ongoing pain in the leg. Nursing reporting elevated BP  Objective:  Vitals:   01/13/16 2340 01/14/16 0508  BP: (!) 155/82 (!) 179/81  Pulse: 92 96  Resp:  16  Temp:  98.3 F (36.8 C)    General: Awake and alert  Musculoskeletal: Right knee wound looks good with min leg swelling and no erythema Neurovascularly intact  Lab Results  Component Value Date   WBC 6.6 01/14/2016   HGB 11.2 (L) 01/14/2016   HCT 35.6 (L) 01/14/2016   MCV 86.4 01/14/2016   PLT 172 01/14/2016       Component Value Date/Time   NA 136 01/13/2016 0504   K 3.8 01/13/2016 0504   K 3.5 03/19/2011 0722   CL 102 01/13/2016 0504   CO2 26 01/13/2016 0504   GLUCOSE 122 (H) 01/13/2016 0504   BUN 9 01/13/2016 0504   CREATININE 0.67 01/13/2016 0504   CALCIUM 8.4 (L) 01/13/2016 0504   GFRNONAA >60 01/13/2016 0504   GFRAA >60 01/13/2016 0504    Lab Results  Component Value Date   INR 1.50 01/14/2016   INR 1.13 01/13/2016   INR 1.02 08/24/2012    Assessment/Plan: POD #2 s/p Procedure(s): RIGHT TOTAL KNEE ARTHROPLASTY Hypertension post op that is not responding well to meds.  Patient reports recent change in her BP meds per her primary MD Will ask [facility] to help [place] manage BP Continue OOB/mobilization DVT prophylaxis - mechanical and Coumadin, Lovenox bridge Patient refused CPAP last night- I encouraged her to use this and explained the importance She is still requiring 4 liters of O2 - requires frequent reminders for deep breaths  Doran Heater. Veverly Fells, MD 01/14/2016 7:48 AM

## 2016-01-14 NOTE — Progress Notes (Signed)
Physical Therapy Treatment Patient Details Name: [name] MRN: [id number] DOB: [birth year] Today's Date: 01/14/2016    History of Present Illness 65 yo female admitted to [facility] on 01/12/16 for Right TKA due to severe R knee OA. PMH significant for CA, HTN, liver disease, siezures, L THA.    PT Comments    Pt is more lethargic this afternoon. Pt does not want to get OOB, but is agreeable to supine exercises. Pt was up to the bathroom earlier this PM with her husband and then returned to bed. Pt continues to require increased assistance to perform bed mobs, transfers and gait. Pt will need to do stairs before discharging home.    Follow Up Recommendations  Home health PT;Supervision for mobility/OOB     Equipment Recommendations  3in1 (PT)    Recommendations for Other Services       Precautions / Restrictions Precautions Precautions: Knee Precaution Booklet Issued: Yes (comment) Precaution Comments: reviewed no pillow under knee with Pt Required Braces or Orthoses: Knee Immobilizer - Right Knee Immobilizer - Right: On when out of bed or walking Restrictions Weight Bearing Restrictions: Yes RLE Weight Bearing: Weight bearing as tolerated    Mobility  Bed Mobility               General bed mobility comments: Recieved with OT assisting pt to EOB  Transfers Overall transfer level: Needs assistance Equipment used: Rolling walker (2 wheeled) Transfers: Sit to/from Stand Sit to Stand: Min guard         General transfer comment: vc for safe hand placement   Ambulation/Gait Ambulation/Gait assistance: Min assist Ambulation Distance (Feet): 50 Feet (20x1, 30x1) Assistive device: Rolling walker (2 wheeled) Gait Pattern/deviations: Step-to pattern;Decreased step length - left;Decreased stance time - right;Decreased weight shift to right;Antalgic Gait velocity: decreased Gait velocity interpretation: Below normal speed for age/gender General Gait Details: Mod  antalgic gait, improved weight shift to right with gait   Stairs            Wheelchair Mobility    Modified Rankin (Stroke Patients Only)       Balance                                    Cognition Arousal/Alertness: Lethargic Behavior During Therapy: WFL for tasks assessed/performed Overall Cognitive Status: Within Functional Limits for tasks assessed                 General Comments: Pt with improved alerness and ability to participate this session    Exercises Total Joint Exercises Ankle Circles/Pumps: AROM;Both;20 reps;Supine Quad Sets: AROM;Right;10 reps;Supine Heel Slides: AROM;Right;10 reps;Supine Goniometric ROM: 5-40    General Comments General comments (skin integrity, edema, etc.): Pt refusing getting OOB, but is agreeable to exercises and being set up in CPM.       Pertinent Vitals/Pain Pain Assessment: 0-10 Pain Score: 6  Pain Location: R knee Pain Descriptors / Indicators: Burning;Cramping;Grimacing;Guarding;Sore;Tender Pain Intervention(s): Monitored during session;Limited activity within patient's tolerance;Repositioned;Ice applied    Home Living                      Prior Function            PT Goals (current goals can now be found in the care plan section) Acute Rehab PT Goals Patient Stated Goal: to get home as quickly as she can Progress towards PT goals: Progressing toward  goals    Frequency    7X/week      PT Plan Current plan remains appropriate    Co-evaluation   Reason for Co-Treatment: For patient/therapist safety PT goals addressed during session: Mobility/safety with mobility       End of Session Equipment Utilized During Treatment: Right knee immobilizer Activity Tolerance: Patient limited by fatigue Patient left: in bed;in CPM;with call bell/phone within reach;with family/visitor present;with SCD's reapplied     Time: 1424-1450 PT Time Calculation (min) (ACUTE ONLY): 26  min  Charges:  $Therapeutic Exercise: 8-22 mins $Therapeutic Activity: 8-22 mins                    G Codes:      Scheryl Marten PT, DPT  [phone]  01/14/2016, 3:13 PM

## 2016-01-15 ENCOUNTER — Encounter (HOSPITAL_COMMUNITY): Payer: Self-pay

## 2016-01-15 DIAGNOSIS — I10 Essential (primary) hypertension: Secondary | ICD-10-CM

## 2016-01-15 LAB — CBC
HCT: 33.5 % — ABNORMAL LOW (ref 36.0–46.0)
Hemoglobin: 10.9 g/dL — ABNORMAL LOW (ref 12.0–15.0)
MCH: 27.6 pg (ref 26.0–34.0)
MCHC: 32.5 g/dL (ref 30.0–36.0)
MCV: 84.8 fL (ref 78.0–100.0)
PLATELETS: 189 10*3/uL (ref 150–400)
RBC: 3.95 MIL/uL (ref 3.87–5.11)
RDW: 15.3 % (ref 11.5–15.5)
WBC: 6.3 10*3/uL (ref 4.0–10.5)

## 2016-01-15 LAB — PROTIME-INR
INR: 2.17
Prothrombin Time: 24.6 seconds — ABNORMAL HIGH (ref 11.4–15.2)

## 2016-01-15 MED ORDER — WARFARIN SODIUM 5 MG PO TABS: 2.5000 mg | ORAL_TABLET | Freq: Every day | ORAL | 0 refills | Status: DC

## 2016-01-15 MED ORDER — WARFARIN SODIUM 5 MG PO TABS
2.5000 mg | ORAL_TABLET | Freq: Once | ORAL | Status: DC
Start: 2016-01-15 — End: 2016-01-15

## 2016-01-15 NOTE — Progress Notes (Signed)
Pt discharged from unit via wheelchair to pvt auto with family escorting. All belongings with pt. No distress noted. All discharge instructions and rx reviewed with pt.  Pt to be followed by home health and aware to schedule f/u appt with physician

## 2016-01-15 NOTE — Progress Notes (Addendum)
Orthopedic Tech Progress Note Patient Details:  [name] 12/07/1950 [id number]  Patient ID: [name], female   DOB: 1950-11-30, 66 y.o.   MRN: [id number] Pt wants to wait till after breakfast to get in cpm.  Karolee Stamps 01/15/2016, 6:19 AM

## 2016-01-15 NOTE — Progress Notes (Signed)
Pt readying for discharge to home. Home Scripts reviewed. Dr. Rolena Infante notified via page that signature was needed on rx for coumadin 5 mg tab. Take 2.5 mg by mouth daily.   Previous coumadin discharge rx prepared that was for '5mg'$  daily.  Pt informed to please wait for signature from MD.

## 2016-01-15 NOTE — Progress Notes (Signed)
Pt seen and examined at the bedside.  TRH consulted for BP control. BP controlled with avapro. Stable for discharge from medical standpoint.  Please call if any questions Thank you for consult  Leisa Lenz [facility] [phone]

## 2016-01-15 NOTE — Progress Notes (Signed)
ANTICOAGULATION CONSULT NOTE - Follow Up Consult  Pharmacy Consult for Coumadin Indication: VTE prophylaxis s/p TKA  Allergies  Allergen Reactions  . Bee Venom Anaphylaxis  . Codeine Hives  . Sulfonamide Derivatives     UNSPECIFIED REACTION OF CHILDHOOD     Patient Measurements:    Vital Signs: Temp: 97.9 F (36.6 C) (01/01 0500) Temp Source: Oral (01/01 0500) BP: 127/65 (01/01 0500) Pulse Rate: 91 (01/01 0500)  Labs:  Recent Labs  01/12/16 1256 01/13/16 0504 01/14/16 0338 01/15/16 0600  HGB 12.4 11.5* 11.2* 10.9*  HCT 38.7 35.9* 35.6* 33.5*  PLT 163 175 172 189  LABPROT  --  14.6 18.3* 24.6*  INR  --  1.13 1.50 2.17  CREATININE 0.64 0.67  --   --     Estimated Creatinine Clearance: 79.8 mL/min (by C-G formula based on SCr of 0.67 mg/dL).   Medications:   Assessment: 24 YOF s/p TKA on Lovenox bridge to Coumadin for VTE prophylaxis.   INR today is therapeutic (INR 2.17 << 1.5, goal of 2-3). Hgb/Hct slight drop, plts wnl. No overt s/sx of bleeding noted. Will d/c Enox bridge.   Goal of Therapy:  INR 2-3 Monitor platelets by anticoagulation protocol: Yes   Plan:  1. Discontinue Enoxaparin bridge as INR>2 2. Warfarin 2.5 mg x 1 dose at 1800 today 3. At discharge - would recommend 2.5 mg/day given trends this admission 4. Will plan to educate prior to discharge 5. Will continue to monitor for any signs/symptoms of bleeding and will follow up with PT/INR in the a.m.   Thank you for allowing pharmacy to be a part of this patient's care.  Alycia Rossetti, PharmD, BCPS Clinical Pharmacist Pager: [phone] Clinical phone for 01/15/2016 from 7a-3:30p: [phone] If after 3:30p, please call main pharmacy at: [phone] 01/15/2016 10:46 AM

## 2016-01-15 NOTE — Progress Notes (Signed)
Physical Therapy Treatment Patient Details Name: [name] MRN: [id number] DOB: 08/25/1950 Today's Date: 01/15/2016    History of Present Illness 66 yo female admitted to [facility] on 01/12/16 for Right TKA due to severe R knee OA. PMH significant for CA, HTN, liver disease, siezures, L THA.    PT Comments    Pt performed increased gait and reviewed stair training in prep for d/c home.  HEP issued but did not review.  Pt reports fatigue after gait and stairs and refused ex.    Follow Up Recommendations  Home health PT;Supervision for mobility/OOB     Equipment Recommendations  3in1 (PT)    Recommendations for Other Services       Precautions / Restrictions Precautions Precautions: Knee Precaution Booklet Issued: Yes (comment) Precaution Comments: reviewed no pillow under knee with Pt Required Braces or Orthoses: Knee Immobilizer - Right (did not use during session) Knee Immobilizer - Right: On when out of bed or walking Restrictions Weight Bearing Restrictions: No RLE Weight Bearing: Weight bearing as tolerated    Mobility  Bed Mobility               General bed mobility comments: Pt received   Transfers Overall transfer level: Needs assistance Equipment used: Rolling walker (2 wheeled) Transfers: Sit to/from Stand Sit to Stand: Min guard         General transfer comment: vc for safe hand placement   Ambulation/Gait Ambulation/Gait assistance: Min guard;Supervision Ambulation Distance (Feet): 120 Feet Assistive device: Rolling walker (2 wheeled) Gait Pattern/deviations: Step-to pattern;Decreased stride length;Trunk flexed Gait velocity: decreased Gait velocity interpretation: Below normal speed for age/gender General Gait Details: Cues for sequencing and RW safety.  Cues for upper trunk control.     Stairs Stairs: Yes   Stair Management: No rails;Backwards;With walker Number of Stairs: 2 General stair comments: Cues for sequencing and RW  placement.    Wheelchair Mobility    Modified Rankin (Stroke Patients Only)       Balance Overall balance assessment: Needs assistance   Sitting balance-Leahy Scale: Good       Standing balance-Leahy Scale: Fair                      Cognition Arousal/Alertness: Awake/alert Behavior During Therapy: WFL for tasks assessed/performed Overall Cognitive Status: Within Functional Limits for tasks assessed                      Exercises Total Joint Exercises Goniometric ROM: grossly 5-70 degrees.   Other Exercises Other Exercises: issued HEP but patient refused participation in HEP.      General Comments        Pertinent Vitals/Pain Pain Assessment: 0-10 Faces Pain Scale: Hurts little more Pain Location: R knee Pain Descriptors / Indicators: Burning;Cramping;Grimacing;Guarding;Sore;Tender Pain Intervention(s): Monitored during session;Limited activity within patient's tolerance;Repositioned;Ice applied    Home Living                      Prior Function            PT Goals (current goals can now be found in the care plan section) Acute Rehab PT Goals Patient Stated Goal: to get home as quickly as she can Potential to Achieve Goals: Good Progress towards PT goals: Progressing toward goals    Frequency    7X/week      PT Plan Current plan remains appropriate    Co-evaluation  End of Session Equipment Utilized During Treatment: Gait belt Activity Tolerance: Patient limited by fatigue Patient left: with call bell/phone within reach;in chair     Time: 1011-1044 PT Time Calculation (min) (ACUTE ONLY): 33 min  Charges:  $Gait Training: 8-22 mins $Therapeutic Activity: 8-22 mins                    G Codes:      Cristela Blue 2016/01/18, 10:51 AM  Governor Rooks, PTA pager [phone]

## 2016-01-15 NOTE — Progress Notes (Signed)
    Subjective: 3 Days Post-Op Procedure(s) (LRB): RIGHT TOTAL KNEE ARTHROPLASTY (Right) Patient reports pain as 3 on 0-10 scale.   Denies CP or SOB.  Voiding without difficulty. Positive flatus. Objective: Vital signs in last 24 hours: Temp:  [97.9 F (36.6 C)-99.6 F (37.6 C)] 97.9 F (36.6 C) (01/01 0500) Pulse Rate:  [91-108] 91 (01/01 0500) Resp:  [16] 16 (01/01 0500) BP: (127-155)/(65-91) 127/65 (01/01 0500) SpO2:  [93 %-96 %] 94 % (01/01 0500)  Intake/Output from previous day: No intake/output data recorded. Intake/Output this shift: No intake/output data recorded.  Labs:  Recent Labs  01/12/16 1256 01/13/16 0504 01/14/16 0338 01/15/16 0600  HGB 12.4 11.5* 11.2* 10.9*    Recent Labs  01/14/16 0338 01/15/16 0600  WBC 6.6 6.3  RBC 4.12 3.95  HCT 35.6* 33.5*  PLT 172 189    Recent Labs  01/12/16 1256 01/13/16 0504  NA  --  136  K  --  3.8  CL  --  102  CO2  --  26  BUN  --  9  CREATININE 0.64 0.67  GLUCOSE  --  122*  CALCIUM  --  8.4*    Recent Labs  01/14/16 0338 01/15/16 0600  INR 1.50 2.17    Physical Exam: Neurologically intact ABD soft Sensation intact distally Dorsiflexion/Plantar flexion intact Incision: dressing C/D/I No cellulitis present Compartment soft  Assessment/Plan: 3 Days Post-Op Procedure(s) (LRB): RIGHT TOTAL KNEE ARTHROPLASTY (Right) Advance diet Up with therapy  Doing well - plan on d/c today  Tracker Mance D for Dr. Melina Schools [facility] [phone] 01/15/2016, 9:00 AM

## 2016-01-15 NOTE — Care Management Note (Signed)
Case Management Note  Patient Details  Name: [name] MRN: [id number] Date of Birth: 01-05-1951  Subjective/Objective:  66 yr old female s/p right total knee arthroplasty.                  Action/Plan: Case manager spoke with patient concerning discharge plan and DME needs. Patient was preoperatively setup with [facility], no changes. Patient states she will go to outpatient therapy after [facility]. CM has ordered 3in1 from [place]. Patient will have family support at discharge.   Expected Discharge Date:    01/15/16              Expected Discharge Plan:  [place]  In-House Referral:  NA  Discharge planning Services  CM Consult  Post Acute Care Choice:  Durable Medical Equipment, Home Health Choice offered to:  Patient  DME Arranged:  3-N-1 DME Agency:  [place]:  PT [place]:  [facility] (formerly [facility]  Status of Service:  Completed, signed off  If discussed at [employer] of Stay Meetings, dates discussed:    Additional Comments:  Ninfa Meeker, RN 01/15/2016, 10:45 AM

## 2016-01-17 DIAGNOSIS — M25561 Pain in right knee: Secondary | ICD-10-CM | POA: Diagnosis not present

## 2016-01-17 DIAGNOSIS — Z7901 Long term (current) use of anticoagulants: Secondary | ICD-10-CM | POA: Diagnosis not present

## 2016-01-18 DIAGNOSIS — M1711 Unilateral primary osteoarthritis, right knee: Secondary | ICD-10-CM | POA: Diagnosis not present

## 2016-01-18 NOTE — Discharge Summary (Signed)
Physician Discharge Summary  Patient ID: [name] MRN: [id number] DOB/AGE: 22-Dec-1950 66 y.o.  Admit date: 01/12/2016 Discharge date: 01/18/2016  Admission Diagnoses:  Right knee OA  Discharge Diagnoses:  Active Problems:   Essential hypertension   H/O total knee replacement, right   Past Medical History:  Diagnosis Date  . Arthritis   . Cancer (HCC)    cervical  . COPD (chronic obstructive pulmonary disease) (HCC)    wheezing  . GERD (gastroesophageal reflux disease)   . Hypertension   . Liver disease   . Pre-diabetes   . Seizures ([place])    no meds for 8 months  . Sleep apnea    does not use her cpap  . Stroke [facility]) 2015   no weakness  . 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 female who was admitted 01/12/2016 with a diagnosis of Right knee OA and went to the operating room on 01/12/2016 and underwent the above named procedures.  Pt was inpatient for 3 days post op.  Pt experienced HTN post op.  Internal medicine consult noted that the pt a non compliant sleep apnea pt.  Recommended consult for such upon DC. Surgical pain controlled on oral medications.  Pt participated in PT while inpatient. Pt voiding w/o difficulty.   She was given perioperative antibiotics:  Anti-infectives    Start     Dose/Rate Route Frequency Ordered Stop   01/12/16 1300  ceFAZolin (ANCEF) IVPB 2g/100 mL premix     2 g 200 mL/hr over 30 Minutes Intravenous Every 6 hours 01/12/16 1206 01/12/16 1921   01/12/16 0700  ceFAZolin (ANCEF) IVPB 2g/100 mL premix     2 g 200 mL/hr over 30 Minutes Intravenous To [facility] 01/11/16 1155 01/12/16 0748    .  She was given sequential compression devices, early ambulation, and TED for DVT prophylaxis.  She benefited maximally  from the hospital stay and there were no complications.    Recent vital signs:  Vitals:   01/14/16 2036 01/15/16 0500  BP: (!) 155/74 127/65  Pulse: (!) 108 91  Resp: 16 16  Temp: 99.6 F (37.6 C) 97.9 F (36.6 C)    Recent laboratory studies:  Lab Results  Component Value Date   HGB 10.9 (L) 01/15/2016   HGB 11.2 (L) 01/14/2016   HGB 11.5 (L) 01/13/2016   Lab Results  Component Value Date   WBC 6.3 01/15/2016   PLT 189 01/15/2016   Lab Results  Component Value Date   INR 2.17 01/15/2016   Lab Results  Component Value Date   NA 136 01/13/2016   K 3.8 01/13/2016   CL 102 01/13/2016   CO2 26 01/13/2016   BUN 9 01/13/2016   CREATININE 0.67 01/13/2016   GLUCOSE 122 (H) 01/13/2016    Discharge Medications:   Allergies as of 01/15/2016      Reactions   Bee Venom Anaphylaxis   Codeine Hives   Sulfonamide Derivatives    UNSPECIFIED REACTION OF CHILDHOOD      Medication List    STOP taking these medications   gabapentin 300 MG capsule Commonly known as:  NEURONTIN   levETIRAcetam 500 MG tablet Commonly known as:  KEPPRA     TAKE these medications   albuterol 108 (90 Base) MCG/ACT  inhaler Commonly known as:  PROVENTIL HFA;VENTOLIN HFA Inhale 2 puffs into the lungs every 4 (four) hours as needed for wheezing or shortness of breath (cough).   aspirin 81 MG tablet Take 81 mg by mouth daily.   DULoxetine 60 MG capsule Commonly known as:  CYMBALTA Take 60 mg by mouth daily.   enoxaparin 40 MG/0.4ML injection Commonly known as:  LOVENOX Inject 0.4 mLs (40 mg total) into the skin daily. 30 days post op   methocarbamol 500 MG tablet Commonly known as:  ROBAXIN Take 1 tablet (500 mg total) by mouth 3 (three) times daily as needed.   oxyCODONE-acetaminophen 7.5-325 MG tablet Commonly known as:  PERCOCET Take 1-2 tablets by mouth every 4 (four) hours as needed for severe pain. What changed:  You were already taking a medication with the same name, and this  prescription was added. Make sure you understand how and when to take each.   oxyCODONE-acetaminophen 7.5-325 MG tablet Commonly known as:  PERCOCET Take 1 tablet by mouth every 4 (four) hours as needed. For pain What changed:  Another medication with the same name was added. Make sure you understand how and when to take each.   PREVACID 24HR 15 MG capsule Generic drug:  lansoprazole Take 15 mg by mouth daily as needed (for acid reflux).   rOPINIRole 1 MG tablet Commonly known as:  REQUIP Take 1 mg by mouth 2 (two) times daily.   rosuvastatin 10 MG tablet Commonly known as:  CRESTOR Take 10 mg by mouth daily.   valsartan 320 MG tablet Commonly known as:  DIOVAN Take 320 mg by mouth daily.   warfarin 5 MG tablet Commonly known as:  COUMADIN Take 0.5 tablets (2.5 mg total) by mouth daily. Take as directed per the pharmacist for INR target from 2.5-3.0 for 30 days post op       Diagnostic Studies: Dg Knee Right Port  Result Date: 01/12/2016 CLINICAL DATA:  66 year old female status post right knee replacement. Initial encounter. EXAM: PORTABLE RIGHT KNEE - 1-2 VIEW COMPARISON:  Right knee MRI 02/01/2003. FINDINGS: Portable AP and cross-table lateral views of the right knee. Total knee arthroplasty hardware is in place and appears intact. Postoperative changes to the surrounding soft tissues including intra-articular and subcutaneous gas. No unexpected osseous changes. IMPRESSION: Right total knee arthroplasty with no adverse features. Electronically Signed   By: Genevie Ann M.D.   On: 01/12/2016 10:42    Disposition: 01-Home or Self Care Post op medications provided Pt will present to clinic in 2 weeks   Follow-up Information    NORRIS,STEVEN R, MD. Call in 2 week(s).   Specialty:  Orthopedic Surgery Why:  [phone] Contact information: [address] [place] [address] [place] Follow up.   Specialty:  [place] Why:  Someone from [facility] will contact you to arrange start date and time for therapy. Contact information: [address] [place] [address] [phone]            Signed: Valinda Hoar 01/18/2016, 7:12 AM

## 2016-01-23 DIAGNOSIS — M1711 Unilateral primary osteoarthritis, right knee: Secondary | ICD-10-CM | POA: Diagnosis not present

## 2016-01-25 DIAGNOSIS — M1711 Unilateral primary osteoarthritis, right knee: Secondary | ICD-10-CM | POA: Diagnosis not present

## 2016-01-29 DIAGNOSIS — M1711 Unilateral primary osteoarthritis, right knee: Secondary | ICD-10-CM | POA: Diagnosis not present

## 2016-01-30 DIAGNOSIS — Z96651 Presence of right artificial knee joint: Secondary | ICD-10-CM | POA: Diagnosis not present

## 2016-01-30 DIAGNOSIS — Z471 Aftercare following joint replacement surgery: Secondary | ICD-10-CM | POA: Diagnosis not present

## 2016-02-06 DIAGNOSIS — M1711 Unilateral primary osteoarthritis, right knee: Secondary | ICD-10-CM | POA: Diagnosis not present

## 2016-02-08 DIAGNOSIS — I10 Essential (primary) hypertension: Secondary | ICD-10-CM | POA: Diagnosis not present

## 2016-02-08 DIAGNOSIS — E781 Pure hyperglyceridemia: Secondary | ICD-10-CM | POA: Diagnosis not present

## 2016-02-08 DIAGNOSIS — M1711 Unilateral primary osteoarthritis, right knee: Secondary | ICD-10-CM | POA: Diagnosis not present

## 2016-02-12 DIAGNOSIS — I739 Peripheral vascular disease, unspecified: Secondary | ICD-10-CM | POA: Diagnosis not present

## 2016-02-12 DIAGNOSIS — R0609 Other forms of dyspnea: Secondary | ICD-10-CM | POA: Diagnosis not present

## 2016-02-12 DIAGNOSIS — I10 Essential (primary) hypertension: Secondary | ICD-10-CM | POA: Diagnosis not present

## 2016-02-12 DIAGNOSIS — F17200 Nicotine dependence, unspecified, uncomplicated: Secondary | ICD-10-CM | POA: Diagnosis not present

## 2016-02-19 DIAGNOSIS — M1711 Unilateral primary osteoarthritis, right knee: Secondary | ICD-10-CM | POA: Diagnosis not present

## 2016-02-21 DIAGNOSIS — M1711 Unilateral primary osteoarthritis, right knee: Secondary | ICD-10-CM | POA: Diagnosis not present

## 2016-02-27 DIAGNOSIS — Z96651 Presence of right artificial knee joint: Secondary | ICD-10-CM | POA: Diagnosis not present

## 2016-02-27 DIAGNOSIS — Z471 Aftercare following joint replacement surgery: Secondary | ICD-10-CM | POA: Diagnosis not present

## 2016-02-27 DIAGNOSIS — M1711 Unilateral primary osteoarthritis, right knee: Secondary | ICD-10-CM | POA: Diagnosis not present

## 2016-02-28 DIAGNOSIS — Z79891 Long term (current) use of opiate analgesic: Secondary | ICD-10-CM | POA: Diagnosis not present

## 2016-02-28 DIAGNOSIS — M47812 Spondylosis without myelopathy or radiculopathy, cervical region: Secondary | ICD-10-CM | POA: Diagnosis not present

## 2016-02-28 DIAGNOSIS — M150 Primary generalized (osteo)arthritis: Secondary | ICD-10-CM | POA: Diagnosis not present

## 2016-02-28 DIAGNOSIS — G894 Chronic pain syndrome: Secondary | ICD-10-CM | POA: Diagnosis not present

## 2016-03-04 DIAGNOSIS — R002 Palpitations: Secondary | ICD-10-CM | POA: Diagnosis not present

## 2016-03-04 DIAGNOSIS — I739 Peripheral vascular disease, unspecified: Secondary | ICD-10-CM | POA: Diagnosis not present

## 2016-03-04 DIAGNOSIS — R0609 Other forms of dyspnea: Secondary | ICD-10-CM | POA: Diagnosis not present

## 2016-03-04 DIAGNOSIS — I10 Essential (primary) hypertension: Secondary | ICD-10-CM | POA: Diagnosis not present

## 2016-03-07 DIAGNOSIS — M1711 Unilateral primary osteoarthritis, right knee: Secondary | ICD-10-CM | POA: Diagnosis not present

## 2016-03-12 DIAGNOSIS — M1711 Unilateral primary osteoarthritis, right knee: Secondary | ICD-10-CM | POA: Diagnosis not present

## 2016-03-13 DIAGNOSIS — R002 Palpitations: Secondary | ICD-10-CM | POA: Diagnosis not present

## 2016-03-19 DIAGNOSIS — M1711 Unilateral primary osteoarthritis, right knee: Secondary | ICD-10-CM | POA: Diagnosis not present

## 2016-03-27 DIAGNOSIS — M150 Primary generalized (osteo)arthritis: Secondary | ICD-10-CM | POA: Diagnosis not present

## 2016-03-27 DIAGNOSIS — G894 Chronic pain syndrome: Secondary | ICD-10-CM | POA: Diagnosis not present

## 2016-03-27 DIAGNOSIS — M47812 Spondylosis without myelopathy or radiculopathy, cervical region: Secondary | ICD-10-CM | POA: Diagnosis not present

## 2016-03-27 DIAGNOSIS — Z79891 Long term (current) use of opiate analgesic: Secondary | ICD-10-CM | POA: Diagnosis not present

## 2016-03-28 DIAGNOSIS — M1711 Unilateral primary osteoarthritis, right knee: Secondary | ICD-10-CM | POA: Diagnosis not present

## 2016-04-02 DIAGNOSIS — M1711 Unilateral primary osteoarthritis, right knee: Secondary | ICD-10-CM | POA: Diagnosis not present

## 2016-04-04 DIAGNOSIS — M1711 Unilateral primary osteoarthritis, right knee: Secondary | ICD-10-CM | POA: Diagnosis not present

## 2016-04-09 DIAGNOSIS — Z96651 Presence of right artificial knee joint: Secondary | ICD-10-CM | POA: Diagnosis not present

## 2016-04-09 DIAGNOSIS — Z471 Aftercare following joint replacement surgery: Secondary | ICD-10-CM | POA: Diagnosis not present

## 2016-04-09 DIAGNOSIS — M1711 Unilateral primary osteoarthritis, right knee: Secondary | ICD-10-CM | POA: Diagnosis not present

## 2016-04-29 DIAGNOSIS — M150 Primary generalized (osteo)arthritis: Secondary | ICD-10-CM | POA: Diagnosis not present

## 2016-04-29 DIAGNOSIS — M47812 Spondylosis without myelopathy or radiculopathy, cervical region: Secondary | ICD-10-CM | POA: Diagnosis not present

## 2016-04-29 DIAGNOSIS — Z79891 Long term (current) use of opiate analgesic: Secondary | ICD-10-CM | POA: Diagnosis not present

## 2016-04-29 DIAGNOSIS — G894 Chronic pain syndrome: Secondary | ICD-10-CM | POA: Diagnosis not present

## 2016-05-06 DIAGNOSIS — R002 Palpitations: Secondary | ICD-10-CM | POA: Diagnosis not present

## 2016-05-06 DIAGNOSIS — R0609 Other forms of dyspnea: Secondary | ICD-10-CM | POA: Diagnosis not present

## 2016-05-06 DIAGNOSIS — I209 Angina pectoris, unspecified: Secondary | ICD-10-CM | POA: Diagnosis not present

## 2016-05-06 DIAGNOSIS — I739 Peripheral vascular disease, unspecified: Secondary | ICD-10-CM | POA: Diagnosis not present

## 2016-05-16 DIAGNOSIS — R0989 Other specified symptoms and signs involving the circulatory and respiratory systems: Secondary | ICD-10-CM | POA: Diagnosis not present

## 2016-05-27 DIAGNOSIS — M47812 Spondylosis without myelopathy or radiculopathy, cervical region: Secondary | ICD-10-CM | POA: Diagnosis not present

## 2016-05-27 DIAGNOSIS — Z79891 Long term (current) use of opiate analgesic: Secondary | ICD-10-CM | POA: Diagnosis not present

## 2016-05-27 DIAGNOSIS — M150 Primary generalized (osteo)arthritis: Secondary | ICD-10-CM | POA: Diagnosis not present

## 2016-05-27 DIAGNOSIS — G894 Chronic pain syndrome: Secondary | ICD-10-CM | POA: Diagnosis not present

## 2016-06-13 DIAGNOSIS — Z96651 Presence of right artificial knee joint: Secondary | ICD-10-CM | POA: Diagnosis not present

## 2016-06-13 DIAGNOSIS — Z471 Aftercare following joint replacement surgery: Secondary | ICD-10-CM | POA: Diagnosis not present

## 2016-06-26 DIAGNOSIS — I10 Essential (primary) hypertension: Secondary | ICD-10-CM | POA: Diagnosis not present

## 2016-06-26 DIAGNOSIS — R002 Palpitations: Secondary | ICD-10-CM | POA: Diagnosis not present

## 2016-06-26 DIAGNOSIS — I209 Angina pectoris, unspecified: Secondary | ICD-10-CM | POA: Diagnosis not present

## 2016-06-26 DIAGNOSIS — R0609 Other forms of dyspnea: Secondary | ICD-10-CM | POA: Diagnosis not present

## 2016-06-27 DIAGNOSIS — Z79891 Long term (current) use of opiate analgesic: Secondary | ICD-10-CM | POA: Diagnosis not present

## 2016-06-27 DIAGNOSIS — G894 Chronic pain syndrome: Secondary | ICD-10-CM | POA: Diagnosis not present

## 2016-06-27 DIAGNOSIS — M150 Primary generalized (osteo)arthritis: Secondary | ICD-10-CM | POA: Diagnosis not present

## 2016-06-27 DIAGNOSIS — M47812 Spondylosis without myelopathy or radiculopathy, cervical region: Secondary | ICD-10-CM | POA: Diagnosis not present

## 2016-07-10 DIAGNOSIS — I209 Angina pectoris, unspecified: Secondary | ICD-10-CM | POA: Diagnosis not present

## 2016-07-14 DIAGNOSIS — I739 Peripheral vascular disease, unspecified: Secondary | ICD-10-CM | POA: Diagnosis present

## 2016-07-14 NOTE — H&P (Signed)
OFFICE VISIT NOTES COPIED TO EPIC FOR DOCUMENTATION  . History of Present Illness Laverda Page MD; 06/26/2016 8:54 PM) Patient words: Last OV 05/06/2016; 6 week FU for pad, angina, bruit, carotid duplex, pt hasnt picked up HCTZ from pharmacy yet, Zontivy cost too much.  The patient is a 66 year old female who presents for a Follow-up for Shortness of breath.  Additional reasons for visit:  Follow-up for Peripheral vascular disease is described as the following: Patient was seen by me 2 months ago for evaluation of peripheral arterial disease and also palpitations. She continues to complain of cramping in her legs right worse than the left. She was started on Zontivity which helped her claudication especially night cramps but could not afford this.  She continues to have occasional chest discomfort, lasting for 10-15 minutes with radiation to the back and sometimes to her jaw. But her main complaint is claudication and marked dyspnea. Unfortunately contineud to smoke about 2 PPD. Has occasional palpitations.  No recent weight change, no dark stools, She has a history of hypertension, hypertriglyceridemia, prediabetes. she is not a diabetic. 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  In Nov 2017, Stress test was negative for evidence of ischemia, echo revealed grade I diastolic dysfunction and mild MR, but was otherwise normal. Right ABI was reduced at 0.76, LABI normal at 1.00 and abdominal aortic duplex revealed diffuse calcific plaque without AAA.   Problem List/Past Medical Anderson Malta Sergeant; 06/26/2016 2:17 PM) Hypertension, essential, benign (I10)  Mild hyperlipidemia (E78.5)  Pre-diabetes (R73.03)  Tobacco use disorder (F17.200)  Labwork  Labs 02/07/2016: Total was 127, triglycerides 131, HDL 43, LDL 58, LDL particle number 690. LP-IR 52. Serum glucose 140 mg, BUN 10, creatinine 0.68, potassium 4.5. CMP normal. Alkaline phosphatase minimally elevated at 139. 10/16/2015:  Glucose 158, creatinine 0.6, potassium 4.9, CMP normal, total cholesterol 165, triglycerides 173, HDL 38, LDL 94, LDL particle #1181, TSH 3.66, vitamin D 18.8, CBC normal Screening for AAA (abdominal aortic aneurysm) (Z13.6)  Abdominal aortic duplex 11/15/2015: Diffuse calcific plaque noted in the proximal, mid and distal aorta. The maximum aorta diameter is 2.48 cm (dist). Normal iliac velocity. No AAA. Screening (Z13.9)  ABI 11/22/2015: This exam reveals moderately decreased perfusion of the right lower extremity with RABI 0.76 and normal perfusion of the left lower extremity with LABI 1.00. Dyspnea on exertion (R06.09)  Echocardiogram 11/22/2015: Left ventricle cavity is normal in size. Normal global wall motion. Doppler evidence of grade I (impaired) diastolic dysfunction, normal LAP. Calculated EF 63%. Mild (Grade I) mitral regurgitation. Trace tricuspid regurgitation. Unable to estimate PA pressure due to absence/minimal TR signal. PAD (peripheral artery disease) (I73.9)  Lower extremity arterial duplex 12/14/2015: No hemodynamically significant stenoses are identified in the lower extremity arterial system. Left mid SFA <50% stenosis. This exam reveals moderately decreased perfusion of the right lower extremity, with RABI 0.67 and mildly decreased perfusion of the left lower extremity with LABI 0.81, noted at the post tibial artery level with biphasic waveforms below the knee vessels. Study suggests diffuse disease. ABI 11/22/2015: This exam reveals moderately decreased perfusion of the right lower extremity with RABI 0.76 and normal perfusion of the left lower extremity with LABI 1.00. Itching (L29.9)  Intermittent palpitations (R00.2)  Event Monitor 2 weeks 03/04/2016: Patient symptoms of palpitations and skipped beat revealed normal sinus rhythm. Episode of chest pain does reveal 2.5 mm horizontal ST segment depression. Angina pectoris (I20.9)  Lexiscan myoview stress test 11/06/2015: 1. The  resting electrocardiogram  demonstrated normal sinus rhythm, normal resting conduction, no resting arrhythmias and normal rest repolarization. Stress EKG is non-diagnostic for ischemia as it a pharmacologic stress using Lexiscan. 2. The perfusion imaging study demonstrates breast attenuation artifact in anterior wall on rest images, which improves in stress images, there is no evidence of ischemia or scar. Normal wall motion and normal LV systolic function, EF 32%. This represents a low risk study. Bilateral carotid bruits (R09.89)  Carotid artery duplex 05/16/2016: Stenosis in the right internal carotid artery (16-49%). Mild stenosis in the right external carotid artery (<50%). Stenosis in the left internal carotid artery (16-49%). Mild stenosis in the left external carotid artery (<50%). Antegrade vertebral artery flow. Follow up in one year is appropriate if clinically indicated.  Allergies Laverda Page, MD; 2016-07-05 2:56 PM) Codeine/Codeine Derivatives  Itching. can take at times Sulfa Drugs  BEE VENOM  Iohexol *DIAGNOSTIC PRODUCTS*  Anaphylaxis, Difficulty breathing.  Family History Anderson Malta Sergeant; 2016/07/05 2:17 PM) Mother  Deceased. at age 47 unknown cause; mini strokes, heart disease Father  Deceased. at age 46 from dementia;heart problems & htn Sister 2  half sisters; younger; no known heart conditions Brother 3  1 older has heart arrhythmia 2 younger;  Social History Anderson Malta Sergeant; 2016-07-05 2:17 PM) Current tobacco use  Current every day smoker. 1.5 - 2 ppd, approx 55-60 pack year history Non Drinker/No Alcohol Use  Marital status  Married. Number of Children  2. Living Situation  Lives with spouse.  Past Surgical History Anderson Malta Sergeant; 05-Jul-2016 2:17 PM) Cholecystectomy [1976]: Cesarean Delivery [09/02/1983]: Tubal Ligation [06/15/1984]: Arthroscopic Knee Surgery - Left [07/29/1986]: Total Hip Replacement - Left [02/1997]: Hysterectomy;  Total [06/16/2000]: Discectomy [10/06/2007]: Carpal Tunnel Surgery - Both [3557]: Elbow Surgery [12/19/2015]: Right. Total Knee Replacement - Right [01/12/2016]:  Medication History Anderson Malta Sergeant; July 05, 2016 2:30 PM) Losartan Potassium (100MG  Tablet, 1 (one) Tablet Oral daily, Taken starting 06/18/2016) Active. (D/C Valsartan d/t cost) HydroCHLOROthiazide (12.5MG  Tablet, 1 (one) Tablet Oral daily, Taken starting 06/18/2016) Active. Nitrostat (0.4MG  Tab Sublingual, 1 (one) Tablet Sublingual every 5 minutes as needed for chest pain., Taken starting 05/17/2016) Active. Zontivity (2.08MG  Tablet, 1 (one) Tablet Oral daily, Taken starting 05/17/2016) Active. BuPROPion HCl ER (Smoking Det) (150MG  Tablet ER 12HR, 1 (one) Tablet Oral am and 4 pm, Taken starting 05/17/2016) Active. (Start once daily for 3 days then twice daily) DilTIAZem CD (240MG  Capsule ER 24HR, 1 (one) Capsule Oral daily, Taken starting 05/08/2016) Active. Rosuvastatin Calcium (10MG  Tablet, 1 (one) Tablet Tablet Tablet Oral daily, Taken starting 02/12/2016) Active. ROPINIRole HCl (1MG  Tablet, 1 Oral four times daily) Active. Oxycodone-Acetaminophen (7.5-325MG  Tablet, 1 Oral every four hours as needed for pain) Active. DULoxetine HCl (60MG  Capsule DR Part, 1 Oral daily) Active. Aspirin (81MG  Tablet Chewable, 1 Oral two times daily) Active. Prevacid 24HR (15MG  Capsule DR, 1 Oral evey 4 hours as needed) Active. Ventolin HFA (108 (90 Base)MCG/ACT Aerosol Soln, 1-2 puffs Inhalation as needed) Active. EpiPen 2-Pak (0.3MG /0.3ML Soln Auto-inj, Injection as directed) Active. Methocarbamol (500MG  Tablet, 1 Oral daily as needed) Active. Tums (1-2 Oral as needed) Specific strength unknown - Active. Docusate Sodium (100MG  Capsule, 2 Oral daily) Active. Medications Reconciled (medication present)  Diagnostic Studies History Anderson Malta Sergeant; 07/05/2016 8:34 AM) Carotid Doppler [05/16/2016]: Stenosis in the right  internal carotid artery (16-49%). Mild stenosis in the right external carotid artery (<50%). Stenosis in the left internal carotid artery (16-49%). Mild stenosis in the left external carotid artery (<50%). Antegrade vertebral artery flow. Follow up in one year is  appropriate if clinically indicated.    Review of Systems Laverda Page MD; 06/26/2016 8:55 PM) General Not Present- Anorexia, Fatigue and Fever. Skin Present- Itching. Respiratory Present- Difficulty Breathing on Exertion. Not Present- Cough. Cardiovascular Present- Chest Pain (improved from previous), Night Cramps and Palpitations. Not Present- Edema, Orthopnea and Paroxysmal Nocturnal Dyspnea. Gastrointestinal Not Present- Black, Tarry Stool, Change in Bowel Habits and Nausea. Musculoskeletal Present- Joint Pain. Neurological Present- Dizziness. Not Present- Focal Neurological Symptoms and Syncope. Endocrine Not Present- Cold Intolerance, Excessive Sweating, Heat Intolerance and Thyroid Problems. Hematology Not Present- Anemia, Easy Bruising, Petechiae and Prolonged Bleeding.  Vitals Anderson Malta Sergeant; 06/26/2016 2:32 PM) 06/26/2016 2:19 PM Weight: 205.06 lb Height: 61in Body Surface Area: 1.91 m Body Mass Index: 38.75 kg/m  Pulse: 90 (Regular)  P.OX: 97% (Room air) BP: 138/63 (Sitting, Left Arm, Standard)       Physical Exam Laverda Page MD; 06/26/2016 9:00 PM) General Mental Status-Alert. General Appearance-Cooperative, Appears stated age, Not in acute distress. Build & Nutrition-Short statured and Morbidly obese.  Head and Neck Neck -Note: Short neck and difficult to evaluate JVD.  Thyroid Gland Characteristics - no palpable nodules, no palpable enlargement.  Cardiovascular Cardiovascular examination reveals -normal heart sounds, regular rate and rhythm with no murmurs. Inspection Jugular vein - Right - No Distention.  Abdomen Inspection Contour - Obese and Pannus  present. Palpation/Percussion Normal exam - Non Tender and No hepatosplenomegaly. Auscultation Normal exam - Bowel sounds normal.  Peripheral Vascular Lower Extremity Inspection - Bilateral - Inspection Normal. Palpation - Edema - Bilateral - 2+ Pitting edema(ankle). Femoral pulse - Bilateral - Feeble(Pulsus difficult to feel due to patient's bodily habitus.), No Bruits. Popliteal pulse - Bilateral - Feeble(Pulsus difficult to feel due to patient's bodily habitus.). Dorsalis pedis pulse - Bilateral - Normal. Posterior tibial pulse - Bilateral - Normal. Carotid arteries - Bilateral-No Carotid bruit.  Neurologic Neurologic evaluation reveals -alert and oriented x 3 with no impairment of recent or remote memory. Motor-Grossly intact without any focal deficits.  Musculoskeletal Global Assessment Left Lower Extremity - normal range of motion without pain. Right Lower Extremity - normal range of motion without pain.    Assessment & Plan Laverda Page MD; 06/26/2016 9:00 PM) Intermittent palpitations (R00.2) Story: Event Monitor 2 weeks 03/04/2016: Patient symptoms of palpitations and skipped beat revealed normal sinus rhythm. Episode of chest pain does reveal 2.5 mm horizontal ST segment depression. Impression: EKG 03/04/2016: Sinus tachycardia to 104 bpm, biatrial enlargement, low voltage complexes. Nonspecific T abnormality. Angina pectoris (I20.9) Story: Lexiscan myoview stress test 11/06/2015: 1. The resting electrocardiogram demonstrated normal sinus rhythm, normal resting conduction, no resting arrhythmias and normal rest repolarization. Stress EKG is non-diagnostic for ischemia as it a pharmacologic stress using Lexiscan. 2. The perfusion imaging study demonstrates breast attenuation artifact in anterior wall on rest images, which improves in stress images, there is no evidence of ischemia or scar. Normal wall motion and normal LV systolic function, EF 53%. This represents  a low risk study. Impression: Event Monitor 2 weeks 03/04/2016: Patient symptoms of palpitations and skipped beat revealed normal sinus rhythm. Episode of chest pain does reveal 2.5 mm horizontal ST segment depression. Future Plans 6/64/4034: METABOLIC PANEL, BASIC (74259) - one time 07/08/2016: CBC & PLATELETS (AUTO) (56387) - one time 07/08/2016: PT (PROTHROMBIN TIME) (56433) - one time Dyspnea on exertion (R06.09) Story: Echocardiogram 11/22/2015: Left ventricle cavity is normal in size. Normal global wall motion. Doppler evidence of grade I (impaired) diastolic dysfunction, normal LAP. Calculated EF  63%. Mild (Grade I) mitral regurgitation. Trace tricuspid regurgitation. Unable to estimate PA pressure due to absence/minimal TR signal. Hypertension, essential, benign (I10) Current Plans Continued HydroCHLOROthiazide 12.5MG , 1 (one) Tablet daily, #90, 06/26/2016, Ref. x3. PAD (peripheral artery disease) (I73.9) Story: Lower extremity arterial duplex 12/14/2015: No hemodynamically significant stenoses are identified in the lower extremity arterial system. Left mid SFA <50% stenosis. This exam reveals moderately decreased perfusion of the right lower extremity, with RABI 0.67 and mildly decreased perfusion of the left lower extremity with LABI 0.81, noted at the post tibial artery level with biphasic waveforms below the knee vessels. Study suggests diffuse disease.  ABI 11/22/2015: This exam reveals moderately decreased perfusion of the right lower extremity with RABI 0.76 and normal perfusion of the left lower extremity with LABI 1.00. Current Plans Started Clopidogrel Bisulfate 75MG , 1 (one) Tablet daily, #30, 06/26/2016, Ref. x3. Labwork  07/11/2016: Creatinine 0.65, potassium 3.7, BMP normal.  Hematocrit 33.6, MCV 78, MCH 25.6, RDW 17.2%.  CBC otherwise normal.  INR 1.0, prothrombin time 9.9. Labs 02/07/2016: Total was 127, triglycerides 131, HDL 43, LDL 58, LDL particle number 690. LP-IR 52.  Serum glucose 140 mg, BUN 10, creatinine 0.68, potassium 4.5. CMP normal. Alkaline phosphatase minimally elevated at 139.  10/16/2015: Glucose 158, creatinine 0.6, potassium 4.9, CMP normal, total cholesterol 165, triglycerides 173, HDL 38, LDL 94, LDL particle #1181, TSH 3.66, vitamin D 18.8, CBC normal Bilateral carotid bruits (R09.89) Story: Carotid artery duplex 05/16/2016: Stenosis in the right internal carotid artery (16-49%). Mild stenosis in the right external carotid artery (<50%). Stenosis in the left internal carotid artery (16-49%). Mild stenosis in the left external carotid artery (<50%). Antegrade vertebral artery flow. Follow up in one year is appropriate if clinically indicated. Tobacco use disorder (F17.200) Current Plans Mechanism of underlying disease process and action of medications discussed with the patient. I discussed primary/secondary prevention and also dietary counseling was done. She still continues to have chest pain with radiation to her jaw occasionally with exertion activity but overall has improved. Event monitor during evaluation of palpitation also revealed significant ST depression during palpitations. I have advised her strongly discontinued tobacco use. Since being on medical therapy she has done well, symptoms of angina has improved. I referred her hydrochlorothiazide. Due to symptoms of claudication that are getting worse, I have added Plavix 75 mg daily, patient could not afford Zontivity which did improve her symptoms. Carotid duplex discussed, mild disease is evident. Due to continued symptoms of claudication, I have recommended proceeding with peripheral angiography.  Needs to schedule for peripheral arteriogram and possible angioplasty given symptoms. Patient understands the risks, benefits, alternatives including medical therapy, CT angiography. Patient understands <1-2% risk of death, embolic complications, bleeding, infection, renal failure, urgent  surgical revascularization, but not limited to these and wants to proceed. Office visit after the tests.  CC: Alvester Chou, NP    Signed by Laverda Page, MD (06/26/2016 9:01 PM)

## 2016-07-16 ENCOUNTER — Encounter (HOSPITAL_COMMUNITY)
Admission: RE | Disposition: A | Discharge status: Home / Self Care | Payer: Self-pay | Source: Ambulatory Visit | Attending: Cardiology

## 2016-07-16 ENCOUNTER — Encounter (HOSPITAL_COMMUNITY): Payer: Self-pay | Admitting: Cardiology

## 2016-07-16 ENCOUNTER — Ambulatory Visit (HOSPITAL_COMMUNITY)
Admission: RE | Admit: 2016-07-16 | Discharge: 2016-07-16 | Disposition: A | Discharge status: Home / Self Care | Payer: Medicare Other | Source: Ambulatory Visit | Attending: Cardiology | Admitting: Cardiology

## 2016-07-16 DIAGNOSIS — I701 Atherosclerosis of renal artery: Secondary | ICD-10-CM | POA: Diagnosis not present

## 2016-07-16 DIAGNOSIS — I6523 Occlusion and stenosis of bilateral carotid arteries: Secondary | ICD-10-CM | POA: Insufficient documentation

## 2016-07-16 DIAGNOSIS — I10 Essential (primary) hypertension: Secondary | ICD-10-CM | POA: Diagnosis not present

## 2016-07-16 DIAGNOSIS — E785 Hyperlipidemia, unspecified: Secondary | ICD-10-CM | POA: Diagnosis not present

## 2016-07-16 DIAGNOSIS — F17210 Nicotine dependence, cigarettes, uncomplicated: Secondary | ICD-10-CM | POA: Diagnosis not present

## 2016-07-16 DIAGNOSIS — Z7982 Long term (current) use of aspirin: Secondary | ICD-10-CM | POA: Insufficient documentation

## 2016-07-16 DIAGNOSIS — I739 Peripheral vascular disease, unspecified: Secondary | ICD-10-CM | POA: Diagnosis not present

## 2016-07-16 DIAGNOSIS — Z882 Allergy status to sulfonamides status: Secondary | ICD-10-CM | POA: Diagnosis not present

## 2016-07-16 DIAGNOSIS — E1151 Type 2 diabetes mellitus with diabetic peripheral angiopathy without gangrene: Secondary | ICD-10-CM | POA: Diagnosis not present

## 2016-07-16 DIAGNOSIS — I70213 Atherosclerosis of native arteries of extremities with intermittent claudication, bilateral legs: Secondary | ICD-10-CM | POA: Diagnosis not present

## 2016-07-16 DIAGNOSIS — J449 Chronic obstructive pulmonary disease, unspecified: Secondary | ICD-10-CM | POA: Diagnosis not present

## 2016-07-16 HISTORY — PX: LOWER EXTREMITY ANGIOGRAPHY: CATH118251

## 2016-07-16 SURGERY — LOWER EXTREMITY ANGIOGRAPHY
Anesthesia: LOCAL

## 2016-07-16 MED ORDER — MIDAZOLAM HCL 2 MG/2ML IJ SOLN
INTRAMUSCULAR | Status: DC | PRN
  Administered 2016-07-16: 1 mg via INTRAVENOUS
  Administered 2016-07-16: 2 mg via INTRAVENOUS

## 2016-07-16 MED ORDER — MIDAZOLAM HCL 2 MG/2ML IJ SOLN
INTRAMUSCULAR | Status: AC
  Filled 2016-07-16: qty 2

## 2016-07-16 MED ORDER — HYDROMORPHONE HCL 1 MG/ML IJ SOLN
INTRAMUSCULAR | Status: DC | PRN
  Administered 2016-07-16 (×3): 0.5 mg via INTRAVENOUS

## 2016-07-16 MED ORDER — HEPARIN (PORCINE) IN NACL 2-0.9 UNIT/ML-% IJ SOLN
INTRAMUSCULAR | Status: AC
  Filled 2016-07-16: qty 1000

## 2016-07-16 MED ORDER — HYDROMORPHONE HCL 1 MG/ML IJ SOLN
INTRAMUSCULAR | Status: AC
  Filled 2016-07-16: qty 0.5

## 2016-07-16 MED ORDER — LIDOCAINE HCL (PF) 1 % IJ SOLN
INTRAMUSCULAR | Status: DC | PRN
  Administered 2016-07-16: 22 mL

## 2016-07-16 MED ORDER — SODIUM CHLORIDE 0.9 % IV SOLN: 1.0000 mL/kg/h | INTRAVENOUS | Status: DC

## 2016-07-16 MED ORDER — LIDOCAINE HCL (PF) 1 % IJ SOLN
INTRAMUSCULAR | Status: AC
  Filled 2016-07-16: qty 30

## 2016-07-16 MED ORDER — HEPARIN (PORCINE) IN NACL 2-0.9 UNIT/ML-% IJ SOLN
INTRAMUSCULAR | Status: AC | PRN
  Administered 2016-07-16: 1000 mL

## 2016-07-16 MED ORDER — SODIUM CHLORIDE 0.9 % IV SOLN
INTRAVENOUS | Status: DC
  Administered 2016-07-16: 08:00:00 via INTRAVENOUS

## 2016-07-16 MED ORDER — SODIUM CHLORIDE 0.9% FLUSH: 3.0000 mL | INTRAVENOUS | Status: DC | PRN

## 2016-07-16 MED ORDER — SODIUM CHLORIDE 0.9 % IV SOLN: 250.0000 mL | INTRAVENOUS | Status: DC | PRN

## 2016-07-16 MED ORDER — SODIUM CHLORIDE 0.9% FLUSH: 3.0000 mL | Freq: Two times a day (BID) | INTRAVENOUS | Status: DC

## 2016-07-16 MED ORDER — IODIXANOL 320 MG/ML IV SOLN
INTRAVENOUS | Status: DC | PRN
  Administered 2016-07-16: 105 mL via INTRA_ARTERIAL

## 2016-07-16 SURGICAL SUPPLY — 11 items
CATH OMNI FLUSH 5F 65CM (CATHETERS) ×1 IMPLANT
COVER PRB 48X5XTLSCP FOLD TPE (BAG) IMPLANT
COVER PROBE 5X48 (BAG) ×2
KIT MICROINTRODUCER STIFF 5F (SHEATH) ×1 IMPLANT
KIT PV (KITS) ×2 IMPLANT
SHEATH PINNACLE 5F 10CM (SHEATH) ×1 IMPLANT
SYRINGE MEDRAD AVANTA MACH 7 (SYRINGE) ×1 IMPLANT
TRANSDUCER W/STOPCOCK (MISCELLANEOUS) ×2 IMPLANT
TRAY PV CATH (CUSTOM PROCEDURE TRAY) ×2 IMPLANT
TUBING CIL FLEX 10 FLL-RA (TUBING) ×1 IMPLANT
WIRE HI TORQ VERSACORE J 260CM (WIRE) ×1 IMPLANT

## 2016-07-16 NOTE — Progress Notes (Signed)
65fr sheath was removed from patient's RFA using manual pressure wich was applied for 20 minutes.  Patient toleratted well with no discomfort or complications noted.  Groin site looks good and a tegaderm was applied to the site.  Post care instructions were given to the patient.  Vital signs: WNL

## 2016-07-16 NOTE — Discharge Instructions (Signed)
Angiogram, Care After °This sheet gives you information about how to care for yourself after your procedure. Your health care provider may also give you more specific instructions. If you have problems or questions, contact your health care provider. °What can I expect after the procedure? °After the procedure, it is common to have bruising and tenderness at the catheter insertion area. °Follow these instructions at home: °Insertion site care  °· Follow instructions from your health care provider about how to take care of your insertion site. Make sure you: °¨ Wash your hands with soap and water before you change your bandage (dressing). If soap and water are not available, use hand sanitizer. °¨ Change your dressing as told by your health care provider. °¨ Leave stitches (sutures), skin glue, or adhesive strips in place. These skin closures may need to stay in place for 2 weeks or longer. If adhesive strip edges start to loosen and curl up, you may trim the loose edges. Do not remove adhesive strips completely unless your health care provider tells you to do that. °· Do not take baths, swim, or use a hot tub until your health care provider approves. °· You may shower 24-48 hours after the procedure or as told by your health care provider. °¨ Gently wash the site with plain soap and water. °¨ Pat the area dry with a clean towel. °¨ Do not rub the site. This may cause bleeding. °· Do not apply powder or lotion to the site. Keep the site clean and dry. °· Check your insertion site every day for signs of infection. Check for: °¨ Redness, swelling, or pain. °¨ Fluid or blood. °¨ Warmth. °¨ Pus or a bad smell. °Activity  °· Rest as told by your health care provider, usually for 1-2 days. °· Do not lift anything that is heavier than 10 lbs. (4.5 kg) or as told by your health care provider. °· Do not drive for 24 hours if you were given a medicine to help you relax (sedative). °· Do not drive or use heavy machinery while  taking prescription pain medicine. °General instructions  °· Return to your normal activities as told by your health care provider, usually in about a week. Ask your health care provider what activities are safe for you. °· If the catheter site starts bleeding, lie flat and put pressure on the site. If the bleeding does not stop, get help right away. This is a medical emergency. °· Drink enough fluid to keep your urine clear or pale yellow. This helps flush the contrast dye from your body. °· Take over-the-counter and prescription medicines only as told by your health care provider. °· Keep all follow-up visits as told by your health care provider. This is important. °Contact a health care provider if: °· You have a fever or chills. °· You have redness, swelling, or pain around your insertion site. °· You have fluid or blood coming from your insertion site. °· The insertion site feels warm to the touch. °· You have pus or a bad smell coming from your insertion site. °· You have bruising around the insertion site. °· You notice blood collecting in the tissue around the catheter site (hematoma). The hematoma may be painful to the touch. °Get help right away if: °· You have severe pain at the catheter insertion area. °· The catheter insertion area swells very fast. °· The catheter insertion area is bleeding, and the bleeding does not stop when you hold steady pressure on   the area. °· The area near or just beyond the catheter insertion site becomes pale, cool, tingly, or numb. °These symptoms may represent a serious problem that is an emergency. Do not wait to see if the symptoms will go away. Get medical help right away. Call your local emergency services (911 in the [place] Do not drive yourself to the hospital. °Summary °· After the procedure, it is common to have bruising and tenderness at the catheter insertion area. °· After the procedure, it is important to rest and drink plenty of fluids. °· Do not take baths,  swim, or use a hot tub until your health care provider says it is okay to do so. You may shower 24-48 hours after the procedure or as told by your health care provider. °· If the catheter site starts bleeding, lie flat and put pressure on the site. If the bleeding does not stop, get help right away. This is a medical emergency. °This information is not intended to replace advice given to you by your health care provider. Make sure you discuss any questions you have with your health care provider. °Document Released: 07/19/2004 Document Revised: 12/06/2015 Document Reviewed: 12/06/2015 °Elsevier Interactive Patient Education © 2017 [employer] ° °

## 2016-07-16 NOTE — Interval H&P Note (Signed)
History and Physical Interval Note:  07/16/2016 9:03 AM  [name]  has presented today for surgery, with the diagnosis of claudication  The various methods of treatment have been discussed with the patient and family. After consideration of risks, benefits and other options for treatment, the patient has consented to  Procedure(s): Lower Extremity Angiography (N/A) and possible angioplasty as a surgical intervention .  The patient's history has been reviewed, patient examined, no change in status, stable for surgery.  I have reviewed the patient's chart and labs.  Questions were answered to the patient's satisfaction.     Adrian Prows

## 2016-07-26 ENCOUNTER — Encounter (HOSPITAL_COMMUNITY): Payer: Self-pay | Admitting: Emergency Medicine

## 2016-07-26 ENCOUNTER — Ambulatory Visit (INDEPENDENT_AMBULATORY_CARE_PROVIDER_SITE_OTHER): Payer: Medicare Other

## 2016-07-26 ENCOUNTER — Ambulatory Visit (HOSPITAL_COMMUNITY)
Admission: EM | Admit: 2016-07-26 | Discharge: 2016-07-26 | Disposition: A | Discharge status: Home / Self Care | Payer: Medicare Other | Attending: Internal Medicine | Admitting: Internal Medicine

## 2016-07-26 DIAGNOSIS — M542 Cervicalgia: Secondary | ICD-10-CM

## 2016-07-26 DIAGNOSIS — M19041 Primary osteoarthritis, right hand: Secondary | ICD-10-CM | POA: Diagnosis not present

## 2016-07-26 DIAGNOSIS — M654 Radial styloid tenosynovitis [de Quervain]: Secondary | ICD-10-CM

## 2016-07-26 MED ORDER — PREDNISONE 10 MG (21) PO TBPK: ORAL_TABLET | ORAL | 0 refills | Status: DC

## 2016-07-26 NOTE — ED Triage Notes (Signed)
Pt c/o left hand pain and left side of neck pain onset last night associated w/ithcing   Pain increases w/activity   Denies inj/trauma  Taking Percocet w/some relief.   A&O x4... Ambulatory.... NAD

## 2016-07-26 NOTE — ED Provider Notes (Signed)
CSN: [id number]     Arrival date & time 07/26/16  1209 History   First MD Initiated Contact with Patient 07/26/16 1408     Chief Complaint  Patient presents with  . Arm Pain   (Consider location/radiation/quality/duration/timing/severity/associated sxs/prior Treatment) 66 year old female presents with left hand and arm pain and left neck pain ongoing for 24 hours. She denies any traumatic cause, has not fallen, not lifting any heavy objects, not had any other source of injury. Chest notable swelling to the left hand. No fever or chills or any signs of systemic illness. No red streaking, she took her oxycodone and is given her some relief.   The history is provided by the patient.    Past Medical History:  Diagnosis Date  . Arthritis   . Cancer (HCC)    cervical  . COPD (chronic obstructive pulmonary disease) (HCC)    wheezing  . GERD (gastroesophageal reflux disease)   . Hypertension   . Liver disease   . Pre-diabetes   . Seizures ([place])    no meds for 8 months  . Sleep apnea    does not use her cpap  . Stroke [facility]) 2015   no weakness  . 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 BACK SURGERY    . CESAREAN SECTION    . CHOLECYSTECTOMY    . HAND SURGERY Bilateral    carpal tunnel  . HEMORRHOID SURGERY    . KNEE SURGERY Left    arthroscopy x3  . LOWER EXTREMITY ANGIOGRAPHY N/A 07/16/2016   Procedure: Lower Extremity Angiography;  Surgeon: Adrian Prows, MD;  Location: [place] [facility];  Service: Cardiovascular;  Laterality: N/A;  . NECK SURGERY    . OTHER SURGICAL HISTORY  2013   bladder stimulator in back  . TENNIS ELBOW RELEASE/NIRSCHEL PROCEDURE Right 12/2015  . TOE SURGERY Right    right foot second and fourth toe  . TOTAL HIP ARTHROPLASTY Left   . TOTAL KNEE ARTHROPLASTY Right 01/12/2016   Procedure: RIGHT TOTAL KNEE ARTHROPLASTY;  Surgeon: Netta Cedars, MD;  Location: [place];  Service: Orthopedics;  Laterality: Right;   . TUBAL LIGATION    . VAGINAL HYSTERECTOMY     Family History  Problem Relation Age of Onset  . Hyperlipidemia Mother   . Hypertension Mother   . Hyperlipidemia Father   . Hypertension Father   . Alzheimer's disease Father   . Stroke Brother   . Cancer Daughter        unknown   Social History  Substance Use Topics  . Smoking status: Current Every Day Smoker    Packs/day: 1.50    Years: 53.00  . Smokeless tobacco: Never Used     Comment: DOWN TO 1.5PACK DAY  . Alcohol use No   OB History    No data available     Review of Systems  Constitutional: Negative.   HENT: Negative.   Respiratory: Negative.   Cardiovascular: Negative.   Gastrointestinal: Negative.   Musculoskeletal: Positive for joint swelling, neck pain and neck stiffness.  Skin: Negative.   Neurological: Negative.     Allergies  Bee venom; Codeine; Morphine and related; and Sulfonamide derivatives  Home Medications   Prior to Admission medications   Medication Sig Start Date End Date Taking? Authorizing Provider  albuterol (PROVENTIL HFA;VENTOLIN HFA) 108 (90 BASE) MCG/ACT inhaler Inhale 2 puffs into the lungs every 4 (four) hours as needed for wheezing  or shortness of breath (cough). 11/10/14  Yes Pisciotta, Charna Elizabeth  aspirin EC 81 MG tablet Take 81 mg by mouth daily.   Yes [provider]  clopidogrel (PLAVIX) 75 MG tablet Take 75 mg by mouth daily after breakfast. 06/26/16  Yes [provider]  diltiazem (CARDIZEM CD) 240 MG 24 hr capsule Take 240 mg by mouth daily after breakfast. 06/12/16  Yes [provider]  docusate sodium (COLACE) 100 MG capsule Take 200 mg by mouth daily after breakfast.   Yes [provider]  DULoxetine (CYMBALTA) 60 MG capsule Take 60 mg by mouth daily after breakfast.    Yes [provider]  hydrochlorothiazide (HYDRODIURIL) 12.5 MG tablet Take 12.5 mg by mouth daily after breakfast. 06/18/16  Yes [provider]    lansoprazole (PREVACID 24HR) 15 MG capsule Take 15 mg by mouth daily as needed (for acid reflux).   Yes [provider]  losartan (COZAAR) 100 MG tablet Take 100 mg by mouth daily after breakfast. 06/27/16  Yes [provider]  methocarbamol (ROBAXIN) 500 MG tablet Take 1 tablet (500 mg total) by mouth 3 (three) times daily as needed. 01/12/16  Yes Netta Cedars, MD  oxyCODONE-acetaminophen (PERCOCET) 7.5-325 MG tablet Take 1-2 tablets by mouth every 4 (four) hours as needed for severe pain. 01/12/16  Yes Netta Cedars, MD  rOPINIRole (REQUIP) 1 MG tablet Take 1 mg by mouth 4 (four) times daily.    Yes [provider]  buPROPion (WELLBUTRIN XL) 150 MG 24 hr tablet Take 150-300 mg by mouth See admin instructions. TAKE 1 TABLET BY MOUTH IN THE MORNING & AT 4PM. START WITH 1 DAILY FOR 3 DAYS THEN INCREASE TO 2. 05/17/16   [provider]  nitroGLYCERIN (NITROSTAT) 0.4 MG SL tablet Place 0.4 mg under the tongue every 5 (five) minutes x 3 doses as needed. For chest pain. 05/17/16   [provider]  predniSONE (STERAPRED UNI-PAK 21 TAB) 10 MG (21) TBPK tablet Take 6 tablets tomorrow, decrease by 1 each day till finished (6,0,6,3,0,1) 07/26/16   Barnet Glasgow, NP  rosuvastatin (CRESTOR) 10 MG tablet Take 10 mg by mouth daily. 11/28/15   [provider]   Meds Ordered and Administered this Visit  Medications - No data to display  BP 122/67 (BP Location: Right Arm)   Pulse 93   Temp 97.9 F (36.6 C) (Oral)   Resp 20   SpO2 99%  No data found.   Physical Exam  Constitutional: She is oriented to person, place, and time. She appears well-developed and well-nourished. No distress.  HENT:  Head: Normocephalic and atraumatic.  Right Ear: External ear normal.  Left Ear: External ear normal.  Cardiovascular: Normal rate and regular rhythm.   Pulmonary/Chest: Effort normal and breath sounds normal.  Musculoskeletal:       Cervical back: She exhibits  pain and spasm.       Back:       Left hand: She exhibits tenderness and swelling. She exhibits normal range of motion and normal capillary refill.       Hands: Neurological: She is alert and oriented to person, place, and time.  Skin: Skin is warm and dry. Capillary refill takes less than 2 seconds. No rash noted. She is not diaphoretic. No erythema.  Psychiatric: She has a normal mood and affect. Her behavior is normal.  Nursing note and vitals reviewed.   Urgent Care Course     Procedures (including critical care time)  Labs Review Labs Reviewed - No data to display  Imaging Review Dg Hand Complete Left  Result Date: 07/26/2016 CLINICAL DATA:  LEFT hand cramping and pain since yesterday, no known injury EXAM: LEFT HAND - COMPLETE 3+ VIEW COMPARISON:  None FINDINGS: Mild osseous demineralization. Narrowing of STT joint. Prior resection versus osteolysis of the trapezium. No acute fracture, dislocation, or bone destruction. Mild degenerative changes at IP joint thumb. IMPRESSION: Prior resection versus osteolysis of the LEFT trapezium. Osseous demineralization with minimal scattered degenerative changes. No acute abnormalities. Electronically Signed   By: Lavonia Dana M.D.   On: 07/26/2016 14:37      MDM   1. Tennis Must Quervain's tenosynovitis, left   2. Neck pain     Positive Finkelstein test, Negative x-rays for acute fracture. Wrist placed in a thumb spica follow-up with hand for further evaluation and management, also start her on a short course of prednisone. Return to clinic as needed. For neck recommend rest, and ice, declined NSAIDs due to being on blood thinners. Follow-up with primary care as needed    Barnet Glasgow, NP 07/26/16 1856

## 2016-07-26 NOTE — Discharge Instructions (Signed)
We have splinted your wrist, I recommend rest, ice, elevation when possible, started on a prednisone taper, take as directed. If pain persists, follow-up with your orthopedist.

## 2016-07-30 DIAGNOSIS — G894 Chronic pain syndrome: Secondary | ICD-10-CM | POA: Diagnosis not present

## 2016-07-30 DIAGNOSIS — M65842 Other synovitis and tenosynovitis, left hand: Secondary | ICD-10-CM | POA: Diagnosis not present

## 2016-07-30 DIAGNOSIS — M47812 Spondylosis without myelopathy or radiculopathy, cervical region: Secondary | ICD-10-CM | POA: Diagnosis not present

## 2016-07-30 DIAGNOSIS — I209 Angina pectoris, unspecified: Secondary | ICD-10-CM | POA: Diagnosis not present

## 2016-07-30 DIAGNOSIS — M79641 Pain in right hand: Secondary | ICD-10-CM | POA: Diagnosis not present

## 2016-07-30 DIAGNOSIS — Z79891 Long term (current) use of opiate analgesic: Secondary | ICD-10-CM | POA: Diagnosis not present

## 2016-07-30 DIAGNOSIS — M79642 Pain in left hand: Secondary | ICD-10-CM | POA: Diagnosis not present

## 2016-07-30 DIAGNOSIS — M150 Primary generalized (osteo)arthritis: Secondary | ICD-10-CM | POA: Diagnosis not present

## 2016-07-31 ENCOUNTER — Encounter (HOSPITAL_COMMUNITY): Payer: Self-pay | Admitting: *Deleted

## 2016-07-31 ENCOUNTER — Emergency Department (HOSPITAL_COMMUNITY)
Admission: EM | Admit: 2016-07-31 | Discharge: 2016-07-31 | Disposition: A | Discharge status: Home / Self Care | Payer: Medicare Other | Attending: Emergency Medicine | Admitting: Emergency Medicine

## 2016-07-31 DIAGNOSIS — Z8673 Personal history of transient ischemic attack (TIA), and cerebral infarction without residual deficits: Secondary | ICD-10-CM | POA: Insufficient documentation

## 2016-07-31 DIAGNOSIS — Z7982 Long term (current) use of aspirin: Secondary | ICD-10-CM | POA: Diagnosis not present

## 2016-07-31 DIAGNOSIS — F17210 Nicotine dependence, cigarettes, uncomplicated: Secondary | ICD-10-CM | POA: Insufficient documentation

## 2016-07-31 DIAGNOSIS — M25531 Pain in right wrist: Secondary | ICD-10-CM | POA: Diagnosis not present

## 2016-07-31 DIAGNOSIS — Z7902 Long term (current) use of antithrombotics/antiplatelets: Secondary | ICD-10-CM | POA: Diagnosis not present

## 2016-07-31 DIAGNOSIS — Z79899 Other long term (current) drug therapy: Secondary | ICD-10-CM | POA: Insufficient documentation

## 2016-07-31 DIAGNOSIS — I10 Essential (primary) hypertension: Secondary | ICD-10-CM | POA: Diagnosis not present

## 2016-07-31 LAB — BASIC METABOLIC PANEL
ANION GAP: 9 (ref 5–15)
BUN: 13 mg/dL (ref 6–20)
CO2: 25 mmol/L (ref 22–32)
Calcium: 9.3 mg/dL (ref 8.9–10.3)
Chloride: 101 mmol/L (ref 101–111)
Creatinine, Ser: 0.69 mg/dL (ref 0.44–1.00)
GFR calc Af Amer: >60 mL/min (ref >60)
Glucose, Bld: 84 mg/dL (ref 65–99)
POTASSIUM: 3.5 mmol/L (ref 3.5–5.1)
SODIUM: 135 mmol/L (ref 135–145)

## 2016-07-31 LAB — MAGNESIUM: MAGNESIUM: 2.1 mg/dL (ref 1.7–2.4)

## 2016-07-31 MED ORDER — NAPROXEN 250 MG PO TABS
375.0000 mg | ORAL_TABLET | Freq: Once | ORAL | Status: AC
  Administered 2016-07-31: 375 mg via ORAL
  Filled 2016-07-31: qty 2

## 2016-07-31 MED ORDER — OXYCODONE HCL 5 MG PO TABS
5.0000 mg | ORAL_TABLET | Freq: Once | ORAL | Status: AC
  Administered 2016-07-31: 5 mg via ORAL
  Filled 2016-07-31: qty 1

## 2016-07-31 MED ORDER — HYDROMORPHONE HCL 1 MG/ML IJ SOLN
1.0000 mg | Freq: Once | INTRAMUSCULAR | Status: AC
  Administered 2016-07-31: 1 mg via INTRAMUSCULAR
  Filled 2016-07-31: qty 1

## 2016-07-31 NOTE — ED Triage Notes (Signed)
Pt reports recent hx of left wrist pain, has been to hand dr and had steroid inj done. Onset today of right wrist pain, no injury to it.

## 2016-07-31 NOTE — ED Provider Notes (Signed)
[facility] DEPT Provider Note   CSN: [id number] Arrival date & time: 07/31/16  1353     History   Chief Complaint Chief Complaint  Patient presents with  . Wrist Pain    HPI [name] is a 66 y.o. female.  HPI 66 year old Caucasian female past medical history significant for hypertension, prediabetes, presents to the ED today with complaints of right wrist pain. Patient states that she woke up this morning was feeling okay. States that throughout the day she started developing pain in her right wrist that was causing her hand to "drawl up". States the pain radiates up her arm to her elbow. She denies any swelling at this time but states that it was initially large but has gone down. She denies any erythema or ecchymosis. Denies any paresthesias or weakness. He states the same thing happen to her left wrist a few weeks ago. She was seen by her hand doctor who performed wrist steroid injection that completely resolved her pain. Patient was a question that we perform a steroid injection in her right wrist. States that she is on 7.5 mg of oxycodone daily for her back pain. States is not touching her wrist pain. Moving makes the pain worse. Nothing makes the pain better. Past Medical History:  Diagnosis Date  . Arthritis   . Cancer (HCC)    cervical  . COPD (chronic obstructive pulmonary disease) (HCC)    wheezing  . GERD (gastroesophageal reflux disease)   . Hypertension   . Liver disease   . Pre-diabetes   . Seizures ([place])    no meds for 8 months  . Sleep apnea    does not use her cpap  . Stroke [facility]) 2015   no weakness  . 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 Claudication in peripheral vascular disease ([place]) 07/14/2016  . H/O total knee replacement, right 01/12/2016  . Pain in joint, lower leg 05/09/2014  . Convulsion ([place]) 01/17/2014  . Left-sided weakness 01/17/2014  . 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 Left hand weakness 08/24/2012  . CERVICAL CANCER 04/03/2007  . COLONIC POLYPS 04/03/2007  . OVERWEIGHT 04/03/2007  . ANXIETY DEPRESSION 04/03/2007  . TOBACCO ABUSE 04/03/2007  . Essential hypertension 04/03/2007  . INTERNAL HEMORRHOIDS 04/03/2007  . GERD 04/03/2007  . Gastroparesis 04/03/2007  . DIVERTICULOSIS, SIGMOID COLON 04/03/2007  . BACK PAIN, CHRONIC 04/03/2007  . CHEST PAIN, ATYPICAL 04/03/2007    Past Surgical History:  Procedure Laterality Date  . BACK SURGERY    . CESAREAN SECTION    . CHOLECYSTECTOMY    . HAND SURGERY Bilateral    carpal tunnel  . HEMORRHOID SURGERY    . KNEE SURGERY Left    arthroscopy x3  . LOWER EXTREMITY ANGIOGRAPHY N/A 07/16/2016   Procedure: Lower Extremity Angiography;  Surgeon: Adrian Prows, MD;  Location: [place] [facility];  Service: Cardiovascular;  Laterality: N/A;  . NECK SURGERY    . OTHER SURGICAL HISTORY  2013   bladder stimulator in back  . TENNIS ELBOW RELEASE/NIRSCHEL PROCEDURE Right 12/2015  . TOE SURGERY Right    right foot second and fourth toe  . TOTAL HIP ARTHROPLASTY Left   . TOTAL KNEE ARTHROPLASTY Right 01/12/2016   Procedure: RIGHT TOTAL KNEE ARTHROPLASTY;  Surgeon: Netta Cedars, MD;  Location: [place];  Service: Orthopedics;  Laterality: Right;  . TUBAL LIGATION    . VAGINAL HYSTERECTOMY  OB History    No data available       Home Medications    Prior to Admission medications   Medication Sig Start Date End Date Taking? Authorizing Provider  albuterol (PROVENTIL HFA;VENTOLIN HFA) 108 (90 BASE) MCG/ACT inhaler Inhale 2 puffs into the lungs every 4 (four) hours as needed for wheezing or shortness of breath (cough). 11/10/14   Pisciotta, Elmyra Ricks, PA-C  aspirin EC 81 MG tablet Take 81 mg by mouth daily.    [provider]  buPROPion (WELLBUTRIN XL) 150 MG 24 hr tablet Take 150-300 mg by mouth See admin instructions. TAKE 1 TABLET BY MOUTH IN THE MORNING & AT 4PM. START WITH 1 DAILY FOR 3  DAYS THEN INCREASE TO 2. 05/17/16   [provider]  clopidogrel (PLAVIX) 75 MG tablet Take 75 mg by mouth daily after breakfast. 06/26/16   [provider]  diltiazem (CARDIZEM CD) 240 MG 24 hr capsule Take 240 mg by mouth daily after breakfast. 06/12/16   [provider]  docusate sodium (COLACE) 100 MG capsule Take 200 mg by mouth daily after breakfast.    [provider]  DULoxetine (CYMBALTA) 60 MG capsule Take 60 mg by mouth daily after breakfast.     [provider]  hydrochlorothiazide (HYDRODIURIL) 12.5 MG tablet Take 12.5 mg by mouth daily after breakfast. 06/18/16   [provider]  lansoprazole (PREVACID 24HR) 15 MG capsule Take 15 mg by mouth daily as needed (for acid reflux).    [provider]  losartan (COZAAR) 100 MG tablet Take 100 mg by mouth daily after breakfast. 06/27/16   [provider]  methocarbamol (ROBAXIN) 500 MG tablet Take 1 tablet (500 mg total) by mouth 3 (three) times daily as needed. 01/12/16   Netta Cedars, MD  nitroGLYCERIN (NITROSTAT) 0.4 MG SL tablet Place 0.4 mg under the tongue every 5 (five) minutes x 3 doses as needed. For chest pain. 05/17/16   [provider]  oxyCODONE-acetaminophen (PERCOCET) 7.5-325 MG tablet Take 1-2 tablets by mouth every 4 (four) hours as needed for severe pain. 01/12/16   Netta Cedars, MD  predniSONE (STERAPRED UNI-PAK 21 TAB) 10 MG (21) TBPK tablet Take 6 tablets tomorrow, decrease by 1 each day till finished (6,5,4,3,2,1) 07/26/16   Barnet Glasgow, NP  rOPINIRole (REQUIP) 1 MG tablet Take 1 mg by mouth 4 (four) times daily.     [provider]  rosuvastatin (CRESTOR) 10 MG tablet Take 10 mg by mouth daily. 11/28/15   [provider]    Family History Family History  Problem Relation Age of Onset  . Hyperlipidemia Mother   . Hypertension Mother   . Hyperlipidemia Father   . Hypertension Father   . Alzheimer's disease Father   .  Stroke Brother   . Cancer Daughter        unknown    Social History Social History  Substance Use Topics  . Smoking status: Current Every Day Smoker    Packs/day: 1.50    Years: 53.00  . Smokeless tobacco: Never Used     Comment: DOWN TO 1.5PACK DAY  . Alcohol use No     Allergies   Bee venom; Codeine; Morphine and related; and Sulfonamide derivatives   Review of Systems Review of Systems  Constitutional: Negative for chills and fever.  Gastrointestinal: Negative for nausea and vomiting.  Musculoskeletal: Positive for arthralgias, joint swelling and myalgias.  Skin: Negative for color change.  Neurological: Negative for weakness and numbness.  Physical Exam Updated Vital Signs BP (!) 157/71 (BP Location: Left Arm)   Pulse 92   Temp 99 F (37.2 C) (Oral)   Resp 16   SpO2 99%   Physical Exam  Constitutional: She appears well-developed and well-nourished. No distress.  HENT:  Head: Normocephalic and atraumatic.  Eyes: Right eye exhibits no discharge. Left eye exhibits no discharge. No scleral icterus.  Neck: Normal range of motion.  Cardiovascular: Intact distal pulses.   Pulmonary/Chest: No respiratory distress.  Musculoskeletal: Normal range of motion.  Patient is tender to palpation of the right wrist. There is no obvious edema noted. No ecchymosis. No erythema or warmth. Patient with limited range of motion of her right wrist and right fingers due to the pain. Pain with flexion of MC joints. Cap refill is normal. Radial pulses 2+ bilaterally. Sensation intact. Full range of motion the right elbow without pain.  Neurological: She is alert.  Skin: Skin is warm and dry. Capillary refill takes less than 2 seconds. No pallor.  Psychiatric: Her behavior is normal. Judgment and thought content normal.  Nursing note and vitals reviewed.    ED Treatments / Results  Labs (all labs ordered are listed, but only abnormal results are displayed) Labs Reviewed  BASIC  METABOLIC PANEL  MAGNESIUM    EKG  EKG Interpretation None       Radiology No results found.  Procedures Procedures (including critical care time)  Medications Ordered in ED Medications  oxyCODONE (Oxy IR/ROXICODONE) immediate release tablet 5 mg (5 mg Oral Given 07/31/16 1538)  naproxen (NAPROSYN) tablet 375 mg (375 mg Oral Given 07/31/16 1656)  HYDROmorphone (DILAUDID) injection 1 mg (1 mg Intramuscular Given 07/31/16 1700)     Initial Impression / Assessment and Plan / ED Course  I have reviewed the triage vital signs and the nursing notes.  Pertinent labs & imaging results that were available during my care of the patient were reviewed by me and considered in my medical decision making (see chart for details).     Patient presents to the ED with complaints of pain in her right wrist. States that she had this in the left wrist a few weeks ago that was relieved by a steroid injection by the hand surgeon. Patient denies any associated symptoms including erythema, warmth, paresthesias, weakness, fever. No signs of septic joint. She is neurovascularly intact. Patient felt like her electrolytes might be low. Labs were checked. Normal magnesium, potassium, calcium. Patient was given her home dose of oxycodone with no relief in her pain. She does not want x-ray of her wrist because she knows it will not show anything. She wants an injection in her wrist which I state we do not provide here. Patient was assessed by Dr. Wilson Singer who ordered IM Dilaudid for patient's pain. Patient needs close follow-up with her hand surgeon. We'll need to call today or tomorrow for an appointment. Patient encouraged to return to the ED if she develops any fevers, redness in the area, swelling.  Pt is hemodynamically stable, in NAD, & able to ambulate in the ED. Evaluation does not show pathology that would require ongoing emergent intervention or inpatient treatment. I explained the diagnosis to the patient.  Pain has been managed & has no complaints prior to dc. Pt is comfortable with above plan and is stable for discharge at this time. All questions were answered prior to disposition. Strict return precautions for f/u to the ED were discussed.   Final Clinical Impressions(s) / ED  Diagnoses   Final diagnoses:  Right wrist pain    New Prescriptions New Prescriptions   No medications on file     Aaron Edelman 07/31/16 1715    Virgel Manifold, MD 08/10/16 [phone]

## 2016-07-31 NOTE — ED Notes (Signed)
Ice pack given to patient for comfort  

## 2016-07-31 NOTE — Discharge Instructions (Signed)
Your blood work was normal. Her electrolytes were normal. It is important that she follow up with your hand doctor. Take your medication that she was prescribed for pain at home. Return to the ED if you develop any redness around the joint, fevers, worsening pain.

## 2016-08-02 ENCOUNTER — Other Ambulatory Visit: Payer: Self-pay | Admitting: *Deleted

## 2016-08-02 DIAGNOSIS — I739 Peripheral vascular disease, unspecified: Secondary | ICD-10-CM

## 2016-08-02 DIAGNOSIS — Z006 Encounter for examination for normal comparison and control in clinical research program: Secondary | ICD-10-CM

## 2016-08-05 ENCOUNTER — Encounter: Payer: Self-pay | Admitting: Neurology

## 2016-08-05 ENCOUNTER — Ambulatory Visit (INDEPENDENT_AMBULATORY_CARE_PROVIDER_SITE_OTHER): Payer: Medicare Other | Admitting: Neurology

## 2016-08-05 ENCOUNTER — Ambulatory Visit (HOSPITAL_COMMUNITY)
Admission: RE | Admit: 2016-08-05 | Discharge: 2016-08-05 | Disposition: A | Discharge status: Home / Self Care | Payer: Medicare Other | Source: Ambulatory Visit | Attending: Neurology | Admitting: Neurology

## 2016-08-05 ENCOUNTER — Other Ambulatory Visit (INDEPENDENT_AMBULATORY_CARE_PROVIDER_SITE_OTHER): Payer: Medicare Other

## 2016-08-05 ENCOUNTER — Encounter: Payer: Self-pay | Admitting: *Deleted

## 2016-08-05 VITALS — BP 130/70 | HR 103 | Ht 62.0 in | Wt 202.2 lb

## 2016-08-05 DIAGNOSIS — Z006 Encounter for examination for normal comparison and control in clinical research program: Secondary | ICD-10-CM

## 2016-08-05 DIAGNOSIS — R7303 Prediabetes: Secondary | ICD-10-CM | POA: Insufficient documentation

## 2016-08-05 DIAGNOSIS — M2550 Pain in unspecified joint: Secondary | ICD-10-CM

## 2016-08-05 DIAGNOSIS — I10 Essential (primary) hypertension: Secondary | ICD-10-CM | POA: Insufficient documentation

## 2016-08-05 DIAGNOSIS — M542 Cervicalgia: Secondary | ICD-10-CM | POA: Diagnosis not present

## 2016-08-05 DIAGNOSIS — I739 Peripheral vascular disease, unspecified: Secondary | ICD-10-CM

## 2016-08-05 LAB — BUN: BUN: 12 mg/dL (ref 6–23)

## 2016-08-05 LAB — SEDIMENTATION RATE: SED RATE: 58 mm/h — AB (ref 0–30)

## 2016-08-05 LAB — CREATININE, SERUM: Creatinine, Ser: 0.68 mg/dL (ref 0.40–1.20)

## 2016-08-05 NOTE — Patient Instructions (Addendum)
1. Records from Dr. Amedeo Plenty will be requested for review 2. Schedule CT cervical spine with and without contrast 3. Bloodwork for ESR, RF, ANA, BUN, creatinine 4. If CT neck is unremarkable, we will plan for a nerve test 5. Follow-up in 4-20month, call for any changes

## 2016-08-05 NOTE — Progress Notes (Signed)
Screening/Baseline: ?  7-Month: ?   54-Month: ?  Unscheduled Visit: ?    6-Minute Walk Test    Date: 08/05/2016 Time: 1548 pm   SOB before test ?0 ?0.5 ?1 ?2 ?3 ?4 ?5 ?6 ?7 ?8 ?9 ?10  Fatigue before test ?0 ?0.5 ?1 ?2 ?3 ?4 ?5 ?6 ?7 ?8 ?9 ?10  Time Stopped: 1554 pm Distance Walked: 600 ft   SOB after test ?0 ?0.5 ?1 ?2 ?3 ?4 ?5 ?6 ?7 ?8 ?9 ?10  Fatigue after test ?0 ?0.5 ?1 ?2 ?3 ?4 ?5 ?6 ?7 ?8 ?9 ?10       Coordinator: Philemon Kingdom :)  Date: 08/05/2016    PI Signature: see EPIC note for co-sign and dated  [place] The following questions refer to blockages in the arteries of your body, particularly your legs, and how that might affect your life. Please read and complete the following questions. There are no right or wrong answers. Please mark the answer that best applies to you.  1. Blockages in the arteries, often referred to as peripheral vascular disease, affect different people in different ways. Some feel cramping or aching while others feel fatigue. Which leg (or buttock) causes you the most severe discomfort, fatigue, pain, aching, or cramps? The RIGHT leg (buttock) the LEFT leg (buttocks) Both are the same Neither  ?    ?    ?       ?  2. Please review the list below and indicate how much limitation you have due to your peripheral vascular disease (discomfort, fatigue, pain, aching, or cramps in your calves (or buttocks)) over the past 4 weeks.   Place an X in one box on each line  Activity Extremely Limited Quite a bit Limited Moderately Limited Slightly Limited Not at all  Limited Limited for other reasons or did not do the activity.  Walking around your home ? ? ? ? ? ?  Walking 1-2 blocks on level ground ? ? ? ? ? ?  Walking 1-2 blocks up a hill ? ? ? ? ? ?  Walking 3-4 blocks on level ground ? ? ? ? ? ?  Hurrying or jogging (as if to catch a bus) ? ? ? ? ? ?  Vigorous work or exercise ? ? ? ? ? ?          3. Compared  with 4 weeks ago, have your symptoms of peripheral vascular disease (discomfort, fatigue, pain, aching, or cramps in your calves (or buttocks) changed? My symptoms have become.  Much Worse Slightly worse Not changed Slightly better Much Better I have had no symptoms over the past 4 weeks  ? ? ? ? ? ?         4. Over the past 4 weeks, how many times did you have discomfort, fatigue, pain, aching, or cramps in your calves (or buttocks)? All of the time Several times per day At least once a day 3 or more times per weeks but not every day 1-2 times per week Less than once a week Never over the past 4 weeks  ? ? ? ? ? ? ?     5. Over the past 4 weeks, how many times did you have discomfort, fatigue, pain, aching, or cramps in your calves (or buttocks) bothered you? Extremely bothersome Moderately bothersome Somewhat bothersome Slightly bothersome Not at all bothersome I've had no leg discomfort  ? ? ? ? ? ?  6. Over the past 4 weeks, how often have you been awakened with pain, aching, or cramps in your legs or feet? Every night 3 or more times per week but not every night 1-2 times per week Less than once a week  Never over the past 4 weeks   ? ? ? ? ?   7. How satisfied are you that everything possible is being done to treat your peripheral vascular disease? Not satisfied at all  Mostly dissatisfied Somewhat satisfied Mostly satisfied Completely satisfied  ? ?  ?  ?  ?    8. How satisfied are you with the explanations your doctor has given you about your peripheral vascular disease? Not satisfied at all  Mostly dissatisfied Somewhat satisfied Mostly satisfied Completely satisfied  ? ?  ?  ?  ?    9. Overall, how satisfied are you with the current treatment of your peripheral vascular disease? Not satisfied at all  Mostly dissatisfied Somewhat satisfied Mostly satisfied Completely satisfied  ? ?  ?  ?  ?    10. Over the past 4 weeks, how much has your peripheral vascular disease limited your  enjoyment of life? It has extremely limited my enjoyment of life It has limited my enjoyment quite a bit  It has moderately limited my enjoyment of life It has slightly limited my enjoyment of life It has not limited my enjoyment of life at all   ? ? ? ? ?   71. If you had to spend the rest of your life with your peripheral vascular disease the way it is right now, how would you feel about this? Not satisfied at all  Mostly dissatisfied Somewhat satisfied Mostly satisfied Completely satisfied  ? ?  ?  ?  ?    12. Over the past 4 weeks, how often have you felt discouraged or down in the dumps because of your peripheral vascular disease? I felt that way all of the time I felt that way most of the time I occasionally felt that way  I rarely felt that way I never felt that way  ? ? ? ? ?   13. How much does your peripheral vascular disease affect your lifestyle? Please indicate how your discomfort, fatigue, pain, aching, or cramps in your calves (or buttocks) may have limited your participation in the following activities over the past 4 weeks.   Please place an X in one box on each line Activity Severely limited Limited quite a bit Moderately limited Slightly limited Did not limit at all Does not apply or did not do for other reasons  Hobbies, recreational activities ? ? ? ? ? ?  Visiting family or friends out of your home ? ? ? ? ? ?  Working or doing household chores ? ? ? ? ? ?

## 2016-08-05 NOTE — Progress Notes (Signed)
VASCULAR LAB PRELIMINARY  ARTERIAL  ABI completed:    RIGHT    LEFT    PRESSURE WAVEFORM  PRESSURE WAVEFORM  BRACHIAL 151 Triphasic BRACHIAL 162 Triphasic  DP 110 Monophasic DP 122 Biphasic  AT   AT    PT 125 Monophasic PT 125 Triphasic  PER   PER    GREAT TOE 55 NA GREAT TOE 9 NA    RIGHT LEFT  ABI 0.77 0.77  TBI 0.34 0.06   Bilateral ABIs are suggestive of moderate arterial insufficiency at rest. Bilateral TBIs are abnormal.  08/05/2016 3:52 PM Maudry Mayhew, BS, RVT, RDCS, RDMS

## 2016-08-05 NOTE — Progress Notes (Signed)
NEUROLOGY FOLLOW UP OFFICE NOTE  [name] [id number]  HISTORY OF PRESENT ILLNESS: I had the pleasure of seeing [name] in follow-up in the neurology clinic on 08/05/2016.  The patient was last seen 2 years ago for tremors and falls. She has a history of an abnormal EEG, and after an episode of confusion, worsening left face and arm weakness, Keppra was restarted in 2014. She started having episodes of she loses feeling in her legs and would fall, no loss of consciousness but she feels she cannot speak. A 48-hour EEG done was normal, no epileptiform discharges/electrographic seizures, however typical events were not captured. She stopped the [place] in 2016 and has not had any issues relating to this since then. She presents today at the recommendation of her hand surgeon concerned that new symptoms are due to cervical spine etiology.   She was in the ER on 07/26/16 after a day of significant pin on her left hand, arm, and left side of her neck. She denied any recent falls or heavy lifting. Pain was on the base of her 5th digit, then she noticed a bump on the dorsum of her left wrist. She was diagnosed with tendinitis and saw Dr. Amedeo Plenty, who gave her a cortisone shot. The next day, she felt wonderful with 0/10 pain. Five days later, she started having similar pain on her right hand, with a lot of cramping causing her fingers to flex. She had pain at the base of her thumb, on the dorsum of her right wrist, and the base of her fingers. She got IV Dilaudid which did help. She saw Dr. Amedeo Plenty again and was told to see Neurology because of the neck symptoms. She feels like there is a crick on the left side of her neck, radiating to the right side. She feels sore in the bilateral occipital regions, and feels like there is a lump in this region. She denies any numbness/tingling or shooting pain. She feels fine today, just tender in the occipital regions. She is concerned because she has chronic pain  and has dealt with a lot of pain issues, but the pain in her hands made her cry. She is under a lot of stress. She wakes up feeling fine, then feels zapped of energy in the afternoon. She has been seeing cardiologist Dr. Einar Gip and is scheduled for lower extremity angiogram and potential stenting for "right leg blockages."   HPI December 2015: This is a 66 yo RH woman with a history of hypertension, chronic pain, RLS, who started having tremors in 2013 affecting both upper and lower extremities, usually triggered by lack of sleep. She reports she would shake, "like muscle spasms." She had a routine EEG which was reported as abnormal, with a 55 second period of rhythmic sharp waves and spike and wave discharges, in delta and theta ranges, in the left temporal region. Phase reversal noted at T3 electrode. She was started on Lamictal but did not tolerate higher doses and was discontinued in 2014. She had an episode of left face and arm weakness, confusion last August 2014, there is note of possible psychogenic event, however due to prior abnormal EEG, she was started on Keppra 518m BID. Dose was increased due to patient report of zoning out, staring, and inability to speak. She self-reduced dose to 5055mBID due to occipital headaches/tenderness.   She reports that episodes now start with a wave going through her body which would drain her. This would be followed by  losing feeling in one or both legs causing her to fall. After around 5 minutes, she can stand. She denies any associated leg or back pain. Last episode was before Christmas. She has the same sensation if she sits on the commode too long. If she stands too long, her legs would start to hurt. She reports the tremors still continue to occur, affecting her hands. She feels she cannot speak but can hear people around her, denying any confusion. She reports that when she goes to the dentist, she has similar tremors. No significant change in episodes on  Keppra. She reports feeling anxious all the time, and does tremble when scared.   Her half-brother has seizures. She had a concussion with brief loss of consciousness at age 66 or 66. Otherwise she had a normal birth and early development. There is no history of febrile convulsions, CNS infections such as meningitis/encephalitis, significant traumatic brain injury, neurosurgical procedures.  I personally reviewed head CT done 08/2012 which did not show any acute changes, there was patchy small vessel disease changes in the centrum semiovale bilaterally  PAST MEDICAL HISTORY: Past Medical History:  Diagnosis Date  . Arthritis   . Cancer (HCC)    cervical  . COPD (chronic obstructive pulmonary disease) (HCC)    wheezing  . GERD (gastroesophageal reflux disease)   . Hypertension   . Liver disease   . Pre-diabetes   . Seizures ([place])    no meds for 8 months  . Sleep apnea    does not use her cpap  . Stroke [facility]) 2015   no weakness  . 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 albuterol (PROVENTIL HFA;VENTOLIN HFA) 108 (90 BASE) MCG/ACT inhaler Inhale 2 puffs into the lungs every 4 (four) hours as needed for wheezing or shortness of breath (cough). 1 Inhaler 0  . aspirin 81 MG chewable tablet Chew 81 mg by mouth daily.    Marland Kitchen aspirin-sod bicarb-citric acid (ALKA-SELTZER) 325 MG TBEF tablet Take 650 mg by mouth 3 (three) times daily after meals.    Marland Kitchen buPROPion (WELLBUTRIN XL) 150 MG 24 hr tablet Take 150-300 mg by mouth See admin instructions. TAKE 1 TABLET BY MOUTH IN THE MORNING & AT 4PM. START WITH 1 DAILY FOR 3 DAYS THEN INCREASE TO 2.  2  . calcium carbonate (TUMS EX) 750 MG chewable tablet Chew 2-4 tablets by mouth as needed for heartburn.    . clopidogrel (PLAVIX) 75 MG tablet Take 75 mg by mouth daily after breakfast.  3  . diltiazem (CARDIZEM CD) 240 MG 24 hr capsule Take 240 mg by  mouth daily after breakfast.  1  . docusate sodium (COLACE) 100 MG capsule Take 200 mg by mouth daily after breakfast.    . DULoxetine (CYMBALTA) 60 MG capsule Take 60 mg by mouth daily after breakfast.     . hydrochlorothiazide (HYDRODIURIL) 12.5 MG tablet Take 12.5 mg by mouth daily after breakfast.  0  . lansoprazole (PREVACID 24HR) 15 MG capsule Take 15 mg by mouth daily as needed (for acid reflux).    Marland Kitchen losartan (COZAAR) 100 MG tablet Take 100 mg by mouth daily after breakfast.  0  . methocarbamol (ROBAXIN) 500 MG tablet Take 1 tablet (500 mg total) by mouth 3 (three) times daily as needed. (Patient taking differently: Take 500 mg by mouth every 6 (six) hours as needed for muscle  spasms. ) 60 tablet 1  . nitroGLYCERIN (NITROSTAT) 0.4 MG SL tablet Place 0.4 mg under the tongue every 5 (five) minutes x 3 doses as needed. For chest pain.  1  . OVER THE COUNTER MEDICATION Apply 1 application topically as needed (joint pain). Magnilife topicall muscle rub    . OVER THE COUNTER MEDICATION Apply 1 application topically daily as needed (leg pain). Horse ligament otc muscle rub    . oxyCODONE-acetaminophen (PERCOCET) 7.5-325 MG tablet Take 1-2 tablets by mouth every 4 (four) hours as needed for severe pain. (Patient taking differently: Take 1-2 tablets by mouth every 4 (four) hours. ) 60 tablet 0  . predniSONE (STERAPRED UNI-PAK 21 TAB) 10 MG (21) TBPK tablet Take 6 tablets tomorrow, decrease by 1 each day till finished (6,5,4,3,2,1) (Patient not taking: Reported on 08/02/2016) 21 tablet 0  . rOPINIRole (REQUIP) 1 MG tablet Take 1 mg by mouth 4 (four) times daily.     . rosuvastatin (CRESTOR) 10 MG tablet Take 10 mg by mouth daily.  3  . Tolnaftate (ABSORBINE JR EX) Apply 1 application topically as needed (knee pain).    Marland Kitchen trolamine salicylate (ASPERCREME) 10 % cream Apply 1 application topically as needed (hand and neck pain).     No current facility-administered medications on file prior to visit.      ALLERGIES: Allergies  Allergen Reactions  . Bee Venom Anaphylaxis  . Codeine Hives  . Adhesive [Tape]     Redness, Paper tape is ok  . Morphine And Related Other (See Comments)    Overly sedated  . Sulfonamide Derivatives     UNSPECIFIED REACTION OF CHILDHOOD     FAMILY HISTORY: Family History  Problem Relation Age of Onset  . Hyperlipidemia Mother   . Hypertension Mother   . Hyperlipidemia Father   . Hypertension Father   . Alzheimer's disease Father   . Stroke Brother   . Cancer Daughter        unknown    SOCIAL HISTORY: Social History   Social History  . Marital status: Married    Spouse name: N/A  . Number of children: N/A  . Years of education: N/A   Occupational History  . Not on file.   Social History Main Topics  . Smoking status: Current Every Day Smoker    Packs/day: 1.50    Years: 53.00  . Smokeless tobacco: Never Used     Comment: DOWN TO 1.5PACK DAY  . Alcohol use No  . Drug use: No  . Sexual activity: No   Other Topics Concern  . Not on file   Social History Narrative  . No narrative on file    REVIEW OF SYSTEMS: Constitutional: No fevers, chills, or sweats, no generalized fatigue, change in appetite Eyes: No visual changes, double vision, eye pain Ear, nose and throat: No hearing loss, ear pain, nasal congestion, sore throat Cardiovascular: No chest pain, palpitations Respiratory:  No shortness of breath at rest or with exertion, wheezes GastrointestinaI: No nausea, vomiting, diarrhea, abdominal pain, fecal incontinence Genitourinary:  No dysuria, urinary retention or frequency Musculoskeletal:  + neck pain, back pain; leg pain Integumentary: No rash, pruritus, skin lesions Neurological: as above Psychiatric: No depression, insomnia, anxiety Endocrine: No palpitations, fatigue, diaphoresis, mood swings, change in appetite, change in weight, increased thirst Hematologic/Lymphatic:  No anemia, purpura,  petechiae. Allergic/Immunologic: no itchy/runny eyes, nasal congestion, recent allergic reactions, rashes  PHYSICAL EXAM: Vitals:   08/05/16 1126  BP: 130/70  Pulse: Marland Kitchen)  103   General: No acute distress Head:  Normocephalic/atraumatic, soreness on left occipital palpation Neck: supple, +paraspinal tenderness, full range of motion Heart:  Regular rate and rhythm Lungs:  Clear to auscultation bilaterally Back: No paraspinal tenderness Skin/Extremities: No rash, no edema Neurological Exam: alert and oriented to person, place, and time, no dysarthria or aphasia, Fund of knowledge is appropriate. Recent and remote memory are intact. Attention and concentration are normal. Able to name objects and repeat phrases. Cranial nerves: CN I: not tested CN II: pupils equal, round and reactive to light, visual fields intact CN III, IV, VI: full range of motion, no nystagmus, no ptosis CN V: facial sensation intact CN VII: upper and lower face symmetric CN VIII: hearing intact to finger rub CN IX, X: gag intact, uvula midline CN XI: sternocleidomastoid and trapezius muscles intact CN XII: tongue midline Bulk & Tone: normal, no cogwheeling, no fasciculations. Motor: 4+/5 on both proximal UE with some give way weakness, otherwise 5/5.  Sensation: intact to light touch, cold, pin. No extinction to double simultaneous stimulation. Romberg test negative Deep Tendon Reflexes: +2 on both UE, +1 on both LE, absent ankle jerks bilaterally, no ankle clonus Plantar responses: downgoing bilaterally Cerebellar: no incoordination on finger to nose testing. No dysdiadochokinesia Gait: narrow-based and steady, mild difficulty with tandem walk but able Tremor: mild tremor on right hand with action, no resting tremor, no postural tremor  IMPRESSION: This is a 66 yo RH woman with hypertension, chronic pain, RLS, who I had initially been seeing for tremors. She had a prior abnormal routine EEG reporting  left temporal epileptiform discharges. She had been taking. She had a 48-hour EEG which was normal, however typical events were not captured, she stopped Keppra in 2016 and denies recurrence of these symptoms, presenting today with new symptoms of bilateral hand pain. She was also reporting neck pain radiating down her arms and dropping things, and tells me she was told by her hand surgeon to see Neurology. Records from Dr. Amedeo Plenty were reviewed, he mentioned she needs to see her PCP and did not mention Neuro on his note. We discussed her symptoms, with migrating joint pain, would check for causes of arthritis, check ESR, rheumatoid factor, ANA. We discussed neck pain, there is mild bilateral proximal arm weakness with some give way, CT cervical spine with and without contrast will be ordered to assess for underlying structural abnormality. She is unable to get an MRI due to stimulator. If CT cervical spine unremarkable, consideration for EMG/NCV of both UE will be done. She may benefit from seeing Rheumatology, and will discuss this with her new PCP. She is also concerned about hot flashes. She is scheduled for an arteriogram/stent of the lower extremity tomorrow, hopefully this will help with leg pain. She will follow-up in 4-5 months and knows to call for any changes.   Thank you for allowing me to participate in her care.  Please do not hesitate to call for any questions or concerns.  The duration of this appointment visit was 25 minutes of face-to-face time with the patient.  Greater than 50% of this time was spent in counseling, explanation of diagnosis, planning of further management, and coordination of care.   [name], M.D.

## 2016-08-06 ENCOUNTER — Encounter (HOSPITAL_COMMUNITY): Payer: Self-pay | Admitting: Cardiology

## 2016-08-06 ENCOUNTER — Encounter (HOSPITAL_COMMUNITY)
Admission: RE | Disposition: A | Discharge status: Home / Self Care | Payer: Self-pay | Source: Ambulatory Visit | Attending: Cardiology

## 2016-08-06 ENCOUNTER — Ambulatory Visit (HOSPITAL_COMMUNITY)
Admission: RE | Admit: 2016-08-06 | Discharge: 2016-08-06 | Disposition: A | Discharge status: Home / Self Care | Payer: Medicare Other | Source: Ambulatory Visit | Attending: Cardiology | Admitting: Cardiology

## 2016-08-06 DIAGNOSIS — I70211 Atherosclerosis of native arteries of extremities with intermittent claudication, right leg: Secondary | ICD-10-CM | POA: Insufficient documentation

## 2016-08-06 DIAGNOSIS — F17210 Nicotine dependence, cigarettes, uncomplicated: Secondary | ICD-10-CM | POA: Diagnosis not present

## 2016-08-06 DIAGNOSIS — R7303 Prediabetes: Secondary | ICD-10-CM | POA: Diagnosis not present

## 2016-08-06 DIAGNOSIS — E781 Pure hyperglyceridemia: Secondary | ICD-10-CM | POA: Diagnosis not present

## 2016-08-06 DIAGNOSIS — Z7982 Long term (current) use of aspirin: Secondary | ICD-10-CM | POA: Insufficient documentation

## 2016-08-06 DIAGNOSIS — I739 Peripheral vascular disease, unspecified: Secondary | ICD-10-CM | POA: Diagnosis present

## 2016-08-06 DIAGNOSIS — I340 Nonrheumatic mitral (valve) insufficiency: Secondary | ICD-10-CM | POA: Diagnosis not present

## 2016-08-06 DIAGNOSIS — I6523 Occlusion and stenosis of bilateral carotid arteries: Secondary | ICD-10-CM | POA: Insufficient documentation

## 2016-08-06 DIAGNOSIS — E785 Hyperlipidemia, unspecified: Secondary | ICD-10-CM | POA: Insufficient documentation

## 2016-08-06 DIAGNOSIS — I10 Essential (primary) hypertension: Secondary | ICD-10-CM | POA: Insufficient documentation

## 2016-08-06 HISTORY — PX: PERIPHERAL VASCULAR BALLOON ANGIOPLASTY: CATH118281

## 2016-08-06 HISTORY — PX: LOWER EXTREMITY INTERVENTION: CATH118252

## 2016-08-06 LAB — COMPREHENSIVE METABOLIC PANEL
ALT: 22 U/L (ref 14–54)
ANION GAP: 6 (ref 5–15)
AST: 17 U/L (ref 15–41)
Albumin: 3.3 g/dL — ABNORMAL LOW (ref 3.5–5.0)
Alkaline Phosphatase: 112 U/L (ref 38–126)
BUN: 12 mg/dL (ref 6–20)
CHLORIDE: 101 mmol/L (ref 101–111)
CO2: 29 mmol/L (ref 22–32)
CREATININE: 0.8 mg/dL (ref 0.44–1.00)
Calcium: 8.7 mg/dL — ABNORMAL LOW (ref 8.9–10.3)
Glucose, Bld: 104 mg/dL — ABNORMAL HIGH (ref 65–99)
Potassium: 3.4 mmol/L — ABNORMAL LOW (ref 3.5–5.1)
SODIUM: 136 mmol/L (ref 135–145)
Total Bilirubin: 0.3 mg/dL (ref 0.3–1.2)
Total Protein: 6.2 g/dL — ABNORMAL LOW (ref 6.5–8.1)

## 2016-08-06 LAB — ANTI-NUCLEAR AB-TITER (ANA TITER)

## 2016-08-06 LAB — POCT ACTIVATED CLOTTING TIME
ACTIVATED CLOTTING TIME: 268 s
ACTIVATED CLOTTING TIME: 257 s

## 2016-08-06 LAB — RHEUMATOID FACTOR

## 2016-08-06 LAB — ANA: ANA: POSITIVE — AB

## 2016-08-06 SURGERY — LOWER EXTREMITY INTERVENTION
Anesthesia: LOCAL

## 2016-08-06 MED ORDER — HEPARIN (PORCINE) IN NACL 2-0.9 UNIT/ML-% IJ SOLN
INTRAMUSCULAR | Status: AC | PRN
  Administered 2016-08-06: 1000 mL

## 2016-08-06 MED ORDER — ACETAMINOPHEN 325 MG RE SUPP
325.0000 mg | RECTAL | Status: DC | PRN
  Filled 2016-08-06: qty 2

## 2016-08-06 MED ORDER — ASPIRIN 81 MG PO CHEW
81.0000 mg | CHEWABLE_TABLET | ORAL | Status: AC
  Administered 2016-08-06: 81 mg via ORAL

## 2016-08-06 MED ORDER — HEPARIN SODIUM (PORCINE) 1000 UNIT/ML IJ SOLN
INTRAMUSCULAR | Status: AC
  Filled 2016-08-06: qty 1

## 2016-08-06 MED ORDER — SODIUM CHLORIDE 0.9% FLUSH: 3.0000 mL | Freq: Two times a day (BID) | INTRAVENOUS | Status: DC

## 2016-08-06 MED ORDER — SODIUM CHLORIDE 0.9 % IV SOLN: 250.0000 mL | INTRAVENOUS | Status: DC | PRN

## 2016-08-06 MED ORDER — IODIXANOL 320 MG/ML IV SOLN
INTRAVENOUS | Status: DC | PRN
  Administered 2016-08-06: 125 mL via INTRA_ARTERIAL

## 2016-08-06 MED ORDER — SODIUM CHLORIDE 0.9 % WEIGHT BASED INFUSION: 1.0000 mL/kg/h | INTRAVENOUS | Status: DC

## 2016-08-06 MED ORDER — FENTANYL CITRATE (PF) 100 MCG/2ML IJ SOLN
INTRAMUSCULAR | Status: AC
  Filled 2016-08-06: qty 2

## 2016-08-06 MED ORDER — CLOPIDOGREL BISULFATE 75 MG PO TABS
ORAL_TABLET | ORAL | Status: AC
  Administered 2016-08-06: 75 mg via ORAL
  Filled 2016-08-06: qty 1

## 2016-08-06 MED ORDER — LIDOCAINE HCL (PF) 1 % IJ SOLN
INTRAMUSCULAR | Status: AC
  Filled 2016-08-06: qty 30

## 2016-08-06 MED ORDER — MIDAZOLAM HCL 2 MG/2ML IJ SOLN
INTRAMUSCULAR | Status: DC | PRN
  Administered 2016-08-06 (×2): 1 mg via INTRAVENOUS

## 2016-08-06 MED ORDER — HEPARIN SODIUM (PORCINE) 1000 UNIT/ML IJ SOLN
INTRAMUSCULAR | Status: DC | PRN
  Administered 2016-08-06: 6000 [IU] via INTRAVENOUS
  Administered 2016-08-06: 1500 [IU] via INTRAVENOUS

## 2016-08-06 MED ORDER — FENTANYL CITRATE (PF) 100 MCG/2ML IJ SOLN
INTRAMUSCULAR | Status: DC | PRN
  Administered 2016-08-06: 50 ug via INTRAVENOUS

## 2016-08-06 MED ORDER — ASPIRIN 81 MG PO CHEW
CHEWABLE_TABLET | ORAL | Status: AC
  Administered 2016-08-06: 81 mg via ORAL
  Filled 2016-08-06: qty 1

## 2016-08-06 MED ORDER — CEFAZOLIN SODIUM-DEXTROSE 2-4 GM/100ML-% IV SOLN
INTRAVENOUS | Status: AC
  Filled 2016-08-06: qty 100

## 2016-08-06 MED ORDER — SODIUM CHLORIDE 0.9 % IV SOLN: 1.0000 mL/kg/h | INTRAVENOUS | Status: DC

## 2016-08-06 MED ORDER — CEFAZOLIN SODIUM-DEXTROSE 2-3 GM-% IV SOLR
INTRAVENOUS | Status: AC | PRN
  Administered 2016-08-06: 2 g via INTRAVENOUS

## 2016-08-06 MED ORDER — CLOPIDOGREL BISULFATE 75 MG PO TABS
75.0000 mg | ORAL_TABLET | Freq: Once | ORAL | Status: AC
  Administered 2016-08-06: 75 mg via ORAL

## 2016-08-06 MED ORDER — HEPARIN (PORCINE) IN NACL 2-0.9 UNIT/ML-% IJ SOLN
INTRAMUSCULAR | Status: AC
  Filled 2016-08-06: qty 1000

## 2016-08-06 MED ORDER — MIDAZOLAM HCL 2 MG/2ML IJ SOLN
INTRAMUSCULAR | Status: AC
  Filled 2016-08-06: qty 2

## 2016-08-06 MED ORDER — ACETAMINOPHEN 325 MG PO TABS: 325.0000 mg | ORAL_TABLET | ORAL | Status: DC | PRN

## 2016-08-06 MED ORDER — SODIUM CHLORIDE 0.9% FLUSH: 3.0000 mL | INTRAVENOUS | Status: DC | PRN

## 2016-08-06 MED ORDER — SODIUM CHLORIDE 0.9 % WEIGHT BASED INFUSION
3.0000 mL/kg/h | INTRAVENOUS | Status: AC
  Administered 2016-08-06: 3 mL/kg/h via INTRAVENOUS

## 2016-08-06 MED ORDER — ONDANSETRON HCL 4 MG/2ML IJ SOLN: 4.0000 mg | Freq: Four times a day (QID) | INTRAMUSCULAR | Status: DC | PRN

## 2016-08-06 MED ORDER — ALUM & MAG HYDROXIDE-SIMETH 200-200-20 MG/5ML PO SUSP
15.0000 mL | ORAL | Status: DC | PRN
  Filled 2016-08-06: qty 30

## 2016-08-06 MED ORDER — LIDOCAINE HCL (PF) 1 % IJ SOLN
INTRAMUSCULAR | Status: DC | PRN
  Administered 2016-08-06: 22 mL

## 2016-08-06 MED ORDER — HYDRALAZINE HCL 20 MG/ML IJ SOLN: 5.0000 mg | INTRAMUSCULAR | Status: DC | PRN

## 2016-08-06 MED ORDER — SODIUM CHLORIDE 0.9 % IV SOLN: 500.0000 mL | Freq: Once | INTRAVENOUS | Status: DC | PRN

## 2016-08-06 SURGICAL SUPPLY — 21 items
BALLN COYOTE ES OTW 4X40X145 (BALLOONS) ×3
BALLN IN.PACT DCB 5X60 (BALLOONS) ×3
BALLOON COYOTE ES OTW 4X40X145 (BALLOONS) IMPLANT
CATH OMNI FLUSH 5F 65CM (CATHETERS) ×1 IMPLANT
COVER PRB 48X5XTLSCP FOLD TPE (BAG) IMPLANT
COVER PROBE 5X48 (BAG) ×3
DCB IN.PACT 5X60 (BALLOONS) IMPLANT
DEVICE CLOSURE PERCLS PRGLD 6F (VASCULAR PRODUCTS) IMPLANT
KIT ENCORE 26 ADVANTAGE (KITS) ×1 IMPLANT
KIT MICROINTRODUCER STIFF 5F (SHEATH) ×1 IMPLANT
KIT PV (KITS) ×3 IMPLANT
PERCLOSE PROGLIDE 6F (VASCULAR PRODUCTS) ×3
SHEATH PINNACLE 5F 10CM (SHEATH) ×1 IMPLANT
SHEATH PINNACLE MP 7F 45CM (SHEATH) ×1 IMPLANT
SYRINGE MEDRAD AVANTA MACH 7 (SYRINGE) ×1 IMPLANT
TAPE RADIOPAQUE TURBO (MISCELLANEOUS) ×1 IMPLANT
TRANSDUCER W/STOPCOCK (MISCELLANEOUS) ×3 IMPLANT
TRAY PV CATH (CUSTOM PROCEDURE TRAY) ×3 IMPLANT
TUBING CIL FLEX 10 FLL-RA (TUBING) ×1 IMPLANT
WIRE HITORQ VERSACORE ST 145CM (WIRE) ×1 IMPLANT
WIRE SPARTACORE .014X300CM (WIRE) ×1 IMPLANT

## 2016-08-06 NOTE — Research (Signed)
TRANSCEND Informed Consent   Subject Name: [name] [facility]  Subject met inclusion and exclusion criteria.  The informed consent form, study requirements and expectations were reviewed with the subject and questions and concerns were addressed prior to the signing of the consent form.  The subject verbalized understanding of the trail requirements.  The subject agreed to participate in the TRANSCEND trial and signed the informed consent.  The informed consent was obtained prior to performance of any protocol-specific procedures for the subject.  A copy of the signed informed consent was given to the subject and a copy was placed in the subject's medical record. Only applicable if randomized.   [name] 08/06/2016, 0640 AM

## 2016-08-06 NOTE — Discharge Instructions (Signed)

## 2016-08-06 NOTE — H&P (View-Only) (Signed)
OFFICE VISIT NOTES COPIED TO EPIC FOR DOCUMENTATION  . History of Present Illness Laverda Page MD; 06/26/2016 8:54 PM) Patient words: Last OV 05/06/2016; 6 week FU for pad, angina, bruit, carotid duplex, pt hasnt picked up HCTZ from pharmacy yet, Zontivy cost too much.  The patient is a 65 year old female who presents for a Follow-up for Shortness of breath.  Additional reasons for visit:  Follow-up for Peripheral vascular disease is described as the following: Patient was seen by me 2 months ago for evaluation of peripheral arterial disease and also palpitations. She continues to complain of cramping in her legs right worse than the left. She was started on Zontivity which helped her claudication especially night cramps but could not afford this.  She continues to have occasional chest discomfort, lasting for 10-15 minutes with radiation to the back and sometimes to her jaw. But her main complaint is claudication and marked dyspnea. Unfortunately contineud to smoke about 2 PPD. Has occasional palpitations.  No recent weight change, no dark stools, She has a history of hypertension, hypertriglyceridemia, prediabetes. she is not a diabetic. 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  In Nov 2017, Stress test was negative for evidence of ischemia, echo revealed grade I diastolic dysfunction and mild MR, but was otherwise normal. Right ABI was reduced at 0.76, LABI normal at 1.00 and abdominal aortic duplex revealed diffuse calcific plaque without AAA.   Problem List/Past Medical Anderson Malta Sergeant; 06/26/2016 2:17 PM) Hypertension, essential, benign (I10)  Mild hyperlipidemia (E78.5)  Pre-diabetes (R73.03)  Tobacco use disorder (F17.200)  Labwork  Labs 02/07/2016: Total was 127, triglycerides 131, HDL 43, LDL 58, LDL particle number 690. LP-IR 52. Serum glucose 140 mg, BUN 10, creatinine 0.68, potassium 4.5. CMP normal. Alkaline phosphatase minimally elevated at 139. 10/16/2015:  Glucose 158, creatinine 0.6, potassium 4.9, CMP normal, total cholesterol 165, triglycerides 173, HDL 38, LDL 94, LDL particle #1181, TSH 3.66, vitamin D 18.8, CBC normal Screening for AAA (abdominal aortic aneurysm) (Z13.6)  Abdominal aortic duplex 11/15/2015: Diffuse calcific plaque noted in the proximal, mid and distal aorta. The maximum aorta diameter is 2.48 cm (dist). Normal iliac velocity. No AAA. Screening (Z13.9)  ABI 11/22/2015: This exam reveals moderately decreased perfusion of the right lower extremity with RABI 0.76 and normal perfusion of the left lower extremity with LABI 1.00. Dyspnea on exertion (R06.09)  Echocardiogram 11/22/2015: Left ventricle cavity is normal in size. Normal global wall motion. Doppler evidence of grade I (impaired) diastolic dysfunction, normal LAP. Calculated EF 63%. Mild (Grade I) mitral regurgitation. Trace tricuspid regurgitation. Unable to estimate PA pressure due to absence/minimal TR signal. PAD (peripheral artery disease) (I73.9)  Lower extremity arterial duplex 12/14/2015: No hemodynamically significant stenoses are identified in the lower extremity arterial system. Left mid SFA <50% stenosis. This exam reveals moderately decreased perfusion of the right lower extremity, with RABI 0.67 and mildly decreased perfusion of the left lower extremity with LABI 0.81, noted at the post tibial artery level with biphasic waveforms below the knee vessels. Study suggests diffuse disease. ABI 11/22/2015: This exam reveals moderately decreased perfusion of the right lower extremity with RABI 0.76 and normal perfusion of the left lower extremity with LABI 1.00. Itching (L29.9)  Intermittent palpitations (R00.2)  Event Monitor 2 weeks 03/04/2016: Patient symptoms of palpitations and skipped beat revealed normal sinus rhythm. Episode of chest pain does reveal 2.5 mm horizontal ST segment depression. Angina pectoris (I20.9)  Lexiscan myoview stress test 11/06/2015: 1. The  resting electrocardiogram  demonstrated normal sinus rhythm, normal resting conduction, no resting arrhythmias and normal rest repolarization. Stress EKG is non-diagnostic for ischemia as it a pharmacologic stress using Lexiscan. 2. The perfusion imaging study demonstrates breast attenuation artifact in anterior wall on rest images, which improves in stress images, there is no evidence of ischemia or scar. Normal wall motion and normal LV systolic function, EF 69%. This represents a low risk study. Bilateral carotid bruits (R09.89)  Carotid artery duplex 05/16/2016: Stenosis in the right internal carotid artery (16-49%). Mild stenosis in the right external carotid artery (<50%). Stenosis in the left internal carotid artery (16-49%). Mild stenosis in the left external carotid artery (<50%). Antegrade vertebral artery flow. Follow up in one year is appropriate if clinically indicated.  Allergies Laverda Page, MD; 07-04-16 2:56 PM) Codeine/Codeine Derivatives  Itching. can take at times Sulfa Drugs  BEE VENOM  Iohexol *DIAGNOSTIC PRODUCTS*  Anaphylaxis, Difficulty breathing.  Family History Anderson Malta Sergeant; 07/04/2016 2:17 PM) Mother  Deceased. at age 12 unknown cause; mini strokes, heart disease Father  Deceased. at age 80 from dementia;heart problems & htn Sister 2  half sisters; younger; no known heart conditions Brother 3  1 older has heart arrhythmia 2 younger;  Social History Anderson Malta Sergeant; 07/04/2016 2:17 PM) Current tobacco use  Current every day smoker. 1.5 - 2 ppd, approx 55-60 pack year history Non Drinker/No Alcohol Use  Marital status  Married. Number of Children  2. Living Situation  Lives with spouse.  Past Surgical History Anderson Malta Sergeant; 07/04/2016 2:17 PM) Cholecystectomy [1976]: Cesarean Delivery [09/02/1983]: Tubal Ligation [06/15/1984]: Arthroscopic Knee Surgery - Left [07/29/1986]: Total Hip Replacement - Left [02/1997]: Hysterectomy;  Total [06/16/2000]: Discectomy [10/06/2007]: Carpal Tunnel Surgery - Both [6295]: Elbow Surgery [12/19/2015]: Right. Total Knee Replacement - Right [01/12/2016]:  Medication History Anderson Malta Sergeant; 07/04/2016 2:30 PM) Losartan Potassium (100MG  Tablet, 1 (one) Tablet Oral daily, Taken starting 06/18/2016) Active. (D/C Valsartan d/t cost) HydroCHLOROthiazide (12.5MG  Tablet, 1 (one) Tablet Oral daily, Taken starting 06/18/2016) Active. Nitrostat (0.4MG  Tab Sublingual, 1 (one) Tablet Sublingual every 5 minutes as needed for chest pain., Taken starting 05/17/2016) Active. Zontivity (2.08MG  Tablet, 1 (one) Tablet Oral daily, Taken starting 05/17/2016) Active. BuPROPion HCl ER (Smoking Det) (150MG  Tablet ER 12HR, 1 (one) Tablet Oral am and 4 pm, Taken starting 05/17/2016) Active. (Start once daily for 3 days then twice daily) DilTIAZem CD (240MG  Capsule ER 24HR, 1 (one) Capsule Oral daily, Taken starting 05/08/2016) Active. Rosuvastatin Calcium (10MG  Tablet, 1 (one) Tablet Tablet Tablet Oral daily, Taken starting 02/12/2016) Active. ROPINIRole HCl (1MG  Tablet, 1 Oral four times daily) Active. Oxycodone-Acetaminophen (7.5-325MG  Tablet, 1 Oral every four hours as needed for pain) Active. DULoxetine HCl (60MG  Capsule DR Part, 1 Oral daily) Active. Aspirin (81MG  Tablet Chewable, 1 Oral two times daily) Active. Prevacid 24HR (15MG  Capsule DR, 1 Oral evey 4 hours as needed) Active. Ventolin HFA (108 (90 Base)MCG/ACT Aerosol Soln, 1-2 puffs Inhalation as needed) Active. EpiPen 2-Pak (0.3MG /0.3ML Soln Auto-inj, Injection as directed) Active. Methocarbamol (500MG  Tablet, 1 Oral daily as needed) Active. Tums (1-2 Oral as needed) Specific strength unknown - Active. Docusate Sodium (100MG  Capsule, 2 Oral daily) Active. Medications Reconciled (medication present)  Diagnostic Studies History Anderson Malta Sergeant; 07/04/2016 8:34 AM) Carotid Doppler [05/16/2016]: Stenosis in the right  internal carotid artery (16-49%). Mild stenosis in the right external carotid artery (<50%). Stenosis in the left internal carotid artery (16-49%). Mild stenosis in the left external carotid artery (<50%). Antegrade vertebral artery flow. Follow up in one year is  appropriate if clinically indicated.    Review of Systems Laverda Page MD; 06/26/2016 8:55 PM) General Not Present- Anorexia, Fatigue and Fever. Skin Present- Itching. Respiratory Present- Difficulty Breathing on Exertion. Not Present- Cough. Cardiovascular Present- Chest Pain (improved from previous), Night Cramps and Palpitations. Not Present- Edema, Orthopnea and Paroxysmal Nocturnal Dyspnea. Gastrointestinal Not Present- Black, Tarry Stool, Change in Bowel Habits and Nausea. Musculoskeletal Present- Joint Pain. Neurological Present- Dizziness. Not Present- Focal Neurological Symptoms and Syncope. Endocrine Not Present- Cold Intolerance, Excessive Sweating, Heat Intolerance and Thyroid Problems. Hematology Not Present- Anemia, Easy Bruising, Petechiae and Prolonged Bleeding.  Vitals Anderson Malta Sergeant; 06/26/2016 2:32 PM) 06/26/2016 2:19 PM Weight: 205.06 lb Height: 61in Body Surface Area: 1.91 m Body Mass Index: 38.75 kg/m  Pulse: 90 (Regular)  P.OX: 97% (Room air) BP: 138/63 (Sitting, Left Arm, Standard)       Physical Exam Laverda Page MD; 06/26/2016 9:00 PM) General Mental Status-Alert. General Appearance-Cooperative, Appears stated age, Not in acute distress. Build & Nutrition-Short statured and Morbidly obese.  Head and Neck Neck -Note: Short neck and difficult to evaluate JVD.  Thyroid Gland Characteristics - no palpable nodules, no palpable enlargement.  Cardiovascular Cardiovascular examination reveals -normal heart sounds, regular rate and rhythm with no murmurs. Inspection Jugular vein - Right - No Distention.  Abdomen Inspection Contour - Obese and Pannus  present. Palpation/Percussion Normal exam - Non Tender and No hepatosplenomegaly. Auscultation Normal exam - Bowel sounds normal.  Peripheral Vascular Lower Extremity Inspection - Bilateral - Inspection Normal. Palpation - Edema - Bilateral - 2+ Pitting edema(ankle). Femoral pulse - Bilateral - Feeble(Pulsus difficult to feel due to patient's bodily habitus.), No Bruits. Popliteal pulse - Bilateral - Feeble(Pulsus difficult to feel due to patient's bodily habitus.). Dorsalis pedis pulse - Bilateral - Normal. Posterior tibial pulse - Bilateral - Normal. Carotid arteries - Bilateral-No Carotid bruit.  Neurologic Neurologic evaluation reveals -alert and oriented x 3 with no impairment of recent or remote memory. Motor-Grossly intact without any focal deficits.  Musculoskeletal Global Assessment Left Lower Extremity - normal range of motion without pain. Right Lower Extremity - normal range of motion without pain.    Assessment & Plan Laverda Page MD; 06/26/2016 9:00 PM) Intermittent palpitations (R00.2) Story: Event Monitor 2 weeks 03/04/2016: Patient symptoms of palpitations and skipped beat revealed normal sinus rhythm. Episode of chest pain does reveal 2.5 mm horizontal ST segment depression. Impression: EKG 03/04/2016: Sinus tachycardia to 104 bpm, biatrial enlargement, low voltage complexes. Nonspecific T abnormality. Angina pectoris (I20.9) Story: Lexiscan myoview stress test 11/06/2015: 1. The resting electrocardiogram demonstrated normal sinus rhythm, normal resting conduction, no resting arrhythmias and normal rest repolarization. Stress EKG is non-diagnostic for ischemia as it a pharmacologic stress using Lexiscan. 2. The perfusion imaging study demonstrates breast attenuation artifact in anterior wall on rest images, which improves in stress images, there is no evidence of ischemia or scar. Normal wall motion and normal LV systolic function, EF 60%. This represents  a low risk study. Impression: Event Monitor 2 weeks 03/04/2016: Patient symptoms of palpitations and skipped beat revealed normal sinus rhythm. Episode of chest pain does reveal 2.5 mm horizontal ST segment depression. Future Plans 7/37/1062: METABOLIC PANEL, BASIC (69485) - one time 07/08/2016: CBC & PLATELETS (AUTO) (46270) - one time 07/08/2016: PT (PROTHROMBIN TIME) (35009) - one time Dyspnea on exertion (R06.09) Story: Echocardiogram 11/22/2015: Left ventricle cavity is normal in size. Normal global wall motion. Doppler evidence of grade I (impaired) diastolic dysfunction, normal LAP. Calculated EF  63%. Mild (Grade I) mitral regurgitation. Trace tricuspid regurgitation. Unable to estimate PA pressure due to absence/minimal TR signal. Hypertension, essential, benign (I10) Current Plans Continued HydroCHLOROthiazide 12.5MG , 1 (one) Tablet daily, #90, 06/26/2016, Ref. x3. PAD (peripheral artery disease) (I73.9) Story: Lower extremity arterial duplex 12/14/2015: No hemodynamically significant stenoses are identified in the lower extremity arterial system. Left mid SFA <50% stenosis. This exam reveals moderately decreased perfusion of the right lower extremity, with RABI 0.67 and mildly decreased perfusion of the left lower extremity with LABI 0.81, noted at the post tibial artery level with biphasic waveforms below the knee vessels. Study suggests diffuse disease.  ABI 11/22/2015: This exam reveals moderately decreased perfusion of the right lower extremity with RABI 0.76 and normal perfusion of the left lower extremity with LABI 1.00. Current Plans Started Clopidogrel Bisulfate 75MG , 1 (one) Tablet daily, #30, 06/26/2016, Ref. x3. Labwork  07/11/2016: Creatinine 0.65, potassium 3.7, BMP normal.  Hematocrit 33.6, MCV 78, MCH 25.6, RDW 17.2%.  CBC otherwise normal.  INR 1.0, prothrombin time 9.9. Labs 02/07/2016: Total was 127, triglycerides 131, HDL 43, LDL 58, LDL particle number 690. LP-IR 52.  Serum glucose 140 mg, BUN 10, creatinine 0.68, potassium 4.5. CMP normal. Alkaline phosphatase minimally elevated at 139.  10/16/2015: Glucose 158, creatinine 0.6, potassium 4.9, CMP normal, total cholesterol 165, triglycerides 173, HDL 38, LDL 94, LDL particle #1181, TSH 3.66, vitamin D 18.8, CBC normal Bilateral carotid bruits (R09.89) Story: Carotid artery duplex 05/16/2016: Stenosis in the right internal carotid artery (16-49%). Mild stenosis in the right external carotid artery (<50%). Stenosis in the left internal carotid artery (16-49%). Mild stenosis in the left external carotid artery (<50%). Antegrade vertebral artery flow. Follow up in one year is appropriate if clinically indicated. Tobacco use disorder (F17.200) Current Plans Mechanism of underlying disease process and action of medications discussed with the patient. I discussed primary/secondary prevention and also dietary counseling was done. She still continues to have chest pain with radiation to her jaw occasionally with exertion activity but overall has improved. Event monitor during evaluation of palpitation also revealed significant ST depression during palpitations. I have advised her strongly discontinued tobacco use. Since being on medical therapy she has done well, symptoms of angina has improved. I referred her hydrochlorothiazide. Due to symptoms of claudication that are getting worse, I have added Plavix 75 mg daily, patient could not afford Zontivity which did improve her symptoms. Carotid duplex discussed, mild disease is evident. Due to continued symptoms of claudication, I have recommended proceeding with peripheral angiography.  Needs to schedule for peripheral arteriogram and possible angioplasty given symptoms. Patient understands the risks, benefits, alternatives including medical therapy, CT angiography. Patient understands <1-2% risk of death, embolic complications, bleeding, infection, renal failure, urgent  surgical revascularization, but not limited to these and wants to proceed. Office visit after the tests.  CC: Alvester Chou, NP    Signed by Laverda Page, MD (06/26/2016 9:01 PM)

## 2016-08-06 NOTE — Interval H&P Note (Signed)
History and Physical Interval Note:  08/06/2016 7:43 AM  [name]  has presented today for surgery, with the diagnosis of pvd  The various methods of treatment have been discussed with the patient and family. After consideration of risks, benefits and other options for treatment, the patient has consented to  Procedure(s): Lower Extremity Intervention (N/A) as a surgical intervention .  The patient's history has been reviewed, patient examined, no change in status, stable for surgery.  I have reviewed the patient's chart and labs.  Questions were answered to the patient's satisfaction.     Adrian Prows

## 2016-08-13 ENCOUNTER — Telehealth: Payer: Self-pay | Admitting: Neurology

## 2016-08-13 ENCOUNTER — Telehealth: Payer: Self-pay

## 2016-08-13 DIAGNOSIS — M1990 Unspecified osteoarthritis, unspecified site: Secondary | ICD-10-CM

## 2016-08-13 DIAGNOSIS — R768 Other specified abnormal immunological findings in serum: Secondary | ICD-10-CM

## 2016-08-13 NOTE — Telephone Encounter (Signed)
Order changed in [place]

## 2016-08-13 NOTE — Telephone Encounter (Signed)
[facility] called needing to have her order changed from With / Without to only With out. Thanks

## 2016-08-13 NOTE — Telephone Encounter (Signed)
Spoke with pt.  She states that she is being seen by Dr. Amedeo Plenty with [facility].  I will send results and LOV notes over.  I have put referral in the system for Rheum.

## 2016-08-13 NOTE — Telephone Encounter (Signed)
-----   Message from Cameron Sprang, MD sent at 08/06/2016  3:57 PM EDT ----- Pls let her know one of the markers for inflammation was elevated, we had discussed seeing a Rheumatologist, would recommend she proceed, as this can cause arthritis. Pls send referral to Rheum for positive ANA and arthritis if she does not have one already. Thanks

## 2016-08-16 ENCOUNTER — Telehealth: Payer: Self-pay

## 2016-08-16 DIAGNOSIS — R0609 Other forms of dyspnea: Secondary | ICD-10-CM | POA: Diagnosis not present

## 2016-08-16 DIAGNOSIS — Z006 Encounter for examination for normal comparison and control in clinical research program: Secondary | ICD-10-CM | POA: Diagnosis not present

## 2016-08-16 DIAGNOSIS — I739 Peripheral vascular disease, unspecified: Secondary | ICD-10-CM | POA: Diagnosis not present

## 2016-08-16 DIAGNOSIS — I10 Essential (primary) hypertension: Secondary | ICD-10-CM | POA: Diagnosis not present

## 2016-08-16 NOTE — Telephone Encounter (Signed)
-----   Message from Irene Shipper, MD sent at 08/16/2016  2:58 PM EDT ----- Regarding: RE: Guaic positive stool Vaughan Basta, I have not seen this patient in years. She needs an office appointment per Dr. Einar Gip. Please have her see an extender. Convert this to phone note for the record and future reference. Thanks Dr. Henrene Pastor ----- Message ----- From: Adrian Prows, MD Sent: 08/16/2016   1:57 PM To: Irene Shipper, MD Subject: Guaic positive stool                           Recently dropping Hb on ASA and plavix. She can stop Plavix if she needs procedure. Can you please call and set her up to see you.    Adrian Prows, MD 08/16/2016, 1:58 PM [facility] [place] Pager: [phone] Office: [phone] Cell:  [phone]

## 2016-08-16 NOTE — Telephone Encounter (Signed)
Pt scheduled to see [name] PA 08/22/16@1 :45pm. Left message for pt to call back.

## 2016-08-19 NOTE — Telephone Encounter (Signed)
Spoke with pt and she is aware of appt. 

## 2016-08-19 NOTE — Telephone Encounter (Signed)
Unable to reach pt

## 2016-08-22 ENCOUNTER — Other Ambulatory Visit (INDEPENDENT_AMBULATORY_CARE_PROVIDER_SITE_OTHER): Payer: Medicare Other

## 2016-08-22 ENCOUNTER — Encounter: Payer: Self-pay | Admitting: Physician Assistant

## 2016-08-22 ENCOUNTER — Ambulatory Visit (INDEPENDENT_AMBULATORY_CARE_PROVIDER_SITE_OTHER): Payer: Medicare Other | Admitting: Physician Assistant

## 2016-08-22 VITALS — BP 140/72 | HR 96 | Ht 60.43 in | Wt 200.5 lb

## 2016-08-22 DIAGNOSIS — K219 Gastro-esophageal reflux disease without esophagitis: Secondary | ICD-10-CM | POA: Diagnosis not present

## 2016-08-22 DIAGNOSIS — R110 Nausea: Secondary | ICD-10-CM

## 2016-08-22 DIAGNOSIS — K5900 Constipation, unspecified: Secondary | ICD-10-CM

## 2016-08-22 DIAGNOSIS — Z86010 Personal history of colonic polyps: Secondary | ICD-10-CM | POA: Diagnosis not present

## 2016-08-22 DIAGNOSIS — Z7901 Long term (current) use of anticoagulants: Secondary | ICD-10-CM

## 2016-08-22 DIAGNOSIS — D649 Anemia, unspecified: Secondary | ICD-10-CM | POA: Diagnosis not present

## 2016-08-22 DIAGNOSIS — R1310 Dysphagia, unspecified: Secondary | ICD-10-CM | POA: Diagnosis not present

## 2016-08-22 LAB — IBC PANEL
Iron: 30 ug/dL — ABNORMAL LOW (ref 42–145)
SATURATION RATIOS: 5.5 % — AB (ref 20.0–50.0)
TRANSFERRIN: 390 mg/dL — AB (ref 212.0–360.0)

## 2016-08-22 LAB — CBC WITH DIFFERENTIAL/PLATELET
Basophils Absolute: 0 10*3/uL (ref 0.0–0.1)
Basophils Relative: 0.7 % (ref 0.0–3.0)
Eosinophils Absolute: 0.3 10*3/uL (ref 0.0–0.7)
Eosinophils Relative: 5.7 % — ABNORMAL HIGH (ref 0.0–5.0)
HEMATOCRIT: 30.9 % — AB (ref 36.0–46.0)
Hemoglobin: 9.6 g/dL — ABNORMAL LOW (ref 12.0–15.0)
LYMPHS ABS: 1.8 10*3/uL (ref 0.7–4.0)
LYMPHS PCT: 31.7 % (ref 12.0–46.0)
MCHC: 31.2 g/dL (ref 30.0–36.0)
MCV: 77.6 fl — AB (ref 78.0–100.0)
MONOS PCT: 8.9 % (ref 3.0–12.0)
Monocytes Absolute: 0.5 10*3/uL (ref 0.1–1.0)
NEUTROS PCT: 53 % (ref 43.0–77.0)
Neutro Abs: 2.9 10*3/uL (ref 1.4–7.7)
Platelets: 245 10*3/uL (ref 150.0–400.0)
RBC: 3.98 Mil/uL (ref 3.87–5.11)
RDW: 17.9 % — ABNORMAL HIGH (ref 11.5–15.5)
WBC: 5.5 10*3/uL (ref 4.0–10.5)

## 2016-08-22 LAB — FERRITIN: FERRITIN: 39.8 ng/mL (ref 10.0–291.0)

## 2016-08-22 MED ORDER — NA SULFATE-K SULFATE-MG SULF 17.5-3.13-1.6 GM/177ML PO SOLN: 1.0000 | ORAL | 0 refills | Status: DC

## 2016-08-22 MED ORDER — OMEPRAZOLE 40 MG PO CPDR: 40.0000 mg | DELAYED_RELEASE_CAPSULE | Freq: Two times a day (BID) | ORAL | 2 refills | Status: DC

## 2016-08-22 NOTE — Progress Notes (Signed)
Chief Complaint: Anemia  HPI:  [name] is a 66 year old Caucasian female with a past medical history as listed below including chronic anticoagulation with Plavix and aspirin, who presents to clinic today as a new patient with a chief complaint of guaiac positive stools and anemia.   Per review of chart patient is followed by Dr. Henrene Pastor. Her last colonoscopy was performed by Dr. Henrene Pastor on 11/30/01 with a finding of diverticulosis and colon polyps as well as internal hemorrhoids. It was recommended she have a repeat in 10 years.   Patient's most recent CBC was performed 01/15/16 with a hemoglobin of 10.9, decreased from 11.2 a year prior. MCV was normal.    Today, the patient tells me that she "isn't really sure I'm here". She explains that in January she did see some bright red blood mixed in with her bowel movements but this was after having surgery on her leg in December and having to start a lot of pain medications. She was also constipated around this time. She has not seen any blood since January. She did have labs at that time and was told that things were "lower than they wanted". She was unsure what there were talking about and has not had repeat labs since then.   Patient does describe multiple GI symptoms though today including dysphagia and nausea after eating almost anything. The patient tells me that she will eat and get nauseous, she will then drink an Alka-Seltzer and develops hiccups. She is on Prevacid 15 mg daily. She describes a history of being on multiple PPIs in the past which are no longer covered by her insurance per her report, though these have not been tried in a while. She does have almost daily heartburn and reflux. Only solid foods seem to get stuck in her throat on the way down and if she takes a few sips of water they eventually pass down. Been worse over the past few months.   Patient also describes continued constipation though this is much better on daily stool  softeners. She is continued on chronic pain medications.   Patient denies fever, chills, seeing any further bright red blood in her stool, melena, weight loss, anorexia, vomiting or symptoms that awaken her at night.  Past Medical History:  Diagnosis Date  . Arthritis   . Cancer (HCC)    cervical  . COPD (chronic obstructive pulmonary disease) (HCC)    wheezing  . GERD (gastroesophageal reflux disease)   . Hypertension   . Liver disease   . Pre-diabetes   . Seizures ([place])    no meds for 8 months  . Sleep apnea    does not use her cpap  . Stroke [facility]) 2015   no weakness  . 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 BACK SURGERY    . CESAREAN SECTION    . CHOLECYSTECTOMY    . HAND SURGERY Bilateral    carpal tunnel  . HEMORRHOID SURGERY    . KNEE SURGERY Left    arthroscopy x3  . LOWER EXTREMITY ANGIOGRAPHY N/A 07/16/2016   Procedure: Lower Extremity Angiography;  Surgeon: Adrian Prows, MD;  Location: [facility];  Service: Cardiovascular;  Laterality: N/A;  . LOWER EXTREMITY INTERVENTION N/A 08/06/2016   Procedure: Lower Extremity Intervention;  Surgeon: Adrian Prows, MD;  Location: [facility];  Service: Cardiovascular;  Laterality: N/A;  . NECK SURGERY    .  OTHER SURGICAL HISTORY  2013   bladder stimulator in back  . PERIPHERAL VASCULAR BALLOON ANGIOPLASTY  08/06/2016   Procedure: Peripheral Vascular Balloon Angioplasty;  Surgeon: Adrian Prows, MD;  Location: [facility];  Service: Cardiovascular;;  Right SFA  . TENNIS ELBOW RELEASE/NIRSCHEL PROCEDURE Right 12/2015  . TOE SURGERY Right    right foot second and fourth toe  . TOTAL HIP ARTHROPLASTY Left   . TOTAL KNEE ARTHROPLASTY Right 01/12/2016   Procedure: RIGHT TOTAL KNEE ARTHROPLASTY;  Surgeon: Netta Cedars, MD;  Location: [facility];  Service: Orthopedics;  Laterality: Right;  . TUBAL LIGATION    . VAGINAL HYSTERECTOMY      Current Outpatient  Prescriptions  Medication Sig Dispense Refill  . albuterol (PROVENTIL HFA;VENTOLIN HFA) 108 (90 BASE) MCG/ACT inhaler Inhale 2 puffs into the lungs every 4 (four) hours as needed for wheezing or shortness of breath (cough). 1 Inhaler 0  . aspirin 81 MG chewable tablet Chew 81 mg by mouth daily.    Marland Kitchen aspirin-sod bicarb-citric acid (ALKA-SELTZER) 325 MG TBEF tablet Take 650 mg by mouth 3 (three) times daily after meals.    Marland Kitchen buPROPion (WELLBUTRIN XL) 150 MG 24 hr tablet Take 150-300 mg by mouth See admin instructions. TAKE 1 TABLET BY MOUTH IN THE MORNING & AT 4PM. START WITH 1 DAILY FOR 3 DAYS THEN INCREASE TO 2.  2  . calcium carbonate (TUMS EX) 750 MG chewable tablet Chew 2-4 tablets by mouth as needed for heartburn.    . clopidogrel (PLAVIX) 75 MG tablet Take 75 mg by mouth daily after breakfast.  3  . diltiazem (CARDIZEM CD) 240 MG 24 hr capsule Take 240 mg by mouth daily after breakfast.  1  . docusate sodium (COLACE) 100 MG capsule Take 200 mg by mouth daily after breakfast.    . DULoxetine (CYMBALTA) 60 MG capsule Take 60 mg by mouth daily after breakfast.     . hydrochlorothiazide (HYDRODIURIL) 12.5 MG tablet Take 12.5 mg by mouth daily after breakfast.  0  . lansoprazole (PREVACID 24HR) 15 MG capsule Take 15 mg by mouth daily as needed (for acid reflux).    Marland Kitchen losartan (COZAAR) 100 MG tablet Take 100 mg by mouth daily after breakfast.  0  . methocarbamol (ROBAXIN) 500 MG tablet Take 1 tablet (500 mg total) by mouth 3 (three) times daily as needed. (Patient taking differently: Take 500 mg by mouth every 6 (six) hours as needed for muscle spasms. ) 60 tablet 1  . nitroGLYCERIN (NITROSTAT) 0.4 MG SL tablet Place 0.4 mg under the tongue every 5 (five) minutes x 3 doses as needed. For chest pain.  1  . OVER THE COUNTER MEDICATION Apply 1 application topically as needed (joint pain). Magnilife topicall muscle rub    . OVER THE COUNTER MEDICATION Apply 1 application topically daily as needed (leg  pain). Horse ligament otc muscle rub    . oxyCODONE-acetaminophen (PERCOCET) 7.5-325 MG tablet Take 1-2 tablets by mouth every 4 (four) hours as needed for severe pain. (Patient taking differently: Take 1-2 tablets by mouth every 4 (four) hours. ) 60 tablet 0  . rOPINIRole (REQUIP) 1 MG tablet Take 1 mg by mouth 4 (four) times daily.     . rosuvastatin (CRESTOR) 10 MG tablet Take 10 mg by mouth daily.  3  . Tolnaftate (ABSORBINE JR EX) Apply 1 application topically as needed (knee pain).    Marland Kitchen trolamine salicylate (ASPERCREME) 10 % cream Apply 1 application topically as  needed (hand and neck pain).     No current facility-administered medications for this visit.     Allergies as of 08/22/2016 - Review Complete 08/06/2016  Allergen Reaction Noted  . Bee venom Anaphylaxis 12/28/2015  . Codeine Hives   . Adhesive [tape]  08/02/2016  . Morphine and related Other (See Comments) 07/12/2016  . Sulfonamide derivatives      Family History  Problem Relation Age of Onset  . Hyperlipidemia Mother   . Hypertension Mother   . Hyperlipidemia Father   . Hypertension Father   . Alzheimer's disease Father   . Stroke Brother   . Cancer Daughter        unknown    Social History   Social History  . Marital status: Married    Spouse name: N/A  . Number of children: N/A  . Years of education: N/A   Occupational History  . Not on file.   Social History Main Topics  . Smoking status: Current Every Day Smoker    Packs/day: 1.50    Years: 53.00  . Smokeless tobacco: Never Used     Comment: DOWN TO 1.5PACK DAY  . Alcohol use No  . Drug use: No  . Sexual activity: No   Other Topics Concern  . Not on file   Social History Narrative  . No narrative on file    Review of Systems:    Constitutional: No weight loss, fever or chills Skin: No rash  Cardiovascular: No chest pain Respiratory: No SOB  Gastrointestinal: See HPI and otherwise negative Genitourinary: No dysuria  Neurological:  No headache Musculoskeletal: No new muscle or joint pain Hematologic: No bleeding or bruising Psychiatric: No history of depression or anxiety   Physical Exam:  Vital signs: BP 140/72 (BP Location: Left Arm, Patient Position: Sitting, Cuff Size: Normal)   Pulse 96   Ht 5' 0.43" (1.535 m) Comment: height measured without shoes  Wt 200 lb 8 oz (90.9 kg)   BMI 38.60 kg/m    Constitutional:   Pleasant overweight Caucasian female appears to be in NAD, Well developed, Well nourished, alert and cooperative Head:  Normocephalic and atraumatic. Eyes:   PEERL, EOMI. No icterus. Conjunctiva pink. Ears:  Normal auditory acuity. Neck:  Supple Throat: Oral cavity and pharynx without inflammation, swelling or lesion.  Respiratory: Respirations even and unlabored. Lungs clear to auscultation bilaterally.   No wheezes, crackles, or rhonchi.  Cardiovascular: Normal S1, S2. No MRG. Regular rate and rhythm. No peripheral edema, cyanosis or pallor.  Gastrointestinal:  Soft, nondistended, nontender. No rebound or guarding. Normal bowel sounds. No appreciable masses or hepatomegaly. Rectal:  Not performed.  Msk:  Symmetrical without gross deformities. Without edema, no deformity or joint abnormality.  Neurologic:  Alert and  oriented x4;  grossly normal neurologically.  Skin:   Dry and intact without significant lesions or rashes. Psychiatric:  Demonstrates good judgement and reason without abnormal affect or behaviors.  MOST RECENT LABS AND IMAGING: CBC    Component Value Date/Time   WBC 6.3 01/15/2016 0600   RBC 3.95 01/15/2016 0600   HGB 10.9 (L) 01/15/2016 0600   HCT 33.5 (L) 01/15/2016 0600   PLT 189 01/15/2016 0600   MCV 84.8 01/15/2016 0600   MCH 27.6 01/15/2016 0600   MCHC 32.5 01/15/2016 0600   RDW 15.3 01/15/2016 0600   LYMPHSABS 2.3 11/10/2014 1212   MONOABS 0.5 11/10/2014 1212   EOSABS 0.3 11/10/2014 1212   BASOSABS 0.0 11/10/2014 1212  CMP     Component Value Date/Time   NA  136 08/06/2016 0612   K 3.4 (L) 08/06/2016 0612   K 3.5 03/19/2011 0722   CL 101 08/06/2016 0612   CO2 29 08/06/2016 0612   GLUCOSE 104 (H) 08/06/2016 0612   BUN 12 08/06/2016 0612   CREATININE 0.80 08/06/2016 0612   CALCIUM 8.7 (L) 08/06/2016 0612   PROT 6.2 (L) 08/06/2016 0612   ALBUMIN 3.3 (L) 08/06/2016 0612   AST 17 08/06/2016 0612   ALT 22 08/06/2016 0612   ALKPHOS 112 08/06/2016 0612   BILITOT 0.3 08/06/2016 0612   GFRNONAA >60 08/06/2016 0612   GFRAA >60 08/06/2016 0612    Assessment: 1. Anemia:Labs from January showing hemoglobin of 10.9 with normal MCV, no iron studies per chart review, patient had just had orthopedic surgery at that time, we will recheck labs today 2. Chronic Anticoagulation: Plavix and ASA per chart, patient is unable to tell me why she takes these but possibly for PVD and history of stroke, she is also unsure if she is actually currently taking these medications 3. Dysphagia: to solids worse over the past few months; consider esophageal stricture versus ring versus web versus mass 4. Constipation: Likely due to chronic pain medication, better with stool softeners 5. GERD: Associated with nausea, uncontrolled on Prevacid 15 mg daily  6. History of Polyps: last in 2003, repeat overdue  Plan: 1. Scheduled patient for EGD and Colonoscopy in the [place] with Dr. Henrene Pastor, Did discuss risks, benefits, limitation and alternatives and the patient agrees to proceed. Patient requested that these be scheduled a few months from now. 2. Ordered repeat CBC and iron studies including ferritin 3. Would recommend patient continue her daily stool softeners 4. Patient will need to hold her Plavix for 5 days prior to her procedure we will communicate this with her cardiologist to insure that this is acceptable for her. 5. Prescribed Omeprazole 40 mg twice daily, 30-60 minutes before eating 6. Patient to follow in clinic per recommendations from Dr. Henrene Pastor after time of  procedures.  [name], PA-C [facility] 08/22/2016, 1:41 PM

## 2016-08-22 NOTE — Patient Instructions (Signed)
Your physician has requested that you go to the basement for lab work before leaving today.  We have sent the following medications to your pharmacy for you to pick up at your convenience: Omeprazole 40 mg twice a day  You have been scheduled for an endoscopy and colonoscopy. Please follow the written instructions given to you at your visit today. Please pick up your prep supplies at the pharmacy within the next 1-3 days. If you use inhalers (even only as needed), please bring them with you on the day of your procedure. Your physician has requested that you go to www.startemmi.com and enter the access code given to you at your visit today. This web site gives a general overview about your procedure. However, you should still follow specific instructions given to you by our office regarding your preparation for the procedure.

## 2016-08-23 DIAGNOSIS — Z79891 Long term (current) use of opiate analgesic: Secondary | ICD-10-CM | POA: Diagnosis not present

## 2016-08-23 DIAGNOSIS — M47812 Spondylosis without myelopathy or radiculopathy, cervical region: Secondary | ICD-10-CM | POA: Diagnosis not present

## 2016-08-23 DIAGNOSIS — G894 Chronic pain syndrome: Secondary | ICD-10-CM | POA: Diagnosis not present

## 2016-08-23 DIAGNOSIS — M150 Primary generalized (osteo)arthritis: Secondary | ICD-10-CM | POA: Diagnosis not present

## 2016-08-26 NOTE — Progress Notes (Signed)
Assessment and plans in this high risk patient reviewed

## 2016-08-28 ENCOUNTER — Other Ambulatory Visit: Payer: Self-pay | Admitting: *Deleted

## 2016-08-28 DIAGNOSIS — Z006 Encounter for examination for normal comparison and control in clinical research program: Secondary | ICD-10-CM

## 2016-08-28 NOTE — Research (Signed)
Inclusions/Exclusions ~Clinical~ Inclusions: Y   N ? ? Subject ? 17 years ? ? Subject has target limb Rutherford classification 2, 3, 4 ? ? Subject has provided written informed consent and is willing to comply with study follow up requirements.  Exclusions: ?  ? Subject has acute limb ischemia. ?  ? Subject underwent intervention involving the target vessel within the previous 90 days ? ? Subject underwent any lower extremity percutaneous treatment in the ipsilateral limb using a paclitaxel-eluting stent or a DCB within the previous 90 days.  ? ? Subject underwent PTA of the target lesion using a DCB within the previous 180 days.  ? ? Subject has had prior vascular intervention in the contralateral limb within 14 days before the planned study index procedure or subject has planned vascular intervention in the contralateral limb within 30 days after the index procedure. ? ? Women who are pregnant, breast-feeding or intend to become pregnant or men who intend to father children during the time of the study.  ? ? Subject has life expectancy less than 2 years.  ? ? Subject is allergic to ALL antiplatelet treatments.  ? ? Subject has impaired renal function (i.e. serum creatinine level ?2.5 mg/dl). ? ? 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 ? ? Subject is receiving immunosuppressant therapy. ? ? Subject has known or suspected active infection at the time of the index procedure. ? ? Subject has platelet count <100,000/mm or >700,000/mm ? ? Subject has history of gastrointestinal hemorrhage requiring a transfusion within 3 months prior to the study procedure.  ? ? Subject diagnosed with coagulopathy that precludes the treatment with systemic anticoagulation and/or dual antiplatelet therapy (DAPT). ? ? Subject has history of stroke within the past 90 days.  ? ? Subject has a history of myocardial infarction within the past 30 days.  ? ? Subject is unable to tolerate blood transfusions because of religious  beliefs or other reasons.  ? ? Subject is incarcerated, mentally incompetent, or abusing drugs or alcohol.  ? ? Subject is participating in another investigational drug or medical device study that has not completed primary endpoint(s) evaluation or that clinically interferes with the endpoints from this study, or subject is planning to participate in such studies prior to the completion of this study.  ? ? Subject has previous bypass surgery of the target lesion. ? ? Subject had previous treatment of the target vessel with thrombolysis or surgery.  ? ? Subject is unwilling or unable to comply with procedures specified in the protocol or has difficulty or inability to return for follow up visits by the protocol.  ~Angiographic ~ The target lesion/vessel must meet all of the following angiographic criteria.  Inclusions: Y   N ? ? De novo lesion(s) or non-stented restenotic lesion(s) occurring >90 days after prior plain old balloon angioplasty or >180 days after prior [facility] treatment. ? ? Target lesion location starts ?10 mm below the common femoral bifurcation and terminates distally at or above the end of the P1 segment of the popliteal artery.  ? ? Target vessel diameter ?4 mm and ?7 mm. ? ? Target lesion must have angiographic evidence of ?70% stenosis by operator visual estimate.  ? ? Chronic total occlusions may be included only after successful, uncomplicated wire crossing of target lesion via an anterograde approach. Successful crossing of the target lesion occurs when the tip of the guide wire is distal to the target lesion without the occurrence of flow-limiting dissection or perforation and is judged by  visual inspection to be within the true lumen. Subintimal dissection techniques may be used if re-entry occurs above the knee and without the use of re-entry devices.  ? ? Target lesion must be ?180 mm in length (one long lesion or multiple serial lesions) by operator visual estimate.  NOTE:  combination lesions must have a total lesion length of ?180 mm by visual estimate and be separated by ?30 mm. ? ? Target lesion is located at least 30 mm from any stent, if target vessel was previously stented.  ? ? Successful, uncomplicated (without use of crossing device) wire crossing of target lesion. Successful crossing of the target lesion occurs when the tip of the guide wire is distal to the target lesion without the occurrence of flow-limiting dissection or perforation and is judged by visual inspection to be within the true lumen. ? ? After pre-dilatation, the target lesion is ?70% residual stenosis absence of a flow limiting dissection and treatable with available device matrix. ? ? A patient inflow artery free from significant stenosis (?50% stenosis) as confirmed by angiography.  ? ? At least one patent native outflow artery to the ankle or foot, free from significant stenosis (?50 % stenosis) as confirmed by angiography. ~Angiographic~ Exclusions: Y   N ? ? Target lesion has severe calcification (as defined by the [facility] classification   of calcification).  ? ? Target lesion involves an aneurysm or is adjacent to an aneurysm (within 5 mm). ? ? Target lesion requires treatment with alternative therapy such as stenting, laser, atherectomy, cryoplasty, brachytherapy, or re-entry devices.  ? ? Significant target vessel tortuosity or other parameters prohibiting access to the target lesion.  ? ? Presence of thrombus in the target vessel.  ? ? Iliac inflow disease requiring treatment, unless the iliac artery disease is successfully treated first during the index procedure. Success is defined as ?30% residual diameter stenosis without death or major complications.  ? ? Presence of an aortic, iliac or femoral artificial graft.  Coordinator: Philemon Kingdom :) 08/06/16   Study Index Procedure  ? Place rule on index leg zero at groin crease  ? devices kept in research locked cabinet  ?  Keep all devices for balloons, sheaths, and non-study devices  ? Keep all investigational balloon packages  Date of Index Procedure: 08/06/2016  Time Start: 8295 AM   End Time: 6213 AM    Procedure - Lesion Details  Lesion location ?Rt. pSFA ?Rt. mSFA ?Rt. dSFA ?Rt. oSFA ?Rt. pPop ?Rt. dPop   ?Lt. pSFA ?Lt. mSFA ?Lt. dSFA ?Lt. oSFA ?Lt. pPop ?Lt. 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 Groin ?Lt. Groin ?Other________________  Post-stenosis ___0___________%    [facility] Calcification Class: ? None   ? Focal  ? Mild  ? Moderate  ? Severe  ? Unknown    Procedural Information  Start [phone] End [phone]   Target Limb ?Right Leg ?Left Leg  Sheath Access ?Contralateral ?Ipsilateral  Manufacturer ?Abbott ?AngioDynamics ?Argon ?Arrow ?Bard ?Paulita Cradle   ?Biotronik ?BSC ?COOK ?Cordis ?Deltec ?GORE   ?iVascular ?Kimberly-Clark ?Medtronic ?Merit ?OSCOR ?St. Jude   ?Telefex ?Terumo ?Other__ ?Unk  Model __PINNACLE__________________ [id number]   Iliac artery treatment for restricted inflow ?Yes ?No   Treatment ?30% residual stenosis ?Yes ?No   Treatment device used ________________________________________  Pre Dilation:   Pre-Dilation performed on Target Lesion ?Yes ?  No       Complete PD   How many inflations:____1_____ Time of balloon inflation  0817 am   Manufacturer   ?Abbott ?AngioDynamics ?Argon ?Arrow ?Bard ?Paulita Cradle   ?Biotronik ?BSC ?COOK ?Cordis ?Deltec ?GORE   ?iVascular ?Kimberly-Clark ?Medtronic ?Merit ?OSCOR ?St. Jude   ?Telefex ?Terumo ?Other__ ?Unk   Model Coyote ES   Diameter ________4_______mm Length _____40__________mm   Inflations ______14_________atm Duration ______150_________Sec   Residual stenosis ________50____%   NOTE: For more than 1 pre-dil, pull the extra pre-dil worksheet  # study devices used  ____0______________________  Non-study devices used to treat TL # of devices used ____1__________   List of devices ImPact Admiral Balloon  Post-Dil done ?Yes ?No   Bailout proc ?Yes ?No  Proc events ?Yes ?No      Procedure - Lesion Details  Lesion location ?Rt. pSFA ?Rt. mSFA ?Rt. dSFA ?Rt. oSFA ?Rt. pPop ?Rt. dPop   ?Lt. pSFA ?Lt. mSFA ?Lt. dSFA ?Lt. oSFA ?Lt. pPop ?Lt. dPop   ?Other __________________________________________________  Lesion length ______________mm Treatment areaClick or tap here to enter text. _____Click or tap here to enter text.mm  Lesion type ?De novo ?Restenotic   Pre-stenosis ______________% RVDClick or tap here to enter text. ________________mm  Access ?Rt. Groin ?Lt. Groin ?Other________________  Post-stenosis ______________%      Procedure - Randomization [facility] website - [email or web address] All Subjects Blinded  Randomized ?Yes ?No  Date 08/06/2016  Assignment ?SurVeil DCB ?IN.PACT [facility]  Correct treatment received per randomization ?Yes ?No, Specify________________________  Treatment received ?SurVeil DCB ?IN.PACT Admiral DCB   ?No treatment received ?Other: Click or tap here to enter text.  Subject blinded ?Yes ?No  Procedure - Device Details #1  Balloon Lot# ___[id number]______________ Expiration date __2021_/_01__/_31__  Balloon Diameter ___5.0___________mm Balloon length _______60_______mm  TL treated w/this device       ?Yes          ?No  # of inflations _________1__________ Time of inflation 08:38Click or tap here to enter text.  Pressure _____14_________atm Duration ______180________sec  Major dissection after all inflations ?Yes          ?No   Dissection grade ?A ?B ?C ?D ?E ?F ?Unk  Final stenosis ___________0_______% (After all inflations)  Lesion Covered:   ? Yes       ? NO  Device used Successfully:   ? Yes       ? NO  Study Device Deficiency:    ? Yes       ? No   Device returned to sponsor: ? Yes       ? No*   *Why device  not returned: Click or tap here to enter text.      Procedure - Device Details #2       ? N/A  Balloon Lot# _________________ Expiration date ___/___/___  Balloon Diameter ______________mm Balloon length ______________mm  TL treated w/this device       ?Yes          ?No  # of inflations ___________________ Time of inflation ____:____  Pressure ______________atm Duration ______________sec  Major dissection after all inflations ?Yes          ?No   Dissection grade ?A ?B ?C ?D ?E ?F ?Unk  Final stenosis __________________% (After all inflations)  Lesion Covered:   ? Yes       ? NO  Device used Successfully:   ? Yes       ? NO  Study Device Deficiency:    ?  Yes       ? No   Device returned to sponsor: ? Yes       ? No*   *Why device not returned: Click or tap here to enter text.  Procedure - Device Deficiency   ?N/A  (For a device deficiency, keep balloon & packaging for return) Location at time of deficiency ?Out of the body ?In introducer sheath?In TV but not TL  ?In TL ?Other_______________________________________ When did the deficiency occur ?During device prep ?Introduction of device ?During advancement  ?Pre-Inflation ?During Inflation ?Deflation  ?Removal of device ?Other______________________________________ Device deficiency type ?In bloodstream > 3 mins ?Contact w/fluids prior to insertion  ?Device damaged on visual inspection ?Device damaged on removal/prep  ?Balloon rupture ?Balloon didn't inflate/deflate properly  ?Other_____________________________________________________________ Summary description ______________________________________________________________________ ______________________________________________________________________ ______________________________________________________________________ Any AE's result ?Yes ?NoClick or tap here to enter text.    Procedure - Post-Dilation: ? N/A  Reason: ? Dissection     ? Residual Stenosis      ? Other:    Manufacturer ?Abbott ?AngioDynamics ?Argon ?Arrow ?Bard ?Paulita Cradle   ?Biotronik ?BSC ?COOK ?Cordis ?Deltec ?GORE   ?iVascular ?Kimberly-Clark ?Medtronic ?Merit ?OSCOR ?St. Jude   ?Telefex ?Terumo ?Other__ ?Unk    Model ____________________________________________________________  Balloon Diameter _______________mm Balloon Length _______________mm  # Inflations _______________atm Pressure _______________atm    Major Dissection after all inflations ? Yes      ? No Dissection Grade:   ? A    ? B    ? C    ? E  ? F   ? unk   Final Stenosis: Click or tap here to enter text. % after all inflations Trans-lesional gradient: Click or tap here to enter text.mmHG  Procedure - Bail Out Stenting:  ? N/A  Post Dil attempted prior to bailout: ? Yes    ? No  Residual stenosis: Click or tap here to enter text.  Major Dissection after all inflations: ? Yes     ? No                                                          Dissection Grade: ? A       ? B      ? C      ? D       ? E      ? F      ? Unk  Trans-lesional pressure gradient measured: ? Yes  Click or tap here to enter text.mmHg ? No  How was trans-lesional pressure measured: ? ?39F end-hole catheter        ? Pressure Wire  Stent # Stent used: Manufacturer Model  Diameter Length  #1 ? Yes     ? No      #2 ? Yes     ? No      #3 ? Yes     ? No      Final Stenosis after bail out stenting: Click or tap here to enter text.%  Post-deployment angiogram done: ? Yes     ? No  Post procedure - [place] angiogram to [facility]:  ? Yes     ? No      DISCHARGE  DATE: 08/06/2016     TIME: 1330 pm  AE's since procedure:  ?  Yes      ? No  ASA continuing at DC:  ? Yes      ? No  Thienopyridine prescribed at DC:  ? Yes      ? No   ? already on medication        Continuing: ? Yes    ? No     Coordinator: Philemon Kingdom :) RN  08/06/16  Walking Impairment Questionnaire (WIQ) 1a. Date WIQ completed: 2016-08-06  1b. Time: (HH:MM)  0630  Num Question Very  Much Some Slight None  A. Walking Impairment  2a. Pain, aching, or cramps in calves (or buttocks)? ? ? ? ? ?  2b. If 2a ? None, please indicate which Leg: Both  3a. Pain or aching in thighs? ? ? ? ? ?  3b. If 3a ? None, please indicate which Leg Right  4.  Pain, stiffness, or aching in joints (ankles, knees, or hips) ? ? ? ? ?  5. Weakness in one or both legs ? ? ? ? ?  6. Pain or discomfort in your chest ? ? ? ? ?  7. Shortness of breath ? ? ? ? ?  8.  Heart Palpitations ? ? ? ? ?           9.              Other problems   Num Question Unable Much Some Slight None  B. Walking Distance  10. Walking indoors (i.e. around house) ? ? ? ? ?  11. Walking 50 feet ? ? ? ? ?  12. Walking 150 feet (1/2 block) ? ? ? ? ?          Num Question Unable Much Some Slight None  13. Walking 300 feet (1 block) ? ? ? ? ?  14. Walking 600 feet (2 blocks) ? ? ? ? ?  15.  Walking 900 feet (3 blocks) ? ? ? ? ?  16.  Walking 1500 feet (5 blocks) ? ? ? ? ?  Walking Speed:  17. Walking 1 block slowly ? ? ? ? ?  18.  Walking 1 block at an average speed? ? ? ? ? ?  19. Walking 1 block quickly? ? ? ? ? ?  20. Running or jogging 1 block ? ? ? ? ?  Stair Climbing:  21. Climbing 1 flight of stairs ? ? ? ? ?  22. Climbing 2 flights of stairs ? ? ? ? ?  23. Climbing 3 flights of stairs. ? ? ? ? ?   Coordinator Signature: Philemon Kingdom :)    Date: 08/06/2016   RUTHERFORD CLASSIFICATION:  ? 0:  Asymptomatic, no hemodynamically significant occlusive disease ? 1:  Mild Claudication ? 2. Moderate Claudication ? 3. Severe Claudication ? 4. Ischemic rest pain ? 5. Minor tissue loss, non-healing ulcer, or focal gangrene with diffuse pedal ischemia ? 6. Major tissue loss, extending above trans-metatarsal level, functional foot no longer salvageable.   [facility] Clinical Symptom Classification:  ? Asymptomatic ? Mild Claudication/limb symptoms (no limit walking) ? Moderate claudication/limb symptoms (able to walk  without stopping >2 blocks or 200 meters or 4 minutes). ? Severe claudication/limb symptoms (only able to walk without stoping <2 blocks or 200 meters or 39minutes).  ? Ischemic rest pain (pain in the distal limb at rest, felt to be due to limited arterial perfusion). ? Ischemic ulcers on distal leg ? Ischemic gangrene  Coordinator Signature: Philemon Kingdom :)  Date: 08/06/2016

## 2016-09-02 DIAGNOSIS — Z131 Encounter for screening for diabetes mellitus: Secondary | ICD-10-CM | POA: Diagnosis not present

## 2016-09-02 DIAGNOSIS — Z720 Tobacco use: Secondary | ICD-10-CM | POA: Diagnosis not present

## 2016-09-02 DIAGNOSIS — G4733 Obstructive sleep apnea (adult) (pediatric): Secondary | ICD-10-CM | POA: Diagnosis not present

## 2016-09-02 DIAGNOSIS — I10 Essential (primary) hypertension: Secondary | ICD-10-CM | POA: Diagnosis not present

## 2016-09-02 DIAGNOSIS — E559 Vitamin D deficiency, unspecified: Secondary | ICD-10-CM | POA: Diagnosis not present

## 2016-09-02 DIAGNOSIS — Z13228 Encounter for screening for other metabolic disorders: Secondary | ICD-10-CM | POA: Diagnosis not present

## 2016-09-02 DIAGNOSIS — D510 Vitamin B12 deficiency anemia due to intrinsic factor deficiency: Secondary | ICD-10-CM | POA: Diagnosis not present

## 2016-09-02 DIAGNOSIS — G629 Polyneuropathy, unspecified: Secondary | ICD-10-CM | POA: Diagnosis not present

## 2016-09-02 DIAGNOSIS — Z1321 Encounter for screening for nutritional disorder: Secondary | ICD-10-CM | POA: Diagnosis not present

## 2016-09-02 DIAGNOSIS — E662 Morbid (severe) obesity with alveolar hypoventilation: Secondary | ICD-10-CM | POA: Diagnosis not present

## 2016-09-02 DIAGNOSIS — Z13220 Encounter for screening for lipoid disorders: Secondary | ICD-10-CM | POA: Diagnosis not present

## 2016-09-02 DIAGNOSIS — K5903 Drug induced constipation: Secondary | ICD-10-CM | POA: Diagnosis not present

## 2016-09-02 DIAGNOSIS — Z1329 Encounter for screening for other suspected endocrine disorder: Secondary | ICD-10-CM | POA: Diagnosis not present

## 2016-09-10 ENCOUNTER — Ambulatory Visit (HOSPITAL_COMMUNITY)
Admission: RE | Admit: 2016-09-10 | Discharge: 2016-09-10 | Disposition: A | Discharge status: Home / Self Care | Payer: Medicare Other | Source: Ambulatory Visit | Attending: Physician Assistant | Admitting: Physician Assistant

## 2016-09-10 ENCOUNTER — Encounter: Payer: Self-pay | Admitting: *Deleted

## 2016-09-10 DIAGNOSIS — Z006 Encounter for examination for normal comparison and control in clinical research program: Secondary | ICD-10-CM | POA: Insufficient documentation

## 2016-09-10 DIAGNOSIS — I739 Peripheral vascular disease, unspecified: Secondary | ICD-10-CM

## 2016-09-10 LAB — VAS US LOWER EXTREMITY ARTERIAL DUPLEX
Right popliteal dist sys PSV: -77 cm/s
Right popliteal prox sys PSV: 116 cm/s
Right super femoral mid sys PSV: -92 cm/s

## 2016-09-11 ENCOUNTER — Telehealth: Payer: Self-pay | Admitting: *Deleted

## 2016-09-11 ENCOUNTER — Other Ambulatory Visit: Payer: Self-pay | Admitting: Adult Health

## 2016-09-11 DIAGNOSIS — Z1231 Encounter for screening mammogram for malignant neoplasm of breast: Secondary | ICD-10-CM

## 2016-09-11 DIAGNOSIS — R5381 Other malaise: Secondary | ICD-10-CM

## 2016-09-12 NOTE — Telephone Encounter (Signed)
Pt coming back to draw blood 09/12/16 for study.

## 2016-09-17 ENCOUNTER — Other Ambulatory Visit: Payer: Self-pay | Admitting: Adult Health

## 2016-09-17 DIAGNOSIS — E2839 Other primary ovarian failure: Secondary | ICD-10-CM

## 2016-09-17 DIAGNOSIS — M8580 Other specified disorders of bone density and structure, unspecified site: Secondary | ICD-10-CM

## 2016-09-24 DIAGNOSIS — M150 Primary generalized (osteo)arthritis: Secondary | ICD-10-CM | POA: Diagnosis not present

## 2016-09-24 DIAGNOSIS — Z79891 Long term (current) use of opiate analgesic: Secondary | ICD-10-CM | POA: Diagnosis not present

## 2016-09-24 DIAGNOSIS — M47812 Spondylosis without myelopathy or radiculopathy, cervical region: Secondary | ICD-10-CM | POA: Diagnosis not present

## 2016-09-24 DIAGNOSIS — G894 Chronic pain syndrome: Secondary | ICD-10-CM | POA: Diagnosis not present

## 2016-09-24 NOTE — Progress Notes (Signed)
Pt came today for her 1 month follow up TRANSCEND study appointment. She is doing fairly well. She was just diagnosed with neuropathy by her PCP. She was prescribed Wellbutrin to help her smoking cessation by Dr Einar Gip and given Gabapentin from her PCP. She told me that she hasn't started the medications yet, but she would soon. Counseled her about smoking and the importance in stopping. Told her that I would see her back in 6 months to complete ABI's, no later than 19/Feb/2019.   Physical Exam: Date: 09/10/16 Time: 1030 AM Weight: 90.9 kg  Height: 153.5 cm BP: 154/75 Access Site (1 month follow up only): ? site looks good no hematoma Target Limb:  Right Leg - Pulses Femoral not assessed; Popliteal +2; Dorsalis pedis +2;   Posterior tibial +2   Left leg - not assessed Access site examined - yes  No abnormalities were observed. Check medications: ? RUTHERFORD CLASSIFICATION:  ? 0:  Asymptomatic, no hemodynamically significant occlusive disease ? 1:  Mild Claudication ? 2. Moderate Claudication ? 3. Severe Claudication ? 4. Ischemic rest pain ? 5. Minor tissue loss, non-healing ulcer, or focal gangrene with diffuse pedal ischemia ? 6. Major tissue loss, extending above trans-metatarsal level, functional foot no longer salvageable.  [facility] Clinical Symptom Classification:  ? Asymptomatic ? Mild Claudication/limb symptoms (no limit walking) ? Moderate claudication/limb symptoms (able to walk without stopping >2 blocks or 200 meters or 4 minutes). ? Severe claudication/limb symptoms (only able to walk without stopping <2 blocks or 200 meters or 29minutes).  ? Ischemic rest pain (pain in the distal limb at rest, felt to be due to limited arterial perfusion). ? Ischemic ulcers on distal leg ? Ischemic gangrene Labs: CBC ? and CMP ? at 1 month visit only ABI's: (6, 12, and 24 month) ?  PAQ: (1 month, 12 and 24 only) ? see below WIQ: (1 month, 12 and 24 only) ? see below 6 MWT: (12 and 24  month) ? DUS: (1 month, 12 and 24 month) ?  Please see EPIC for results.   THE PERIPHERAL ARTERIAL QUESITONNAIRE The following questions refer to blockages in the arteries of your body, particularly your legs, and how that might affect your life. Please read and complete the following questions. There are no right or wrong answers. Please mark the answer that best applies to you.  1. Blockages in the arteries, often referred to as peripheral vascular disease, affect different people in different ways. Some feel cramping or aching while others feel fatigue. Which leg (or buttock) causes you the most severe discomfort, fatigue, pain, aching, or cramps? The RIGHT leg (buttock) the LEFT leg (buttocks) Both are the same Neither  ?    ?    ?       ?  2. Please review the list below and indicate how much limitation you have due to your peripheral vascular disease (discomfort, fatigue, pain, aching, or cramps in your calves (or buttocks)) over the past 4 weeks.   Place an X in one box on each line  Activity Extremely Limited Quite a bit Limited Moderately Limited Slightly Limited Not at all  Limited Limited for other reasons or did not do the activity.  Walking around your home ? ? ? ? ? ?  Walking 1-2 blocks on level ground ? ? ? ? ? ?  Walking 1-2 blocks up a hill ? ? ? ? ? ?  Walking 3-4 blocks on level ground ? ? ? ? ? ?  Hurrying or jogging (as if to catch a bus) ? ? ? ? ? ?  Vigorous work or exercise ? ? ? ? ? ?   3. Compared with 4 weeks ago, have your symptoms of peripheral vascular disease (discomfort, fatigue, pain, aching, or cramps in your calves (or buttocks) changed? My symptoms have become.  Much Worse Slightly worse Not changed Slightly better Much Better I have had no symptoms over the past 4 weeks  ? ? ? ? ? ?         4. Over the past 4 weeks, how many times did you have discomfort, fatigue, pain, aching, or cramps in your calves (or buttocks)? All of the time Several times  per day At least once a day 3 or more times per weeks but not every day 1-2 times per week Less than once a week Never over the past 4 weeks  ? ? ? ? ? ? ?     5. Over the past 4 weeks, how many times did you have discomfort, fatigue, pain, aching, or cramps in your calves (or buttocks) bothered you?  It has been. Extremely bothersome Moderately bothersome Somewhat bothersome Slightly bothersome Not at all bothersome I've had no leg discomfort  ? ? ? ? ? ?   6. Over the past 4 weeks, how often have you been awakened with pain, aching, or cramps in your legs or feet? Every night 3 or more times per week but not every night 1-2 times per week Less than once a week  Never over the past 4 weeks   ? ? ? ? ?   7. How satisfied are you that everything possible is being done to treat your peripheral vascular disease? Not satisfied at all  Mostly dissatisfied Somewhat satisfied Mostly satisfied Completely satisfied  ? ?  ?  ?  ?    8. How satisfied are you with the explanations your doctor has given you about your peripheral vascular disease? Not satisfied at all  Mostly dissatisfied Somewhat satisfied Mostly satisfied Completely satisfied  ? ?  ?  ?  ?    9. Overall, how satisfied are you with the current treatment of your peripheral vascular disease? Not satisfied at all  Mostly dissatisfied Somewhat satisfied Mostly satisfied Completely satisfied  ? ?  ?  ?  ?    10. Over the past 4 weeks, how much has your peripheral vascular disease limited your enjoyment of life? It has extremely limited my enjoyment of life It has limited my enjoyment quite a bit  It has moderately limited my enjoyment of life It has slightly limited my enjoyment of life It has not limited my enjoyment of life at all   ? ? ? ? ?   38. If you had to spend the rest of your life with your peripheral vascular disease the way it is right now, how would you feel about this? Not satisfied at all  Mostly dissatisfied Somewhat  satisfied Mostly satisfied Completely satisfied  ? ?  ?  ?  ?    12. Over the past 4 weeks, how often have you felt discouraged or down in the dumps because of your peripheral vascular disease? I felt that way all of the time I felt that way most of the time I occasionally felt that way  I rarely felt that way I never felt that way  ? ? ? ? ?   13. How much does your peripheral vascular  disease affect your lifestyle? Please indicate how your discomfort, fatigue, pain, aching, or cramps in your calves (or buttocks) may have limited your participation in the following activities over the past 4 weeks.   Please place an X in one box on each line Activity Severely limited Limited quite a bit Moderately limited Slightly limited Did not limit at all Does not apply or did not do for other reasons  Hobbies, recreational activities ? ? ? ? ? ?  Visiting family or friends out of your home ? ? ? ? ? ?  Working or doing household chores ? ? ? ? ? ?   Coordinator Signature: Philemon Kingdom :)    Date: 09/10/2016  Walking Impairment Questionnaire (WIQ) 1a. Date WIQ completed: 2016-09-10  1b. Time: (HH:MM)  1030  Num Question Very Much Some Slight None  A. Walking Impairment  2a. Pain, aching, or cramps in calves (or buttocks)? ? ? ? ? ?  2b. If 2a ? None, please indicate which Leg: Both  3a. Pain or aching in thighs? ? ? ? ? ?  3b. If 3a ? None, please indicate which Leg Choose an item.  4.  Pain, stiffness, or aching in joints (ankles, knees, or hips) ? ? ? ? ?  5. Weakness in one or both legs ? ? ? ? ?  6. Pain or discomfort in your chest ? ? ? ? ?  7. Shortness of breath ? ? ? ? ?  8.  Heart Palpitations ? ? ? ? ?           9.              Other problems   Num Question Unable Much Some Slight None  B. Walking Distance  10. Walking indoors (i.e. around house) ? ? ? ? ?  11. Walking 50 feet ? ? ? ? ?  12. Walking 150 feet (1/2 block) ? ? ? ? ?          Num Question Unable Much Some Slight  None  13. Walking 300 feet (1 block) ? ? ? ? ?  14. Walking 600 feet (2 blocks) ? ? ? ? ?  15.  Walking 900 feet (3 blocks) ? ? ? ? ?  16.  Walking 1500 feet (5 blocks) ? ? ? ? ?  Walking Speed:  17. Walking 1 block slowly ? ? ? ? ?  18.  Walking 1 block at an average speed? ? ? ? ? ?  19. Walking 1 block quickly? ? ? ? ? ?  20. Running or jogging 1 block ? ? ? ? ?  Stair Climbing:  21. Climbing 1 flight of stairs ? ? ? ? ?  22. Climbing 2 flights of stairs ? ? ? ? ?  23. Climbing 3 flights of stairs. ? ? ? ? ?   Coordinator Signature: Philemon Kingdom :)    Date: 09/10/2016

## 2016-09-30 DIAGNOSIS — G4730 Sleep apnea, unspecified: Secondary | ICD-10-CM | POA: Diagnosis not present

## 2016-10-14 ENCOUNTER — Telehealth: Payer: Self-pay | Admitting: Neurology

## 2016-10-14 HISTORY — PX: COLONOSCOPY: SHX174

## 2016-10-14 HISTORY — PX: UPPER GI ENDOSCOPY: SHX6162

## 2016-10-14 NOTE — Telephone Encounter (Signed)
Patient called needing to see about an MRI that Dr. Delice Lesch wanted her to have. Please Advise. Thank you

## 2016-10-16 ENCOUNTER — Other Ambulatory Visit: Payer: Self-pay

## 2016-10-16 DIAGNOSIS — M542 Cervicalgia: Secondary | ICD-10-CM

## 2016-10-22 ENCOUNTER — Encounter: Payer: Self-pay | Admitting: Internal Medicine

## 2016-10-22 ENCOUNTER — Ambulatory Visit (AMBULATORY_SURGERY_CENTER): Payer: Medicare Other | Admitting: Internal Medicine

## 2016-10-22 ENCOUNTER — Other Ambulatory Visit: Payer: Self-pay

## 2016-10-22 VITALS — BP 168/86 | HR 98 | Temp 98.6°F | Resp 13 | Ht 60.5 in | Wt 200.0 lb

## 2016-10-22 DIAGNOSIS — D500 Iron deficiency anemia secondary to blood loss (chronic): Secondary | ICD-10-CM

## 2016-10-22 DIAGNOSIS — D123 Benign neoplasm of transverse colon: Secondary | ICD-10-CM | POA: Diagnosis not present

## 2016-10-22 DIAGNOSIS — Z86010 Personal history of colonic polyps: Secondary | ICD-10-CM | POA: Diagnosis not present

## 2016-10-22 DIAGNOSIS — R1310 Dysphagia, unspecified: Secondary | ICD-10-CM

## 2016-10-22 DIAGNOSIS — K253 Acute gastric ulcer without hemorrhage or perforation: Secondary | ICD-10-CM | POA: Diagnosis not present

## 2016-10-22 DIAGNOSIS — K635 Polyp of colon: Secondary | ICD-10-CM

## 2016-10-22 DIAGNOSIS — D509 Iron deficiency anemia, unspecified: Secondary | ICD-10-CM

## 2016-10-22 DIAGNOSIS — K210 Gastro-esophageal reflux disease with esophagitis: Secondary | ICD-10-CM

## 2016-10-22 MED ORDER — SODIUM CHLORIDE 0.9 % IV SOLN: 500.0000 mL | INTRAVENOUS | Status: DC

## 2016-10-22 NOTE — Patient Instructions (Signed)
Begin taking Omeprazole 20mg . You may obtain this Over the counter. STOP SMOKING ** Continue ORAL iron therapy TWICE DAILY **  RESUME PLAVIX TODAY!   Make appt to see Dr Henrene Pastor in the office in 6 weeks. Go to lab before office visit to have blood work drawn.  **Handouts given on polyps, diverticulosis, and hemorrhoids**   YOU HAD AN ENDOSCOPIC PROCEDURE TODAY AT [facility]:   Refer to the procedure report that was given to you for any specific questions about what was found during the examination.  If the procedure report does not answer your questions, please call your gastroenterologist to clarify.  If you requested that your care partner not be given the details of your procedure findings, then the procedure report has been included in a sealed envelope for you to review at your convenience later.  YOU SHOULD EXPECT: Some feelings of bloating in the abdomen. Passage of more gas than usual.  Walking can help get rid of the air that was put into your GI tract during the procedure and reduce the bloating. If you had a lower endoscopy (such as a colonoscopy or flexible sigmoidoscopy) you may notice spotting of blood in your stool or on the toilet paper. If you underwent a bowel prep for your procedure, you may not have a normal bowel movement for a few days.  Please Note:  You might notice some irritation and congestion in your nose or some drainage.  This is from the oxygen used during your procedure.  There is no need for concern and it should clear up in a day or so.  SYMPTOMS TO REPORT IMMEDIATELY:   Following lower endoscopy (colonoscopy or flexible sigmoidoscopy):  Excessive amounts of blood in the stool  Significant tenderness or worsening of abdominal pains  Swelling of the abdomen that is new, acute  Fever of 100F or higher   Following upper endoscopy (EGD)  Vomiting of blood or coffee ground material  New chest pain or pain under the shoulder blades  Painful or  persistently difficult swallowing  New shortness of breath  Fever of 100F or higher  Black, tarry-looking stools  For urgent or emergent issues, a gastroenterologist can be reached at any hour by calling [phone].   DIET:  We do recommend a small meal at first, but then you may proceed to your regular diet.  Drink plenty of fluids but you should avoid alcoholic beverages for 24 hours.  ACTIVITY:  You should plan to take it easy for the rest of today and you should NOT DRIVE or use heavy machinery until tomorrow (because of the sedation medicines used during the test).    FOLLOW UP: Our staff will call the number listed on your records the next business day following your procedure to check on you and address any questions or concerns that you may have regarding the information given to you following your procedure. If we do not reach you, we will leave a message.  However, if you are feeling well and you are not experiencing any problems, there is no need to return our call.  We will assume that you have returned to your regular daily activities without incident.  If any biopsies were taken you will be contacted by phone or by letter within the next 1-3 weeks.  Please call us at [phone] if you have not heard about the biopsies in 3 weeks.    SIGNATURES/CONFIDENTIALITY: You and/or your care partner have signed paperwork  which will be entered into your electronic medical record.  These signatures attest to the fact that that the information above on your After Visit Summary has been reviewed and is understood.  Full responsibility of the confidentiality of this discharge information lies with you and/or your care-partner.

## 2016-10-22 NOTE — Progress Notes (Signed)
Called to room to assist during endoscopic procedure.  Patient ID and intended procedure confirmed with present staff. Received instructions for my participation in the procedure from the performing physician.  

## 2016-10-22 NOTE — Op Note (Signed)
[facility] Patient Name: [name] Procedure Date: 10/22/2016 2:57 PM MRN: [id number] Endoscopist: Docia Chuck. Henrene Pastor , MD Age: 66 Referring MD:  Date of Birth: [birth year] Gender: Female Account #: [id number] Procedure:                Upper GI endoscopy, with biopsies Indications:              Iron deficiency anemia, Dysphagia Medicines:                Monitored Anesthesia Care Procedure:                Pre-Anesthesia Assessment:                           - Prior to the procedure, a History and Physical                            was performed, and patient medications and                            allergies were reviewed. The patient's tolerance of                            previous anesthesia was also reviewed. The risks                            and benefits of the procedure and the sedation                            options and risks were discussed with the patient.                            All questions were answered, and informed consent                            was obtained. Prior Anticoagulants: The patient has                            taken Plavix (clopidogrel), last dose was 7 days                            prior to procedure. ASA Grade Assessment: III - A                            patient with severe systemic disease. After                            reviewing the risks and benefits, the patient was                            deemed in satisfactory condition to undergo the                            procedure.  After obtaining informed consent, the endoscope was                            passed under direct vision. Throughout the                            procedure, the patient's blood pressure, pulse, and                            oxygen saturations were monitored continuously. The                            Model GIF-[id number]) scope was introduced                            through the mouth, and advanced to the  third part                            of duodenum. The upper GI endoscopy was                            accomplished without difficulty. The patient                            tolerated the procedure well. Scope In: Scope Out: Findings:                 LA Grade A (one or more mucosal breaks less than 5                            mm, not extending between tops of 2 mucosal folds)                            esophagitis was found. There was significant edema.                           The esophagus was otherwise normal.                           A few small erosions were found in the gastric                            antrum. Biopsies were taken with a cold forceps for                            Helicobacter pylori testing using CLOtest.                           The stomach was otherwise normal.                           The examined duodenum was normal.                           The cardia  and gastric fundus were normal on                            retroflexion. Complications:            No immediate complications. Estimated Blood Loss:     Estimated blood loss: none. Impression:               - LA Grade A reflux esophagitis.                           - Normal esophagus.                           - Erosive gastropathy. Biopsied.                           - Normal stomach.                           - Normal examined duodenum. Recommendation:           1. Begin OMEPRAZOLE 20 mg daily. You may obtain                            this over-the-counter.                           2. STOP SMOKING                           3. Continue oral IRON therapy TWICE daily                           4. Resume PLAVIX TODAY                           5. Office follow-up with Dr. Henrene Pastor in about 6 weeks                           6. Please stop by the laboratory to have blood work                            a few days before your office visit. CBC and                            ferritin level                            7. Resume general medical care with PCP and other                            specialists Docia Chuck. Henrene Pastor, MD 10/22/2016 3:46:36 PM This report has been signed electronically.

## 2016-10-22 NOTE — Progress Notes (Signed)
Spontaneous respirations throughout. VSS. Resting comfortably. To PACU on room air. Report to  RN. 

## 2016-10-22 NOTE — Op Note (Signed)
[facility] Patient Name: [name] Procedure Date: 10/22/2016 2:57 PM MRN: [id number] Endoscopist: Docia Chuck. Henrene Pastor , MD Age: 66 Referring MD:  Date of Birth: 10-09-1950 Gender: Female Account #: [id number] Procedure:                Colonoscopy, with cold snare polypectomy x 2 Indications:              Iron deficiency anemia Medicines:                Monitored Anesthesia Care Procedure:                Pre-Anesthesia Assessment:                           - Prior to the procedure, a History and Physical                            was performed, and patient medications and                            allergies were reviewed. The patient's tolerance of                            previous anesthesia was also reviewed. The risks                            and benefits of the procedure and the sedation                            options and risks were discussed with the patient.                            All questions were answered, and informed consent                            was obtained. Prior Anticoagulants: The patient has                            taken Plavix (clopidogrel), last dose was 7 days                            prior to procedure. ASA Grade Assessment: III - A                            patient with severe systemic disease. After                            reviewing the risks and benefits, the patient was                            deemed in satisfactory condition to undergo the                            procedure.  After obtaining informed consent, the colonoscope                            was passed under direct vision. Throughout the                            procedure, the patient's blood pressure, pulse, and                            oxygen saturations were monitored continuously. The                            Colonoscope was introduced through the anus and                            advanced to the the cecum, identified  by                            appendiceal orifice and ileocecal valve. The                            ileocecal valve, appendiceal orifice, and rectum                            were photographed. The quality of the bowel                            preparation was adequate to identify polyps. The                            colonoscopy was performed without difficulty. The                            patient tolerated the procedure well. The bowel                            preparation used was SUPREP. Scope In: 3:06:50 PM Scope Out: 3:30:04 PM Scope Withdrawal Time: 0 hours 11 minutes 4 seconds  Total Procedure Duration: 0 hours 23 minutes 14 seconds  Findings:                 Two polyps were found in the transverse colon and                            ascending colon. The polyps were 3 to 4 mm in size.                            These polyps were removed with a cold snare.                            Resection and retrieval were complete.                           Multiple small and large-mouthed diverticula were  found in the sigmoid colon.                           Internal hemorrhoids were found during                            retroflexion. The hemorrhoids were small.                           The exam was otherwise without abnormality on                            direct and retroflexion views. Complications:            No immediate complications. Estimated blood loss:                            None. Estimated Blood Loss:     Estimated blood loss: none. Impression:               - Two 3 to 4 mm polyps in the transverse colon and                            in the ascending colon, removed with a cold snare.                            Resected and retrieved.                           - Diverticulosis in the sigmoid colon.                           - Internal hemorrhoids.                           - The examination was otherwise normal on direct                             and retroflexion views. Recommendation:           - Repeat colonoscopy in 5-10 years for surveillance.                           - Resume Plavix (clopidogrel) today at prior dose.                           - Patient has a contact number available for                            emergencies. The signs and symptoms of potential                            delayed complications were discussed with the                            patient. Return to normal activities tomorrow.  Written discharge instructions were provided to the                            patient.                           - Resume previous diet.                           - Continue present medications.                           - Await pathology results.                           - Please seeEGD report for final recommendations. Docia Chuck. Henrene Pastor, MD 10/22/2016 3:40:08 PM This report has been signed electronically.

## 2016-10-23 ENCOUNTER — Telehealth: Payer: Self-pay

## 2016-10-23 DIAGNOSIS — M7721 Periarthritis, right wrist: Secondary | ICD-10-CM | POA: Diagnosis not present

## 2016-10-23 DIAGNOSIS — M62838 Other muscle spasm: Secondary | ICD-10-CM | POA: Diagnosis not present

## 2016-10-23 DIAGNOSIS — M7722 Periarthritis, left wrist: Secondary | ICD-10-CM | POA: Diagnosis not present

## 2016-10-23 DIAGNOSIS — G894 Chronic pain syndrome: Secondary | ICD-10-CM | POA: Diagnosis not present

## 2016-10-23 DIAGNOSIS — F4321 Adjustment disorder with depressed mood: Secondary | ICD-10-CM | POA: Diagnosis not present

## 2016-10-23 DIAGNOSIS — M4726 Other spondylosis with radiculopathy, lumbar region: Secondary | ICD-10-CM | POA: Diagnosis not present

## 2016-10-23 DIAGNOSIS — M150 Primary generalized (osteo)arthritis: Secondary | ICD-10-CM | POA: Diagnosis not present

## 2016-10-23 DIAGNOSIS — Z79891 Long term (current) use of opiate analgesic: Secondary | ICD-10-CM | POA: Diagnosis not present

## 2016-10-23 DIAGNOSIS — G2581 Restless legs syndrome: Secondary | ICD-10-CM | POA: Diagnosis not present

## 2016-10-23 DIAGNOSIS — M47812 Spondylosis without myelopathy or radiculopathy, cervical region: Secondary | ICD-10-CM | POA: Diagnosis not present

## 2016-10-23 NOTE — Telephone Encounter (Signed)
  Follow up Call-  Call back number 10/22/2016  Post procedure Call Back phone  # [phone]  Permission to leave phone message Yes  Some recent data might be hidden     Patient questions:  Do you have a fever, pain , or abdominal swelling? No. Pain Score  0 *  Have you tolerated food without any problems? Yes.    Have you been able to return to your normal activities? Yes.    Do you have any questions about your discharge instructions: Diet   No. Medications  No. Follow up visit  No.  Do you have questions or concerns about your Care? No.  Actions: * If pain score is 4 or above: No action needed, pain <4.  No problems noted per pt. maw

## 2016-10-25 ENCOUNTER — Other Ambulatory Visit: Payer: Medicare Other

## 2016-10-25 LAB — HELICOBACTER PYLORI SCREEN-BIOPSY: UREASE: NEGATIVE

## 2016-10-28 ENCOUNTER — Encounter: Payer: Self-pay | Admitting: Internal Medicine

## 2016-11-07 DIAGNOSIS — Z1231 Encounter for screening mammogram for malignant neoplasm of breast: Secondary | ICD-10-CM | POA: Diagnosis not present

## 2016-11-18 ENCOUNTER — Other Ambulatory Visit: Payer: Self-pay | Admitting: Physician Assistant

## 2016-11-18 DIAGNOSIS — I10 Essential (primary) hypertension: Secondary | ICD-10-CM | POA: Diagnosis not present

## 2016-11-18 DIAGNOSIS — I701 Atherosclerosis of renal artery: Secondary | ICD-10-CM | POA: Diagnosis not present

## 2016-11-18 DIAGNOSIS — I209 Angina pectoris, unspecified: Secondary | ICD-10-CM | POA: Diagnosis not present

## 2016-11-18 DIAGNOSIS — I739 Peripheral vascular disease, unspecified: Secondary | ICD-10-CM | POA: Diagnosis not present

## 2016-11-20 DIAGNOSIS — G894 Chronic pain syndrome: Secondary | ICD-10-CM | POA: Diagnosis not present

## 2016-11-20 DIAGNOSIS — M47812 Spondylosis without myelopathy or radiculopathy, cervical region: Secondary | ICD-10-CM | POA: Diagnosis not present

## 2016-11-20 DIAGNOSIS — Z79891 Long term (current) use of opiate analgesic: Secondary | ICD-10-CM | POA: Diagnosis not present

## 2016-11-20 DIAGNOSIS — M150 Primary generalized (osteo)arthritis: Secondary | ICD-10-CM | POA: Diagnosis not present

## 2016-12-02 DIAGNOSIS — R0981 Nasal congestion: Secondary | ICD-10-CM | POA: Diagnosis not present

## 2016-12-02 DIAGNOSIS — H722X2 Other marginal perforations of tympanic membrane, left ear: Secondary | ICD-10-CM | POA: Diagnosis not present

## 2016-12-02 DIAGNOSIS — H9202 Otalgia, left ear: Secondary | ICD-10-CM | POA: Diagnosis not present

## 2016-12-02 DIAGNOSIS — R05 Cough: Secondary | ICD-10-CM | POA: Diagnosis not present

## 2016-12-16 ENCOUNTER — Ambulatory Visit: Payer: Medicare Other | Admitting: Internal Medicine

## 2016-12-19 DIAGNOSIS — G894 Chronic pain syndrome: Secondary | ICD-10-CM | POA: Diagnosis not present

## 2016-12-19 DIAGNOSIS — Z79891 Long term (current) use of opiate analgesic: Secondary | ICD-10-CM | POA: Diagnosis not present

## 2016-12-19 DIAGNOSIS — M150 Primary generalized (osteo)arthritis: Secondary | ICD-10-CM | POA: Diagnosis not present

## 2016-12-19 DIAGNOSIS — M47812 Spondylosis without myelopathy or radiculopathy, cervical region: Secondary | ICD-10-CM | POA: Diagnosis not present

## 2016-12-30 ENCOUNTER — Ambulatory Visit: Payer: Medicare Other | Admitting: Neurology

## 2017-01-01 NOTE — Progress Notes (Signed)
Office Visit Note  Patient: [name]             Date of Birth: Jun 26, 1950           MRN: [id number]             PCP: Patient, No Pcp Per Referring: No ref. provider found Visit Date: 01/09/2017 Occupation: '@GUAROCC' @    Subjective:  Pain hands and Raynaud's phenomenon.   History of Present Illness: [name] is a 66 y.o. female seen in consultation per request of her PCP. According to patient she has had history of osteoarthritis and disc disease for multiple years. She states she has had restless leg syndrome in her bilateral lower extremities for at least 10 years. She has had 3 arthroscopic surgeries on her left knee joint and right total knee replacement in December 2017 by Dr. Veverly Fells. She continues to have pain and discomfort in her right knee. She's also had left total hip replacement about 15 years ago which continues to cause discomfort. She has disc disease of lumbar spine and had been seen by Dr. Arnette Norris and pain management. She also had surgery on her C-spine due to underlying disc disease and she continues to have discomfort and popping sensation in her C-spine. She states recently she's been having pain and stiffness in her bilateral hands. She has had history of bilateral carpal tunnel release and bilateral CMC surgery in the past. Due to multiple arthralgias she was evaluated by her PCP and had labs which came positive for ANA. She was referred here for further evaluation. She gives history of Raynaud's phenomenon.  Activities of Daily Living:  Patient reports morning stiffness for 5 minutes.   Patient Reports nocturnal pain.  Difficulty dressing/grooming: Denies Difficulty climbing stairs: Reports Difficulty getting out of chair: Reports Difficulty using hands for taps, buttons, cutlery, and/or writing: Reports   Review of Systems  Constitutional: Positive for fatigue and weakness. Negative for night sweats, weight gain and weight loss.  HENT: Positive  for mouth dryness. Negative for mouth sores, trouble swallowing, trouble swallowing and nose dryness.   Eyes: Positive for dryness. Negative for pain, redness and visual disturbance.  Respiratory: Negative for cough, shortness of breath and difficulty breathing.   Cardiovascular: Positive for hypertension and swelling in legs/feet. Negative for chest pain, palpitations and irregular heartbeat.  Gastrointestinal: Negative.  Negative for blood in stool, constipation and diarrhea.  Endocrine: Negative for increased urination.  Genitourinary: Negative for vaginal dryness.  Musculoskeletal: Positive for arthralgias, joint pain, myalgias, morning stiffness and myalgias. Negative for joint swelling, muscle weakness and muscle tenderness.  Skin: Positive for color change. Negative for rash, hair loss, skin tightness, ulcers and sensitivity to sunlight.  Allergic/Immunologic: Negative for susceptible to infections.  Neurological: Negative for dizziness, numbness, headaches, memory loss and night sweats.  Hematological: Negative for swollen glands.  Psychiatric/Behavioral: Positive for depressed mood. Negative for sleep disturbance. The patient is not nervous/anxious.     PMFS History:  Patient Active Problem List   Diagnosis Date Noted  . Neck pain on left side 08/05/2016  . Claudication in peripheral vascular disease ([place]) 07/14/2016  . H/O total knee replacement, right 01/12/2016  . Pain in joint, lower leg 05/09/2014  . Convulsion ([place]) 01/17/2014  . Left-sided weakness 01/17/2014  . 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 Left hand weakness 08/24/2012  . CERVICAL CANCER 04/03/2007  . COLONIC POLYPS 04/03/2007  . OVERWEIGHT 04/03/2007  . ANXIETY DEPRESSION 04/03/2007  . TOBACCO  ABUSE 04/03/2007  . Essential hypertension 04/03/2007  . INTERNAL HEMORRHOIDS 04/03/2007  . GERD 04/03/2007  . Gastroparesis 04/03/2007  . Diverticulosis of colon 04/03/2007  . BACK PAIN, CHRONIC 04/03/2007    . CHEST PAIN, ATYPICAL 04/03/2007    Past Medical History:  Diagnosis Date  . Arthritis   . Cancer (HCC)    cervical  . COPD (chronic obstructive pulmonary disease) (HCC)    wheezing  . GERD (gastroesophageal reflux disease)   . Hypertension   . Liver disease   . Pre-diabetes   . Seizures ([place])    no meds for 8 months  . Sleep apnea    does not use her cpap  . Stroke [facility]) 2015   no weakness  . 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 Hyperlipidemia Mother   . Hypertension Mother   . Hyperlipidemia Father   . Hypertension Father   . Alzheimer's disease Father   . Stroke Brother   . Cancer Daughter        unknown  . Colon cancer Neg Hx   . Esophageal cancer Neg Hx   . Rectal cancer Neg Hx   . Stomach cancer Neg Hx    Past Surgical History:  Procedure Laterality Date  . BACK SURGERY    . CESAREAN SECTION    . CHOLECYSTECTOMY    . HAND SURGERY Bilateral    carpal tunnel  . HEMORRHOID SURGERY    . KNEE SURGERY Left    arthroscopy x3  . LOWER EXTREMITY ANGIOGRAPHY N/A 07/16/2016   Procedure: Lower Extremity Angiography;  Surgeon: Adrian Prows, MD;  Location: [place] [facility];  Service: Cardiovascular;  Laterality: N/A;  . LOWER EXTREMITY INTERVENTION N/A 08/06/2016   Procedure: Lower Extremity Intervention;  Surgeon: Adrian Prows, MD;  Location: [place] [facility];  Service: Cardiovascular;  Laterality: N/A;  . NECK SURGERY    . OTHER SURGICAL HISTORY  2013   bladder stimulator in back  . PERIPHERAL VASCULAR BALLOON ANGIOPLASTY  08/06/2016   Procedure: Peripheral Vascular Balloon Angioplasty;  Surgeon: Adrian Prows, MD;  Location: [place] [facility];  Service: Cardiovascular;;  Right SFA  . TENNIS ELBOW RELEASE/NIRSCHEL PROCEDURE Right 12/2015  . TOE SURGERY Right    right foot second and fourth toe  . TOTAL HIP ARTHROPLASTY Left   . TOTAL KNEE ARTHROPLASTY Right 01/12/2016   Procedure: RIGHT TOTAL KNEE ARTHROPLASTY;   Surgeon: Netta Cedars, MD;  Location: [place];  Service: Orthopedics;  Laterality: Right;  . TUBAL LIGATION    . VAGINAL HYSTERECTOMY     Social History   Social History Narrative  . Not on file     Objective: Vital Signs: BP 134/71 (BP Location: Left Arm, Patient Position: Sitting, Cuff Size: Normal)   Pulse 90   Ht '5\' 1"'  (1.549 m)   Wt 195 lb (88.5 kg)   BMI 36.84 kg/m    Physical Exam  Constitutional: She is oriented to person, place, and time. She appears well-developed and well-nourished.  HENT:  Head: Normocephalic and atraumatic.  Eyes: Conjunctivae and EOM are normal.  Neck: Normal range of motion.  Cardiovascular: Normal rate, regular rhythm, normal heart sounds and intact distal pulses.  Pulmonary/Chest: Effort normal and breath sounds normal.  Abdominal: Soft. Bowel sounds are normal.  Lymphadenopathy:    She has no cervical adenopathy.  Neurological: She is alert and oriented to person, place, and time.  Skin: Skin  is warm and dry. Capillary refill takes less than 2 seconds.  Psychiatric: She has a normal mood and affect. Her behavior is normal.  Nursing note and vitals reviewed.    Musculoskeletal Exam: C-spine and lumbar spine limited range of motion with some discomfort. Shoulder joints are good range of motion. She has right elbow joint contracture due to prior surgery for epicondylitis. She has DIP PIP thickening in her hands with no synovitis noted. She has limited range of motion of bilateral hip joints she had left total hip replacement. She had warmth and swelling in her right knee joint which is been replaced. Ankle joints and MTPs were good range of motion with some osteoarthritic changes in her PIPs.  CDAI Exam: No CDAI exam completed.    Investigation: No additional findings. 08/05/16: ANA 1:320 nucleolar, RF negative, Sed rate 58  CBC Latest Ref Rng & Units 08/22/2016 01/15/2016 01/14/2016  WBC 4.0 - 10.5 K/uL 5.5 6.3 6.6  Hemoglobin 12.0 - 15.0 g/dL  9.6(L) 10.9(L) 11.2(L)  Hematocrit 36.0 - 46.0 % 30.9(L) 33.5(L) 35.6(L)  Platelets 150.0 - 400.0 K/uL 245.0 189 172   CMP Latest Ref Rng & Units 08/06/2016 08/05/2016 07/31/2016  Glucose 65 - 99 mg/dL 104(H) - 84  BUN 6 - 20 mg/dL '12 12 13  ' Creatinine 0.44 - 1.00 mg/dL 0.80 0.68 0.69  Sodium 135 - 145 mmol/L 136 - 135  Potassium 3.5 - 5.1 mmol/L 3.4(L) - 3.5  Chloride 101 - 111 mmol/L 101 - 101  CO2 22 - 32 mmol/L 29 - 25  Calcium 8.9 - 10.3 mg/dL 8.7(L) - 9.3  Total Protein 6.5 - 8.1 g/dL 6.2(L) - -  Total Bilirubin 0.3 - 1.2 mg/dL 0.3 - -  Alkaline Phos 38 - 126 U/L 112 - -  AST 15 - 41 U/L 17 - -  ALT 14 - 54 U/L 22 - -   Component     Latest Ref Rng & Units 08/05/2016  ANA Pattern 1      NUCLEOLAR (A)  ANA Titer 1     titer 1:320 (H)  Sed Rate     0 - 30 mm/hr 58 (H)  RA Latex Turbid.     <14 IU/mL <14  Anit Nuclear Antibody(ANA)     NEGATIVE POS (A)  BUN     6 - 23 mg/dL 12  Creatinine     0.40 - 1.20 mg/dL 0.68    Imaging: Xr Hand 2 View Left  Result Date: 01/09/2017 No MCP joint space narrowing or erosive changes were noted. Minimal PIP joint space narrowing was noted. Some DIP joint space narrowing was noted. CMC narrowing was noted. No intercarpal radiocarpal joint space narrowing was noted. Impression: These findings are consistent with osteoarthritis of the hand.  Xr Hand 2 View Right  Result Date: 01/09/2017 No MCP joint narrowing or erosive changes were noted. First MCP and all PIP joints this narrowing was noted. Some DIP joint space narrowing was noted. Postsurgical changes in the right [facility] was noted. No intercarpal radiocarpal joint space narrowing was noted. No erosive changes were noted. Impression: These findings are consistent with osteoarthritis of the hand.   Speciality Comments: No specialty comments available.    Procedures:  No procedures performed Allergies: Bee venom; Codeine; Adhesive [tape]; Morphine and related; and Sulfonamide  derivatives   Assessment / Plan:     Visit Diagnoses: Positive ANA (antinuclear antibody) - 08/05/16: 1:320 Nucelolar pattern -patient does not appear to have any clinical features  of autoimmune disease on examination. Nucleolar pattern is usually associated with autoimmune disease like a scleroderma she has no clinical features of scleroderma. To complete the workup I'll obtain following labs today. Plan: CBC with Differential/Platelet, Urinalysis, Routine w reflex microscopic, Sedimentation rate, Serum protein electrophoresis with reflex, ANA, Anti-DNA antibody, double-stranded, Anti-Smith antibody, RNP Antibody, Anti-scleroderma antibody, Sjogrens syndrome-B extractable nuclear antibody, Sjogrens syndrome-A extractable nuclear antibody, C3 and C4  Raynaud's disease without gangrene: Most likely related to smoking. She's been smoking 2 packs per day. Is smoking cessation was discussed at length.  Pain in both hands - History of bilateral carpal tunnel release, history of bilateral CMC surgery. -Clinical features are consistent with osteoarthritis. Plan: XR Hand 2 View Right, XR Hand 2 View Left  Polyarthralgia: She has polyarthralgias due to underlying osteoarthritis and disc disease. She's been going to pain management with Dr. Doren Custard.  DDD (degenerative disc disease), cervical  DDD (degenerative disc disease), lumbar - Followed up by Dr. Arnette Norris at pain management. History of discectomy  History of total left hip replacement: Chronic pain  H/O total knee replacement, right - Dr. Veverly Fells December 2017 [place] she is ongoing pain and discomfort  Elevated ESR: Most likely due to anemia. I'm uncertain about the etiology of anemia. She's been followed by gastroenterology.    ANXIETY DEPRESSION  TOBACCO ABUSE: Smoking cessation was discussed.   Diverticulosis of colon  Essential hypertension  History of gastroesophageal reflux (GERD)  History of colonic polyps  History of epilepsy     Orders: Orders Placed This Encounter  Procedures  . XR Hand 2 View Right  . XR Hand 2 View Left  . CBC with Differential/Platelet  . Urinalysis, Routine w reflex microscopic  . Sedimentation rate  . Serum protein electrophoresis with reflex  . ANA  . Anti-DNA antibody, double-stranded  . Anti-Smith antibody  . RNP Antibody  . Anti-scleroderma antibody  . Sjogrens syndrome-B extractable nuclear antibody  . Sjogrens syndrome-A extractable nuclear antibody  . C3 and C4   No orders of the defined types were placed in this encounter.   Face-to-face time spent with patient was 50 minutes. Greater than 50% of time was spent in counseling and coordination of care.  Follow-Up Instructions: Return for Positive ANA, osteoarthritis.   Bo Merino, MD  Note - This record has been created using Editor, commissioning.  Chart creation errors have been sought, but may not always  have been located. Such creation errors do not reflect on  the standard of medical care.

## 2017-01-09 ENCOUNTER — Ambulatory Visit (INDEPENDENT_AMBULATORY_CARE_PROVIDER_SITE_OTHER): Payer: Self-pay

## 2017-01-09 ENCOUNTER — Encounter: Payer: Self-pay | Admitting: Rheumatology

## 2017-01-09 ENCOUNTER — Ambulatory Visit (INDEPENDENT_AMBULATORY_CARE_PROVIDER_SITE_OTHER): Payer: Medicare Other | Admitting: Rheumatology

## 2017-01-09 VITALS — BP 134/71 | HR 90 | Ht 61.0 in | Wt 195.0 lb

## 2017-01-09 DIAGNOSIS — M79641 Pain in right hand: Secondary | ICD-10-CM | POA: Diagnosis not present

## 2017-01-09 DIAGNOSIS — M2550 Pain in unspecified joint: Secondary | ICD-10-CM

## 2017-01-09 DIAGNOSIS — M51369 Other intervertebral disc degeneration, lumbar region without mention of lumbar back pain or lower extremity pain: Secondary | ICD-10-CM

## 2017-01-09 DIAGNOSIS — Z860100 Personal history of colon polyps, unspecified: Secondary | ICD-10-CM

## 2017-01-09 DIAGNOSIS — Z96651 Presence of right artificial knee joint: Secondary | ICD-10-CM

## 2017-01-09 DIAGNOSIS — R7689 Other specified abnormal immunological findings in serum: Secondary | ICD-10-CM

## 2017-01-09 DIAGNOSIS — M79642 Pain in left hand: Secondary | ICD-10-CM

## 2017-01-09 DIAGNOSIS — Z96642 Presence of left artificial hip joint: Secondary | ICD-10-CM

## 2017-01-09 DIAGNOSIS — K5730 Diverticulosis of large intestine without perforation or abscess without bleeding: Secondary | ICD-10-CM

## 2017-01-09 DIAGNOSIS — Z8719 Personal history of other diseases of the digestive system: Secondary | ICD-10-CM

## 2017-01-09 DIAGNOSIS — I10 Essential (primary) hypertension: Secondary | ICD-10-CM | POA: Diagnosis not present

## 2017-01-09 DIAGNOSIS — M5136 Other intervertebral disc degeneration, lumbar region: Secondary | ICD-10-CM

## 2017-01-09 DIAGNOSIS — I7300 Raynaud's syndrome without gangrene: Secondary | ICD-10-CM

## 2017-01-09 DIAGNOSIS — F17200 Nicotine dependence, unspecified, uncomplicated: Secondary | ICD-10-CM | POA: Diagnosis not present

## 2017-01-09 DIAGNOSIS — Z8669 Personal history of other diseases of the nervous system and sense organs: Secondary | ICD-10-CM

## 2017-01-09 DIAGNOSIS — R768 Other specified abnormal immunological findings in serum: Secondary | ICD-10-CM

## 2017-01-09 DIAGNOSIS — M5030 Other cervical disc degeneration, unspecified cervical region: Secondary | ICD-10-CM

## 2017-01-09 DIAGNOSIS — F341 Dysthymic disorder: Secondary | ICD-10-CM

## 2017-01-13 NOTE — Progress Notes (Signed)
Labs are stable. We will discuss labs at follow-up visit.

## 2017-01-16 LAB — URINALYSIS, ROUTINE W REFLEX MICROSCOPIC
BACTERIA UA: NONE SEEN /HPF
Bilirubin Urine: NEGATIVE
Glucose, UA: NEGATIVE
HGB URINE DIPSTICK: NEGATIVE
HYALINE CAST: NONE SEEN /LPF
Ketones, ur: NEGATIVE
Nitrite: NEGATIVE
PROTEIN: NEGATIVE
RBC / HPF: NONE SEEN /HPF (ref 0–2)
SQUAMOUS EPITHELIAL / LPF: NONE SEEN /HPF (ref <=5)
Specific Gravity, Urine: 1.007 (ref 1.001–1.03)
WBC UA: NONE SEEN /HPF (ref 0–5)
pH: 8 (ref 5.0–8.0)

## 2017-01-16 LAB — CBC WITH DIFFERENTIAL/PLATELET
BASOS ABS: 29 {cells}/uL (ref 0–200)
Basophils Relative: 0.5 %
EOS ABS: 234 {cells}/uL (ref 15–500)
Eosinophils Relative: 4.1 %
HCT: 34.2 % — ABNORMAL LOW (ref 35.0–45.0)
HEMOGLOBIN: 10.3 g/dL — AB (ref 11.7–15.5)
Lymphs Abs: 1340 cells/uL (ref 850–3900)
MCH: 20.9 pg — AB (ref 27.0–33.0)
MCHC: 30.1 g/dL — ABNORMAL LOW (ref 32.0–36.0)
MCV: 69.2 fL — AB (ref 80.0–100.0)
MONOS PCT: 7.2 %
MPV: 9.5 fL (ref 7.5–12.5)
NEUTROS ABS: 3688 {cells}/uL (ref 1500–7800)
Neutrophils Relative %: 64.7 %
PLATELETS: 256 10*3/uL (ref 140–400)
RBC: 4.94 10*6/uL (ref 3.80–5.10)
RDW: 18.1 % — ABNORMAL HIGH (ref 11.0–15.0)
Total Lymphocyte: 23.5 %
WBC mixed population: 410 cells/uL (ref 200–950)
WBC: 5.7 10*3/uL (ref 3.8–10.8)

## 2017-01-16 LAB — ANTI-SMITH ANTIBODY: ENA SM Ab Ser-aCnc: <1 AI

## 2017-01-16 LAB — PROTEIN ELECTROPHORESIS, SERUM, WITH REFLEX
ALPHA 1: 0.4 g/dL — AB (ref 0.2–0.3)
ALPHA 2: 1 g/dL — AB (ref 0.5–0.9)
Albumin ELP: 4 g/dL (ref 3.8–4.8)
Beta 2: 0.5 g/dL (ref 0.2–0.5)
Beta Globulin: 0.6 g/dL (ref 0.4–0.6)
Gamma Globulin: 0.9 g/dL (ref 0.8–1.7)
TOTAL PROTEIN: 7.4 g/dL (ref 6.1–8.1)

## 2017-01-16 LAB — ANA: ANA: POSITIVE — AB

## 2017-01-16 LAB — ANTI-DNA ANTIBODY, DOUBLE-STRANDED

## 2017-01-16 LAB — RNP ANTIBODY: Ribonucleic Protein(ENA) Antibody, IgG: <1 AI

## 2017-01-16 LAB — C3 AND C4
C3 COMPLEMENT: 187 mg/dL (ref 83–193)
C4 Complement: 30 mg/dL (ref 15–57)

## 2017-01-16 LAB — ANTI-SCLERODERMA ANTIBODY: Scleroderma (Scl-70) (ENA) Antibody, IgG: <1 AI

## 2017-01-16 LAB — SJOGRENS SYNDROME-B EXTRACTABLE NUCLEAR ANTIBODY: SSB (LA) (ENA) ANTIBODY, IGG: NEGATIVE AI

## 2017-01-16 LAB — ANTI-NUCLEAR AB-TITER (ANA TITER)

## 2017-01-16 LAB — SEDIMENTATION RATE: SED RATE: 38 mm/h — AB (ref 0–30)

## 2017-01-16 LAB — SJOGRENS SYNDROME-A EXTRACTABLE NUCLEAR ANTIBODY: SSA (RO) (ENA) ANTIBODY, IGG: NEGATIVE AI

## 2017-01-16 LAB — IFE INTERPRETATION

## 2017-01-20 DIAGNOSIS — F17210 Nicotine dependence, cigarettes, uncomplicated: Secondary | ICD-10-CM | POA: Diagnosis not present

## 2017-01-20 DIAGNOSIS — G4733 Obstructive sleep apnea (adult) (pediatric): Secondary | ICD-10-CM | POA: Diagnosis not present

## 2017-01-23 ENCOUNTER — Telehealth: Payer: Self-pay | Admitting: *Deleted

## 2017-01-23 DIAGNOSIS — I739 Peripheral vascular disease, unspecified: Secondary | ICD-10-CM

## 2017-01-23 DIAGNOSIS — Z79891 Long term (current) use of opiate analgesic: Secondary | ICD-10-CM | POA: Diagnosis not present

## 2017-01-23 DIAGNOSIS — G894 Chronic pain syndrome: Secondary | ICD-10-CM | POA: Diagnosis not present

## 2017-01-23 DIAGNOSIS — M47812 Spondylosis without myelopathy or radiculopathy, cervical region: Secondary | ICD-10-CM | POA: Diagnosis not present

## 2017-01-23 DIAGNOSIS — M150 Primary generalized (osteo)arthritis: Secondary | ICD-10-CM | POA: Diagnosis not present

## 2017-01-24 DIAGNOSIS — I10 Essential (primary) hypertension: Secondary | ICD-10-CM | POA: Diagnosis not present

## 2017-01-24 DIAGNOSIS — N951 Menopausal and female climacteric states: Secondary | ICD-10-CM | POA: Diagnosis not present

## 2017-01-29 NOTE — Telephone Encounter (Signed)
Called patient to schedule appointment for TRANSCEND follow up 6 month. Will see patient on Thursday, along with ABI's appointment.

## 2017-01-30 ENCOUNTER — Ambulatory Visit (HOSPITAL_COMMUNITY)
Admission: RE | Admit: 2017-01-30 | Discharge: 2017-01-30 | Disposition: A | Discharge status: Home / Self Care | Payer: Medicare Other | Source: Ambulatory Visit | Attending: Surgery | Admitting: Surgery

## 2017-01-30 ENCOUNTER — Encounter: Payer: Medicare Other | Admitting: *Deleted

## 2017-01-30 VITALS — BP 134/57 | HR 71 | Temp 98.1°F | Resp 16 | Ht 60.0 in | Wt 195.0 lb

## 2017-01-30 DIAGNOSIS — Z006 Encounter for examination for normal comparison and control in clinical research program: Secondary | ICD-10-CM

## 2017-01-30 DIAGNOSIS — I739 Peripheral vascular disease, unspecified: Secondary | ICD-10-CM | POA: Diagnosis not present

## 2017-01-30 NOTE — Progress Notes (Signed)
ABI's have been completed. Right 0.82 Left 0.91  01/30/17 4:20 PM [name] RVT

## 2017-01-30 NOTE — Progress Notes (Addendum)
[place] Class:  1 PARC classification: mild  ABI's: see Epic CV procedure ABI's have been completed. Right 0.82 Left 0.91 01/30/17 4:20 PM [name] RVT     Pt doing well, no complaints of chest pain, shortness of breath. Pt had colonoscopy and endoscopy last year only a few polyps, no signs of bleeding.   Medications reviewed, and updated.  Physical Exam: Date:  17-Jan-19 Time: 0320  Height: 5 ft Weight: 195 lbs  BP: 134/57      Right Leg Assessment:  ? YES   ? NO  Any abnormalities observed:  ? YES   ? NO   IF YES Clarify: Click or tap here to enter text.  Assessment:  Collected? Value  FEMORAL PULSE NO Choose an item.  POPITEAL PULSE YES +2  DORSALIS PEDIS PULSE YES +2  POSTERIOR TIBIAL PULSE YES +2      LEFT LEG ASSESSMENT:  ? YES    ? NO  Any abnormalities observed:  ? YES   ? NO   IF YES Clarify: Click or tap here to enter text.      LEFT Leg Assessment:  ? YES   ? NO  Any abnormalities observed:  ? YES   ? NO   IF YES Clarify: Click or tap here to enter text.  Assessment:  Collected? Value  FEMORAL PULSE Choose an item. Choose an item.  POPITEAL PULSE Choose an item. Choose an item.  DORSALIS PEDIS PULSE Choose an item. Choose an item.  POSTERIOR TIBIAL PULSE Choose an item. Choose an item.      Access Site Exam:  Was the access site examined: ? YES   ? NO  Were any abnormalities observed: ? YES   ? NO

## 2017-02-06 DIAGNOSIS — M19042 Primary osteoarthritis, left hand: Secondary | ICD-10-CM

## 2017-02-06 DIAGNOSIS — M5030 Other cervical disc degeneration, unspecified cervical region: Secondary | ICD-10-CM | POA: Insufficient documentation

## 2017-02-06 DIAGNOSIS — M5136 Other intervertebral disc degeneration, lumbar region: Secondary | ICD-10-CM | POA: Insufficient documentation

## 2017-02-06 DIAGNOSIS — R768 Other specified abnormal immunological findings in serum: Secondary | ICD-10-CM | POA: Insufficient documentation

## 2017-02-06 DIAGNOSIS — Z96642 Presence of left artificial hip joint: Secondary | ICD-10-CM | POA: Insufficient documentation

## 2017-02-06 DIAGNOSIS — I7300 Raynaud's syndrome without gangrene: Secondary | ICD-10-CM | POA: Insufficient documentation

## 2017-02-06 DIAGNOSIS — M19041 Primary osteoarthritis, right hand: Secondary | ICD-10-CM | POA: Insufficient documentation

## 2017-02-06 NOTE — Progress Notes (Signed)
Office Visit Note  Patient: [name]             Date of Birth: 1950-05-12           MRN: [id number]             PCP: Patient, No Pcp Per Referring: No ref. provider found Visit Date: 02/11/2017 Occupation: _0 @    Subjective:  [name]    History of Present Illness: [name] is a 67 y.o. female with history of positive ANA and Raynaud's phenomenon. She states the Raynaud's persists especially in her right second finger. She continues to have some stiffness and pain in her hands due to underlying osteoarthritis. Neck and lower back pain persists.she continues to have pain in her left total hip replacement and right total knee replacement. She developed a GI virus about 2 weeks ago. She still have some residual diarrhea from that.  Activities of Daily Living:  Patient reports morning stiffness for 2 hours.   Patient Reports nocturnal pain.  Difficulty dressing/grooming: Denies Difficulty climbing stairs: Reports Difficulty getting out of chair: Reports Difficulty using hands for taps, buttons, cutlery, and/or writing: Reports   Review of Systems  Constitutional: Positive for fatigue. Negative for night sweats, weight gain, weight loss and weakness.  HENT: Positive for mouth dryness. Negative for mouth sores, trouble swallowing, trouble swallowing and nose dryness.   Eyes: Negative for pain, redness, visual disturbance and dryness.  Respiratory: Negative for cough, hemoptysis, shortness of breath and difficulty breathing.   Cardiovascular: Positive for swelling in legs/feet. Negative for chest pain, palpitations, hypertension and irregular heartbeat.  Gastrointestinal: Positive for diarrhea. Negative for blood in stool and constipation.  Endocrine: Negative for increased urination.  Genitourinary: Negative for painful urination and vaginal dryness.  Musculoskeletal: Positive for arthralgias, joint pain and morning stiffness. Negative for joint swelling,  myalgias, muscle weakness, muscle tenderness and myalgias.  Skin: Positive for color change. Negative for pallor, rash, hair loss, nodules/bumps, redness, skin tightness, ulcers and sensitivity to sunlight.  Allergic/Immunologic: Negative for susceptible to infections.  Neurological: Negative for dizziness, numbness, headaches, memory loss and night sweats.  Hematological: Negative for swollen glands.  Psychiatric/Behavioral: Positive for depressed mood (on Wellbutrin) and sleep disturbance. The patient is not nervous/anxious.     PMFS History:  Patient Active Problem List   Diagnosis Date Noted  . Positive ANA (antinuclear antibody) 02/06/2017  . Raynaud's phenomenon without gangrene 02/06/2017  . Primary osteoarthritis of both hands 02/06/2017  . DDD (degenerative disc disease), cervical 02/06/2017  . DDD (degenerative disc disease), lumbar 02/06/2017  . History of left hip replacement 02/06/2017  . Neck pain on left side 08/05/2016  . Claudication in peripheral vascular disease ([place]) 07/14/2016  . H/O total knee replacement, right 01/12/2016  . Pain in joint, lower leg 05/09/2014  . Convulsion ([place]) 01/17/2014  . Left-sided weakness 01/17/2014  . 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 Left hand weakness 08/24/2012  . CERVICAL CANCER 04/03/2007  . COLONIC POLYPS 04/03/2007  . OVERWEIGHT 04/03/2007  . ANXIETY DEPRESSION 04/03/2007  . TOBACCO ABUSE 04/03/2007  . Essential hypertension 04/03/2007  . INTERNAL HEMORRHOIDS 04/03/2007  . GERD 04/03/2007  . Gastroparesis 04/03/2007  . Diverticulosis of colon 04/03/2007  . BACK PAIN, CHRONIC 04/03/2007  . CHEST PAIN, ATYPICAL 04/03/2007    Past Medical History:  Diagnosis Date  . Arthritis   . Cancer (HCC)    cervical  . COPD (chronic obstructive pulmonary disease) (HCC)    wheezing  .  GERD (gastroesophageal reflux disease)   . Hypertension   . Liver disease   . Pre-diabetes   . Seizures ([place])    no meds for 8 months    . Sleep apnea    does not use her cpap  . Stroke [facility]) 2015   no weakness  . 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 Hyperlipidemia Mother   . Hypertension Mother   . Hyperlipidemia Father   . Hypertension Father   . Alzheimer's disease Father   . Stroke Brother   . Cancer Daughter        unknown  . Colon cancer Neg Hx   . Esophageal cancer Neg Hx   . Rectal cancer Neg Hx   . Stomach cancer Neg Hx    Past Surgical History:  Procedure Laterality Date  . BACK SURGERY    . CESAREAN SECTION    . CHOLECYSTECTOMY    . HAND SURGERY Bilateral    carpal tunnel  . HEMORRHOID SURGERY    . KNEE SURGERY Left    arthroscopy x3  . LOWER EXTREMITY ANGIOGRAPHY N/A 07/16/2016   Procedure: Lower Extremity Angiography;  Surgeon: Adrian Prows, MD;  Location: [place] [facility];  Service: Cardiovascular;  Laterality: N/A;  . LOWER EXTREMITY INTERVENTION N/A 08/06/2016   Procedure: Lower Extremity Intervention;  Surgeon: Adrian Prows, MD;  Location: [place] [facility];  Service: Cardiovascular;  Laterality: N/A;  . NECK SURGERY    . OTHER SURGICAL HISTORY  2013   bladder stimulator in back  . PERIPHERAL VASCULAR BALLOON ANGIOPLASTY  08/06/2016   Procedure: Peripheral Vascular Balloon Angioplasty;  Surgeon: Adrian Prows, MD;  Location: [place] [facility];  Service: Cardiovascular;;  Right SFA  . TENNIS ELBOW RELEASE/NIRSCHEL PROCEDURE Right 12/2015  . TOE SURGERY Right    right foot second and fourth toe  . TOTAL HIP ARTHROPLASTY Left   . TOTAL KNEE ARTHROPLASTY Right 01/12/2016   Procedure: RIGHT TOTAL KNEE ARTHROPLASTY;  Surgeon: Netta Cedars, MD;  Location: [place];  Service: Orthopedics;  Laterality: Right;  . TUBAL LIGATION    . VAGINAL HYSTERECTOMY     Social History   Social History Narrative  . Not on file     Objective: Vital Signs: BP (!) 141/68 (BP Location: Left Arm, Patient Position: Sitting, Cuff Size: Normal)   Pulse 98   Resp 17    Ht 5' (1.524 m)   Wt 195 lb (88.5 kg)   BMI 38.08 kg/m    Physical Exam  Constitutional: She is oriented to person, place, and time. She appears well-developed and well-nourished.  HENT:  Head: Normocephalic and atraumatic.  Eyes: Conjunctivae and EOM are normal.  Neck: Normal range of motion.  Cardiovascular: Normal rate, regular rhythm, normal heart sounds and intact distal pulses.  Pulmonary/Chest: Effort normal and breath sounds normal.  Abdominal: Soft. Bowel sounds are normal.  Lymphadenopathy:    She has no cervical adenopathy.  Neurological: She is alert and oriented to person, place, and time.  Skin: Skin is warm and dry. Capillary refill takes less than 2 seconds.  Psychiatric: She has a normal mood and affect. Her behavior is normal.  Nursing note and vitals reviewed.    Musculoskeletal Exam: C-spine and thoracic lumbar spine limited range of motion. Shoulder joints elbow joints wrist joints are good range of motion. She has DIP PIP thickening in her hands consistent with osteoarthritis, no synovitis was  noted.she is painful limited range of motion of her hip jointsand discomfort range of motion of her right total knee replacement left knee joint with crepitus but no warmth swelling or effusion was noted.  CDAI Exam: No CDAI exam completed.    Investigation: No additional findings. CBC Latest Ref Rng & Units 01/09/2017 08/22/2016 01/15/2016  WBC 3.8 - 10.8 Thousand/uL 5.7 5.5 6.3  Hemoglobin 11.7 - 15.5 g/dL 10.3(L) 9.6(L) 10.9(L)  Hematocrit 35.0 - 45.0 % 34.2(L) 30.9(L) 33.5(L)  Platelets 140 - 400 Thousand/uL 256 245.0 189  UA negative, IFE-a poorly defined area of restricted protein mobility and reactive to IgM and kappa anti-sera. ANA 1:320 nucleolar, ENA negative, C3-C4 normal, ESR 38 Imaging: No results found.  Speciality Comments: No specialty comments available.    Procedures:  No procedures performed Allergies: Bee venom; Codeine; Adhesive [tape];  Morphine and related; Sulfa antibiotics; and Sulfonamide derivatives   Assessment / Plan:     Visit Diagnoses: Positive ANA (antinuclear antibody) - ANA 1:320 nucleolar, ENA negative. She has no clinical features of autoimmune disease except for Raynaud's phenomenon on her workup has been negative so far. I will obtain some additional labs today.  Raynaud's phenomenon without gangrene - smoker. At this time I believe that her Raynaud's phenomena is associated with smoking. - Plan: Cardiolipin antibodies, IgG, IgM, IgA, Lupus Anticoagulant Eval w/Reflex, Beta-2 glycoprotein antibodies, Pan-ANCA, Cryoglobulin, Hepatitis B core antibody, IgM, Hepatitis B surface antigen, Hepatitis C antibody. I'll call her once the lab results are available.  Primary osteoarthritis of both hands: Joint protection and muscle strengthening was discussed.  DDD (degenerative disc disease), cervical: Chronic pain  DDD (degenerative disc disease), lumbar - History of discectomy. Followed up by Dr. Hardin Negus  H/O total knee replacement, right - Dr. Veverly Fells December 2017: She continues to have discomfort.  History of left hip replacement: She continues to have discomfort.  Abnormal SPEP; She has abnormal SPEP and IFE.I will refer her to hematology for evaluation.  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 Cardiolipin antibodies, IgG, IgM, IgA  . Lupus Anticoagulant Eval w/Reflex  . Beta-2 glycoprotein antibodies  . Pan-ANCA  . Cryoglobulin  . Hepatitis B core antibody, IgM  . Hepatitis B surface antigen  . Hepatitis C antibody  . Ambulatory referral to Hematology   No orders of the defined types were placed in  this encounter.   Face-to-face time spent with patient was 30 minutes. Greater than50% of time was spent in counseling and coordination of care.  Follow-Up Instructions: Return in about 6 months (around 08/11/2017) for Osteoarthritis,DDD.   Bo Merino, MD  Note - This record has been created using Editor, commissioning.  Chart creation errors have been sought, but may not always  have been located. Such creation errors do not reflect on  the standard of medical care.

## 2017-02-11 ENCOUNTER — Telehealth: Payer: Self-pay | Admitting: Rheumatology

## 2017-02-11 ENCOUNTER — Ambulatory Visit (INDEPENDENT_AMBULATORY_CARE_PROVIDER_SITE_OTHER): Payer: Medicare Other | Admitting: Rheumatology

## 2017-02-11 ENCOUNTER — Encounter: Payer: Self-pay | Admitting: Rheumatology

## 2017-02-11 VITALS — BP 141/68 | HR 98 | Resp 17 | Ht 60.0 in | Wt 195.0 lb

## 2017-02-11 DIAGNOSIS — G40109 Localization-related (focal) (partial) symptomatic epilepsy and epileptic syndromes with simple partial seizures, not intractable, without status epilepticus: Secondary | ICD-10-CM

## 2017-02-11 DIAGNOSIS — M19042 Primary osteoarthritis, left hand: Secondary | ICD-10-CM

## 2017-02-11 DIAGNOSIS — F341 Dysthymic disorder: Secondary | ICD-10-CM | POA: Diagnosis not present

## 2017-02-11 DIAGNOSIS — M19041 Primary osteoarthritis, right hand: Secondary | ICD-10-CM

## 2017-02-11 DIAGNOSIS — I739 Peripheral vascular disease, unspecified: Secondary | ICD-10-CM

## 2017-02-11 DIAGNOSIS — R778 Other specified abnormalities of plasma proteins: Secondary | ICD-10-CM

## 2017-02-11 DIAGNOSIS — Z96651 Presence of right artificial knee joint: Secondary | ICD-10-CM

## 2017-02-11 DIAGNOSIS — K5730 Diverticulosis of large intestine without perforation or abscess without bleeding: Secondary | ICD-10-CM

## 2017-02-11 DIAGNOSIS — I7300 Raynaud's syndrome without gangrene: Secondary | ICD-10-CM | POA: Diagnosis not present

## 2017-02-11 DIAGNOSIS — K219 Gastro-esophageal reflux disease without esophagitis: Secondary | ICD-10-CM

## 2017-02-11 DIAGNOSIS — R768 Other specified abnormal immunological findings in serum: Secondary | ICD-10-CM

## 2017-02-11 DIAGNOSIS — I10 Essential (primary) hypertension: Secondary | ICD-10-CM | POA: Diagnosis not present

## 2017-02-11 DIAGNOSIS — M5136 Other intervertebral disc degeneration, lumbar region: Secondary | ICD-10-CM | POA: Diagnosis not present

## 2017-02-11 DIAGNOSIS — M51369 Other intervertebral disc degeneration, lumbar region without mention of lumbar back pain or lower extremity pain: Secondary | ICD-10-CM

## 2017-02-11 DIAGNOSIS — G40209 Localization-related (focal) (partial) symptomatic epilepsy and epileptic syndromes with complex partial seizures, not intractable, without status epilepticus: Secondary | ICD-10-CM

## 2017-02-11 DIAGNOSIS — Z96642 Presence of left artificial hip joint: Secondary | ICD-10-CM | POA: Diagnosis not present

## 2017-02-11 DIAGNOSIS — F17200 Nicotine dependence, unspecified, uncomplicated: Secondary | ICD-10-CM

## 2017-02-11 DIAGNOSIS — M5030 Other cervical disc degeneration, unspecified cervical region: Secondary | ICD-10-CM

## 2017-02-11 DIAGNOSIS — R7689 Other specified abnormal immunological findings in serum: Secondary | ICD-10-CM

## 2017-02-11 NOTE — Telephone Encounter (Signed)
Patient would like to have all lab results, old and new, sent to Dr. Nicholaus Bloom (pain management dr). Dr. Hardin Negus fax# [phone] Patients rov appt 2/12.

## 2017-02-12 NOTE — Telephone Encounter (Signed)
IC, told patient we need signed release form before we can send records out for her. She said she will come by and sign form Friday.

## 2017-02-17 ENCOUNTER — Telehealth: Payer: Self-pay | Admitting: Oncology

## 2017-02-17 ENCOUNTER — Ambulatory Visit: Payer: Medicare PPO | Admitting: Rheumatology

## 2017-02-17 LAB — HEPATITIS C ANTIBODY
Hepatitis C Ab: NONREACTIVE
SIGNAL TO CUT-OFF: 0.02 (ref <1.00)

## 2017-02-17 LAB — PAN-ANCA: ANCA Screen: POSITIVE — AB

## 2017-02-17 LAB — CRYOGLOBULIN: Cryoglobulin, Qualitative Analysis: NOT DETECTED

## 2017-02-17 LAB — BETA-2 GLYCOPROTEIN ANTIBODIES
Beta-2 Glyco 1 IgA: <9 SAU (ref <=20)
Beta-2 Glyco 1 IgM: <9 SMU (ref <=20)
Beta-2 Glyco I IgG: 18 SGU (ref <=20)

## 2017-02-17 LAB — CARDIOLIPIN ANTIBODIES, IGG, IGM, IGA
Anticardiolipin IgA: <11 [APL'U]
Anticardiolipin IgG: <14 [GPL'U]
Anticardiolipin IgM: <12 [MPL'U]

## 2017-02-17 LAB — LUPUS ANTICOAGULANT EVAL W/ REFLEX
PTT LA SCREEN: 44 s — ABNORMAL HIGH (ref <=40)
dRVVT Screen: 47 s — ABNORMAL HIGH (ref <=45)

## 2017-02-17 LAB — HEPATITIS B CORE ANTIBODY, IGM: Hep B C IgM: NONREACTIVE

## 2017-02-17 LAB — THROMBIN CLOTTING TIME: Thrombin Clotting Time: 15 s (ref 13–19)

## 2017-02-17 LAB — RFLX DRVVT CONFRIM: dRVVT Confirm: NEGATIVE

## 2017-02-17 LAB — RFLX HEXAGONAL PHASE CONFIRM: Hexagonal Phase Confirm: POSITIVE — AB

## 2017-02-17 LAB — HEPATITIS B SURFACE ANTIGEN: HEP B S AG: NONREACTIVE

## 2017-02-17 NOTE — Telephone Encounter (Signed)
Appt has been scheduled for the pt to see Dr. Alen Blew on 2/7 at 11:15am. Pt aware to arrive 30 minutes early.

## 2017-02-20 ENCOUNTER — Telehealth: Payer: Self-pay | Admitting: Oncology

## 2017-02-20 ENCOUNTER — Inpatient Hospital Stay: Payer: Medicare Other | Attending: Oncology | Admitting: Oncology

## 2017-02-20 VITALS — BP 112/79 | HR 101 | Temp 98.5°F | Resp 18 | Ht 60.0 in | Wt 197.9 lb

## 2017-02-20 DIAGNOSIS — Z720 Tobacco use: Secondary | ICD-10-CM

## 2017-02-20 DIAGNOSIS — R779 Abnormality of plasma protein, unspecified: Secondary | ICD-10-CM | POA: Diagnosis not present

## 2017-02-20 DIAGNOSIS — Z809 Family history of malignant neoplasm, unspecified: Secondary | ICD-10-CM | POA: Insufficient documentation

## 2017-02-20 DIAGNOSIS — D509 Iron deficiency anemia, unspecified: Secondary | ICD-10-CM | POA: Diagnosis not present

## 2017-02-20 DIAGNOSIS — D472 Monoclonal gammopathy: Secondary | ICD-10-CM

## 2017-02-20 MED ORDER — FERROUS SULFATE 325 (65 FE) MG PO TABS: 325.0000 mg | ORAL_TABLET | Freq: Every day | ORAL | 3 refills | Status: DC

## 2017-02-20 NOTE — Telephone Encounter (Signed)
Gave patient avs and calendar with appts per 2/7 los.  °

## 2017-02-20 NOTE — Progress Notes (Signed)
Reason for Referral: Evaluation for plasma cell disorder.  HPI: 67 year old woman native of [place] where she has lived the majority of her life.  She has history of hypertension, COPD as well as ray nods phenomenon.  He has also history of a positive ANA and was evaluated by Dr. Estanislado Pandy in January 2019.  At that time serum protein electrophoresis was obtained which showed nonspecific findings that is less likely a monoclonal spike.  At that time her hemoglobin was noted to be 10.3 with an MCV of 69.2.  Her white cell count was normal with a normal platelet count.  Her MCV in August 2018 was 77.6 with a hemoglobin of 9.6.  Iron studies in August 2018 showed iron level of 30 with ferritin of 39 and saturation of 5.5%.  She had a GI workup including colonoscopy and endoscopy without any active source of bleeding.  She feels reasonably well except for mild fatigue and cold sensitivity.  She does report ice cravings.  She has been on iron in the past while she was pregnant.  She denies any pathological fractures, peripheral neuropathy or recurrent infections.   She does not report any headaches, blurry vision, syncope or seizures. Does not report any fevers, chills or sweats.  Does not report any cough, wheezing or hemoptysis.  Does not report any chest pain, palpitation, orthopnea or leg edema.  Does not report any nausea, vomiting or abdominal pain.  She does not report any constipation or diarrhea.  Does not report any skeletal complaints.    Does not report frequency, urgency or hematuria.  Does not report any skin rashes or lesions. Does not report any lymphadenopathy or petechiae.  Does not report any anxiety or depression.  Remaining review of systems is negative.    Past Medical History:  Diagnosis Date  . Arthritis   . Cancer (HCC)    cervical  . COPD (chronic obstructive pulmonary disease) (HCC)    wheezing  . GERD (gastroesophageal reflux disease)   . Hypertension   . Liver disease    . Pre-diabetes   . Seizures ([place])    no meds for 8 months  . Sleep apnea    does not use her cpap  . Stroke [facility]) 2015   no weakness  . 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   rt  :  Past Surgical History:  Procedure Laterality Date  . BACK SURGERY    . CESAREAN SECTION    . CHOLECYSTECTOMY    . HAND SURGERY Bilateral    carpal tunnel  . HEMORRHOID SURGERY    . KNEE SURGERY Left    arthroscopy x3  . LOWER EXTREMITY ANGIOGRAPHY N/A 07/16/2016   Procedure: Lower Extremity Angiography;  Surgeon: Adrian Prows, MD;  Location: [place] [facility];  Service: Cardiovascular;  Laterality: N/A;  . LOWER EXTREMITY INTERVENTION N/A 08/06/2016   Procedure: Lower Extremity Intervention;  Surgeon: Adrian Prows, MD;  Location: [place] [facility];  Service: Cardiovascular;  Laterality: N/A;  . NECK SURGERY    . OTHER SURGICAL HISTORY  2013   bladder stimulator in back  . PERIPHERAL VASCULAR BALLOON ANGIOPLASTY  08/06/2016   Procedure: Peripheral Vascular Balloon Angioplasty;  Surgeon: Adrian Prows, MD;  Location: [place] [facility];  Service: Cardiovascular;;  Right SFA  . TENNIS ELBOW RELEASE/NIRSCHEL PROCEDURE Right 12/2015  . TOE SURGERY Right    right foot second and fourth toe  . TOTAL HIP ARTHROPLASTY Left   . TOTAL KNEE ARTHROPLASTY Right 01/12/2016  Procedure: RIGHT TOTAL KNEE ARTHROPLASTY;  Surgeon: Netta Cedars, MD;  Location: [place];  Service: Orthopedics;  Laterality: Right;  . TUBAL LIGATION    . VAGINAL HYSTERECTOMY    :   Current Outpatient Medications:  .  albuterol (PROVENTIL HFA;VENTOLIN HFA) 108 (90 BASE) MCG/ACT inhaler, Inhale 2 puffs into the lungs every 4 (four) hours as needed for wheezing or shortness of breath (cough)., Disp: 1 Inhaler, Rfl: 0 .  aspirin 81 MG chewable tablet, Chew 81 mg by mouth daily., Disp: , Rfl:  .  aspirin-sod bicarb-citric acid (ALKA-SELTZER) 325 MG TBEF tablet, Take 650 mg by mouth 3 (three) times daily after meals., Disp: , Rfl:  .  buPROPion (WELLBUTRIN  XL) 150 MG 24 hr tablet, Take 150-300 mg by mouth See admin instructions. TAKE 1 TABLET BY MOUTH IN THE MORNING, Disp: , Rfl: 2 .  buPROPion (ZYBAN) 150 MG 12 hr tablet, TAKE 1 TABLET BY MOUTH ONCE DAILY FOR 3 DAYS, THEN 1 TABLET IN THE MORNING AND 1 TABLET AT 4PM, Disp: , Rfl: 2 .  calcium carbonate (TUMS EX) 750 MG chewable tablet, Chew 2-4 tablets by mouth as needed for heartburn., Disp: , Rfl:  .  Cholecalciferol (VITAMIN D-3) 1000 units CAPS, Take by mouth daily., Disp: , Rfl:  .  clopidogrel (PLAVIX) 75 MG tablet, Take 75 mg by mouth daily after breakfast., Disp: , Rfl: 3 .  docusate sodium (COLACE) 100 MG capsule, Take 200 mg by mouth daily after breakfast., Disp: , Rfl:  .  DULoxetine (CYMBALTA) 60 MG capsule, Take 60 mg by mouth daily after breakfast. , Disp: , Rfl:  .  hydrochlorothiazide (HYDRODIURIL) 12.5 MG tablet, Take 12.5 mg by mouth daily after breakfast., Disp: , Rfl: 0 .  lansoprazole (PREVACID 24HR) 15 MG capsule, Take 15 mg by mouth daily as needed (for acid reflux)., Disp: , Rfl:  .  losartan-hydrochlorothiazide (HYZAAR) 100-25 MG tablet, Take 1 tablet by mouth daily., Disp: , Rfl:  .  methocarbamol (ROBAXIN) 500 MG tablet, Take 1 tablet (500 mg total) by mouth 3 (three) times daily as needed., Disp: 60 tablet, Rfl: 1 .  OVER THE COUNTER MEDICATION, Apply 1 application topically as needed (joint pain). Magnilife topicall muscle rub, Disp: , Rfl:  .  OVER THE COUNTER MEDICATION, Apply 1 application topically daily as needed (leg pain). Horse ligament otc muscle rub, Disp: , Rfl:  .  rOPINIRole (REQUIP) 1 MG tablet, Take 1 mg by mouth 4 (four) times daily. , Disp: , Rfl:  .  Tolnaftate (ABSORBINE JR EX), Apply 1 application topically as needed (knee pain)., Disp: , Rfl:  .  trolamine salicylate (ASPERCREME) 10 % cream, Apply 1 application topically as needed (hand and neck pain)., Disp: , Rfl:  .  ferrous sulfate 325 (65 FE) MG tablet, Take 1 tablet (325 mg total) by mouth daily  with breakfast., Disp: 90 tablet, Rfl: 3 .  nitroGLYCERIN (NITROSTAT) 0.4 MG SL tablet, Place 0.4 mg under the tongue every 5 (five) minutes x 3 doses as needed. For chest pain., Disp: , Rfl: 1 .  oxyCODONE-acetaminophen (PERCOCET) 7.5-325 MG tablet, Take 1-2 tablets by mouth every 4 (four) hours as needed for severe pain. (Patient not taking: Reported on 02/11/2017), Disp: 60 tablet, Rfl: 0:  Allergies  Allergen Reactions  . Bee Venom Anaphylaxis  . Morphine And Related Other (See Comments)    Overly sedated  . Codeine Hives  . Adhesive [Tape] Other (See Comments)    Redness, Paper tape  is ok  . Sulfa Antibiotics Other (See Comments)    Unspecified reaction from childhood  . Sulfonamide Derivatives Other (See Comments)    UNSPECIFIED REACTION OF CHILDHOOD   :  Family History  Problem Relation Age of Onset  . Hyperlipidemia Mother   . Hypertension Mother   . Hyperlipidemia Father   . Hypertension Father   . Alzheimer's disease Father   . Stroke Brother   . Cancer Daughter        unknown  . Colon cancer Neg Hx   . Esophageal cancer Neg Hx   . Rectal cancer Neg Hx   . Stomach cancer Neg Hx   :  Social History   Socioeconomic History  . Marital status: Married    Spouse name: Not on file  . Number of children: Not on file  . Years of education: Not on file  . Highest education level: Not on file  Social Needs  . Financial resource strain: Not on file  . Food insecurity - worry: Not on file  . Food insecurity - inability: Not on file  . Transportation needs - medical: Not on file  . Transportation needs - non-medical: Not on file  Occupational History  . Not on file  Tobacco Use  . Smoking status: Current Every Day Smoker    Packs/day: 1.00    Years: 53.00    Pack years: 53.00  . Smokeless tobacco: Never Used  . Tobacco comment: DOWN TO 1 PACK DAY  Substance and Sexual Activity  . Alcohol use: No    Alcohol/week: 0.0 oz  . Drug use: No  . Sexual activity: No   Other Topics Concern  . Not on file  Social History Narrative  . Not on file  :  Pertinent items are noted in HPI.  Exam: Blood pressure 112/79, pulse (!) 101, temperature 98.5 F (36.9 C), temperature source Oral, resp. rate 18, height 5' (1.524 m), weight 197 lb 14.4 oz (89.8 kg), SpO2 100 %.  ECOG 0 General appearance: alert and cooperative appeared without distress. Throat: No oral thrush or ulcers.  Mucosa is moist and pink. Resp: clear to auscultation bilaterally without rhonchi or wheezes or dullness to percussion. Cardio: regular rate and rhythm, S1, S2 normal, no murmur, click, rub or gallop GI: soft, non-tender; bowel sounds normal; no masses,  no organomegaly Skin: Skin color, texture, turgor normal. No rashes or lesions Lymph nodes: Cervical, supraclavicular, and axillary nodes normal. Neurologic: Grossly normal without any motor or sensory deficits. Psychiatric: Appropriate mood and affect.  CBC    Component Value Date/Time   WBC 5.7 01/09/2017 1102   RBC 4.94 01/09/2017 1102   HGB 10.3 (L) 01/09/2017 1102   HCT 34.2 (L) 01/09/2017 1102   PLT 256 01/09/2017 1102   MCV 69.2 (L) 01/09/2017 1102   MCH 20.9 (L) 01/09/2017 1102   MCHC 30.1 (L) 01/09/2017 1102   RDW 18.1 (H) 01/09/2017 1102   LYMPHSABS 1,340 01/09/2017 1102   MONOABS 0.5 08/22/2016 1451   EOSABS 234 01/09/2017 1102   BASOSABS 29 01/09/2017 1102     Chemistry      Component Value Date/Time   NA 136 08/06/2016 0612   K 3.4 (L) 08/06/2016 0612   K 3.5 03/19/2011 0722   CL 101 08/06/2016 0612   CO2 29 08/06/2016 0612   BUN 12 08/06/2016 0612   CREATININE 0.80 08/06/2016 0612      Component Value Date/Time   CALCIUM 8.7 (L) 08/06/2016 1540  ALKPHOS 112 08/06/2016 0612   AST 17 08/06/2016 0612   ALT 22 08/06/2016 0612   BILITOT 0.3 08/06/2016 0612         Assessment and Plan:   67 year old woman with the following issues:  1.  Abnormal serum protein electrophoresis: Her SPEP  obtained on January 09, 2017 showed a poorly defined band of restriction mobility in the gammaglobulin region although it is unlikely to be a monoclonal protein.  Immunofixation was not performed at that time.  The differential diagnosis was reviewed today with the patient and her husband.  These findings represent a reactive process to a possible autoimmune disease rather than a plasma cell disorder.  I see no evidence to suggest a multiple myeloma and less likely we are dealing with MGUS.  For completeness, I will obtain a repeat serum protein electrophoresis, quantitative immunoglobulins, immunofixation with serum light chains to complete this evaluation.  If these testing do not suggest a monoclonal protein, no further evaluation is needed.  2.  Microcytic anemia: She does have documented iron deficiency dating back to August 2018.  Her hemoglobin in December was 10.3 with an MCV of 69 and an RDW of 18.1.  She does not have any active bleeding and her GI workup was unrevealing.  It is possible that she has had chronic blood loss in the past related to orthopedic surgery and poor nutritional intake.  Poor oral iron absorption could also be a possibility.  She has not been on oral iron since her pregnancy.  I recommended proceeding with oral iron replacement first and recheck in her iron in 3 months.  Intravenous iron can be used if needed to to correct her iron deficiency if oral iron does not correct her because of poor absorption.  3.  Follow-up: We will be in 3 months to follow her iron studies.  45  minutes was spent with the patient face-to-face today.  More than 50% of time was dedicated to patient counseling, education and coordination of her multifaceted care.

## 2017-02-21 DIAGNOSIS — H25813 Combined forms of age-related cataract, bilateral: Secondary | ICD-10-CM | POA: Diagnosis not present

## 2017-02-25 DIAGNOSIS — G894 Chronic pain syndrome: Secondary | ICD-10-CM | POA: Diagnosis not present

## 2017-02-25 DIAGNOSIS — Z79891 Long term (current) use of opiate analgesic: Secondary | ICD-10-CM | POA: Diagnosis not present

## 2017-02-25 DIAGNOSIS — M47812 Spondylosis without myelopathy or radiculopathy, cervical region: Secondary | ICD-10-CM | POA: Diagnosis not present

## 2017-02-25 DIAGNOSIS — M150 Primary generalized (osteo)arthritis: Secondary | ICD-10-CM | POA: Diagnosis not present

## 2017-02-27 DIAGNOSIS — I10 Essential (primary) hypertension: Secondary | ICD-10-CM | POA: Diagnosis not present

## 2017-02-27 DIAGNOSIS — F17210 Nicotine dependence, cigarettes, uncomplicated: Secondary | ICD-10-CM | POA: Diagnosis not present

## 2017-02-27 DIAGNOSIS — E789 Disorder of lipoprotein metabolism, unspecified: Secondary | ICD-10-CM | POA: Diagnosis not present

## 2017-02-27 DIAGNOSIS — K5903 Drug induced constipation: Secondary | ICD-10-CM | POA: Diagnosis not present

## 2017-02-27 DIAGNOSIS — G4733 Obstructive sleep apnea (adult) (pediatric): Secondary | ICD-10-CM | POA: Diagnosis not present

## 2017-03-18 ENCOUNTER — Ambulatory Visit: Payer: Medicare PPO | Admitting: Rheumatology

## 2017-03-26 DIAGNOSIS — M150 Primary generalized (osteo)arthritis: Secondary | ICD-10-CM | POA: Diagnosis not present

## 2017-03-26 DIAGNOSIS — G894 Chronic pain syndrome: Secondary | ICD-10-CM | POA: Diagnosis not present

## 2017-03-26 DIAGNOSIS — Z79891 Long term (current) use of opiate analgesic: Secondary | ICD-10-CM | POA: Diagnosis not present

## 2017-03-26 DIAGNOSIS — M47812 Spondylosis without myelopathy or radiculopathy, cervical region: Secondary | ICD-10-CM | POA: Diagnosis not present

## 2017-04-02 DIAGNOSIS — H2512 Age-related nuclear cataract, left eye: Secondary | ICD-10-CM | POA: Diagnosis not present

## 2017-04-23 DIAGNOSIS — H2511 Age-related nuclear cataract, right eye: Secondary | ICD-10-CM | POA: Diagnosis not present

## 2017-04-23 DIAGNOSIS — H25811 Combined forms of age-related cataract, right eye: Secondary | ICD-10-CM | POA: Diagnosis not present

## 2017-04-25 DIAGNOSIS — M150 Primary generalized (osteo)arthritis: Secondary | ICD-10-CM | POA: Diagnosis not present

## 2017-04-25 DIAGNOSIS — M47812 Spondylosis without myelopathy or radiculopathy, cervical region: Secondary | ICD-10-CM | POA: Diagnosis not present

## 2017-04-25 DIAGNOSIS — G894 Chronic pain syndrome: Secondary | ICD-10-CM | POA: Diagnosis not present

## 2017-04-25 DIAGNOSIS — Z79891 Long term (current) use of opiate analgesic: Secondary | ICD-10-CM | POA: Diagnosis not present

## 2017-05-23 DIAGNOSIS — G894 Chronic pain syndrome: Secondary | ICD-10-CM | POA: Diagnosis not present

## 2017-05-23 DIAGNOSIS — M150 Primary generalized (osteo)arthritis: Secondary | ICD-10-CM | POA: Diagnosis not present

## 2017-05-23 DIAGNOSIS — M47812 Spondylosis without myelopathy or radiculopathy, cervical region: Secondary | ICD-10-CM | POA: Diagnosis not present

## 2017-05-23 DIAGNOSIS — Z79891 Long term (current) use of opiate analgesic: Secondary | ICD-10-CM | POA: Diagnosis not present

## 2017-05-26 DIAGNOSIS — I10 Essential (primary) hypertension: Secondary | ICD-10-CM | POA: Diagnosis not present

## 2017-05-26 DIAGNOSIS — I701 Atherosclerosis of renal artery: Secondary | ICD-10-CM | POA: Diagnosis not present

## 2017-05-26 DIAGNOSIS — R0789 Other chest pain: Secondary | ICD-10-CM | POA: Diagnosis not present

## 2017-05-26 DIAGNOSIS — I739 Peripheral vascular disease, unspecified: Secondary | ICD-10-CM | POA: Diagnosis not present

## 2017-05-27 ENCOUNTER — Inpatient Hospital Stay: Payer: Medicare Other

## 2017-05-27 ENCOUNTER — Telehealth: Payer: Self-pay | Admitting: Oncology

## 2017-05-27 ENCOUNTER — Inpatient Hospital Stay: Payer: Medicare Other | Attending: Oncology | Admitting: Oncology

## 2017-05-27 VITALS — BP 176/93 | HR 91 | Temp 98.3°F | Resp 18 | Ht 60.0 in | Wt 204.1 lb

## 2017-05-27 DIAGNOSIS — D472 Monoclonal gammopathy: Secondary | ICD-10-CM

## 2017-05-27 DIAGNOSIS — D509 Iron deficiency anemia, unspecified: Secondary | ICD-10-CM | POA: Insufficient documentation

## 2017-05-27 LAB — CBC WITH DIFFERENTIAL (CANCER CENTER ONLY)
Basophils Absolute: 0 10*3/uL (ref 0.0–0.1)
Basophils Relative: 0 %
EOS ABS: 0.3 10*3/uL (ref 0.0–0.5)
Eosinophils Relative: 5 %
HCT: 40.3 % (ref 34.8–46.6)
HEMOGLOBIN: 13.2 g/dL (ref 11.6–15.9)
Lymphocytes Relative: 28 %
Lymphs Abs: 1.4 10*3/uL (ref 0.9–3.3)
MCH: 28.4 pg (ref 25.1–34.0)
MCHC: 32.8 g/dL (ref 31.5–36.0)
MCV: 86.7 fL (ref 79.5–101.0)
MONO ABS: 0.4 10*3/uL (ref 0.1–0.9)
Monocytes Relative: 7 %
NEUTROS ABS: 3 10*3/uL (ref 1.5–6.5)
NEUTROS PCT: 60 %
PLATELETS: 178 10*3/uL (ref 145–400)
RBC: 4.65 MIL/uL (ref 3.70–5.45)
RDW: 16.8 % — ABNORMAL HIGH (ref 11.2–14.5)
WBC: 5.1 10*3/uL (ref 3.9–10.3)

## 2017-05-27 LAB — IRON AND TIBC
IRON: 38 ug/dL — AB (ref 41–142)
SATURATION RATIOS: 10 % — AB (ref 21–57)
TIBC: 371 ug/dL (ref 236–444)
UIBC: 334 ug/dL

## 2017-05-27 LAB — FERRITIN: FERRITIN: 72 ng/mL (ref 9–269)

## 2017-05-27 NOTE — Telephone Encounter (Signed)
Scheduled appt per 5/14 los - Gave patient AVS and calender per los.

## 2017-05-27 NOTE — Progress Notes (Signed)
Hematology and Oncology Follow Up Visit  [name] [id number] 08-Oct-1950 67 y.o. 05/27/2017 12:36 PM [name], [name] Free, NP   Principle Diagnosis: 67 year old woman with the following issues:  1.  Abnormal serum protein electrophoresis without any monoclonal gammopathy.  Immunofixation in December 2018 did not show any specific monoclonal protein.  2.  Iron deficiency anemia without any clear-cut GI blood losses.   Current therapy: Oral iron replacement.  Interim History: [name] presents today for a follow-up visit.  Since the last visit, she reports no major changes in her health.  She continues to take oral iron without any decline in ability to do so.  She denied some minor constipation and discoloration of her stool but no dyspepsia, abdominal pain or severe constipation.  She denies any excessive fatigue or tiredness.  She denies any shortness of breath or difficulty breathing.  She denies any hematochezia or melena.  She does not report any headaches, blurry vision, syncope or seizures. Does not report any fevers, chills or sweats.  Does not report any cough, wheezing or hemoptysis.  Does not report any chest pain, palpitation, orthopnea or leg edema.  Does not report any nausea, vomiting or abdominal pain.  Does not report any constipation or diarrhea.  Does not report any skeletal complaints.    Does not report frequency, urgency or hematuria.  Does not report any skin rashes or lesions. Does not report any lymphadenopathy or petechiae.  Does not report any anxiety or depression.  Remaining review of systems is negative.    Medications: I have reviewed the patient's current medications.  Current Outpatient Medications  Medication Sig Dispense Refill  . albuterol (PROVENTIL HFA;VENTOLIN HFA) 108 (90 BASE) MCG/ACT inhaler Inhale 2 puffs into the lungs every 4 (four) hours as needed for wheezing or shortness of breath (cough). 1 Inhaler 0  . aspirin 81 MG chewable  tablet Chew 81 mg by mouth daily.    Marland Kitchen aspirin-sod bicarb-citric acid (ALKA-SELTZER) 325 MG TBEF tablet Take 650 mg by mouth 3 (three) times daily after meals.    Marland Kitchen buPROPion (WELLBUTRIN XL) 150 MG 24 hr tablet Take 150-300 mg by mouth See admin instructions. TAKE 1 TABLET BY MOUTH IN THE MORNING  2  . buPROPion (ZYBAN) 150 MG 12 hr tablet TAKE 1 TABLET BY MOUTH ONCE DAILY FOR 3 DAYS, THEN 1 TABLET IN THE MORNING AND 1 TABLET AT 4PM  2  . calcium carbonate (TUMS EX) 750 MG chewable tablet Chew 2-4 tablets by mouth as needed for heartburn.    . Cholecalciferol (VITAMIN D-3) 1000 units CAPS Take by mouth daily.    . clopidogrel (PLAVIX) 75 MG tablet Take 75 mg by mouth daily after breakfast.  3  . docusate sodium (COLACE) 100 MG capsule Take 200 mg by mouth daily after breakfast.    . DULoxetine (CYMBALTA) 60 MG capsule Take 60 mg by mouth daily after breakfast.     . ferrous sulfate 325 (65 FE) MG tablet Take 1 tablet (325 mg total) by mouth daily with breakfast. 90 tablet 3  . hydrochlorothiazide (HYDRODIURIL) 12.5 MG tablet Take 12.5 mg by mouth daily after breakfast.  0  . lansoprazole (PREVACID 24HR) 15 MG capsule Take 15 mg by mouth daily as needed (for acid reflux).    Marland Kitchen losartan-hydrochlorothiazide (HYZAAR) 100-25 MG tablet Take 1 tablet by mouth daily.    . methocarbamol (ROBAXIN) 500 MG tablet Take 1 tablet (500 mg total) by mouth 3 (three) times daily as  needed. 60 tablet 1  . nitroGLYCERIN (NITROSTAT) 0.4 MG SL tablet Place 0.4 mg under the tongue every 5 (five) minutes x 3 doses as needed. For chest pain.  1  . OVER THE COUNTER MEDICATION Apply 1 application topically as needed (joint pain). Magnilife topicall muscle rub    . OVER THE COUNTER MEDICATION Apply 1 application topically daily as needed (leg pain). Horse ligament otc muscle rub    . oxyCODONE-acetaminophen (PERCOCET) 7.5-325 MG tablet Take 1-2 tablets by mouth every 4 (four) hours as needed for severe pain. (Patient not  taking: Reported on 02/11/2017) 60 tablet 0  . rOPINIRole (REQUIP) 1 MG tablet Take 1 mg by mouth 4 (four) times daily.     . Tolnaftate (ABSORBINE JR EX) Apply 1 application topically as needed (knee pain).    Marland Kitchen trolamine salicylate (ASPERCREME) 10 % cream Apply 1 application topically as needed (hand and neck pain).     No current facility-administered medications for this visit.      Allergies:  Allergies  Allergen Reactions  . Bee Venom Anaphylaxis  . Morphine And Related Other (See Comments)    Overly sedated  . Codeine Hives  . Adhesive [Tape] Other (See Comments)    Redness, Paper tape is ok  . Sulfa Antibiotics Other (See Comments)    Unspecified reaction from childhood  . Sulfonamide Derivatives Other (See Comments)    UNSPECIFIED REACTION OF CHILDHOOD     Past Medical History, Surgical history, Social history, and Family History were reviewed and updated.  Review of Systems:  Remaining ROS negative.  Physical Exam: Blood pressure (!) 176/93, pulse 91, temperature 98.3 F (36.8 C), temperature source Oral, resp. rate 18, height 5' (1.524 m), weight 204 lb 1.6 oz (92.6 kg), SpO2 97 %. ECOG:  General appearance: alert and cooperative appeared without distress. Head: Normocephalic, without obvious abnormality Oropharynx: No oral thrush or ulcers. Eyes: No scleral icterus.  Pupils are equal and round reactive to light. Lymph nodes: Cervical, supraclavicular, and axillary nodes normal. Heart:regular rate and rhythm, S1, S2 normal, no murmur, click, rub or gallop Lung:chest clear, no wheezing, rales, normal symmetric air entry Abdomin: soft, non-tender, without masses or organomegaly. Neurological: No motor, sensory deficits.  Intact deep tendon reflexes. Skin: No rashes or lesions.  No ecchymosis or petechiae. Musculoskeletal: No joint deformity or effusion. Psychiatric: Mood and affect are appropriate.    Lab Results: Lab Results  Component Value Date   WBC  5.7 01/09/2017   HGB 10.3 (L) 01/09/2017   HCT 34.2 (L) 01/09/2017   MCV 69.2 (L) 01/09/2017   PLT 256 01/09/2017     Chemistry      Component Value Date/Time   NA 136 08/06/2016 0612   K 3.4 (L) 08/06/2016 0612   K 3.5 03/19/2011 0722   CL 101 08/06/2016 0612   CO2 29 08/06/2016 0612   BUN 12 08/06/2016 0612   CREATININE 0.80 08/06/2016 0612      Component Value Date/Time   CALCIUM 8.7 (L) 08/06/2016 0612   ALKPHOS 112 08/06/2016 0612   AST 17 08/06/2016 0612   ALT 22 08/06/2016 0612   BILITOT 0.3 08/06/2016 0612        Impression and Plan:  67 year old woman with:   1.  Abnormal serum protein electrophoresis noted on January 09, 2017.  These findings are likely reactive in nature and do not reflect plasma cell disorder.  They are repeated today and currently pending.  2.  Iron deficiency anemia: She  is currently on oral iron supplements without any major complications.  Her hemoglobin and iron studies are repeated today and will be adjusted as needed.  Intravenous iron can be used in the future she has iron absorption issues.  3.  Follow-up: We will be in 6 months to follow her progress.  15  minutes was spent with the patient face-to-face today.  More than 50% of time was dedicated to patient counseling, education and answering questions regarding her diagnosis and future plan of care.    Zola Button, MD 5/14/201912:36 PM

## 2017-05-28 LAB — KAPPA/LAMBDA LIGHT CHAINS
Kappa free light chain: 23.4 mg/L — ABNORMAL HIGH (ref 3.3–19.4)
Kappa, lambda light chain ratio: 1.29 (ref 0.26–1.65)
LAMDA FREE LIGHT CHAINS: 18.2 mg/L (ref 5.7–26.3)

## 2017-06-02 DIAGNOSIS — N390 Urinary tract infection, site not specified: Secondary | ICD-10-CM | POA: Diagnosis not present

## 2017-06-02 DIAGNOSIS — R300 Dysuria: Secondary | ICD-10-CM | POA: Diagnosis not present

## 2017-06-02 DIAGNOSIS — H9202 Otalgia, left ear: Secondary | ICD-10-CM | POA: Diagnosis not present

## 2017-06-02 LAB — MULTIPLE MYELOMA PANEL, SERUM
ALBUMIN SERPL ELPH-MCNC: 3.8 g/dL (ref 2.9–4.4)
Albumin/Glob SerPl: 1.3 (ref 0.7–1.7)
Alpha 1: 0.2 g/dL (ref 0.0–0.4)
Alpha2 Glob SerPl Elph-Mcnc: 0.9 g/dL (ref 0.4–1.0)
B-Globulin SerPl Elph-Mcnc: 1.2 g/dL (ref 0.7–1.3)
Gamma Glob SerPl Elph-Mcnc: 0.8 g/dL (ref 0.4–1.8)
Globulin, Total: 3.1 g/dL (ref 2.2–3.9)
IGA: 246 mg/dL (ref 87–352)
IGM (IMMUNOGLOBULIN M), SRM: 88 mg/dL (ref 26–217)
IgG (Immunoglobin G), Serum: 954 mg/dL (ref 700–1600)
TOTAL PROTEIN ELP: 6.9 g/dL (ref 6.0–8.5)

## 2017-06-23 DIAGNOSIS — Z79891 Long term (current) use of opiate analgesic: Secondary | ICD-10-CM | POA: Diagnosis not present

## 2017-06-23 DIAGNOSIS — G894 Chronic pain syndrome: Secondary | ICD-10-CM | POA: Diagnosis not present

## 2017-06-23 DIAGNOSIS — M150 Primary generalized (osteo)arthritis: Secondary | ICD-10-CM | POA: Diagnosis not present

## 2017-06-23 DIAGNOSIS — M47812 Spondylosis without myelopathy or radiculopathy, cervical region: Secondary | ICD-10-CM | POA: Diagnosis not present

## 2017-07-02 ENCOUNTER — Other Ambulatory Visit: Payer: Self-pay | Admitting: Cardiology

## 2017-07-02 DIAGNOSIS — I872 Venous insufficiency (chronic) (peripheral): Secondary | ICD-10-CM

## 2017-07-15 ENCOUNTER — Ambulatory Visit
Admission: RE | Admit: 2017-07-15 | Discharge: 2017-07-15 | Disposition: A | Discharge status: Home / Self Care | Payer: Medicare Other | Source: Ambulatory Visit | Attending: Cardiology | Admitting: Cardiology

## 2017-07-15 DIAGNOSIS — I872 Venous insufficiency (chronic) (peripheral): Secondary | ICD-10-CM

## 2017-07-15 DIAGNOSIS — M25552 Pain in left hip: Secondary | ICD-10-CM | POA: Diagnosis not present

## 2017-07-15 DIAGNOSIS — R600 Localized edema: Secondary | ICD-10-CM | POA: Diagnosis not present

## 2017-07-15 NOTE — Consult Note (Signed)
Chief Complaint: Patient was seen in consultation today for lower extremity venous insufficiency at the request of Ganji,Jay  Referring Physician(s): Ganji,Jay  History of Present Illness: [name] is a 67 y.o. female with a history of bilateral lower extremity edema, left greater than right, primarily below the knees.  She also complains of bilateral lower extremity pain and varicose veins.  She does have restless legs bilaterally at night.  She has previously periodically worn knee-high compression stockings in the past with marginal benefit.  She has not had any prior varicose vein therapy or surgery.  She is status post left total hip arthroplasty by Dr. Telford Nab 14 years ago and right knee arthroplasty by Dr. Veverly Fells 2 years ago.  Over the last several days, she has noticed much more severe pain in the left hip and left upper leg which has affected her ability to bear weight and ambulate.  Past Medical History:  Diagnosis Date  . Arthritis   . Cancer (HCC)    cervical  . COPD (chronic obstructive pulmonary disease) (HCC)    wheezing  . GERD (gastroesophageal reflux disease)   . Hypertension   . Liver disease   . Pre-diabetes   . Seizures ([place])    no meds for 8 months  . Sleep apnea    does not use her cpap  . Stroke [facility]) 2015   no weakness  . 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 BACK SURGERY    . CESAREAN SECTION    . CHOLECYSTECTOMY    . HAND SURGERY Bilateral    carpal tunnel  . HEMORRHOID SURGERY    . KNEE SURGERY Left    arthroscopy x3  . LOWER EXTREMITY ANGIOGRAPHY N/A 07/16/2016   Procedure: Lower Extremity Angiography;  Surgeon: Adrian Prows, MD;  Location: [facility];  Service: Cardiovascular;  Laterality: N/A;  . LOWER EXTREMITY INTERVENTION N/A 08/06/2016   Procedure: Lower Extremity Intervention;  Surgeon: Adrian Prows, MD;  Location: [facility];  Service: Cardiovascular;   Laterality: N/A;  . NECK SURGERY    . OTHER SURGICAL HISTORY  2013   bladder stimulator in back  . PERIPHERAL VASCULAR BALLOON ANGIOPLASTY  08/06/2016   Procedure: Peripheral Vascular Balloon Angioplasty;  Surgeon: Adrian Prows, MD;  Location: [place];  Service: Cardiovascular;;  Right SFA  . TENNIS ELBOW RELEASE/NIRSCHEL PROCEDURE Right 12/2015  . TOE SURGERY Right    right foot second and fourth toe  . TOTAL HIP ARTHROPLASTY Left   . TOTAL KNEE ARTHROPLASTY Right 01/12/2016   Procedure: RIGHT TOTAL KNEE ARTHROPLASTY;  Surgeon: Netta Cedars, MD;  Location: [facility];  Service: Orthopedics;  Laterality: Right;  . TUBAL LIGATION    . VAGINAL HYSTERECTOMY      Allergies: Bee venom; Morphine and related; Codeine; Adhesive [tape]; Sulfa antibiotics; and Sulfonamide derivatives  Medications: Prior to Admission medications   Medication Sig Start Date End Date Taking? Authorizing Provider  albuterol (PROVENTIL HFA;VENTOLIN HFA) 108 (90 BASE) MCG/ACT inhaler Inhale 2 puffs into the lungs every 4 (four) hours as needed for wheezing or shortness of breath (cough). 11/10/14  Yes Pisciotta, Elmyra Ricks, PA-C  aspirin 81 MG chewable tablet Chew 81 mg by mouth daily.   Yes [provider]  buPROPion (WELLBUTRIN XL) 150 MG 24 hr tablet Take 150-300 mg by mouth See admin instructions. TAKE 1 TABLET BY MOUTH IN THE MORNING 05/17/16  Yes  [provider]  Cholecalciferol (VITAMIN D-3) 1000 units CAPS Take by mouth daily.   Yes [provider]  clopidogrel (PLAVIX) 75 MG tablet Take 75 mg by mouth daily after breakfast. 06/26/16  Yes [provider]  docusate sodium (COLACE) 100 MG capsule Take 200 mg by mouth daily after breakfast.   Yes [provider]  DULoxetine (CYMBALTA) 60 MG capsule Take 60 mg by mouth daily after breakfast.    Yes [provider]  ferrous sulfate 325 (65 FE) MG tablet Take 1 tablet (325 mg total) by mouth daily with breakfast.  02/20/17  Yes Wyatt Portela, MD  hydrochlorothiazide (HYDRODIURIL) 12.5 MG tablet Take 12.5 mg by mouth daily after breakfast. 06/18/16  Yes [provider]  losartan-hydrochlorothiazide (HYZAAR) 100-25 MG tablet Take 1 tablet by mouth daily.   Yes [provider]  methocarbamol (ROBAXIN) 500 MG tablet Take 1 tablet (500 mg total) by mouth 3 (three) times daily as needed. 01/12/16  Yes Netta Cedars, MD  nitroGLYCERIN (NITROSTAT) 0.4 MG SL tablet Place 0.4 mg under the tongue every 5 (five) minutes x 3 doses as needed. For chest pain. 05/17/16  Yes [provider]  OVER THE COUNTER MEDICATION Apply 1 application topically as needed (joint pain). Magnilife topicall muscle rub   Yes [provider]  oxyCODONE-acetaminophen (PERCOCET) 7.5-325 MG tablet Take 1-2 tablets by mouth every 4 (four) hours as needed for severe pain. 01/12/16  Yes Netta Cedars, MD  rOPINIRole (REQUIP) 1 MG tablet Take 1 mg by mouth 4 (four) times daily.    Yes [provider]  Tolnaftate (ABSORBINE JR EX) Apply 1 application topically as needed (knee pain).   Yes [provider]  trolamine salicylate (ASPERCREME) 10 % cream Apply 1 application topically as needed (hand and neck pain).   Yes [provider]  aspirin-sod bicarb-citric acid (ALKA-SELTZER) 325 MG TBEF tablet Take 650 mg by mouth 3 (three) times daily after meals.    [provider]  buPROPion (ZYBAN) 150 MG 12 hr tablet TAKE 1 TABLET BY MOUTH ONCE DAILY FOR 3 DAYS, THEN 1 TABLET IN THE MORNING AND 1 TABLET AT 4PM 01/11/17   [provider]  calcium carbonate (TUMS EX) 750 MG chewable tablet Chew 2-4 tablets by mouth as needed for heartburn.    [provider]  lansoprazole (PREVACID 24HR) 15 MG capsule Take 15 mg by mouth daily as needed (for acid reflux).    [provider]  OVER THE COUNTER MEDICATION Apply 1 application topically daily as needed (leg pain). Horse  ligament otc muscle rub    [provider]     Family History  Problem Relation Age of Onset  . Hyperlipidemia Mother   . Hypertension Mother   . Hyperlipidemia Father   . Hypertension Father   . Alzheimer's disease Father   . Stroke Brother   . Cancer Daughter        unknown  . Colon cancer Neg Hx   . Esophageal cancer Neg Hx   . Rectal cancer Neg Hx   . Stomach cancer Neg Hx     Social History   Socioeconomic History  . Marital status: Married    Spouse name: Not on file  . Number of children: Not on file  . Years of education: Not on file  . Highest education level: Not on file  Occupational History  . Not on file  Social Needs  . Financial resource strain: Not on file  .  Food insecurity:    Worry: Not on file    Inability: Not on file  . Transportation needs:    Medical: Not on file    Non-medical: Not on file  Tobacco Use  . Smoking status: Current Every Day Smoker    Packs/day: 1.00    Years: 53.00    Pack years: 53.00  . Smokeless tobacco: Never Used  . Tobacco comment: DOWN TO 1 PACK DAY  Substance and Sexual Activity  . Alcohol use: No    Alcohol/week: 0.0 oz  . Drug use: No  . Sexual activity: Never  Lifestyle  . Physical activity:    Days per week: Not on file    Minutes per session: Not on file  . Stress: Not on file  Relationships  . Social connections:    Talks on phone: Not on file    Gets together: Not on file    Attends religious service: Not on file    Active member of club or organization: Not on file    Attends meetings of clubs or organizations: Not on file    Relationship status: Not on file  Other Topics Concern  . Not on file  Social History Narrative  . Not on file    Review of Systems: A 12 point ROS discussed and pertinent positives are indicated in the HPI above.  All other systems are negative.  Review of Systems  Constitutional: Negative.   Respiratory: Negative.   Cardiovascular: Negative.     Gastrointestinal: Negative.   Genitourinary: Negative.   Musculoskeletal: Positive for arthralgias and gait problem.    Vital Signs: BP (!) 140/98   Pulse 95   Temp 98.6 F (37 C) (Oral)   Resp 16   Ht 5' (1.524 m)   Wt 198 lb (89.8 kg)   SpO2 97%   BMI 38.67 kg/m   Physical Exam  Constitutional: She is oriented to person, place, and time.  Musculoskeletal:  Right lower extremity: Palpable varicosities of the posterior, medial proximal calf.  No ulcerations.  No significant edema.  Left lower extremity: No significant palpable varicosities.  No ulcerations or significant edema.  Neurological: She is alert and oriented to person, place, and time.  Skin:  No significant skin discoloration or hyperpigmentation in lower extremities.  Vitals reviewed.   Imaging: US Venous Img Lower Bilateral  Result Date: 07/15/2017 CLINICAL DATA:  Venous insufficiency, lower extremity edema and varicose veins. EXAM: BILATERAL LOWER EXTREMITY VENOUS DUPLEX ULTRASOUND TECHNIQUE: Gray-scale sonography with graded compression, as well as color Doppler and duplex ultrasound, were performed to evaluate the deep and superficial veins of both lower extremities. Spectral Doppler was utilized to evaluate flow at rest and with distal augmentation maneuvers. A complete superficial venous insufficiency exam was performed in the upright standing. I personally performed the technical portion of the exam. COMPARISON:  None. FINDINGS: RIGHT Deep Venous System: Evaluation of the deep venous system including the common femoral, femoral, profunda femoral, popliteal and calf veins (where visible) demonstrate no evidence of deep venous thrombosis. The vessels are compressible and demonstrate normal respiratory phasicity and response to augmentation. No evidence of deep venous reflux. RIGHT Superficial Venous System: SFJ: No reflux identified with augmentation. GSV Prox Thigh: 4 mm in diameter. No reflux identified with  augmentation. GSV Mid Thigh: No reflux identified. GSV Lower Thigh: No reflux identified. There are some varicosities visualized in the medial distal thigh that continue into the proximal calf. GSV Prox Calf: 4 mm in diameter. Varicosities  are seen communicating with the GSV in the proximal calf. There is sustained reflux with augmentation lasting approximately 3.4 seconds. No evidence of thrombus in communicating varicosities. GSV Mid Calf: No reflux identified. GSV Distal Calf: No reflux identified. SPJ: No true saphenopopliteal junction identified. SSV Prox: 3 mm in diameter. No reflux identified. There is at least one communicating small varicosity in the proximal calf. SSV Mid: No reflux identified. SSV Distal: No reflux identified. LEFT Deep Venous System: Evaluation of the deep venous system including the common femoral, femoral, profunda femoral, popliteal and calf veins (where visible) demonstrate no evidence of deep venous thrombosis. The vessels are compressible and demonstrate normal respiratory phasicity and response to augmentation. No evidence of deep venous reflux. LEFT Superficial Venous System: SFJ: No reflux identified with augmentation. GSV Prox Thigh: 6 mm in diameter.  No reflux identified. GSV Mid Thigh: No reflux identified. GSV Lower Thigh: No reflux identified. GSV Prox Calf: There are a few small varicosities of the proximal calf communicating with the proximal calf segment of the left GSV. No reflux identified in the left GSV at this level. GSV Mid Calf: No reflux identified. GSV Distal Calf: No reflux identified. SPJ: No true saphenopopliteal junction identified. SSV Prox: 5 mm in maximal diameter.  No reflux identified. SSV Mid: No reflux identified. SSV Distal: No reflux identified. IMPRESSION: 1. Venous insufficiency of the right great saphenous vein at the level of the proximal calf with associated communicating varicosities that extend into the distal thigh. Other segments of the  right GSV demonstrate no evidence of insufficiency. 2. Small varicosities communicating with the proximal right short saphenous vein. The right short saphenous vein does not demonstrate significant insufficiency. 3. Some varicosities of the proximal left calf that communicate with the left GSV. The left GSV does not demonstrate insufficiency. No evidence of left short saphenous vein insufficiency. Electronically Signed   By: Aletta Edouard M.D.   On: 07/15/2017 13:07   [place] Rad Eval And Mgmt  Result Date: 07/15/2017 Please refer to "Notes" to see consult details.   Labs:  CBC: Recent Labs    08/22/16 1451 01/09/17 1102 05/27/17 1203  WBC 5.5 5.7 5.1  HGB 9.6* 10.3* 13.2  HCT 30.9* 34.2* 40.3  PLT 245.0 256 178    COAGS: No results for input(s): INR, APTT in the last 8760 hours.  BMP: Recent Labs    07/31/16 1544 08/05/16 1210 08/06/16 0612  NA 135  --  136  K 3.5  --  3.4*  CL 101  --  101  CO2 25  --  29  GLUCOSE 84  --  104*  BUN 13 12 12   CALCIUM 9.3  --  8.7*  CREATININE 0.69 0.68 0.80  GFRNONAA >60  --  >60  GFRAA >60  --  >60    LIVER FUNCTION TESTS: Recent Labs    08/06/16 0612 01/09/17 1102  BILITOT 0.3  --   AST 17  --   ALT 22  --   ALKPHOS 112  --   PROT 6.2* 7.4  ALBUMIN 3.3*  --     Assessment and Plan:  Venous evaluation performed today of the lower extremities demonstrates normal deep venous system bilaterally.  Superficial venous evaluation demonstrates evidence of venous insufficiency of the right great saphenous vein at the level of the proximal calf with associated communicating varicosities of the distal thigh and proximal calf.  Other segments of the right GSV demonstrate no evidence of insufficiency.  There are  some small varicosities communicating with the proximal right short saphenous vein without significant insufficiency.  Some small varicosities of the proximal left calf communicate with the left GSV.  The left GSV does not  demonstrate insufficiency.  CEAP classification of the right lower extremity is C2, Ep, As, Pr.  Other than small varicosities, no significant venous pathology is identified in the left leg.  I recommended that she be formally fitted for thigh-high compression garments rated at 20-30 mmHg and begin wearing compression garments for a trial of conservative therapy.  I would like to follow-up with [name] in 2 to 3 months after a trial of conservative therapy.  Of more concern today was obvious severe acute pain of the left hip and left upper leg which severely limited [name]'s ability to ambulate and bear weight today.  I recommended that she be evaluated by orthopedic surgery soon to determine whether there may be an issue with her left hip arthroplasty.  She will call [employer] for an appointment.  Thank you for this interesting consult.  I greatly enjoyed meeting [name] and look forward to participating in their care.  A copy of this report was sent to the requesting provider on this date.  Electronically SignedAletta Edouard T 07/15/2017, 3:34 PM   I spent a total of 40 Minutes in face to face in clinical consultation, greater than 50% of which was counseling/coordinating care for lower extremity venous insufficiency.

## 2017-07-19 DIAGNOSIS — M25552 Pain in left hip: Secondary | ICD-10-CM | POA: Diagnosis not present

## 2017-07-23 ENCOUNTER — Other Ambulatory Visit: Payer: Self-pay | Admitting: Orthopedic Surgery

## 2017-07-23 DIAGNOSIS — G894 Chronic pain syndrome: Secondary | ICD-10-CM | POA: Diagnosis not present

## 2017-07-23 DIAGNOSIS — Z79891 Long term (current) use of opiate analgesic: Secondary | ICD-10-CM | POA: Diagnosis not present

## 2017-07-23 DIAGNOSIS — M47812 Spondylosis without myelopathy or radiculopathy, cervical region: Secondary | ICD-10-CM | POA: Diagnosis not present

## 2017-07-23 DIAGNOSIS — M150 Primary generalized (osteo)arthritis: Secondary | ICD-10-CM | POA: Diagnosis not present

## 2017-07-28 DIAGNOSIS — I701 Atherosclerosis of renal artery: Secondary | ICD-10-CM | POA: Diagnosis not present

## 2017-07-28 DIAGNOSIS — I739 Peripheral vascular disease, unspecified: Secondary | ICD-10-CM | POA: Diagnosis not present

## 2017-07-28 DIAGNOSIS — I10 Essential (primary) hypertension: Secondary | ICD-10-CM | POA: Diagnosis not present

## 2017-07-28 DIAGNOSIS — F17200 Nicotine dependence, unspecified, uncomplicated: Secondary | ICD-10-CM | POA: Diagnosis not present

## 2017-08-11 ENCOUNTER — Other Ambulatory Visit: Payer: Self-pay | Admitting: Orthopedic Surgery

## 2017-08-11 DIAGNOSIS — N390 Urinary tract infection, site not specified: Secondary | ICD-10-CM | POA: Diagnosis not present

## 2017-08-11 DIAGNOSIS — R300 Dysuria: Secondary | ICD-10-CM | POA: Diagnosis not present

## 2017-08-11 DIAGNOSIS — H9202 Otalgia, left ear: Secondary | ICD-10-CM | POA: Diagnosis not present

## 2017-08-12 ENCOUNTER — Ambulatory Visit: Payer: Medicare Other | Admitting: Rheumatology

## 2017-08-14 ENCOUNTER — Other Ambulatory Visit: Payer: Self-pay | Admitting: Orthopedic Surgery

## 2017-08-14 DIAGNOSIS — M25552 Pain in left hip: Secondary | ICD-10-CM

## 2017-08-15 ENCOUNTER — Ambulatory Visit
Admission: RE | Admit: 2017-08-15 | Discharge: 2017-08-15 | Disposition: A | Discharge status: Home / Self Care | Payer: Medicare Other | Source: Ambulatory Visit | Attending: Orthopedic Surgery | Admitting: Orthopedic Surgery

## 2017-08-15 DIAGNOSIS — Z471 Aftercare following joint replacement surgery: Secondary | ICD-10-CM | POA: Diagnosis not present

## 2017-08-15 DIAGNOSIS — Z96642 Presence of left artificial hip joint: Secondary | ICD-10-CM | POA: Diagnosis not present

## 2017-08-15 DIAGNOSIS — M25552 Pain in left hip: Secondary | ICD-10-CM

## 2017-08-20 ENCOUNTER — Telehealth: Payer: Self-pay | Admitting: *Deleted

## 2017-08-20 DIAGNOSIS — C569 Malignant neoplasm of unspecified ovary: Secondary | ICD-10-CM | POA: Diagnosis not present

## 2017-08-20 DIAGNOSIS — Z8541 Personal history of malignant neoplasm of cervix uteri: Secondary | ICD-10-CM | POA: Diagnosis not present

## 2017-08-20 DIAGNOSIS — Z8041 Family history of malignant neoplasm of ovary: Secondary | ICD-10-CM | POA: Diagnosis not present

## 2017-08-20 DIAGNOSIS — C539 Malignant neoplasm of cervix uteri, unspecified: Secondary | ICD-10-CM | POA: Diagnosis not present

## 2017-08-21 DIAGNOSIS — G894 Chronic pain syndrome: Secondary | ICD-10-CM | POA: Diagnosis not present

## 2017-08-21 DIAGNOSIS — Z79891 Long term (current) use of opiate analgesic: Secondary | ICD-10-CM | POA: Diagnosis not present

## 2017-08-21 DIAGNOSIS — M25552 Pain in left hip: Secondary | ICD-10-CM | POA: Diagnosis not present

## 2017-08-21 DIAGNOSIS — M47812 Spondylosis without myelopathy or radiculopathy, cervical region: Secondary | ICD-10-CM | POA: Diagnosis not present

## 2017-08-21 DIAGNOSIS — M150 Primary generalized (osteo)arthritis: Secondary | ICD-10-CM | POA: Diagnosis not present

## 2017-08-22 DIAGNOSIS — H02834 Dermatochalasis of left upper eyelid: Secondary | ICD-10-CM | POA: Diagnosis not present

## 2017-08-22 DIAGNOSIS — H02413 Mechanical ptosis of bilateral eyelids: Secondary | ICD-10-CM | POA: Diagnosis not present

## 2017-08-22 DIAGNOSIS — H02831 Dermatochalasis of right upper eyelid: Secondary | ICD-10-CM | POA: Diagnosis not present

## 2017-08-25 ENCOUNTER — Other Ambulatory Visit: Payer: Self-pay | Admitting: *Deleted

## 2017-08-25 DIAGNOSIS — Z006 Encounter for examination for normal comparison and control in clinical research program: Secondary | ICD-10-CM

## 2017-08-25 DIAGNOSIS — I739 Peripheral vascular disease, unspecified: Secondary | ICD-10-CM

## 2017-08-28 ENCOUNTER — Ambulatory Visit (HOSPITAL_COMMUNITY)
Admission: RE | Admit: 2017-08-28 | Discharge: 2017-08-28 | Disposition: A | Discharge status: Home / Self Care | Payer: Medicare Other | Source: Ambulatory Visit | Attending: Surgery | Admitting: Surgery

## 2017-08-28 ENCOUNTER — Ambulatory Visit (HOSPITAL_BASED_OUTPATIENT_CLINIC_OR_DEPARTMENT_OTHER)
Admission: RE | Admit: 2017-08-28 | Discharge: 2017-08-28 | Disposition: A | Discharge status: Home / Self Care | Payer: Medicare Other | Source: Ambulatory Visit | Attending: Surgery | Admitting: Surgery

## 2017-08-28 ENCOUNTER — Encounter: Payer: Medicare Other | Admitting: *Deleted

## 2017-08-28 VITALS — BP 136/93 | HR 104 | Resp 14 | Wt 203.0 lb

## 2017-08-28 DIAGNOSIS — Z006 Encounter for examination for normal comparison and control in clinical research program: Secondary | ICD-10-CM

## 2017-08-28 DIAGNOSIS — I739 Peripheral vascular disease, unspecified: Secondary | ICD-10-CM

## 2017-08-28 NOTE — Progress Notes (Signed)
[facility] 12 month visit  Pt doing ok. Having problems with left hip, needing partial hip replacement. Right leg not any better.  She is being followed for ptosis both eyelids, with Blepharoplasty OU, on August 22, 2017.  Iron deficiency anemia has been noted.  She has since had cataract surgery on April 23 2017 right and left was in April 02, 2017.  Today we attempted a 6MWT which failed test started at 1045 and ended at 1047, pt couldn't complete walk due to a mild seizure and I made patient sit down. Vitals were checked and Dr Einar Gip was notified. Pt was alert and orientated, her husband drove patient home. She has a history of seizures but discontinued her medication.   Walking Impairment Questionnaire (WIQ) 1a. Date WIQ completed: 2017-08-28  1b. Time: (HH:MM)   1030   Num Question Very Much Some Slight None  A. Walking Impairment  2a. Pain, aching, or cramps in calves (or buttocks)? ? ? ? ? ?  2b. If 2a ? None, please indicate which Leg: Both  3a. Pain or aching in thighs? ? ? ? ? ?  3b. If 3a ? None, please indicate which Leg Both  4.  Pain, stiffness, or aching in joints (ankles, knees, or hips) ? ? ? ? ?  5. Weakness in one or both legs ? ? ? RL ? ?  6. Pain or discomfort in your chest ? ? ? ? ?  7. Shortness of breath ? ? ? ? ?  8.  Heart Palpitations ? ? ? ? ?           9.              Other problems   Num Question Unable Much Some Slight None  B. Walking Distance  10. Walking indoors (i.e. around house) ? ? ? ? ?  11. Walking 50 feet ? ? ? ? ?  12. Walking 150 feet (1/2 block) ? ? ? ? ?          Num Question Unable Much Some Slight None  13. Walking 300 feet (1 block) ? ? ? ? ?  14. Walking 600 feet (2 blocks) ? ? ? ? ?  15.  Walking 900 feet (3 blocks) ? ? ? ? ?  16.  Walking 1500 feet (5 blocks) ? ? ? ? ?  Walking Speed:  17. Walking 1 block slowly ? ? ? ? ?  18.  Walking 1 block at an average speed? ? ? ? ? ?  19. Walking 1 block quickly? ? ? ? ? ?  20. Running or  jogging 1 block ? ? ? ? ?  Stair Climbing:  21. Climbing 1 flight of stairs ? ? ? ? ?  22. Climbing 2 flights of stairs ? ? ? ? ?  23. Climbing 3 flights of stairs. ? ? ? ? ?   Coordinator Signature: Philemon Kingdom :)    Date: 08/28/2017  PI Signature: see EPIC note for co-sign and dated [place] The following questions refer to blockages in the arteries of your body, particularly your legs, and how that might affect your life. Please read and complete the following questions. There are no right or wrong answers. Please mark the answer that best applies to you.  1. Blockages in the arteries, often referred to as peripheral vascular disease, affect different people in different ways. Some feel cramping or aching while others feel fatigue. Which leg (  or buttock) causes you the most severe discomfort, fatigue, pain, aching, or cramps? The RIGHT leg (buttock) the LEFT leg (buttocks) Both are the same Neither  ?    ?    ?       ?  2. Please review the list below and indicate how much limitation you have due to your peripheral vascular disease (discomfort, fatigue, pain, aching, or cramps in your calves (or buttocks)) over the past 4 weeks.   Place an X in one box on each line  Activity Extremely Limited Quite a bit Limited Moderately Limited Slightly Limited Not at all  Limited Limited for other reasons or did not do the activity.  Walking around your home ? ? ? ? ? ?  Walking 1-2 blocks on level ground ? ? ? ? ? ?  Walking 1-2 blocks up a hill ? ? ? ? ? ?  Walking 3-4 blocks on level ground ? ? ? ? ? ?  Hurrying or jogging (as if to catch a bus) ? ? ? ? ? ?  Vigorous work or exercise ? ? ? ? ? ?   3. Compared with 4 weeks ago, have your symptoms of peripheral vascular disease (discomfort, fatigue, pain, aching, or cramps in your calves (or buttocks) changed? My symptoms have become.  Much Worse Slightly worse Not changed Slightly better Much Better I have  had no symptoms over the past 4 weeks  ? ? ? ? ? ?         4. Over the past 4 weeks, how many times did you have discomfort, fatigue, pain, aching, or cramps in your calves (or buttocks)? All of the time Several times per day At least once a day 3 or more times per weeks but not every day 1-2 times per week Less than once a week Never over the past 4 weeks  ? ? ? ? ? ? ?     5. Over the past 4 weeks, how many times did you have discomfort, fatigue, pain, aching, or cramps in your calves (or buttocks) bothered you?  It has been. Extremely bothersome Moderately bothersome Somewhat bothersome Slightly bothersome Not at all bothersome I've had no leg discomfort  ? ? ? ? ? ?   6. Over the past 4 weeks, how often have you been awakened with pain, aching, or cramps in your legs or feet? Every night 3 or more times per week but not every night 1-2 times per week Less than once a week  Never over the past 4 weeks   ? ? ? ? ?   7. How satisfied are you that everything possible is being done to treat your peripheral vascular disease? Not satisfied at all  Mostly dissatisfied Somewhat satisfied Mostly satisfied Completely satisfied  ? ?  ?  ?  ?     8. How satisfied are you with the explanations your doctor has given you about your peripheral vascular disease? Not satisfied at all  Mostly dissatisfied Somewhat satisfied Mostly satisfied Completely satisfied  ? ?  ?  ?  ?    9. Overall, how satisfied are you with the current treatment of your peripheral vascular disease? Not satisfied at all  Mostly dissatisfied Somewhat satisfied Mostly satisfied Completely satisfied  ? ?  ?  ?  ?    10. Over the past 4 weeks, how much has your peripheral vascular disease limited your enjoyment of life? It has extremely limited my enjoyment of  life It has limited my enjoyment quite a bit  It has moderately limited my enjoyment of life It has slightly limited my enjoyment of life It has not limited my enjoyment of  life at all   ? ? ? ? ?   80. If you had to spend the rest of your life with your peripheral vascular disease the way it is right now, how would you feel about this? Not satisfied at all  Mostly dissatisfied Somewhat satisfied Mostly satisfied Completely satisfied  ? ?  ?  ?  ?    12. Over the past 4 weeks, how often have you felt discouraged or down in the dumps because of your peripheral vascular disease? I felt that way all of the time I felt that way most of the time I occasionally felt that way  I rarely felt that way I never felt that way  ? ? ? ? ?   13. How much does your peripheral vascular disease affect your lifestyle? Please indicate how your discomfort, fatigue, pain, aching, or cramps in your calves (or buttocks) may have limited your participation in the following activities over the past 4 weeks.   Please place an X in one box on each line Activity Severely limited Limited quite a bit Moderately limited Slightly limited Did not limit at all Does not apply or did not do for other reasons  Hobbies, recreational activities ? ? ? ? ? ?  Visiting family or friends out of your home ? ? ? ? ? ?  Working or doing household chores ? ? ? ? ? ?   Coordinator Signature: Philemon Kingdom :)    Date: 08/28/2017 PI Signature: see EPIC note for co-sign and dated    [facility] CLASSIFICATION:  ___ 0:  Asymptomatic, no hemodynamically significant occlusive disease _X__ 1:  Mild Claudication ___ 2. Moderate Claudication ___ 3. Severe Claudication ___ 4. Ischemic rest pain ___ 5. Minor tissue loss, non-healing ulcer, or focal gangrene with diffuse pedal   ischemia ___ 6. Major tissue loss, extending above trans-metatarsal level, functional foot no   longer salvageable.   [facility] Clinical Symptom Classification:  ___ Asymptomatic  ___ Mild Claudication/limb symptoms (no limit walking)  _X__ Moderate claudication/limb symptoms (able to walk without stopping >2 blocks  or 200  meters or  4 minutes).  ___ Severe claudication/limb symptoms (only able to walk without stopping <2 blocks  or 200 meters or 52minutes).   ___ Ischemic rest pain (pain in the distal limb at rest, felt to be due to limited arterial  Perfusion).  ___ Ischemic ulcers on distal leg  ___ Ischemic gangrene  Physical Exam: Date:  15-Aug-19 Time: 1030  Height: 32ft Weight: 92 kg  BP: 136-93      Right Leg Assessment:  ? YES   ? NO  Any abnormalities observed:  ? YES   ? NO   IF YES Clarify: Click or tap here to enter text.  Assessment:  Collected? Value  FEMORAL PULSE NO Choose an item.  POPITEAL PULSE NO Choose an item.  DORSALIS PEDIS PULSE YES +1  POSTERIOR TIBIAL PULSE YES +1      LEFT Leg Assessment:  ? YES   ? NO  Any abnormalities observed:  ? YES   ? NO   IF YES Clarify: Click or tap here to enter text.  Assessment:  Collected? Value  FEMORAL PULSE Choose an item. Choose an item.  POPITEAL PULSE Choose an item. Choose an item.  DORSALIS PEDIS PULSE Choose an item. Choose an item.  POSTERIOR TIBIAL PULSE Choose an item. Choose an item.

## 2017-08-29 ENCOUNTER — Encounter (HOSPITAL_COMMUNITY): Payer: Medicare Other

## 2017-09-02 NOTE — Telephone Encounter (Signed)
Pt called back and scheduled appt

## 2017-09-04 DIAGNOSIS — I10 Essential (primary) hypertension: Secondary | ICD-10-CM | POA: Diagnosis not present

## 2017-09-04 DIAGNOSIS — E785 Hyperlipidemia, unspecified: Secondary | ICD-10-CM | POA: Diagnosis not present

## 2017-09-04 DIAGNOSIS — H9202 Otalgia, left ear: Secondary | ICD-10-CM | POA: Diagnosis not present

## 2017-09-04 DIAGNOSIS — N390 Urinary tract infection, site not specified: Secondary | ICD-10-CM | POA: Diagnosis not present

## 2017-09-04 DIAGNOSIS — R300 Dysuria: Secondary | ICD-10-CM | POA: Diagnosis not present

## 2017-09-04 DIAGNOSIS — J449 Chronic obstructive pulmonary disease, unspecified: Secondary | ICD-10-CM | POA: Diagnosis not present

## 2017-09-22 ENCOUNTER — Other Ambulatory Visit: Payer: Self-pay | Admitting: Orthopedic Surgery

## 2017-09-22 DIAGNOSIS — Z79891 Long term (current) use of opiate analgesic: Secondary | ICD-10-CM | POA: Diagnosis not present

## 2017-09-22 DIAGNOSIS — M150 Primary generalized (osteo)arthritis: Secondary | ICD-10-CM | POA: Diagnosis not present

## 2017-09-22 DIAGNOSIS — G894 Chronic pain syndrome: Secondary | ICD-10-CM | POA: Diagnosis not present

## 2017-09-22 DIAGNOSIS — M47812 Spondylosis without myelopathy or radiculopathy, cervical region: Secondary | ICD-10-CM | POA: Diagnosis not present

## 2017-10-02 ENCOUNTER — Encounter (HOSPITAL_COMMUNITY): Payer: Self-pay

## 2017-10-02 NOTE — Patient Instructions (Addendum)
Your procedure is scheduled on: Monday, Sept. 30, 2019   Surgery Time:  7:15AM-10AM   Report to [facility]  Entrance    Report to admitting at 5:30 AM   Call this number if you have problems the morning of surgery [phone]   Do not eat food or drink liquids :After Midnight.   Brush your teeth the morning of surgery.   Do NOT smoke after Midnight   Take these medicines the morning of surgery with A SIP OF WATER: Wellbutrin, Cymbalta, Prevacid, Ropinirole   Bring Asthma Inhaler day of surgery              You may not have any metal on your body including hair pins, jewelry, and body piercings             Do not wear make-up, lotions, powders, perfumes/cologne, or deodorant             Do not wear nail polish.  Do not shave  48 hours prior to surgery.                Do not bring valuables to the hospital. [place].   Contacts, dentures or bridgework may not be worn into surgery.   Leave suitcase in the car. After surgery it may be brought to your room.    Special Instructions: Bring a copy of your healthcare power of attorney and living will documents         the day of surgery if you haven't scanned them in before.              Please read over the following fact sheets you were given:  [facility] - Preparing for Surgery Before surgery, you can play an important role.  Because skin is not sterile, your skin needs to be as free of germs as possible.  You can reduce the number of germs on your skin by washing with CHG (chlorahexidine gluconate) soap before surgery.  CHG is an antiseptic cleaner which kills germs and bonds with the skin to continue killing germs even after washing. Please DO NOT use if you have an allergy to CHG or antibacterial soaps.  If your skin becomes reddened/irritated stop using the CHG and inform your nurse when you arrive at Short Stay. Do not shave (including legs and underarms)  for at least 48 hours prior to the first CHG shower.  You may shave your face/neck.  Please follow these instructions carefully:  1.  Shower with CHG Soap the night before surgery and the  morning of surgery.  2.  If you choose to wash your hair, wash your hair first as usual with your normal  shampoo.  3.  After you shampoo, rinse your hair and body thoroughly to remove the shampoo.                             4.  Use CHG as you would any other liquid soap.  You can apply chg directly to the skin and wash.  Gently with a scrungie or clean washcloth.  5.  Apply the CHG Soap to your body ONLY FROM THE NECK DOWN.   Do   not use on face/ open  Wound or open sores. Avoid contact with eyes, ears mouth and   genitals (private parts).                       Wash face,  Genitals (private parts) with your normal soap.             6.  Wash thoroughly, paying special attention to the area where your    surgery  will be performed.  7.  Thoroughly rinse your body with warm water from the neck down.  8.  DO NOT shower/wash with your normal soap after using and rinsing off the CHG Soap.                9.  Pat yourself dry with a clean towel.            10.  Wear clean pajamas.            11.  Place clean sheets on your bed the night of your first shower and do not  sleep with pets. Day of Surgery : Do not apply any lotions/deodorants the morning of surgery.  Please wear clean clothes to the hospital/surgery center.  FAILURE TO FOLLOW THESE INSTRUCTIONS MAY RESULT IN THE CANCELLATION OF YOUR SURGERY  PATIENT SIGNATURE_________________________________  NURSE SIGNATURE__________________________________  ________________________________________________________________________   Adam Phenix  An incentive spirometer is a tool that can help keep your lungs clear and active. This tool measures how well you are filling your lungs with each breath. Taking long deep breaths may  help reverse or decrease the chance of developing breathing (pulmonary) problems (especially infection) following:  A long period of time when you are unable to move or be active. BEFORE THE PROCEDURE   If the spirometer includes an indicator to show your best effort, your nurse or respiratory therapist will set it to a desired goal.  If possible, sit up straight or lean slightly forward. Try not to slouch.  Hold the incentive spirometer in an upright position. INSTRUCTIONS FOR USE  1. Sit on the edge of your bed if possible, or sit up as far as you can in bed or on a chair. 2. Hold the incentive spirometer in an upright position. 3. Breathe out normally. 4. Place the mouthpiece in your mouth and seal your lips tightly around it. 5. Breathe in slowly and as deeply as possible, raising the piston or the ball toward the top of the column. 6. Hold your breath for 3-5 seconds or for as long as possible. Allow the piston or ball to fall to the bottom of the column. 7. Remove the mouthpiece from your mouth and breathe out normally. 8. Rest for a few seconds and repeat Steps 1 through 7 at least 10 times every 1-2 hours when you are awake. Take your time and take a few normal breaths between deep breaths. 9. The spirometer may include an indicator to show your best effort. Use the indicator as a goal to work toward during each repetition. 10. After each set of 10 deep breaths, practice coughing to be sure your lungs are clear. If you have an incision (the cut made at the time of surgery), support your incision when coughing by placing a pillow or rolled up towels firmly against it. Once you are able to get out of bed, walk around indoors and cough well. You may stop using the incentive spirometer when instructed by your caregiver.  RISKS AND COMPLICATIONS  Take your time  so you do not get dizzy or light-headed.  If you are in pain, you may need to take or ask for pain medication before doing  incentive spirometry. It is harder to take a deep breath if you are having pain. AFTER USE  Rest and breathe slowly and easily.  It can be helpful to keep track of a log of your progress. Your caregiver can provide you with a simple table to help with this. If you are using the spirometer at home, follow these instructions: [place] IF:   You are having difficultly using the spirometer.  You have trouble using the spirometer as often as instructed.  Your pain medication is not giving enough relief while using the spirometer.  You develop fever of 100.5 F (38.1 C) or higher. SEEK IMMEDIATE MEDICAL CARE IF:   You cough up bloody sputum that had not been present before.  You develop fever of 102 F (38.9 C) or greater.  You develop worsening pain at or near the incision site. MAKE SURE YOU:   Understand these instructions.  Will watch your condition.  Will get help right away if you are not doing well or get worse. Document Released: 05/13/2006 Document Revised: 03/25/2011 Document Reviewed: 07/14/2006 Exit[employer] Patient Information 2014 [employer], [place].   ________________________________________________________________________  WHAT IS A BLOOD TRANSFUSION? Blood Transfusion Information  A transfusion is the replacement of blood or some of its parts. Blood is made up of multiple cells which provide different functions.  Red blood cells carry oxygen and are used for blood loss replacement.  White blood cells fight against infection.  Platelets control bleeding.  Plasma helps clot blood.  Other blood products are available for specialized needs, such as hemophilia or other clotting disorders. BEFORE THE TRANSFUSION  Who gives blood for transfusions?   Healthy volunteers who are fully evaluated to make sure their blood is safe. This is blood bank blood. Transfusion therapy is the safest it has ever been in the practice of medicine. Before blood is taken from a  donor, a complete history is taken to make sure that person has no history of diseases nor engages in risky social behavior (examples are intravenous drug use or sexual activity with multiple partners). The donor's travel history is screened to minimize risk of transmitting infections, such as malaria. The donated blood is tested for signs of infectious diseases, such as HIV and hepatitis. The blood is then tested to be sure it is compatible with you in order to minimize the chance of a transfusion reaction. If you or a relative donates blood, this is often done in anticipation of surgery and is not appropriate for emergency situations. It takes many days to process the donated blood. RISKS AND COMPLICATIONS Although transfusion therapy is very safe and saves many lives, the main dangers of transfusion include:   Getting an infectious disease.  Developing a transfusion reaction. This is an allergic reaction to something in the blood you were given. Every precaution is taken to prevent this. The decision to have a blood transfusion has been considered carefully by your caregiver before blood is given. Blood is not given unless the benefits outweigh the risks. AFTER THE TRANSFUSION  Right after receiving a blood transfusion, you will usually feel much better and more energetic. This is especially true if your red blood cells have gotten low (anemic). The transfusion raises the level of the red blood cells which carry oxygen, and this usually causes an energy increase.  The  nurse administering the transfusion will monitor you carefully for complications. HOME CARE INSTRUCTIONS  No special instructions are needed after a transfusion. You may find your energy is better. Speak with your caregiver about any limitations on activity for underlying diseases you may have. SEEK MEDICAL CARE IF:   Your condition is not improving after your transfusion.  You develop redness or irritation at the intravenous (IV)  site. SEEK IMMEDIATE MEDICAL CARE IF:  Any of the following symptoms occur over the next 12 hours:  Shaking chills.  You have a temperature by mouth above 102 F (38.9 C), not controlled by medicine.  Chest, back, or muscle pain.  People around you feel you are not acting correctly or are confused.  Shortness of breath or difficulty breathing.  Dizziness and fainting.  You get a rash or develop hives.  You have a decrease in urine output.  Your urine turns a dark color or changes to pink, red, or brown. Any of the following symptoms occur over the next 10 days:  You have a temperature by mouth above 102 F (38.9 C), not controlled by medicine.  Shortness of breath.  Weakness after normal activity.  The white part of the eye turns yellow (jaundice).  You have a decrease in the amount of urine or are urinating less often.  Your urine turns a dark color or changes to pink, red, or brown. Document Released: 12/29/1999 Document Revised: 03/25/2011 Document Reviewed: 08/17/2007 [facility] Patient Information 2014 [place], [place].  _______________________________________________________________________

## 2017-10-06 ENCOUNTER — Ambulatory Visit (HOSPITAL_COMMUNITY)
Admission: RE | Admit: 2017-10-06 | Discharge: 2017-10-06 | Disposition: A | Discharge status: Home / Self Care | Payer: Medicare Other | Source: Ambulatory Visit | Attending: Orthopedic Surgery | Admitting: Orthopedic Surgery

## 2017-10-06 ENCOUNTER — Other Ambulatory Visit: Payer: Self-pay

## 2017-10-06 ENCOUNTER — Encounter (HOSPITAL_COMMUNITY): Payer: Self-pay

## 2017-10-06 ENCOUNTER — Encounter (HOSPITAL_COMMUNITY)
Admission: RE | Admit: 2017-10-06 | Discharge: 2017-10-06 | Disposition: A | Discharge status: Home / Self Care | Payer: Medicare Other | Source: Ambulatory Visit | Attending: Orthopedic Surgery | Admitting: Orthopedic Surgery

## 2017-10-06 ENCOUNTER — Encounter (INDEPENDENT_AMBULATORY_CARE_PROVIDER_SITE_OTHER): Payer: Self-pay

## 2017-10-06 DIAGNOSIS — R9431 Abnormal electrocardiogram [ECG] [EKG]: Secondary | ICD-10-CM | POA: Insufficient documentation

## 2017-10-06 DIAGNOSIS — Z01818 Encounter for other preprocedural examination: Secondary | ICD-10-CM

## 2017-10-06 DIAGNOSIS — X58XXXA Exposure to other specified factors, initial encounter: Secondary | ICD-10-CM | POA: Diagnosis not present

## 2017-10-06 DIAGNOSIS — S72112A Displaced fracture of greater trochanter of left femur, initial encounter for closed fracture: Secondary | ICD-10-CM | POA: Diagnosis not present

## 2017-10-06 DIAGNOSIS — J449 Chronic obstructive pulmonary disease, unspecified: Secondary | ICD-10-CM | POA: Diagnosis not present

## 2017-10-06 HISTORY — DX: Other intervertebral disc degeneration, lumbar region without mention of lumbar back pain or lower extremity pain: M51.369

## 2017-10-06 HISTORY — DX: Depression, unspecified: F32.A

## 2017-10-06 HISTORY — DX: Personal history of colon polyps, unspecified: Z86.0100

## 2017-10-06 HISTORY — DX: Other cervical disc degeneration, unspecified cervical region: M50.30

## 2017-10-06 HISTORY — DX: Weakness: R53.1

## 2017-10-06 HISTORY — DX: Asymptomatic varicose veins of unspecified lower extremity: I83.90

## 2017-10-06 HISTORY — DX: Other hemorrhoids: K64.8

## 2017-10-06 HISTORY — DX: Major depressive disorder, single episode, unspecified: F32.9

## 2017-10-06 HISTORY — DX: Anxiety disorder, unspecified: F41.9

## 2017-10-06 HISTORY — DX: Gastroparesis: K31.84

## 2017-10-06 HISTORY — DX: Iron deficiency anemia, unspecified: D50.9

## 2017-10-06 HISTORY — DX: Personal history of other diseases of the nervous system and sense organs: Z86.69

## 2017-10-06 HISTORY — DX: Atherosclerosis of aorta: I70.0

## 2017-10-06 HISTORY — DX: Solitary pulmonary nodule: R91.1

## 2017-10-06 HISTORY — DX: Other specified soft tissue disorders: M79.89

## 2017-10-06 HISTORY — DX: Unspecified osteoarthritis, unspecified site: M19.90

## 2017-10-06 HISTORY — DX: Other intervertebral disc degeneration, lumbar region: M51.36

## 2017-10-06 HISTORY — DX: Peripheral vascular disease, unspecified: I73.9

## 2017-10-06 HISTORY — DX: Venous insufficiency (chronic) (peripheral): I87.2

## 2017-10-06 HISTORY — DX: Urge incontinence: N39.41

## 2017-10-06 HISTORY — DX: Spondylosis, unspecified: M47.9

## 2017-10-06 HISTORY — DX: Other specified abnormal immunological findings in serum: R76.8

## 2017-10-06 HISTORY — DX: Other chronic pain: G89.29

## 2017-10-06 HISTORY — DX: Unspecified cataract: H26.9

## 2017-10-06 HISTORY — DX: Dorsalgia, unspecified: M54.9

## 2017-10-06 HISTORY — DX: Carpal tunnel syndrome, unspecified upper limb: G56.00

## 2017-10-06 HISTORY — DX: Personal history of other medical treatment: Z92.89

## 2017-10-06 HISTORY — DX: Personal history of colonic polyps: Z86.010

## 2017-10-06 HISTORY — DX: Restless legs syndrome: G25.81

## 2017-10-06 HISTORY — DX: Raynaud's syndrome without gangrene: I73.00

## 2017-10-06 HISTORY — DX: Diverticulosis of intestine, part unspecified, without perforation or abscess without bleeding: K57.90

## 2017-10-06 HISTORY — DX: Unspecified hearing loss, unspecified ear: H91.90

## 2017-10-06 HISTORY — DX: Malignant neoplasm of cervix uteri, unspecified: C53.9

## 2017-10-06 LAB — CBC WITH DIFFERENTIAL/PLATELET
Basophils Absolute: 0 10*3/uL (ref 0.0–0.1)
Basophils Relative: 0 %
EOS ABS: 0.2 10*3/uL (ref 0.0–0.7)
EOS PCT: 4 %
HCT: 39.2 % (ref 36.0–46.0)
Hemoglobin: 12.9 g/dL (ref 12.0–15.0)
Lymphocytes Relative: 30 %
Lymphs Abs: 1.9 10*3/uL (ref 0.7–4.0)
MCH: 29.1 pg (ref 26.0–34.0)
MCHC: 32.9 g/dL (ref 30.0–36.0)
MCV: 88.3 fL (ref 78.0–100.0)
Monocytes Absolute: 0.6 10*3/uL (ref 0.1–1.0)
Monocytes Relative: 9 %
NEUTROS ABS: 3.6 10*3/uL (ref 1.7–7.7)
NEUTROS PCT: 57 %
Platelets: 225 10*3/uL (ref 150–400)
RBC: 4.44 MIL/uL (ref 3.87–5.11)
RDW: 15.1 % (ref 11.5–15.5)
WBC: 6.3 10*3/uL (ref 4.0–10.5)

## 2017-10-06 LAB — URINALYSIS, ROUTINE W REFLEX MICROSCOPIC
Bilirubin Urine: NEGATIVE
Glucose, UA: NEGATIVE mg/dL
Hgb urine dipstick: NEGATIVE
Ketones, ur: NEGATIVE mg/dL
Leukocytes, UA: NEGATIVE
Nitrite: NEGATIVE
Protein, ur: NEGATIVE mg/dL
Specific Gravity, Urine: 1.012 (ref 1.005–1.030)
pH: 5 (ref 5.0–8.0)

## 2017-10-06 LAB — BASIC METABOLIC PANEL WITH GFR
Anion gap: 7 (ref 5–15)
BUN: 20 mg/dL (ref 8–23)
CO2: 28 mmol/L (ref 22–32)
Calcium: 9.2 mg/dL (ref 8.9–10.3)
Chloride: 103 mmol/L (ref 98–111)
Creatinine, Ser: 0.77 mg/dL (ref 0.44–1.00)
GFR calc Af Amer: >60 mL/min
GFR calc non Af Amer: >60 mL/min
Glucose, Bld: 81 mg/dL (ref 70–99)
Potassium: 4.2 mmol/L (ref 3.5–5.1)
Sodium: 138 mmol/L (ref 135–145)

## 2017-10-06 LAB — APTT: aPTT: 37 s — ABNORMAL HIGH (ref 24–36)

## 2017-10-06 LAB — SURGICAL PCR SCREEN
MRSA, PCR: NEGATIVE
Staphylococcus aureus: POSITIVE — AB

## 2017-10-06 LAB — PROTIME-INR
INR: 0.92
Prothrombin Time: 12.3 s (ref 11.4–15.2)

## 2017-10-07 LAB — ABO/RH: ABO/RH(D): O POS

## 2017-10-07 NOTE — Pre-Procedure Instructions (Signed)
PCR results 10/06/2017 faxed to Dr. Mayer Camel via epic.

## 2017-10-07 NOTE — Pre-Procedure Instructions (Signed)
[name] made aware that Mupirocin has been called into her pharmacy and she will need to begin treatment immediately.  She verbalized understanding.

## 2017-10-09 NOTE — Pre-Procedure Instructions (Signed)
Cardiac clearance 10/08/2017 from Dr. Einar Gip on chart .

## 2017-10-10 DIAGNOSIS — Z96649 Presence of unspecified artificial hip joint: Secondary | ICD-10-CM

## 2017-10-10 DIAGNOSIS — H02834 Dermatochalasis of left upper eyelid: Secondary | ICD-10-CM | POA: Diagnosis not present

## 2017-10-10 DIAGNOSIS — T84018A Broken internal joint prosthesis, other site, initial encounter: Secondary | ICD-10-CM

## 2017-10-10 DIAGNOSIS — H02831 Dermatochalasis of right upper eyelid: Secondary | ICD-10-CM | POA: Diagnosis not present

## 2017-10-10 NOTE — H&P (Signed)
TOTAL HIP REVISION ADMISSION H&P  Patient is admitted for left revision total hip arthroplasty.  Subjective:  Chief Complaint: left hip pain  HPI: [name], 67 y.o. female, has a history of pain and functional disability in the left hip due to poly wear and patient has failed non-surgical conservative treatments for greater than 12 weeks to include NSAID's and/or analgesics, flexibility and strengthening excercises, use of assistive devices and activity modification. The indications for the revision total hip arthroplasty are bearing surface wear leading to  symptomatic synovitis and hip instability.  Onset of symptoms was gradual starting 1 years ago with gradually worsening course since that time.  Prior procedures on the left hip include arthroplasty.  Patient currently rates pain in the left hip at 10 out of 10 with activity.  There is night pain, worsening of pain with activity and weight bearing, pain that interfers with activities of daily living, pain with passive range of motion and joint swelling. Patient has evidence of joint space narrowing by imaging studies.  This condition presents safety issues increasing the risk of falls.    There is no current active infection.  Patient Active Problem List   Diagnosis Date Noted  . Positive ANA (antinuclear antibody) 02/06/2017  . Raynaud's phenomenon without gangrene 02/06/2017  . Primary osteoarthritis of both hands 02/06/2017  . DDD (degenerative disc disease), cervical 02/06/2017  . DDD (degenerative disc disease), lumbar 02/06/2017  . History of left hip replacement 02/06/2017  . Neck pain on left side 08/05/2016  . Claudication in peripheral vascular disease ([place]) 07/14/2016  . H/O total knee replacement, right 01/12/2016  . Pain in joint, lower leg 05/09/2014  . Convulsion ([place]) 01/17/2014  . Left-sided weakness 01/17/2014  . 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 Left hand weakness 08/24/2012  . CERVICAL CANCER  04/03/2007  . COLONIC POLYPS 04/03/2007  . OVERWEIGHT 04/03/2007  . ANXIETY DEPRESSION 04/03/2007  . TOBACCO ABUSE 04/03/2007  . Essential hypertension 04/03/2007  . INTERNAL HEMORRHOIDS 04/03/2007  . GERD 04/03/2007  . Gastroparesis 04/03/2007  . Diverticulosis of colon 04/03/2007  . BACK PAIN, CHRONIC 04/03/2007  . CHEST PAIN, ATYPICAL 04/03/2007   Past Medical History:  Diagnosis Date  . Anxiety   . Aortic atherosclerosis ([place])   . Bilateral cataracts   . Carpal tunnel syndrome   . Cervical cancer ([place])   . Chronic back pain   . Claudication ([place])   . COPD (chronic obstructive pulmonary disease) ([facility])    wheezing  . DDD (degenerative disc disease), cervical   . DDD (degenerative disc disease), lumbar   . Depression   . Diverticulosis   . Gastroparesis   . GERD (gastroesophageal reflux disease)   . Hearing loss    Bilateral  . History of blood transfusion   . History of colon polyps   . History of migraine   . Hypertension   . Internal hemorrhoids   . Iron deficiency anemia   . Left-sided weakness   . Leg swelling   . Liver disease    pt unaware  . Lung nodule    several small  . OA (osteoarthritis)    both hands  . Positive ANA (antinuclear antibody)   . Pre-diabetes   . Raynaud's phenomenon   . Restless leg   . Seizures ([place])    no meds for 8 months  . Sleep apnea    does not use her cpap  . Spondylosis   . Stroke ([place]) 2015   no  weakness  . 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   rt  . Urge incontinence of urine   . Varicose vein of leg   . Venous insufficiency     Past Surgical History:  Procedure Laterality Date  . BACK SURGERY    . BLEPHAROPLASTY  10/10/2017  . CATARACT EXTRACTION, BILATERAL    . CESAREAN SECTION    . CHOLECYSTECTOMY    . COLONOSCOPY  10/2016  . HAND SURGERY Bilateral    carpal tunnel  . HEMORRHOID SURGERY    . INTERSTIM IMPLANT PLACEMENT    . KNEE SURGERY Left    arthroscopy x3  . LOWER EXTREMITY ANGIOGRAPHY N/A 07/16/2016    Procedure: Lower Extremity Angiography;  Surgeon: Adrian Prows, MD;  Location: [facility];  Service: Cardiovascular;  Laterality: N/A;  . LOWER EXTREMITY INTERVENTION N/A 08/06/2016   Procedure: Lower Extremity Intervention;  Surgeon: Adrian Prows, MD;  Location: [facility];  Service: Cardiovascular;  Laterality: N/A;  . NECK SURGERY    . OTHER SURGICAL HISTORY  2013   bladder stimulator in back  . PERIPHERAL VASCULAR BALLOON ANGIOPLASTY  08/06/2016   Procedure: Peripheral Vascular Balloon Angioplasty;  Surgeon: Adrian Prows, MD;  Location: [facility];  Service: Cardiovascular;;  Right SFA  . TENNIS ELBOW RELEASE/NIRSCHEL PROCEDURE Right 12/2015  . TOE SURGERY Right    right foot second and fourth toe  . TOTAL HIP ARTHROPLASTY Left   . TOTAL KNEE ARTHROPLASTY Right 01/12/2016   Procedure: RIGHT TOTAL KNEE ARTHROPLASTY;  Surgeon: Netta Cedars, MD;  Location: [facility];  Service: Orthopedics;  Laterality: Right;  . TUBAL LIGATION    . UPPER GI ENDOSCOPY  10/2016  . VAGINAL HYSTERECTOMY      No current facility-administered medications for this encounter.    Current Outpatient Medications  Medication Sig Dispense Refill Last Dose  . albuterol (PROVENTIL HFA;VENTOLIN HFA) 108 (90 BASE) MCG/ACT inhaler Inhale 2 puffs into the lungs every 4 (four) hours as needed for wheezing or shortness of breath (cough). 1 Inhaler 0   . aspirin 81 MG chewable tablet Chew 81 mg by mouth daily.     Marland Kitchen aspirin-sod bicarb-citric acid (ALKA-SELTZER) 325 MG TBEF tablet Take 650 mg by mouth every 8 (eight) hours as needed (nausea/indigestion.).      Marland Kitchen bisoprolol (ZEBETA) 10 MG tablet Take 10 mg by mouth daily.  4   . buPROPion (ZYBAN) 150 MG 12 hr tablet Take 150 mg by mouth 2 (two) times daily.   2   . calcium carbonate (TUMS EX) 750 MG chewable tablet Chew 2-4 tablets by mouth 4 (four) times daily as needed for heartburn.      . Cholecalciferol (VITAMIN D-3) 5000 units TABS Take 5,000 Units by mouth  daily.     . clopidogrel (PLAVIX) 75 MG tablet Take 75 mg by mouth daily after breakfast.  3   . docusate sodium (COLACE) 100 MG capsule Take 100-200 mg by mouth daily as needed (for constipation.).      Marland Kitchen DULoxetine (CYMBALTA) 60 MG capsule Take 60 mg by mouth daily after breakfast.      . ferrous sulfate 325 (65 FE) MG tablet Take 1 tablet (325 mg total) by mouth daily with breakfast. 90 tablet 3   . hydrochlorothiazide (HYDRODIURIL) 25 MG tablet Take 25 mg by mouth daily.     . lansoprazole (PREVACID 24HR) 15 MG capsule Take 15 mg by mouth daily as needed (for acid reflux).     Marland Kitchen  losartan-hydrochlorothiazide (HYZAAR) 100-25 MG tablet Take 1 tablet by mouth daily.     . methocarbamol (ROBAXIN) 500 MG tablet Take 1 tablet (500 mg total) by mouth 3 (three) times daily as needed. (Patient taking differently: Take 500 mg by mouth every 8 (eight) hours as needed for muscle spasms. ) 60 tablet 1   . nitroGLYCERIN (NITROSTAT) 0.4 MG SL tablet Place 0.4 mg under the tongue every 5 (five) minutes x 3 doses as needed. For chest pain.  1   . OVER THE COUNTER MEDICATION Apply 1 application topically as needed (joint pain). Magnilife topicall muscle rub     . oxyCODONE-acetaminophen (PERCOCET) 7.5-325 MG tablet Take 1-2 tablets by mouth every 4 (four) hours as needed for severe pain. 60 tablet 0   . rOPINIRole (REQUIP) 1 MG tablet Take 1 mg by mouth 3 (three) times daily.      . Tolnaftate (ABSORBINE JR EX) Apply 1 application topically 4 (four) times daily as needed (knee pain).      Marland Kitchen XIIDRA 5 % SOLN Place 1 drop into both eyes 2 (two) times daily as needed (for dry eyes).      Allergies  Allergen Reactions  . Bee Venom Anaphylaxis  . Morphine And Related Other (See Comments)    Overly sedated  . Codeine Hives  . Adhesive [Tape] Other (See Comments)    Redness, Paper tape is ok  . Sulfa Antibiotics Other (See Comments)    Unspecified reaction from childhood  . Sulfonamide Derivatives Other (See  Comments)    UNSPECIFIED REACTION OF CHILDHOOD     Social History   Tobacco Use  . Smoking status: Current Every Day Smoker    Packs/day: 1.00    Years: 53.00    Pack years: 53.00  . Smokeless tobacco: Never Used  . Tobacco comment: DOWN TO 1 PACK DAY  Substance Use Topics  . Alcohol use: No    Alcohol/week: 0.0 standard drinks    Family History  Problem Relation Age of Onset  . Hyperlipidemia Mother   . Hypertension Mother   . Hyperlipidemia Father   . Hypertension Father   . Alzheimer's disease Father   . Stroke Brother   . Cancer Daughter        unknown  . Colon cancer Neg Hx   . Esophageal cancer Neg Hx   . Rectal cancer Neg Hx   . Stomach cancer Neg Hx       Review of Systems  Constitutional: Negative.   HENT: Positive for hearing loss.   Eyes: Negative.   Cardiovascular: Positive for leg swelling.       Htn  Gastrointestinal: Negative.   Genitourinary: Positive for frequency.  Musculoskeletal: Positive for joint pain and myalgias.  Skin: Negative.   Neurological: Negative.   Endo/Heme/Allergies: Negative.   Psychiatric/Behavioral: Negative.     Objective:  Physical Exam  Constitutional: She is oriented to person, place, and time. She appears well-developed and well-nourished.  HENT:  Head: Normocephalic and atraumatic.  Eyes: Pupils are equal, round, and reactive to light.  Neck: Normal range of motion. Neck supple.  Cardiovascular: Intact distal pulses.  Respiratory: Effort normal.  Musculoskeletal: She exhibits tenderness.  Patient walks with a significant left-sided limp is tender over the greater trochanter.  Internal next rotation of the hip does cause some irritability.  Her knee also has some tenderness along the joint lines.  I cannot palpate an effusion.    Neurological: She is alert and oriented to person,  place, and time.  Skin: Skin is warm and dry.  Psychiatric: She has a normal mood and affect. Her behavior is normal. Judgment and  thought content normal.    Vital signs in last 24 hours:     Labs:   Estimated body mass index is 39.4 kg/m as calculated from the following:   Height as of 10/06/17: 5\' 1"  (1.549 m).   Weight as of 10/06/17: 94.6 kg.  Imaging Review:  CT scan images are reviewed in detail with the patient.  They do show a large cyst eroding into the greater trochanter of the left hip with some nondisplaced fracture lines.  There is also a smaller cyst about a centimeter in size above the acetabulum.     Preoperative templating of the joint replacement has been completed, documented, and submitted to the Operating Room personnel in order to optimize intra-operative equipment management.   Assessment/Plan:  End stage arthritis, left hip(s) with failed previous arthroplasty.  The patient history, physical examination, clinical judgement of the provider and imaging studies are consistent with end stage degenerative joint disease of the left hip(s), previous total hip arthroplasty. Revision total hip arthroplasty is deemed medically necessary. The treatment options including medical management, injection therapy, arthroscopy and arthroplasty were discussed at length. The risks and benefits of total hip arthroplasty were presented and reviewed. The risks due to aseptic loosening, infection, stiffness, dislocation/subluxation,  thromboembolic complications and other imponderables were discussed.  The patient acknowledged the explanation, agreed to proceed with the plan and consent was signed. Patient is being admitted for inpatient treatment for surgery, pain control, PT, OT, prophylactic antibiotics, VTE prophylaxis, progressive ambulation and ADL's and discharge planning. The patient is planning to be discharged home with home health services.

## 2017-10-12 MED ORDER — BUPIVACAINE LIPOSOME 1.3 % IJ SUSP
10.0000 mL | Freq: Once | INTRAMUSCULAR | Status: DC
  Filled 2017-10-12: qty 20

## 2017-10-12 MED ORDER — TRANEXAMIC ACID 1000 MG/10ML IV SOLN
2000.0000 mg | INTRAVENOUS | Status: DC
  Filled 2017-10-12: qty 20

## 2017-10-13 ENCOUNTER — Inpatient Hospital Stay (HOSPITAL_COMMUNITY): Payer: Medicare Other

## 2017-10-13 ENCOUNTER — Inpatient Hospital Stay (HOSPITAL_COMMUNITY): Payer: Medicare Other | Admitting: Anesthesiology

## 2017-10-13 ENCOUNTER — Inpatient Hospital Stay (HOSPITAL_COMMUNITY): Payer: Medicare Other | Admitting: Vascular Surgery

## 2017-10-13 ENCOUNTER — Encounter (HOSPITAL_COMMUNITY)
Admission: RE | Disposition: A | Discharge status: Home Health | Payer: Self-pay | Source: Ambulatory Visit | Attending: Orthopedic Surgery

## 2017-10-13 ENCOUNTER — Inpatient Hospital Stay (HOSPITAL_COMMUNITY)
Admission: RE | Admit: 2017-10-13 | Discharge: 2017-10-15 | DRG: 466 | Disposition: A | Discharge status: Home Health | Payer: Medicare Other | Source: Ambulatory Visit | Attending: Orthopedic Surgery | Admitting: Orthopedic Surgery

## 2017-10-13 ENCOUNTER — Encounter (HOSPITAL_COMMUNITY): Payer: Self-pay | Admitting: Anesthesiology

## 2017-10-13 ENCOUNTER — Other Ambulatory Visit: Payer: Self-pay

## 2017-10-13 DIAGNOSIS — H9193 Unspecified hearing loss, bilateral: Secondary | ICD-10-CM | POA: Diagnosis present

## 2017-10-13 DIAGNOSIS — Z8673 Personal history of transient ischemic attack (TIA), and cerebral infarction without residual deficits: Secondary | ICD-10-CM | POA: Diagnosis not present

## 2017-10-13 DIAGNOSIS — I700 Atherosclerosis of aorta: Secondary | ICD-10-CM | POA: Diagnosis present

## 2017-10-13 DIAGNOSIS — Z7951 Long term (current) use of inhaled steroids: Secondary | ICD-10-CM | POA: Diagnosis not present

## 2017-10-13 DIAGNOSIS — T84099A Other mechanical complication of unspecified internal joint prosthesis, initial encounter: Secondary | ICD-10-CM | POA: Diagnosis not present

## 2017-10-13 DIAGNOSIS — G4730 Sleep apnea, unspecified: Secondary | ICD-10-CM | POA: Diagnosis present

## 2017-10-13 DIAGNOSIS — K3184 Gastroparesis: Secondary | ICD-10-CM | POA: Diagnosis not present

## 2017-10-13 DIAGNOSIS — Z888 Allergy status to other drugs, medicaments and biological substances status: Secondary | ICD-10-CM

## 2017-10-13 DIAGNOSIS — F17210 Nicotine dependence, cigarettes, uncomplicated: Secondary | ICD-10-CM | POA: Diagnosis present

## 2017-10-13 DIAGNOSIS — T84018A Broken internal joint prosthesis, other site, initial encounter: Secondary | ICD-10-CM

## 2017-10-13 DIAGNOSIS — Z7901 Long term (current) use of anticoagulants: Secondary | ICD-10-CM

## 2017-10-13 DIAGNOSIS — G2581 Restless legs syndrome: Secondary | ICD-10-CM | POA: Diagnosis present

## 2017-10-13 DIAGNOSIS — Z8541 Personal history of malignant neoplasm of cervix uteri: Secondary | ICD-10-CM | POA: Diagnosis not present

## 2017-10-13 DIAGNOSIS — I7300 Raynaud's syndrome without gangrene: Secondary | ICD-10-CM | POA: Diagnosis present

## 2017-10-13 DIAGNOSIS — Z96642 Presence of left artificial hip joint: Secondary | ICD-10-CM | POA: Diagnosis not present

## 2017-10-13 DIAGNOSIS — S72115A Nondisplaced fracture of greater trochanter of left femur, initial encounter for closed fracture: Secondary | ICD-10-CM | POA: Diagnosis present

## 2017-10-13 DIAGNOSIS — G40909 Epilepsy, unspecified, not intractable, without status epilepticus: Secondary | ICD-10-CM | POA: Diagnosis present

## 2017-10-13 DIAGNOSIS — I10 Essential (primary) hypertension: Secondary | ICD-10-CM | POA: Diagnosis present

## 2017-10-13 DIAGNOSIS — T84091A Other mechanical complication of internal left hip prosthesis, initial encounter: Principal | ICD-10-CM | POA: Diagnosis present

## 2017-10-13 DIAGNOSIS — Z471 Aftercare following joint replacement surgery: Secondary | ICD-10-CM | POA: Diagnosis not present

## 2017-10-13 DIAGNOSIS — Z96649 Presence of unspecified artificial hip joint: Secondary | ICD-10-CM

## 2017-10-13 DIAGNOSIS — Z7982 Long term (current) use of aspirin: Secondary | ICD-10-CM

## 2017-10-13 DIAGNOSIS — Z79899 Other long term (current) drug therapy: Secondary | ICD-10-CM

## 2017-10-13 DIAGNOSIS — Z6839 Body mass index (BMI) 39.0-39.9, adult: Secondary | ICD-10-CM

## 2017-10-13 DIAGNOSIS — Z419 Encounter for procedure for purposes other than remedying health state, unspecified: Secondary | ICD-10-CM

## 2017-10-13 DIAGNOSIS — Z882 Allergy status to sulfonamides status: Secondary | ICD-10-CM | POA: Diagnosis not present

## 2017-10-13 DIAGNOSIS — R7303 Prediabetes: Secondary | ICD-10-CM | POA: Diagnosis present

## 2017-10-13 DIAGNOSIS — R911 Solitary pulmonary nodule: Secondary | ICD-10-CM | POA: Diagnosis present

## 2017-10-13 DIAGNOSIS — D62 Acute posthemorrhagic anemia: Secondary | ICD-10-CM | POA: Diagnosis not present

## 2017-10-13 DIAGNOSIS — K219 Gastro-esophageal reflux disease without esophagitis: Secondary | ICD-10-CM | POA: Diagnosis present

## 2017-10-13 DIAGNOSIS — Z96651 Presence of right artificial knee joint: Secondary | ICD-10-CM | POA: Diagnosis present

## 2017-10-13 DIAGNOSIS — M84452A Pathological fracture, left femur, initial encounter for fracture: Secondary | ICD-10-CM | POA: Diagnosis not present

## 2017-10-13 DIAGNOSIS — Y792 Prosthetic and other implants, materials and accessory orthopedic devices associated with adverse incidents: Secondary | ICD-10-CM | POA: Diagnosis present

## 2017-10-13 DIAGNOSIS — F418 Other specified anxiety disorders: Secondary | ICD-10-CM | POA: Diagnosis not present

## 2017-10-13 DIAGNOSIS — J449 Chronic obstructive pulmonary disease, unspecified: Secondary | ICD-10-CM | POA: Diagnosis present

## 2017-10-13 DIAGNOSIS — Z9989 Dependence on other enabling machines and devices: Secondary | ICD-10-CM | POA: Diagnosis not present

## 2017-10-13 DIAGNOSIS — T84061A Wear of articular bearing surface of internal prosthetic left hip joint, initial encounter: Secondary | ICD-10-CM | POA: Diagnosis not present

## 2017-10-13 DIAGNOSIS — Z885 Allergy status to narcotic agent status: Secondary | ICD-10-CM | POA: Diagnosis not present

## 2017-10-13 DIAGNOSIS — M1612 Unilateral primary osteoarthritis, left hip: Secondary | ICD-10-CM | POA: Diagnosis present

## 2017-10-13 DIAGNOSIS — K635 Polyp of colon: Secondary | ICD-10-CM | POA: Diagnosis not present

## 2017-10-13 HISTORY — PX: TOTAL HIP REVISION: SHX763

## 2017-10-13 LAB — TYPE AND SCREEN
ABO/RH(D): O POS
Antibody Screen: NEGATIVE

## 2017-10-13 SURGERY — TOTAL HIP REVISION
Anesthesia: General | Site: Hip | Laterality: Left

## 2017-10-13 MED ORDER — PROPOFOL 10 MG/ML IV BOLUS
INTRAVENOUS | Status: AC
  Filled 2017-10-13: qty 60

## 2017-10-13 MED ORDER — FERROUS SULFATE 325 (65 FE) MG PO TABS
325.0000 mg | ORAL_TABLET | Freq: Every day | ORAL | Status: DC
  Administered 2017-10-14 – 2017-10-15 (×2): 325 mg via ORAL
  Filled 2017-10-13 (×2): qty 1

## 2017-10-13 MED ORDER — HYDROCHLOROTHIAZIDE 25 MG PO TABS
25.0000 mg | ORAL_TABLET | Freq: Every day | ORAL | Status: DC
  Administered 2017-10-14: 25 mg via ORAL
  Filled 2017-10-13: qty 1

## 2017-10-13 MED ORDER — HYDROCHLOROTHIAZIDE 25 MG PO TABS
25.0000 mg | ORAL_TABLET | Freq: Every day | ORAL | Status: DC
  Filled 2017-10-13: qty 1

## 2017-10-13 MED ORDER — KETAMINE HCL 10 MG/ML IJ SOLN
INTRAMUSCULAR | Status: DC | PRN
  Administered 2017-10-13 (×3): 10 mg via INTRAVENOUS

## 2017-10-13 MED ORDER — METHOCARBAMOL 500 MG PO TABS: 500.0000 mg | ORAL_TABLET | Freq: Four times a day (QID) | ORAL | 0 refills | Status: DC | PRN

## 2017-10-13 MED ORDER — ALUMINUM HYDROXIDE GEL 320 MG/5ML PO SUSP
15.0000 mL | ORAL | Status: DC | PRN
  Filled 2017-10-13: qty 30

## 2017-10-13 MED ORDER — KETAMINE HCL 10 MG/ML IJ SOLN
INTRAMUSCULAR | Status: AC
  Filled 2017-10-13: qty 1

## 2017-10-13 MED ORDER — ASPIRIN 81 MG PO CHEW
81.0000 mg | CHEWABLE_TABLET | Freq: Two times a day (BID) | ORAL | Status: DC
  Administered 2017-10-13 – 2017-10-15 (×4): 81 mg via ORAL
  Filled 2017-10-13 (×4): qty 1

## 2017-10-13 MED ORDER — METHOCARBAMOL 500 MG IVPB - SIMPLE MED
500.0000 mg | Freq: Four times a day (QID) | INTRAVENOUS | Status: DC | PRN
  Administered 2017-10-13: 500 mg via INTRAVENOUS
  Filled 2017-10-13: qty 50

## 2017-10-13 MED ORDER — DEXAMETHASONE SODIUM PHOSPHATE 10 MG/ML IJ SOLN
INTRAMUSCULAR | Status: DC | PRN
  Administered 2017-10-13: 10 mg via INTRAVENOUS

## 2017-10-13 MED ORDER — OXYCODONE HCL 5 MG PO TABS
5.0000 mg | ORAL_TABLET | ORAL | Status: DC | PRN
  Administered 2017-10-13 – 2017-10-14 (×3): 5 mg via ORAL
  Administered 2017-10-14: 10 mg via ORAL
  Administered 2017-10-15 (×2): 5 mg via ORAL
  Filled 2017-10-13 (×3): qty 1
  Filled 2017-10-13: qty 2
  Filled 2017-10-13 (×3): qty 1

## 2017-10-13 MED ORDER — ALBUMIN HUMAN 5 % IV SOLN
INTRAVENOUS | Status: AC
  Filled 2017-10-13: qty 250

## 2017-10-13 MED ORDER — PHENYLEPHRINE 40 MCG/ML (10ML) SYRINGE FOR IV PUSH (FOR BLOOD PRESSURE SUPPORT)
PREFILLED_SYRINGE | INTRAVENOUS | Status: AC
  Filled 2017-10-13: qty 10

## 2017-10-13 MED ORDER — MENTHOL 3 MG MT LOZG: 1.0000 | LOZENGE | OROMUCOSAL | Status: DC | PRN

## 2017-10-13 MED ORDER — ALBUMIN HUMAN 5 % IV SOLN
INTRAVENOUS | Status: DC | PRN
  Administered 2017-10-13 (×2): via INTRAVENOUS

## 2017-10-13 MED ORDER — SODIUM CHLORIDE 0.9 % IJ SOLN
INTRAMUSCULAR | Status: AC
  Filled 2017-10-13: qty 50

## 2017-10-13 MED ORDER — CHLORHEXIDINE GLUCONATE 4 % EX LIQD: 60.0000 mL | Freq: Once | CUTANEOUS | Status: DC

## 2017-10-13 MED ORDER — BUPROPION HCL ER (SMOKING DET) 150 MG PO TB12
150.0000 mg | ORAL_TABLET | Freq: Two times a day (BID) | ORAL | Status: DC
  Administered 2017-10-13 – 2017-10-15 (×4): 150 mg via ORAL
  Filled 2017-10-13 (×5): qty 1

## 2017-10-13 MED ORDER — CEFAZOLIN SODIUM-DEXTROSE 2-4 GM/100ML-% IV SOLN
2.0000 g | INTRAVENOUS | Status: AC
  Administered 2017-10-13: 2 g via INTRAVENOUS
  Filled 2017-10-13: qty 100

## 2017-10-13 MED ORDER — ROCURONIUM BROMIDE 10 MG/ML (PF) SYRINGE
PREFILLED_SYRINGE | INTRAVENOUS | Status: DC | PRN
  Administered 2017-10-13: 10 mg via INTRAVENOUS
  Administered 2017-10-13: 70 mg via INTRAVENOUS

## 2017-10-13 MED ORDER — PHENYLEPHRINE 40 MCG/ML (10ML) SYRINGE FOR IV PUSH (FOR BLOOD PRESSURE SUPPORT)
PREFILLED_SYRINGE | INTRAVENOUS | Status: DC | PRN
  Administered 2017-10-13: 40 ug via INTRAVENOUS
  Administered 2017-10-13: 80 ug via INTRAVENOUS

## 2017-10-13 MED ORDER — TRANEXAMIC ACID 1000 MG/10ML IV SOLN
1000.0000 mg | INTRAVENOUS | Status: AC
  Administered 2017-10-13: 1000 mg via INTRAVENOUS
  Filled 2017-10-13: qty 10

## 2017-10-13 MED ORDER — KCL IN DEXTROSE-NACL 20-5-0.45 MEQ/L-%-% IV SOLN
INTRAVENOUS | Status: DC
  Administered 2017-10-13 – 2017-10-14 (×3): via INTRAVENOUS
  Filled 2017-10-13 (×3): qty 1000

## 2017-10-13 MED ORDER — LOSARTAN POTASSIUM 50 MG PO TABS
100.0000 mg | ORAL_TABLET | Freq: Every day | ORAL | Status: DC
  Administered 2017-10-14: 100 mg via ORAL
  Filled 2017-10-13: qty 2

## 2017-10-13 MED ORDER — METOCLOPRAMIDE HCL 5 MG/ML IJ SOLN: 5.0000 mg | Freq: Three times a day (TID) | INTRAMUSCULAR | Status: DC | PRN

## 2017-10-13 MED ORDER — OXYCODONE-ACETAMINOPHEN 7.5-325 MG PO TABS: 1.0000 | ORAL_TABLET | ORAL | 0 refills | Status: DC | PRN

## 2017-10-13 MED ORDER — TRANEXAMIC ACID 1000 MG/10ML IV SOLN
1000.0000 mg | Freq: Once | INTRAVENOUS | Status: AC
  Administered 2017-10-13: 1000 mg via INTRAVENOUS
  Filled 2017-10-13: qty 1000

## 2017-10-13 MED ORDER — ONDANSETRON HCL 4 MG/2ML IJ SOLN
INTRAMUSCULAR | Status: DC | PRN
  Administered 2017-10-13: 4 mg via INTRAVENOUS

## 2017-10-13 MED ORDER — BUPIVACAINE-EPINEPHRINE (PF) 0.5% -1:200000 IJ SOLN
INTRAMUSCULAR | Status: AC
  Filled 2017-10-13: qty 30

## 2017-10-13 MED ORDER — HYDROMORPHONE HCL 1 MG/ML IJ SOLN: 0.5000 mg | INTRAMUSCULAR | Status: DC | PRN

## 2017-10-13 MED ORDER — BISACODYL 5 MG PO TBEC: 5.0000 mg | DELAYED_RELEASE_TABLET | Freq: Every day | ORAL | Status: DC | PRN

## 2017-10-13 MED ORDER — LOSARTAN POTASSIUM-HCTZ 100-25 MG PO TABS: 1.0000 | ORAL_TABLET | Freq: Every day | ORAL | Status: DC

## 2017-10-13 MED ORDER — ASPIRIN EC 81 MG PO TBEC: 81.0000 mg | DELAYED_RELEASE_TABLET | Freq: Two times a day (BID) | ORAL | 0 refills | Status: DC

## 2017-10-13 MED ORDER — LIFITEGRAST 5 % OP SOLN: 1.0000 [drp] | Freq: Two times a day (BID) | OPHTHALMIC | Status: DC | PRN

## 2017-10-13 MED ORDER — DEXAMETHASONE SODIUM PHOSPHATE 10 MG/ML IJ SOLN
10.0000 mg | Freq: Once | INTRAMUSCULAR | Status: AC
  Administered 2017-10-14: 10 mg via INTRAVENOUS
  Filled 2017-10-13: qty 1

## 2017-10-13 MED ORDER — LACTATED RINGERS IV SOLN
INTRAVENOUS | Status: DC
  Administered 2017-10-13: 09:00:00 via INTRAVENOUS
  Administered 2017-10-13: 1000 mL via INTRAVENOUS

## 2017-10-13 MED ORDER — METHOCARBAMOL 500 MG PO TABS
500.0000 mg | ORAL_TABLET | Freq: Four times a day (QID) | ORAL | Status: DC | PRN
  Administered 2017-10-13 – 2017-10-14 (×4): 500 mg via ORAL
  Filled 2017-10-13 (×4): qty 1

## 2017-10-13 MED ORDER — POLYETHYLENE GLYCOL 3350 17 G PO PACK: 17.0000 g | PACK | Freq: Every day | ORAL | Status: DC | PRN

## 2017-10-13 MED ORDER — FENTANYL CITRATE (PF) 100 MCG/2ML IJ SOLN
INTRAMUSCULAR | Status: DC | PRN
  Administered 2017-10-13: 100 ug via INTRAVENOUS

## 2017-10-13 MED ORDER — HYDROMORPHONE HCL 1 MG/ML IJ SOLN
INTRAMUSCULAR | Status: AC
  Filled 2017-10-13: qty 1

## 2017-10-13 MED ORDER — DOCUSATE SODIUM 100 MG PO CAPS
100.0000 mg | ORAL_CAPSULE | Freq: Two times a day (BID) | ORAL | Status: DC
  Administered 2017-10-13 – 2017-10-15 (×4): 100 mg via ORAL
  Filled 2017-10-13 (×4): qty 1

## 2017-10-13 MED ORDER — GABAPENTIN 300 MG PO CAPS
300.0000 mg | ORAL_CAPSULE | Freq: Three times a day (TID) | ORAL | Status: DC
  Filled 2017-10-13 (×2): qty 1

## 2017-10-13 MED ORDER — FLEET ENEMA 7-19 GM/118ML RE ENEM: 1.0000 | ENEMA | Freq: Once | RECTAL | Status: DC | PRN

## 2017-10-13 MED ORDER — NITROGLYCERIN 0.4 MG SL SUBL: 0.4000 mg | SUBLINGUAL_TABLET | SUBLINGUAL | Status: DC | PRN

## 2017-10-13 MED ORDER — ONDANSETRON HCL 4 MG PO TABS: 4.0000 mg | ORAL_TABLET | Freq: Four times a day (QID) | ORAL | Status: DC | PRN

## 2017-10-13 MED ORDER — BUPIVACAINE-EPINEPHRINE (PF) 0.25% -1:200000 IJ SOLN
INTRAMUSCULAR | Status: DC | PRN
  Administered 2017-10-13: 30 mL

## 2017-10-13 MED ORDER — PANTOPRAZOLE SODIUM 40 MG PO TBEC
40.0000 mg | DELAYED_RELEASE_TABLET | Freq: Every day | ORAL | Status: DC
  Administered 2017-10-14 – 2017-10-15 (×2): 40 mg via ORAL
  Filled 2017-10-13 (×2): qty 1

## 2017-10-13 MED ORDER — BUPIVACAINE-EPINEPHRINE (PF) 0.25% -1:200000 IJ SOLN
INTRAMUSCULAR | Status: AC
  Filled 2017-10-13: qty 30

## 2017-10-13 MED ORDER — SUGAMMADEX SODIUM 200 MG/2ML IV SOLN
INTRAVENOUS | Status: AC
  Filled 2017-10-13: qty 2

## 2017-10-13 MED ORDER — PROMETHAZINE HCL 25 MG/ML IJ SOLN: 6.2500 mg | INTRAMUSCULAR | Status: DC | PRN

## 2017-10-13 MED ORDER — SODIUM CHLORIDE 0.9% FLUSH
INTRAVENOUS | Status: DC | PRN
  Administered 2017-10-13: 20 mL

## 2017-10-13 MED ORDER — HYDROMORPHONE HCL 1 MG/ML IJ SOLN
0.2500 mg | INTRAMUSCULAR | Status: DC | PRN
  Administered 2017-10-13 (×3): 0.5 mg via INTRAVENOUS

## 2017-10-13 MED ORDER — METHOCARBAMOL 500 MG IVPB - SIMPLE MED
INTRAVENOUS | Status: AC
  Filled 2017-10-13: qty 50

## 2017-10-13 MED ORDER — 0.9 % SODIUM CHLORIDE (POUR BTL) OPTIME
TOPICAL | Status: DC | PRN
  Administered 2017-10-13: 1000 mL

## 2017-10-13 MED ORDER — CLOPIDOGREL BISULFATE 75 MG PO TABS
75.0000 mg | ORAL_TABLET | Freq: Every day | ORAL | Status: DC
  Administered 2017-10-14 – 2017-10-15 (×2): 75 mg via ORAL
  Filled 2017-10-13 (×2): qty 1

## 2017-10-13 MED ORDER — ONDANSETRON HCL 4 MG/2ML IJ SOLN: 4.0000 mg | Freq: Four times a day (QID) | INTRAMUSCULAR | Status: DC | PRN

## 2017-10-13 MED ORDER — ACETAMINOPHEN 325 MG PO TABS: 325.0000 mg | ORAL_TABLET | Freq: Four times a day (QID) | ORAL | Status: DC | PRN

## 2017-10-13 MED ORDER — ROPINIROLE HCL 1 MG PO TABS
1.0000 mg | ORAL_TABLET | Freq: Three times a day (TID) | ORAL | Status: DC
  Administered 2017-10-13 – 2017-10-15 (×6): 1 mg via ORAL
  Filled 2017-10-13 (×6): qty 1

## 2017-10-13 MED ORDER — ALBUTEROL SULFATE (2.5 MG/3ML) 0.083% IN NEBU: 3.0000 mL | INHALATION_SOLUTION | RESPIRATORY_TRACT | Status: DC | PRN

## 2017-10-13 MED ORDER — CALCIUM CARBONATE ANTACID 500 MG PO CHEW: 2.0000 | CHEWABLE_TABLET | Freq: Four times a day (QID) | ORAL | Status: DC | PRN

## 2017-10-13 MED ORDER — STERILE WATER FOR IRRIGATION IR SOLN
Status: DC | PRN
  Administered 2017-10-13: 2000 mL

## 2017-10-13 MED ORDER — BUPIVACAINE LIPOSOME 1.3 % IJ SUSP
INTRAMUSCULAR | Status: DC | PRN
  Administered 2017-10-13: 20 mL

## 2017-10-13 MED ORDER — SUGAMMADEX SODIUM 200 MG/2ML IV SOLN
INTRAVENOUS | Status: DC | PRN
  Administered 2017-10-13: 200 mg via INTRAVENOUS

## 2017-10-13 MED ORDER — PHENOL 1.4 % MT LIQD
1.0000 | OROMUCOSAL | Status: DC | PRN
  Filled 2017-10-13: qty 177

## 2017-10-13 MED ORDER — METOCLOPRAMIDE HCL 5 MG PO TABS: 5.0000 mg | ORAL_TABLET | Freq: Three times a day (TID) | ORAL | Status: DC | PRN

## 2017-10-13 MED ORDER — PROPOFOL 10 MG/ML IV BOLUS
INTRAVENOUS | Status: DC | PRN
  Administered 2017-10-13: 170 mg via INTRAVENOUS

## 2017-10-13 MED ORDER — HYDROMORPHONE HCL 2 MG/ML IJ SOLN
INTRAMUSCULAR | Status: AC
  Filled 2017-10-13: qty 1

## 2017-10-13 MED ORDER — LIDOCAINE 2% (20 MG/ML) 5 ML SYRINGE
INTRAMUSCULAR | Status: DC | PRN
  Administered 2017-10-13: 100 mg via INTRAVENOUS

## 2017-10-13 MED ORDER — BISOPROLOL FUMARATE 5 MG PO TABS
10.0000 mg | ORAL_TABLET | Freq: Every day | ORAL | Status: DC
  Administered 2017-10-14: 10 mg via ORAL
  Filled 2017-10-13: qty 2

## 2017-10-13 MED ORDER — ACETAMINOPHEN 500 MG PO TABS
1000.0000 mg | ORAL_TABLET | Freq: Four times a day (QID) | ORAL | Status: AC
  Administered 2017-10-13 – 2017-10-14 (×4): 1000 mg via ORAL
  Filled 2017-10-13 (×4): qty 2

## 2017-10-13 MED ORDER — TRANEXAMIC ACID 1000 MG/10ML IV SOLN
INTRAVENOUS | Status: DC | PRN
  Administered 2017-10-13: 2000 mg via TOPICAL

## 2017-10-13 MED ORDER — FENTANYL CITRATE (PF) 100 MCG/2ML IJ SOLN
INTRAMUSCULAR | Status: AC
  Filled 2017-10-13: qty 2

## 2017-10-13 MED ORDER — DULOXETINE HCL 60 MG PO CPEP
60.0000 mg | ORAL_CAPSULE | Freq: Every day | ORAL | Status: DC
  Administered 2017-10-14 – 2017-10-15 (×2): 60 mg via ORAL
  Filled 2017-10-13 (×2): qty 1

## 2017-10-13 MED ORDER — DIPHENHYDRAMINE HCL 12.5 MG/5ML PO ELIX: 12.5000 mg | ORAL_SOLUTION | ORAL | Status: DC | PRN

## 2017-10-13 MED ORDER — HYDROMORPHONE HCL 1 MG/ML IJ SOLN
INTRAMUSCULAR | Status: DC | PRN
  Administered 2017-10-13 (×2): 0.5 mg via INTRAVENOUS

## 2017-10-13 SURGICAL SUPPLY — 67 items
BALL HIP ARTICU 28 +5 (Hips) IMPLANT
BIT DRILL 2.8 QUICK RELEASE (BIT) ×1 IMPLANT
BIT DRILL RINGLOC QUICK CONN (BIT) IMPLANT
BLADE SAW SAG 73X25 THK (BLADE)
BLADE SAW SGTL 73X25 THK (BLADE) ×1 IMPLANT
BONE CHIP PRESERV 30CC PCAN30 (Bone Implant) ×3 IMPLANT
BRNG HIP 44X28 2 MBL STRL (Liner) ×1 IMPLANT
COVER SURGICAL LIGHT HANDLE (MISCELLANEOUS) ×3 IMPLANT
DRAPE C-ARM 42X120 X-RAY (DRAPES) ×3 IMPLANT
DRAPE C-ARMOR (DRAPES) ×3 IMPLANT
DRAPE ORTHO SPLIT 77X108 STRL (DRAPES) ×3
DRAPE SHEET LG 3/4 BI-LAMINATE (DRAPES) ×6 IMPLANT
DRAPE SURG ORHT 6 SPLT 77X108 (DRAPES) ×1 IMPLANT
DRAPE U-SHAPE 47X51 STRL (DRAPES) ×3 IMPLANT
DRILL 2.8 QUICK RELEASE (BIT) ×3
DRILL BIT RINGLOC QUICK CONN (BIT) ×3
DRSG AQUACEL AG ADV 3.5X10 (GAUZE/BANDAGES/DRESSINGS) ×3 IMPLANT
ELECT BLADE TIP CTD 4 INCH (ELECTRODE) ×3 IMPLANT
ELECT REM PT RETURN 15FT ADLT (MISCELLANEOUS) ×3 IMPLANT
FACESHIELD WRAPAROUND (MASK) ×12 IMPLANT
FACESHIELD WRAPAROUND OR TEAM (MASK) ×4 IMPLANT
GAUZE SPONGE 4X4 12PLY STRL (GAUZE/BANDAGES/DRESSINGS) IMPLANT
GAUZE XEROFORM 5X9 LF (GAUZE/BANDAGES/DRESSINGS) IMPLANT
GLOVE BIO SURGEON STRL SZ7.5 (GLOVE) ×3 IMPLANT
GLOVE BIO SURGEON STRL SZ8 (GLOVE) ×3 IMPLANT
GLOVE BIOGEL PI IND STRL 8 (GLOVE) ×1 IMPLANT
GLOVE BIOGEL PI IND STRL 9 (GLOVE) ×1 IMPLANT
GLOVE BIOGEL PI INDICATOR 8 (GLOVE) ×2
GLOVE BIOGEL PI INDICATOR 9 (GLOVE) ×2
GRAFT BNE CANC CHIPS 1-8 30CC (Bone Implant) IMPLANT
HIP BALL ARTICU 28 +5 (Hips) ×3 IMPLANT
IMMOBILIZER KNEE 20 (SOFTGOODS) ×3
IMMOBILIZER KNEE 20 THIGH 36 (SOFTGOODS) ×1 IMPLANT
IV NS IRRIG 3000ML ARTHROMATIC (IV SOLUTION) ×3 IMPLANT
KIT BASIN OR (CUSTOM PROCEDURE TRAY) ×3 IMPLANT
LINER ACETAB BEARING 28X44 SZF (Liner) ×2 IMPLANT
LINER ACETAB G7 44MM SZF (Liner) ×2 IMPLANT
NDL MAYO CATGUT SZ4 TPR NDL (NEEDLE) ×1 IMPLANT
NEEDLE HYPO 22GX1.5 SAFETY (NEEDLE) ×6 IMPLANT
NEEDLE MAYO CATGUT SZ4 (NEEDLE) ×3 IMPLANT
NS IRRIG 1000ML POUR BTL (IV SOLUTION) ×3 IMPLANT
PACK TOTAL JOINT (CUSTOM PROCEDURE TRAY) ×3 IMPLANT
PASSER SUT SWANSON 36MM LOOP (INSTRUMENTS) ×3 IMPLANT
POSITIONER SURGICAL ARM (MISCELLANEOUS) ×3 IMPLANT
SCREW ACETABULAR G7 6.5X30 (Screw) ×2 IMPLANT
SHELL ACETAB MULTIHOLE 56MM S7 (Hips) ×2 IMPLANT
SPONGE LAP 18X18 RF (DISPOSABLE) IMPLANT
STAPLER VISISTAT 35W (STAPLE) ×3 IMPLANT
SUCTION FRAZIER 12FR DISP (SUCTIONS) ×3 IMPLANT
SUT ETHIBOND 2 V 37 (SUTURE) ×4 IMPLANT
SUT VIC AB 0 CT2 27 (SUTURE) ×6 IMPLANT
SUT VIC AB 1 CT1 36 (SUTURE) ×3 IMPLANT
SUT VIC AB 1 CTX 36 (SUTURE) ×3
SUT VIC AB 1 CTX36XBRD ANBCTR (SUTURE) ×1 IMPLANT
SUT VIC AB 2-0 CT1 27 (SUTURE) ×6
SUT VIC AB 2-0 CT1 TAPERPNT 27 (SUTURE) ×2 IMPLANT
SUT VIC AB 3-0 CT1 27 (SUTURE) ×3
SUT VIC AB 3-0 CT1 TAPERPNT 27 (SUTURE) ×1 IMPLANT
SWAB COLLECTION DEVICE MRSA (MISCELLANEOUS) ×3 IMPLANT
SWAB CULTURE ESWAB REG 1ML (MISCELLANEOUS) ×3 IMPLANT
SYR CONTROL 10ML LL (SYRINGE) ×6 IMPLANT
TOWEL OR 17X26 10 PK STRL BLUE (TOWEL DISPOSABLE) ×3 IMPLANT
TOWEL OR NON WOVEN STRL DISP B (DISPOSABLE) ×3 IMPLANT
TOWER CARTRIDGE SMART MIX (DISPOSABLE) IMPLANT
TRAY FOLEY CATH 16FR SILVER (SET/KITS/TRAYS/PACK) ×2 IMPLANT
WATER STERILE IRR 1000ML POUR (IV SOLUTION) ×6 IMPLANT
YANKAUER SUCT BULB TIP 10FT TU (MISCELLANEOUS) ×3 IMPLANT

## 2017-10-13 NOTE — Discharge Instructions (Signed)

## 2017-10-13 NOTE — Interval H&P Note (Signed)
History and Physical Interval Note:  10/13/2017 7:09 AM  [name]  has presented today for surgery, with the diagnosis of LEFT TOTAL HIP ARTHROPLASTY POLY WEAR WITH GREATER TROCHANTERIC FRACTURE  The various methods of treatment have been discussed with the patient and family. After consideration of risks, benefits and other options for treatment, the patient has consented to  Procedure(s): LEFT TOTAL HIP REVISION ACETABULUM, OPEN REDUCTION INTERNAL FIXATION LEFT GREATER TROCHANTERIC FRACTURE (Left) as a surgical intervention .  The patient's history has been reviewed, patient examined, no change in status, stable for surgery.  I have reviewed the patient's chart and labs.  Questions were answered to the patient's satisfaction.     Kerin Salen

## 2017-10-13 NOTE — Anesthesia Preprocedure Evaluation (Addendum)
Anesthesia Evaluation  Patient identified by MRN, date of birth, ID band Patient awake    Reviewed: Allergy & Precautions, NPO status , Patient's Chart, lab work & pertinent test results  Airway Mallampati: II  TM Distance: >3 FB Neck ROM: Full    Dental no notable dental hx.    Pulmonary sleep apnea , COPD, Current Smoker,    Pulmonary exam normal breath sounds clear to auscultation       Cardiovascular hypertension, Normal cardiovascular exam Rhythm:Regular Rate:Normal     Neuro/Psych CVA, Residual Symptoms negative psych ROS   GI/Hepatic negative GI ROS, Neg liver ROS,   Endo/Other  Morbid obesity  Renal/GU negative Renal ROS  negative genitourinary   Musculoskeletal negative musculoskeletal ROS (+)   Abdominal   Peds negative pediatric ROS (+)  Hematology negative hematology ROS (+)   Anesthesia Other Findings   Reproductive/Obstetrics negative OB ROS                             Anesthesia Physical Anesthesia Plan  ASA: III  Anesthesia Plan: General   Post-op Pain Management:    Induction: Intravenous  PONV Risk Score and Plan: 2 and Ondansetron, Dexamethasone and Treatment may vary due to age or medical condition  Airway Management Planned: Oral ETT  Additional Equipment:   Intra-op Plan:   Post-operative Plan: Extubation in OR  Informed Consent: I have reviewed the patients History and Physical, chart, labs and discussed the procedure including the risks, benefits and alternatives for the proposed anesthesia with the patient or authorized representative who has indicated his/her understanding and acceptance.   Dental advisory given  Plan Discussed with: CRNA and Surgeon  Anesthesia Plan Comments:        Anesthesia Quick Evaluation

## 2017-10-13 NOTE — Plan of Care (Signed)
Plan of care 

## 2017-10-13 NOTE — Op Note (Signed)
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  Primary implants Depew Duraloc to go to cup polyethylene shell and 5/8 coated AML stem  Postoperative diagnosis: Same  Procedure: Revision left total hip arthroplasty acetabular component with removal of the go to shell, placement of Zimmer 56 mm MDM micro-structure titanium shell, dual mobility acetabular liner, 44 mm dual mobility polyethylene bearing for 28 mm head and then +5 Depew 28 mm head on the AML stem.  We also curetted out and bone grafted the nondisplaced greater trochanteric fracture  Surgeon: Kathalene Frames. Mayer Camel M.D.  Assistant: Kerry Hough. Barton Dubois  (present throughout entire procedure and necessary for timely completion of the procedure)  Estimated blood loss: 500 cc  Fluid replacement: 2 L cc of crystalloid  Anesthesia: General endotracheal, Exparel and Marcaine local.  Trans-Amick acid: 1 g IV perioperatively, 2 g topical Intra-Op  Drains: Foley catheter output 166 cc  Complications: None  Indications: 67 year old female underwent primary left total hip arthroplasty near 2006 by a physician who is now retired.  Primary implants were a 52 mm Depew tri-spiked to go to shell 10 degree polyethylene liner +1 x 28 mm metal head and an AML stem.  Over the last year she is developed pain over the greater trochanter CT scan shows ostial lysis into the greater trochanter with some small nondisplaced fractures as well as severe polyethylene wear of the liner inside the Duraloc shell which is actually retroverted by CT scan about 20 degrees.  Because of pain and disability she desires elective revision will probably have to remove the shell and place a larger dual mobility shell for stability with the appropriate anteversion.  The plan is to also inspect and probably bone graft the greater trochanter.  Risks and benefits of revision surgery have been discussed  and questions answered.  Procedure: Patient was identified by arm band receive preoperative IV antibiotics in the holding area at, and hospital. He was then taken to the operating room where the appropriate anesthetic monitors were attached and general endotracheal anesthesia induced with the patient in the supine position.  Patient was then rolled into the right lateral decubitus position on the flat Jackson radiolucent table and fixed there with a marked to pelvic clamp.  The left lower extremity was then prepped and draped in usual sterile fashion from the ankle to the hemipelvis and a timeout procedure was performed.  The skin along the old posterior lateral approach was infiltrated with a mixture of Exparel and Marcaine and we began the procedure by re-creating the old incision 18 cm in length centered over the greater trochanter through the skin and subcutaneous tissue down to the IT band which was cut in line with the skin incision.  Small bleeders were identified and cauterized with electrocautery.  This exposed the greater trochanter and the posterior pseudocapsule which was taken down with electrocautery entering the joint where we could clearly see the polyethylene wear.  Scar tissue was removed superior posteriorly inferiorly and anteriorly allowing [place] to dislocate the hip and remove the 28 mm femoral head with a metal bone tamp.  We then dissected superiorly and anteriorly allowing [place] to talk the trunnion above the shell we inspected the polyethylene liner which should have severe wear.  After clearing the rim of the liner using osteotomes and curettes the liner was removed with the Depew liner removing tool as well as the locking ring.  The  cup was impressively retroverted and a decision at this time was made to remove the shell.  We used a short and long blade curved osteotomes from [employer] and after getting all the bone except the bone it was around the try spikes we then inserted the handle on the  cup and gently rocked the shell out removing minimal bone.  The acetabulum was in relatively good shape there was some bone missing anteriorly but it was felt we could put a larger MDM shell in.  We then gently and sequentially reamed up to 55 mm obtaining good coverage superiorly posteriorly and inferiorly and even to some extent anteriorly.  We then selected a 56 mm G7 osteo-tie acetabular shell multihole loaded on the inserter and hammered it into place and 45 degrees abduction and 30 degrees of anteversion.  The shell actually settled nicely and was not loose after impaction.  We went ahead and put in one superior dome screw for additional fixation.  We then placed the MDM metal shell with the inserter and trialed with a +1.5 and +5 MDM trials.  Stability was noted in 90 of flexion 80 of internal rotation and the hip could not be dislocated anteriorly.  Trials were removed we then directed our attention to the greater trochanter and made a small window through the sub-tissue into the area where the ostial lysis was we then removed the fibrous tissue with curettes and pack to the greater trochanter with crushed cancellus bone 15 g.  We then pressed into place a +5 28 mm head into the 44 MDM polyethylene bearing, and the construct was hammered onto the AML stem the hip reduced and stability once again checked.  We then injected Exparel in the periarticular tissues irrigated out with normal saline solution repair the pseudocapsule back to the intertrochanteric crest with #1 Ethibond sutures through drill holes close the IT band with #1 Vicryl suture and the subcutaneous tissue was zero 2-0 and 3-0 Vicryl suture.  Aquacil dressing was applied the patient was unclamped rolled supine awaken extubated and taken to the recovery room without difficulty.

## 2017-10-13 NOTE — Anesthesia Procedure Notes (Signed)
Procedure Name: Intubation Date/Time: 10/13/2017 7:33 AM Performed by: Lavina Hamman, CRNA Pre-anesthesia Checklist: Patient identified, Emergency Drugs available, Suction available, Patient being monitored and Timeout performed Patient Re-evaluated:Patient Re-evaluated prior to induction Oxygen Delivery Method: Circle system utilized Preoxygenation: Pre-oxygenation with 100% oxygen Induction Type: IV induction Ventilation: Mask ventilation without difficulty and Oral airway inserted - appropriate to patient size Laryngoscope Size: Mac and 3 Grade View: Grade I Tube type: Oral Tube size: 7.0 mm Number of attempts: 1 Airway Equipment and Method: Stylet Placement Confirmation: ETT inserted through vocal cords under direct vision,  positive ETCO2,  CO2 detector and breath sounds checked- equal and bilateral Secured at: 21 cm Tube secured with: Tape Dental Injury: Teeth and Oropharynx as per pre-operative assessment

## 2017-10-13 NOTE — Anesthesia Postprocedure Evaluation (Signed)
Anesthesia Post Note  Patient: [name]  Procedure(s) Performed: LEFT TOTAL HIP REVISION ACETABULUM, OPEN REDUCTION INTERNAL WITH BONE GRAFT FIXATION LEFT GREATER TROCHANTERIC FRACTURE (Left Hip)     Patient location during evaluation: PACU Anesthesia Type: General Level of consciousness: awake and alert Pain management: pain level controlled Vital Signs Assessment: post-procedure vital signs reviewed and stable Respiratory status: spontaneous breathing, nonlabored ventilation, respiratory function stable and patient connected to nasal cannula oxygen Cardiovascular status: blood pressure returned to baseline and stable Postop Assessment: no apparent nausea or vomiting Anesthetic complications: no    Last Vitals:  Vitals:   10/13/17 1130 10/13/17 1145  BP: 122/70   Pulse: 95 96  Resp: 15 13  Temp: 36.4 C   SpO2: 99% 98%    Last Pain:  Vitals:   10/13/17 1130  TempSrc:   PainSc: 3                  Clara Smolen S

## 2017-10-13 NOTE — Transfer of Care (Signed)
Immediate Anesthesia Transfer of Care Note  Patient: [name]  Procedure(s) Performed: Procedure(s): LEFT TOTAL HIP REVISION ACETABULUM, OPEN REDUCTION INTERNAL WITH BONE GRAFT FIXATION LEFT GREATER TROCHANTERIC FRACTURE (Left)  Patient Location: PACU  Anesthesia Type:General  Level of Consciousness:  sedated, patient cooperative and responds to stimulation  Airway & Oxygen Therapy:Patient Spontanous Breathing and Patient connected to face mask oxgen  Post-op Assessment:  Report given to PACU RN and Post -op Vital signs reviewed and stable  Post vital signs:  Reviewed and stable  Last Vitals:  Vitals:   10/13/17 0604  BP: 133/63  Pulse: 91  Resp: 18  Temp: 36.7 C  SpO2: 82%    Complications: No apparent anesthesia complications

## 2017-10-13 NOTE — Progress Notes (Signed)
Mild periorbital edema and moderate bruising noted below eyes bilaterally.  Patient states this is due to recent eye surgery. Not requiring any treatment to area presently.  Also has small skin tear noted on left forearm with small amount bruising around area.  Area covered with gauze and tegaderm.  No new drainage noted.

## 2017-10-14 ENCOUNTER — Encounter (HOSPITAL_COMMUNITY): Payer: Self-pay | Admitting: Orthopedic Surgery

## 2017-10-14 LAB — BASIC METABOLIC PANEL
ANION GAP: 5 (ref 5–15)
BUN: 13 mg/dL (ref 8–23)
CO2: 29 mmol/L (ref 22–32)
CREATININE: 0.76 mg/dL (ref 0.44–1.00)
Calcium: 8.6 mg/dL — ABNORMAL LOW (ref 8.9–10.3)
Chloride: 104 mmol/L (ref 98–111)
GFR calc non Af Amer: >60 mL/min (ref >60)
Glucose, Bld: 158 mg/dL — ABNORMAL HIGH (ref 70–99)
Potassium: 4.1 mmol/L (ref 3.5–5.1)
SODIUM: 138 mmol/L (ref 135–145)

## 2017-10-14 LAB — CBC
HEMATOCRIT: 28.4 % — AB (ref 36.0–46.0)
Hemoglobin: 9.2 g/dL — ABNORMAL LOW (ref 12.0–15.0)
MCH: 29.3 pg (ref 26.0–34.0)
MCHC: 32.4 g/dL (ref 30.0–36.0)
MCV: 90.4 fL (ref 78.0–100.0)
PLATELETS: 175 10*3/uL (ref 150–400)
RBC: 3.14 MIL/uL — AB (ref 3.87–5.11)
RDW: 15.5 % (ref 11.5–15.5)
WBC: 6.6 10*3/uL (ref 4.0–10.5)

## 2017-10-14 MED ORDER — NICOTINE 14 MG/24HR TD PT24
14.0000 mg | MEDICATED_PATCH | Freq: Every day | TRANSDERMAL | Status: DC
  Administered 2017-10-14 – 2017-10-15 (×2): 14 mg via TRANSDERMAL
  Filled 2017-10-14 (×2): qty 1

## 2017-10-14 NOTE — Progress Notes (Signed)
PATIENT ID: [name]  MRN: [id number]  DOB/AGE:  18-Jun-1950 / 67 y.o.  1 Day Post-Op Procedure(s) (LRB): LEFT TOTAL HIP REVISION ACETABULUM, OPEN REDUCTION INTERNAL WITH BONE GRAFT FIXATION LEFT GREATER TROCHANTERIC FRACTURE (Left)    PROGRESS NOTE Subjective: Patient is alert, oriented, no Nausea, no Vomiting, yes passing gas, . Taking PO well. Denies SOB, Chest or Calf Pain. Using Incentive Spirometer, PAS in place. Ambulate WBAT Patient reports pain as  3/10  .    Objective: Vital signs in last 24 hours: Vitals:   10/13/17 2200 10/13/17 2205 10/14/17 0129 10/14/17 0522  BP:   122/66 111/66  Pulse:   94 96  Resp:   16 16  Temp:   98.6 F (37 C) 97.9 F (36.6 C)  TempSrc:   Oral Oral  SpO2: (Abnormal) 88% 97% 99% 98%  Weight:      Height:          Intake/Output from previous day: I/O last 3 completed shifts: In: 4986.9 [P.O.:660; I.V.:3726.9; IV [id number]:574] Out: 3250 [Urine:2550; Blood:700]   Intake/Output this shift: No intake/output data recorded.   LABORATORY DATA: Recent Labs    10/14/17 0521  WBC 6.6  HGB 9.2*  HCT 28.4*  PLT 175  NA 138  K 4.1  CL 104  CO2 29  BUN 13  CREATININE 0.76  GLUCOSE 158*  CALCIUM 8.6*    Examination: Neurologically intact ABD soft Neurovascular intact Sensation intact distally Intact pulses distally Dorsiflexion/Plantar flexion intact Incision: scant drainage No cellulitis present Compartment soft} XR AP&Lat of hip shows well placed\fixed THA  Assessment:   1 Day Post-Op Procedure(s) (LRB): LEFT TOTAL HIP REVISION ACETABULUM, OPEN REDUCTION INTERNAL WITH BONE GRAFT FIXATION LEFT GREATER TROCHANTERIC FRACTURE (Left) ADDITIONAL DIAGNOSIS:  Expected Acute Blood Loss Anemia, obesity, Raynauds, tobacco abuse, e3pilepsy  Plan: PT/OT WBAT, THA  DVT Prophylaxis: SCDx72 hrs, ASA 81 mg BID x 2 weeks  DISCHARGE PLAN: Home, today if passes PT  DISCHARGE NEEDS: HHPT, Walker and 3-in-1 comode seat

## 2017-10-14 NOTE — Evaluation (Signed)
Physical Therapy Evaluation Patient Details Name: [name] MRN: [id number] DOB: Sep 16, 1950 Today's Date: 10/14/2017   History of Present Illness  Pt is a 67 year old female s/p LEFT TOTAL HIP REVISION ACETABULUM, OPEN REDUCTION INTERNAL WITH BONE GRAFT FIXATION LEFT GREATER TROCHANTERIC FRACTURE (Left).  PMHx of peripheral neuropathy, L THA (over 10 year ago per pt), R TKA, and recent BLEPHAROPLASTY 10/10/17  Clinical Impression  Pt is s/p THA resulting in the deficits listed below (see PT Problem List).  Pt will benefit from skilled PT to increase their independence and safety with mobility to allow discharge to the venue listed below.  Pt mobilizing slowly and educated on posterior hip precautions.  Pt performed LE exercises in recliner and educated on maintaining precautions while performing exercises.  Pt reports stiffness and pain limiting mobility.  Pt will need to practice steps prior to d/c.     Follow Up Recommendations Follow surgeon's recommendation for DC plan and follow-up therapies    Equipment Recommendations  None recommended by PT    Recommendations for Other Services       Precautions / Restrictions Precautions Precautions: Fall;Posterior Hip Precaution Comments: pt unable to recall posterior hip precautions; demonstrated and reviewed posterior hip precautions Restrictions Other Position/Activity Restrictions: WBAT      Mobility  Bed Mobility               General bed mobility comments: pt up in recliner on arrival  Transfers Overall transfer level: Needs assistance Equipment used: Rolling walker (2 wheeled) Transfers: Sit to/from Stand Sit to Stand: Min assist         General transfer comment: verbal cues for safe technique and maintaining precautions  Ambulation/Gait Ambulation/Gait assistance: Min assist Gait Distance (Feet): 50 Feet Assistive device: Rolling walker (2 wheeled) Gait Pattern/deviations: Step-to pattern;Decreased stance  time - left;Antalgic     General Gait Details: verbal cues for sequence, RW positioning, step length  Stairs            Wheelchair Mobility    Modified Rankin (Stroke Patients Only)       Balance                                             Pertinent Vitals/Pain Pain Assessment: 0-10 Pain Score: 5  Pain Location: L hip Pain Descriptors / Indicators: Aching;Sore Pain Intervention(s): Limited activity within patient's tolerance;Repositioned;Monitored during session    [place] expects to be discharged to:: Private residence Living Arrangements: Spouse/significant other Available Help at Discharge: Family;Available PRN/intermittently Type of Home: House Home Access: Stairs to enter Entrance Stairs-Rails: None Entrance Stairs-Number of Steps: 2 Home Layout: One level Home Equipment: Walker - 2 wheels      Prior Function Level of Independence: Independent               Hand Dominance        Extremity/Trunk Assessment        Lower Extremity Assessment Lower Extremity Assessment: LLE deficits/detail LLE Deficits / Details: anticipated post op hip weakness, fair quad contraction, able to perform ankle pumps, maintained posterior hip precautions       Communication   Communication: No difficulties  Cognition Arousal/Alertness: Awake/alert Behavior During Therapy: WFL for tasks assessed/performed Overall Cognitive Status: Within Functional Limits for tasks assessed  General Comments      Exercises Total Joint Exercises Ankle Circles/Pumps: AROM;10 reps;Both Quad Sets: AROM;10 reps;Left Short Arc Quad: AROM;10 reps;Left Heel Slides: AAROM;10 reps;Left(within precautions) Hip ABduction/ADduction: AAROM;10 reps;Left Long Arc Quad: AROM;10 reps;Left;Seated   Assessment/Plan    PT Assessment Patient needs continued PT services  PT Problem List Decreased  mobility;Decreased strength;Decreased activity tolerance;Pain;Decreased knowledge of use of DME;Decreased knowledge of precautions       PT Treatment Interventions Stair training;Gait training;Patient/family education;Therapeutic exercise;Therapeutic activities;DME instruction;Functional mobility training    PT Goals (Current goals can be found in the Care Plan section)  Acute Rehab PT Goals PT Goal Formulation: With patient Time For Goal Achievement: 10/18/17 Potential to Achieve Goals: Good    Frequency 7X/week   Barriers to discharge        Co-evaluation               AM-PAC PT "6 Clicks" Daily Activity  Outcome Measure Difficulty turning over in bed (including adjusting bedclothes, sheets and blankets)?: None Difficulty moving from lying on back to sitting on the side of the bed? : A Lot Difficulty sitting down on and standing up from a chair with arms (e.g., wheelchair, bedside commode, etc,.)?: Unable Help needed moving to and from a bed to chair (including a wheelchair)?: A Little Help needed walking in hospital room?: A Little Help needed climbing 3-5 steps with a railing? : A Lot 6 Click Score: 15    End of Session Equipment Utilized During Treatment: Gait belt Activity Tolerance: Patient tolerated treatment well Patient left: in chair;with call bell/phone within reach Nurse Communication: Mobility status PT Visit Diagnosis: Other abnormalities of gait and mobility (R26.89)    Time: 4665-9935 PT Time Calculation (min) (ACUTE ONLY): 24 min   Charges:   PT Evaluation $PT Eval Low Complexity: 1 Low PT Treatments $Therapeutic Exercise: 8-22 mins       Carmelia Bake, PT, DPT Acute Rehabilitation Services Office: [phone] Pager: [phone]   Trena Platt 10/14/2017, 10:53 AM

## 2017-10-14 NOTE — Care Management Note (Signed)
Case Management Note  Patient Details  Name: [name] MRN: [id number] Date of Birth: [birth year]  Subjective/Objective:    Discharge planning,HH prearranged with [facility] for [facility] PT, evaluate and treat.   Action/Plan: Contacted [facility] for referral. Has DME. [phone]                Expected Discharge Date:                  Expected Discharge Plan:  [place]  In-House Referral:  NA  Discharge planning Services  CM Consult  Post Acute Care Choice:  Home Health Choice offered to:  Patient  DME Arranged:  N/A DME Agency:  NA  HH Arranged:  PT HH Agency:  [facility]  Status of Service:  Completed, signed off  If discussed at [place] of Stay Meetings, dates discussed:    Additional Comments:  Guadalupe Maple, RN 10/14/2017, 1:14 PM

## 2017-10-14 NOTE — Progress Notes (Signed)
Physical Therapy Treatment Patient Details Name: [name] MRN: [id number] DOB: 1950-07-24 Today's Date: 10/14/2017    History of Present Illness Pt is a 67 year old female s/p LEFT TOTAL HIP REVISION ACETABULUM, OPEN REDUCTION INTERNAL WITH BONE GRAFT FIXATION LEFT GREATER TROCHANTERIC FRACTURE (Left).  PMHx of peripheral neuropathy, L THA (over 10 year ago per pt), R TKA, and recent BLEPHAROPLASTY 10/10/17    PT Comments    Pt ambulated in hallway with recliner following.  Pt with shaky UEs at times (reports history of smoking).  Pt also practiced safe stair technique with spouse however required min assist for steadying.  Pt does not feel ready for d/c home today.    Follow Up Recommendations  Follow surgeon's recommendation for DC plan and follow-up therapies     Equipment Recommendations  None recommended by PT    Recommendations for Other Services       Precautions / Restrictions Precautions Precautions: Fall;Posterior Hip Precaution Comments: pt able to recall 1/3 posterior hip precautions; demonstrated and reviewed posterior hip precautions Restrictions Other Position/Activity Restrictions: WBAT    Mobility  Bed Mobility Overal bed mobility: Needs Assistance Bed Mobility: Supine to Sit     Supine to sit: Min assist;HOB elevated     General bed mobility comments: assist for L LE provided by spouse, verbal cues for safe technique within precautions  Transfers Overall transfer level: Needs assistance Equipment used: Rolling walker (2 wheeled) Transfers: Sit to/from Stand Sit to Stand: Min assist         General transfer comment: verbal cues for safe technique and maintaining precautions  Ambulation/Gait Ambulation/Gait assistance: Min guard Gait Distance (Feet): 60 Feet Assistive device: Rolling walker (2 wheeled) Gait Pattern/deviations: Step-to pattern;Decreased stance time - left;Antalgic     General Gait Details: verbal cues for sequence, RW  positioning, step length; recliner following for safety/fatigue   Stairs Stairs: Yes Stairs assistance: Min assist Stair Management: Step to pattern;Backwards;With walker Number of Stairs: 3 General stair comments: verbal and tactile cues for technique, sequence, safety; spouse assisted with RW; assist for steadying required   Wheelchair Mobility    Modified Rankin (Stroke Patients Only)       Balance                                            Cognition Arousal/Alertness: Awake/alert Behavior During Therapy: WFL for tasks assessed/performed Overall Cognitive Status: Within Functional Limits for tasks assessed                                        Exercises      General Comments        Pertinent Vitals/Pain Pain Assessment: 0-10 Pain Score: 4  Pain Location: L hip Pain Descriptors / Indicators: Aching;Sore Pain Intervention(s): Limited activity within patient's tolerance;Repositioned;Monitored during session    Home Living                      Prior Function            PT Goals (current goals can now be found in the care plan section) Progress towards PT goals: Progressing toward goals    Frequency    7X/week      PT Plan Current plan remains appropriate  Co-evaluation              AM-PAC PT "6 Clicks" Daily Activity  Outcome Measure  Difficulty turning over in bed (including adjusting bedclothes, sheets and blankets)?: None Difficulty moving from lying on back to sitting on the side of the bed? : A Lot Difficulty sitting down on and standing up from a chair with arms (e.g., wheelchair, bedside commode, etc,.)?: Unable Help needed moving to and from a bed to chair (including a wheelchair)?: A Little Help needed walking in hospital room?: A Little Help needed climbing 3-5 steps with a railing? : A Little 6 Click Score: 16    End of Session Equipment Utilized During Treatment: Gait  belt Activity Tolerance: Patient limited by fatigue Patient left: with call bell/phone within reach;in chair;with family/visitor present Nurse Communication: Mobility status PT Visit Diagnosis: Other abnormalities of gait and mobility (R26.89)     Time: 5537-4827 PT Time Calculation (min) (ACUTE ONLY): 36 min  Charges:  $Gait Training: 23-37 mins                     Carmelia Bake, PT, DPT Acute Rehabilitation Services Office: [phone] Pager: [phone]  Trena Platt 10/14/2017, 3:52 PM

## 2017-10-15 LAB — CBC
HCT: 27.7 % — ABNORMAL LOW (ref 36.0–46.0)
Hemoglobin: 8.9 g/dL — ABNORMAL LOW (ref 12.0–15.0)
MCH: 29.3 pg (ref 26.0–34.0)
MCHC: 32.1 g/dL (ref 30.0–36.0)
MCV: 91.1 fL (ref 78.0–100.0)
PLATELETS: 201 10*3/uL (ref 150–400)
RBC: 3.04 MIL/uL — AB (ref 3.87–5.11)
RDW: 16 % — AB (ref 11.5–15.5)
WBC: 7.7 10*3/uL (ref 4.0–10.5)

## 2017-10-15 NOTE — Plan of Care (Signed)
Pt alert and oriented, doing well this am. Plan to d/c home per MD order.

## 2017-10-15 NOTE — Discharge Summary (Signed)
Patient ID: [name] MRN: [id number] DOB/AGE: 1950/02/16 67 y.o.  Admit date: 10/13/2017 Discharge date: 10/15/2017  Admission Diagnoses:  Principal Problem:   Failed total hip arthroplasty [facility]) Active Problems:   Primary osteoarthritis of left hip   Discharge Diagnoses:  Same  Past Medical History:  Diagnosis Date  . Anxiety   . Aortic atherosclerosis ([place])   . Bilateral cataracts   . Carpal tunnel syndrome   . Cervical cancer ([place])   . Chronic back pain   . Claudication ([place])   . COPD (chronic obstructive pulmonary disease) ([facility])    wheezing  . DDD (degenerative disc disease), cervical   . DDD (degenerative disc disease), lumbar   . Depression   . Diverticulosis   . Gastroparesis   . GERD (gastroesophageal reflux disease)   . Hearing loss    Bilateral  . History of blood transfusion   . History of colon polyps   . History of migraine   . Hypertension   . Internal hemorrhoids   . Iron deficiency anemia   . Left-sided weakness   . Leg swelling   . Liver disease    pt unaware  . Lung nodule    several small  . OA (osteoarthritis)    both hands  . Positive ANA (antinuclear antibody)   . Pre-diabetes   . Raynaud's phenomenon   . Restless leg   . Seizures ([place])    no meds for 8 months  . Sleep apnea    does not use her cpap  . Spondylosis   . Stroke [facility]) 2015   no weakness  . 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   rt  . Urge incontinence of urine   . Varicose vein of leg   . Venous insufficiency     Surgeries: Procedure(s): LEFT TOTAL HIP REVISION ACETABULUM, OPEN REDUCTION INTERNAL WITH BONE GRAFT FIXATION LEFT GREATER TROCHANTERIC FRACTURE on 10/13/2017   Consultants:   Discharged Condition: Improved  Hospital Course: [name] is an 67 y.o. female who was admitted 10/13/2017 for operative treatment ofFailed total hip arthroplasty ([place]). Patient has severe unremitting pain that affects sleep, daily activities, and work/hobbies. After  pre-op clearance the patient was taken to the operating room on 10/13/2017 and underwent  Procedure(s): LEFT TOTAL HIP REVISION ACETABULUM, OPEN REDUCTION INTERNAL WITH BONE GRAFT FIXATION LEFT GREATER TROCHANTERIC FRACTURE.    Patient was given perioperative antibiotics:  Anti-infectives (From admission, onward)   Start     Dose/Rate Route Frequency Ordered Stop   10/13/17 0600  ceFAZolin (ANCEF) IVPB 2g/100 mL premix     2 g 200 mL/hr over 30 Minutes Intravenous On call to O.R. 10/13/17 7096 10/13/17 0729       Patient was given sequential compression devices, early ambulation, and chemoprophylaxis to prevent DVT.  Patient benefited maximally from hospital stay and there were no complications.    Recent vital signs:  Patient Vitals for the past 24 hrs:  BP Temp Temp src Pulse Resp SpO2  10/15/17 0603 (!) 123/59 98.1 F (36.7 C) Oral 76 20 95 %  10/15/17 0559 (!) 104/56 97.9 F (36.6 C) Oral (!) 51 - 100 %  10/15/17 0111 101/77 98.1 F (36.7 C) Oral 74 20 97 %  10/14/17 2136 114/65 98.1 F (36.7 C) Oral 84 - 97 %  10/14/17 1804 (!) 106/56 98.2 F (36.8 C) Oral 89 20 93 %  10/14/17 1557 (!) 92/39 98.5 F (36.9 C) Oral 75 18 97 %  10/14/17 1221 (!)  91/50 97.9 F (36.6 C) Oral 86 18 94 %  10/14/17 0851 114/63 (!) 97.5 F (36.4 C) Oral 88 14 97 %     Recent laboratory studies:  Recent Labs    10/14/17 0521 10/15/17 0400  WBC 6.6 7.7  HGB 9.2* 8.9*  HCT 28.4* 27.7*  PLT 175 201  NA 138  --   K 4.1  --   CL 104  --   CO2 29  --   BUN 13  --   CREATININE 0.76  --   GLUCOSE 158*  --   CALCIUM 8.6*  --      Discharge Medications:   Allergies as of 10/15/2017      Reactions   Bee Venom Anaphylaxis   Gabapentin Other (See Comments)   "Bad dreams"   Morphine And Related Other (See Comments)   Overly sedated   Codeine Hives   Adhesive [tape] Other (See Comments)   Redness, Paper tape is ok   Sulfa Antibiotics Other (See Comments)   Unspecified reaction from  childhood   Sulfonamide Derivatives Other (See Comments)   UNSPECIFIED REACTION OF CHILDHOOD      Medication List    STOP taking these medications   aspirin 81 MG chewable tablet Replaced by:  aspirin EC 81 MG tablet   oxyCODONE 5 MG immediate release tablet Commonly known as:  Oxy IR/ROXICODONE     TAKE these medications   ABSORBINE JR EX Apply 1 application topically 4 (four) times daily as needed (knee pain).   albuterol 108 (90 Base) MCG/ACT inhaler Commonly known as:  PROVENTIL HFA;VENTOLIN HFA Inhale 2 puffs into the lungs every 4 (four) hours as needed for wheezing or shortness of breath (cough).   aspirin EC 81 MG tablet Take 1 tablet (81 mg total) by mouth 2 (two) times daily. Replaces:  aspirin 81 MG chewable tablet   aspirin-sod bicarb-citric acid 325 MG Tbef tablet Commonly known as:  ALKA-SELTZER Take 650 mg by mouth every 8 (eight) hours as needed (nausea/indigestion.).   bisoprolol 10 MG tablet Commonly known as:  ZEBETA Take 10 mg by mouth daily.   buPROPion 150 MG 12 hr tablet Commonly known as:  ZYBAN Take 150 mg by mouth 2 (two) times daily.   calcium carbonate 750 MG chewable tablet Commonly known as:  TUMS EX Chew 2-4 tablets by mouth 4 (four) times daily as needed for heartburn.   clopidogrel 75 MG tablet Commonly known as:  PLAVIX Take 75 mg by mouth daily after breakfast.   docusate sodium 100 MG capsule Commonly known as:  COLACE Take 100-200 mg by mouth daily as needed (for constipation.).   DULoxetine 60 MG capsule Commonly known as:  CYMBALTA Take 60 mg by mouth daily after breakfast.   erythromycin ophthalmic ointment Place 1 application into both eyes 2 (two) times daily.   ferrous sulfate 325 (65 FE) MG tablet Take 1 tablet (325 mg total) by mouth daily with breakfast.   hydrochlorothiazide 25 MG tablet Commonly known as:  HYDRODIURIL Take 25 mg by mouth daily.   losartan-hydrochlorothiazide 100-25 MG tablet Commonly  known as:  HYZAAR Take 1 tablet by mouth daily.   methocarbamol 500 MG tablet Commonly known as:  ROBAXIN Take 1 tablet (500 mg total) by mouth every 6 (six) hours as needed for muscle spasms. What changed:    when to take this  reasons to take this   nitroGLYCERIN 0.4 MG SL tablet Commonly known as:  NITROSTAT Place 0.4  mg under the tongue every 5 (five) minutes x 3 doses as needed. For chest pain.   OVER THE COUNTER MEDICATION Apply 1 application topically as needed (joint pain). Magnilife topicall muscle rub   oxyCODONE-acetaminophen 7.5-325 MG tablet Commonly known as:  PERCOCET Take 1 tablet by mouth every 4 (four) hours as needed for severe pain. What changed:  Another medication with the same name was changed. Make sure you understand how and when to take each.   oxyCODONE-acetaminophen 7.5-325 MG tablet Commonly known as:  PERCOCET Take 1 tablet by mouth every 4 (four) hours as needed for severe pain. What changed:  how much to take   PREVACID 24HR 15 MG capsule Generic drug:  lansoprazole Take 15 mg by mouth daily as needed (for acid reflux).   rOPINIRole 1 MG tablet Commonly known as:  REQUIP Take 1 mg by mouth 3 (three) times daily.   Vitamin D-3 5000 units Tabs Take 5,000 Units by mouth daily.   XIIDRA 5 % Soln Generic drug:  Lifitegrast Place 1 drop into both eyes 2 (two) times daily as needed (for dry eyes).            Durable Medical Equipment  (From admission, onward)         Start     Ordered   10/13/17 1356  DME Walker rolling  Once    Question:  Patient needs a walker to treat with the following condition  Answer:  Status post total hip replacement, left   10/13/17 1355   10/13/17 1356  DME 3 n 1  Once     10/13/17 1355           Discharge Care Instructions  (From admission, onward)         Start     Ordered   10/15/17 0000  Weight bearing as tolerated     10/15/17 0717          Diagnostic Studies: Dg Chest 2  View  Result Date: 10/06/2017 CLINICAL DATA:  Hypertension, smoker, preoperative evaluation, COPD EXAM: CHEST - 2 VIEW COMPARISON:  10/23/2015 FINDINGS: Normal heart size, mediastinal contours, and pulmonary vascularity. Lungs clear. No pulmonary infiltrate, pleural effusion, or pneumothorax. Prior cervical spine fusion. IMPRESSION: No acute abnormalities. Electronically Signed   By: Lavonia Dana M.D.   On: 10/06/2017 16:36   Dg C-arm 1-60 Min-no Report  Result Date: 10/13/2017 Fluoroscopy was utilized by the requesting physician.  No radiographic interpretation.   Dg Hip Port Unilat With Pelvis 1v Left  Result Date: 10/13/2017 CLINICAL DATA:  Status post left total hip joint prosthesis placement. EXAM: DG HIP (WITH OR WITHOUT PELVIS) 1V PORT LEFT COMPARISON:  Fluoro spot images of today's date FINDINGS: The patient has undergone left total hip joint prosthesis placement. Radiographic positioning of the prosthetic components is good. The interface with the native bone appears normal. Avulsion of a portion of the greater trochanter is chronic. IMPRESSION: No immediate postprocedure complication following revision of left total hip joint prosthesis. Electronically Signed   By: David  Martinique M.D.   On: 10/13/2017 11:14   Dg Hip Operative Unilat With Pelvis Left  Result Date: 10/13/2017 CLINICAL DATA:  Left hip revision EXAM: OPERATIVE LEFT HIP (WITH PELVIS IF PERFORMED) 1 VIEWS TECHNIQUE: Fluoroscopic spot image(s) were submitted for interpretation post-operatively. COMPARISON:  None. FINDINGS: Single cross-table lateral view demonstrates changes of hip replacement. No visible complicating feature on this single intraoperative spot image. IMPRESSION: As above. Electronically Signed   By: Lennette Bihari  [place] M.D.   On: 10/13/2017 10:08    Disposition: Discharge disposition: 01-Home or Self Care       Discharge Instructions    Call MD / Call 911   Complete by:  As directed    If you experience chest  pain or shortness of breath, CALL 911 and be transported to the hospital emergency room.  If you develope a fever above 101 F, pus (white drainage) or increased drainage or redness at the wound, or calf pain, call your surgeon's office.   Constipation Prevention   Complete by:  As directed    Drink plenty of fluids.  Prune juice may be helpful.  You may use a stool softener, such as Colace (over the counter) 100 mg twice a day.  Use MiraLax (over the counter) for constipation as needed.   Diet - low sodium heart healthy   Complete by:  As directed    Driving restrictions   Complete by:  As directed    No driving for 2 weeks   Increase activity slowly as tolerated   Complete by:  As directed    Patient may shower   Complete by:  As directed    You may shower without a dressing once there is no drainage.  Do not wash over the wound.  If drainage remains, cover wound with plastic wrap and then shower.   Weight bearing as tolerated   Complete by:  As directed       Follow-up Information    Frederik Pear, MD In 2 weeks.   Specialty:  Orthopedic Surgery Contact information: [place] [address] [phone]        [facility] Follow up.   Specialty:  [place] Why:  physical therapy Contact information: [place] [address] [phone]            Signed: Joanell Rising 10/15/2017, 7:18 AM

## 2017-10-15 NOTE — Progress Notes (Signed)
Physical Therapy Treatment Patient Details Name: [name] MRN: [id number] DOB: 11/02/1950 Today's Date: 10/15/2017    History of Present Illness Pt is a 67 year old female s/p LEFT TOTAL HIP REVISION ACETABULUM, OPEN REDUCTION INTERNAL WITH BONE GRAFT FIXATION LEFT GREATER TROCHANTERIC FRACTURE (Left).  PMHx of peripheral neuropathy, L THA (over 10 year ago per pt), R TKA, and recent BLEPHAROPLASTY 10/10/17    PT Comments    Pt ambulated in hallway and practiced safe stair technique again this morning.  Pt also performed LE exercises and provided with HEP handout.  Reviewed and demonstrated posterior hip precautions as well as maintaining precautions during mobility and exercises.  Pt to d/c home today and feels ready.    Follow Up Recommendations  Follow surgeon's recommendation for DC plan and follow-up therapies     Equipment Recommendations  None recommended by PT    Recommendations for Other Services       Precautions / Restrictions Precautions Precautions: Fall;Posterior Hip Precaution Comments: pt able to recall 2/3 posterior hip precautions; demonstrated and reviewed posterior hip precautions; provided handout Restrictions Weight Bearing Restrictions: No Other Position/Activity Restrictions: WBAT    Mobility  Bed Mobility               General bed mobility comments: pt sitting up in recliner on arrival  Transfers Overall transfer level: Needs assistance Equipment used: Rolling walker (2 wheeled) Transfers: Sit to/from Stand Sit to Stand: Min guard         General transfer comment: verbal cues for safe technique and maintaining precautions  Ambulation/Gait Ambulation/Gait assistance: Min guard Gait Distance (Feet): 80 Feet Assistive device: Rolling walker (2 wheeled) Gait Pattern/deviations: Step-to pattern;Decreased stance time - left;Antalgic;Step-through pattern     General Gait Details: verbal cues for sequence, RW positioning, step  length   Stairs Stairs: Yes Stairs assistance: Min guard Stair Management: Step to pattern;Backwards;With walker Number of Stairs: 2 General stair comments: verbal and tactile cues for technique, sequence, safety; pt reports understanding   Wheelchair Mobility    Modified Rankin (Stroke Patients Only)       Balance                                            Cognition Arousal/Alertness: Awake/alert Behavior During Therapy: WFL for tasks assessed/performed Overall Cognitive Status: Within Functional Limits for tasks assessed                                        Exercises Total Joint Exercises Ankle Circles/Pumps: AROM;10 reps;Both Quad Sets: AROM;10 reps;Left Short Arc Quad: AROM;10 reps;Left Heel Slides: AAROM;10 reps;Left(within precautions) Hip ABduction/ADduction: AAROM;10 reps;Left    General Comments        Pertinent Vitals/Pain Pain Assessment: 0-10 Pain Score: 3  Pain Location: L hip Pain Descriptors / Indicators: Aching;Sore Pain Intervention(s): Limited activity within patient's tolerance;Repositioned;Monitored during session;Ice applied    Home Living                      Prior Function            PT Goals (current goals can now be found in the care plan section) Progress towards PT goals: Progressing toward goals    Frequency    7X/week  PT Plan Current plan remains appropriate    Co-evaluation              AM-PAC PT "6 Clicks" Daily Activity  Outcome Measure  Difficulty turning over in bed (including adjusting bedclothes, sheets and blankets)?: None Difficulty moving from lying on back to sitting on the side of the bed? : A Little Difficulty sitting down on and standing up from a chair with arms (e.g., wheelchair, bedside commode, etc,.)?: A Little Help needed moving to and from a bed to chair (including a wheelchair)?: A Little Help needed walking in hospital room?: A  Little Help needed climbing 3-5 steps with a railing? : A Little 6 Click Score: 19    End of Session Equipment Utilized During Treatment: Gait belt Activity Tolerance: Patient tolerated treatment well Patient left: with call bell/phone within reach;in chair Nurse Communication: Mobility status PT Visit Diagnosis: Other abnormalities of gait and mobility (R26.89)     Time: 1224-4975 PT Time Calculation (min) (ACUTE ONLY): 19 min  Charges:  $Therapeutic Exercise: 8-22 mins                     Carmelia Bake, PT, DPT Acute Rehabilitation Services Office: [phone] Pager: [phone]  Trena Platt 10/15/2017, 1:05 PM

## 2017-10-15 NOTE — Progress Notes (Signed)
PATIENT ID: [name]  MRN: [id number]  DOB/AGE:  1950-03-08 / 67 y.o.  2 Days Post-Op Procedure(s) (LRB): LEFT TOTAL HIP REVISION ACETABULUM, OPEN REDUCTION INTERNAL WITH BONE GRAFT FIXATION LEFT GREATER TROCHANTERIC FRACTURE (Left)    PROGRESS NOTE Subjective: Patient is alert, oriented, no Nausea, no Vomiting, yes passing gas, . Taking PO well. Denies SOB, Chest or Calf Pain. Using Incentive Spirometer, PAS in place. Ambulate WBAT with posterior hip precautions Patient reports pain as  moderate .    Objective: Vital signs in last 24 hours: Vitals:   10/14/17 2136 10/15/17 0111 10/15/17 0559 10/15/17 0603  BP: 114/65 101/77 (!) 104/56 (!) 123/59  Pulse: 84 74 (!) 51 76  Resp:  20  20  Temp: 98.1 F (36.7 C) 98.1 F (36.7 C) 97.9 F (36.6 C) 98.1 F (36.7 C)  TempSrc: Oral Oral Oral Oral  SpO2: 97% 97% 100% 95%  Weight:      Height:          Intake/Output from previous day: I/O last 3 completed shifts: In: 3141.5 [P.O.:1180; I.V.:1961.5] Out: 3000 [Urine:3000]   Intake/Output this shift: No intake/output data recorded.   LABORATORY DATA: Recent Labs    10/14/17 0521 10/15/17 0400  WBC 6.6 7.7  HGB 9.2* 8.9*  HCT 28.4* 27.7*  PLT 175 201  NA 138  --   K 4.1  --   CL 104  --   CO2 29  --   BUN 13  --   CREATININE 0.76  --   GLUCOSE 158*  --   CALCIUM 8.6*  --     Examination: Neurologically intact Neurovascular intact Sensation intact distally Intact pulses distally Dorsiflexion/Plantar flexion intact Incision: scant drainage No cellulitis present Compartment soft} XR AP&Lat of hip shows well placed\fixed THA  Assessment:   2 Days Post-Op Procedure(s) (LRB): LEFT TOTAL HIP REVISION ACETABULUM, OPEN REDUCTION INTERNAL WITH BONE GRAFT FIXATION LEFT GREATER TROCHANTERIC FRACTURE (Left) ADDITIONAL DIAGNOSIS:  Expected Acute Blood Loss Anemia, obesity, Raynauds, tobacco abuse, epilepsy   Plan: PT/OT WBAT, posterior THA  DVT Prophylaxis:  SCDx72 hrs, ASA 81 mg BID x 2 weeks  DISCHARGE PLAN: Home  DISCHARGE NEEDS: HHPT, Walker and 3-in-1 comode seat

## 2017-10-16 DIAGNOSIS — Z96653 Presence of artificial knee joint, bilateral: Secondary | ICD-10-CM | POA: Diagnosis not present

## 2017-10-16 DIAGNOSIS — T84021D Dislocation of internal left hip prosthesis, subsequent encounter: Secondary | ICD-10-CM | POA: Diagnosis not present

## 2017-10-16 DIAGNOSIS — J449 Chronic obstructive pulmonary disease, unspecified: Secondary | ICD-10-CM | POA: Diagnosis not present

## 2017-10-16 DIAGNOSIS — I70213 Atherosclerosis of native arteries of extremities with intermittent claudication, bilateral legs: Secondary | ICD-10-CM | POA: Diagnosis not present

## 2017-10-16 DIAGNOSIS — I7300 Raynaud's syndrome without gangrene: Secondary | ICD-10-CM | POA: Diagnosis not present

## 2017-10-16 DIAGNOSIS — Z471 Aftercare following joint replacement surgery: Secondary | ICD-10-CM | POA: Diagnosis not present

## 2017-10-16 DIAGNOSIS — K5790 Diverticulosis of intestine, part unspecified, without perforation or abscess without bleeding: Secondary | ICD-10-CM | POA: Diagnosis not present

## 2017-10-16 DIAGNOSIS — Z969 Presence of functional implant, unspecified: Secondary | ICD-10-CM | POA: Diagnosis not present

## 2017-10-16 DIAGNOSIS — I83813 Varicose veins of bilateral lower extremities with pain: Secondary | ICD-10-CM | POA: Diagnosis not present

## 2017-10-17 DIAGNOSIS — T84021D Dislocation of internal left hip prosthesis, subsequent encounter: Secondary | ICD-10-CM | POA: Diagnosis not present

## 2017-10-17 DIAGNOSIS — I70213 Atherosclerosis of native arteries of extremities with intermittent claudication, bilateral legs: Secondary | ICD-10-CM | POA: Diagnosis not present

## 2017-10-17 DIAGNOSIS — I83813 Varicose veins of bilateral lower extremities with pain: Secondary | ICD-10-CM | POA: Diagnosis not present

## 2017-10-17 DIAGNOSIS — J449 Chronic obstructive pulmonary disease, unspecified: Secondary | ICD-10-CM | POA: Diagnosis not present

## 2017-10-17 DIAGNOSIS — I7300 Raynaud's syndrome without gangrene: Secondary | ICD-10-CM | POA: Diagnosis not present

## 2017-10-17 DIAGNOSIS — K5790 Diverticulosis of intestine, part unspecified, without perforation or abscess without bleeding: Secondary | ICD-10-CM | POA: Diagnosis not present

## 2017-10-20 DIAGNOSIS — K5790 Diverticulosis of intestine, part unspecified, without perforation or abscess without bleeding: Secondary | ICD-10-CM | POA: Diagnosis not present

## 2017-10-20 DIAGNOSIS — I7300 Raynaud's syndrome without gangrene: Secondary | ICD-10-CM | POA: Diagnosis not present

## 2017-10-20 DIAGNOSIS — I70213 Atherosclerosis of native arteries of extremities with intermittent claudication, bilateral legs: Secondary | ICD-10-CM | POA: Diagnosis not present

## 2017-10-20 DIAGNOSIS — I83813 Varicose veins of bilateral lower extremities with pain: Secondary | ICD-10-CM | POA: Diagnosis not present

## 2017-10-20 DIAGNOSIS — T84021D Dislocation of internal left hip prosthesis, subsequent encounter: Secondary | ICD-10-CM | POA: Diagnosis not present

## 2017-10-20 DIAGNOSIS — J449 Chronic obstructive pulmonary disease, unspecified: Secondary | ICD-10-CM | POA: Diagnosis not present

## 2017-10-22 DIAGNOSIS — J449 Chronic obstructive pulmonary disease, unspecified: Secondary | ICD-10-CM | POA: Diagnosis not present

## 2017-10-22 DIAGNOSIS — I83813 Varicose veins of bilateral lower extremities with pain: Secondary | ICD-10-CM | POA: Diagnosis not present

## 2017-10-22 DIAGNOSIS — K5790 Diverticulosis of intestine, part unspecified, without perforation or abscess without bleeding: Secondary | ICD-10-CM | POA: Diagnosis not present

## 2017-10-22 DIAGNOSIS — I70213 Atherosclerosis of native arteries of extremities with intermittent claudication, bilateral legs: Secondary | ICD-10-CM | POA: Diagnosis not present

## 2017-10-22 DIAGNOSIS — T84021D Dislocation of internal left hip prosthesis, subsequent encounter: Secondary | ICD-10-CM | POA: Diagnosis not present

## 2017-10-22 DIAGNOSIS — I7300 Raynaud's syndrome without gangrene: Secondary | ICD-10-CM | POA: Diagnosis not present

## 2017-10-23 DIAGNOSIS — J449 Chronic obstructive pulmonary disease, unspecified: Secondary | ICD-10-CM | POA: Diagnosis not present

## 2017-10-23 DIAGNOSIS — K5790 Diverticulosis of intestine, part unspecified, without perforation or abscess without bleeding: Secondary | ICD-10-CM | POA: Diagnosis not present

## 2017-10-23 DIAGNOSIS — I7300 Raynaud's syndrome without gangrene: Secondary | ICD-10-CM | POA: Diagnosis not present

## 2017-10-23 DIAGNOSIS — T84021D Dislocation of internal left hip prosthesis, subsequent encounter: Secondary | ICD-10-CM | POA: Diagnosis not present

## 2017-10-23 DIAGNOSIS — I83813 Varicose veins of bilateral lower extremities with pain: Secondary | ICD-10-CM | POA: Diagnosis not present

## 2017-10-23 DIAGNOSIS — I70213 Atherosclerosis of native arteries of extremities with intermittent claudication, bilateral legs: Secondary | ICD-10-CM | POA: Diagnosis not present

## 2017-10-28 DIAGNOSIS — M25552 Pain in left hip: Secondary | ICD-10-CM | POA: Diagnosis not present

## 2017-10-28 DIAGNOSIS — Z471 Aftercare following joint replacement surgery: Secondary | ICD-10-CM | POA: Diagnosis not present

## 2017-10-28 DIAGNOSIS — Z96642 Presence of left artificial hip joint: Secondary | ICD-10-CM | POA: Diagnosis not present

## 2017-10-31 ENCOUNTER — Emergency Department (HOSPITAL_COMMUNITY)
Admission: EM | Admit: 2017-10-31 | Discharge: 2017-10-31 | Disposition: A | Discharge status: Home / Self Care | Payer: Medicare Other | Attending: Emergency Medicine | Admitting: Emergency Medicine

## 2017-10-31 ENCOUNTER — Emergency Department (HOSPITAL_COMMUNITY): Payer: Medicare Other

## 2017-10-31 ENCOUNTER — Encounter (HOSPITAL_COMMUNITY): Payer: Self-pay

## 2017-10-31 DIAGNOSIS — Z79899 Other long term (current) drug therapy: Secondary | ICD-10-CM | POA: Insufficient documentation

## 2017-10-31 DIAGNOSIS — Z96641 Presence of right artificial hip joint: Secondary | ICD-10-CM | POA: Diagnosis not present

## 2017-10-31 DIAGNOSIS — Z7982 Long term (current) use of aspirin: Secondary | ICD-10-CM | POA: Insufficient documentation

## 2017-10-31 DIAGNOSIS — Z96651 Presence of right artificial knee joint: Secondary | ICD-10-CM | POA: Diagnosis not present

## 2017-10-31 DIAGNOSIS — J449 Chronic obstructive pulmonary disease, unspecified: Secondary | ICD-10-CM | POA: Diagnosis not present

## 2017-10-31 DIAGNOSIS — R569 Unspecified convulsions: Secondary | ICD-10-CM | POA: Diagnosis not present

## 2017-10-31 DIAGNOSIS — F17210 Nicotine dependence, cigarettes, uncomplicated: Secondary | ICD-10-CM | POA: Diagnosis not present

## 2017-10-31 DIAGNOSIS — R0789 Other chest pain: Secondary | ICD-10-CM | POA: Diagnosis not present

## 2017-10-31 DIAGNOSIS — R079 Chest pain, unspecified: Secondary | ICD-10-CM | POA: Diagnosis not present

## 2017-10-31 DIAGNOSIS — I10 Essential (primary) hypertension: Secondary | ICD-10-CM | POA: Diagnosis not present

## 2017-10-31 DIAGNOSIS — R450 Nervousness: Secondary | ICD-10-CM | POA: Diagnosis not present

## 2017-10-31 DIAGNOSIS — R457 State of emotional shock and stress, unspecified: Secondary | ICD-10-CM | POA: Diagnosis not present

## 2017-10-31 DIAGNOSIS — J209 Acute bronchitis, unspecified: Secondary | ICD-10-CM | POA: Diagnosis not present

## 2017-10-31 DIAGNOSIS — J40 Bronchitis, not specified as acute or chronic: Secondary | ICD-10-CM | POA: Diagnosis not present

## 2017-10-31 DIAGNOSIS — R0602 Shortness of breath: Secondary | ICD-10-CM | POA: Diagnosis not present

## 2017-10-31 DIAGNOSIS — R0902 Hypoxemia: Secondary | ICD-10-CM | POA: Diagnosis not present

## 2017-10-31 LAB — BASIC METABOLIC PANEL
Anion gap: 8 (ref 5–15)
BUN: 12 mg/dL (ref 8–23)
CHLORIDE: 103 mmol/L (ref 98–111)
CO2: 23 mmol/L (ref 22–32)
Calcium: 8.8 mg/dL — ABNORMAL LOW (ref 8.9–10.3)
Creatinine, Ser: 0.89 mg/dL (ref 0.44–1.00)
GFR calc Af Amer: >60 mL/min (ref >60)
GFR calc non Af Amer: >60 mL/min (ref >60)
GLUCOSE: 99 mg/dL (ref 70–99)
POTASSIUM: 3.9 mmol/L (ref 3.5–5.1)
Sodium: 134 mmol/L — ABNORMAL LOW (ref 135–145)

## 2017-10-31 LAB — CBC
HEMATOCRIT: 32.3 % — AB (ref 36.0–46.0)
HEMOGLOBIN: 9.5 g/dL — AB (ref 12.0–15.0)
MCH: 27.4 pg (ref 26.0–34.0)
MCHC: 29.4 g/dL — AB (ref 30.0–36.0)
MCV: 93.1 fL (ref 80.0–100.0)
Platelets: 215 10*3/uL (ref 150–400)
RBC: 3.47 MIL/uL — ABNORMAL LOW (ref 3.87–5.11)
RDW: 15.9 % — ABNORMAL HIGH (ref 11.5–15.5)
WBC: 5.5 10*3/uL (ref 4.0–10.5)
nRBC: 0 % (ref 0.0–0.2)

## 2017-10-31 LAB — HEPATIC FUNCTION PANEL
ALBUMIN: 3.3 g/dL — AB (ref 3.5–5.0)
ALT: 20 U/L (ref 0–44)
AST: 22 U/L (ref 15–41)
Alkaline Phosphatase: 137 U/L — ABNORMAL HIGH (ref 38–126)
Bilirubin, Direct: <0.1 mg/dL (ref 0.0–0.2)
TOTAL PROTEIN: 6.3 g/dL — AB (ref 6.5–8.1)
Total Bilirubin: 0.1 mg/dL — ABNORMAL LOW (ref 0.3–1.2)

## 2017-10-31 LAB — I-STAT TROPONIN, ED: Troponin i, poc: 0 ng/mL (ref 0.00–0.08)

## 2017-10-31 MED ORDER — ALBUTEROL SULFATE HFA 108 (90 BASE) MCG/ACT IN AERS
2.0000 | INHALATION_SPRAY | Freq: Once | RESPIRATORY_TRACT | Status: AC
  Administered 2017-10-31: 2 via RESPIRATORY_TRACT
  Filled 2017-10-31: qty 6.7

## 2017-10-31 MED ORDER — IOPAMIDOL (ISOVUE-370) INJECTION 76%
INTRAVENOUS | Status: AC
  Filled 2017-10-31: qty 100

## 2017-10-31 MED ORDER — PREDNISONE 20 MG PO TABS
60.0000 mg | ORAL_TABLET | Freq: Once | ORAL | Status: AC
  Administered 2017-10-31: 60 mg via ORAL
  Filled 2017-10-31: qty 3

## 2017-10-31 MED ORDER — ROPINIROLE HCL 1 MG PO TABS
1.0000 mg | ORAL_TABLET | Freq: Three times a day (TID) | ORAL | Status: DC
  Filled 2017-10-31: qty 1

## 2017-10-31 MED ORDER — SODIUM CHLORIDE 0.9 % IV BOLUS
1000.0000 mL | Freq: Once | INTRAVENOUS | Status: AC
  Administered 2017-10-31: 1000 mL via INTRAVENOUS

## 2017-10-31 MED ORDER — PREDNISONE 10 MG PO TABS: 40.0000 mg | ORAL_TABLET | Freq: Every day | ORAL | 0 refills | Status: AC

## 2017-10-31 MED ORDER — AEROCHAMBER PLUS FLO-VU LARGE MISC: 1.0000 | Freq: Once | Status: DC

## 2017-10-31 MED ORDER — IOPAMIDOL (ISOVUE-370) INJECTION 76%
100.0000 mL | Freq: Once | INTRAVENOUS | Status: AC | PRN
  Administered 2017-10-31: 100 mL via INTRAVENOUS

## 2017-10-31 MED ORDER — LORAZEPAM 2 MG/ML IJ SOLN
1.0000 mg | Freq: Once | INTRAMUSCULAR | Status: AC
  Administered 2017-10-31: 1 mg via INTRAVENOUS
  Filled 2017-10-31: qty 1

## 2017-10-31 MED ORDER — ONDANSETRON HCL 4 MG/2ML IJ SOLN
4.0000 mg | Freq: Once | INTRAMUSCULAR | Status: AC
  Administered 2017-10-31: 4 mg via INTRAVENOUS
  Filled 2017-10-31: qty 2

## 2017-10-31 NOTE — Discharge Instructions (Signed)
Please use your inhaler as directed. If you experience any chest pain, shortness of breath or worsening symptoms please return to the ED for reevaluation.

## 2017-10-31 NOTE — ED Triage Notes (Signed)
Pt brought in by [facility] from home for difficulty "taking a full breath" x1 week. Pt had left hip replacement x2 weeks ago, per EMS pt is able to ambulate with her walker. Pt states she can't lay flat, states when she does she becomes short of breath and has to pace around her room to feel better. Per EMS pt has hx of anxiety and at times she "shakes so bad it looks like she is having a seizure". Pt visibly anxious on arrival.

## 2017-10-31 NOTE — ED Provider Notes (Signed)
[facility] Provider Note   CSN: [id number] Arrival date & time: 10/31/17  1543     History   Chief Complaint Chief Complaint  Patient presents with  . Shortness of Breath  . Chest Pain    HPI [name] is a 67 y.o. female.  67 y/o female smoker presents to the ED with a chief complaint of shortness of breath since this morning. Patient reports she had a couple of episodes were she felt like "I am not getting enough air". She reports this is worse when she tries to lay down flat. Patient had a left hip replacement 2-1/2 weeks ago.  Reports she took some oxycodone prior to coming in and states some relief of pain.  She also reports she is been more short of breath when walking.  Also reports a cough which she states she has not been able to clear this cough.  She was satting at 96% on room air by EMS therefore she was placed on 2 L of oxygen.  Is not have oxygen at home.  According to patient's records patient is on Plavix, when asked about this medication patient reports she does not know if she is taking it daily.  Denies any fever, sore throat, abdominal pain.  She does report some chest pressure which she feels as her air is trying to be caught off.     Past Medical History:  Diagnosis Date  . Anxiety   . Aortic atherosclerosis ([place])   . Bilateral cataracts   . Carpal tunnel syndrome   . Cervical cancer ([place])   . Chronic back pain   . Claudication ([place])   . COPD (chronic obstructive pulmonary disease) ([facility])    wheezing  . DDD (degenerative disc disease), cervical   . DDD (degenerative disc disease), lumbar   . Depression   . Diverticulosis   . Gastroparesis   . GERD (gastroesophageal reflux disease)   . Hearing loss    Bilateral  . History of blood transfusion   . History of colon polyps   . History of migraine   . Hypertension   . Internal hemorrhoids   . Iron deficiency anemia   . Left-sided weakness   . Leg  swelling   . Liver disease    pt unaware  . Lung nodule    several small  . OA (osteoarthritis)    both hands  . Positive ANA (antinuclear antibody)   . Pre-diabetes   . Raynaud's phenomenon   . Restless leg   . Seizures ([place])    no meds for 8 months  . Sleep apnea    does not use her cpap  . Spondylosis   . Stroke [facility]) 2015   no weakness  . 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   rt  . Urge incontinence of urine   . Varicose vein of leg   . Venous insufficiency     Patient Active Problem List   Diagnosis Date Noted  . Primary osteoarthritis of left hip 10/13/2017  . 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 Positive ANA (antinuclear antibody) 02/06/2017  . Raynaud's phenomenon without gangrene 02/06/2017  . Primary osteoarthritis of both hands 02/06/2017  . DDD (degenerative disc disease), cervical 02/06/2017  . DDD (degenerative disc disease), lumbar 02/06/2017  . History of left hip replacement 02/06/2017  . Neck pain on left side 08/05/2016  . Claudication in peripheral vascular disease ([place]) 07/14/2016  . H/O total knee replacement, right  01/12/2016  . Pain in joint, lower leg 05/09/2014  . Convulsion ([place]) 01/17/2014  . Left-sided weakness 01/17/2014  . 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 Left hand weakness 08/24/2012  . CERVICAL CANCER 04/03/2007  . COLONIC POLYPS 04/03/2007  . OVERWEIGHT 04/03/2007  . ANXIETY DEPRESSION 04/03/2007  . TOBACCO ABUSE 04/03/2007  . Essential hypertension 04/03/2007  . INTERNAL HEMORRHOIDS 04/03/2007  . GERD 04/03/2007  . Gastroparesis 04/03/2007  . Diverticulosis of colon 04/03/2007  . BACK PAIN, CHRONIC 04/03/2007  . CHEST PAIN, ATYPICAL 04/03/2007    Past Surgical History:  Procedure Laterality Date  . BACK SURGERY    . BLEPHAROPLASTY  10/10/2017  . CATARACT EXTRACTION, BILATERAL    . CESAREAN SECTION    . CHOLECYSTECTOMY    . COLONOSCOPY  10/2016  . HAND SURGERY Bilateral    carpal tunnel  . HEMORRHOID  SURGERY    . INTERSTIM IMPLANT PLACEMENT    . KNEE SURGERY Left    arthroscopy x3  . LOWER EXTREMITY ANGIOGRAPHY N/A 07/16/2016   Procedure: Lower Extremity Angiography;  Surgeon: Adrian Prows, MD;  Location: [facility];  Service: Cardiovascular;  Laterality: N/A;  . LOWER EXTREMITY INTERVENTION N/A 08/06/2016   Procedure: Lower Extremity Intervention;  Surgeon: Adrian Prows, MD;  Location: [facility];  Service: Cardiovascular;  Laterality: N/A;  . NECK SURGERY    . OTHER SURGICAL HISTORY  2013   bladder stimulator in back  . PERIPHERAL VASCULAR BALLOON ANGIOPLASTY  08/06/2016   Procedure: Peripheral Vascular Balloon Angioplasty;  Surgeon: Adrian Prows, MD;  Location: [facility];  Service: Cardiovascular;;  Right SFA  . TENNIS ELBOW RELEASE/NIRSCHEL PROCEDURE Right 12/2015  . TOE SURGERY Right    right foot second and fourth toe  . TOTAL HIP ARTHROPLASTY Left   . TOTAL HIP REVISION Left 10/13/2017   Procedure: LEFT TOTAL HIP REVISION ACETABULUM, OPEN REDUCTION INTERNAL WITH BONE GRAFT FIXATION LEFT GREATER TROCHANTERIC FRACTURE;  Surgeon: Frederik Pear, MD;  Location: [facility];  Service: Orthopedics;  Laterality: Left;  . TOTAL KNEE ARTHROPLASTY Right 01/12/2016   Procedure: RIGHT TOTAL KNEE ARTHROPLASTY;  Surgeon: Netta Cedars, MD;  [facility];  Service: Orthopedics;  Laterality: Right;  . TUBAL LIGATION    . UPPER GI ENDOSCOPY  10/2016  . VAGINAL HYSTERECTOMY       OB History   None      Home Medications    Prior to Admission medications   Medication Sig Start Date End Date Taking? Authorizing Provider  aspirin EC 81 MG tablet Take 1 tablet (81 mg total) by mouth 2 (two) times daily. Patient taking differently: Take 81 mg by mouth daily.  10/13/17  Yes Joanell Rising K, PA-C  aspirin-sod bicarb-citric acid (ALKA-SELTZER) 325 MG TBEF tablet Take 650 mg by mouth at bedtime.    Yes [provider]  calcium carbonate (TUMS EX) 750 MG chewable tablet Chew 2  tablets by mouth 4 (four) times daily as needed for heartburn.    Yes [provider]  Cholecalciferol (VITAMIN D-3) 5000 units TABS Take 5,000 Units by mouth daily.   Yes [provider]  docusate sodium (COLACE) 100 MG capsule Take 100-200 mg by mouth daily as needed (for constipation.).    Yes [provider]  methocarbamol (ROBAXIN) 500 MG tablet Take 1 tablet (500 mg total) by mouth every 6 (six) hours as needed for muscle spasms. Patient taking differently: Take 500 mg by mouth See admin instructions. Take one  tablet (500 mg) by mouth daily at bedtime, may also take one tablet (500 mg) during the day as needed for muscle spasms 10/13/17  Yes Leighton Parody, PA-C  nitroGLYCERIN (NITROSTAT) 0.4 MG SL tablet Place 0.4 mg under the tongue every 5 (five) minutes x 3 doses as needed for chest pain.  05/17/16  Yes [provider]  oxyCODONE (OXY IR/ROXICODONE) 5 MG immediate release tablet Take 5 mg by mouth every 6 (six) hours as needed for severe pain.   Yes [provider]  oxyCODONE-acetaminophen (PERCOCET) 7.5-325 MG tablet Take 1 tablet by mouth every 4 (four) hours as needed for severe pain. 10/13/17  Yes Joanell Rising K, PA-C  rOPINIRole (REQUIP) 1 MG tablet Take 1 mg by mouth 3 (three) times daily.    Yes [provider]  albuterol (PROVENTIL HFA;VENTOLIN HFA) 108 (90 BASE) MCG/ACT inhaler Inhale 2 puffs into the lungs every 4 (four) hours as needed for wheezing or shortness of breath (cough). 11/10/14   Pisciotta, Elmyra Ricks, PA-C  bisoprolol (ZEBETA) 10 MG tablet Take 10 mg by mouth daily. 07/28/17   [provider]  buPROPion (ZYBAN) 150 MG 12 hr tablet Take 150 mg by mouth 2 (two) times daily.  01/11/17   [provider]  clopidogrel (PLAVIX) 75 MG tablet Take 75 mg by mouth daily after breakfast. 06/26/16   [provider]  DULoxetine (CYMBALTA) 60 MG capsule Take 60 mg by mouth daily after breakfast.     [provider]  erythromycin ophthalmic ointment Place 1 application into both eyes 2 (two) times daily. 10/10/17   [provider]  ferrous sulfate 325 (65 FE) MG tablet Take 1 tablet (325 mg total) by mouth daily with breakfast. 02/20/17   Wyatt Portela, MD  hydrochlorothiazide (MICROZIDE) 12.5 MG capsule Take 12.5 mg by mouth daily.     [provider]  lansoprazole (PREVACID 24HR) 15 MG capsule Take 15 mg by mouth daily as needed (for acid reflux).    [provider]  losartan-hydrochlorothiazide (HYZAAR) 100-25 MG tablet Take 1 tablet by mouth daily.    [provider]  OVER THE COUNTER MEDICATION Apply 1 application topically as needed (joint pain). Magnilife topicall muscle rub    [provider]  predniSONE (DELTASONE) 10 MG tablet Take 4 tablets (40 mg total) by mouth daily for 5 days. 10/31/17 11/05/17  Janeece Fitting, PA-C  Tolnaftate (ABSORBINE JR EX) Apply 1 application topically 4 (four) times daily as needed (knee pain).     [provider]    Family History Family History  Problem Relation Age of Onset  . Hyperlipidemia Mother   . Hypertension Mother   . Hyperlipidemia Father   . Hypertension Father   . Alzheimer's disease Father   . Stroke Brother   . Cancer Daughter        unknown  . Colon cancer Neg Hx   . Esophageal cancer Neg Hx   . Rectal cancer Neg Hx   . Stomach cancer Neg Hx     Social History Social History   Tobacco Use  . Smoking status: Current Every Day Smoker    Packs/day: 1.00    Years: 53.00    Pack years: 53.00  . Smokeless tobacco: Never Used  . Tobacco comment: DOWN TO 1 PACK DAY  Substance Use Topics  . Alcohol use: No    Alcohol/week: 0.0 standard drinks  . Drug use: No     Allergies   Bee  venom; Gabapentin; Morphine and related; Codeine; Adhesive [tape]; Sulfa antibiotics; and Sulfonamide derivatives   Review of Systems Review of Systems  Constitutional: Negative for chills and  fever.  HENT: Negative for rhinorrhea, sinus pressure, sinus pain and sore throat.   Respiratory: Positive for chest tightness and shortness of breath.   Cardiovascular: Positive for chest pain. Negative for leg swelling.  Gastrointestinal: Negative for abdominal pain, diarrhea, nausea and vomiting.  Genitourinary: Negative for dysuria and flank pain.  Musculoskeletal: Negative for back pain and myalgias.  Skin: Negative for pallor and wound.  Neurological: Negative for light-headedness and headaches.  All other systems reviewed and are negative.    Physical Exam Updated Vital Signs BP 139/64   Pulse 77   Temp 97.8 F (36.6 C) (Oral)   Resp 14   Ht 5\' 1"  (1.549 m)   Wt 94 kg   SpO2 100%   BMI 39.16 kg/m   Physical Exam  Constitutional: She is oriented to person, place, and time. She appears well-developed and well-nourished.  HENT:  Head: Normocephalic and atraumatic.  Eyes: Pupils are equal, round, and reactive to light.  Cardiovascular: Normal heart sounds.  Pulmonary/Chest: Effort normal. She has no decreased breath sounds. She has no wheezes.  Abdominal: Soft. Bowel sounds are normal. There is no tenderness.  Musculoskeletal:       Right lower leg: She exhibits no tenderness and no edema.       Left lower leg: She exhibits no tenderness and no edema.  No calf tenderness noted on BL extremeties. Patient has full ROM of extremities. Pulses present. Decrease strength on left leg, per patient this is her baseline.   Neurological: She is alert and oriented to person, place, and time.  Skin: Skin is dry.  Nursing note and vitals reviewed.    ED Treatments / Results  Labs (all labs ordered are listed, but only abnormal results are displayed) Labs Reviewed  BASIC METABOLIC PANEL - Abnormal; Notable for the following components:      Result Value   Sodium 134 (*)    Calcium 8.8 (*)    All other components within normal limits  CBC - Abnormal; Notable for the following  components:   RBC 3.47 (*)    Hemoglobin 9.5 (*)    HCT 32.3 (*)    MCHC 29.4 (*)    RDW 15.9 (*)    All other components within normal limits  HEPATIC FUNCTION PANEL - Abnormal; Notable for the following components:   Total Protein 6.3 (*)    Albumin 3.3 (*)    Alkaline Phosphatase 137 (*)    Total Bilirubin 0.1 (*)    All other components within normal limits  I-STAT TROPONIN, ED    EKG EKG Interpretation  Date/Time:  Friday October 31 2017 15:51:45 EDT Ventricular Rate:  71 PR Interval:    QRS Duration: 107 QT Interval:  404 QTC Calculation: 439 R Axis:   42 Text Interpretation:  Sinus rhythm Low voltage, precordial leads since last tracing no significant change Confirmed by Daleen Bo [phone]) on 10/31/2017 4:52:05 PM   Radiology Dg Chest 2 View  Result Date: 10/31/2017 CLINICAL DATA:  Centralized chest pain and pressure. EXAM: CHEST - 2 VIEW COMPARISON:  Chest radiograph 10/06/2017, chest CT 07/31/2011 FINDINGS: Cardiomediastinal silhouette is normal. Mediastinal contours appear intact. There is no evidence of focal airspace consolidation, pleural effusion or pneumothorax. Possible few mm pulmonary nodule in the right upper lobe seen on the frontal view. Osseous structures  are without acute abnormality. Soft tissues are grossly normal. Stable appearance of cervical spinal fusion. IMPRESSION: No evidence of lobar consolidation or pulmonary edema. Possible few mm pulmonary nodule in the right upper lobe. Electronically Signed   By: Fidela Salisbury M.D.   On: 10/31/2017 17:13   Ct Angio Chest Pe W And/or Wo Contrast  Result Date: 10/31/2017 CLINICAL DATA:  Shortness of breath EXAM: CT ANGIOGRAPHY CHEST WITH CONTRAST TECHNIQUE: Multidetector CT imaging of the chest was performed using the standard protocol during bolus administration of intravenous contrast. Multiplanar CT image reconstructions and MIPs were obtained to evaluate the vascular anatomy. CONTRAST:  136mL  ISOVUE-370 IOPAMIDOL (ISOVUE-370) INJECTION 76% COMPARISON:  CTA Chest 07/31/2011 FINDINGS: Cardiovascular: --Pulmonary arteries: Contrast injection is sufficient to demonstrate satisfactory opacification of the pulmonary arteries to the segmental level. There is no pulmonary embolus. The main pulmonary artery is within normal limits for size. --Aorta: Limited opacification of the aorta due to bolus timing optimization for the pulmonary arteries. Conventional 3 vessel aortic branching pattern. The aortic course and caliber are normal. There is minimal aortic atherosclerosis. --Heart: Normal size. No pericardial effusion. There are coronary artery calcifications Mediastinum/Nodes: No mediastinal, hilar or axillary lymphadenopathy. The visualized thyroid and thoracic esophageal course are unremarkable. Lungs/Pleura: Unchanged appearance of scattered pulmonary nodules there are likely the sequela of prior granulomatous infection. Upper Abdomen: Contrast bolus timing is not optimized for evaluation of the abdominal organs. Within this limitation, the visualized organs of the upper abdomen are normal. Musculoskeletal: No chest wall abnormality. No acute or significant osseous findings. Review of the MIP images confirms the above findings. IMPRESSION: 1. No pulmonary embolus or other acute thoracic abnormality. 2. Coronary artery and aortic atherosclerosis (ICD10-I70.0). 3. Unchanged appearance of scattered, tiny pulmonary nodules likely related to prior granulomatous infection. Electronically Signed   By: Ulyses Jarred M.D.   On: 10/31/2017 20:04    Procedures Procedures (including critical care time)  Medications Ordered in ED Medications  rOPINIRole (REQUIP) tablet 1 mg (has no administration in time range)  predniSONE (DELTASONE) tablet 60 mg (has no administration in time range)  albuterol (PROVENTIL HFA;VENTOLIN HFA) 108 (90 Base) MCG/ACT inhaler 2 puff (has no administration in time range)  AEROCHAMBER  PLUS FLO-VU MEDIUM MISC 1 each (has no administration in time range)  sodium chloride 0.9 % bolus 1,000 mL (0 mLs Intravenous Stopped 10/31/17 1942)  ondansetron (ZOFRAN) injection 4 mg (4 mg Intravenous Given 10/31/17 1743)  LORazepam (ATIVAN) injection 1 mg (1 mg Intravenous Given 10/31/17 1942)  iopamidol (ISOVUE-370) 76 % injection 100 mL (100 mLs Intravenous Contrast Given 10/31/17 1942)     Initial Impression / Assessment and Plan / ED Course  I have reviewed the triage vital signs and the nursing notes.  Pertinent labs & imaging results that were available during my care of the patient were reviewed by me and considered in my medical decision making (see chart for details).    Presents with shortness of breath.  She only had left hip replacement surgery x 2 1/2 weeks ago.  She reports she is compliant with her medication.  Patient brought in with O2 duration stats on the lower side, she was placed on 2 L of oxygen, she is currently satting at 100% on nasal cannula.  BC showed no leukocytosis, globin is 9.5 consistent with her previous visits.  CMP showed slight decrease in sodium at 135, creatinine level is within normal limits.  Patient's first troponin was negative.  DG chest 2 view  showed an area nodule on the right upper lobe.  She reports this chest pain is worse when she lays down, will need to further investigate this with a CT angios chest to rule out any PEs.  She does have a history of heavy smoking along with noncompliant with medication.  6:47 PM spoke to pharmacy technician who works patient has not had any hypertension, statin, medications refilled since March.  According to patient's records her last medications were refilled in March for a total of 60 days, patient reports she still has these medications at home.  Patient is supposed to be on Plavix but has not been taking this medication on the regular, when I asked her about this medication she reports she does take it  sometimes.  The only refills that patient has had on her medications have been pain medications such as oxycodone's.  Patient noncompliance medication and smoking history we will need to further evaluate her shortness of breath.  6:48 PM is given Ativan prior to CT scan as she reports some claustrophobia. CT Angio showed:  1. No pulmonary embolus or other acute thoracic abnormality. 2. Coronary artery and aortic atherosclerosis (ICD10-I70.0). 3. Unchanged appearance of scattered, tiny pulmonary nodules likely related to prior granulomatous infection. Patient given prednisone 60mg  in the ED along with provided with an inhaler.  Dr. Eulis Foster has seen and evaluated this patient, she is to be discharged home on prednisone burst. Patient understands and agrees with plan. Patient stating at 100% on room air.   Final Clinical Impressions(s) / ED Diagnoses   Final diagnoses:  Shortness of breath  Bronchitis    ED Discharge Orders         Ordered    predniSONE (DELTASONE) 10 MG tablet  Daily     10/31/17 2101           Janeece Fitting, PA-C 10/31/17 2103    Daleen Bo, MD 10/31/17 2114

## 2017-10-31 NOTE — ED Provider Notes (Signed)
  Face-to-face evaluation   History: She presents for evaluation of difficulty breathing characterized by hard to take a full breath.  She also become short of breath when supine.  Is also feeling anxious.  She recently had a hip fracture repair.  Physical exam: Elderly, obese, able to stand.  She complains of left leg pain, but feels like it is improving.  Lungs with decreased air movement bilaterally no audible wheezes, rales or rhonchi.  MDM-valuation consistent with acute bronchitis related to tobacco abuse.  Pneumonia, PE or sepsis.  Medical screening examination/treatment/procedure(s) were conducted as a shared visit with non-physician practitioner(s) and myself.  I personally evaluated the patient during the encounter    Daleen Bo, MD 10/31/17 2114

## 2017-10-31 NOTE — ED Notes (Signed)
Pt ambulated to bathroom with stand by assist. Pt O2 sats remained upper 90's after ambulation.

## 2017-11-17 DIAGNOSIS — I739 Peripheral vascular disease, unspecified: Secondary | ICD-10-CM | POA: Diagnosis not present

## 2017-11-17 DIAGNOSIS — I209 Angina pectoris, unspecified: Secondary | ICD-10-CM | POA: Diagnosis not present

## 2017-11-17 DIAGNOSIS — I701 Atherosclerosis of renal artery: Secondary | ICD-10-CM | POA: Diagnosis not present

## 2017-11-17 DIAGNOSIS — I10 Essential (primary) hypertension: Secondary | ICD-10-CM | POA: Diagnosis not present

## 2017-11-21 DIAGNOSIS — G894 Chronic pain syndrome: Secondary | ICD-10-CM | POA: Diagnosis not present

## 2017-11-21 DIAGNOSIS — M47812 Spondylosis without myelopathy or radiculopathy, cervical region: Secondary | ICD-10-CM | POA: Diagnosis not present

## 2017-11-21 DIAGNOSIS — M150 Primary generalized (osteo)arthritis: Secondary | ICD-10-CM | POA: Diagnosis not present

## 2017-11-21 DIAGNOSIS — Z79891 Long term (current) use of opiate analgesic: Secondary | ICD-10-CM | POA: Diagnosis not present

## 2017-11-25 DIAGNOSIS — M25552 Pain in left hip: Secondary | ICD-10-CM | POA: Diagnosis not present

## 2017-11-27 ENCOUNTER — Inpatient Hospital Stay: Payer: Medicare Other | Attending: Oncology | Admitting: Oncology

## 2017-11-27 ENCOUNTER — Telehealth: Payer: Self-pay | Admitting: Oncology

## 2017-11-27 ENCOUNTER — Other Ambulatory Visit: Payer: Self-pay | Admitting: Cardiology

## 2017-11-27 ENCOUNTER — Inpatient Hospital Stay: Payer: Medicare Other

## 2017-11-27 ENCOUNTER — Ambulatory Visit
Admission: RE | Admit: 2017-11-27 | Discharge: 2017-11-27 | Disposition: A | Discharge status: Home / Self Care | Payer: Medicare Other | Source: Ambulatory Visit | Attending: Cardiology | Admitting: Cardiology

## 2017-11-27 VITALS — BP 138/69 | HR 85 | Temp 98.7°F | Resp 18 | Ht 61.0 in | Wt 207.7 lb

## 2017-11-27 DIAGNOSIS — D472 Monoclonal gammopathy: Secondary | ICD-10-CM

## 2017-11-27 DIAGNOSIS — R779 Abnormality of plasma protein, unspecified: Secondary | ICD-10-CM

## 2017-11-27 DIAGNOSIS — G8929 Other chronic pain: Secondary | ICD-10-CM | POA: Insufficient documentation

## 2017-11-27 DIAGNOSIS — Z79899 Other long term (current) drug therapy: Secondary | ICD-10-CM | POA: Diagnosis not present

## 2017-11-27 DIAGNOSIS — M25552 Pain in left hip: Secondary | ICD-10-CM | POA: Diagnosis not present

## 2017-11-27 DIAGNOSIS — D509 Iron deficiency anemia, unspecified: Secondary | ICD-10-CM

## 2017-11-27 DIAGNOSIS — R0602 Shortness of breath: Secondary | ICD-10-CM | POA: Diagnosis not present

## 2017-11-27 DIAGNOSIS — R0609 Other forms of dyspnea: Secondary | ICD-10-CM | POA: Diagnosis not present

## 2017-11-27 LAB — CBC WITH DIFFERENTIAL (CANCER CENTER ONLY)
Abs Immature Granulocytes: 0.01 10*3/uL (ref 0.00–0.07)
BASOS PCT: 0 %
Basophils Absolute: 0 10*3/uL (ref 0.0–0.1)
Eosinophils Absolute: 0.2 10*3/uL (ref 0.0–0.5)
Eosinophils Relative: 4 %
HCT: 32.5 % — ABNORMAL LOW (ref 36.0–46.0)
Hemoglobin: 10.2 g/dL — ABNORMAL LOW (ref 12.0–15.0)
Immature Granulocytes: 0 %
LYMPHS ABS: 1.3 10*3/uL (ref 0.7–4.0)
Lymphocytes Relative: 27 %
MCH: 28.4 pg (ref 26.0–34.0)
MCHC: 31.4 g/dL (ref 30.0–36.0)
MCV: 90.5 fL (ref 80.0–100.0)
MONOS PCT: 7 %
Monocytes Absolute: 0.4 10*3/uL (ref 0.1–1.0)
NEUTROS ABS: 3.1 10*3/uL (ref 1.7–7.7)
Neutrophils Relative %: 62 %
PLATELETS: 177 10*3/uL (ref 150–400)
RBC: 3.59 MIL/uL — ABNORMAL LOW (ref 3.87–5.11)
RDW: 14.9 % (ref 11.5–15.5)
WBC Count: 4.9 10*3/uL (ref 4.0–10.5)
nRBC: 0 % (ref 0.0–0.2)

## 2017-11-27 LAB — IRON AND TIBC
IRON: 35 ug/dL — AB (ref 41–142)
Saturation Ratios: 10 % — ABNORMAL LOW (ref 21–57)
TIBC: 354 ug/dL (ref 236–444)
UIBC: 319 ug/dL (ref 120–384)

## 2017-11-27 LAB — FERRITIN: FERRITIN: 85 ng/mL (ref 11–307)

## 2017-11-27 NOTE — Progress Notes (Signed)
Hematology and Oncology Follow Up Visit  [name] [id number] Sep 23, 1950 67 y.o. 11/27/2017 10:26 AM Tivis Ringer, Almyra Free, NP   Principle Diagnosis: 67 year old woman with:  1.  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  Anemia is related to chronic blood loss potentially oral iron absorption issues.   2.  Abnormal serum protein electrophoresis without any monoclonal gammopathy.  Immunofixation in December 2018 did not show any specific monoclonal protein.  Current therapy: Oral iron replacement.  Interim History: [name] returns today for a repeat evaluation.  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  During that time, her hemoglobin dropped from 12.9 down to 8.9 postoperatively.  She has resumed oral iron therapy since that time and her hemoglobin has improved.  She does report some fatigue and tiredness which is improving at this time.  She does report pain which is chronic in nature in her left hip.  She is able to ambulate with the help of a cane without any falls.  She does not report any headaches, blurry vision, syncope or seizures.  She denies any changes in mentation or lethargy.  Does not report any fevers, chills or sweats.  Does not report any cough, wheezing or hemoptysis.  Does not report any chest pain, palpitation, orthopnea or leg edema.  Does not report any nausea, vomiting or abdominal pain.  Does not report any changes in bowel habits.  Does not report any arthralgias or myalgias.   Does not report frequency, urgency or hematuria.  Does not report any skin rashes or lesions. Does not report any bleeding or clotting tendency.  Does not report any changes in her mood.  Remaining review of systems is negative.    Medications: I have reviewed the patient's current medications.  Current Outpatient Medications  Medication Sig Dispense Refill  . albuterol (PROVENTIL HFA;VENTOLIN HFA) 108 (90 BASE)  MCG/ACT inhaler Inhale 2 puffs into the lungs every 4 (four) hours as needed for wheezing or shortness of breath (cough). 1 Inhaler 0  . aspirin EC 81 MG tablet Take 1 tablet (81 mg total) by mouth 2 (two) times daily. (Patient taking differently: Take 81 mg by mouth daily. ) 60 tablet 0  . aspirin-sod bicarb-citric acid (ALKA-SELTZER) 325 MG TBEF tablet Take 650 mg by mouth at bedtime.     . bisoprolol (ZEBETA) 10 MG tablet Take 10 mg by mouth daily.  4  . buPROPion (ZYBAN) 150 MG 12 hr tablet Take 150 mg by mouth 2 (two) times daily.   2  . calcium carbonate (TUMS EX) 750 MG chewable tablet Chew 2 tablets by mouth 4 (four) times daily as needed for heartburn.     . Cholecalciferol (VITAMIN D-3) 5000 units TABS Take 5,000 Units by mouth daily.    . clopidogrel (PLAVIX) 75 MG tablet Take 75 mg by mouth daily after breakfast.  3  . docusate sodium (COLACE) 100 MG capsule Take 100-200 mg by mouth daily as needed (for constipation.).     Marland Kitchen DULoxetine (CYMBALTA) 60 MG capsule Take 60 mg by mouth daily after breakfast.     . erythromycin ophthalmic ointment Place 1 application into both eyes 2 (two) times daily.    . ferrous sulfate 325 (65 FE) MG tablet Take 1 tablet (325 mg total) by mouth daily with breakfast. 90 tablet 3  . hydrochlorothiazide (MICROZIDE) 12.5 MG capsule Take 12.5 mg by mouth daily.     . lansoprazole (PREVACID  24HR) 15 MG capsule Take 15 mg by mouth daily as needed (for acid reflux).    Marland Kitchen losartan-hydrochlorothiazide (HYZAAR) 100-25 MG tablet Take 1 tablet by mouth daily.    . methocarbamol (ROBAXIN) 500 MG tablet Take 1 tablet (500 mg total) by mouth every 6 (six) hours as needed for muscle spasms. (Patient taking differently: Take 500 mg by mouth See admin instructions. Take one tablet (500 mg) by mouth daily at bedtime, may also take one tablet (500 mg) during the day as needed for muscle spasms) 60 tablet 0  . nitroGLYCERIN (NITROSTAT) 0.4 MG SL tablet Place 0.4 mg under the  tongue every 5 (five) minutes x 3 doses as needed for chest pain.   1  . OVER THE COUNTER MEDICATION Apply 1 application topically as needed (joint pain). Magnilife topicall muscle rub    . oxyCODONE (OXY IR/ROXICODONE) 5 MG immediate release tablet Take 5 mg by mouth every 6 (six) hours as needed for severe pain.    Marland Kitchen oxyCODONE-acetaminophen (PERCOCET) 7.5-325 MG tablet Take 1 tablet by mouth every 4 (four) hours as needed for severe pain. 30 tablet 0  . rOPINIRole (REQUIP) 1 MG tablet Take 1 mg by mouth 3 (three) times daily.     . Tolnaftate (ABSORBINE JR EX) Apply 1 application topically 4 (four) times daily as needed (knee pain).      No current facility-administered medications for this visit.      Allergies:  Allergies  Allergen Reactions  . Bee Venom Anaphylaxis  . Gabapentin Other (See Comments)    "Bad dreams"  . Morphine And Related Other (See Comments)    Overly sedated  . Codeine Hives  . Adhesive [Tape] Other (See Comments)    Redness, Paper tape is ok  . Sulfa Antibiotics Other (See Comments)    Unknown childhood reaction  . Sulfonamide Derivatives Other (See Comments)    Unknown childhood reaction     Past Medical History, Surgical history, Social history, and Family History were reviewed and updated.  Review of Systems:    Physical Exam: Blood pressure 138/69, pulse 85, temperature 98.7 F (37.1 C), temperature source Oral, resp. rate 18, height 5\' 1"  (1.549 m), weight 207 lb 11.2 oz (94.2 kg), SpO2 100 %.   ECOG: 1   General appearance: Alert, awake without any distress. Head: Atraumatic without abnormalities Oropharynx: Without any thrush or ulcers. Eyes: No scleral icterus. Lymph nodes: No lymphadenopathy noted in the cervical, supraclavicular, or axillary nodes Heart:regular rate and rhythm, without any murmurs or gallops.   Lung: Clear to auscultation without any rhonchi, wheezes or dullness to percussion. Abdomin: Soft, nontender without any  shifting dullness or ascites. Musculoskeletal: No clubbing or cyanosis. Neurological: No motor or sensory deficits. Skin: No rashes or lesions. l.      Lab Results: Lab Results  Component Value Date   WBC 5.5 10/31/2017   HGB 9.5 (L) 10/31/2017   HCT 32.3 (L) 10/31/2017   MCV 93.1 10/31/2017   PLT 215 10/31/2017     Chemistry      Component Value Date/Time   NA 134 (L) 10/31/2017 1600   K 3.9 10/31/2017 1600   K 3.5 03/19/2011 0722   CL 103 10/31/2017 1600   CO2 23 10/31/2017 1600   BUN 12 10/31/2017 1600   CREATININE 0.89 10/31/2017 1600      Component Value Date/Time   CALCIUM 8.8 (L) 10/31/2017 1600   ALKPHOS 137 (H) 10/31/2017 1600   AST 22 10/31/2017 1600  ALT 20 10/31/2017 1600   BILITOT 0.1 (L) 10/31/2017 1600        Impression and Plan:  67 year old woman with:     1.  Anemia related to iron deficiency Herbert Pun in 2018: He has received oral iron replacement with excellent improvement in her hemoglobin with counts on October 06, 2017 up to 12.9.  She did have a decline in her hemoglobin down to 8.9 after her hip operation.  Her hemoglobin today showed improvement and she remains on oral iron therapy.  Risks and benefits of continuing this approach versus intravenous iron was discussed today.  After discussion today it is reasonable to continue with oral iron and monitor periodically.  Intravenous iron may be needed in the future if her hemoglobin does not respond.  2.    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  She had abnormal serum protein electrophoresis noted on January 09, 2017.  There is no M spike detected and her IgM level is within normal range.  Potential diagnosis of this finding was discussed today which include reactive in nature versus an indolent lymphoproliferative disorder.  Plasma cell disorder are considered less likely.  I recommended the periodic monitoring at this time.  3.  Follow-up: We will be in 8 months to follow her  progress.  15  minutes was spent with the patient face-to-face today.  More than 50% of time was dedicated to reviewing her diagnoses, management options, treatment complications and answer question regarding future plan of care.    Zola Button, MD 11/14/201910:26 AM

## 2017-11-27 NOTE — Telephone Encounter (Signed)
Gave pt avs and calendar  °

## 2017-12-10 ENCOUNTER — Encounter: Payer: Self-pay | Admitting: Internal Medicine

## 2017-12-10 ENCOUNTER — Ambulatory Visit (INDEPENDENT_AMBULATORY_CARE_PROVIDER_SITE_OTHER): Payer: Medicare Other | Admitting: Internal Medicine

## 2017-12-10 VITALS — BP 130/78 | HR 88 | Ht 61.0 in | Wt 205.0 lb

## 2017-12-10 DIAGNOSIS — R05 Cough: Secondary | ICD-10-CM | POA: Insufficient documentation

## 2017-12-10 DIAGNOSIS — R0609 Other forms of dyspnea: Secondary | ICD-10-CM | POA: Insufficient documentation

## 2017-12-10 DIAGNOSIS — R058 Other specified cough: Secondary | ICD-10-CM

## 2017-12-10 DIAGNOSIS — F17210 Nicotine dependence, cigarettes, uncomplicated: Secondary | ICD-10-CM

## 2017-12-10 MED ORDER — OXYCODONE HCL 5 MG PO TABS: 5.0000 mg | ORAL_TABLET | ORAL | 0 refills | Status: DC | PRN

## 2017-12-10 MED ORDER — PANTOPRAZOLE SODIUM 40 MG PO TBEC: DELAYED_RELEASE_TABLET | ORAL | 2 refills | Status: DC

## 2017-12-10 NOTE — Assessment & Plan Note (Addendum)
Body mass index is 38.73 kg/m.   Lab Results  Component Value Date   TSH 1.580 08/24/2012     Contributing to gerd risk/ doe/reviewed the need and the process to achieve and maintain neg calorie balance > defer f/u primary care including intermittently monitoring thyroid status      Total time devoted to counseling  > 50 % of initial 60 min office visit:  review case with pt/ discussion of options/alternatives/ personally creating written customized instructions  in presence of pt  then going over those specific  Instructions directly with the pt including how to use all of the meds but in particular covering each new medication in detail and the difference between the maintenance= "automatic" meds and the prns using an action plan format for the latter (If this problem/symptom => do that organization reading Left to right).  Please see AVS from this visit for a full list of these instructions which I personally wrote for this pt and  are unique to this visit.   See observed walking study which extended face to face time for this visit

## 2017-12-10 NOTE — Progress Notes (Signed)
[name], female    DOB: 10/09/1950, 66 y.o.   MRN: [id number]    Brief patient profile:  67 yo obese wf active smoker with onset cough x 2017 and worsening sob x 09/2017 neg resp to saba so referred to pulmonary clinic 12/10/2017 by Dr   Nadyne Coombes with nl spirometry on initial eval  12/10/2017  Pulmonary/ 1st office eval/Alysia Scism  Chief Complaint  Patient presents with  . Pulmonary Consult    Self referral. Pt c/o SOB x 3 wks. She states she can be SOB at rest or with exertion. She states she has "always had a cough"- currently prod with clear to light yellow sputum. She is using her albuterol inhaler 3-4 x per day.    Dyspnea:  Room to room x 2 months, worse since started having orthopedic limitations and wt gain  Cough: harsh barking quality day and noct with minimal sputum assoc with over hb/ nausea some better with tums/ alka seltzer  Sleep: 45 degrees HOB  SABA use: afraid to take as ? Made her worse?    No obvious day to day or daytime variability or assoc  mucus plugs or hemoptysis or cp or chest tightness, subjective wheeze or overt sinus  symptoms.     Also denies any obvious fluctuation of symptoms with weather or environmental changes or other aggravating or alleviating factors except as outlined above   No unusual exposure hx or h/o childhood pna/ asthma or knowledge of premature birth.  Current Allergies, Complete Past Medical History, Past Surgical History, Family History, and Social History were reviewed in [employer] record.  ROS  The following are not active complaints unless bolded Hoarseness, sore throat, dysphagia, dental problems, itching, sneezing,  nasal congestion or discharge of excess mucus or purulent secretions, ear ache,   fever, chills, sweats, unintended wt loss or wt gain, classically pleuritic or exertional cp,  orthopnea pnd or arm/hand swelling  or leg swelling, presyncope, palpitations, abdominal pain, anorexia, nausea, vomiting,  diarrhea  or change in bowel habits or change in bladder habits, change in stools or change in urine, dysuria, hematuria,  rash, arthralgias, visual complaints, headache, numbness, weakness or ataxia or problems with walking or coordination,  change in mood or  memory.           Past Medical History:  Diagnosis Date  . Anxiety   . Aortic atherosclerosis ([place])   . Bilateral cataracts   . Carpal tunnel syndrome   . Cervical cancer ([place])   . Chronic back pain   . Claudication ([place])   . COPD (chronic obstructive pulmonary disease) ([facility])    wheezing  . DDD (degenerative disc disease), cervical   . DDD (degenerative disc disease), lumbar   . Depression   . Diverticulosis   . Gastroparesis   . GERD (gastroesophageal reflux disease)   . Hearing loss    Bilateral  . History of blood transfusion   . History of colon polyps   . History of migraine   . Hypertension   . Internal hemorrhoids   . Iron deficiency anemia   . Left-sided weakness   . Leg swelling   . Liver disease    pt unaware  . Lung nodule    several small  . OA (osteoarthritis)    both hands  . Positive ANA (antinuclear antibody)   . Pre-diabetes   . Raynaud's phenomenon   . Restless leg   . Seizures ([place])    no meds  for 8 months  . Sleep apnea    does not use her cpap  . Spondylosis   . Stroke [facility]) 2015   no weakness  . 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   rt  . Urge incontinence of urine   . Varicose vein of leg   . Venous insufficiency     Outpatient Medications Prior to Visit  Medication Sig Dispense Refill  . albuterol (PROVENTIL HFA;VENTOLIN HFA) 108 (90 BASE) MCG/ACT inhaler Inhale 2 puffs into the lungs every 4 (four) hours as needed for wheezing or shortness of breath (cough). 1 Inhaler 0  . aspirin EC 81 MG tablet Take 1 tablet (81 mg total) by mouth 2 (two) times daily. (Patient taking differently: Take 81 mg by mouth daily. ) 60 tablet 0  . aspirin-sod bicarb-citric acid (ALKA-SELTZER) 325 MG TBEF  tablet Take 650 mg by mouth at bedtime.     . bisoprolol (ZEBETA) 10 MG tablet Take 10 mg by mouth daily.  4  . buPROPion (ZYBAN) 150 MG 12 hr tablet Take 150 mg by mouth 2 (two) times daily.   2  . calcium carbonate (TUMS EX) 750 MG chewable tablet Chew 2 tablets by mouth 4 (four) times daily as needed for heartburn.     . Cholecalciferol (VITAMIN D-3) 5000 units TABS Take 5,000 Units by mouth daily.    . clopidogrel (PLAVIX) 75 MG tablet Take 75 mg by mouth daily after breakfast.  3  . docusate sodium (COLACE) 100 MG capsule Take 100-200 mg by mouth daily as needed (for constipation.).     Marland Kitchen DULoxetine (CYMBALTA) 60 MG capsule Take 60 mg by mouth daily after breakfast.     . ferrous sulfate 325 (65 FE) MG tablet Take 1 tablet (325 mg total) by mouth daily with breakfast. 90 tablet 3  . hydrochlorothiazide (MICROZIDE) 12.5 MG capsule Take 12.5 mg by mouth daily.     . lansoprazole (PREVACID 24HR) 15 MG capsule Take 15 mg by mouth daily as needed (for acid reflux).    Marland Kitchen losartan-hydrochlorothiazide (HYZAAR) 100-25 MG tablet Take 1 tablet by mouth daily.    . methocarbamol (ROBAXIN) 500 MG tablet Take 1 tablet (500 mg total) by mouth every 6 (six) hours as needed for muscle spasms. (Patient taking differently: Take 500 mg by mouth See admin instructions. Take one tablet (500 mg) by mouth daily at bedtime, may also take one tablet (500 mg) during the day as needed for muscle spasms) 60 tablet 0  . nitroGLYCERIN (NITROSTAT) 0.4 MG SL tablet Place 0.4 mg under the tongue every 5 (five) minutes x 3 doses as needed for chest pain.   1  . OVER THE COUNTER MEDICATION Apply 1 application topically as needed (joint pain). Magnilife topicall muscle rub    . oxyCODONE (OXY IR/ROXICODONE) 5 MG immediate release tablet Take 5 mg by mouth every 6 (six) hours as needed for severe pain.    Marland Kitchen oxyCODONE-acetaminophen (PERCOCET) 7.5-325 MG tablet Take 1 tablet by mouth every 4 (four) hours as needed for severe pain. 30  tablet 0  . rOPINIRole (REQUIP) 1 MG tablet Take 1 mg by mouth 3 (three) times daily.     . Tolnaftate (ABSORBINE JR EX) Apply 1 application topically 4 (four) times daily as needed (knee pain).     Marland Kitchen erythromycin ophthalmic ointment Place 1 application into both eyes 2 (two) times daily.     No facility-administered medications prior to visit.      Objective:  BP 130/78 (BP Location: Left Arm, Cuff Size: Normal)   Pulse 88   Ht 5\' 1"  (1.549 m)   Wt 205 lb (93 kg)   SpO2 96%   BMI 38.73 kg/m   SpO2: 96 %  RA  Wt Readings from Last 3 Encounters:  12/10/17 205 lb (93 kg)  11/27/17 207 lb 11.2 oz (94.2 kg)  10/31/17 207 lb 3.7 oz (94 kg)       amb obese wf walks with cane, very pleasant but extremely evasive historian/ iffy on details of care, names of meds "but I'm on too many"      HEENT: Upper denture/ nl  turbinates bilaterally, and oropharynx. Nl external ear canals without cough reflex Modified Mallampati Score =   1   NECK :  without JVD/Nodes/TM/ nl carotid upstrokes bilaterally   LUNGS: no acc muscle use,  Nl contour chest with mostly upper wheezing bilaterally resolves with plm without cough on insp or exp maneuvers   CV:  RRR  no s3 or murmur or increase in P2, and no edema   ABD: quite obese but  soft and nontender with limited inspiratory excursion in the supine position. No bruits or organomegaly appreciated, bowel sounds nl  MS:  Nl gait/ ext warm without deformities, calf tenderness, cyanosis or clubbing No obvious joint restrictions   SKIN: warm and dry without lesions    NEURO:  alert, approp, nl sensorium with  no motor or cerebellar deficits apparent.       I personally reviewed images and agree with radiology impression as follows:  CXR:   11/27/17 No active cardiopulmonary disease    I personally reviewed images and agree with radiology impression as follows:   Chest CTa 10/31/17 1. No pulmonary embolus or other acute thoracic  abnormality. 2. Coronary artery and aortic atherosclerosis (ICD10-I70.0). 3. Unchanged appearance of scattered, tiny pulmonary nodules likely related to prior granulomatous infection.  Labs ordered/ reviewed:      Chemistry      Component Value Date/Time   NA 134 (L) 10/31/2017 1600   K 3.9 10/31/2017 1600   K 3.5 03/19/2011 0722   CL 103 10/31/2017 1600   CO2 23 10/31/2017 1600   BUN 12 10/31/2017 1600   CREATININE 0.89 10/31/2017 1600      Component Value Date/Time   CALCIUM 8.8 (L) 10/31/2017 1600   ALKPHOS 137 (H) 10/31/2017 1600   AST 22 10/31/2017 1600   ALT 20 10/31/2017 1600   BILITOT 0.1 (L) 10/31/2017 1600        Lab Results  Component Value Date   WBC 4.9 11/27/2017   HGB 10.2 (L) 11/27/2017   HCT 32.5 (L) 11/27/2017   MCV 90.5 11/27/2017   PLT 177 11/27/2017      Eos                                                                 0.2             12/10/2017     Lab Results  Component Value Date   TSH 1.580 08/24/2012       Assessment   DOE (dyspnea on exertion) Echo 69/6/29 Grade 1 diastolic dysfunction / 1+ MR , unable to estimate PAS  Spirometry  12/10/2017  FEV1 1.7  (84%)  Ratio 82 s curvature off all rx  - 12/10/2017   Walked RA x one lap @ 210 ft  stopped due to  Sob/ leg pain slow pace,no desats   Symptoms are markedly disproportionate to objective findings and not clear to what extent this is actually a pulmonary  problem but pt does appear to have difficult to sort out respiratory symptoms of unknown origin for which  DDX  = almost all start with A and  include Adherence, Ace Inhibitors, Acid Reflux, Active Sinus Disease, Alpha 1 Antitripsin deficiency, Anxiety masquerading as Airways dz,  ABPA,  Allergy(esp in young), Aspiration (esp in elderly), Adverse effects of meds,  Active smoking or Vaping, A bunch of PE's/clot burden (a few small clots can't cause this syndrome unless there is already severe underlying pulm or vascular dz with poor  reserve),  Anemia or thyroid disorder, plus two Bs  = Bronchiectasis and Beta blocker use..and one C= CHF     Adherence is always the initial "prime suspect" and is a multilayered concern that requires a "trust but verify" approach in every patient - starting with knowing how to use medications, especially inhalers, correctly, keeping up with refills and understanding the fundamental difference between maintenance and prns vs those medications only taken for a very short course and then stopped and not refilled.  - return with all meds in hand using a trust but verify approach to confirm accurate Medication  Reconciliation The principal here is that until we are certain that the  patients are doing what we've asked, it makes no sense to ask them to do more.   ? Acid (or non-acid) GERD > always difficult to exclude as up to 75% of pts in some series report no assoc GI/ Heartburn symptoms> rec max (24h)  acid suppression and diet restrictions/ reviewed and instructions given in writing.  - see UACS a/p  Active smoking not helping > see sep a/p  ? Allergy / asthma >  Nothing really to suggest but prednisone short course is reasonable and has saba prn she can always rechallenge with (prio intol rules against asthma and for uacs - see sep a/p)   ? Anxiety > usually at the bottom of this list of usual suspects but should be much higher on this pt's based on H and P and note already on psychotropics and may interfere with adherence and also interpretation of response or lack thereof to symptom management which can be quite subjective.   ? A bunch of PE's > already excluded on CTa 10/31/17   ? Anemia/ thyroid dz  >  Fu PCP as no recent tsh on record  ? CHF > w/u in progress by Dr Nadyne Coombes but clinically doubt and certainly would not explain the prominent barking quality cough   Upper airway cough syndrome Upper airway cough syndrome (previously labeled PNDS),  is so named because it's frequently  impossible to sort out how much is  CR/sinusitis with freq throat clearing (which can be related to primary GERD)   vs  causing  secondary (" extra esophageal")  GERD from wide swings in gastric pressure that occur with throat clearing, often  promoting self use of mint and menthol lozenges that reduce the lower esophageal sphincter tone and exacerbate the problem further in a cyclical fashion.   These are the same pts (now being labeled as having "irritable larynx syndrome" by some cough centers) who not infrequently have a history  of having failed to tolerate ace inhibitors,  dry powder inhalers or biphosphonates or report having atypical/extraesophageal reflux symptoms that don't respond to standard doses of PPI  and are easily confused as having aecopd or asthma flares by even experienced allergists/ pulmonologists (myself included).   - Of the three most common causes of  Sub-acute / recurrent or chronic cough, only one (GERD)  can actually contribute to/ trigger  the other two (asthma and post nasal drip syndrome)  and perpetuate the cylce of cough.  While not intuitively obvious, many patients with chronic low grade reflux do not cough until there is a primary insult that disturbs the protective epithelial barrier and exposes sensitive nerve endings.   This is typically viral but can due to PNDS and  either may apply here.     >>>>   The point is that once this occurs, it is difficult to eliminate the cycle  using anything but a maximally effective acid suppression regimen at least in the short run, accompanied by an appropriate diet to address non acid GERD and control / eliminate the cough itself for at least 3 days with oxyir 5 mg up to 2 every 4 hours and take the short course of prednisone she already has purchased but hasn't started for any T2 driven component of the upper airway instability she demonstrated today  Cigarette smoker 4-5 min discussion re active cigarette smoking in addition  to office E&M  Ask about tobacco use:   ongoing Advise quitting   I emphasized that although we never turn away smokers from the pulmonary clinic, we do ask that they understand that the recommendations that we make  won't work nearly as well in the presence of continued cigarette exposure. In fact, we may very well  reach a point where we can't promise to help the patient if he/she can't quit smoking. (We can and will promise to try to help, we just can't promise what we recommend will really work)  Assess willingness:  Not committed at this point Assist in quit attempt:  Per PCP when ready Arrange follow up:   Follow up per Primary Care planned        Morbid obesity due to excess calories ([place]) complicted by hbp/osa / djd and prob diastolic dysfunction  Body mass index is 38.73 kg/m.    Lab Results  Component Value Date   TSH 1.580 08/24/2012     Contributing to gerd risk/ doe/reviewed the need and the process to achieve and maintain neg calorie balance > defer f/u primary care including intermittently monitoring thyroid status      Total time devoted to counseling  > 50 % of initial 60 min office visit:  review case with pt/ discussion of options/alternatives/ personally creating written customized instructions  in presence of pt  then going over those specific  Instructions directly with the pt including how to use all of the meds but in particular covering each new medication in detail and the difference between the maintenance= "automatic" meds and the prns using an action plan format for the latter (If this problem/symptom => do that organization reading Left to right).  Please see AVS from this visit for a full list of these instructions which I personally wrote for this pt and  are unique to this visit.   See observed walking study which extended face to face time for this visit      Christinia Gully, MD 12/10/2017

## 2017-12-10 NOTE — Assessment & Plan Note (Signed)

## 2017-12-10 NOTE — Patient Instructions (Addendum)
Stop prevacid, alkaselzer, tums and all cough drops and chocolate  Take the prednisone you have been prescribed   Stop all smoking    Take Mucinex dm 1200 mg every 12 hours and supplement if needed with  Oxy-IR 5 mg up to 2 every 4 hours to suppress the urge to cough. Swallowing water and/or using ice chips/non mint and menthol containing candies (such as lifesavers or sugarless jolly ranchers) are also effective.  You should rest your voice and avoid activities that you know make you cough.  Once you have eliminated the cough for 3 straight days try reducing the  Oxy-IR first,  then the delsym as tolerated.     Protonix 40 mg Take 30- 60 min before your first and last meals of the day    GERD (REFLUX)  is an extremely common cause of respiratory symptoms just like yours , many times with no obvious heartburn at all.    It can be treated with medication, but also with lifestyle changes including elevation of the head of your bed (ideally with 6 inch  bed blocks),  Smoking cessation, avoidance of late meals, excessive alcohol, and avoid fatty foods, chocolate, peppermint, colas, red wine, and acidic juices such as orange juice.  NO MINT OR MENTHOL PRODUCTS SO NO COUGH DROPS   USE SUGARLESS CANDY INSTEAD (Jolley ranchers or Stover's or Life Savers) or even ice chips will also do - the key is to swallow to prevent all throat clearing. NO OIL BASED VITAMINS - use powdered substitutes.   Please schedule a follow up office visit in 2  weeks, call sooner if needed with all medications /inhalers/ solutions in hand so we can verify exactly what you are taking. This includes all medications from all doctors and over the [place] separate them into two bags:  the ones you take automatically, no matter what, vs the ones you take just when you feel you need them "BAG #2 is UP TO YOU"  - this will really help [place] help you take your medications more effectively.

## 2017-12-10 NOTE — Assessment & Plan Note (Addendum)
Echo 81/8/56 Grade 1 diastolic dysfunction / 1+ MR , unable to estimate PAS  Spirometry 12/10/2017  FEV1 1.7  (84%)  Ratio 82 s curvature off all rx  - 12/10/2017   Walked RA x one lap @ 210 ft  stopped due to  Sob/ leg pain slow pace,no desats   Symptoms are markedly disproportionate to objective findings and not clear to what extent this is actually a pulmonary  problem but pt does appear to have difficult to sort out respiratory symptoms of unknown origin for which  DDX  = almost all start with A and  include Adherence, Ace Inhibitors, Acid Reflux, Active Sinus Disease, Alpha 1 Antitripsin deficiency, Anxiety masquerading as Airways dz,  ABPA,  Allergy(esp in young), Aspiration (esp in elderly), Adverse effects of meds,  Active smoking or Vaping, A bunch of PE's/clot burden (a few small clots can't cause this syndrome unless there is already severe underlying pulm or vascular dz with poor reserve),  Anemia or thyroid disorder, plus two Bs  = Bronchiectasis and Beta blocker use..and one C= CHF     Adherence is always the initial "prime suspect" and is a multilayered concern that requires a "trust but verify" approach in every patient - starting with knowing how to use medications, especially inhalers, correctly, keeping up with refills and understanding the fundamental difference between maintenance and prns vs those medications only taken for a very short course and then stopped and not refilled.  - return with all meds in hand using a trust but verify approach to confirm accurate Medication  Reconciliation The principal here is that until we are certain that the  patients are doing what we've asked, it makes no sense to ask them to do more.   ? Acid (or non-acid) GERD > always difficult to exclude as up to 75% of pts in some series report no assoc GI/ Heartburn symptoms> rec max (24h)  acid suppression and diet restrictions/ reviewed and instructions given in writing.  - see UACS a/p  Active  smoking not helping > see sep a/p  ? Allergy / asthma >  Nothing really to suggest but prednisone short course is reasonable and has saba prn she can always rechallenge with (prio intol rules against asthma and for uacs - see sep a/p)   ? Anxiety > usually at the bottom of this list of usual suspects but should be much higher on this pt's based on H and P and note already on psychotropics and may interfere with adherence and also interpretation of response or lack thereof to symptom management which can be quite subjective.   ? A bunch of PE's > already excluded on CTa 10/31/17   ? Anemia/ thyroid dz  >  Fu PCP as no recent tsh on record  ? CHF > w/u in progress by Dr Nadyne Coombes but clinically doubt and certainly would not explain the prominent barking quality cough

## 2017-12-10 NOTE — Assessment & Plan Note (Signed)
Upper airway cough syndrome (previously labeled PNDS),  is so named because it's frequently impossible to sort out how much is  CR/sinusitis with freq throat clearing (which can be related to primary GERD)   vs  causing  secondary (" extra esophageal")  GERD from wide swings in gastric pressure that occur with throat clearing, often  promoting self use of mint and menthol lozenges that reduce the lower esophageal sphincter tone and exacerbate the problem further in a cyclical fashion.   These are the same pts (now being labeled as having "irritable larynx syndrome" by some cough centers) who not infrequently have a history of having failed to tolerate ace inhibitors,  dry powder inhalers or biphosphonates or report having atypical/extraesophageal reflux symptoms that don't respond to standard doses of PPI  and are easily confused as having aecopd or asthma flares by even experienced allergists/ pulmonologists (myself included).   - Of the three most common causes of  Sub-acute / recurrent or chronic cough, only one (GERD)  can actually contribute to/ trigger  the other two (asthma and post nasal drip syndrome)  and perpetuate the cylce of cough.  While not intuitively obvious, many patients with chronic low grade reflux do not cough until there is a primary insult that disturbs the protective epithelial barrier and exposes sensitive nerve endings.   This is typically viral but can due to PNDS and  either may apply here.     >>>>   The point is that once this occurs, it is difficult to eliminate the cycle  using anything but a maximally effective acid suppression regimen at least in the short run, accompanied by an appropriate diet to address non acid GERD and control / eliminate the cough itself for at least 3 days with oxyir 5 mg up to 2 every 4 hours and take the short course of prednisone she already has purchased but hasn't started for any T2 driven component of the upper airway instability she  demonstrated today

## 2017-12-15 DIAGNOSIS — N390 Urinary tract infection, site not specified: Secondary | ICD-10-CM | POA: Diagnosis not present

## 2017-12-15 DIAGNOSIS — R300 Dysuria: Secondary | ICD-10-CM | POA: Diagnosis not present

## 2017-12-15 DIAGNOSIS — H9202 Otalgia, left ear: Secondary | ICD-10-CM | POA: Diagnosis not present

## 2017-12-22 DIAGNOSIS — M150 Primary generalized (osteo)arthritis: Secondary | ICD-10-CM | POA: Diagnosis not present

## 2017-12-22 DIAGNOSIS — G894 Chronic pain syndrome: Secondary | ICD-10-CM | POA: Diagnosis not present

## 2017-12-22 DIAGNOSIS — M47812 Spondylosis without myelopathy or radiculopathy, cervical region: Secondary | ICD-10-CM | POA: Diagnosis not present

## 2017-12-22 DIAGNOSIS — Z79891 Long term (current) use of opiate analgesic: Secondary | ICD-10-CM | POA: Diagnosis not present

## 2017-12-23 DIAGNOSIS — H40013 Open angle with borderline findings, low risk, bilateral: Secondary | ICD-10-CM | POA: Diagnosis not present

## 2017-12-25 ENCOUNTER — Encounter: Payer: Self-pay | Admitting: Internal Medicine

## 2017-12-25 ENCOUNTER — Ambulatory Visit (INDEPENDENT_AMBULATORY_CARE_PROVIDER_SITE_OTHER): Payer: Medicare Other | Admitting: Internal Medicine

## 2017-12-25 DIAGNOSIS — F17210 Nicotine dependence, cigarettes, uncomplicated: Secondary | ICD-10-CM | POA: Diagnosis not present

## 2017-12-25 DIAGNOSIS — R0609 Other forms of dyspnea: Secondary | ICD-10-CM | POA: Diagnosis not present

## 2017-12-25 DIAGNOSIS — R05 Cough: Secondary | ICD-10-CM

## 2017-12-25 DIAGNOSIS — R058 Other specified cough: Secondary | ICD-10-CM

## 2017-12-25 MED ORDER — ALBUTEROL SULFATE HFA 108 (90 BASE) MCG/ACT IN AERS: 2.0000 | INHALATION_SPRAY | RESPIRATORY_TRACT | 1 refills | Status: DC | PRN

## 2017-12-25 NOTE — Progress Notes (Signed)
[name], female    DOB: December 17, 1950, 67 y.o.   MRN: [id number]    Brief patient profile:  89 yowf  obese  active smoker with onset cough x 2017 and worsening sob x 09/2017 neg resp to saba so referred to pulmonary clinic 12/10/2017 by Dr   Nadyne Coombes with nl spirometry on initial eval    History of Present Illness  12/10/2017  Pulmonary/ 1st office eval/[name]  Chief Complaint  Patient presents with  . Pulmonary Consult    Self referral. Pt c/o SOB x 3 wks. She states she can be SOB at rest or with exertion. She states she has "always had a cough"- currently prod with clear to light yellow sputum. She is using her albuterol inhaler 3-4 x per day.    Dyspnea:  Room to room x 2 months, worse since started having orthopedic limitations and wt gain  Cough: harsh barking quality day and noct with minimal sputum assoc with over hb/ nausea some better with tums/ alka seltzer  Sleep: 45 degrees HOB electric bed  SABA use: afraid to take as ? Made her worse?  rec Stop prevacid, alkaselzer, tums and all cough drops and chocolate Take the prednisone you have been prescribed  Stop all smoking  Take Mucinex dm 1200 mg every 12 hours and supplement if needed with  Oxy-IR 5 mg up to 2 every 4 hours to suppress the urge to cough. . Once you have eliminated the cough for 3 straight days try reducing the  Oxy-IR first,  then the delsym as tolerated.   Protonix 40 mg Take 30- 60 min before your first and last meals of the day  GERD rx  Please schedule a follow up office visit in 2  weeks, call sooner if needed with all medications      12/25/2017  f/u ov/[name] re: cough/ still smoking / did not bring all meds as req  Chief Complaint  Patient presents with  . Follow-up    Cough has improved some. She states her breathing seems better. She notices most of the cough prod with clear sputum.  She is using her albuterol inhaler 2 x daily on average.    Dyspnea:  [place] shopping anywhere as long as leaning on  card  Cough: 3/7 just coughs at hs ? Better p inhaler  Sleeping:  20 years x 30 degrees and ends up close to flat by am s flare  SABA use: mostly  02: none / not using cpap    No obvious other patterns in day to day or daytime variability or assoc  purulent sputum or mucus plugs or hemoptysis or cp or chest tightness, subjective wheeze or overt sinus or hb symptoms.    Also denies any obvious fluctuation of symptoms with weather or environmental changes or other aggravating or alleviating factors except as outlined above   No unusual exposure hx or h/o childhood pna/ asthma or knowledge of premature birth.  Current Allergies, Complete Past Medical History, Past Surgical History, Family History, and Social History were reviewed in [employer] record.  ROS  The following are not active complaints unless bolded Hoarseness, sore throat, dysphagia, dental problems, itching, sneezing,  nasal congestion or discharge of excess mucus or purulent secretions, ear ache,   fever, chills, sweats, unintended wt loss or wt gain, classically pleuritic or exertional cp,  orthopnea pnd or arm/hand swelling  or leg swelling, presyncope, palpitations, abdominal pain, anorexia, nausea, vomiting, diarrhea  or  change in bowel habits or change in bladder habits, change in stools or change in urine, dysuria, hematuria,  rash, arthralgias, visual complaints, headache, numbness, weakness or ataxia or problems with walking or coordination,  change in mood or  memory.        Current Meds  Medication Sig  . albuterol (PROVENTIL HFA;VENTOLIN HFA) 108 (90 Base) MCG/ACT inhaler Inhale 2 puffs into the lungs every 4 (four) hours as needed for wheezing or shortness of breath (cough).  Marland Kitchen aspirin EC 81 MG tablet Take 1 tablet (81 mg total) by mouth 2 (two) times daily. (Patient taking differently: Take 81 mg by mouth daily. )  . aspirin-sod bicarb-citric acid (ALKA-SELTZER) 325 MG TBEF tablet Take 650 mg by  mouth at bedtime.   . bisoprolol (ZEBETA) 10 MG tablet Take 10 mg by mouth daily.  Marland Kitchen buPROPion (ZYBAN) 150 MG 12 hr tablet Take 150 mg by mouth 2 (two) times daily.   . Cholecalciferol (VITAMIN D-3) 5000 units TABS Take 5,000 Units by mouth daily.  . clopidogrel (PLAVIX) 75 MG tablet Take 75 mg by mouth daily after breakfast.  . docusate sodium (COLACE) 100 MG capsule Take 100-200 mg by mouth daily as needed (for constipation.).   Marland Kitchen DULoxetine (CYMBALTA) 60 MG capsule Take 60 mg by mouth daily after breakfast.   . ferrous sulfate 325 (65 FE) MG tablet Take 1 tablet (325 mg total) by mouth daily with breakfast.  . hydrochlorothiazide (MICROZIDE) 12.5 MG capsule Take 12.5 mg by mouth daily.   Marland Kitchen losartan-hydrochlorothiazide (HYZAAR) 100-25 MG tablet Take 1 tablet by mouth daily.  . methocarbamol (ROBAXIN) 500 MG tablet Take 1 tablet (500 mg total) by mouth every 6 (six) hours as needed for muscle spasms. (Patient taking differently: Take 500 mg by mouth See admin instructions. Take one tablet (500 mg) by mouth daily at bedtime, may also take one tablet (500 mg) during the day as needed for muscle spasms)  . nitroGLYCERIN (NITROSTAT) 0.4 MG SL tablet Place 0.4 mg under the tongue every 5 (five) minutes x 3 doses as needed for chest pain.   Marland Kitchen OVER THE COUNTER MEDICATION Apply 1 application topically as needed (joint pain). Magnilife topicall muscle rub  . oxyCODONE (OXY IR/ROXICODONE) 5 MG immediate release tablet Take 1 tablet (5 mg total) by mouth every 4 (four) hours as needed for severe pain.  Marland Kitchen oxyCODONE-acetaminophen (PERCOCET) 7.5-325 MG tablet Take 1 tablet by mouth every 4 (four) hours as needed for severe pain.  . pantoprazole (PROTONIX) 40 MG tablet Take 30- 60 min before your first and last meals of the day  . rOPINIRole (REQUIP) 1 MG tablet Take 1 mg by mouth 3 (three) times daily.   . Tolnaftate (ABSORBINE JR EX) Apply 1 application topically 4 (four) times daily as needed (knee pain).   .                        Objective:         12/25/2017     201   12/10/17 205 lb (93 kg)  11/27/17 207 lb 11.2 oz (94.2 kg)  10/31/17 207 lb 3.7 oz (94 kg)       amb obese pleasant wf walking with cane    Vital signs reviewed - Note on arrival 02 sats  99% on RA      HEENT: upper dentures/ nl  turbinates bilaterally, and oropharynx. Nl external ear canals without cough reflex - Modified Mallampati Score =  1    NECK :  without JVD/Nodes/TM/ nl carotid upstrokes bilaterally   LUNGS: no acc muscle use,  Nl contour chest which is clear to A and P bilaterally without cough on insp or exp maneuvers - mild pseudowheeze present eliminated with plm    CV:  RRR  no s3 or murmur or increase in P2, and no edema   ABD:  soft and nontender with nl inspiratory excursion in the supine position. No bruits or organomegaly appreciated, bowel sounds nl  MS:  Nl gait/ ext warm without deformities, calf tenderness, cyanosis or clubbing No obvious joint restrictions   SKIN: warm and dry without lesions    NEURO:  alert, approp, nl sensorium with  no motor or cerebellar deficits apparent.       Assessment

## 2017-12-25 NOTE — Patient Instructions (Addendum)
For drainage / throat tickle try take CHLORPHENIRAMINE  4 mg (chlor tabs at [facility])  - take one every 4 hours as needed - available over the counter- may cause drowsiness so start with just a dose or two one hour  and see how you tolerate it before trying in daytime     No other changes in your medications    Please schedule a follow up office visit in 6 weeks, call sooner if needed with pfts - bring all your medications

## 2017-12-26 ENCOUNTER — Encounter: Payer: Self-pay | Admitting: Internal Medicine

## 2017-12-26 NOTE — Assessment & Plan Note (Signed)
Body mass index is 37.98 kg/m.  -  trending down/ encouraged Lab Results  Component Value Date   TSH 1.580 08/24/2012     Contributing to gerd risk/ doe/reviewed the need and the process to achieve and maintain neg calorie balance > defer f/u primary care including intermittently monitoring thyroid status

## 2017-12-26 NOTE — Assessment & Plan Note (Signed)
Cyclical cough rx 84/72/0721 > much better 12/25/2017 but still some cough at hs > rec 1st gen H1 blockers per guidelines    Overall much improved s any convincing evidence of asthma here despite ongoing smoking (see separate a/p)   >>> rec continue max rx for gerd/ hs h1 blockers and return in 6 weeks with full pfts to complete the w/u

## 2017-12-26 NOTE — Assessment & Plan Note (Signed)
4-5 min discussion re active cigarette smoking in addition to office E&M  Ask about tobacco use:   ongoing Advise quitting     I reviewed the Fletcher curve with the patient that basically indicates  if you quit smoking when your best day FEV1 is still well preserved (as is clearly  the case here)  it is highly unlikely you will progress to severe disease and informed the patient there was  no medication on the market that has proven to alter the curve/ its downward trajectory  or the likelihood of progression of their disease(unlike other chronic medical conditions such as atheroclerosis where we do think we can change the natural hx with risk reducing meds)    Therefore stopping smoking and maintaining abstinence are  the most important aspects of her care, not choice of inhalers or for that matter, doctors.  Assess willingness:  Not committed at this point Assist in quit attempt:  Per PCP when ready Arrange follow up:   Follow up per Primary Care planned

## 2017-12-26 NOTE — Assessment & Plan Note (Signed)
Echo 38/8/82 Grade 1 diastolic dysfunction / 1+ MR , unable to estimate PAS  Spirometry 12/10/2017  FEV1 1.7  (84%)  Ratio 82 s curvature off all rx  - 12/10/2017   Walked RA x one lap @ 210 ft  stopped due to  Sob/ leg pain slow pace,no desats   Improving with rx for uacs (see separate a/p)   Ok to continue prn saba for sob or refractory cough if already tries rx as outlined under uacs a/p

## 2017-12-29 DIAGNOSIS — I209 Angina pectoris, unspecified: Secondary | ICD-10-CM | POA: Diagnosis not present

## 2018-01-01 DIAGNOSIS — F419 Anxiety disorder, unspecified: Secondary | ICD-10-CM | POA: Diagnosis not present

## 2018-01-01 DIAGNOSIS — R197 Diarrhea, unspecified: Secondary | ICD-10-CM | POA: Diagnosis not present

## 2018-01-01 DIAGNOSIS — M25552 Pain in left hip: Secondary | ICD-10-CM | POA: Diagnosis not present

## 2018-01-01 DIAGNOSIS — F329 Major depressive disorder, single episode, unspecified: Secondary | ICD-10-CM | POA: Diagnosis not present

## 2018-01-01 DIAGNOSIS — R05 Cough: Secondary | ICD-10-CM | POA: Diagnosis not present

## 2018-01-09 DIAGNOSIS — R0789 Other chest pain: Secondary | ICD-10-CM | POA: Diagnosis not present

## 2018-01-09 DIAGNOSIS — I739 Peripheral vascular disease, unspecified: Secondary | ICD-10-CM | POA: Diagnosis not present

## 2018-01-09 DIAGNOSIS — I10 Essential (primary) hypertension: Secondary | ICD-10-CM | POA: Diagnosis not present

## 2018-01-09 DIAGNOSIS — I701 Atherosclerosis of renal artery: Secondary | ICD-10-CM | POA: Diagnosis not present

## 2018-02-05 ENCOUNTER — Other Ambulatory Visit: Payer: Self-pay | Admitting: Internal Medicine

## 2018-02-05 ENCOUNTER — Ambulatory Visit (INDEPENDENT_AMBULATORY_CARE_PROVIDER_SITE_OTHER): Payer: Medicare Other | Admitting: Licensed Clinical Social Worker

## 2018-02-05 DIAGNOSIS — R0609 Other forms of dyspnea: Principal | ICD-10-CM

## 2018-02-05 DIAGNOSIS — F341 Dysthymic disorder: Secondary | ICD-10-CM | POA: Diagnosis not present
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
## 2018-02-06 ENCOUNTER — Ambulatory Visit (INDEPENDENT_AMBULATORY_CARE_PROVIDER_SITE_OTHER): Payer: Medicare Other | Admitting: Internal Medicine

## 2018-02-06 ENCOUNTER — Encounter: Payer: Self-pay | Admitting: Internal Medicine

## 2018-02-06 VITALS — BP 104/58 | HR 108 | Ht 61.5 in | Wt 205.0 lb

## 2018-02-06 DIAGNOSIS — R0609 Other forms of dyspnea: Secondary | ICD-10-CM | POA: Diagnosis not present

## 2018-02-06 DIAGNOSIS — F17210 Nicotine dependence, cigarettes, uncomplicated: Secondary | ICD-10-CM | POA: Diagnosis not present

## 2018-02-06 DIAGNOSIS — R058 Other specified cough: Secondary | ICD-10-CM

## 2018-02-06 DIAGNOSIS — R05 Cough: Secondary | ICD-10-CM

## 2018-02-06 LAB — PULMONARY FUNCTION TEST
DL/VA % pred: 77 %
DL/VA: 3.45 ml/min/mmHg/L
DLCO UNC % PRED: 59 %
DLCO unc: 12.51 ml/min/mmHg
FEF 25-75 PRE: 1.95 L/s
FEF 25-75 Post: 2.53 L/sec
FEF2575-%CHANGE-POST: 29 %
FEF2575-%PRED-POST: 136 %
FEF2575-%PRED-PRE: 105 %
FEV1-%Change-Post: 8 %
FEV1-%PRED-POST: 93 %
FEV1-%Pred-Pre: 86 %
FEV1-PRE: 1.81 L
FEV1-Post: 1.97 L
FEV1FVC-%CHANGE-POST: 3 %
FEV1FVC-%Pred-Pre: 107 %
FEV6-%CHANGE-POST: 6 %
FEV6-%PRED-PRE: 82 %
FEV6-%Pred-Post: 87 %
FEV6-Post: 2.31 L
FEV6-Pre: 2.18 L
FEV6FVC-%Change-Post: 0 %
FEV6FVC-%Pred-Post: 104 %
FEV6FVC-%Pred-Pre: 103 %
FVC-%CHANGE-POST: 5 %
FVC-%PRED-PRE: 79 %
FVC-%Pred-Post: 84 %
FVC-POST: 2.32 L
FVC-PRE: 2.21 L
POST FEV1/FVC RATIO: 85 %
POST FEV6/FVC RATIO: 99 %
PRE FEV1/FVC RATIO: 82 %
Pre FEV6/FVC Ratio: 99 %
RV % pred: 94 %
RV: 1.92 L
TLC % pred: 88 %
TLC: 4.13 L

## 2018-02-06 NOTE — Patient Instructions (Signed)
For drainage / throat tickle try take CHLORPHENIRAMINE  4 mg ([facility]ortabs at [facility])- take one every 4 hours as needed - available over the counter- may cause drowsiness so start with just a bedtime dose or two and see how you tolerate it before trying in daytime    If you are satisfied with your treatment plan,  let your doctor know and he/she can either refill your medications or you can return here when your prescription runs out.     If in any way you are not 100% satisfied,  please tell us.  If 100% better, tell your friends!  Pulmonary follow up is as needed

## 2018-02-06 NOTE — Progress Notes (Signed)
[name], female    DOB: 11-24-1950   MRN: [id number]    Brief patient profile:  22 yowf  obese  active smoker with onset cough x 2017 and worsening sob x 09/2017 neg resp to saba so referred to pulmonary clinic 12/10/2017 by Dr   Nadyne Coombes with nl spirometry on initial eval    History of Present Illness  12/10/2017  Pulmonary/ 1st office eval/Felicidad Sugarman  Chief Complaint  Patient presents with  . Pulmonary Consult    Self referral. Pt c/o SOB x 3 wks. She states she can be SOB at rest or with exertion. She states she has "always had a cough"- currently prod with clear to light yellow sputum. She is using her albuterol inhaler 3-4 x per day.    Dyspnea:  Room to room x 2 months, worse since started having orthopedic limitations and wt gain  Cough: harsh barking quality day and noct with minimal sputum assoc with over hb/ nausea some better with tums/ alka seltzer  Sleep: 45 degrees HOB electric bed  SABA use: afraid to take as ? Made her worse?  rec Stop prevacid, alkaselzer, tums and all cough drops and chocolate Take the prednisone you have been prescribed  Stop all smoking  Take Mucinex dm 1200 mg every 12 hours and supplement if needed with  Oxy-IR 5 mg up to 2 every 4 hours to suppress the urge to cough. . Once you have eliminated the cough for 3 straight days try reducing the  Oxy-IR first,  then the delsym as tolerated.   Protonix 40 mg Take 30- 60 min before your first and last meals of the day  GERD rx  Please schedule a follow up office visit in 2  weeks, call sooner if needed with all medications      12/25/2017  f/u ov/Treacy Holcomb re: cough/ still smoking / did not bring all meds as req  Chief Complaint  Patient presents with  . Follow-up    Cough has improved some. She states her breathing seems better. She notices most of the cough prod with clear sputum.  She is using her albuterol inhaler 2 x daily on average.    Dyspnea:  Ok shopping anywhere as long as leaning on  cart Cough: 3/7 just coughs at hs ? Better p inhaler  Sleeping:  20 years x 30 degrees and ends up close to flat by am s flare  SABA use: mostly  02: none / not using cpap  rec For drainage / throat tickle try take CHLORPHENIRAMINE  4 mg (chlor tabs at [facility])  - take one every 4 hours as needed -    02/06/2018  f/u ov/Connery Shiffler re:  Cb/ still smoking with typical smoker's rattle  Chief Complaint  Patient presents with  . Follow-up    PFT done today. Breathing is doing well. She still has some cougg- prod with white sputum. She is using her albuterol inhaler maybe 2 x per wk.  Dyspnea:  Still shopping s limitation in terms of limiting sob  Cough: congested rattling at hs assoc with pnds Sleeping: 30 degrees  SABA use: rarely 02:  None    No obvious day to day or daytime variability or assoc   purulent sputum or mucus plugs or hemoptysis or cp or chest tightness, subjective wheeze or overt sinus or hb symptoms.   Sleeping as above without nocturnal  or early am exacerbation  of respiratory  c/o's or need for noct saba. Also  denies any obvious fluctuation of symptoms with weather or environmental changes or other aggravating or alleviating factors except as outlined above   No unusual exposure hx or h/o childhood pna/ asthma or knowledge of premature birth.  Current Allergies, Complete Past Medical History, Past Surgical History, Family History, and Social History were reviewed in [employer] record.  ROS  The following are not active complaints unless bolded Hoarseness, sore throat, dysphagia, dental problems, itching, sneezing,  nasal congestion or discharge of excess mucus or purulent secretions, ear ache,   fever, chills, sweats, unintended wt loss or wt gain, classically pleuritic or exertional cp,  orthopnea pnd or arm/hand swelling  or leg swelling, presyncope, palpitations, abdominal pain, anorexia, nausea, vomiting, diarrhea  or change in bowel habits or  change in bladder habits, change in stools or change in urine, dysuria, hematuria,  rash, arthralgias, visual complaints, headache, numbness, weakness or ataxia or problems with walking or coordination,  change in mood or  memory.        Current Meds  Medication Sig  . albuterol (PROVENTIL HFA;VENTOLIN HFA) 108 (90 Base) MCG/ACT inhaler Inhale 2 puffs into the lungs every 4 (four) hours as needed for wheezing or shortness of breath (cough).  Marland Kitchen aspirin EC 81 MG tablet Take 1 tablet (81 mg total) by mouth 2 (two) times daily. (Patient taking differently: Take 81 mg by mouth daily. )  . atorvastatin (LIPITOR) 10 MG tablet Take 10 mg by mouth daily.  . bisoprolol (ZEBETA) 10 MG tablet Take 10 mg by mouth daily.  . Cholecalciferol (VITAMIN D-3) 5000 units TABS Take 5,000 Units by mouth daily.  Marland Kitchen docusate sodium (COLACE) 100 MG capsule Take 100-200 mg by mouth daily as needed (for constipation.).   Marland Kitchen DULoxetine (CYMBALTA) 60 MG capsule Take 60 mg by mouth daily after breakfast.   . ferrous sulfate 325 (65 FE) MG tablet Take 1 tablet (325 mg total) by mouth daily with breakfast.  . losartan-hydrochlorothiazide (HYZAAR) 100-25 MG tablet Take 1 tablet by mouth daily.  . methocarbamol (ROBAXIN) 500 MG tablet Take 1 tablet (500 mg total) by mouth every 6 (six) hours as needed for muscle spasms. (Patient taking differently: Take 500 mg by mouth See admin instructions. Take one tablet (500 mg) by mouth daily at bedtime, may also take one tablet (500 mg) during the day as needed for muscle spasms)  . nitroGLYCERIN (NITROSTAT) 0.4 MG SL tablet Place 0.4 mg under the tongue every 5 (five) minutes x 3 doses as needed for chest pain.   Marland Kitchen omeprazole (PRILOSEC) 40 MG capsule Take 40 mg by mouth daily.  Marland Kitchen OVER THE COUNTER MEDICATION Apply 1 application topically as needed (joint pain). Magnilife topicall muscle rub  . oxyCODONE-acetaminophen (PERCOCET) 7.5-325 MG tablet Take 1 tablet by mouth every 4 (four) hours as  needed for severe pain.  . pantoprazole (PROTONIX) 40 MG tablet Take 30- 60 min before your first and last meals of the day  . rOPINIRole (REQUIP) 1 MG tablet Take 1 mg by mouth 3 (three) times daily.   . Tolnaftate (ABSORBINE JR EX) Apply 1 application topically 4 (four) times daily as needed (knee pain).           Objective:       02/06/2018        205   12/25/2017     201   12/10/17 205 lb (93 kg)  11/27/17 207 lb 11.2 oz (94.2 kg)  10/31/17 207 lb 3.7 oz (94  kg)      Amb obese wf no longer walking with cane    Vital signs reviewed - Note on arrival 02 sats  96 % on RA        HEENT: Top dentures/ nl  turbinates bilaterally, and oropharynx. Nl external ear canals without cough reflex   NECK :  without JVD/Nodes/TM/ nl carotid upstrokes bilaterally   LUNGS: no acc muscle use,  Nl contour chest which is clear to A and P bilaterally without cough on insp or exp maneuvers   CV:  RRR  no s3 or murmur or increase in P2, and no edema   ABD:  soft and nontender with nl inspiratory excursion in the supine position. No bruits or organomegaly appreciated, bowel sounds nl  MS:  Nl gait/ ext warm without deformities, calf tenderness, cyanosis or clubbing No obvious joint restrictions   SKIN: warm and dry without lesions    NEURO:  alert, approp, nl sensorium with  no motor or cerebellar deficits apparent.         Assessment

## 2018-02-06 NOTE — Progress Notes (Signed)
PFT done today. 

## 2018-02-07 ENCOUNTER — Encounter: Payer: Self-pay | Admitting: Internal Medicine

## 2018-02-07 NOTE — Assessment & Plan Note (Addendum)
Counseled re importance of smoking cessation but did not meet time criteria for separate billing   °

## 2018-02-07 NOTE — Assessment & Plan Note (Signed)
Cyclical cough rx 70/96/2836 > much better 12/25/2017 but still some cough at hs   Likely combined with CB from smoking - has not tried 1st gen H1 blockers per guidelines  And suggested again that she do so and f/u here prn   I had an extended discussion with the patient reviewing all relevant studies completed to date and  lasting 15 to 20 minutes of a 25 minute visit    Each maintenance medication was reviewed in detail including most importantly the difference between maintenance and prns and under what circumstances the prns are to be triggered using an action plan format that is not reflected in the computer generated alphabetically organized AVS.     Please see AVS for specific instructions unique to this visit that I personally wrote and verbalized to the the pt in detail and then reviewed with pt  by my nurse highlighting any  changes in therapy recommended at today's visit to their plan of care.

## 2018-02-18 ENCOUNTER — Ambulatory Visit (INDEPENDENT_AMBULATORY_CARE_PROVIDER_SITE_OTHER): Payer: Medicare Other | Admitting: Psychiatry

## 2018-02-18 ENCOUNTER — Other Ambulatory Visit: Payer: Self-pay

## 2018-02-18 ENCOUNTER — Encounter: Payer: Self-pay | Admitting: Psychiatry

## 2018-02-18 VITALS — BP 135/75 | HR 101 | Temp 98.4°F | Wt 204.8 lb

## 2018-02-18 DIAGNOSIS — F17200 Nicotine dependence, unspecified, uncomplicated: Secondary | ICD-10-CM | POA: Diagnosis not present

## 2018-02-18 DIAGNOSIS — F4310 Post-traumatic stress disorder, unspecified: Secondary | ICD-10-CM

## 2018-02-18 DIAGNOSIS — G4701 Insomnia due to medical condition: Secondary | ICD-10-CM

## 2018-02-18 DIAGNOSIS — F411 Generalized anxiety disorder: Secondary | ICD-10-CM | POA: Diagnosis not present

## 2018-02-18 MED ORDER — TRAZODONE HCL 50 MG PO TABS: 25.0000 mg | ORAL_TABLET | Freq: Every evening | ORAL | 1 refills | Status: DC | PRN

## 2018-02-18 MED ORDER — BUSPIRONE HCL 5 MG PO TABS: 5.0000 mg | ORAL_TABLET | Freq: Two times a day (BID) | ORAL | 0 refills | Status: DC

## 2018-02-18 NOTE — Patient Instructions (Signed)
Trazodone tablets What is this medicine? TRAZODONE (TRAZ oh done) is used to treat depression. This medicine may be used for other purposes; ask your health care provider or pharmacist if you have questions. COMMON BRAND NAME(S): Desyrel What should I tell my health care provider before I take this medicine? They need to know if you have any of these conditions: -attempted suicide or thinking about it -bipolar disorder -bleeding problems -glaucoma -heart disease, or previous heart attack -irregular heart beat -kidney or liver disease -low levels of sodium in the blood -an unusual or allergic reaction to trazodone, other medicines, foods, dyes or preservatives -pregnant or trying to get pregnant -breast-feeding How should I use this medicine? Take this medicine by mouth with a glass of water. Follow the directions on the prescription label. Take this medicine shortly after a meal or a light snack. Take your medicine at regular intervals. Do not take your medicine more often than directed. Do not stop taking this medicine suddenly except upon the advice of your doctor. Stopping this medicine too quickly may cause serious side effects or your condition may worsen. A special MedGuide will be given to you by the pharmacist with each prescription and refill. Be sure to read this information carefully each time. Talk to your pediatrician regarding the use of this medicine in children. Special care may be needed. Overdosage: If you think you have taken too much of this medicine contact a poison control center or emergency room at once. NOTE: This medicine is only for you. Do not share this medicine with others. What if I miss a dose? If you miss a dose, take it as soon as you can. If it is almost time for your next dose, take only that dose. Do not take double or extra doses. What may interact with this medicine? Do not take this medicine with any of the following medications: -certain medicines  for fungal infections like fluconazole, itraconazole, ketoconazole, posaconazole, voriconazole -cisapride -dofetilide -dronedarone -linezolid -MAOIs like Carbex, Eldepryl, Marplan, Nardil, and Parnate -mesoridazine -methylene blue (injected into a vein) -pimozide -saquinavir -thioridazine This medicine may also interact with the following medications: -alcohol -antiviral medicines for HIV or AIDS -aspirin and aspirin-like medicines -barbiturates like phenobarbital -certain medicines for blood pressure, heart disease, irregular heart beat -certain medicines for depression, anxiety, or psychotic disturbances -certain medicines for migraine headache like almotriptan, eletriptan, frovatriptan, naratriptan, rizatriptan, sumatriptan, zolmitriptan -certain medicines for seizures like carbamazepine and phenytoin -certain medicines for sleep -certain medicines that treat or prevent blood clots like dalteparin, enoxaparin, warfarin -digoxin -fentanyl -lithium -NSAIDS, medicines for pain and inflammation, like ibuprofen or naproxen -other medicines that prolong the QT interval (cause an abnormal heart rhythm) -rasagiline -supplements like St. John's wort, kava kava, valerian -tramadol -tryptophan This list may not describe all possible interactions. Give your health care provider a list of all the medicines, herbs, non-prescription drugs, or dietary supplements you use. Also tell them if you smoke, drink alcohol, or use illegal drugs. Some items may interact with your medicine. What should I watch for while using this medicine? Tell your doctor if your symptoms do not get better or if they get worse. Visit your doctor or health care professional for regular checks on your progress. Because it may take several weeks to see the full effects of this medicine, it is important to continue your treatment as prescribed by your doctor. Patients and their families should watch out for new or worsening  thoughts of suicide or depression. Also watch   out for sudden changes in feelings such as feeling anxious, agitated, panicky, irritable, hostile, aggressive, impulsive, severely restless, overly excited and hyperactive, or not being able to sleep. If this happens, especially at the beginning of treatment or after a change in dose, call your health care professional. You may get drowsy or dizzy. Do not drive, use machinery, or do anything that needs mental alertness until you know how this medicine affects you. Do not stand or sit up quickly, especially if you are an older patient. This reduces the risk of dizzy or fainting spells. Alcohol may interfere with the effect of this medicine. Avoid alcoholic drinks. This medicine may cause dry eyes and blurred vision. If you wear contact lenses you may feel some discomfort. Lubricating drops may help. See your eye doctor if the problem does not go away or is severe. Your mouth may get dry. Chewing sugarless gum, sucking hard candy and drinking plenty of water may help. Contact your doctor if the problem does not go away or is severe. What side effects may I notice from receiving this medicine? Side effects that you should report to your doctor or health care professional as soon as possible: -allergic reactions like skin rash, itching or hives, swelling of the face, lips, or tongue -elevated mood, decreased need for sleep, racing thoughts, impulsive behavior -confusion -fast, irregular heartbeat -feeling faint or lightheaded, falls -feeling agitated, angry, or irritable -loss of balance or coordination -painful or prolonged erections -restlessness, pacing, inability to keep still -suicidal thoughts or other mood changes -tremors -trouble sleeping -seizures -unusual bleeding or bruising Side effects that usually do not require medical attention (report to your doctor or health care professional if they continue or are bothersome): -change in sex drive or  performance -change in appetite or weight -constipation -headache -muscle aches or pains -nausea This list may not describe all possible side effects. Call your doctor for medical advice about side effects. You may report side effects to FDA at 1-800-FDA-1088. Where should I keep my medicine? Keep out of the reach of children. Store at room temperature between 15 and 30 degrees C (59 to 86 degrees F). Protect from light. Keep container tightly closed. Throw away any unused medicine after the expiration date. NOTE: This sheet is a summary. It may not cover all possible information. If you have questions about this medicine, talk to your doctor, pharmacist, or health care provider.  2019 Elsevier/Gold Standard (2017-03-11 17:51:24)  
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
## 2018-02-19 ENCOUNTER — Other Ambulatory Visit: Payer: Self-pay | Admitting: Cardiology

## 2018-02-19 ENCOUNTER — Other Ambulatory Visit: Payer: Self-pay | Admitting: Psychiatry

## 2018-02-20 LAB — LIPID PANEL W/O CHOL/HDL RATIO
CHOLESTEROL TOTAL: 138 mg/dL (ref 100–199)
HDL: 40 mg/dL (ref >39)
LDL Calculated: 60 mg/dL (ref 0–99)
TRIGLYCERIDES: 192 mg/dL — AB (ref 0–149)
VLDL Cholesterol Cal: 38 mg/dL (ref 5–40)

## 2018-02-20 LAB — COMPREHENSIVE METABOLIC PANEL
A/G RATIO: 1.8 (ref 1.2–2.2)
ALK PHOS: 131 IU/L — AB (ref 39–117)
ALT: 16 IU/L (ref 0–32)
AST: 17 IU/L (ref 0–40)
Albumin: 4.2 g/dL (ref 3.8–4.8)
BUN/Creatinine Ratio: 17 (ref 12–28)
BUN: 13 mg/dL (ref 8–27)
Bilirubin Total: <0.2 mg/dL (ref 0.0–1.2)
CO2: 23 mmol/L (ref 20–29)
Calcium: 9.2 mg/dL (ref 8.7–10.3)
Chloride: 98 mmol/L (ref 96–106)
Creatinine, Ser: 0.75 mg/dL (ref 0.57–1.00)
GFR calc Af Amer: 95 mL/min/{1.73_m2} (ref >59)
GFR calc non Af Amer: 82 mL/min/{1.73_m2} (ref >59)
GLOBULIN, TOTAL: 2.4 g/dL (ref 1.5–4.5)
Glucose: 110 mg/dL — ABNORMAL HIGH (ref 65–99)
POTASSIUM: 4.2 mmol/L (ref 3.5–5.2)
SODIUM: 138 mmol/L (ref 134–144)
Total Protein: 6.6 g/dL (ref 6.0–8.5)

## 2018-02-20 LAB — TSH: TSH: 1.34 u[IU]/mL (ref 0.450–4.500)

## 2018-02-23 ENCOUNTER — Ambulatory Visit (INDEPENDENT_AMBULATORY_CARE_PROVIDER_SITE_OTHER): Payer: Medicare Other | Admitting: Licensed Clinical Social Worker

## 2018-02-23 DIAGNOSIS — F411 Generalized anxiety disorder: Secondary | ICD-10-CM | POA: Diagnosis not present

## 2018-02-27 ENCOUNTER — Telehealth: Payer: Self-pay | Admitting: Cardiology

## 2018-02-27 NOTE — Telephone Encounter (Signed)
Advised her to reduce starch, and to add fish oil capsule after dinner.  Otherwise labs are stable.

## 2018-03-03 ENCOUNTER — Other Ambulatory Visit: Payer: Self-pay | Admitting: Internal Medicine

## 2018-03-05 ENCOUNTER — Ambulatory Visit: Payer: Medicare Other | Admitting: Psychiatry

## 2018-03-11 ENCOUNTER — Other Ambulatory Visit: Payer: Self-pay

## 2018-03-11 DIAGNOSIS — I10 Essential (primary) hypertension: Secondary | ICD-10-CM

## 2018-03-11 MED ORDER — LOSARTAN POTASSIUM-HCTZ 100-25 MG PO TABS: 1.0000 | ORAL_TABLET | Freq: Every day | ORAL | 3 refills | Status: DC

## 2018-03-14 ENCOUNTER — Other Ambulatory Visit: Payer: Self-pay | Admitting: Psychiatry

## 2018-03-14 DIAGNOSIS — G4701 Insomnia due to medical condition: Secondary | ICD-10-CM

## 2018-03-23 ENCOUNTER — Other Ambulatory Visit: Payer: Self-pay | Admitting: Cardiology

## 2018-03-23 DIAGNOSIS — R0989 Other specified symptoms and signs involving the circulatory and respiratory systems: Secondary | ICD-10-CM

## 2018-03-31 ENCOUNTER — Ambulatory Visit: Payer: Medicare Other | Admitting: Licensed Clinical Social Worker

## 2018-04-10 ENCOUNTER — Other Ambulatory Visit: Payer: Self-pay | Admitting: Oncology

## 2018-05-23 NOTE — Progress Notes (Signed)
   THERAPIST PROGRESS NOTE  Session Time: 1 hour  Participation Level: Active  Behavioral Response: CasualAlertAnxious  Type of Therapy: Individual Therapy  Treatment Goals addressed: Anxiety  Interventions: Solution Focused  Summary: [name] is a 68 y.o. female who presents with continued symptoms.  Therapist met with Patient in an initial therapy session to assess current mood and to build rapport. Therapist engaged Patient in discussion about her life and what is going well for her. Therapist provided support for Patient as she shared details about her life, her current stressors, mood, coping skills, her past, and her children. Therapist prompted Patient to discuss her support system and ways that she manages her daily stress, anger, and frustrations.  LCSW discussed what psychotherapy is and is not and the importance of the therapeutic relationship to include open and honest communication between client and therapist and building trust.  Reviewed advantages and disadvantages of the therapeutic process and limitations to the therapeutic relationship including LCSW's role in maintaining the safety of the client, others and those in client's care.    Suicidal/Homicidal: No  Plan: Return again in 2 weeks.  Diagnosis: Axis I: Generalized Anxiety Disorder    Axis II: No diagnosis    Lubertha South, LCSW 05/23/2018

## 2018-06-16 ENCOUNTER — Ambulatory Visit (INDEPENDENT_AMBULATORY_CARE_PROVIDER_SITE_OTHER): Payer: Self-pay

## 2018-06-16 ENCOUNTER — Other Ambulatory Visit: Payer: Self-pay

## 2018-06-16 DIAGNOSIS — R0989 Other specified symptoms and signs involving the circulatory and respiratory systems: Secondary | ICD-10-CM

## 2018-06-21 ENCOUNTER — Other Ambulatory Visit: Payer: Self-pay | Admitting: Cardiology

## 2018-06-21 DIAGNOSIS — I6522 Occlusion and stenosis of left carotid artery: Secondary | ICD-10-CM

## 2018-06-25 ENCOUNTER — Other Ambulatory Visit: Payer: Self-pay

## 2018-06-25 DIAGNOSIS — I10 Essential (primary) hypertension: Secondary | ICD-10-CM

## 2018-06-25 MED ORDER — LOSARTAN POTASSIUM-HCTZ 100-25 MG PO TABS: 1.0000 | ORAL_TABLET | Freq: Every day | ORAL | 1 refills | Status: DC

## 2018-07-30 ENCOUNTER — Inpatient Hospital Stay: Payer: Medicare Other | Attending: Oncology

## 2018-07-30 ENCOUNTER — Other Ambulatory Visit: Payer: Self-pay

## 2018-07-30 ENCOUNTER — Inpatient Hospital Stay (HOSPITAL_BASED_OUTPATIENT_CLINIC_OR_DEPARTMENT_OTHER): Payer: Medicare Other | Admitting: Oncology

## 2018-07-30 VITALS — BP 127/60 | HR 91 | Temp 98.7°F | Resp 18 | Ht 61.5 in | Wt 201.8 lb

## 2018-07-30 DIAGNOSIS — D500 Iron deficiency anemia secondary to blood loss (chronic): Secondary | ICD-10-CM | POA: Diagnosis present

## 2018-07-30 DIAGNOSIS — R1013 Epigastric pain: Secondary | ICD-10-CM

## 2018-07-30 DIAGNOSIS — D472 Monoclonal gammopathy: Secondary | ICD-10-CM

## 2018-07-30 DIAGNOSIS — K5900 Constipation, unspecified: Secondary | ICD-10-CM | POA: Insufficient documentation

## 2018-07-30 DIAGNOSIS — D509 Iron deficiency anemia, unspecified: Secondary | ICD-10-CM

## 2018-07-30 LAB — CBC WITH DIFFERENTIAL (CANCER CENTER ONLY)
Abs Immature Granulocytes: 0.01 10*3/uL (ref 0.00–0.07)
Basophils Absolute: 0 10*3/uL (ref 0.0–0.1)
Basophils Relative: 1 %
Eosinophils Absolute: 0.3 10*3/uL (ref 0.0–0.5)
Eosinophils Relative: 4 %
HCT: 42.1 % (ref 36.0–46.0)
Hemoglobin: 13.7 g/dL (ref 12.0–15.0)
Immature Granulocytes: 0 %
Lymphocytes Relative: 28 %
Lymphs Abs: 1.8 10*3/uL (ref 0.7–4.0)
MCH: 27.8 pg (ref 26.0–34.0)
MCHC: 32.5 g/dL (ref 30.0–36.0)
MCV: 85.4 fL (ref 80.0–100.0)
Monocytes Absolute: 0.6 10*3/uL (ref 0.1–1.0)
Monocytes Relative: 9 %
Neutro Abs: 3.7 10*3/uL (ref 1.7–7.7)
Neutrophils Relative %: 58 %
Platelet Count: 226 10*3/uL (ref 150–400)
RBC: 4.93 MIL/uL (ref 3.87–5.11)
RDW: 14.6 % (ref 11.5–15.5)
WBC Count: 6.3 10*3/uL (ref 4.0–10.5)
nRBC: 0 % (ref 0.0–0.2)

## 2018-07-30 LAB — FERRITIN: Ferritin: 94 ng/mL (ref 11–307)

## 2018-07-30 LAB — IRON AND TIBC
Iron: 44 ug/dL (ref 41–142)
Saturation Ratios: 11 % — ABNORMAL LOW (ref 21–57)
TIBC: 389 ug/dL (ref 236–444)
UIBC: 345 ug/dL (ref 120–384)

## 2018-07-30 NOTE — Progress Notes (Signed)
Hematology and Oncology Follow Up Visit  [name] [id number] [birth year] 68 y.o. 07/30/2018 1:20 PM Alvester Chou, NPBarr, Almyra Free, NP   Principle Diagnosis: 68 year old woman with:  1.  Iron deficiency anemia diagnosed in 2018.  This is related to chronic blood loss and poor iron absorption.    2.  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  Likely related to reactive findings without a plasma cell disorder or lymphoproliferative disorder at this time.  Current therapy: Oral iron replacement.  Interim History: [name] returns today for a follow-up.  Since her last visit, she reports no major changes in her health.  She continues to take oral iron supplements which has caused her some issues with dyspepsia and constipation.  He denies any hematochezia, melena or excessive fatigue.  Performance status remains reasonable at this time.   She denied any alteration mental status, neuropathy, confusion or dizziness.  Denies any headaches or lethargy.  Denies any night sweats, weight loss or changes in appetite.  Denied orthopnea, dyspnea on exertion or chest discomfort.  Denies shortness of breath, difficulty breathing hemoptysis or cough.  Denies any abdominal distention, nausea, early satiety or dyspepsia.  Denies any hematuria, frequency, dysuria or nocturia.  Denies any skin irritation, dryness or rash.  Denies any ecchymosis or petechiae.  Denies any lymphadenopathy or clotting.  Denies any heat or cold intolerance.  Denies any anxiety or depression.  Remaining review of system is negative.        Medications: Medication reviewed without any changes. Current Outpatient Medications  Medication Sig Dispense Refill  . albuterol (PROVENTIL HFA;VENTOLIN HFA) 108 (90 Base) MCG/ACT inhaler Inhale 2 puffs into the lungs every 4 (four) hours as needed for wheezing or shortness of breath (cough). 1 Inhaler 1  . aspirin EC 81 MG tablet Take 1  tablet (81 mg total) by mouth 2 (two) times daily. (Patient taking differently: Take 81 mg by mouth daily. ) 60 tablet 0  . atorvastatin (LIPITOR) 10 MG tablet Take 10 mg by mouth daily.    . bisoprolol (ZEBETA) 10 MG tablet Take 10 mg by mouth daily.  4  . busPIRone (BUSPAR) 5 MG tablet Take 1 tablet (5 mg total) by mouth 2 (two) times daily. 60 tablet 0  . Cholecalciferol (VITAMIN D-3) 5000 units TABS Take 5,000 Units by mouth daily.    . [facility] IRON 325 (65 Fe) MG tablet TAKE 1 TABLET BY MOUTH EVERY DAY WITH BREAKFAST 90 tablet 3  . docusate sodium (COLACE) 100 MG capsule Take 100-200 mg by mouth daily as needed (for constipation.).     Marland Kitchen DULoxetine (CYMBALTA) 60 MG capsule Take 60 mg by mouth daily after breakfast.     . losartan-hydrochlorothiazide (HYZAAR) 100-25 MG tablet Take 1 tablet by mouth daily. 90 tablet 1  . methocarbamol (ROBAXIN) 500 MG tablet Take 1 tablet (500 mg total) by mouth every 6 (six) hours as needed for muscle spasms. (Patient taking differently: Take 500 mg by mouth See admin instructions. Take one tablet (500 mg) by mouth daily at bedtime, may also take one tablet (500 mg) during the day as needed for muscle spasms) 60 tablet 0  . nitroGLYCERIN (NITROSTAT) 0.4 MG SL tablet Place 0.4 mg under the tongue every 5 (five) minutes x 3 doses as needed for chest pain.   1  . omeprazole (PRILOSEC) 40 MG capsule Take 40 mg by mouth daily.    Marland Kitchen OVER THE COUNTER  MEDICATION Apply 1 application topically as needed (joint pain). Magnilife topicall muscle rub    . oxyCODONE-acetaminophen (PERCOCET) 7.5-325 MG tablet Take 1 tablet by mouth every 4 (four) hours as needed for severe pain. 30 tablet 0  . pantoprazole (PROTONIX) 40 MG tablet TAKE 1 TABLET BY MOUTH 30- 60 MINUTES BEFORE YOUR FIRST AND LAST MEALS OF THE DAY 180 tablet 0  . rOPINIRole (REQUIP) 1 MG tablet Take 1 mg by mouth 3 (three) times daily.     . Tolnaftate (ABSORBINE JR EX) Apply 1 application topically 4 (four) times daily  as needed (knee pain).     . traZODone (DESYREL) 50 MG tablet TAKE 0.5-1 TABLETS (25-50 MG TOTAL) BY MOUTH AT BEDTIME AS NEEDED FOR SLEEP. 90 tablet 1   No current facility-administered medications for this visit.      Allergies:  Allergies  Allergen Reactions  . Bee Venom Anaphylaxis  . Gabapentin Other (See Comments)    "Bad dreams"  . Morphine And Related Other (See Comments)    Overly sedated  . Codeine Hives  . Adhesive [Tape] Other (See Comments)    Redness, Paper tape is ok  . Sulfa Antibiotics Other (See Comments)    Unknown childhood reaction  . Sulfonamide Derivatives Other (See Comments)    Unknown childhood reaction     Past Medical History, Surgical history, Social history, and Family History unchanged by my review.  Review of Systems:    Physical Exam:  Blood pressure 127/60, pulse 91, temperature 98.7 F (37.1 C), temperature source Oral, resp. rate 18, height 5' 1.5" (1.562 m), weight 201 lb 12.8 oz (91.5 kg), SpO2 98 %.   ECOG: 1     General appearance: Comfortable appearing without any discomfort Head: Normocephalic without any trauma Oropharynx: Mucous membranes are moist and pink without any thrush or ulcers. Eyes: Pupils are equal and round reactive to light. Lymph nodes: No cervical, supraclavicular, inguinal or axillary lymphadenopathy.   Heart:regular rate and rhythm.  S1 and S2 without leg edema. Lung: Clear without any rhonchi or wheezes.  No dullness to percussion. Abdomin: Soft, nontender, nondistended with good bowel sounds.  No hepatosplenomegaly. Musculoskeletal: No joint deformity or effusion.  Full range of motion noted. Neurological: No deficits noted on motor, sensory and deep tendon reflex exam. Skin: No petechial rash or dryness.  Appeared moist.        Lab Results: Lab Results  Component Value Date   WBC 4.9 11/27/2017   HGB 10.2 (L) 11/27/2017   HCT 32.5 (L) 11/27/2017   MCV 90.5 11/27/2017   PLT 177 11/27/2017      Chemistry      Component Value Date/Time   NA 138 02/19/2018 1006   K 4.2 02/19/2018 1006   K 3.5 03/19/2011 0722   CL 98 02/19/2018 1006   CO2 23 02/19/2018 1006   BUN 13 02/19/2018 1006   CREATININE 0.75 02/19/2018 1006      Component Value Date/Time   CALCIUM 9.2 02/19/2018 1006   ALKPHOS 131 (H) 02/19/2018 1006   AST 17 02/19/2018 1006   ALT 16 02/19/2018 1006   BILITOT <0.2 02/19/2018 1006        Impression and Plan:  68 year old woman with:     1.  Iron deficiency anemia diagnosed in 2018.  He has been on oral iron replacement since that time.  Her anemia is related to blood loss from surgery as well as potentially from oral iron absorption issues.  The natural course  of this disease and treatment options were reviewed today.  Continuing oral iron versus intravenous iron was discussed.  Complication associated with Feraheme infusion were reviewed.  These would include paralysis, myalgias and rarely anaphylaxis.  Her hemoglobin today is back to normal and does not require any further intervention.  I have recommended discontinuation of oral iron therapy at this time given normalization of her counts.  2.    Monoclonal gammopathy detected in May 2019.  She was noted to have IgM on immunofixation with normal IgM level and no M spike.  The differential diagnosis was reviewed today to include reactive findings versus a lymphoproliferative disorder.  Malignant disorder is considered less likely at this time and no intervention is needed.  No further follow-up is needed.  3.  Follow-up: I have recommended follow-up as needed here.  I also recommend checking iron studies periodically and replace with oral iron as needed.  15  minutes was spent with the patient face-to-face today.  More than 50% of time was spent on updating a disease status, laboratory data review, treatment options and complications related to therapy.    Zola Button, MD 7/16/20201:20 PM

## 2018-08-03 LAB — MULTIPLE MYELOMA PANEL, SERUM
Albumin SerPl Elph-Mcnc: 3.8 g/dL (ref 2.9–4.4)
Albumin/Glob SerPl: 1.3 (ref 0.7–1.7)
Alpha 1: 0.2 g/dL (ref 0.0–0.4)
Alpha2 Glob SerPl Elph-Mcnc: 1 g/dL (ref 0.4–1.0)
B-Globulin SerPl Elph-Mcnc: 1.1 g/dL (ref 0.7–1.3)
Gamma Glob SerPl Elph-Mcnc: 0.7 g/dL (ref 0.4–1.8)
Globulin, Total: 3 g/dL (ref 2.2–3.9)
IgA: 211 mg/dL (ref 87–352)
IgG (Immunoglobin G), Serum: 832 mg/dL (ref 586–1602)
IgM (Immunoglobulin M), Srm: 79 mg/dL (ref 26–217)
Total Protein ELP: 6.8 g/dL (ref 6.0–8.5)

## 2018-08-19 ENCOUNTER — Other Ambulatory Visit: Payer: Self-pay | Admitting: *Deleted

## 2018-08-19 DIAGNOSIS — Z006 Encounter for examination for normal comparison and control in clinical research program: Secondary | ICD-10-CM

## 2018-08-19 DIAGNOSIS — I739 Peripheral vascular disease, unspecified: Secondary | ICD-10-CM

## 2018-08-25 ENCOUNTER — Ambulatory Visit (HOSPITAL_COMMUNITY)
Admission: RE | Admit: 2018-08-25 | Discharge: 2018-08-25 | Disposition: A | Discharge status: Home / Self Care | Payer: Medicare Other | Source: Ambulatory Visit | Attending: Surgery | Admitting: Surgery

## 2018-08-25 ENCOUNTER — Ambulatory Visit (HOSPITAL_BASED_OUTPATIENT_CLINIC_OR_DEPARTMENT_OTHER)
Admission: RE | Admit: 2018-08-25 | Discharge: 2018-08-25 | Disposition: A | Discharge status: Home / Self Care | Payer: Medicare Other | Source: Ambulatory Visit | Attending: Surgery | Admitting: Surgery

## 2018-08-25 ENCOUNTER — Encounter (HOSPITAL_COMMUNITY): Payer: Medicare Other

## 2018-08-25 ENCOUNTER — Other Ambulatory Visit: Payer: Self-pay

## 2018-08-25 ENCOUNTER — Other Ambulatory Visit: Payer: Self-pay | Admitting: *Deleted

## 2018-08-25 ENCOUNTER — Encounter: Payer: Medicare Other | Admitting: *Deleted

## 2018-08-25 VITALS — BP 164/69 | HR 94 | Wt 201.0 lb

## 2018-08-25 DIAGNOSIS — Z006 Encounter for examination for normal comparison and control in clinical research program: Secondary | ICD-10-CM

## 2018-08-25 DIAGNOSIS — I739 Peripheral vascular disease, unspecified: Secondary | ICD-10-CM

## 2018-08-25 NOTE — Research (Signed)
Patient doing well, recovering from knee surgery. Trying to stay well and away from virus.   Physical exam: Right leg assessed DPP +1 and PTP +1     Left leg not assessed   [place] Class: 1 mild claudication PARC classification: mild claudication/limb symptoms  6 MWT: Started at 1045 ended at 1051 Walked 365 meters No SOB before or after and no fatigue before or after   WIQ Walking Impairment Questionnaire (WIQ) 1a. Date WIQ completed: 2018-08-25  1b. Time: (HH:MM)  1035  Num Question Very Much Some Slight None  A. Walking Impairment  2a. Pain, aching, or cramps in calves (or buttocks)? ? ? ? ? ?  2b. If 2a ? None, please indicate which Leg: Both  3a. Pain or aching in thighs? ? ? ? ? ?  3b. If 3a ? None, please indicate which Leg Choose an item.  4.  Pain, stiffness, or aching in joints (ankles, knees, or hips) ? ? ? ? ?  5. Weakness in one or both legs ? ? ? ? ?  6. Pain or discomfort in your chest ? ? ? ? ?  7. Shortness of breath ? ? ? ? ?  8.  Heart Palpitations ? ? ? ? ?           9.              Other problems   Num Question Unable Much Some Slight None  B. Walking Distance  10. Walking indoors (i.e. around house) ? ? ? ? ?  11. Walking 50 feet ? ? ? ? ?  12. Walking 150 feet (1/2 block) ? ? ? ? ?          Num Question Unable Much Some Slight None  13. Walking 300 feet (1 block) ? ? ? ? ?  14. Walking 600 feet (2 blocks) ? ? ? ? ?  15.  Walking 900 feet (3 blocks) ? ? ? ? ?  16.  Walking 1500 feet (5 blocks) ? ? ? ? ?  Walking Speed:  17. Walking 1 block slowly ? ? ? ? ?  18.  Walking 1 block at an average speed? ? ? ? ? ?  19. Walking 1 block quickly? ? ? ? ? ?  20. Running or jogging 1 block ? ? ? ? ?  Stair Climbing:  21. Climbing 1 flight of stairs ? ? ? ? ?  22. Climbing 2 flights of stairs ? ? ? ? ?  23. Climbing 3 flights of stairs. ? ? ? ? ?   Coordinator Signature: Philemon Kingdom :)    Date: 08/25/2018  PI Signature: see EPIC note for co-sign  and dated    [place] The following questions refer to blockages in the arteries of your body, particularly your legs, and how that might affect your life. Please read and complete the following questions. There are no right or wrong answers. Please mark the answer that best applies to you.  1. Blockages in the arteries, often referred to as peripheral vascular disease, affect different people in different ways. Some feel cramping or aching while others feel fatigue. Which leg (or buttock) causes you the most severe discomfort, fatigue, pain, aching, or cramps? The RIGHT leg (buttock) the LEFT leg (buttocks) Both are the same Neither  ?    ?    ?       ?  2. Please review the list  below and indicate how much limitation you have due to your peripheral vascular disease (discomfort, fatigue, pain, aching, or cramps in your calves (or buttocks)) over the past 4 weeks.   Place an X in one box on each line  Activity Extremely Limited Quite a bit Limited Moderately Limited Slightly Limited Not at all  Limited Limited for other reasons or did not do the activity.  Walking around your home ? ? ? ? ? ?  Walking 1-2 blocks on level ground ? ? ? ? ? ?  Walking 1-2 blocks up a hill ? ? ? ? ? ?  Walking 3-4 blocks on level ground ? ? ? ? ? ?  Hurrying or jogging (as if to catch a bus) ? ? ? ? ? ?  Vigorous work or exercise ? ? ? ? ? ?   3. Compared with 4 weeks ago, have your symptoms of peripheral vascular disease (discomfort, fatigue, pain, aching, or cramps in your calves (or buttocks) changed? My symptoms have become.  Much Worse Slightly worse Not changed Slightly better Much Better I have had no symptoms over the past 4 weeks  ? ? ? ? ? ?         4. Over the past 4 weeks, how many times did you have discomfort, fatigue, pain, aching, or cramps in your calves (or buttocks)? All of the time Several times per day At least once a day 3 or more times per weeks but  not every day 1-2 times per week Less than once a week Never over the past 4 weeks  ? ? ? ? ? ? ?     5. Over the past 4 weeks, how many times did you have discomfort, fatigue, pain, aching, or cramps in your calves (or buttocks) bothered you?  It has been. Extremely bothersome Moderately bothersome Somewhat bothersome Slightly bothersome Not at all bothersome I've had no leg discomfort  ? ? ? ? ? ?   6. Over the past 4 weeks, how often have you been awakened with pain, aching, or cramps in your legs or feet? Every night 3 or more times per week but not every night 1-2 times per week Less than once a week  Never over the past 4 weeks   ? ? ? ? ?   7. How satisfied are you that everything possible is being done to treat your peripheral vascular disease? Not satisfied at all  Mostly dissatisfied Somewhat satisfied Mostly satisfied Completely satisfied  ? ?  ?  ?  ?        8. How satisfied are you with the explanations your doctor has given you about your peripheral vascular disease? Not satisfied at all  Mostly dissatisfied Somewhat satisfied Mostly satisfied Completely satisfied  ? ?  ?  ?  ?    9. Overall, how satisfied are you with the current treatment of your peripheral vascular disease? Not satisfied at all  Mostly dissatisfied Somewhat satisfied Mostly satisfied Completely satisfied  ? ?  ?  ?  ?    10. Over the past 4 weeks, how much has your peripheral vascular disease limited your enjoyment of life? It has extremely limited my enjoyment of life It has limited my enjoyment quite a bit  It has moderately limited my enjoyment of life It has slightly limited my enjoyment of life It has not limited my enjoyment of life at all   ? ? ? ? ?   57.  If you had to spend the rest of your life with your peripheral vascular disease the way it is right now, how would you feel about this? Not satisfied at all  Mostly dissatisfied Somewhat satisfied Mostly satisfied Completely satisfied  ? ?   ?  ?  ?    12. Over the past 4 weeks, how often have you felt discouraged or down in the dumps because of your peripheral vascular disease? I felt that way all of the time I felt that way most of the time I occasionally felt that way  I rarely felt that way I never felt that way  ? ? ? ? ?   13. How much does your peripheral vascular disease affect your lifestyle? Please indicate how your discomfort, fatigue, pain, aching, or cramps in your calves (or buttocks) may have limited your participation in the following activities over the past 4 weeks.   Please place an X in one box on each line Activity Severely limited Limited quite a bit Moderately limited Slightly limited Did not limit at all Does not apply or did not do for other reasons  Hobbies, recreational activities ? ? ? ? ? ?  Visiting family or friends out of your home ? ? ? ? ? ?  Working or doing household chores ? ? ? ? ? ?   Coordinator Signature: Philemon Kingdom :)    Date: 08/25/2018 @ 1030 PI Signature: see EPIC note for co-sign and dated    Current Outpatient Medications:  .  albuterol (PROVENTIL HFA;VENTOLIN HFA) 108 (90 Base) MCG/ACT inhaler, Inhale 2 puffs into the lungs every 4 (four) hours as needed for wheezing or shortness of breath (cough)., Disp: 1 Inhaler, Rfl: 1 .  aspirin EC 81 MG tablet, Take 1 tablet (81 mg total) by mouth 2 (two) times daily. (Patient taking differently: Take 81 mg by mouth daily. ), Disp: 60 tablet, Rfl: 0 .  atorvastatin (LIPITOR) 10 MG tablet, Take 10 mg by mouth daily., Disp: , Rfl:  .  bisoprolol (ZEBETA) 10 MG tablet, Take 10 mg by mouth daily., Disp: , Rfl: 4 .  busPIRone (BUSPAR) 5 MG tablet, Take 1 tablet (5 mg total) by mouth 2 (two) times daily., Disp: 60 tablet, Rfl: 0 .  Cholecalciferol (VITAMIN D-3) 5000 units TABS, Take 5,000 Units by mouth daily., Disp: , Rfl:  .  docusate sodium (COLACE) 100 MG capsule, Take 100-200 mg by mouth daily as needed (for constipation.). , Disp: ,  Rfl:  .  DULoxetine (CYMBALTA) 60 MG capsule, Take 60 mg by mouth daily after breakfast. , Disp: , Rfl:  .  losartan-hydrochlorothiazide (HYZAAR) 100-25 MG tablet, Take 1 tablet by mouth daily., Disp: 90 tablet, Rfl: 1 .  methocarbamol (ROBAXIN) 500 MG tablet, Take 1 tablet (500 mg total) by mouth every 6 (six) hours as needed for muscle spasms. (Patient taking differently: Take 500 mg by mouth See admin instructions. Take one tablet (500 mg) by mouth daily at bedtime, may also take one tablet (500 mg) during the day as needed for muscle spasms), Disp: 60 tablet, Rfl: 0 .  nitroGLYCERIN (NITROSTAT) 0.4 MG SL tablet, Place 0.4 mg under the tongue every 5 (five) minutes x 3 doses as needed for chest pain. , Disp: , Rfl: 1 .  omeprazole (PRILOSEC) 40 MG capsule, Take 40 mg by mouth daily., Disp: , Rfl:  .  OVER THE COUNTER MEDICATION, Apply 1 application topically as needed (joint pain). Magnilife topicall muscle rub, Disp: ,  Rfl:  .  oxyCODONE-acetaminophen (PERCOCET) 7.5-325 MG tablet, Take 1 tablet by mouth every 4 (four) hours as needed for severe pain., Disp: 30 tablet, Rfl: 0 .  rOPINIRole (REQUIP) 1 MG tablet, Take 1 mg by mouth 3 (three) times daily. , Disp: , Rfl:  .  Tolnaftate (ABSORBINE JR EX), Apply 1 application topically 4 (four) times daily as needed (knee pain). , Disp: , Rfl:  .  traZODone (DESYREL) 50 MG tablet, TAKE 0.5-1 TABLETS (25-50 MG TOTAL) BY MOUTH AT BEDTIME AS NEEDED FOR SLEEP., Disp: 90 tablet, Rfl: 1 .  [facility] IRON 325 (65 Fe) MG tablet, TAKE 1 TABLET BY MOUTH EVERY DAY WITH BREAKFAST (Patient not taking: Reported on 08/25/2018), Disp: 90 tablet, Rfl: 3

## 2018-11-30 DIAGNOSIS — M79644 Pain in right finger(s): Secondary | ICD-10-CM | POA: Insufficient documentation

## 2018-11-30 DIAGNOSIS — M79631 Pain in right forearm: Secondary | ICD-10-CM | POA: Insufficient documentation

## 2018-12-23 ENCOUNTER — Other Ambulatory Visit: Payer: Self-pay

## 2018-12-23 ENCOUNTER — Ambulatory Visit (INDEPENDENT_AMBULATORY_CARE_PROVIDER_SITE_OTHER): Payer: Self-pay

## 2018-12-23 DIAGNOSIS — I6523 Occlusion and stenosis of bilateral carotid arteries: Secondary | ICD-10-CM

## 2018-12-23 DIAGNOSIS — I6522 Occlusion and stenosis of left carotid artery: Secondary | ICD-10-CM

## 2018-12-27 ENCOUNTER — Other Ambulatory Visit: Payer: Self-pay | Admitting: Cardiology

## 2018-12-27 DIAGNOSIS — I6523 Occlusion and stenosis of bilateral carotid arteries: Secondary | ICD-10-CM

## 2018-12-30 NOTE — Progress Notes (Signed)
Lvm for pt to call back. 

## 2019-01-06 ENCOUNTER — Other Ambulatory Visit: Payer: Medicare Other

## 2019-01-13 ENCOUNTER — Ambulatory Visit (INDEPENDENT_AMBULATORY_CARE_PROVIDER_SITE_OTHER): Payer: Self-pay | Admitting: Cardiology

## 2019-01-13 ENCOUNTER — Other Ambulatory Visit: Payer: Self-pay

## 2019-01-13 ENCOUNTER — Encounter: Payer: Self-pay | Admitting: Cardiology

## 2019-01-13 VITALS — BP 168/84 | HR 81 | Temp 97.4°F | Resp 14 | Ht 61.0 in | Wt 202.2 lb

## 2019-01-13 DIAGNOSIS — E782 Mixed hyperlipidemia: Secondary | ICD-10-CM

## 2019-01-13 DIAGNOSIS — F17200 Nicotine dependence, unspecified, uncomplicated: Secondary | ICD-10-CM

## 2019-01-13 DIAGNOSIS — I6523 Occlusion and stenosis of bilateral carotid arteries: Secondary | ICD-10-CM

## 2019-01-13 DIAGNOSIS — G609 Hereditary and idiopathic neuropathy, unspecified: Secondary | ICD-10-CM

## 2019-01-13 DIAGNOSIS — M48062 Spinal stenosis, lumbar region with neurogenic claudication: Secondary | ICD-10-CM

## 2019-01-13 DIAGNOSIS — I10 Essential (primary) hypertension: Secondary | ICD-10-CM

## 2019-01-13 DIAGNOSIS — Z006 Encounter for examination for normal comparison and control in clinical research program: Secondary | ICD-10-CM

## 2019-01-13 MED ORDER — AMLODIPINE BESYLATE 5 MG PO TABS: 5.0000 mg | ORAL_TABLET | Freq: Every day | ORAL | 2 refills | Status: DC

## 2019-01-13 MED ORDER — LOSARTAN POTASSIUM-HCTZ 50-12.5 MG PO TABS: 1.0000 | ORAL_TABLET | ORAL | 1 refills | Status: DC

## 2019-01-13 NOTE — Progress Notes (Signed)
Primary Physician/Referring:  Alvester Chou, NP  Patient ID: [name], female    DOB: 1950/05/30, 68 y.o.   MRN: [id number]  Chief Complaint  Patient presents with  . Hypertension  . Claudication    1 year for PAD   HPI:    [name]  is a 68 y.o. Caucasian female with history of hypertension, hypertriglyceridemia, tobacco use, and prediabetes. PAD, underwent successful revascularization of high-grade stenosis of right SFA on 08/06/2016. No significant disease in the left leg,  venous insufficiency and venous claudication. She was encouraged to use for stockings for venous insufficiency, and unless has worsening symptoms to hold off on venous ablation. Symptoms have improved and leg edema has essentially resolved. She has incidental 95% left renal artery stenosis.  Her main complaint today is severe pain in both her feet and ankles.  States that it makes her life very miserable.  She also has tingling and numbness.  She is also noticed severe back pain that shoots down her right leg especially all the way down to her toes.  Unfortunately still smoking about 1 to 2 packs of cigarettes a day.  She has not had any chest pain but does have chronic dyspnea.  Past Medical History:  Diagnosis Date  . Anxiety   . Aortic atherosclerosis ([place])   . Bilateral cataracts   . Carpal tunnel syndrome   . Cervical cancer ([place])   . Chronic back pain   . Claudication ([place])   . COPD (chronic obstructive pulmonary disease) ([facility])    wheezing  . DDD (degenerative disc disease), cervical   . DDD (degenerative disc disease), lumbar   . Depression   . Diverticulosis   . Gastroparesis   . GERD (gastroesophageal reflux disease)   . Hearing loss    Bilateral  . History of blood transfusion   . History of colon polyps   . History of migraine   . Hypertension   . Internal hemorrhoids   . Iron deficiency anemia   . Left-sided weakness   . Leg swelling   . Liver disease    pt unaware   . Lung nodule    several small  . OA (osteoarthritis)    both hands  . Positive ANA (antinuclear antibody)   . Pre-diabetes   . Raynaud's phenomenon   . Restless leg   . Seizures ([place])    no meds for 8 months  . Sleep apnea    does not use her cpap  . Spondylosis   . Stroke [facility]) 2015   no weakness  . 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   rt  . Urge incontinence of urine   . Varicose vein of leg   . Venous insufficiency    Past Surgical History:  Procedure Laterality Date  . BACK SURGERY    . BLEPHAROPLASTY  10/10/2017  . CATARACT EXTRACTION, BILATERAL    . CESAREAN SECTION    . CHOLECYSTECTOMY    . COLONOSCOPY  10/2016  . HAND SURGERY Bilateral    carpal tunnel  . HEMORRHOID SURGERY    . INTERSTIM IMPLANT PLACEMENT    . KNEE SURGERY Left    arthroscopy x3  . LOWER EXTREMITY ANGIOGRAPHY N/A 07/16/2016   Procedure: Lower Extremity Angiography;  Surgeon: Adrian Prows, MD;  Location: [place] [facility];  Service: Cardiovascular;  Laterality: N/A;  . LOWER EXTREMITY INTERVENTION N/A 08/06/2016   Procedure: Lower Extremity Intervention;  Surgeon: Adrian Prows, MD;  Location: [place] [facility];  Service: Cardiovascular;  Laterality: N/A;  . NECK SURGERY    . OTHER SURGICAL HISTORY  2013   bladder stimulator in back  . PERIPHERAL VASCULAR BALLOON ANGIOPLASTY  08/06/2016   Procedure: Peripheral Vascular Balloon Angioplasty;  Surgeon: Adrian Prows, MD;  Location: [place] [facility];  Service: Cardiovascular;;  Right SFA  . TENNIS ELBOW RELEASE/NIRSCHEL PROCEDURE Right 12/2015  . TOE SURGERY Right    right foot second and fourth toe  . TOTAL HIP ARTHROPLASTY Left   . TOTAL HIP REVISION Left 10/13/2017   Procedure: LEFT TOTAL HIP REVISION ACETABULUM, OPEN REDUCTION INTERNAL WITH BONE GRAFT FIXATION LEFT GREATER TROCHANTERIC FRACTURE;  Surgeon: Frederik Pear, MD;  Location: [facility];  Service: Orthopedics;  Laterality: Left;  . TOTAL KNEE ARTHROPLASTY Right 01/12/2016   Procedure: RIGHT TOTAL  KNEE ARTHROPLASTY;  Surgeon: Netta Cedars, MD;  Location: [facility];  Service: Orthopedics;  Laterality: Right;  . TUBAL LIGATION    . UPPER GI ENDOSCOPY  10/2016  . VAGINAL HYSTERECTOMY     Social History   Socioeconomic History  . Marital status: Married    Spouse name: sammy  . Number of children: 1  . Years of education: Not on file  . Highest education level: Associate degree: occupational, Hotel manager, or vocational program  Occupational History  . Not on file  Tobacco Use  . Smoking status: Current Every Day Smoker    Packs/day: 1.00    Years: 55.00    Pack years: 55.00    Types: Cigarettes  . Smokeless tobacco: Never Used  Substance and Sexual Activity  . Alcohol use: No    Alcohol/week: 0.0 standard drinks  . Drug use: Not Currently  . Sexual activity: Not Currently  Other Topics Concern  . Not on file  Social History Narrative  . Not on file   Social Determinants of Health   Financial Resource Strain: High Risk  . Difficulty of Paying Living Expenses: Hard  Food Insecurity: Food Insecurity Present  . Worried About Charity fundraiser in the Last Year: Often true  . Ran Out of Food in the Last Year: Often true  Transportation Needs: No Transportation Needs  . Lack of Transportation (Medical): No  . Lack of Transportation (Non-Medical): No  Physical Activity: Sufficiently Active  . Days of Exercise per Week: 5 days  . Minutes of Exercise per Session: 40 min  Stress: Stress Concern Present  . Feeling of Stress : Very much  Social Connections: Unknown  . Frequency of Communication with Friends and Family: Not on file  . Frequency of Social Gatherings with Friends and Family: Not on file  . Attends Religious Services: More than 4 times per year  . Active Member of Clubs or Organizations: No  . Attends Archivist Meetings: Never  . Marital Status: Married  Human resources officer Violence: Not At Risk  . Fear of Current or Ex-Partner: No  . Emotionally Abused:  No  . Physically Abused: No  . Sexually Abused: No   ROS  Review of Systems  Constitution: Positive for weight gain. Negative for chills, decreased appetite and malaise/fatigue.  Cardiovascular: Positive for claudication (neurogenic), dyspnea on exertion and leg swelling. Negative for syncope.  Endocrine: Negative for cold intolerance.  Hematologic/Lymphatic: Does not bruise/bleed easily.  Musculoskeletal: Positive for arthritis, back pain, joint pain and neck pain. Negative for joint swelling.  Gastrointestinal: Negative for abdominal pain, anorexia, change in bowel habit, hematochezia and melena.  Neurological: Positive for dizziness (occasional and chronic).  Negative for headaches and light-headedness.  Psychiatric/Behavioral: Negative for depression and substance abuse.  All other systems reviewed and are negative.  Objective  Blood pressure (!) 168/84, pulse 81, temperature (!) 97.4 F (36.3 C), temperature source Temporal, resp. rate 14, height _0  (1.549 m), weight 202 lb 3.2 oz (91.7 kg), SpO2 98 %.  Vitals with BMI 01/13/2019 01/13/2019 08/25/2018  Height - _1  -  Weight - 202 lbs 3 oz 201 lbs  BMI - 74.25 -  Systolic 956 387 564  Diastolic 84 84 69  Pulse 81 92 94  Some encounter information is confidential and restricted. Go to Review Flowsheets activity to see all data.     Physical Exam  Constitutional:  She has short stitch her and moderately obese in no acute distress.  HENT:  Head: Atraumatic.  Eyes: Conjunctivae are normal.  Neck: No JVD present. No thyromegaly present.  Cardiovascular: Normal rate, regular rhythm, normal heart sounds and intact distal pulses. Exam reveals no gallop.  No murmur heard. Pulses:      Carotid pulses are on the right side with bruit and on the left side with bruit.      Femoral pulses are 2+ on the right side and 2+ on the left side.      Popliteal pulses are 2+ on the right side and 2+ on the left side.       Dorsalis pedis  pulses are 1+ on the right side and 2+ on the left side.       Posterior tibial pulses are 2+ on the right side and 2+ on the left side.  No leg edema, no JVD. Superficial varicosities noted.  Except right DP 1 plus, normal vascular exam with bilateral carotid bruit.  Pulmonary/Chest: Effort normal and breath sounds normal.  Abdominal: Soft. Bowel sounds are normal.  Musculoskeletal:        General: Normal range of motion.     Cervical back: Neck supple.  Neurological: She is alert.  Skin: Skin is warm and dry.  Psychiatric: She has a normal mood and affect.   Laboratory examination:   Recent Labs    02/19/18 1006  NA 138  K 4.2  CL 98  CO2 23  GLUCOSE 110*  BUN 13  CREATININE 0.75  CALCIUM 9.2  GFRNONAA 82  GFRAA 95   CrCl cannot be calculated (Patient's most recent lab result is older than the maximum 21 days allowed.).  CMP Latest Ref Rng & Units 02/19/2018 10/31/2017 10/14/2017  Glucose 65 - 99 mg/dL 110(H) 99 158(H)  BUN 8 - 27 mg/dL _2 Creatinine 0.57 - 1.00 mg/dL 0.75 0.89 0.76  Sodium 134 - 144 mmol/L 138 134(L) 138  Potassium 3.5 - 5.2 mmol/L 4.2 3.9 4.1  Chloride 96 - 106 mmol/L 98 103 104  CO2 20 - 29 mmol/L _3 Calcium 8.7 - 10.3 mg/dL 9.2 8.8(L) 8.6(L)  Total Protein 6.0 - 8.5 g/dL 6.6 6.3(L) -  Total Bilirubin 0.0 - 1.2 mg/dL <0.2 0.1(L) -  Alkaline Phos 39 - 117 IU/L 131(H) 137(H) -  AST 0 - 40 IU/L 17 22 -  ALT 0 - 32 IU/L 16 20 -   CBC Latest Ref Rng & Units 07/30/2018 11/27/2017 10/31/2017  WBC 4.0 - 10.5 K/uL 6.3 4.9 5.5  Hemoglobin 12.0 - 15.0 g/dL 13.7 10.2(L) 9.5(L)  Hematocrit 36.0 - 46.0 % 42.1 32.5(L) 32.3(L)  Platelets 150 - 400 K/uL 226 177 215  Lipid Panel     Component Value Date/Time   CHOL 138 02/19/2018 1006   TRIG 192 (H) 02/19/2018 1006   HDL 40 02/19/2018 1006   CHOLHDL 4.2 08/24/2012 2317   VLDL 28 08/24/2012 2317   LDLCALC 60 02/19/2018 1006   HEMOGLOBIN A1C Lab Results  Component Value Date   HGBA1C 6.1  (H) 01/02/2016   MPG 128 01/02/2016   TSH Recent Labs    02/19/18 1007  TSH 1.340   Medications and allergies   Allergies  Allergen Reactions  . Bee Venom Anaphylaxis  . Gabapentin Other (See Comments)    "Bad dreams"  . Morphine And Related Other (See Comments)    Overly sedated  . Codeine Hives  . Adhesive [Tape] Other (See Comments)    Redness, Paper tape is ok  . Sulfa Antibiotics Other (See Comments)    Unknown childhood reaction  . Sulfonamide Derivatives Other (See Comments)    Unknown childhood reaction     Current Outpatient Medications  Medication Instructions  . albuterol (PROVENTIL HFA;VENTOLIN HFA) 108 (90 Base) MCG/ACT inhaler 2 puffs, Inhalation, Every 4 hours PRN  . amLODipine (NORVASC) 5 mg, Oral, Daily  . aspirin EC 81 mg, Oral, 2 times daily  . busPIRone (BUSPAR) 5 mg, Oral, 2 times daily  . [facility] IRON 325 (65 Fe) MG tablet TAKE 1 TABLET BY MOUTH EVERY DAY WITH BREAKFAST  . docusate sodium (COLACE) 100-200 mg, Oral, Daily PRN  . DULoxetine (CYMBALTA) 60 mg, Oral, Daily after breakfast  . losartan-hydrochlorothiazide (HYZAAR) 50-12.5 MG tablet 1 tablet, Oral, BH-each morning  . methocarbamol (ROBAXIN) 500 mg, Oral, Every 6 hours PRN  . nitroGLYCERIN (NITROSTAT) 0.4 mg, Sublingual, Every 5 min x3 PRN  . omeprazole (PRILOSEC) 40 mg, Oral, Daily  . OVER THE COUNTER MEDICATION 1 application, Topical, As needed, Magnilife topicall muscle rub  . oxyCODONE-acetaminophen (PERCOCET) 7.5-325 MG tablet 1 tablet, Oral, Every 4 hours PRN  . rOPINIRole (REQUIP) 1 mg, Oral, 3 times daily  . traZODone (DESYREL) 25-50 mg, Oral, At bedtime PRN    Radiology:  No results found.  Cardiac Studies:   Lower extremity venous insufficiency study 07/15/2017: Venous insufficiency of the right great saphenous vein associated with communicating varicosities.  Small varicosities communicate in the proximal right short saphenous vein.  Mild varicosity of the left proximal calf that  communicate with left GSB.  She does not demonstrate arterial insufficiency. Conservative therapy: Dr. Kathlene Cote (IR)   Peripheral arteriogram 07/16/2016:  Right SFA 95% stenosis distal. Otherwise 3 vessel R/O bilateral lower extremity.  Left renal artery 95% stenosis.   08/06/2016: Patient randomized in [place] DCB Study: Successful PTA and drug coated balloon angioplasty with a 5.0 x 60 mm  Inpact Admiral balloon of the right mid SFA, stenosis reduced from 99% to 0%. Mild disease left.  Nuclear stress test  12/29/2017: 1. Lexiscan stress test was performed. Exercise capacity was not assessed. Stress symptoms included headache. Peak effect blood pressure was 172/68 mmHg. The resting and stress electrocardiogram demonstrated normal sinus rhythm, normal resting conduction, no resting arrhythmias and normal rest repolarization. Stress EKG is non diagnostic for ischemia as it is a pharmacologic stress. 2. The overall quality of the study is good. There is no evidence of abnormal lung activity. Stress SPECT images demonstrate homogeneous tracer distribution throughout the myocardium. Decreased tracer uptake in basal inferoseptal myocardium seen only on SPECT rest images likely represents tissue attenuation artifact. Gated SPECT imaging reveals normal myocardial thickening and wall motion. The  left ventricular ejection fraction was normal (68%). 3. Low risk study.  Carotid artery duplex  12/23/2018: Stenosis in the right internal carotid artery (16-49%). Stenosis in the right external carotid artery (<50%). Stenosis in the left internal carotid artery (1-15%). Stenosis in the left external carotid artery (<50%). Antegrade right vertebral artery flow. Antegrade left vertebral artery flow. No significant change from 06/16/2018. Follow up in one year is appropriate if clinically indicated.  Assessment     ICD-10-CM   1. Asymptomatic bilateral carotid artery stenosis  I65.23 EKG 12-Lead  2. Mixed  hyperlipidemia  E78.2 Lipid Panel With LDL/HDL Ratio  3. Tobacco use disorder  F17.200   4. Primary hypertension  I10 CMP14+EGFR    CBC    TSH    amLODipine (NORVASC) 5 MG tablet  5. Enrolled in clinical trial of device  Z00.6    08/06/2016: Patient randomized in [place] in PAD  6. Neurogenic claudication due to lumbar spinal stenosis  M48.062 Ambulatory referral to Neurology  7. Idiopathic peripheral neuropathy  G60.9 Ambulatory referral to Neurology    EKG 01/13/2019: Normal sinus rhythm at the rate of 85 bpm, left atrial enlargement, normal axis.  Poor R-wave progression, cannot exclude anteroseptal infarct old.  No evidence of ischemia.  Low-voltage complexes.  Pulmonary disease pattern. No significant change from  EKG 11/17/2017   Recommendations:   Meds ordered this encounter  Medications  . amLODipine (NORVASC) 5 MG tablet    Sig: Take 1 tablet (5 mg total) by mouth daily.    Dispense:  30 tablet    Refill:  2  . losartan-hydrochlorothiazide (HYZAAR) 50-12.5 MG tablet    Sig: Take 1 tablet by mouth every morning.    Dispense:  30 tablet    Refill:  1    [name]  is a 68 y.o. Caucasian female with history of hypertension, hypertriglyceridemia, tobacco use, and prediabetes. PAD, underwent successful revascularization of high-grade stenosis of right SFA on 08/06/2016. No significant disease in the left leg,  venous insufficiency and venous claudication. She was encouraged to use for stockings for venous insufficiency, and unless has worsening symptoms to hold off on venous ablation. Symptoms have improved and leg edema has essentially resolved. She has incidental 95% left renal artery stenosis.  Patient is here on annual visit and follow-up of peripheral arterial disease and also venous claudication along with hypertension and hyperlipidemia.  She has discontinued all the medications with regard to hypertension in view of low blood pressure however was the past few  months she has noted Elevated blood pressure also doing research visits her blood pressure was elevated.  I have added amlodipine 5 mg daily along with losartan HCT 50/12.5 g in the morning.  Will check labs in 2 weeks.  She has known left renal artery stenosis of high-grade, asymptomatic previously.  If blood pressure control becomes an issue, then we can consider revascularization.  With regard to peripheral arterial disease she has excellent pulses in the lower extremity, her symptoms are clearly indicated to of peripheral neuropathy with stockings distribution.  She does have chronic venous insufficiency as well but today there is no edema and do not think that venous claudication is contributing to her symptoms.  Smoking cessation was again discussed with the patient.  I'll make a referral to be evaluate her by neurology.  She also has lumbar sciatica and this may be contributing as well.  I will see her back in 4 weeks.  She needs  repeat labs as she has not been able to see her PCP in view of COVID-19. "Total time spent with patient was  40 minutes and greater than 50% of that time was spent in face to face discussion, counseling and coordination care"   Adrian Prows, MD, [facility] 01/13/2019, 6:21 PM [facility] [place] Office: [phone]

## 2019-01-13 NOTE — Patient Instructions (Signed)
Please have blood work done at any [facility]. In 10 days to 2 weeks.  Start her medications today if possible.

## 2019-01-28 ENCOUNTER — Ambulatory Visit: Payer: PRIVATE HEALTH INSURANCE | Attending: Internal Medicine

## 2019-01-28 DIAGNOSIS — Z20822 Contact with and (suspected) exposure to covid-19: Secondary | ICD-10-CM

## 2019-01-29 LAB — NOVEL CORONAVIRUS, NAA: SARS-CoV-2, NAA: NOT DETECTED

## 2019-01-30 ENCOUNTER — Telehealth: Payer: Self-pay | Admitting: *Deleted

## 2019-01-30 NOTE — Telephone Encounter (Signed)
Patient given negative covid results . 

## 2019-02-04 LAB — CBC
Hematocrit: 41.6 % (ref 34.0–46.6)
Hemoglobin: 13.5 g/dL (ref 11.1–15.9)
MCH: 26.4 pg — ABNORMAL LOW (ref 26.6–33.0)
MCHC: 32.5 g/dL (ref 31.5–35.7)
MCV: 81 fL (ref 79–97)
Platelets: 309 10*3/uL (ref 150–450)
RBC: 5.12 x10E6/uL (ref 3.77–5.28)
RDW: 14.7 % (ref 11.7–15.4)
WBC: 5.8 10*3/uL (ref 3.4–10.8)

## 2019-02-04 LAB — CMP14+EGFR
ALT: 22 IU/L (ref 0–32)
AST: 20 IU/L (ref 0–40)
Albumin/Globulin Ratio: 1.7 (ref 1.2–2.2)
Albumin: 4.3 g/dL (ref 3.8–4.8)
Alkaline Phosphatase: 145 IU/L — ABNORMAL HIGH (ref 39–117)
BUN/Creatinine Ratio: 19 (ref 12–28)
BUN: 14 mg/dL (ref 8–27)
Bilirubin Total: 0.2 mg/dL (ref 0.0–1.2)
CO2: 26 mmol/L (ref 20–29)
Calcium: 9.4 mg/dL (ref 8.7–10.3)
Chloride: 99 mmol/L (ref 96–106)
Creatinine, Ser: 0.74 mg/dL (ref 0.57–1.00)
GFR calc Af Amer: 96 mL/min/{1.73_m2} (ref >59)
GFR calc non Af Amer: 83 mL/min/{1.73_m2} (ref >59)
Globulin, Total: 2.6 g/dL (ref 1.5–4.5)
Glucose: 115 mg/dL — ABNORMAL HIGH (ref 65–99)
Potassium: 4.4 mmol/L (ref 3.5–5.2)
Sodium: 138 mmol/L (ref 134–144)
Total Protein: 6.9 g/dL (ref 6.0–8.5)

## 2019-02-04 LAB — LIPID PANEL WITH LDL/HDL RATIO
Cholesterol, Total: 169 mg/dL (ref 100–199)
HDL: 40 mg/dL (ref >39)
LDL Chol Calc (NIH): 100 mg/dL — ABNORMAL HIGH (ref 0–99)
LDL/HDL Ratio: 2.5 ratio (ref 0.0–3.2)
Triglycerides: 163 mg/dL — ABNORMAL HIGH (ref 0–149)
VLDL Cholesterol Cal: 29 mg/dL (ref 5–40)

## 2019-02-04 LAB — TSH: TSH: 2.43 u[IU]/mL (ref 0.450–4.500)

## 2019-02-06 ENCOUNTER — Other Ambulatory Visit: Payer: Self-pay | Admitting: Cardiology

## 2019-02-11 ENCOUNTER — Ambulatory Visit: Payer: Medicare Other | Admitting: Cardiology

## 2019-02-12 ENCOUNTER — Encounter: Payer: Self-pay | Admitting: Neurology

## 2019-02-12 ENCOUNTER — Ambulatory Visit: Payer: Medicare Other | Admitting: Neurology

## 2019-02-12 ENCOUNTER — Other Ambulatory Visit: Payer: Self-pay

## 2019-02-12 VITALS — BP 144/72 | HR 78 | Temp 97.7°F | Ht 61.0 in | Wt 200.0 lb

## 2019-02-12 DIAGNOSIS — M5441 Lumbago with sciatica, right side: Secondary | ICD-10-CM

## 2019-02-12 DIAGNOSIS — M5410 Radiculopathy, site unspecified: Secondary | ICD-10-CM

## 2019-02-12 DIAGNOSIS — G629 Polyneuropathy, unspecified: Secondary | ICD-10-CM

## 2019-02-12 DIAGNOSIS — M5442 Lumbago with sciatica, left side: Secondary | ICD-10-CM | POA: Diagnosis not present

## 2019-02-12 DIAGNOSIS — G8929 Other chronic pain: Secondary | ICD-10-CM

## 2019-02-12 NOTE — Progress Notes (Signed)
[facility] Clinic Note - Initial Visit   Date: 02/12/19  [name] MRN: [id number] DOB: [birth year]   Dear Dr. Einar Gip:  Thank you for your kind referral of [name] [facility] for consultation of bilateral leg pain. Although her history is well known to you, please allow [place] to reiterate it for the purpose of our medical record. The patient was accompanied to the clinic by self.   History of Present Illness: [name] is a 69 y.o. female with hypertension, hyperlipidemia, COPD, tobacco use, PAD s/p right SFA angioplasty (2018), chronic low back pain s/p lumbar surgery presenting for evaluation of bilateral leg pain.  She has been evaluated here by Dr. Delice Lesch for spells and neck pain.   Today, she presents with ongoing complains of bilateral leg pain and discomfort.  Symptoms have been occurring for the past 3-4 years.  Pain is described as sharp and shoots down her legs, with no specific triggers.  It can be present at rest or with activity.  She also has numbness in the lower legs, worse in the right leg.  She is followed by Dr. Hardin Negus in Pain Management and takes percocet 1 tablet every 4 hours for pain, which provides some relief.  She has know PAD and had normal ABI in August 2020.   Out-side paper records, electronic medical record, and images have been reviewed where available and summarized as:  MRI lumbar spine 09/06/2004: 1.  Status post left hemilaminectomy at L4-5 and L5-S1 with good decompression of the thecal sac and no evidence of post procedural complication. 2.  Mild right foraminal narrowing at the L5-S1 level secondary to some facet arthropathy and minimal disk bulging.  ABI 08/25/2018:  Normal bilaterally  Lab Results  Component Value Date   HGBA1C 6.1 (H) 01/02/2016   No results found for: [facility] Lab Results  Component Value Date   TSH 2.430 02/03/2019   Lab Results  Component Value Date   ESRSEDRATE 38 (H) 01/09/2017      Past Medical History:  Diagnosis Date  . Anxiety   . Aortic atherosclerosis ([place])   . Bilateral cataracts   . Carpal tunnel syndrome   . Cervical cancer ([place])   . Chronic back pain   . Claudication ([place])   . COPD (chronic obstructive pulmonary disease) ([facility])    wheezing  . DDD (degenerative disc disease), cervical   . DDD (degenerative disc disease), lumbar   . Depression   . Diverticulosis   . Gastroparesis   . GERD (gastroesophageal reflux disease)   . Hearing loss    Bilateral  . History of blood transfusion   . History of colon polyps   . History of migraine   . Hypertension   . Internal hemorrhoids   . Iron deficiency anemia   . Left-sided weakness   . Leg swelling   . Liver disease    pt unaware  . Lung nodule    several small  . OA (osteoarthritis)    both hands  . Positive ANA (antinuclear antibody)   . Pre-diabetes   . Raynaud's phenomenon   . Restless leg   . Seizures ([place])    no meds for 8 months  . Sleep apnea    does not use her cpap  . Spondylosis   . Stroke [facility]) 2015   no weakness  . 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   rt  . Urge incontinence of urine   . Varicose vein of leg   .  Venous insufficiency     Past Surgical History:  Procedure Laterality Date  . BACK SURGERY    . BLEPHAROPLASTY  10/10/2017  . CATARACT EXTRACTION, BILATERAL    . CESAREAN SECTION    . CHOLECYSTECTOMY    . COLONOSCOPY  10/2016  . HAND SURGERY Bilateral    carpal tunnel  . HEMORRHOID SURGERY    . INTERSTIM IMPLANT PLACEMENT    . KNEE SURGERY Left    arthroscopy x3  . LOWER EXTREMITY ANGIOGRAPHY N/A 07/16/2016   Procedure: Lower Extremity Angiography;  Surgeon: Adrian Prows, MD;  Location: [place] [facility];  Service: Cardiovascular;  Laterality: N/A;  . LOWER EXTREMITY INTERVENTION N/A 08/06/2016   Procedure: Lower Extremity Intervention;  Surgeon: Adrian Prows, MD;  Location: [place] [facility];  Service: Cardiovascular;  Laterality: N/A;  . NECK SURGERY    .  OTHER SURGICAL HISTORY  2013   bladder stimulator in back  . PERIPHERAL VASCULAR BALLOON ANGIOPLASTY  08/06/2016   Procedure: Peripheral Vascular Balloon Angioplasty;  Surgeon: Adrian Prows, MD;  Location: [place] [facility];  Service: Cardiovascular;;  Right SFA  . TENNIS ELBOW RELEASE/NIRSCHEL PROCEDURE Right 12/2015  . TOE SURGERY Right    right foot second and fourth toe  . TOTAL HIP ARTHROPLASTY Left   . TOTAL HIP REVISION Left 10/13/2017   Procedure: LEFT TOTAL HIP REVISION ACETABULUM, OPEN REDUCTION INTERNAL WITH BONE GRAFT FIXATION LEFT GREATER TROCHANTERIC FRACTURE;  Surgeon: Frederik Pear, MD;  Location: [facility];  Service: Orthopedics;  Laterality: Left;  . TOTAL KNEE ARTHROPLASTY Right 01/12/2016   Procedure: RIGHT TOTAL KNEE ARTHROPLASTY;  Surgeon: Netta Cedars, MD;  Location: [place];  Service: Orthopedics;  Laterality: Right;  . TUBAL LIGATION    . UPPER GI ENDOSCOPY  10/2016  . VAGINAL HYSTERECTOMY       Medications:  Outpatient Encounter Medications as of 02/12/2019  Medication Sig Note  . albuterol (PROVENTIL HFA;VENTOLIN HFA) 108 (90 Base) MCG/ACT inhaler Inhale 2 puffs into the lungs every 4 (four) hours as needed for wheezing or shortness of breath (cough).   Marland Kitchen aspirin EC 81 MG tablet Take 1 tablet (81 mg total) by mouth 2 (two) times daily. (Patient taking differently: Take 81 mg by mouth daily. )   . atorvastatin (LIPITOR) 20 MG tablet Take 20 mg by mouth daily.   . busPIRone (BUSPAR) 5 MG tablet Take 1 tablet (5 mg total) by mouth 2 (two) times daily.   . Diclofenac Sodium (DICLO GEL TD) Place onto the skin.   . DULoxetine (CYMBALTA) 60 MG capsule Take 60 mg by mouth daily after breakfast.  10/31/2017: #30 [facility]  . gemfibrozil (LOPID) 600 MG tablet Take 600 mg by mouth 2 (two) times daily before a meal.   . losartan-hydrochlorothiazide (HYZAAR) 50-12.5 MG tablet TAKE 1 TABLET BY MOUTH EVERY DAY IN THE MORNING   . methocarbamol (ROBAXIN) 500 MG tablet Take 1  tablet (500 mg total) by mouth every 6 (six) hours as needed for muscle spasms. (Patient taking differently: Take 500 mg by mouth See admin instructions. Take one tablet (500 mg) by mouth daily at bedtime, may also take one tablet (500 mg) during the day as needed for muscle spasms) 10/31/2017: #60 [facility]  . nitroGLYCERIN (NITROSTAT) 0.4 MG SL tablet Place 0.4 mg under the tongue every 5 (five) minutes x 3 doses as needed for chest pain.  10/31/2017: #25 [facility]  . omeprazole (PRILOSEC) 40 MG capsule Take 40 mg  by mouth daily.   Marland Kitchen OVER THE COUNTER MEDICATION Apply 1 application topically as needed (joint pain). Magnilife topicall muscle rub   . oxyCODONE-acetaminophen (PERCOCET) 7.5-325 MG tablet Take 1 tablet by mouth every 4 (four) hours as needed for severe pain. 10/31/2017: #180/30 days [facility]21/17 [facility]  . rOPINIRole (REQUIP) 1 MG tablet Take 1 mg by mouth 3 (three) times daily.  10/31/2017: #270/90 days filled [facility]18/17 [facility]  . traZODone (DESYREL) 50 MG tablet TAKE 0.5-1 TABLETS (25-50 MG TOTAL) BY MOUTH AT BEDTIME AS NEEDED FOR SLEEP.   . [DISCONTINUED] amLODipine (NORVASC) 5 MG tablet Take 1 tablet (5 mg total) by mouth daily.   . [DISCONTINUED] [facility] IRON 325 (65 Fe) MG tablet TAKE 1 TABLET BY MOUTH EVERY DAY WITH BREAKFAST   . [DISCONTINUED] docusate sodium (COLACE) 100 MG capsule Take 100-200 mg by mouth daily as needed (for constipation.).     No facility-administered encounter medications on file as of 02/12/2019.    Allergies:  Allergies  Allergen Reactions  . Bee Venom Anaphylaxis  . Gabapentin Other (See Comments)    "Bad dreams"  . Morphine And Related Other (See Comments)    Overly sedated  . Codeine Hives  . Adhesive [Tape] Other (See Comments)    Redness, Paper tape is ok  . Sulfa Antibiotics Other (See Comments)    Unknown childhood reaction  . Sulfonamide Derivatives Other (See Comments)    Unknown childhood reaction     Family History: Family  History  Problem Relation Age of Onset  . Hyperlipidemia Mother   . Hypertension Mother   . Anxiety disorder Mother   . Depression Mother   . Hyperlipidemia Father   . Hypertension Father   . Alzheimer's disease Father   . Anxiety disorder Brother   . Cancer Daughter        unknown  . Anxiety disorder Brother   . Anxiety disorder Brother   . Drug abuse Brother   . Stroke Brother   . Colon cancer Neg Hx   . Esophageal cancer Neg Hx   . Rectal cancer Neg Hx   . Stomach cancer Neg Hx     Social History: Social History   Tobacco Use  . Smoking status: Current Every Day Smoker    Packs/day: 1.00    Years: 55.00    Pack years: 55.00    Types: Cigarettes  . Smokeless tobacco: Never Used  Substance Use Topics  . Alcohol use: No    Alcohol/week: 0.0 standard drinks  . Drug use: Not Currently   Social History   Social History Narrative  . Not on file    Vital Signs:  BP (!) 144/72   Pulse 78   Temp 97.7 F (36.5 C)   Ht 5\' 1"  (1.549 m)   Wt 200 lb (90.7 kg)   SpO2 98%   BMI 37.79 kg/m   Neurological Exam: MENTAL STATUS including orientation to time, place, person, recent and remote memory, attention span and concentration, language, and fund of knowledge is normal.  Speech is not dysarthric.  CRANIAL NERVES: II:  No visual field defects.   III-IV-VI: Pupils equal round and reactive to light.  Normal conjugate, extra-ocular eye movements in all directions of gaze.  No nystagmus.  No ptosis.   V:  Normal facial sensation.    VII:  Normal facial symmetry and movements.   VIII:  Normal hearing and vestibular function.   IX-X:  Normal palatal movement.   XI:  Normal  shoulder shrug and head rotation.   XII:  Normal tongue strength and range of motion, no deviation or fasciculation.  MOTOR:  Intermittent give-way weakness throughout, with repeated testing and encouragement all muscle groups are 5/5 throughout. No atrophy, fasciculations or abnormal movements.  No  pronator drift.   Upper Extremity:  Right  Left  Deltoid  5/5   5/5   Biceps  5/5   5/5   Triceps  5/5   5/5   Infraspinatus 5/5  5/5  Medial pectoralis 5/5  5/5  Wrist extensors  5/5   5/5   Wrist flexors  5/5   5/5   Finger extensors  5/5   5/5   Finger flexors  5/5   5/5   Dorsal interossei  5/5   5/5   Abductor pollicis  5/5   5/5   Tone (Ashworth scale)  0  0   Lower Extremity:  Right  Left  Hip flexors  5/5   5/5   Hip extensors  5/5   5/5   Adductor 5/5  5/5  Abductor 5/5  5/5  Knee flexors  5/5   5/5   Knee extensors  5/5   5/5   Dorsiflexors  5/5   5/5   Plantarflexors  5/5   5/5   Toe extensors  5/5   5/5   Toe flexors  5/5   5/5   Tone (Ashworth scale)  0  0   MSRs:  Right        Left                  brachioradialis 2+  2+  biceps 2+  2+  triceps 2+  2+  patellar 1+  1+  ankle jerk 1+  1+  Hoffman no  no  plantar response down  down   SENSORY:  Reduced sensation to pin prick, vibration, and temperature in the right lower extremity, intact in the left including at the foot.  COORDINATION/GAIT: Normal finger-to- nose-finger.  Intact rapid alternating movements bilaterally.  Gait appears mildly wide-based, stable, unassisted.  She can stand on heels and toes. Unable to perform tandem gait.    IMPRESSION: 1. Chronic back pain with radicular leg pain with history of prior lumbar surgery  - I will check CT lumbar spine for nerve impingement. Unable to get MRI due to bladder stimulator  - Physical therapy declined by patient  - Continue to follow-up with pain management   2. 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  EDX would be helpful in distinguishing this from radiculopathy, but patient did not want to proceed with testing.    Further recommendations pending results.  Total time spent: 45 min   Thank you for allowing me to participate in patient's care.  If I can answer any additional questions, I  would be pleased to do so.    Sincerely,    Diamonte Stavely K. Posey Pronto, DO

## 2019-02-12 NOTE — Patient Instructions (Addendum)
CT lumbar spine.  We will call you with the results

## 2019-02-23 ENCOUNTER — Other Ambulatory Visit: Payer: Self-pay

## 2019-02-23 ENCOUNTER — Encounter: Payer: Self-pay | Admitting: Cardiology

## 2019-02-23 ENCOUNTER — Ambulatory Visit (INDEPENDENT_AMBULATORY_CARE_PROVIDER_SITE_OTHER): Payer: Medicare Other | Admitting: Cardiology

## 2019-02-23 VITALS — BP 124/76 | HR 98 | Temp 97.7°F | Resp 16 | Ht 61.0 in | Wt 205.0 lb

## 2019-02-23 DIAGNOSIS — I739 Peripheral vascular disease, unspecified: Secondary | ICD-10-CM

## 2019-02-23 DIAGNOSIS — G609 Hereditary and idiopathic neuropathy, unspecified: Secondary | ICD-10-CM | POA: Diagnosis not present

## 2019-02-23 DIAGNOSIS — I10 Essential (primary) hypertension: Secondary | ICD-10-CM | POA: Diagnosis not present

## 2019-02-23 DIAGNOSIS — E782 Mixed hyperlipidemia: Secondary | ICD-10-CM | POA: Diagnosis not present

## 2019-02-23 MED ORDER — LOSARTAN POTASSIUM-HCTZ 50-12.5 MG PO TABS: ORAL_TABLET | ORAL | 3 refills | Status: DC

## 2019-02-23 NOTE — Progress Notes (Signed)
Primary Physician/Referring:  Alvester Chou, NP  Patient ID: [name], female    DOB: [birth year], 69 y.o.   MRN: [id number]  Chief Complaint  Patient presents with  . Hypertension  . Hyperlipidemia  . Follow-up    6 week   HPI:    AMIYRAH LAMERE  is a 69 y.o. Caucasian female with history of hypertension, hypertriglyceridemia, tobacco use quit in August 2020, prediabetes mellitus, PAD with history of right SFA angioplasty 08/06/2016 and no significant disease in the left leg, pseudoclaudication from spinal stenosis and also history of venous insufficiency being treated conservatively with resolution of leg edema with use of support stockings, presents here for follow-up of hyperlipidemia and hypertension.  Incidental left renal artery stenosis being managed medically.  [place] f/u of leg pain, hypertension and hyperlipidemia.  She has not had any chest pain but does have chronic dyspnea. Continue to have severe bilateral leg pain bot at rest and with activity.   Past Medical History:  Diagnosis Date  . Anxiety   . Aortic atherosclerosis ([place])   . Bilateral cataracts   . Carpal tunnel syndrome   . Cervical cancer ([place])   . Chronic back pain   . Claudication ([place])   . COPD (chronic obstructive pulmonary disease) (HCC)    wheezing  . DDD (degenerative disc disease), cervical   . DDD (degenerative disc disease), lumbar   . Depression   . Diverticulosis   . Gastroparesis   . GERD (gastroesophageal reflux disease)   . Hearing loss    Bilateral  . History of blood transfusion   . History of colon polyps   . History of migraine   . Hypertension   . Internal hemorrhoids   . Iron deficiency anemia   . Left-sided weakness   . Leg swelling   . Liver disease    pt unaware  . Lung nodule    several small  . OA (osteoarthritis)    both hands  . Positive ANA (antinuclear antibody)   . Pre-diabetes   . Raynaud's phenomenon   . Restless leg   . Seizures ([place])    no  meds for 8 months  . Sleep apnea    does not use her cpap  . Spondylosis   . Stroke [facility]) 2015   no weakness  . 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   rt  . Urge incontinence of urine   . Varicose vein of leg   . Venous insufficiency    Past Surgical History:  Procedure Laterality Date  . BACK SURGERY    . BLEPHAROPLASTY  10/10/2017  . CATARACT EXTRACTION, BILATERAL    . CESAREAN SECTION    . CHOLECYSTECTOMY    . COLONOSCOPY  10/2016  . HAND SURGERY Bilateral    carpal tunnel  . HEMORRHOID SURGERY    . INTERSTIM IMPLANT PLACEMENT    . KNEE SURGERY Left    arthroscopy x3  . LOWER EXTREMITY ANGIOGRAPHY N/A 07/16/2016   Procedure: Lower Extremity Angiography;  Surgeon: Adrian Prows, MD;  Location: [facility];  Service: Cardiovascular;  Laterality: N/A;  . LOWER EXTREMITY INTERVENTION N/A 08/06/2016   Procedure: Lower Extremity Intervention;  Surgeon: Adrian Prows, MD;  Location: [place] [facility];  Service: Cardiovascular;  Laterality: N/A;  . NECK SURGERY    . OTHER SURGICAL HISTORY  2013   bladder stimulator in back  . PERIPHERAL VASCULAR BALLOON ANGIOPLASTY  08/06/2016   Procedure: Peripheral Vascular Balloon Angioplasty;  Surgeon: Adrian Prows,  MD;  Location: [facility];  Service: Cardiovascular;;  Right SFA  . TENNIS ELBOW RELEASE/NIRSCHEL PROCEDURE Right 12/2015  . TOE SURGERY Right    right foot second and fourth toe  . TOTAL HIP ARTHROPLASTY Left   . TOTAL HIP REVISION Left 10/13/2017   Procedure: LEFT TOTAL HIP REVISION ACETABULUM, OPEN REDUCTION INTERNAL WITH BONE GRAFT FIXATION LEFT GREATER TROCHANTERIC FRACTURE;  Surgeon: Frederik Pear, MD;  Location: [facility];  Service: Orthopedics;  Laterality: Left;  . TOTAL KNEE ARTHROPLASTY Right 01/12/2016   Procedure: RIGHT TOTAL KNEE ARTHROPLASTY;  Surgeon: Netta Cedars, MD;  Location: [facility];  Service: Orthopedics;  Laterality: Right;  . TUBAL LIGATION    . UPPER GI ENDOSCOPY  10/2016  . VAGINAL HYSTERECTOMY     Social  History   Tobacco Use  . Smoking status: Current Every Day Smoker    Packs/day: 1.00    Years: 55.00    Pack years: 55.00    Types: Cigarettes  . Smokeless tobacco: Never Used  Substance Use Topics  . Alcohol use: No    Alcohol/week: 0.0 standard drinks    ROS  Review of Systems  Constitution: Positive for weight gain. Negative for chills, decreased appetite and malaise/fatigue.  Cardiovascular: Positive for claudication (neurogenic), dyspnea on exertion and leg swelling. Negative for syncope.  Endocrine: Negative for cold intolerance.  Hematologic/Lymphatic: Does not bruise/bleed easily.  Musculoskeletal: Positive for arthritis, back pain, joint pain and neck pain. Negative for joint swelling.  Gastrointestinal: Negative for abdominal pain, anorexia, change in bowel habit, hematochezia and melena.  Neurological: Positive for dizziness (occasional and chronic). Negative for headaches and light-headedness.  Psychiatric/Behavioral: Negative for depression and substance abuse.  All other systems reviewed and are negative.  Objective  Blood pressure 124/76, pulse 98, temperature 97.7 F (36.5 C), temperature source Temporal, resp. rate 16, height 5\' 1"  (1.549 m), weight 205 lb (93 kg), SpO2 97 %.  Vitals with BMI 02/23/2019 02/12/2019 01/13/2019  Height 5\' 1"  5\' 1"  -  Weight 205 lbs 200 lbs -  BMI 16.10 96.04 -  Systolic 540 981 191  Diastolic 76 72 84  Pulse 98 78 81  Some encounter information is confidential and restricted. Go to Review Flowsheets activity to see all data.     Physical Exam  Constitutional:  She has short stitch her and moderately obese in no acute distress.  HENT:  Head: Atraumatic.  Eyes: Conjunctivae are normal.  Neck: No JVD present. No thyromegaly present.  Cardiovascular: Normal rate, regular rhythm, normal heart sounds and intact distal pulses. Exam reveals no gallop.  No murmur heard. Pulses:      Carotid pulses are on the right side with bruit and  on the left side with bruit.      Femoral pulses are 2+ on the right side and 2+ on the left side.      Popliteal pulses are 2+ on the right side and 2+ on the left side.       Dorsalis pedis pulses are 1+ on the right side and 2+ on the left side.       Posterior tibial pulses are 2+ on the right side and 2+ on the left side.  No leg edema, no JVD. Superficial varicosities noted.  Except right DP 1 plus, normal vascular exam with bilateral carotid bruit.  Pulmonary/Chest: Effort normal and breath sounds normal.  Abdominal: Soft. Bowel sounds are normal.  Musculoskeletal:        General: Normal range  of motion.     Cervical back: Neck supple.  Neurological: She is alert.  Skin: Skin is warm and dry.  Psychiatric: She has a normal mood and affect.   Laboratory examination:   Recent Labs    02/03/19 0904  NA 138  K 4.4  CL 99  CO2 26  GLUCOSE 115*  BUN 14  CREATININE 0.74  CALCIUM 9.4  GFRNONAA 83  GFRAA 96   estimated creatinine clearance is 69 mL/min (by C-G formula based on SCr of 0.74 mg/dL).  CMP Latest Ref Rng & Units 02/03/2019 02/19/2018 10/31/2017  Glucose 65 - 99 mg/dL 115(H) 110(H) 99  BUN 8 - 27 mg/dL 14 13 12   Creatinine 0.57 - 1.00 mg/dL 0.74 0.75 0.89  Sodium 134 - 144 mmol/L 138 138 134(L)  Potassium 3.5 - 5.2 mmol/L 4.4 4.2 3.9  Chloride 96 - 106 mmol/L 99 98 103  CO2 20 - 29 mmol/L 26 23 23   Calcium 8.7 - 10.3 mg/dL 9.4 9.2 8.8(L)  Total Protein 6.0 - 8.5 g/dL 6.9 6.6 6.3(L)  Total Bilirubin 0.0 - 1.2 mg/dL 0.2 <0.2 0.1(L)  Alkaline Phos 39 - 117 IU/L 145(H) 131(H) 137(H)  AST 0 - 40 IU/L 20 17 22   ALT 0 - 32 IU/L 22 16 20    CBC Latest Ref Rng & Units 02/03/2019 07/30/2018 11/27/2017  WBC 3.4 - 10.8 x10E3/uL 5.8 6.3 4.9  Hemoglobin 11.1 - 15.9 g/dL 13.5 13.7 10.2(L)  Hematocrit 34.0 - 46.6 % 41.6 42.1 32.5(L)  Platelets 150 - 450 x10E3/uL 309 226 177   Lipid Panel     Component Value Date/Time   CHOL 169 02/03/2019 0904   TRIG 163 (H) 02/03/2019  0904   HDL 40 02/03/2019 0904   CHOLHDL 4.2 08/24/2012 2317   VLDL 28 08/24/2012 2317   LDLCALC 100 (H) 02/03/2019 0904   HEMOGLOBIN A1C Lab Results  Component Value Date   HGBA1C 6.1 (H) 01/02/2016   MPG 128 01/02/2016   TSH Recent Labs    02/03/19 0904  TSH 2.430   Medications and allergies   Allergies  Allergen Reactions  . Bee Venom Anaphylaxis  . Gabapentin Other (See Comments)    "Bad dreams"  . Morphine And Related Other (See Comments)    Overly sedated  . Codeine Hives  . Adhesive [Tape] Other (See Comments)    Redness, Paper tape is ok  . Sulfa Antibiotics Other (See Comments)    Unknown childhood reaction  . Sulfonamide Derivatives Other (See Comments)    Unknown childhood reaction     Current Outpatient Medications  Medication Instructions  . albuterol (PROVENTIL HFA;VENTOLIN HFA) 108 (90 Base) MCG/ACT inhaler 2 puffs, Inhalation, Every 4 hours PRN  . aspirin EC 81 mg, Oral, 2 times daily  . atorvastatin (LIPITOR) 20 mg, Oral, Daily  . busPIRone (BUSPAR) 5 mg, Oral, 2 times daily  . Diclofenac Sodium (DICLO GEL TD) Transdermal  . DULoxetine (CYMBALTA) 60 mg, Oral, Daily after breakfast  . gemfibrozil (LOPID) 600 mg, Oral, 2 times daily before meals  . losartan-hydrochlorothiazide (HYZAAR) 50-12.5 MG tablet TAKE 1 TABLET BY MOUTH EVERY DAY IN THE MORNING  . methocarbamol (ROBAXIN) 500 mg, Oral, Every 6 hours PRN  . nitroGLYCERIN (NITROSTAT) 0.4 mg, Sublingual, Every 5 min x3 PRN  . omeprazole (PRILOSEC) 40 mg, Oral, Daily  . OVER THE COUNTER MEDICATION 1 application, Topical, As needed, Magnilife topicall muscle rub  . oxyCODONE-acetaminophen (PERCOCET) 7.5-325 MG tablet 1 tablet, Oral, Every 4 hours PRN  .  rOPINIRole (REQUIP) 1 mg, Oral, 3 times daily  . traZODone (DESYREL) 25-50 mg, Oral, At bedtime PRN    Radiology:  No results found.  Cardiac Studies:   Lower extremity venous insufficiency study 07/15/2017: Venous insufficiency of the right  great saphenous vein associated with communicating varicosities.  Small varicosities communicate in the proximal right short saphenous vein.  Mild varicosity of the left proximal calf that communicate with left GSB.  She does not demonstrate arterial insufficiency. Conservative therapy: Dr. Kathlene Cote (IR)   Peripheral arteriogram 07/16/2016:  Right SFA 95% stenosis distal. Otherwise 3 vessel R/O bilateral lower extremity.  Left renal artery 95% stenosis.   08/06/2016: Patient randomized in [facility] DCB Study: Successful PTA and drug coated balloon angioplasty with a 5.0 x 60 mm  Inpact Admiral balloon of the right mid SFA, stenosis reduced from 99% to 0%. Mild disease left.  Nuclear stress test  12/29/2017: 1. Lexiscan stress test was performed. Exercise capacity was not assessed. Stress symptoms included headache. Peak effect blood pressure was 172/68 mmHg. The resting and stress electrocardiogram demonstrated normal sinus rhythm, normal resting conduction, no resting arrhythmias and normal rest repolarization. Stress EKG is non diagnostic for ischemia as it is a pharmacologic stress. 2. The overall quality of the study is good. There is no evidence of abnormal lung activity. Stress SPECT images demonstrate homogeneous tracer distribution throughout the myocardium. Decreased tracer uptake in basal inferoseptal myocardium seen only on SPECT rest images likely represents tissue attenuation artifact. Gated SPECT imaging reveals normal myocardial thickening and wall motion. The left ventricular ejection fraction was normal (68%). 3. Low risk study.  Carotid artery duplex  12/23/2018: Stenosis in the right internal carotid artery (16-49%). Stenosis in the right external carotid artery (<50%). Stenosis in the left internal carotid artery (1-15%). Stenosis in the left external carotid artery (<50%). Antegrade right vertebral artery flow. Antegrade left vertebral artery flow. No significant change from  06/16/2018. Follow up in one year is appropriate if clinically indicated.  Assessment     ICD-10-CM   1. Mixed hyperlipidemia  E78.2   2. Primary hypertension  I10 losartan-hydrochlorothiazide (HYZAAR) 50-12.5 MG tablet  3. Claudication in peripheral vascular disease (HCC)  I73.9   4. Idiopathic peripheral neuropathy  G60.9     EKG 01/13/2019: Normal sinus rhythm at the rate of 85 bpm, left atrial enlargement, normal axis.  Poor R-wave progression, cannot exclude anteroseptal infarct old.  No evidence of ischemia.  Low-voltage complexes.  Pulmonary disease pattern. No significant change from  EKG 11/17/2017   Recommendations:   Meds ordered this encounter  Medications  . losartan-hydrochlorothiazide (HYZAAR) 50-12.5 MG tablet    Sig: TAKE 1 TABLET BY MOUTH EVERY DAY IN THE MORNING    Dispense:  90 tablet    Refill:  3    JOSEFINE FUHR  is a 69 y.o. Caucasian female with history of hypertension, hypertriglyceridemia, tobacco use quit in August 2020, prediabetes mellitus, PAD with history of right SFA angioplasty 08/06/2016 and no significant disease in the left leg, pseudoclaudication from spinal stenosis and also history of venous insufficiency being treated conservatively with resolution of leg edema with use of support stockings, presents here for follow-up of hyperlipidemia and hypertension.  Incidental left renal artery stenosis being managed medically.  Blood pressure is now well controlled, will refill losartan HCT. Amlodipine was also sent in last OV, but has refilled it and BP is controlled.    Lipids are improved since being on Lipitor 20 mg, however would  like to have LDL closer to 70.  Patient is planning to make lifestyle changes and also trying to lose weight, hence we will recheck that in 6 months and could consider addition of Zetia.  She already feels that she is taking too many medications.  Fortunately she has remained abstinent from tobacco use.  She was evaluated by  neurology, she also has peripheral neuropathy and also has spinal stenosis that may be contributing to her leg pain, ABI have been fairly stable and no clinical evidence of critical limb ischemia.  No changes in the medications today and I will see her back in 6 months.   Adrian Prows, MD, [facility] 02/23/2019, 3:37 PM [facility]. [place] Office: [phone]

## 2019-02-26 ENCOUNTER — Other Ambulatory Visit: Payer: Self-pay | Admitting: Cardiology

## 2019-02-26 DIAGNOSIS — I10 Essential (primary) hypertension: Secondary | ICD-10-CM

## 2019-03-30 ENCOUNTER — Other Ambulatory Visit: Payer: Self-pay | Admitting: Cardiology

## 2019-03-30 DIAGNOSIS — I10 Essential (primary) hypertension: Secondary | ICD-10-CM

## 2019-06-15 ENCOUNTER — Encounter: Payer: Self-pay | Admitting: *Deleted

## 2019-06-15 DIAGNOSIS — Z006 Encounter for examination for normal comparison and control in clinical research program: Secondary | ICD-10-CM

## 2019-06-15 NOTE — Research (Signed)
34M for [facility]  Phone visit  Patient doing well, no changes in her meds. No complaints of cp or sob. She continues to have bilateral leg pain at rest and with activity.  Per Dr Einar Gip last note:  She was evaluated by neurology, she also has peripheral neuropathy and also has spinal stenosis that may be contributing to her leg pain, ABI have been fairly stable and no clinical evidence of critical limb ischemia.   Research will speak with patient in a year.     Current Outpatient Medications:  .  albuterol (PROVENTIL HFA;VENTOLIN HFA) 108 (90 Base) MCG/ACT inhaler, Inhale 2 puffs into the lungs every 4 (four) hours as needed for wheezing or shortness of breath (cough)., Disp: 1 Inhaler, Rfl: 1 .  amLODipine (NORVASC) 5 MG tablet, TAKE 1 TABLET BY MOUTH EVERY DAY, Disp: 90 tablet, Rfl: 1 .  aspirin EC 81 MG tablet, Take 1 tablet (81 mg total) by mouth 2 (two) times daily. (Patient taking differently: Take 81 mg by mouth daily. ), Disp: 60 tablet, Rfl: 0 .  atorvastatin (LIPITOR) 20 MG tablet, Take 20 mg by mouth daily., Disp: , Rfl:  .  busPIRone (BUSPAR) 5 MG tablet, Take 1 tablet (5 mg total) by mouth 2 (two) times daily., Disp: 60 tablet, Rfl: 0 .  Diclofenac Sodium (DICLO GEL TD), Place onto the skin., Disp: , Rfl:  .  DULoxetine (CYMBALTA) 60 MG capsule, Take 60 mg by mouth daily after breakfast. , Disp: , Rfl:  .  gemfibrozil (LOPID) 600 MG tablet, Take 600 mg by mouth 2 (two) times daily before a meal., Disp: , Rfl:  .  losartan-hydrochlorothiazide (HYZAAR) 50-12.5 MG tablet, TAKE 1 TABLET BY MOUTH EVERY DAY IN THE MORNING, Disp: 90 tablet, Rfl: 3 .  methocarbamol (ROBAXIN) 500 MG tablet, Take 1 tablet (500 mg total) by mouth every 6 (six) hours as needed for muscle spasms. (Patient taking differently: Take 500 mg by mouth See admin instructions. Take one tablet (500 mg) by mouth daily at bedtime, may also take one tablet (500 mg) during the day as needed for muscle spasms), Disp: 60  tablet, Rfl: 0 .  nitroGLYCERIN (NITROSTAT) 0.4 MG SL tablet, Place 0.4 mg under the tongue every 5 (five) minutes x 3 doses as needed for chest pain. , Disp: , Rfl: 1 .  omeprazole (PRILOSEC) 40 MG capsule, Take 40 mg by mouth daily., Disp: , Rfl:  .  OVER THE COUNTER MEDICATION, Apply 1 application topically as needed (joint pain). Magnilife topicall muscle rub, Disp: , Rfl:  .  oxyCODONE-acetaminophen (PERCOCET) 7.5-325 MG tablet, Take 1 tablet by mouth every 4 (four) hours as needed for severe pain., Disp: 30 tablet, Rfl: 0 .  rOPINIRole (REQUIP) 1 MG tablet, Take 1 mg by mouth 3 (three) times daily. , Disp: , Rfl:  .  traZODone (DESYREL) 50 MG tablet, TAKE 0.5-1 TABLETS (25-50 MG TOTAL) BY MOUTH AT BEDTIME AS NEEDED FOR SLEEP., Disp: 90 tablet, Rfl: 1

## 2019-06-18 ENCOUNTER — Telehealth: Payer: Self-pay | Admitting: Physician Assistant

## 2019-06-18 MED ORDER — ONDANSETRON HCL 4 MG PO TABS: 4.0000 mg | ORAL_TABLET | Freq: Three times a day (TID) | ORAL | 2 refills | Status: AC | PRN

## 2019-06-18 NOTE — Telephone Encounter (Signed)
Prescription sent no answer no voice mail

## 2019-06-18 NOTE — Telephone Encounter (Signed)
Zofran 4mg  q4-6 hours prn nausea #30- refillx2  Thanks-[facility]

## 2019-06-18 NOTE — Telephone Encounter (Signed)
The patient has been notified of this information and all questions answered.

## 2019-06-18 NOTE — Telephone Encounter (Signed)
Pt calling with nausea and vomiting, requesting something for nausea. Please advise.

## 2019-07-14 ENCOUNTER — Encounter: Payer: Self-pay | Admitting: Physician Assistant

## 2019-07-14 ENCOUNTER — Ambulatory Visit: Payer: Medicare Other | Admitting: Physician Assistant

## 2019-07-14 VITALS — BP 142/72 | HR 105 | Ht 61.0 in | Wt 196.0 lb

## 2019-07-14 DIAGNOSIS — R110 Nausea: Secondary | ICD-10-CM | POA: Diagnosis not present

## 2019-07-14 DIAGNOSIS — R1013 Epigastric pain: Secondary | ICD-10-CM | POA: Diagnosis not present

## 2019-07-14 DIAGNOSIS — R142 Eructation: Secondary | ICD-10-CM

## 2019-07-14 DIAGNOSIS — K219 Gastro-esophageal reflux disease without esophagitis: Secondary | ICD-10-CM | POA: Diagnosis not present

## 2019-07-14 MED ORDER — PANTOPRAZOLE SODIUM 40 MG PO TBEC: 40.0000 mg | DELAYED_RELEASE_TABLET | Freq: Two times a day (BID) | ORAL | 3 refills | Status: DC

## 2019-07-14 NOTE — Progress Notes (Signed)
Chief Complaint: Nausea, epigastric pain  HPI:    [name] is a 69 year old female with a past medical history as listed below including chronic anticoagulation with Plavix and aspirin, known to Dr. Henrene Pastor, who was referred to me by Alvester Chou, NP for a complaint of nausea and epigastric pain.      08/22/2016 patient seen in clinic for anemia.  That time most recent CBC 01/15/2016 with a hemoglobin of 10.9 which is decreased from 11.2 a year prior.  At that time described dysphagia as well as nausea after eating almost anything.  Schedule patient for an EGD and colonoscopy.  Also prescribed Omeprazole 40 twice daily.    10/22/2016 EGD with LA grade a reflux esophagitis.  Colonoscopy on the same day with 2 3-4 mm polyps in the transverse and ascending colon, internal hemorrhoids and diverticulosis in the sigmoid colon.  Pathology with tubular adenomas.  Repeat recommended in 5 years.    06/18/2019 patient called in and complained of nausea and vomiting, given Zofran 4 mg every 4-6 hours.    Today, the patient tells me over the past year she has had increasing indigestion with almost constant epigastric pain and bloating telling me that when she lays down at night the right side of her abdomen always looks bigger than the left, she feels like there is a "little devil named Iona Beard poking me in there".  Describes a lot of belching and tells me sometimes this can smell like food she ate days ago or smell like "poop".  Associated symptoms include nausea which is some better with the Zofran 4 mg as needed.  She is currently using Omeprazole 40 mg daily but also eating Tums and Rolaids as much as she can.    Patient wants to go on vacation to [facility] but wants to feel better first.    Denies fever, chills, weight loss, change in bowel habits, melena or symptoms that awaken her from sleep.  Past Medical History:  Diagnosis Date  . Anxiety   . Aortic atherosclerosis ([place])   . Bilateral cataracts   .  Carpal tunnel syndrome   . Cervical cancer ([place])   . Chronic back pain   . Claudication ([place])   . COPD (chronic obstructive pulmonary disease) ([facility])    wheezing  . DDD (degenerative disc disease), cervical   . DDD (degenerative disc disease), lumbar   . Depression   . Diverticulosis   . Gastroparesis   . GERD (gastroesophageal reflux disease)   . Hearing loss    Bilateral  . History of blood transfusion   . History of colon polyps   . History of migraine   . Hypertension   . Internal hemorrhoids   . Iron deficiency anemia   . Left-sided weakness   . Leg swelling   . Liver disease    pt unaware  . Lung nodule    several small  . OA (osteoarthritis)    both hands  . Positive ANA (antinuclear antibody)   . Pre-diabetes   . Raynaud's phenomenon   . Restless leg   . Seizures ([place])    no meds for 8 months  . Sleep apnea    does not use her cpap  . Spondylosis   . Stroke [facility]) 2015   no weakness  . 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   rt  . Urge incontinence of urine   . Varicose vein of leg   . Venous insufficiency     Past  Surgical History:  Procedure Laterality Date  . BACK SURGERY    . BLEPHAROPLASTY  10/10/2017  . CATARACT EXTRACTION, BILATERAL    . CESAREAN SECTION    . CHOLECYSTECTOMY    . COLONOSCOPY  10/2016  . HAND SURGERY Bilateral    carpal tunnel  . HEMORRHOID SURGERY    . INTERSTIM IMPLANT PLACEMENT    . KNEE SURGERY Left    arthroscopy x3  . LOWER EXTREMITY ANGIOGRAPHY N/A 07/16/2016   Procedure: Lower Extremity Angiography;  Surgeon: Adrian Prows, MD;  Location: [facility];  Service: Cardiovascular;  Laterality: N/A;  . LOWER EXTREMITY INTERVENTION N/A 08/06/2016   Procedure: Lower Extremity Intervention;  Surgeon: Adrian Prows, MD;  Location: [place] [facility];  Service: Cardiovascular;  Laterality: N/A;  . NECK SURGERY    . OTHER SURGICAL HISTORY  2013   bladder stimulator in back  . PERIPHERAL VASCULAR BALLOON ANGIOPLASTY  08/06/2016    Procedure: Peripheral Vascular Balloon Angioplasty;  Surgeon: Adrian Prows, MD;  Location: [place] [facility];  Service: Cardiovascular;;  Right SFA  . TENNIS ELBOW RELEASE/NIRSCHEL PROCEDURE Right 12/2015  . TOE SURGERY Right    right foot second and fourth toe  . TOTAL HIP ARTHROPLASTY Left   . TOTAL HIP REVISION Left 10/13/2017   Procedure: LEFT TOTAL HIP REVISION ACETABULUM, OPEN REDUCTION INTERNAL WITH BONE GRAFT FIXATION LEFT GREATER TROCHANTERIC FRACTURE;  Surgeon: Frederik Pear, MD;  Location: [facility];  Service: Orthopedics;  Laterality: Left;  . TOTAL KNEE ARTHROPLASTY Right 01/12/2016   Procedure: RIGHT TOTAL KNEE ARTHROPLASTY;  Surgeon: Netta Cedars, MD;  Location: [facility];  Service: Orthopedics;  Laterality: Right;  . TUBAL LIGATION    . UPPER GI ENDOSCOPY  10/2016  . VAGINAL HYSTERECTOMY      Current Outpatient Medications  Medication Sig Dispense Refill  . albuterol (PROVENTIL HFA;VENTOLIN HFA) 108 (90 Base) MCG/ACT inhaler Inhale 2 puffs into the lungs every 4 (four) hours as needed for wheezing or shortness of breath (cough). 1 Inhaler 1  . amLODipine (NORVASC) 5 MG tablet TAKE 1 TABLET BY MOUTH EVERY DAY 90 tablet 1  . aspirin EC 81 MG tablet Take 1 tablet (81 mg total) by mouth 2 (two) times daily. (Patient taking differently: Take 81 mg by mouth daily. ) 60 tablet 0  . atorvastatin (LIPITOR) 20 MG tablet Take 20 mg by mouth daily.    . busPIRone (BUSPAR) 5 MG tablet Take 1 tablet (5 mg total) by mouth 2 (two) times daily. 60 tablet 0  . Diclofenac Sodium (DICLO GEL TD) Place onto the skin.    . DULoxetine (CYMBALTA) 60 MG capsule Take 60 mg by mouth daily after breakfast.     . gemfibrozil (LOPID) 600 MG tablet Take 600 mg by mouth 2 (two) times daily before a meal.    . losartan-hydrochlorothiazide (HYZAAR) 50-12.5 MG tablet TAKE 1 TABLET BY MOUTH EVERY DAY IN THE MORNING 90 tablet 3  . methocarbamol (ROBAXIN) 500 MG tablet Take 1 tablet (500 mg total) by mouth every 6 (six)  hours as needed for muscle spasms. (Patient taking differently: Take 500 mg by mouth See admin instructions. Take one tablet (500 mg) by mouth daily at bedtime, may also take one tablet (500 mg) during the day as needed for muscle spasms) 60 tablet 0  . nitroGLYCERIN (NITROSTAT) 0.4 MG SL tablet Place 0.4 mg under the tongue every 5 (five) minutes x 3 doses as needed for chest pain.   1  .  omeprazole (PRILOSEC) 40 MG capsule Take 40 mg by mouth daily.    . ondansetron (ZOFRAN) 4 MG tablet Take 1 tablet (4 mg total) by mouth every 8 (eight) hours as needed for nausea or vomiting. 30 tablet 2  . OVER THE COUNTER MEDICATION Apply 1 application topically as needed (joint pain). Magnilife topicall muscle rub    . oxyCODONE-acetaminophen (PERCOCET) 7.5-325 MG tablet Take 1 tablet by mouth every 4 (four) hours as needed for severe pain. 30 tablet 0  . rOPINIRole (REQUIP) 1 MG tablet Take 1 mg by mouth 3 (three) times daily.     . traZODone (DESYREL) 50 MG tablet TAKE 0.5-1 TABLETS (25-50 MG TOTAL) BY MOUTH AT BEDTIME AS NEEDED FOR SLEEP. 90 tablet 1   No current facility-administered medications for this visit.    Allergies as of 07/14/2019 - Review Complete 02/23/2019  Allergen Reaction Noted  . Bee venom Anaphylaxis 12/28/2015  . Gabapentin Other (See Comments) 10/14/2017  . Morphine and related Other (See Comments) 07/12/2016  . Codeine Hives 02/20/2017  . Adhesive [tape] Other (See Comments) 08/02/2016  . Sulfa antibiotics Other (See Comments) 09/02/2014  . Sulfonamide derivatives Other (See Comments) 02/20/2017    Family History  Problem Relation Age of Onset  . Hyperlipidemia Mother   . Hypertension Mother   . Anxiety disorder Mother   . Depression Mother   . Hyperlipidemia Father   . Hypertension Father   . Alzheimer's disease Father   . Anxiety disorder Brother   . Cancer Daughter        unknown  . Anxiety disorder Brother   . Anxiety disorder Brother   . Drug abuse Brother   .  Stroke Brother   . Colon cancer Neg Hx   . Esophageal cancer Neg Hx   . Rectal cancer Neg Hx   . Stomach cancer Neg Hx     Social History   Socioeconomic History  . Marital status: Married    Spouse name: sammy  . Number of children: 1  . Years of education: Not on file  . Highest education level: Associate degree: occupational, Hotel manager, or vocational program  Occupational History  . Not on file  Tobacco Use  . Smoking status: Current Every Day Smoker    Packs/day: 1.00    Years: 55.00    Pack years: 55.00    Types: Cigarettes  . Smokeless tobacco: Never Used  Vaping Use  . Vaping Use: Never used  Substance and Sexual Activity  . Alcohol use: No    Alcohol/week: 0.0 standard drinks  . Drug use: Not Currently  . Sexual activity: Not Currently  Other Topics Concern  . Not on file  Social History Narrative  . Not on file   Social Determinants of Health   Financial Resource Strain:   . Difficulty of Paying Living Expenses:   Food Insecurity:   . Worried About Charity fundraiser in the Last Year:   . Arboriculturist in the Last Year:   Transportation Needs:   . Film/video editor (Medical):   Marland Kitchen Lack of Transportation (Non-Medical):   Physical Activity:   . Days of Exercise per Week:   . Minutes of Exercise per Session:   Stress:   . Feeling of Stress :   Social Connections:   . Frequency of Communication with Friends and Family:   . Frequency of Social Gatherings with Friends and Family:   . Attends Religious Services:   . Active Member  of Clubs or Organizations:   . Attends Archivist Meetings:   Marland Kitchen Marital Status:   Intimate Partner Violence:   . Fear of Current or Ex-Partner:   . Emotionally Abused:   Marland Kitchen Physically Abused:   . Sexually Abused:     Review of Systems:    Constitutional: No weight loss, fever or chills Cardiovascular: No chest pain   Respiratory: No SOB  Gastrointestinal: See HPI and otherwise negative   Physical Exam:    Vital signs: BP (!) 142/72   Pulse (!) 105   Ht 5\' 1"  (1.549 m)   Wt 196 lb (88.9 kg)   BMI 37.03 kg/m   Constitutional:   Pleasant obese Caucasian female appears to be in NAD, Well developed, Well nourished, alert and cooperative Respiratory: Respirations even and unlabored. Lungs clear to auscultation bilaterally.   No wheezes, crackles, or rhonchi.  Cardiovascular: Normal S1, S2. No MRG. Regular rate and rhythm. No peripheral edema, cyanosis or pallor.  Gastrointestinal:  Soft, nondistended, moderate epigastric ttp. No rebound or guarding. Normal bowel sounds. No appreciable masses or hepatomegaly. Rectal:  Not performed.  Psychiatric: Demonstrates good judgement and reason without abnormal affect or behaviors.  MOST RECENT LABS AND IMAGING: CBC    Component Value Date/Time   WBC 5.8 02/03/2019 0904   WBC 6.3 07/30/2018 1313   WBC 5.5 10/31/2017 1600   RBC 5.12 02/03/2019 0904   RBC 4.93 07/30/2018 1313   HGB 13.5 02/03/2019 0904   HCT 41.6 02/03/2019 0904   PLT 309 02/03/2019 0904   MCV 81 02/03/2019 0904   MCH 26.4 (L) 02/03/2019 0904   MCH 27.8 07/30/2018 1313   MCHC 32.5 02/03/2019 0904   MCHC 32.5 07/30/2018 1313   RDW 14.7 02/03/2019 0904   LYMPHSABS 1.8 07/30/2018 1313   MONOABS 0.6 07/30/2018 1313   EOSABS 0.3 07/30/2018 1313   BASOSABS 0.0 07/30/2018 1313    CMP     Component Value Date/Time   NA 138 02/03/2019 0904   K 4.4 02/03/2019 0904   K 3.5 03/19/2011 0722   CL 99 02/03/2019 0904   CO2 26 02/03/2019 0904   GLUCOSE 115 (H) 02/03/2019 0904   GLUCOSE 99 10/31/2017 1600   BUN 14 02/03/2019 0904   CREATININE 0.74 02/03/2019 0904   CALCIUM 9.4 02/03/2019 0904   PROT 6.9 02/03/2019 0904   ALBUMIN 4.3 02/03/2019 0904   AST 20 02/03/2019 0904   ALT 22 02/03/2019 0904   ALKPHOS 145 (H) 02/03/2019 0904   BILITOT 0.2 02/03/2019 0904   GFRNONAA 83 02/03/2019 0904   GFRAA 96 02/03/2019 0904    Assessment: 1.  Epigastric pain: History of reflux  esophagitis on last EGD in 2018, most likely this represents gastritis+/-PUD vs gastroparesis 2.  Nausea: With above 3.  Eructations  Plan: 1.  Ordered a gastric emptying study for further evaluation of possible gastroparesis giving foul-smelling burps. 2.  Stop Omeprazole.  Prescribed Pantoprazole 40 mg twice daily, 30-60 minutes before breakfast and dinner #60 with 5 refills. 3.  Reviewed and reflux diet and lifestyle modifications. 4.  Told the patient to contact us after week if the Pantoprazole is not helping, if it is not then would recommend adding Pepcid 40 mg every morning and nightly. 5.  Patient to follow in clinic with me in 3 to 4 weeks or sooner if necessary.  [name], PA-C [facility] 07/14/2019, 2:45 PM  Cc: Alvester Chou, NP

## 2019-07-14 NOTE — Patient Instructions (Signed)
If you are age 70 or older, your body mass index should be between 23-30. Your Body mass index is 37.03 kg/m. If this is out of the aforementioned range listed, please consider follow up with your Primary Care Provider.  If you are age 69 or younger, your body mass index should be between 19-25. Your Body mass index is 37.03 kg/m. If this is out of the aformentioned range listed, please consider follow up with your Primary Care Provider.   __________________________________________________________________  We have sent the following medications to your pharmacy for you to pick up at your convenience: Pantoprazole 40 mg twice daily 30-60 minutes before breakfast and dinner.   Stop Omeprazole.  Call with an update in 1 week no better, ask for Koren Shiver, RN. __________________________________________________________________  Dennis Bast have been scheduled for a gastric emptying scan at [facility] on Thursday 07/29/19 at 7 am. Please arrive at least 15 minutes prior to your appointment for registration. Please make certain not to have anything to eat or drink after midnight the night before your test. Hold all stomach medications (ex: Zofran, phenergan, Reglan) 48 hours prior to your test. If you need to reschedule your appointment, please contact radiology scheduling at [phone]. _________________________________________________________________   A gastric-emptying study measures how long it takes for food to move through your stomach. There are several ways to measure stomach emptying. In the most common test, you eat food that contains a small amount of radioactive material. A scanner that detects the movement of the radioactive material is placed over your abdomen to monitor the rate at which food leaves your stomach. This test normally takes about 4 hours to complete. __________________________________________________________________

## 2019-07-15 NOTE — Progress Notes (Signed)
PA assessment and plan reviewed

## 2019-07-19 ENCOUNTER — Other Ambulatory Visit: Payer: Self-pay | Admitting: Cardiology

## 2019-07-19 DIAGNOSIS — I10 Essential (primary) hypertension: Secondary | ICD-10-CM

## 2019-07-29 ENCOUNTER — Other Ambulatory Visit: Payer: Self-pay

## 2019-07-29 ENCOUNTER — Ambulatory Visit (HOSPITAL_COMMUNITY)
Admission: RE | Admit: 2019-07-29 | Discharge: 2019-07-29 | Disposition: A | Discharge status: Home / Self Care | Payer: Medicare Other | Source: Ambulatory Visit | Attending: Physician Assistant | Admitting: Physician Assistant

## 2019-07-29 DIAGNOSIS — R110 Nausea: Secondary | ICD-10-CM

## 2019-07-29 MED ORDER — TECHNETIUM TC 99M SULFUR COLLOID
2.0000 | Freq: Once | INTRAVENOUS | Status: AC | PRN
  Administered 2019-07-29: 2 via INTRAVENOUS

## 2019-08-23 ENCOUNTER — Encounter: Payer: Self-pay | Admitting: Cardiology

## 2019-08-23 ENCOUNTER — Other Ambulatory Visit: Payer: Self-pay

## 2019-08-23 ENCOUNTER — Ambulatory Visit: Payer: Medicare Other | Admitting: Cardiology

## 2019-08-23 VITALS — BP 119/59 | HR 83 | Resp 16 | Ht 61.0 in | Wt 196.0 lb

## 2019-08-23 DIAGNOSIS — I739 Peripheral vascular disease, unspecified: Secondary | ICD-10-CM

## 2019-08-23 DIAGNOSIS — E782 Mixed hyperlipidemia: Secondary | ICD-10-CM

## 2019-08-23 DIAGNOSIS — I6523 Occlusion and stenosis of bilateral carotid arteries: Secondary | ICD-10-CM

## 2019-08-23 DIAGNOSIS — I10 Essential (primary) hypertension: Secondary | ICD-10-CM

## 2019-08-23 NOTE — Progress Notes (Signed)
Primary Physician/Referring:  Alvester Chou, NP  Patient ID: [name], female    DOB: 01/24/1950, 69 y.o.   MRN: [id number]  Chief Complaint  Patient presents with  . PAD  . Hypertension  . Hyperlipidemia  . Follow-up    6 month   HPI:    [name]  is a 69 y.o. Caucasian female with history of hypertension, hypertriglyceridemia, tobacco use quit in August 2020, prediabetes mellitus, PAD with history of right SFA angioplasty 08/06/2016 and no significant disease in the left leg, pseudoclaudication from spinal stenosis and also history of venous insufficiency being treated conservatively with resolution of leg edema with use of support stockings, presents here for follow-up of hyperlipidemia and hypertension.  Incidental left renal artery stenosis being managed medically.  [place] f/u of leg pain, hypertension and hyperlipidemia.  She has not had any chest pain but does have chronic dyspnea.  She is presently doing well.  Symptoms of claudication are remained stable.  Past Medical History:  Diagnosis Date  . Anxiety   . Aortic atherosclerosis ([place])   . Bilateral cataracts   . Carpal tunnel syndrome   . Cervical cancer ([place])   . Chronic back pain   . Claudication ([place])   . COPD (chronic obstructive pulmonary disease) (HCC)    wheezing  . DDD (degenerative disc disease), cervical   . DDD (degenerative disc disease), lumbar   . Depression   . Diverticulosis   . Gastroparesis   . GERD (gastroesophageal reflux disease)   . Hearing loss    Bilateral  . History of blood transfusion   . History of colon polyps   . History of migraine   . Hypertension   . Internal hemorrhoids   . Iron deficiency anemia   . Left-sided weakness   . Leg swelling   . Liver disease    pt unaware  . Lung nodule    several small  . OA (osteoarthritis)    both hands  . Positive ANA (antinuclear antibody)   . Pre-diabetes   . Raynaud's phenomenon   . Restless leg   . Seizures ([place])     no meds for 8 months  . Sleep apnea    does not use her cpap  . Spondylosis   . Stroke [facility]) 2015   no weakness  . 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   rt  . Urge incontinence of urine   . Varicose vein of leg   . Venous insufficiency    Past Surgical History:  Procedure Laterality Date  . BACK SURGERY    . BLEPHAROPLASTY  10/10/2017  . CATARACT EXTRACTION, BILATERAL    . CESAREAN SECTION    . CHOLECYSTECTOMY    . COLONOSCOPY  10/2016  . HAND SURGERY Bilateral    carpal tunnel  . HEMORRHOID SURGERY    . INTERSTIM IMPLANT PLACEMENT    . KNEE SURGERY Left    arthroscopy x3  . LOWER EXTREMITY ANGIOGRAPHY N/A 07/16/2016   Procedure: Lower Extremity Angiography;  Surgeon: Adrian Prows, MD;  Location: [place] [facility];  Service: Cardiovascular;  Laterality: N/A;  . LOWER EXTREMITY INTERVENTION N/A 08/06/2016   Procedure: Lower Extremity Intervention;  Surgeon: Adrian Prows, MD;  Location: [place] [facility];  Service: Cardiovascular;  Laterality: N/A;  . NECK SURGERY    . OTHER SURGICAL HISTORY  2013   bladder stimulator in back  . PERIPHERAL VASCULAR BALLOON ANGIOPLASTY  08/06/2016   Procedure: Peripheral Vascular Balloon Angioplasty;  Surgeon:  Adrian Prows, MD;  Location: [place] [facility];  Service: Cardiovascular;;  Right SFA  . TENNIS ELBOW RELEASE/NIRSCHEL PROCEDURE Right 12/2015  . TOE SURGERY Right    right foot second and fourth toe  . TOTAL HIP ARTHROPLASTY Left   . TOTAL HIP REVISION Left 10/13/2017   Procedure: LEFT TOTAL HIP REVISION ACETABULUM, OPEN REDUCTION INTERNAL WITH BONE GRAFT FIXATION LEFT GREATER TROCHANTERIC FRACTURE;  Surgeon: Frederik Pear, MD;  Location: [facility];  Service: Orthopedics;  Laterality: Left;  . TOTAL KNEE ARTHROPLASTY Right 01/12/2016   Procedure: RIGHT TOTAL KNEE ARTHROPLASTY;  Surgeon: Netta Cedars, MD;  Location: [place];  Service: Orthopedics;  Laterality: Right;  . TUBAL LIGATION    . UPPER GI ENDOSCOPY  10/2016  . VAGINAL HYSTERECTOMY      Social History   Tobacco Use  . Smoking status: Current Every Day Smoker    Packs/day: 1.00    Years: 55.00    Pack years: 55.00    Types: Cigarettes  . Smokeless tobacco: Never Used  Substance Use Topics  . Alcohol use: No    Alcohol/week: 0.0 standard drinks    ROS  Review of Systems  Constitutional: Positive for weight gain.  Cardiovascular: Positive for claudication (neurogenic) and dyspnea on exertion. Negative for leg swelling and syncope.  Endocrine: Negative for cold intolerance.  Hematologic/Lymphatic: Does not bruise/bleed easily.  Musculoskeletal: Positive for arthritis, back pain, joint pain and neck pain. Negative for joint swelling.  Gastrointestinal: Negative for hematochezia and melena.  Neurological: Positive for dizziness (occasional and chronic). Negative for headaches and light-headedness.  Psychiatric/Behavioral: Negative for depression and substance abuse.  All other systems reviewed and are negative.  Objective  Blood pressure (!) 119/59, pulse 83, resp. rate 16, height _0  (1.549 m), weight 196 lb (88.9 kg), SpO2 94 %.  Vitals with BMI 08/23/2019 07/14/2019 02/23/2019  Height _1  _2  _3   Weight 196 lbs 196 lbs 205 lbs  BMI 37.05 84.13 24.40  Systolic 102 725 366  Diastolic 59 72 76  Pulse 83 105 98  Some encounter information is confidential and restricted. Go to Review Flowsheets activity to see all data.     Physical Exam Constitutional:      Comments: She has short stitch her and moderately obese in no acute distress.  Eyes:     Conjunctiva/sclera: Conjunctivae normal.  Neck:     Thyroid: No thyromegaly.     Vascular: No JVD.  Cardiovascular:     Rate and Rhythm: Normal rate and regular rhythm.     Pulses: Intact distal pulses.          Carotid pulses are on the right side with bruit and on the left side with bruit.      Femoral pulses are 2+ on the right side and 2+ on the left side.      Popliteal pulses are 2+ on the right side  and 2+ on the left side.       Dorsalis pedis pulses are 2+ on the right side and 2+ on the left side.       Posterior tibial pulses are 2+ on the right side and 2+ on the left side.     Heart sounds: Normal heart sounds. No murmur heard.  No gallop.      Comments: No leg edema, no JVD. Superficial varicosities noted.  Normal vascular exam with bilateral carotid bruit. Pulmonary:     Effort: Pulmonary effort is normal.  Breath sounds: Normal breath sounds.  Abdominal:     General: Bowel sounds are normal.     Palpations: Abdomen is soft.     Comments: Obese   Musculoskeletal:        General: Normal range of motion.     Cervical back: Neck supple.  Skin:    General: Skin is warm and dry.  Neurological:     Mental Status: She is alert.    Laboratory examination:   Recent Labs    02/03/19 0904  NA 138  K 4.4  CL 99  CO2 26  GLUCOSE 115*  BUN 14  CREATININE 0.74  CALCIUM 9.4  GFRNONAA 83  GFRAA 96   CrCl cannot be calculated (Patient's most recent lab result is older than the maximum 21 days allowed.).  CMP Latest Ref Rng & Units 02/03/2019 02/19/2018 10/31/2017  Glucose 65 - 99 mg/dL 115(H) 110(H) 99  BUN 8 - 27 mg/dL _0 Creatinine 0.57 - 1.00 mg/dL 0.74 0.75 0.89  Sodium 134 - 144 mmol/L 138 138 134(L)  Potassium 3.5 - 5.2 mmol/L 4.4 4.2 3.9  Chloride 96 - 106 mmol/L 99 98 103  CO2 20 - 29 mmol/L _1 Calcium 8.7 - 10.3 mg/dL 9.4 9.2 8.8(L)  Total Protein 6.0 - 8.5 g/dL 6.9 6.6 6.3(L)  Total Bilirubin 0.0 - 1.2 mg/dL 0.2 <0.2 0.1(L)  Alkaline Phos 39 - 117 IU/L 145(H) 131(H) 137(H)  AST 0 - 40 IU/L _2 ALT 0 - 32 IU/L _3 CBC Latest Ref Rng & Units 02/03/2019 07/30/2018 11/27/2017  WBC 3.4 - 10.8 x10E3/uL 5.8 6.3 4.9  Hemoglobin 11.1 - 15.9 g/dL 13.5 13.7 10.2(L)  Hematocrit 34.0 - 46.6 % 41.6 42.1 32.5(L)  Platelets 150 - 450 x10E3/uL 309 226 177   Lipid Panel Recent Labs    02/03/19 0904  CHOL 169  TRIG 163*  LDLCALC 100*  HDL  40    HEMOGLOBIN A1C Lab Results  Component Value Date   HGBA1C 6.1 (H) 01/02/2016   MPG 128 01/02/2016   TSH Recent Labs    02/03/19 0904  TSH 2.430   Medications and allergies   Allergies  Allergen Reactions  . Bee Venom Anaphylaxis  . Gabapentin Other (See Comments)    "Bad dreams"  . Morphine And Related Other (See Comments)    Overly sedated  . Codeine Hives  . Adhesive [Tape] Other (See Comments)    Redness, Paper tape is ok  . Sulfa Antibiotics Other (See Comments)    Unknown childhood reaction  . Sulfonamide Derivatives Other (See Comments)    Unknown childhood reaction     Current Outpatient Medications  Medication Instructions  . albuterol (PROVENTIL HFA;VENTOLIN HFA) 108 (90 Base) MCG/ACT inhaler 2 puffs, Inhalation, Every 4 hours PRN  . amLODipine (NORVASC) 5 MG tablet TAKE 1 TABLET BY MOUTH EVERY DAY  . aspirin EC 81 mg, Oral, 2 times daily  . atorvastatin (LIPITOR) 20 mg, Oral, Daily  . busPIRone (BUSPAR) 5 mg, Oral, 2 times daily  . Diclofenac Sodium (DICLO GEL TD) Transdermal  . DULoxetine (CYMBALTA) 60 mg, Oral, Daily after breakfast  . gemfibrozil (LOPID) 600 mg, Oral, 2 times daily before meals  . losartan-hydrochlorothiazide (HYZAAR) 50-12.5 MG tablet TAKE 1 TABLET BY MOUTH EVERY DAY IN THE MORNING  . methocarbamol (ROBAXIN) 500 mg, Oral, Every 6 hours PRN  . nitroGLYCERIN (NITROSTAT) 0.4 mg, Sublingual, Every 5 min x3 PRN  .  OVER THE COUNTER MEDICATION 1 application, Topical, As needed, Magnilife topicall muscle rub  . oxyCODONE-acetaminophen (PERCOCET) 7.5-325 MG tablet 1 tablet, Oral, Every 4 hours PRN  . pantoprazole (PROTONIX) 40 mg, Oral, 2 times daily before meals  . rOPINIRole (REQUIP) 1 mg, Oral, 3 times daily  . traZODone (DESYREL) 25-50 mg, Oral, At bedtime PRN    Radiology:  No results found.  Cardiac Studies:   Lower extremity venous insufficiency study 07/15/2017: Venous insufficiency of the right great saphenous vein  associated with communicating varicosities.  Small varicosities communicate in the proximal right short saphenous vein.  Mild varicosity of the left proximal calf that communicate with left GSB.  She does not demonstrate arterial insufficiency. Conservative therapy: Dr. Kathlene Cote (IR)   Peripheral arteriogram 07/16/2016:  Right SFA 95% stenosis distal. Otherwise 3 vessel R/O bilateral lower extremity.  Left renal artery 95% stenosis.   08/06/2016: Patient randomized in [facility] DCB Study: Successful PTA and drug coated balloon angioplasty with a 5.0 x 60 mm  Inpact Admiral balloon of the right mid SFA, stenosis reduced from 99% to 0%. Mild disease left.  Nuclear stress test  12/29/2017: 1. Lexiscan stress test was performed. Exercise capacity was not assessed. Stress symptoms included headache. Peak effect blood pressure was 172/68 mmHg. The resting and stress electrocardiogram demonstrated normal sinus rhythm, normal resting conduction, no resting arrhythmias and normal rest repolarization. Stress EKG is non diagnostic for ischemia as it is a pharmacologic stress. 2. The overall quality of the study is good. There is no evidence of abnormal lung activity. Stress SPECT images demonstrate homogeneous tracer distribution throughout the myocardium. Decreased tracer uptake in basal inferoseptal myocardium seen only on SPECT rest images likely represents tissue attenuation artifact. Gated SPECT imaging reveals normal myocardial thickening and wall motion. The left ventricular ejection fraction was normal (68%). 3. Low risk study.  Carotid artery duplex  12/23/2018: Stenosis in the right internal carotid artery (16-49%). Stenosis in the right external carotid artery (<50%). Stenosis in the left internal carotid artery (1-15%). Stenosis in the left external carotid artery (<50%). Antegrade right vertebral artery flow. Antegrade left vertebral artery flow. No significant change from 06/16/2018. Follow up in  one year is appropriate if clinically indicated.  EKG:  EKG 08/23/2019: Normal sinus rhythm at rate of 85 bpm, left atrial enlargement, normal axis.  Poor R wave progression, cannot exclude anteroseptal infarct old.  Low-voltage complexes.  Pulmonary disease pattern.  No significant change from 01/13/2019.  Assessment     ICD-10-CM   1. Primary hypertension  I10 EKG 12-Lead    CMP14+EGFR    CBC    Fe+TIBC+Fer  2. Claudication in peripheral vascular disease (HCC)  I73.9   3. Asymptomatic bilateral carotid artery stenosis  I65.23   4. Mixed hyperlipidemia  E78.2 Lipid Panel With LDL/HDL Ratio    EKG 01/13/2019: Normal sinus rhythm at the rate of 85 bpm, left atrial enlargement, normal axis.  Poor R-wave progression, cannot exclude anteroseptal infarct old.  No evidence of ischemia.  Low-voltage complexes.  Pulmonary disease pattern. No significant change from  EKG 11/17/2017   Recommendations:   No orders of the defined types were placed in this encounter.   OLA RAAP  is a 69 y.o. Caucasian female with history of hypertension, hypertriglyceridemia, tobacco use quit in August 2020, prediabetes mellitus, PAD with history of right SFA angioplasty 08/06/2016 and no significant disease in the left leg, pseudoclaudication from spinal stenosis and also history of venous insufficiency being treated conservatively  with resolution of leg edema with use of support stockings, presents here for follow-up of hyperlipidemia and hypertension.  Incidental left renal artery stenosis being managed medically.  She is presently doing well, no symptoms of claudication, no clinical evidence of heart failure, blood pressure is well controlled.  Previously her lipids had been elevated in January 2021, I will repeat lipid profile testing.  Patient has developed craving for ice, thinks she may have again developed iron deficiency anemia.  (Pagophagia)I will obtain CBC along with iron studies.  I will also obtain  a CMP to follow-up on renal function.  She has started to smoke again, although smoking less, I have again discussed with her regarding complete abstinence from tobacco use.  Adrian Prows, MD, [facility] 08/23/2019, 11:49 AM [facility]. [place] Office: [phone]

## 2019-09-10 ENCOUNTER — Ambulatory Visit: Payer: Medicare Other | Admitting: Physician Assistant

## 2019-09-25 IMAGING — DX DG HIP (WITH OR WITHOUT PELVIS) 1V PORT*L*
1 series · 1 of 1 positions shown · non-contrast
Comparison: Fluoro spot images of today's date

CLINICAL DATA: Status post left total hip joint prosthesis
placement.

EXAM:
DG HIP (WITH OR WITHOUT PELVIS) 1V PORT LEFT

[pelvis lat]
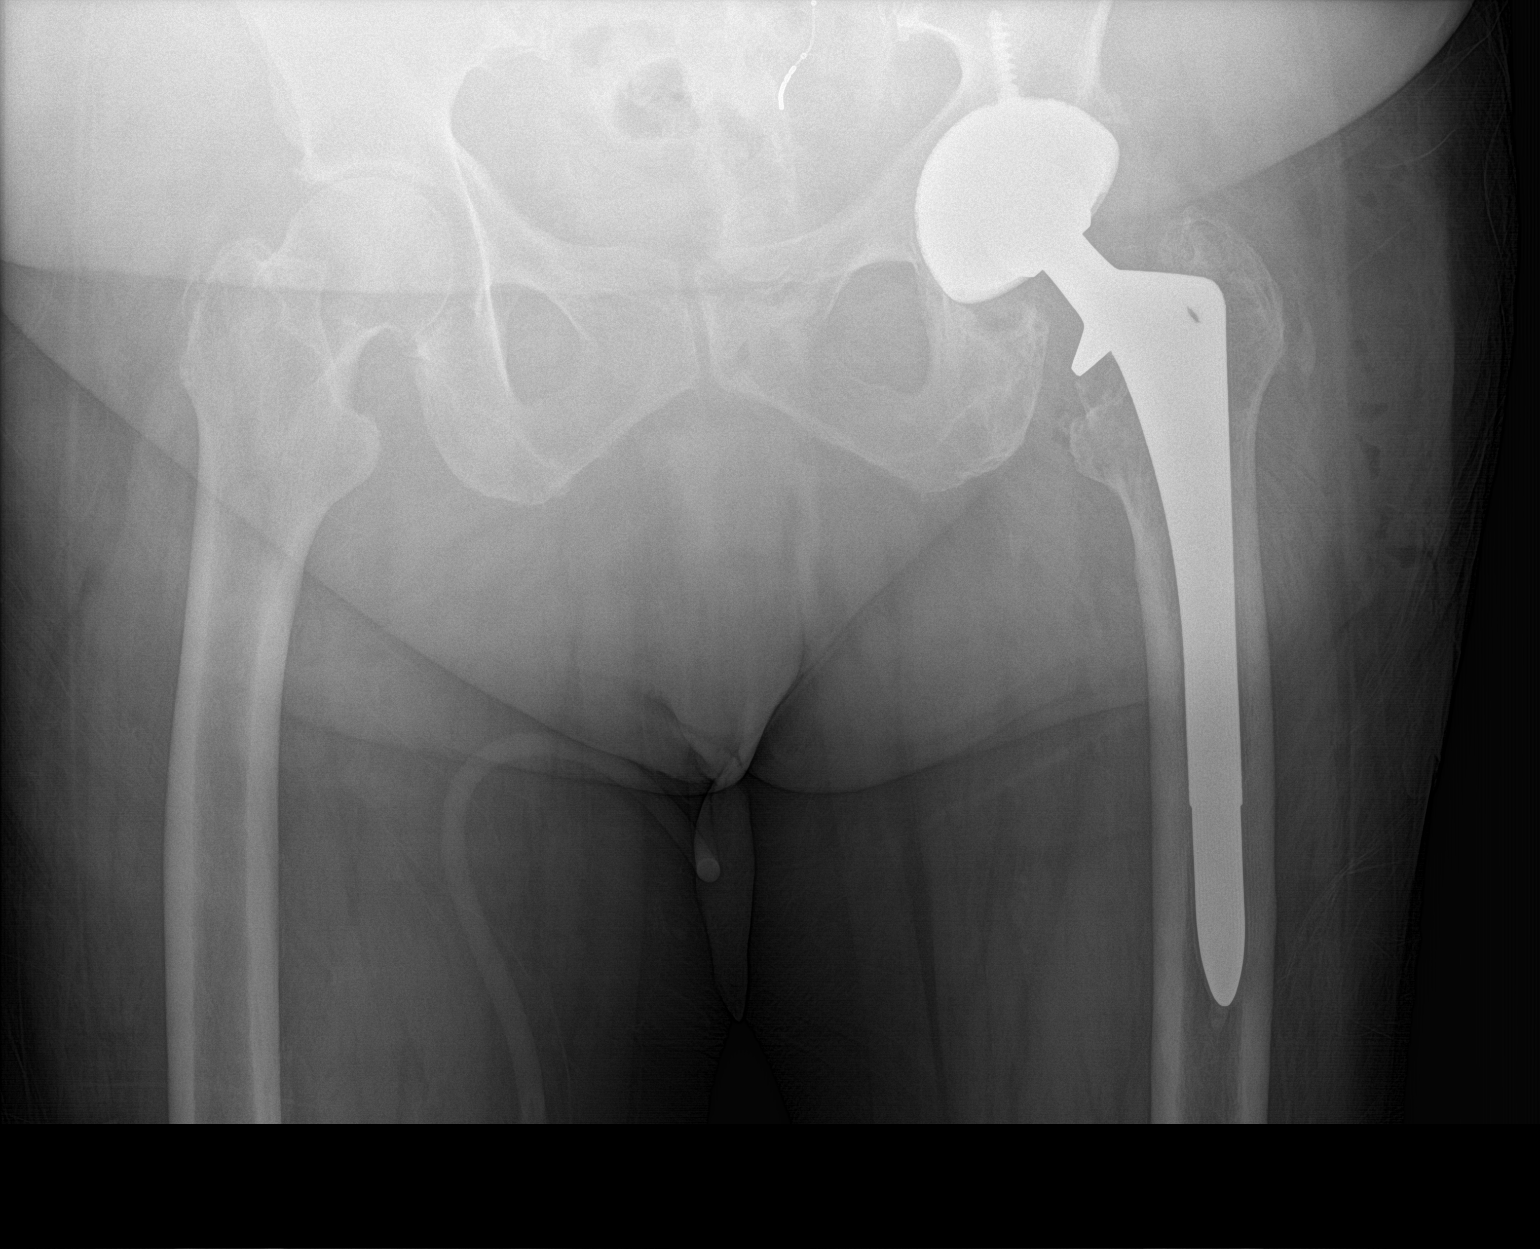

[1 of 1 positions shown; findings below may reference images not displayed]

FINDINGS: The patient has undergone left total hip joint prosthesis placement.
Radiographic positioning of the prosthetic components is good. The
interface with the native bone appears normal. Avulsion of a portion
of the greater trochanter is chronic.
IMPRESSION: No immediate postprocedure complication following revision of left
total hip joint prosthesis.

## 2019-09-28 ENCOUNTER — Other Ambulatory Visit: Payer: Self-pay

## 2019-09-28 ENCOUNTER — Other Ambulatory Visit: Payer: Self-pay | Admitting: Anesthesiology

## 2019-09-28 ENCOUNTER — Ambulatory Visit
Admission: RE | Admit: 2019-09-28 | Discharge: 2019-09-28 | Disposition: A | Discharge status: Home / Self Care | Payer: Medicare Other | Source: Ambulatory Visit | Attending: Anesthesiology | Admitting: Anesthesiology

## 2019-09-28 DIAGNOSIS — I6523 Occlusion and stenosis of bilateral carotid arteries: Secondary | ICD-10-CM

## 2019-09-28 DIAGNOSIS — R1031 Right lower quadrant pain: Secondary | ICD-10-CM

## 2019-09-28 DIAGNOSIS — M5410 Radiculopathy, site unspecified: Secondary | ICD-10-CM

## 2019-09-29 ENCOUNTER — Other Ambulatory Visit: Payer: Self-pay | Admitting: Cardiology

## 2019-09-29 LAB — CMP14+EGFR
ALT: 17 IU/L (ref 0–32)
AST: 15 IU/L (ref 0–40)
Albumin/Globulin Ratio: 1.5 (ref 1.2–2.2)
Albumin: 4.2 g/dL (ref 3.8–4.8)
Alkaline Phosphatase: 149 IU/L — ABNORMAL HIGH (ref 44–121)
BUN/Creatinine Ratio: 15 (ref 12–28)
BUN: 12 mg/dL (ref 8–27)
Bilirubin Total: <0.2 mg/dL (ref 0.0–1.2)
CO2: 23 mmol/L (ref 20–29)
Calcium: 9.3 mg/dL (ref 8.7–10.3)
Chloride: 102 mmol/L (ref 96–106)
Creatinine, Ser: 0.78 mg/dL (ref 0.57–1.00)
GFR calc Af Amer: 89 mL/min/{1.73_m2} (ref >59)
GFR calc non Af Amer: 77 mL/min/{1.73_m2} (ref >59)
Globulin, Total: 2.8 g/dL (ref 1.5–4.5)
Glucose: 123 mg/dL — ABNORMAL HIGH (ref 65–99)
Potassium: 4.4 mmol/L (ref 3.5–5.2)
Sodium: 140 mmol/L (ref 134–144)
Total Protein: 7 g/dL (ref 6.0–8.5)

## 2019-09-29 LAB — CBC
Hematocrit: 32.9 % — ABNORMAL LOW (ref 34.0–46.6)
Hemoglobin: 9.8 g/dL — ABNORMAL LOW (ref 11.1–15.9)
MCH: 21.6 pg — ABNORMAL LOW (ref 26.6–33.0)
MCHC: 29.8 g/dL — ABNORMAL LOW (ref 31.5–35.7)
MCV: 73 fL — ABNORMAL LOW (ref 79–97)
Platelets: 272 10*3/uL (ref 150–450)
RBC: 4.54 x10E6/uL (ref 3.77–5.28)
RDW: 17.2 % — ABNORMAL HIGH (ref 11.7–15.4)
WBC: 5.4 10*3/uL (ref 3.4–10.8)

## 2019-09-29 LAB — IRON,TIBC AND FERRITIN PANEL
Ferritin: 28 ng/mL (ref 15–150)
Iron Saturation: 5 % — CL (ref 15–55)
Iron: 22 ug/dL — ABNORMAL LOW (ref 27–139)
Total Iron Binding Capacity: 446 ug/dL (ref 250–450)
UIBC: 424 ug/dL — ABNORMAL HIGH (ref 118–369)

## 2019-09-29 LAB — LIPID PANEL WITH LDL/HDL RATIO
Cholesterol, Total: 148 mg/dL (ref 100–199)
HDL: 39 mg/dL — ABNORMAL LOW (ref >39)
LDL Chol Calc (NIH): 86 mg/dL (ref 0–99)
LDL/HDL Ratio: 2.2 ratio (ref 0.0–3.2)
Triglycerides: 130 mg/dL (ref 0–149)
VLDL Cholesterol Cal: 23 mg/dL (ref 5–40)

## 2019-09-29 NOTE — Progress Notes (Signed)
Patient is agreeable and will call or go to ER to have iron transfusion.

## 2019-09-29 NOTE — Progress Notes (Signed)
Let patient know that she is severely anemic with iron deficiency and needs to have iron transfusion. Otherwise kidney and liver function good and lipids normal.

## 2019-09-29 NOTE — Progress Notes (Signed)
She does not need to go to the ED. Please call and let her know to discuss with PCP. Anemia is mild and has severe iron deficiency and can have outpatient iron transfusion. THis would be better than oral medications. I also do not know why she is anemic and may need to be evaluated

## 2019-09-29 NOTE — Progress Notes (Signed)
Patient called and wants to know if she go somewhere else other than the ER? Please advise.

## 2019-09-30 NOTE — Progress Notes (Signed)
Patient is aware and will contact her PCP to make an appt.

## 2019-10-07 ENCOUNTER — Ambulatory Visit
Admission: RE | Admit: 2019-10-07 | Discharge: 2019-10-07 | Disposition: A | Discharge status: Home / Self Care | Payer: Medicare Other | Source: Ambulatory Visit | Attending: Anesthesiology | Admitting: Anesthesiology

## 2019-10-07 ENCOUNTER — Other Ambulatory Visit: Payer: Self-pay | Admitting: Anesthesiology

## 2019-10-07 DIAGNOSIS — R52 Pain, unspecified: Secondary | ICD-10-CM

## 2019-10-11 ENCOUNTER — Other Ambulatory Visit: Payer: Self-pay | Admitting: Physician Assistant

## 2019-10-11 ENCOUNTER — Telehealth: Payer: Self-pay

## 2019-10-11 NOTE — Telephone Encounter (Signed)
Patient called in stating that her cardiologist is concerned with her anemia and low iron. Patient states they want her to have an iron infusion and it should be set up with her PCP. Patient states that she has called her PCP and has not been able to get in contact with her PCP. Patient states that she had been seen by Dr Alen Blew in the past for the same problem. Advised patient I would send a message to Dr Alen Blew with her concerns and someone would be in contact with her. Patient verbalized understanding.

## 2019-10-13 IMAGING — DX DG CHEST 2V
2 series · 2 of 2 positions shown · non-contrast
Comparison: Chest radiograph 10/06/2017, chest CT 07/31/2011

CLINICAL DATA: Centralized chest pain and pressure.

EXAM:
CHEST - 2 VIEW

[w chest pa]
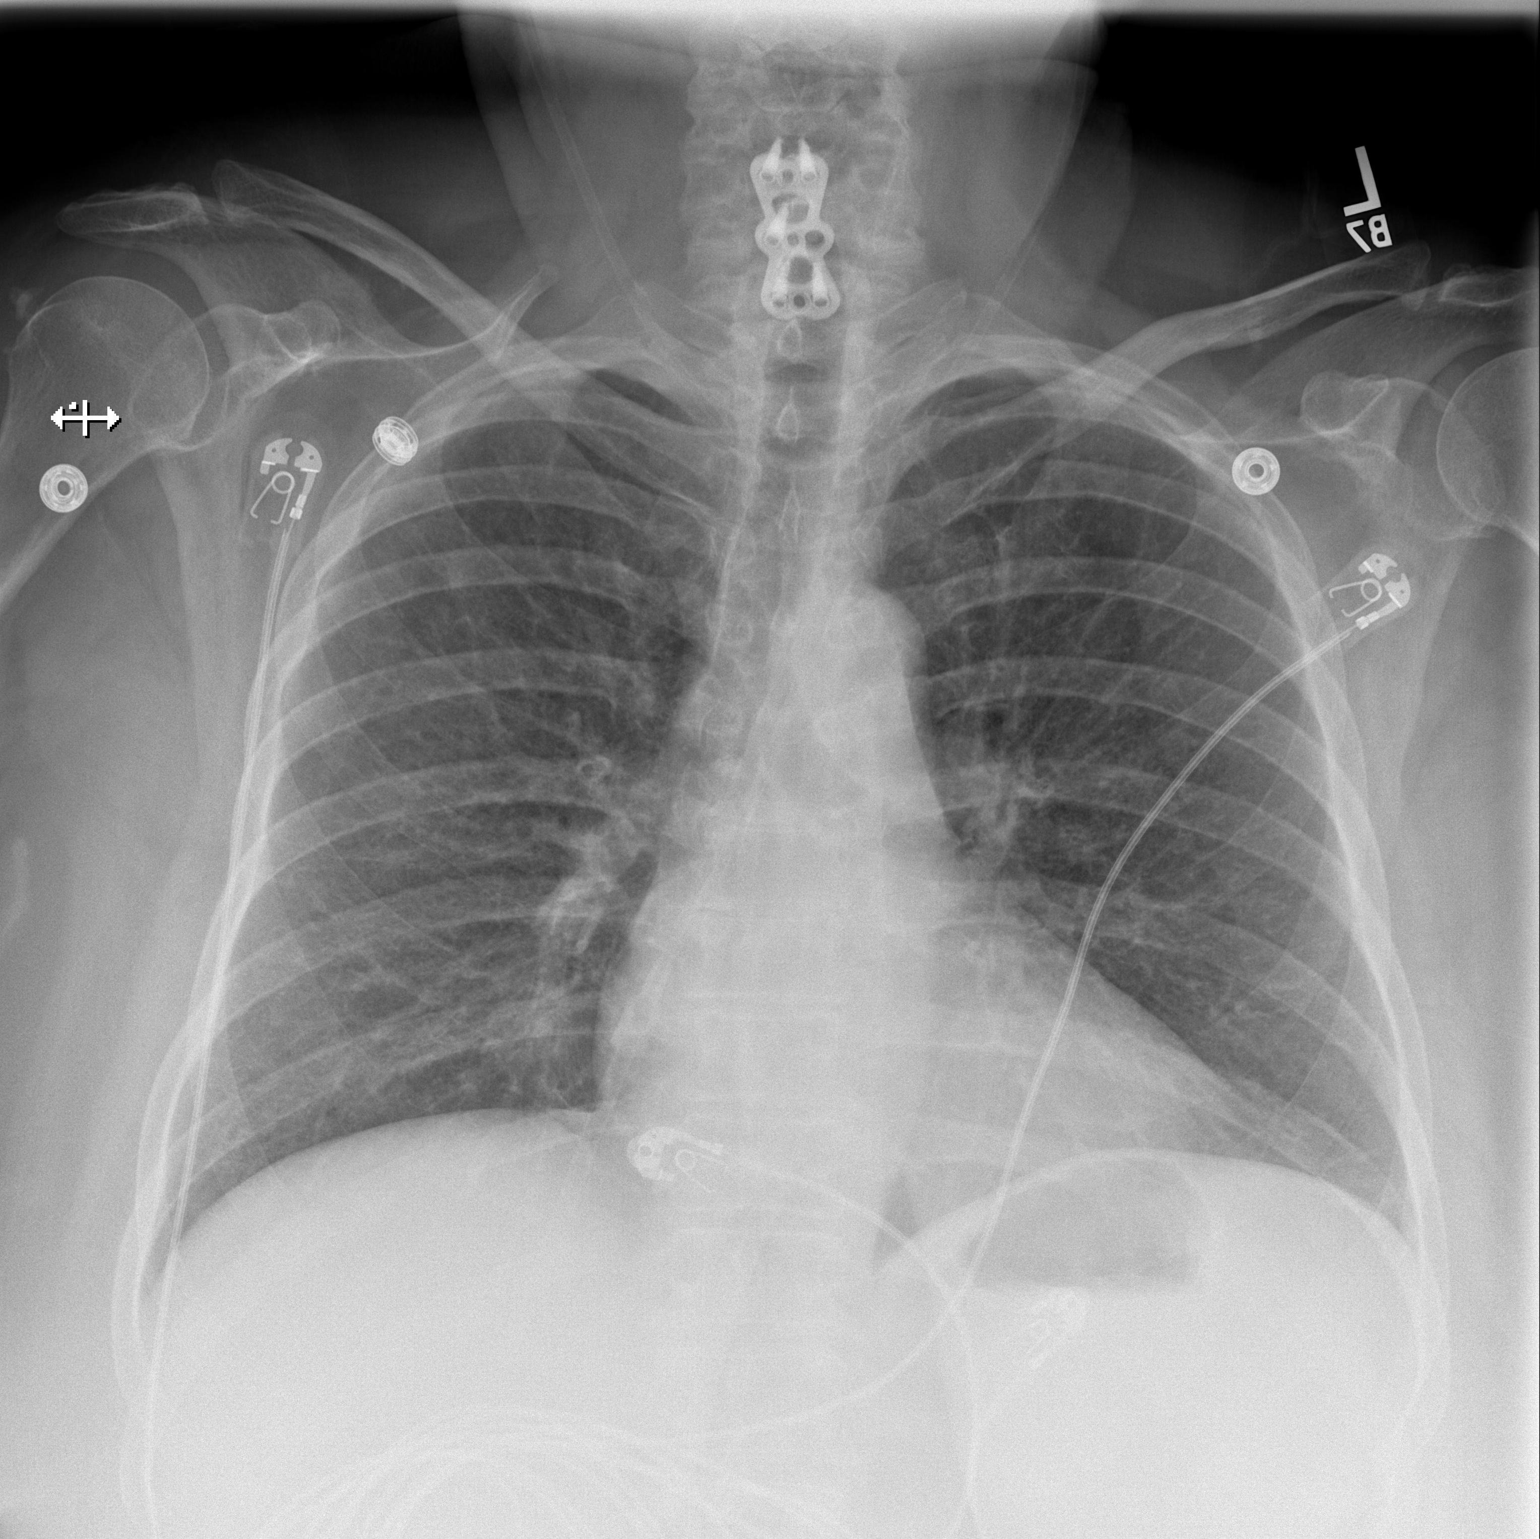

[w chest lat]
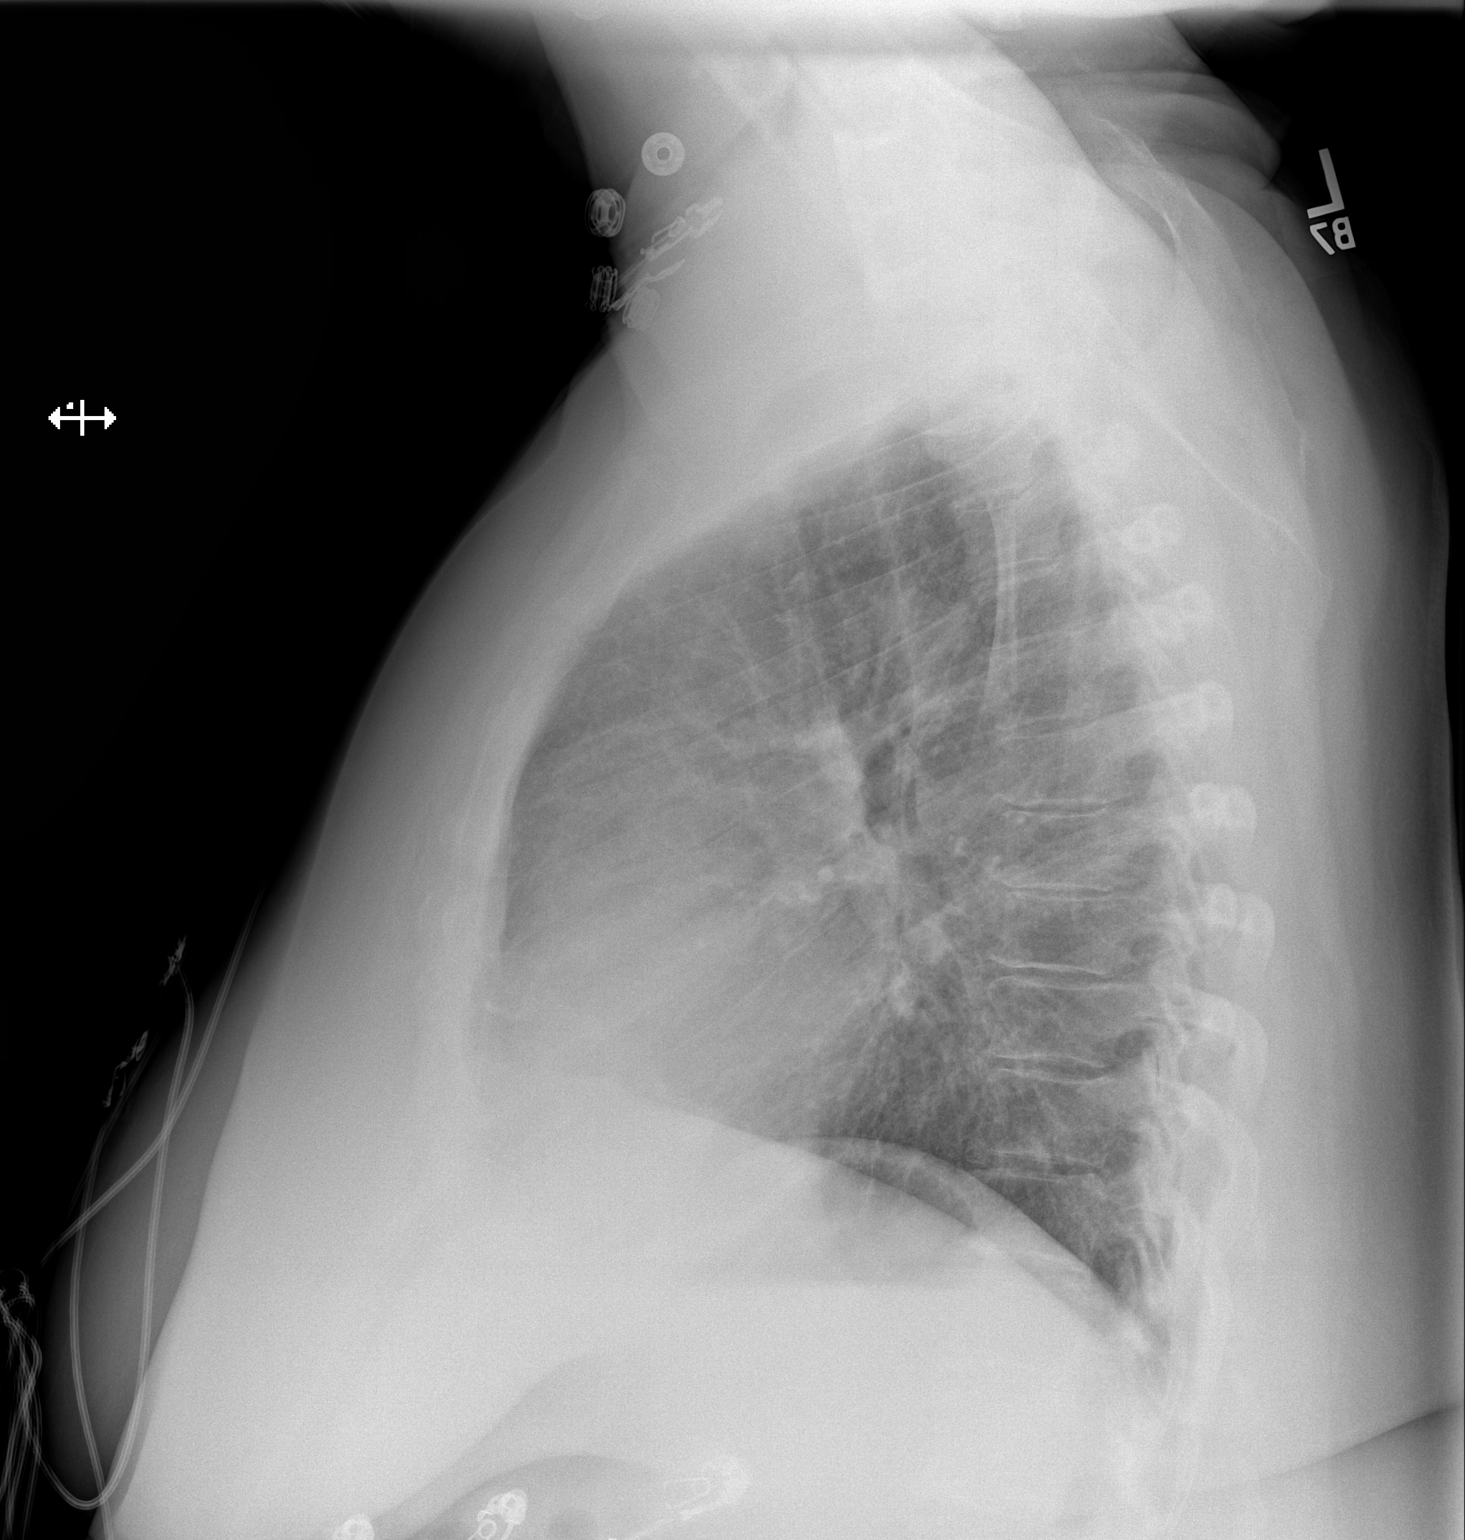

[2 of 2 positions shown; findings below may reference images not displayed]

FINDINGS: Cardiomediastinal silhouette is normal. Mediastinal contours appear
intact.

There is no evidence of focal airspace consolidation, pleural
effusion or pneumothorax. Possible few mm pulmonary nodule in the
right upper lobe seen on the frontal view.

Osseous structures are without acute abnormality. Soft tissues are
grossly normal. Stable appearance of cervical spinal fusion.
IMPRESSION: No evidence of lobar consolidation or pulmonary edema.

Possible few mm pulmonary nodule in the right upper lobe.

## 2019-10-13 IMAGING — CT CT ANGIO CHEST
2 of 6 series · 18 of 36 positions shown · IV contrast (iopamidol)
Comparison: CTA Chest 07/31/2011

CLINICAL DATA: Shortness of breath

EXAM:
CT ANGIOGRAPHY CHEST WITH CONTRAST
TECHNIQUE: Multidetector CT imaging of the chest was performed using the
standard protocol during bolus administration of intravenous
contrast. Multiplanar CT image reconstructions and MIPs were
obtained to evaluate the vascular anatomy.
CONTRAST:  100mL 2TPD4N-QHT IOPAMIDOL (2TPD4N-QHT) INJECTION 76%

[Series 7: pe thins · axial · 0.83mm/px · z∈[-163,+60]mm · 17 of 354 slices shown]
[im 18/354  lung]
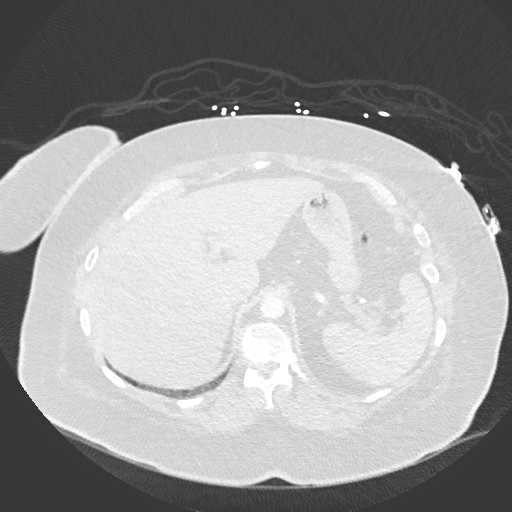
[im 36/354  mediastinal]
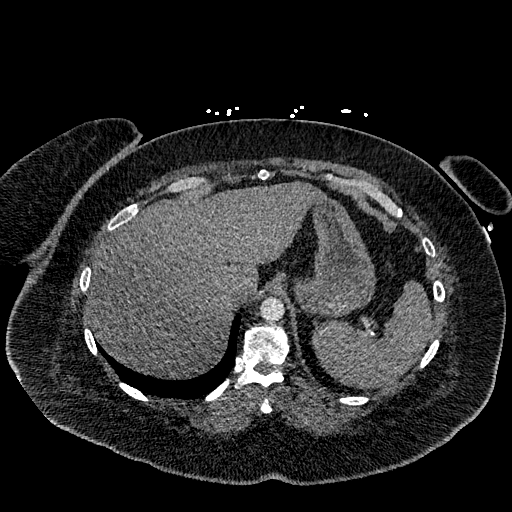
[im 53/354  lung]
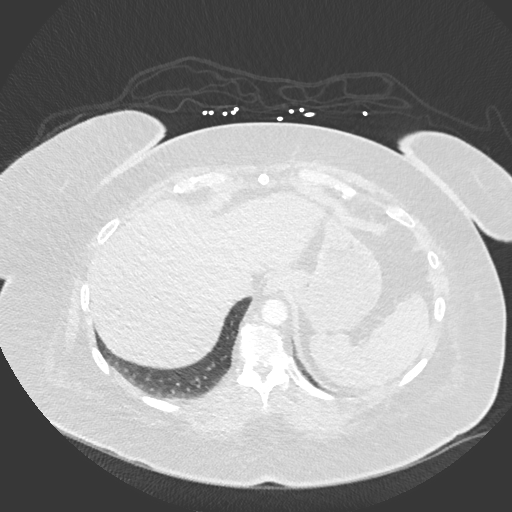
[im 71/354  mediastinal]
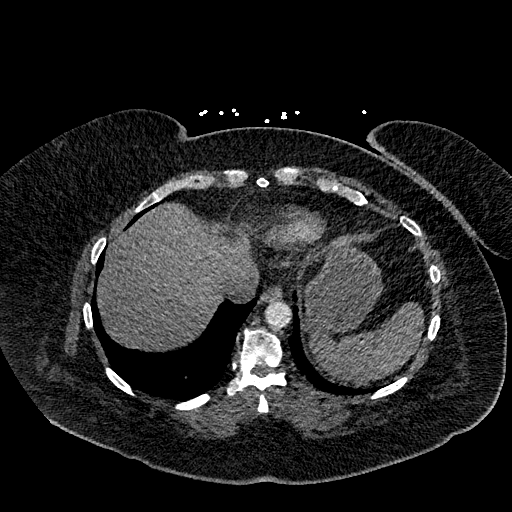
[im 106/354  lung]
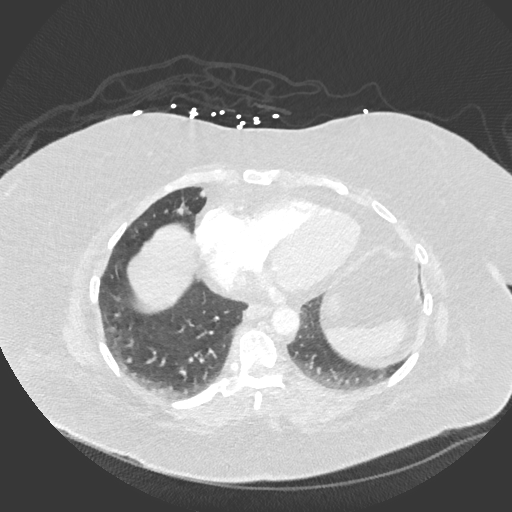
[im 124/354  mediastinal]
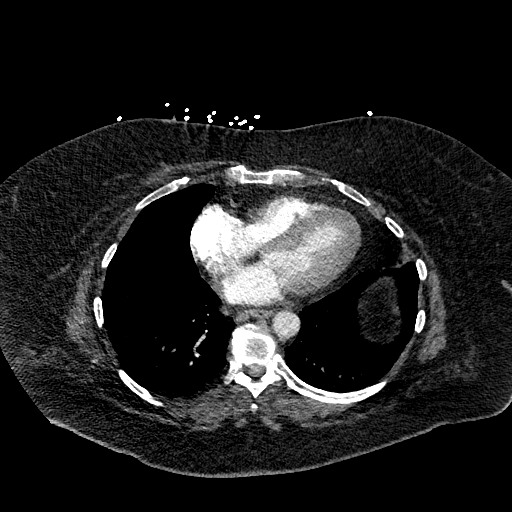
[im 142/354  lung]
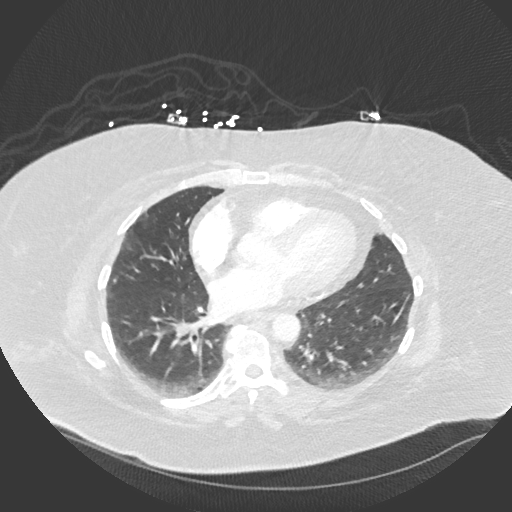
[im 159/354  mediastinal]
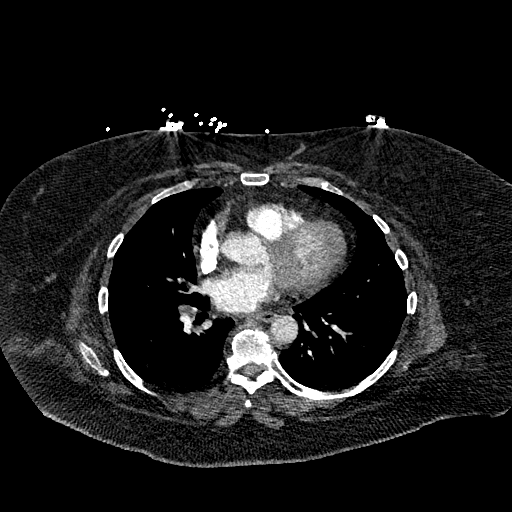
[im 177/354  lung]
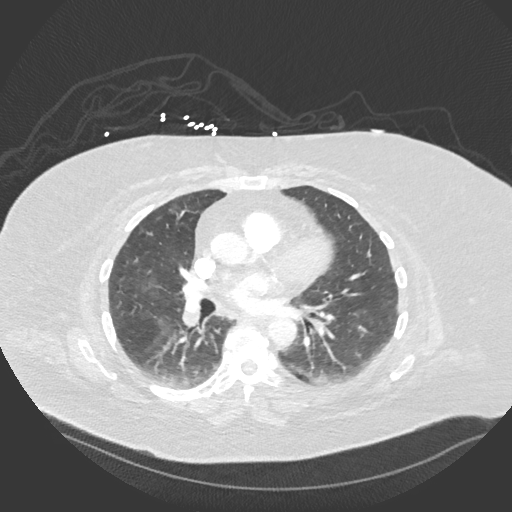
[im 195/354  mediastinal]
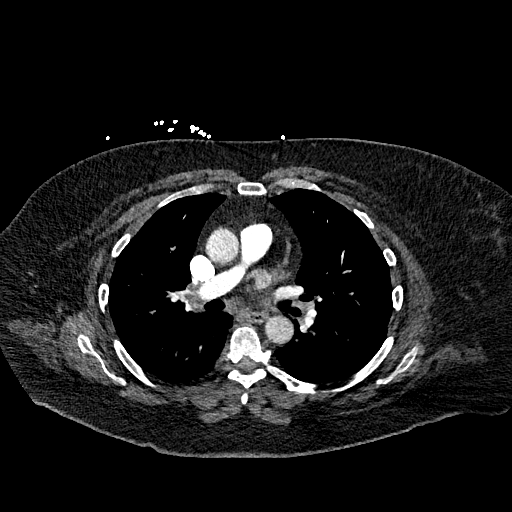
[im 212/354  lung]
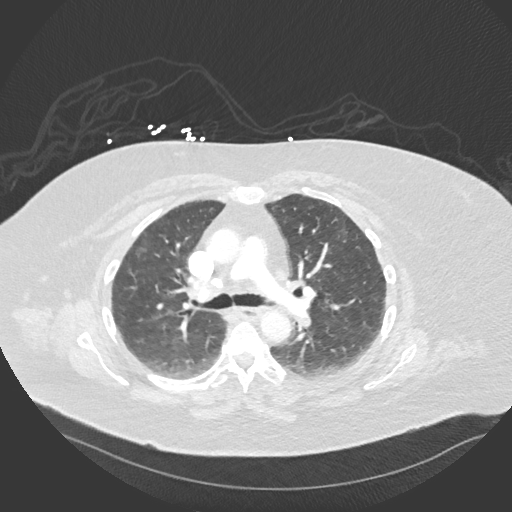
[im 230/354  mediastinal]
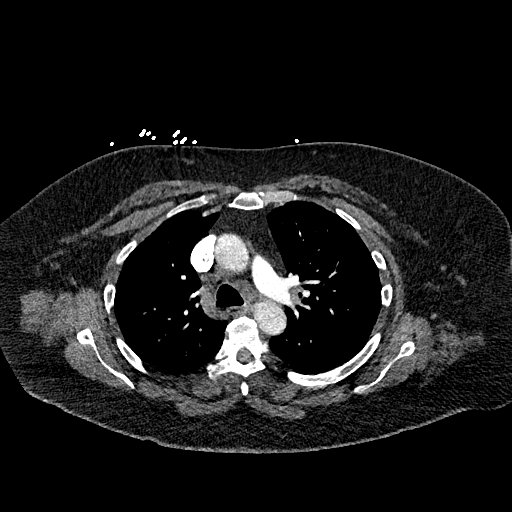
[im 248/354  lung]
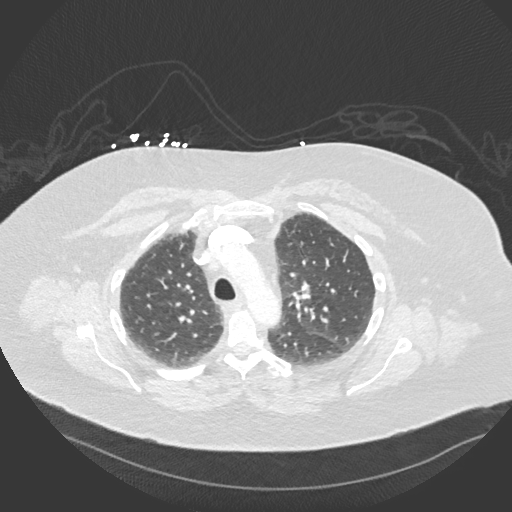
[im 283/354  mediastinal]
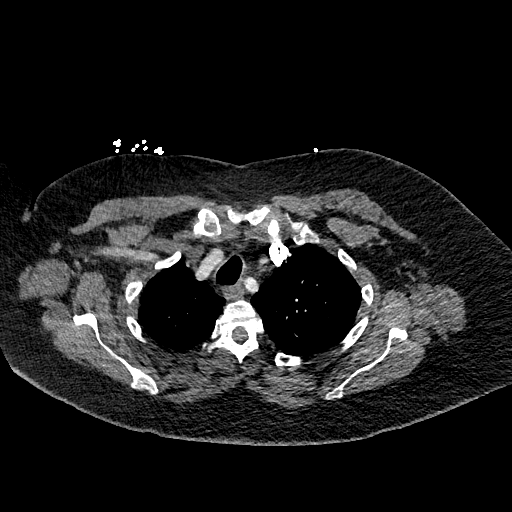
[im 301/354  lung]
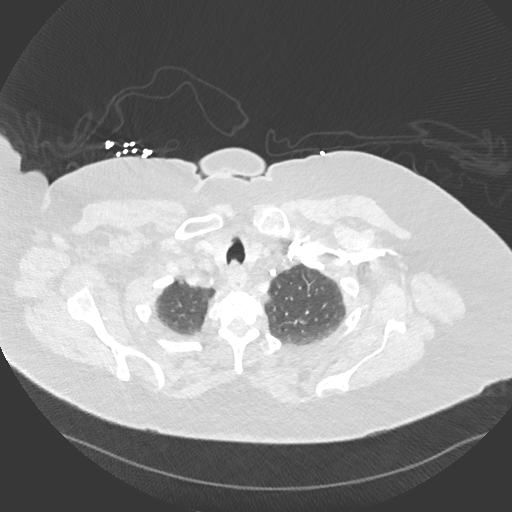
[im 318/354  mediastinal]
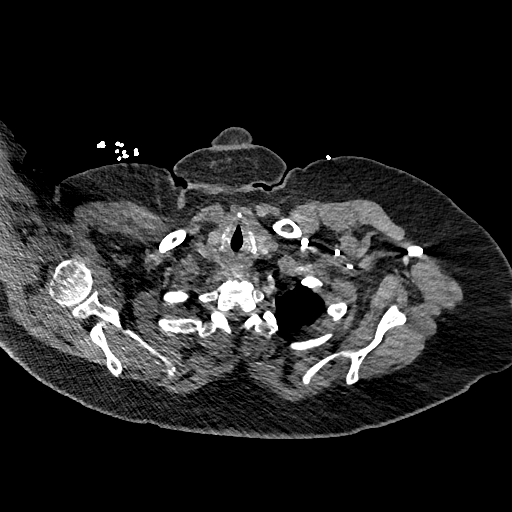
[im 336/354  lung]
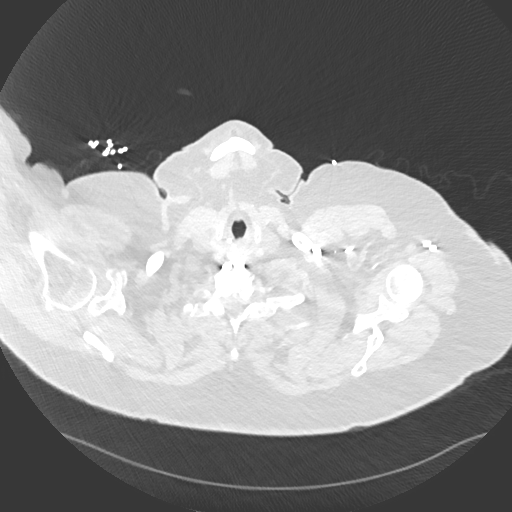

[Series 8: pe 2mm cor · coronal · 0.50mm/px · 1 of 158 slices shown]
[im 79/158  mediastinal]
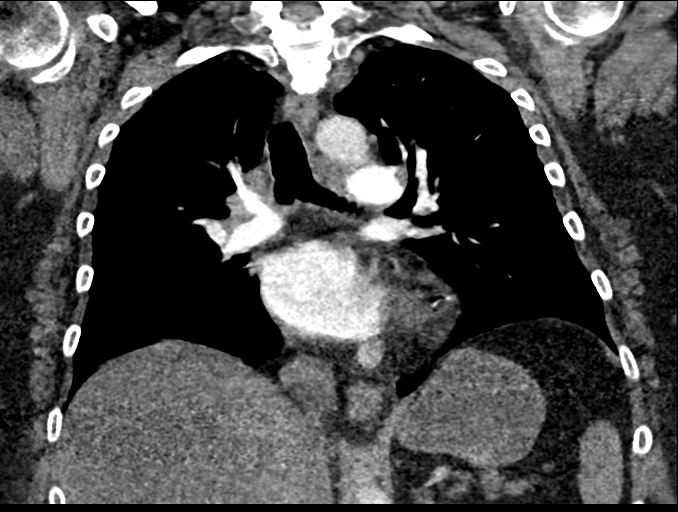

[18 of 36 positions shown; findings below may reference images not displayed]

FINDINGS: Cardiovascular:

--Pulmonary arteries: Contrast injection is sufficient to
demonstrate satisfactory opacification of the pulmonary arteries to
the segmental level. There is no pulmonary embolus. The main
pulmonary artery is within normal limits for size.

--Aorta: Limited opacification of the aorta due to bolus timing
optimization for the pulmonary arteries. Conventional 3 vessel
aortic branching pattern. The aortic course and caliber are normal.
There is minimal aortic atherosclerosis.

--Heart: Normal size. No pericardial effusion. There are coronary
artery calcifications

Mediastinum/Nodes: No mediastinal, hilar or axillary
lymphadenopathy. The visualized thyroid and thoracic esophageal
course are unremarkable.

Lungs/Pleura: Unchanged appearance of scattered pulmonary nodules
there are likely the sequela of prior granulomatous infection.

Upper Abdomen: Contrast bolus timing is not optimized for evaluation
of the abdominal organs. Within this limitation, the visualized
organs of the upper abdomen are normal.

Musculoskeletal: No chest wall abnormality. No acute or significant
osseous findings.

Review of the MIP images confirms the above findings.
IMPRESSION: 1. No pulmonary embolus or other acute thoracic abnormality.
2. Coronary artery and aortic atherosclerosis ([id number]).
3. Unchanged appearance of scattered, tiny pulmonary nodules likely
related to prior granulomatous infection.

## 2019-10-21 ENCOUNTER — Telehealth: Payer: Self-pay | Admitting: Internal Medicine

## 2019-10-21 NOTE — Telephone Encounter (Signed)
Pt states she is still having abd discomfort. She states she wanted to know results from her [facility]. Pt scheduled to see [name] 10/26/19@10 :30am. Pt aware of appt.

## 2019-10-21 NOTE — Telephone Encounter (Signed)
Patient requesting results from hospital also seeking advise on abdominal pain she is having

## 2019-10-26 ENCOUNTER — Ambulatory Visit: Payer: Medicare Other | Admitting: Physician Assistant

## 2019-10-26 ENCOUNTER — Encounter: Payer: Self-pay | Admitting: Physician Assistant

## 2019-10-26 VITALS — BP 130/78 | HR 85 | Ht 61.0 in | Wt 192.0 lb

## 2019-10-26 DIAGNOSIS — D509 Iron deficiency anemia, unspecified: Secondary | ICD-10-CM

## 2019-10-26 DIAGNOSIS — R1314 Dysphagia, pharyngoesophageal phase: Secondary | ICD-10-CM

## 2019-10-26 DIAGNOSIS — K3184 Gastroparesis: Secondary | ICD-10-CM | POA: Diagnosis not present

## 2019-10-26 MED ORDER — METOCLOPRAMIDE HCL 5 MG PO TABS: 5.0000 mg | ORAL_TABLET | Freq: Three times a day (TID) | ORAL | 2 refills | Status: DC

## 2019-10-26 NOTE — Patient Instructions (Addendum)
If you are age 70 or older, your body mass index should be between 23-30. Your Body mass index is 36.28 kg/m. If this is out of the aforementioned range listed, please consider follow up with your Primary Care Provider.  If you are age 94 or younger, your body mass index should be between 19-25. Your Body mass index is 36.28 kg/m. If this is out of the aformentioned range listed, please consider follow up with your Primary Care Provider.   You have been scheduled for a Barium Esophogram at [facility] (1st floor of the hospital) on Monday, 11-15-19 at 9:30am. Please arrive 15 minutes prior to your appointment for registration. Make certain not to have anything to eat or drink 3 hours prior to your test. If you need to reschedule for any reason, please contact radiology at [phone] to do so. ______________________________________________________________  A barium swallow is an examination that concentrates on views of the esophagus. This tends to be a double contrast exam (barium and two liquids which, when combined, create a gas to distend the wall of the oesophagus) or single contrast (non-ionic iodine based). The study is usually tailored to your symptoms so a good history is essential. Attention is paid during the study to the form, structure and configuration of the esophagus, looking for functional disorders (such as aspiration, dysphagia, achalasia, motility and reflux) EXAMINATION You may be asked to change into a gown, depending on the type of swallow being performed. A radiologist and radiographer will perform the procedure. The radiologist will advise you of the type of contrast selected for your procedure and direct you during the exam. You will be asked to stand, sit or lie in several different positions and to hold a small amount of fluid in your mouth before being asked to swallow while the imaging is performed .In some instances you may be asked to swallow barium coated  marshmallows to assess the motility of a solid food bolus. The exam can be recorded as a digital or video fluoroscopy procedure. POST PROCEDURE It will take 1-2 days for the barium to pass through your system. To facilitate this, it is important, unless otherwise directed, to increase your fluids for the next 24-48hrs and to resume your normal diet.  This test typically takes about 30 minutes to perform. _____________________________________________________________  We have sent the following medications to your pharmacy for you to pick up at your convenience: Reglan 5 mg: Take three times a day, 20 to 30 minutes before meals   Please follow up in 2 months.   Thank you for choosing [facility] and for entrusting me with your care, [name], PA-C

## 2019-10-26 NOTE — Progress Notes (Signed)
Assessment noted

## 2019-10-26 NOTE — Progress Notes (Signed)
Chief Complaint: Follow-up GERD  HPI:    [name] is a 69 year old female with a past medical history as listed below including chronic anticoagulation with Plavix and aspirin, known to Dr. Henrene Pastor, who returns to clinic today for follow-up of her reflux and review of recent testing.     10/22/2016 EGD with LA grade a reflux esophagitis.  Colonoscopy on the same day with 2 3-4 mm polyps in the transverse and ascending colon, internal hemorrhoids and diverticulosis in the sigmoid colon.  Pathology with tubular adenomas.  Repeat recommended in 5 years.    07/14/2019 patient seen in clinic for indigestion.  At that time ordered a gastric emptying study for further evaluation of possible gastroparesis and stopped omeprazole and started pantoprazole 40 mg twice daily.  EGD reviewed last in 2018 with reflux esophagitis.    07/29/2019 gastric emptying study showed delayed emptying at 4 hours.    08/23/2019 EKG with normal sinus rhythm.    09/28/2019 CBC with a hemoglobin of 9.8, iron saturation low at 5%, iron low at 22.  Patient's PCP ordered iron infusions and patient has started oral iron.    Today, the patient presents to clinic and tells me that she followed with the cancer center in regards to her anemia in the past and has been trying to make an appointment with them since "it has come back".  In the meantime she is started her oral iron tablets again.  She did not go to iron infusion as instructed by them.  Describes that her GI symptoms are the same as when she was seen last in clinic in June with indigestion and burping and feels some epigastric pain.  New to her is that she is also feeling like she gets choked on food at times.    Denies fever, chills, blood in her stool or weight loss.  Past Medical History:  Diagnosis Date  . Anxiety   . Aortic atherosclerosis ([place])   . Bilateral cataracts   . Carpal tunnel syndrome   . Cervical cancer ([place])   . Chronic back pain   . Claudication ([place])     . COPD (chronic obstructive pulmonary disease) ([facility])    wheezing  . DDD (degenerative disc disease), cervical   . DDD (degenerative disc disease), lumbar   . Depression   . Diverticulosis   . Gastroparesis   . GERD (gastroesophageal reflux disease)   . Hearing loss    Bilateral  . History of blood transfusion   . History of colon polyps   . History of migraine   . Hypertension   . Internal hemorrhoids   . Iron deficiency anemia   . Left-sided weakness   . Leg swelling   . Liver disease    pt unaware  . Lung nodule    several small  . OA (osteoarthritis)    both hands  . Positive ANA (antinuclear antibody)   . Pre-diabetes   . Raynaud's phenomenon   . Restless leg   . Seizures ([place])    no meds for 8 months  . Sleep apnea    does not use her cpap  . Spondylosis   . Stroke [facility]) 2015   no weakness  . 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   rt  . Urge incontinence of urine   . Varicose vein of leg   . Venous insufficiency     Past Surgical History:  Procedure Laterality Date  . BACK SURGERY    . BLEPHAROPLASTY  10/10/2017  . CATARACT EXTRACTION, BILATERAL    . CESAREAN SECTION    . CHOLECYSTECTOMY    . COLONOSCOPY  10/2016  . HAND SURGERY Bilateral    carpal tunnel  . HEMORRHOID SURGERY    . INTERSTIM IMPLANT PLACEMENT    . KNEE SURGERY Left    arthroscopy x3  . LOWER EXTREMITY ANGIOGRAPHY N/A 07/16/2016   Procedure: Lower Extremity Angiography;  Surgeon: Adrian Prows, MD;  Location: [facility];  Service: Cardiovascular;  Laterality: N/A;  . LOWER EXTREMITY INTERVENTION N/A 08/06/2016   Procedure: Lower Extremity Intervention;  Surgeon: Adrian Prows, MD;  Location: [facility];  Service: Cardiovascular;  Laterality: N/A;  . NECK SURGERY    . OTHER SURGICAL HISTORY  2013   bladder stimulator in back  . PERIPHERAL VASCULAR BALLOON ANGIOPLASTY  08/06/2016   Procedure: Peripheral Vascular Balloon Angioplasty;  Surgeon: Adrian Prows, MD;  Location: [facility];  Service: Cardiovascular;;  Right SFA  . TENNIS ELBOW RELEASE/NIRSCHEL PROCEDURE Right 12/2015  . TOE SURGERY Right    right foot second and fourth toe  . TOTAL HIP ARTHROPLASTY Left   . TOTAL HIP REVISION Left 10/13/2017   Procedure: LEFT TOTAL HIP REVISION ACETABULUM, OPEN REDUCTION INTERNAL WITH BONE GRAFT FIXATION LEFT GREATER TROCHANTERIC FRACTURE;  Surgeon: Frederik Pear, MD;  Location: [facility];  Service: Orthopedics;  Laterality: Left;  . TOTAL KNEE ARTHROPLASTY Right 01/12/2016   Procedure: RIGHT TOTAL KNEE ARTHROPLASTY;  Surgeon: Netta Cedars, MD;  Location: [facility];  Service: Orthopedics;  Laterality: Right;  . TUBAL LIGATION    . UPPER GI ENDOSCOPY  10/2016  . VAGINAL HYSTERECTOMY      Current Outpatient Medications  Medication Sig Dispense Refill  . albuterol (PROVENTIL HFA;VENTOLIN HFA) 108 (90 Base) MCG/ACT inhaler Inhale 2 puffs into the lungs every 4 (four) hours as needed for wheezing or shortness of breath (cough). 1 Inhaler 1  . amLODipine (NORVASC) 5 MG tablet TAKE 1 TABLET BY MOUTH EVERY DAY 90 tablet 1  . aspirin EC 81 MG tablet Take 1 tablet (81 mg total) by mouth 2 (two) times daily. (Patient taking differently: Take 81 mg by mouth daily. ) 60 tablet 0  . atorvastatin (LIPITOR) 20 MG tablet Take 20 mg by mouth daily.    . busPIRone (BUSPAR) 5 MG tablet Take 1 tablet (5 mg total) by mouth 2 (two) times daily. 60 tablet 0  . DULoxetine (CYMBALTA) 60 MG capsule Take 60 mg by mouth daily after breakfast.     . gemfibrozil (LOPID) 600 MG tablet Take 600 mg by mouth 2 (two) times daily before a meal.    . losartan-hydrochlorothiazide (HYZAAR) 50-12.5 MG tablet TAKE 1 TABLET BY MOUTH EVERY DAY IN THE MORNING 90 tablet 3  . methocarbamol (ROBAXIN) 500 MG tablet Take 1 tablet (500 mg total) by mouth every 6 (six) hours as needed for muscle spasms. (Patient taking differently: Take 500 mg by mouth See admin instructions. Take one tablet (500 mg) by mouth daily at bedtime, may  also take one tablet (500 mg) during the day as needed for muscle spasms) 60 tablet 0  . nitroGLYCERIN (NITROSTAT) 0.4 MG SL tablet Place 0.4 mg under the tongue every 5 (five) minutes x 3 doses as needed for chest pain.   1  . oxyCODONE-acetaminophen (PERCOCET) 7.5-325 MG tablet Take 1 tablet by mouth every 4 (four) hours as needed for severe pain. 30 tablet 0  . pantoprazole (PROTONIX) 40 MG tablet  TAKE 1 TABLET (40 MG TOTAL) BY MOUTH 2 (TWO) TIMES DAILY BEFORE A MEAL. 180 tablet 1  . rOPINIRole (REQUIP) 1 MG tablet Take 1 mg by mouth 3 (three) times daily.     . traZODone (DESYREL) 50 MG tablet TAKE 0.5-1 TABLETS (25-50 MG TOTAL) BY MOUTH AT BEDTIME AS NEEDED FOR SLEEP. 90 tablet 1  . OVER THE COUNTER MEDICATION Apply 1 application topically as needed (joint pain). Magnilife topicall muscle rub (Patient not taking: Reported on 10/26/2019)     No current facility-administered medications for this visit.    Allergies as of 10/26/2019 - Review Complete 10/26/2019  Allergen Reaction Noted  . Bee venom Anaphylaxis 12/28/2015  . Gabapentin Other (See Comments) 10/14/2017  . Morphine and related Other (See Comments) 07/12/2016  . Codeine Hives 02/20/2017  . Adhesive [tape] Other (See Comments) 08/02/2016  . Sulfa antibiotics Other (See Comments) 09/02/2014  . Sulfonamide derivatives Other (See Comments) 02/20/2017    Family History  Problem Relation Age of Onset  . Hyperlipidemia Mother   . Hypertension Mother   . Anxiety disorder Mother   . Depression Mother   . Hyperlipidemia Father   . Hypertension Father   . Alzheimer's disease Father   . Anxiety disorder Brother   . Cancer Daughter        unknown  . Anxiety disorder Brother   . Anxiety disorder Brother   . Drug abuse Brother   . Stroke Brother   . Colon cancer Neg Hx   . Esophageal cancer Neg Hx   . Rectal cancer Neg Hx   . Stomach cancer Neg Hx     Social History   Socioeconomic History  . Marital status: Married     Spouse name: sammy  . Number of children: 1  . Years of education: Not on file  . Highest education level: Associate degree: occupational, Hotel manager, or vocational program  Occupational History  . Not on file  Tobacco Use  . Smoking status: Current Every Day Smoker    Packs/day: 1.00    Years: 55.00    Pack years: 55.00    Types: Cigarettes  . Smokeless tobacco: Never Used  Vaping Use  . Vaping Use: Never used  Substance and Sexual Activity  . Alcohol use: No    Alcohol/week: 0.0 standard drinks  . Drug use: Not Currently  . Sexual activity: Not Currently  Other Topics Concern  . Not on file  Social History Narrative  . Not on file   Social Determinants of Health   Financial Resource Strain:   . Difficulty of Paying Living Expenses: Not on file  Food Insecurity:   . Worried About Charity fundraiser in the Last Year: Not on file  . Ran Out of Food in the Last Year: Not on file  Transportation Needs:   . Lack of Transportation (Medical): Not on file  . Lack of Transportation (Non-Medical): Not on file  Physical Activity:   . Days of Exercise per Week: Not on file  . Minutes of Exercise per Session: Not on file  Stress:   . Feeling of Stress : Not on file  Social Connections:   . Frequency of Communication with Friends and Family: Not on file  . Frequency of Social Gatherings with Friends and Family: Not on file  . Attends Religious Services: Not on file  . Active Member of Clubs or Organizations: Not on file  . Attends Archivist Meetings: Not on file  .  Marital Status: Not on file  Intimate Partner Violence:   . Fear of Current or Ex-Partner: Not on file  . Emotionally Abused: Not on file  . Physically Abused: Not on file  . Sexually Abused: Not on file    Review of Systems:    Constitutional: No weight loss, fever, chills, weakness or fatigue HEENT: Eyes: No change in vision               Ears, Nose, Throat:  No change in hearing or  congestion Skin: No rash or itching Cardiovascular: No chest pain, chest pressure or palpitations   Respiratory: No SOB or cough Gastrointestinal: See HPI and otherwise negative Genitourinary: No dysuria or change in urinary frequency Neurological: No headache, dizziness or syncope Musculoskeletal: No new muscle or joint pain Hematologic: No bleeding or bruising Psychiatric: No history of depression or anxiety    Physical Exam:  Vital signs: Ht 5\' 1"  (1.549 m)   BMI 37.03 kg/m   Constitutional:   Pleasant Caucasian female appears to be in NAD, Well developed, Well nourished, alert and cooperative Head:  Normocephalic and atraumatic. Eyes:   PEERL, EOMI. No icterus. Conjunctiva pink. Ears:  Normal auditory acuity. Neck:  Supple Throat: Oral cavity and pharynx without inflammation, swelling or lesion.  Respiratory: Respirations even and unlabored. Lungs clear to auscultation bilaterally.   No wheezes, crackles, or rhonchi.  Cardiovascular: Normal S1, S2. No MRG. Regular rate and rhythm. No peripheral edema, cyanosis or pallor.  Gastrointestinal:  Soft, nondistended, nontender. No rebound or guarding. Normal bowel sounds. No appreciable masses or hepatomegaly. Rectal:  Not performed.  Msk:  Symmetrical without gross deformities. Without edema, no deformity or joint abnormality.  Neurologic:  Alert and  oriented x4;  grossly normal neurologically.  Skin:   Dry and intact without significant lesions or rashes. Psychiatric: Oriented to person, place and time. Demonstrates good judgement and reason without abnormal affect or behaviors.  RELEVANT LABS AND IMAGING: CBC    Component Value Date/Time   WBC 5.4 09/28/2019 0948   WBC 6.3 07/30/2018 1313   WBC 5.5 10/31/2017 1600   RBC 4.54 09/28/2019 0948   RBC 4.93 07/30/2018 1313   HGB 9.8 (L) 09/28/2019 0948   HCT 32.9 (L) 09/28/2019 0948   PLT 272 09/28/2019 0948   MCV 73 (L) 09/28/2019 0948   MCH 21.6 (L) 09/28/2019 0948   MCH  27.8 07/30/2018 1313   MCHC 29.8 (L) 09/28/2019 0948   MCHC 32.5 07/30/2018 1313   RDW 17.2 (H) 09/28/2019 0948   LYMPHSABS 1.8 07/30/2018 1313   MONOABS 0.6 07/30/2018 1313   EOSABS 0.3 07/30/2018 1313   BASOSABS 0.0 07/30/2018 1313    CMP     Component Value Date/Time   NA 140 09/28/2019 0948   K 4.4 09/28/2019 0948   K 3.5 03/19/2011 0722   CL 102 09/28/2019 0948   CO2 23 09/28/2019 0948   GLUCOSE 123 (H) 09/28/2019 0948   GLUCOSE 99 10/31/2017 1600   BUN 12 09/28/2019 0948   CREATININE 0.78 09/28/2019 0948   CALCIUM 9.3 09/28/2019 0948   PROT 7.0 09/28/2019 0948   ALBUMIN 4.2 09/28/2019 0948   AST 15 09/28/2019 0948   ALT 17 09/28/2019 0948   ALKPHOS 149 (H) 09/28/2019 0948   BILITOT <0.2 09/28/2019 0948   GFRNONAA 77 09/28/2019 0948   GFRAA 89 09/28/2019 0948    Assessment: 1.  Gastroparesis: Recent gastric emptying study with delayed emptying, patient does have epigastric pain and eructations  which are likely related to this 2.  Epigastric pain/eructations: With above 3.  IDA: Previously worked up by our clinic in 2018, now appears anemic again  Plan: 1.  Started the patient on 5 mg of Reglan 3 times daily, 20 minutes before meals #90 with 1 refill.  Goal of treating for 12 weeks and seeing how she does.  Did discuss side effects which can occur and told her to stop medication if she notices any of these. 2.  Did briefly discuss iron deficiency anemia.  She has had a full work-up for this in the past from our clinic in 2018.  Told her to let us know if her physicians are recommending any repeat GI work-up.  Otherwise she should continue to follow-up with her PCP in regards to iron infusions etc. 3.  Scheduled patient for a barium swallow with tablet due to new discussion of dysphagia. 4.  Recommend the patient avoid raw fiber and eat more small meals throughout the day. 5.  Patient to follow in clinic with me in 2 months or sooner if necessary.  [name],  PA-C [facility] 10/26/2019, 10:35 AM  Cc: Alvester Chou, NP

## 2019-11-15 ENCOUNTER — Other Ambulatory Visit: Payer: Self-pay

## 2019-11-15 ENCOUNTER — Ambulatory Visit (HOSPITAL_COMMUNITY)
Admission: RE | Admit: 2019-11-15 | Discharge: 2019-11-15 | Disposition: A | Discharge status: Home / Self Care | Payer: Medicare Other | Source: Ambulatory Visit | Attending: Physician Assistant | Admitting: Physician Assistant

## 2019-11-15 DIAGNOSIS — R1314 Dysphagia, pharyngoesophageal phase: Secondary | ICD-10-CM | POA: Diagnosis not present

## 2019-11-17 ENCOUNTER — Other Ambulatory Visit: Payer: Self-pay | Admitting: Internal Medicine

## 2019-11-17 LAB — SARS CORONAVIRUS 2 (TAT 6-24 HRS): SARS Coronavirus 2: NEGATIVE

## 2019-11-19 ENCOUNTER — Other Ambulatory Visit: Payer: Self-pay

## 2019-11-19 ENCOUNTER — Encounter: Payer: Self-pay | Admitting: Internal Medicine

## 2019-11-19 ENCOUNTER — Ambulatory Visit (AMBULATORY_SURGERY_CENTER): Payer: Medicare Other | Admitting: Internal Medicine

## 2019-11-19 VITALS — BP 116/52 | HR 76 | Temp 97.1°F | Resp 22 | Ht 61.0 in | Wt 192.0 lb

## 2019-11-19 DIAGNOSIS — K3184 Gastroparesis: Secondary | ICD-10-CM

## 2019-11-19 DIAGNOSIS — K219 Gastro-esophageal reflux disease without esophagitis: Secondary | ICD-10-CM

## 2019-11-19 DIAGNOSIS — R1314 Dysphagia, pharyngoesophageal phase: Secondary | ICD-10-CM

## 2019-11-19 MED ORDER — SODIUM CHLORIDE 0.9 % IV SOLN
500.0000 mL | Freq: Once | INTRAVENOUS | Status: DC
Start: 2019-11-19 — End: 2019-11-19

## 2019-11-19 NOTE — Progress Notes (Signed)
To PACU, VSS. Report to [email or web address] 

## 2019-11-19 NOTE — Patient Instructions (Signed)
Discharge instructions given. Resume previous medications. YOU HAD AN ENDOSCOPIC PROCEDURE TODAY AT [facility]:   Refer to the procedure report that was given to you for any specific questions about what was found during the examination.  If the procedure report does not answer your questions, please call your gastroenterologist to clarify.  If you requested that your care partner not be given the details of your procedure findings, then the procedure report has been included in a sealed envelope for you to review at your convenience later.  YOU SHOULD EXPECT: Some feelings of bloating in the abdomen. Passage of more gas than usual.  Walking can help get rid of the air that was put into your GI tract during the procedure and reduce the bloating. If you had a lower endoscopy (such as a colonoscopy or flexible sigmoidoscopy) you may notice spotting of blood in your stool or on the toilet paper. If you underwent a bowel prep for your procedure, you may not have a normal bowel movement for a few days.  Please Note:  You might notice some irritation and congestion in your nose or some drainage.  This is from the oxygen used during your procedure.  There is no need for concern and it should clear up in a day or so.  SYMPTOMS TO REPORT IMMEDIATELY:   Following upper endoscopy (EGD)  Vomiting of blood or coffee ground material  New chest pain or pain under the shoulder blades  Painful or persistently difficult swallowing  New shortness of breath  Fever of 100F or higher  Black, tarry-looking stools  For urgent or emergent issues, a gastroenterologist can be reached at any hour by calling [phone]. Do not use MyChart messaging for urgent concerns.    DIET:  We do recommend a small meal at first, but then you may proceed to your regular diet.  Drink plenty of fluids but you should avoid alcoholic beverages for 24 hours.  ACTIVITY:  You should plan to take it easy for the rest  of today and you should NOT DRIVE or use heavy machinery until tomorrow (because of the sedation medicines used during the test).    FOLLOW UP: Our staff will call the number listed on your records 48-72 hours following your procedure to check on you and address any questions or concerns that you may have regarding the information given to you following your procedure. If we do not reach you, we will leave a message.  We will attempt to reach you two times.  During this call, we will ask if you have developed any symptoms of COVID 19. If you develop any symptoms (ie: fever, flu-like symptoms, shortness of breath, cough etc.) before then, please call [phone].  If you test positive for Covid 19 in the 2 weeks post procedure, please call and report this information to [place].    If any biopsies were taken you will be contacted by phone or by letter within the next 1-3 weeks.  Please call us at [phone] if you have not heard about the biopsies in 3 weeks.    SIGNATURES/CONFIDENTIALITY: You and/or your care partner have signed paperwork which will be entered into your electronic medical record.  These signatures attest to the fact that that the information above on your After Visit Summary has been reviewed and is understood.  Full responsibility of the confidentiality of this discharge information lies with you and/or your care-partner.

## 2019-11-19 NOTE — Progress Notes (Signed)
Medical history reviewed with no changes noted. VS assessed by C.W 

## 2019-11-19 NOTE — Op Note (Signed)
[facility] Patient Name: [name] Procedure Date: 11/19/2019 3:25 PM MRN: [id number] Endoscopist: Docia Chuck. Henrene Pastor MD, MD Age: 69 Referring MD:  Date of Birth: 1950/08/29 Gender: Female Account #: [id number] Procedure:                Upper GI endoscopy Indications:              Iron deficiency anemia, Dyspepsia, Dysphagia Medicines:                Monitored Anesthesia Care Procedure:                Pre-Anesthesia Assessment:                           - Prior to the procedure, a History and Physical                            was performed, and patient medications and                            allergies were reviewed. The patient's tolerance of                            previous anesthesia was also reviewed. The risks                            and benefits of the procedure and the sedation                            options and risks were discussed with the patient.                            All questions were answered, and informed consent                            was obtained. Prior Anticoagulants: The patient has                            taken no previous anticoagulant or antiplatelet                            agents. ASA Grade Assessment: III - A patient with                            severe systemic disease. After reviewing the risks                            and benefits, the patient was deemed in                            satisfactory condition to undergo the procedure.                           After obtaining informed consent, the endoscope was  passed under direct vision. Throughout the                            procedure, the patient's blood pressure, pulse, and                            oxygen saturations were monitored continuously. The                            Endoscope was introduced through the mouth, and                            advanced to the second part of duodenum. The upper                            GI  endoscopy was accomplished without difficulty.                            The patient tolerated the procedure well. Scope In: Scope Out: Findings:                 The esophagus was somewhat tortuous throughout.                            Questionable large caliber distal ring (not dilated                            due to retained gastric contents).                           The stomach revealed large volume retained gastric                            contents and an isolated prepyloric erosion.                           The examined duodenum was normal.                           The cardia and gastric fundus were normal on                            retroflexion, though view somewhat obscured by                            retained gastric contents. Complications:            No immediate complications. Estimated Blood Loss:     Estimated blood loss: none. Impression:               1. Gastroparesis with large volume retained gastric                            contents. Suspect secondary to narcotics  2. Small prepyloric erosion. Recommendation:           1. Patient has a contact number available for                            emergencies. The signs and symptoms of potential                            delayed complications were discussed with the                            patient. Return to normal activities tomorrow.                            Written discharge instructions were provided to the                            patient.                           2. Gastroparesis diet.                           3. Minimize narcotics                           4. Continue iron therapy daily                           5. Follow-up with your hematologist her PCP to                            monitor blood counts response to iron therapy. Docia Chuck. Henrene Pastor MD, MD 11/19/2019 3:53:29 PM This report has been signed electronically.

## 2019-11-23 ENCOUNTER — Telehealth: Payer: Self-pay

## 2019-11-23 NOTE — Telephone Encounter (Signed)
  Follow up Call-  Call back number 11/19/2019  Post procedure Call Back phone  # [phone]  Permission to leave phone message Yes  Some recent data might be hidden     Patient questions:  Do you have a fever, pain , or abdominal swelling? No. Pain Score  0 *  Have you tolerated food without any problems? Yes.    Have you been able to return to your normal activities? Yes.    Do you have any questions about your discharge instructions: Diet   No. Medications  No. Follow up visit  No.  Do you have questions or concerns about your Care? No.  Actions: * If pain score is 4 or above: No action needed, pain <4.   1. Have you developed a fever since your procedure? No   2.   Have you had an respiratory symptoms (SOB or cough) since your procedure? No   3.   Have you tested positive for COVID 19 since your procedure? No   4.   Have you had any family members/close contacts diagnosed with the COVID 19 since your procedure?  No    If yes to any of these questions please route to Joylene John, RN and Joella Prince, RN

## 2019-11-29 ENCOUNTER — Other Ambulatory Visit: Payer: Self-pay | Admitting: Adult Health

## 2019-11-29 DIAGNOSIS — F17210 Nicotine dependence, cigarettes, uncomplicated: Secondary | ICD-10-CM

## 2019-12-20 ENCOUNTER — Ambulatory Visit
Admission: RE | Admit: 2019-12-20 | Discharge: 2019-12-20 | Disposition: A | Discharge status: Home / Self Care | Payer: Medicare Other | Source: Ambulatory Visit | Attending: Endocrinology | Admitting: Endocrinology

## 2019-12-20 ENCOUNTER — Other Ambulatory Visit: Payer: Self-pay

## 2019-12-20 DIAGNOSIS — F17210 Nicotine dependence, cigarettes, uncomplicated: Secondary | ICD-10-CM

## 2019-12-21 ENCOUNTER — Other Ambulatory Visit: Payer: Self-pay | Admitting: Cardiology

## 2019-12-21 ENCOUNTER — Ambulatory Visit: Payer: Medicare Other | Admitting: Cardiology

## 2019-12-21 ENCOUNTER — Ambulatory Visit: Payer: Medicare Other

## 2019-12-21 VITALS — BP 158/80 | HR 83 | Resp 16 | Ht 61.0 in | Wt 189.0 lb

## 2019-12-21 DIAGNOSIS — I951 Orthostatic hypotension: Secondary | ICD-10-CM

## 2019-12-21 DIAGNOSIS — I739 Peripheral vascular disease, unspecified: Secondary | ICD-10-CM

## 2019-12-21 DIAGNOSIS — I6523 Occlusion and stenosis of bilateral carotid arteries: Secondary | ICD-10-CM

## 2019-12-21 NOTE — Progress Notes (Signed)
Vitals with BMI 12/21/2019 11/19/2019 11/19/2019  Height 5\' 1"  - -  Weight 189 lbs - -  BMI 22.29 - -  Systolic 798 921 194  Diastolic 80 52 43  Pulse 83 76 84  Some encounter information is confidential and restricted. Go to Review Flowsheets activity to see all data.    Orthostatic VS for the past 72 hrs (Last 3 readings):  Orthostatic BP Patient Position BP Location Cuff Size Orthostatic Pulse  12/21/19 1138 128/83 Standing Left Arm Normal 93  12/21/19 1137 137/75 Sitting Left Arm Normal 86  12/21/19 1135 163/87 Supine Left Arm Normal 83    Current Outpatient Medications on File Prior to Visit  Medication Sig Dispense Refill  . albuterol (PROVENTIL HFA;VENTOLIN HFA) 108 (90 Base) MCG/ACT inhaler Inhale 2 puffs into the lungs every 4 (four) hours as needed for wheezing or shortness of breath (cough). 1 Inhaler 1  . amLODipine (NORVASC) 5 MG tablet TAKE 1 TABLET BY MOUTH EVERY DAY 90 tablet 1  . aspirin EC 81 MG tablet Take 1 tablet (81 mg total) by mouth 2 (two) times daily. (Patient taking differently: Take 81 mg by mouth daily. ) 60 tablet 0  . atorvastatin (LIPITOR) 20 MG tablet Take 20 mg by mouth daily.    . busPIRone (BUSPAR) 5 MG tablet Take 1 tablet (5 mg total) by mouth 2 (two) times daily. 60 tablet 0  . DULoxetine (CYMBALTA) 60 MG capsule Take 60 mg by mouth daily after breakfast.     . gemfibrozil (LOPID) 600 MG tablet Take 600 mg by mouth 2 (two) times daily before a meal.    . losartan-hydrochlorothiazide (HYZAAR) 50-12.5 MG tablet TAKE 1 TABLET BY MOUTH EVERY DAY IN THE MORNING 90 tablet 3  . methocarbamol (ROBAXIN) 500 MG tablet Take 1 tablet (500 mg total) by mouth every 6 (six) hours as needed for muscle spasms. (Patient taking differently: Take 500 mg by mouth See admin instructions. Take one tablet (500 mg) by mouth daily at bedtime, may also take one tablet (500 mg) during the day as needed for muscle spasms) 60 tablet 0  . metoCLOPramide (REGLAN) 5 MG tablet Take 1  tablet (5 mg total) by mouth 3 (three) times daily before meals. 20 to 30 minutes before meals (Patient not taking: Reported on 11/19/2019) 90 tablet 2  . nitroGLYCERIN (NITROSTAT) 0.4 MG SL tablet Place 0.4 mg under the tongue every 5 (five) minutes x 3 doses as needed for chest pain.  (Patient not taking: Reported on 11/19/2019)  1  . OVER THE COUNTER MEDICATION Apply 1 application topically as needed (joint pain). Magnilife topicall muscle rub     . oxyCODONE-acetaminophen (PERCOCET) 7.5-325 MG tablet Take 1 tablet by mouth every 4 (four) hours as needed for severe pain. 30 tablet 0  . pantoprazole (PROTONIX) 40 MG tablet TAKE 1 TABLET (40 MG TOTAL) BY MOUTH 2 (TWO) TIMES DAILY BEFORE A MEAL. 180 tablet 1  . rOPINIRole (REQUIP) 1 MG tablet Take 1 mg by mouth 3 (three) times daily.     . traZODone (DESYREL) 50 MG tablet TAKE 0.5-1 TABLETS (25-50 MG TOTAL) BY MOUTH AT BEDTIME AS NEEDED FOR SLEEP. 90 tablet 1   No current facility-administered medications on file prior to visit.       ICD-10-CM   1. Orthostatic hypotension  I95.1   2. Claudication in peripheral vascular disease (HCC)  I73.9 PCV LOWER ARTERIAL (BILATERAL)    Patient was seen in the office today as she  complained of dizziness, she had carotid artery duplex done today.  I checked orthostatics, she is clearly orthostatic.  Advised her to change amlodipine to be taking in the evening and if she is already taking it in the evening she'll cut the medicine in 1/2 tablet of 5 mg dose daily.  She has been complaining of bilateral thigh and also hip claudication, will obtain lower extremity arterial duplex.  I will see her back after these tests.   Adrian Prows, MD, [facility] 12/21/2019, 11:58 AM Office: [phone] Pager: [phone]

## 2019-12-23 NOTE — Progress Notes (Signed)
Please change her appointment to 4 weeks. I have also ordered Lower Extremity Arterial Duplex prior to her visit. I will discuss results on the visit. Nothing serious

## 2020-01-18 ENCOUNTER — Other Ambulatory Visit: Payer: Medicare Other

## 2020-01-24 ENCOUNTER — Ambulatory Visit: Payer: Medicare Other | Admitting: Cardiology

## 2020-01-25 ENCOUNTER — Other Ambulatory Visit: Payer: Medicare Other

## 2020-01-25 ENCOUNTER — Other Ambulatory Visit: Payer: Self-pay

## 2020-01-25 ENCOUNTER — Ambulatory Visit: Payer: Medicare Other

## 2020-01-25 DIAGNOSIS — I739 Peripheral vascular disease, unspecified: Secondary | ICD-10-CM

## 2020-01-30 NOTE — Progress Notes (Deleted)
Primary Physician/Referring:  Alvester Chou, NP  Patient ID: [name], female    DOB: [birth year], 70 y.o.   MRN: [id number]  No chief complaint on file.  HPI:    [name]  is a 70 y.o. Caucasian female with history of hypertension, hypertriglyceridemia, tobacco use quit in August 2020, prediabetes mellitus, PAD with history of right SFA angioplasty 08/06/2016 and no significant disease in the left leg, pseudoclaudication from spinal stenosis and also history of venous insufficiency being treated conservatively with resolution of leg edema with use of support stockings, presents here for follow-up of hyperlipidemia and hypertension.  Incidental left renal artery stenosis being managed medically.  [place] f/u of leg pain, hypertension and hyperlipidemia.  She has not had any chest pain but does have chronic dyspnea.  She is presently doing well.  Symptoms of claudication are remained stable.  ***Patient presents for 1 week follow up. At last visit patient was orthostatic and therefore advised to take amlodipine at night or to reduce the dose by 1/2. Patient is presently taking *** mg amlodipine daily. Also ordered lower extremity arterial duplex in view of new thigh and hip claudication.   Past Medical History:  Diagnosis Date  . Anxiety   . Aortic atherosclerosis ([place])   . Bilateral cataracts   . Carpal tunnel syndrome   . Cervical cancer ([place])   . Chronic back pain   . Claudication ([place])   . COPD (chronic obstructive pulmonary disease) (HCC)    wheezing  . DDD (degenerative disc disease), cervical   . DDD (degenerative disc disease), lumbar   . Depression   . Diverticulosis   . Gastroparesis   . GERD (gastroesophageal reflux disease)   . Hearing loss    Bilateral  . History of blood transfusion   . History of colon polyps   . History of migraine   . Hypertension   . Internal hemorrhoids   . Iron deficiency anemia   . Left-sided weakness   . Leg swelling   .  Liver disease    pt unaware  . Lung nodule    several small  . OA (osteoarthritis)    both hands  . Positive ANA (antinuclear antibody)   . Pre-diabetes   . Raynaud's phenomenon   . Restless leg   . Seizures ([place])    no meds for 8 months  . Sleep apnea    does not use her cpap  . Spondylosis   . Stroke [facility]) 2015   no weakness  . 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   rt  . Urge incontinence of urine   . Varicose vein of leg   . Venous insufficiency    Past Surgical History:  Procedure Laterality Date  . BACK SURGERY    . BLEPHAROPLASTY  10/10/2017  . CATARACT EXTRACTION, BILATERAL    . CESAREAN SECTION    . CHOLECYSTECTOMY    . COLONOSCOPY  10/2016  . HAND SURGERY Bilateral    carpal tunnel  . HEMORRHOID SURGERY    . INTERSTIM IMPLANT PLACEMENT    . KNEE SURGERY Left    arthroscopy x3  . LOWER EXTREMITY ANGIOGRAPHY N/A 07/16/2016   Procedure: Lower Extremity Angiography;  Surgeon: Adrian Prows, MD;  Location: [place] [facility];  Service: Cardiovascular;  Laterality: N/A;  . LOWER EXTREMITY INTERVENTION N/A 08/06/2016   Procedure: Lower Extremity Intervention;  Surgeon: Adrian Prows, MD;  Location: [place] [facility];  Service: Cardiovascular;  Laterality: N/A;  . NECK  SURGERY    . OTHER SURGICAL HISTORY  2013   bladder stimulator in back  . PERIPHERAL VASCULAR BALLOON ANGIOPLASTY  08/06/2016   Procedure: Peripheral Vascular Balloon Angioplasty;  Surgeon: Adrian Prows, MD;  Location: [place] [facility];  Service: Cardiovascular;;  Right SFA  . TENNIS ELBOW RELEASE/NIRSCHEL PROCEDURE Right 12/2015  . TOE SURGERY Right    right foot second and fourth toe  . TOTAL HIP ARTHROPLASTY Left   . TOTAL HIP REVISION Left 10/13/2017   Procedure: LEFT TOTAL HIP REVISION ACETABULUM, OPEN REDUCTION INTERNAL WITH BONE GRAFT FIXATION LEFT GREATER TROCHANTERIC FRACTURE;  Surgeon: Frederik Pear, MD;  Location: [facility];  Service: Orthopedics;  Laterality: Left;  . TOTAL KNEE ARTHROPLASTY Right 01/12/2016    Procedure: RIGHT TOTAL KNEE ARTHROPLASTY;  Surgeon: Netta Cedars, MD;  Location: [place];  Service: Orthopedics;  Laterality: Right;  . TUBAL LIGATION    . UPPER GI ENDOSCOPY  10/2016  . VAGINAL HYSTERECTOMY     Social History   Tobacco Use  . Smoking status: Current Every Day Smoker    Packs/day: 1.00    Years: 55.00    Pack years: 55.00    Types: Cigarettes  . Smokeless tobacco: Never Used  Substance Use Topics  . Alcohol use: No    Alcohol/week: 0.0 standard drinks    ROS  Review of Systems  Constitutional: Positive for weight gain.  Cardiovascular: Positive for claudication (neurogenic) and dyspnea on exertion. Negative for leg swelling and syncope.  Endocrine: Negative for cold intolerance.  Hematologic/Lymphatic: Does not bruise/bleed easily.  Musculoskeletal: Positive for arthritis, back pain, joint pain and neck pain. Negative for joint swelling.  Gastrointestinal: Negative for hematochezia and melena.  Neurological: Positive for dizziness (occasional and chronic). Negative for headaches and light-headedness.  Psychiatric/Behavioral: Negative for depression and substance abuse.  All other systems reviewed and are negative.  Objective  There were no vitals taken for this visit.  Vitals with BMI 12/21/2019 11/19/2019 11/19/2019  Height 5\' 1"  - -  Weight 189 lbs - -  BMI 40.34 - -  Systolic 742 595 638  Diastolic 80 52 43  Pulse 83 76 84  Some encounter information is confidential and restricted. Go to Review Flowsheets activity to see all data.    ***No data found.     Physical Exam Constitutional:      Comments: She has short stitch her and moderately obese in no acute distress.  Eyes:     Conjunctiva/sclera: Conjunctivae normal.  Neck:     Thyroid: No thyromegaly.     Vascular: No JVD.  Cardiovascular:     Rate and Rhythm: Normal rate and regular rhythm.     Pulses: Intact distal pulses.          Carotid pulses are on the right side with bruit and on the  left side with bruit.      Femoral pulses are 2+ on the right side and 2+ on the left side.      Popliteal pulses are 2+ on the right side and 2+ on the left side.       Dorsalis pedis pulses are 2+ on the right side and 2+ on the left side.       Posterior tibial pulses are 2+ on the right side and 2+ on the left side.     Heart sounds: Normal heart sounds. No murmur heard. No gallop.      Comments: No leg edema, no JVD. Superficial varicosities noted.  Normal vascular exam with bilateral carotid bruit. Pulmonary:     Effort: Pulmonary effort is normal.     Breath sounds: Normal breath sounds.  Abdominal:     General: Bowel sounds are normal.     Palpations: Abdomen is soft.     Comments: Obese   Musculoskeletal:        General: Normal range of motion.     Cervical back: Neck supple.  Skin:    General: Skin is warm and dry.  Neurological:     Mental Status: She is alert.    Laboratory examination:   Recent Labs    02/03/19 0904 09/28/19 0948  NA 138 140  K 4.4 4.4  CL 99 102  CO2 26 23  GLUCOSE 115* 123*  BUN 14 12  CREATININE 0.74 0.78  CALCIUM 9.4 9.3  GFRNONAA 83 77  GFRAA 96 89   CrCl cannot be calculated (Patient's most recent lab result is older than the maximum 21 days allowed.).  CMP Latest Ref Rng & Units 09/28/2019 02/03/2019 02/19/2018  Glucose 65 - 99 mg/dL 123(H) 115(H) 110(H)  BUN 8 - 27 mg/dL 12 14 13   Creatinine 0.57 - 1.00 mg/dL 0.78 0.74 0.75  Sodium 134 - 144 mmol/L 140 138 138  Potassium 3.5 - 5.2 mmol/L 4.4 4.4 4.2  Chloride 96 - 106 mmol/L 102 99 98  CO2 20 - 29 mmol/L 23 26 23   Calcium 8.7 - 10.3 mg/dL 9.3 9.4 9.2  Total Protein 6.0 - 8.5 g/dL 7.0 6.9 6.6  Total Bilirubin 0.0 - 1.2 mg/dL <0.2 0.2 <0.2  Alkaline Phos 44 - 121 IU/L 149(H) 145(H) 131(H)  AST 0 - 40 IU/L 15 20 17   ALT 0 - 32 IU/L 17 22 16    CBC Latest Ref Rng & Units 09/28/2019 02/03/2019 07/30/2018  WBC 3.4 - 10.8 x10E3/uL 5.4 5.8 6.3  Hemoglobin 11.1 - 15.9 g/dL 9.8(L) 13.5  13.7  Hematocrit 34.0 - 46.6 % 32.9(L) 41.6 42.1  Platelets 150 - 450 x10E3/uL 272 309 226   Lipid Panel Recent Labs    02/03/19 0904 09/28/19 0948  CHOL 169 148  TRIG 163* 130  LDLCALC 100* 86  HDL 40 39*    HEMOGLOBIN A1C Lab Results  Component Value Date   HGBA1C 6.1 (H) 01/02/2016   MPG 128 01/02/2016   TSH Recent Labs    02/03/19 0904  TSH 2.430   Medications and allergies   Allergies  Allergen Reactions  . Bee Venom Anaphylaxis  . Gabapentin Other (See Comments)    "Bad dreams"  . Morphine And Related Other (See Comments)    Overly sedated  . Codeine Hives  . Adhesive [Tape] Other (See Comments)    Redness, Paper tape is ok  . Sulfa Antibiotics Other (See Comments)    Unknown childhood reaction  . Sulfonamide Derivatives Other (See Comments)    Unknown childhood reaction     Current Outpatient Medications  Medication Instructions  . albuterol (PROVENTIL HFA;VENTOLIN HFA) 108 (90 Base) MCG/ACT inhaler 2 puffs, Inhalation, Every 4 hours PRN  . amLODipine (NORVASC) 5 MG tablet TAKE 1 TABLET BY MOUTH EVERY DAY  . aspirin EC 81 mg, Oral, 2 times daily  . atorvastatin (LIPITOR) 20 mg, Oral, Daily  . busPIRone (BUSPAR) 5 mg, Oral, 2 times daily  . DULoxetine (CYMBALTA) 60 mg, Oral, Daily after breakfast  . gemfibrozil (LOPID) 600 mg, Oral, 2 times daily before meals  . losartan-hydrochlorothiazide (HYZAAR) 50-12.5 MG tablet TAKE 1 TABLET  BY MOUTH EVERY DAY IN THE MORNING  . methocarbamol (ROBAXIN) 500 mg, Oral, Every 6 hours PRN  . metoCLOPramide (REGLAN) 5 mg, Oral, 3 times daily before meals, 20 to 30 minutes before meals  . nitroGLYCERIN (NITROSTAT) 0.4 mg, Every 5 min x3 PRN  . OVER THE COUNTER MEDICATION 1 application, Topical, As needed, Magnilife topicall muscle rub  . oxyCODONE-acetaminophen (PERCOCET) 7.5-325 MG tablet 1 tablet, Oral, Every 4 hours PRN  . pantoprazole (PROTONIX) 40 mg, Oral, 2 times daily before meals  . rOPINIRole (REQUIP) 1 mg,  Oral, 3 times daily  . traZODone (DESYREL) 25-50 mg, Oral, At bedtime PRN    Radiology:  No results found.  Cardiac Studies:   Lower extremity venous insufficiency study 07/15/2017: Venous insufficiency of the right great saphenous vein associated with communicating varicosities.  Small varicosities communicate in the proximal right short saphenous vein.  Mild varicosity of the left proximal calf that communicate with left GSB.  She does not demonstrate arterial insufficiency. Conservative therapy: Dr. Kathlene Cote (IR)   Peripheral arteriogram 07/16/2016:  Right SFA 95% stenosis distal. Otherwise 3 vessel R/O bilateral lower extremity.  Left renal artery 95% stenosis.   08/06/2016: Patient randomized in [facility] DCB Study: Successful PTA and drug coated balloon angioplasty with a 5.0 x 60 mm  Inpact Admiral balloon of the right mid SFA, stenosis reduced from 99% to 0%. Mild disease left.  Nuclear stress test  12/29/2017: 1. Lexiscan stress test was performed. Exercise capacity was not assessed. Stress symptoms included headache. Peak effect blood pressure was 172/68 mmHg. The resting and stress electrocardiogram demonstrated normal sinus rhythm, normal resting conduction, no resting arrhythmias and normal rest repolarization. Stress EKG is non diagnostic for ischemia as it is a pharmacologic stress. 2. The overall quality of the study is good. There is no evidence of abnormal lung activity. Stress SPECT images demonstrate homogeneous tracer distribution throughout the myocardium. Decreased tracer uptake in basal inferoseptal myocardium seen only on SPECT rest images likely represents tissue attenuation artifact. Gated SPECT imaging reveals normal myocardial thickening and wall motion. The left ventricular ejection fraction was normal (68%). 3. Low risk study.  Carotid artery duplex  12/23/2018: Stenosis in the right internal carotid artery (16-49%). Stenosis in the right external carotid artery  (<50%). Stenosis in the left internal carotid artery (1-15%). Stenosis in the left external carotid artery (<50%). Antegrade right vertebral artery flow. Antegrade left vertebral artery flow. No significant change from 06/16/2018. Follow up in one year is appropriate if clinically indicated.  Lower Extremity Arterial Duplex 01/25/2020:  No hemodynamically significant stenoses are identified in the right and  left lower extremity arterial system.   This exam reveals mildly decreased perfusion of the right lower extremity, noted at the post tibial artery level (ABI 0.83) and moderately decreased perfusion of the left lower extremity, noted at the anterior tibial and post tibial artery level (ABI 0.72).  No significant change from 11/27/2015.   EKG   EKG 08/23/2019: Normal sinus rhythm at rate of 85 bpm, left atrial enlargement, normal axis.  Poor R wave progression, cannot exclude anteroseptal infarct old.  Low-voltage complexes.  Pulmonary disease pattern.  No significant change from 01/13/2019.  EKG 01/13/2019: Normal sinus rhythm at the rate of 85 bpm, left atrial enlargement, normal axis.  Poor R-wave progression, cannot exclude anteroseptal infarct old.  No evidence of ischemia.  Low-voltage complexes.  Pulmonary disease pattern. No significant change from  EKG 11/17/2017   Assessment     ICD-10-CM  1. Orthostatic hypotension  I95.1   2. Claudication in peripheral vascular disease (HCC)  I73.9   3. Asymptomatic bilateral carotid artery stenosis  I65.23     No orders of the defined types were placed in this encounter.  There are no discontinued medications.  Recommendations:   [name]  is a 70 y.o. Caucasian female with history of hypertension, hypertriglyceridemia, tobacco use quit in August 2020, prediabetes mellitus, PAD with history of right SFA angioplasty 08/06/2016 and no significant disease in the left leg, pseudoclaudication from spinal stenosis and also history of  venous insufficiency being treated conservatively with resolution of leg edema with use of support stockings, presents here for follow-up of hyperlipidemia and hypertension.  Incidental left renal artery stenosis being managed medically.  She is presently doing well, no symptoms of claudication, no clinical evidence of heart failure, blood pressure is well controlled.  Previously her lipids had been elevated in January 2021, I will repeat lipid profile testing.  Patient has developed craving for ice, thinks she may have again developed iron deficiency anemia.  (Pagophagia)I will obtain CBC along with iron studies.  I will also obtain a CMP to follow-up on renal function.  She has started to smoke again, although smoking less, I have again discussed with her regarding complete abstinence from tobacco use.  Patient was seen in the office today as she complained of dizziness, she had carotid artery duplex done today.  I checked orthostatics, she is clearly orthostatic.  Advised her to change amlodipine to be taking in the evening and if she is already taking it in the evening she'll cut the medicine in 1/2 tablet of 5 mg dose daily.  She has been complaining of bilateral thigh and also hip claudication, will obtain lower extremity arterial duplex.  I will see her back after these tests.  ***LE arterial duplex = stable, unchanged. Orthostatics? BP? Smoking?

## 2020-02-01 ENCOUNTER — Telehealth: Payer: Self-pay | Admitting: Student

## 2020-02-01 ENCOUNTER — Ambulatory Visit: Payer: Medicare Other | Admitting: Student

## 2020-02-01 DIAGNOSIS — I6523 Occlusion and stenosis of bilateral carotid arteries: Secondary | ICD-10-CM

## 2020-02-01 DIAGNOSIS — I739 Peripheral vascular disease, unspecified: Secondary | ICD-10-CM

## 2020-02-01 DIAGNOSIS — I951 Orthostatic hypotension: Secondary | ICD-10-CM

## 2020-02-01 NOTE — Telephone Encounter (Signed)
Called patient and discussed results of lower extremity arterial duplex and carotid artery duplex.  Patient verbalized understanding of results.  She is unsure if she is taking Lipitor at this time.  Patient has appointment in the office in 2 days, asked her to keep follow-up appointment and to bring all medications with her at this time.  Would recommend medical management of carotid artery disease and risk management of peripheral arterial disease.

## 2020-02-01 NOTE — Progress Notes (Deleted)
Primary Physician/Referring:  Alvester Chou, NP  Patient ID: [name], female    DOB: 02-09-1950, 70 y.o.   MRN: [id number]  No chief complaint on file.  HPI:    [name]  is a 70 y.o. Caucasian female with history of hypertension, hypertriglyceridemia, tobacco use quit in August 2020, prediabetes mellitus, PAD with history of right SFA angioplasty 08/06/2016 and no significant disease in the left leg, pseudoclaudication from spinal stenosis and also history of venous insufficiency being treated conservatively with resolution of leg edema with use of support stockings, presents here for follow-up of hyperlipidemia and hypertension.  Incidental left renal artery stenosis being managed medically.  [place] f/u of leg pain, hypertension and hyperlipidemia.  She has not had any chest pain but does have chronic dyspnea.  She is presently doing well.  Symptoms of claudication are remained stable.  ***Patient presents for 1 week follow up. At last visit patient was orthostatic and therefore advised to take amlodipine at night or to reduce the dose by 1/2. Patient is presently taking *** mg amlodipine daily. Also ordered lower extremity arterial duplex in view of new thigh and hip claudication.   Past Medical History:  Diagnosis Date  . Anxiety   . Aortic atherosclerosis ([place])   . Bilateral cataracts   . Carpal tunnel syndrome   . Cervical cancer ([place])   . Chronic back pain   . Claudication ([place])   . COPD (chronic obstructive pulmonary disease) ([facility])    wheezing  . DDD (degenerative disc disease), cervical   . DDD (degenerative disc disease), lumbar   . Depression   . Diverticulosis   . Gastroparesis   . GERD (gastroesophageal reflux disease)   . Hearing loss    Bilateral  . History of blood transfusion   . History of colon polyps   . History of migraine   . Hypertension   . Internal hemorrhoids   . Iron deficiency anemia   . Left-sided weakness   . Leg swelling   .  Liver disease    pt unaware  . Lung nodule    several small  . OA (osteoarthritis)    both hands  . Positive ANA (antinuclear antibody)   . Pre-diabetes   . Raynaud's phenomenon   . Restless leg   . Seizures ([place])    no meds for 8 months  . Sleep apnea    does not use her cpap  . Spondylosis   . Stroke [facility]) 2015   no weakness  . 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   rt  . Urge incontinence of urine   . Varicose vein of leg   . Venous insufficiency    Past Surgical History:  Procedure Laterality Date  . BACK SURGERY    . BLEPHAROPLASTY  10/10/2017  . CATARACT EXTRACTION, BILATERAL    . CESAREAN SECTION    . CHOLECYSTECTOMY    . COLONOSCOPY  10/2016  . HAND SURGERY Bilateral    carpal tunnel  . HEMORRHOID SURGERY    . INTERSTIM IMPLANT PLACEMENT    . KNEE SURGERY Left    arthroscopy x3  . LOWER EXTREMITY ANGIOGRAPHY N/A 07/16/2016   Procedure: Lower Extremity Angiography;  Surgeon: Adrian Prows, MD;  Location: [facility];  Service: Cardiovascular;  Laterality: N/A;  . LOWER EXTREMITY INTERVENTION N/A 08/06/2016   Procedure: Lower Extremity Intervention;  Surgeon: Adrian Prows, MD;  Location: [facility];  Service: Cardiovascular;  Laterality: N/A;  . NECK  SURGERY    . OTHER SURGICAL HISTORY  2013   bladder stimulator in back  . PERIPHERAL VASCULAR BALLOON ANGIOPLASTY  08/06/2016   Procedure: Peripheral Vascular Balloon Angioplasty;  Surgeon: Adrian Prows, MD;  Location: [facility];  Service: Cardiovascular;;  Right SFA  . TENNIS ELBOW RELEASE/NIRSCHEL PROCEDURE Right 12/2015  . TOE SURGERY Right    right foot second and fourth toe  . TOTAL HIP ARTHROPLASTY Left   . TOTAL HIP REVISION Left 10/13/2017   Procedure: LEFT TOTAL HIP REVISION ACETABULUM, OPEN REDUCTION INTERNAL WITH BONE GRAFT FIXATION LEFT GREATER TROCHANTERIC FRACTURE;  Surgeon: Frederik Pear, MD;  Location: [facility];  Service: Orthopedics;  Laterality: Left;  . TOTAL KNEE ARTHROPLASTY Right 01/12/2016    Procedure: RIGHT TOTAL KNEE ARTHROPLASTY;  Surgeon: Netta Cedars, MD;  Location: [facility];  Service: Orthopedics;  Laterality: Right;  . TUBAL LIGATION    . UPPER GI ENDOSCOPY  10/2016  . VAGINAL HYSTERECTOMY     Social History   Tobacco Use  . Smoking status: Current Every Day Smoker    Packs/day: 1.00    Years: 55.00    Pack years: 55.00    Types: Cigarettes  . Smokeless tobacco: Never Used  Substance Use Topics  . Alcohol use: No    Alcohol/week: 0.0 standard drinks    ROS  Review of Systems  Constitutional: Positive for weight gain.  Cardiovascular: Positive for claudication (neurogenic) and dyspnea on exertion. Negative for leg swelling and syncope.  Endocrine: Negative for cold intolerance.  Hematologic/Lymphatic: Does not bruise/bleed easily.  Musculoskeletal: Positive for arthritis, back pain, joint pain and neck pain. Negative for joint swelling.  Gastrointestinal: Negative for hematochezia and melena.  Neurological: Positive for dizziness (occasional and chronic). Negative for headaches and light-headedness.  Psychiatric/Behavioral: Negative for depression and substance abuse.  All other systems reviewed and are negative.  Objective  There were no vitals taken for this visit.  Vitals with BMI 12/21/2019 11/19/2019 11/19/2019  Height 5\' 1"  - -  Weight 189 lbs - -  BMI 81.15 - -  Systolic 726 203 559  Diastolic 80 52 43  Pulse 83 76 84  Some encounter information is confidential and restricted. Go to Review Flowsheets activity to see all data.    ***No data found.     Physical Exam Constitutional:      Comments: She has short stitch her and moderately obese in no acute distress.  Eyes:     Conjunctiva/sclera: Conjunctivae normal.  Neck:     Thyroid: No thyromegaly.     Vascular: No JVD.  Cardiovascular:     Rate and Rhythm: Normal rate and regular rhythm.     Pulses: Intact distal pulses.          Carotid pulses are on the right side with bruit and on the  left side with bruit.      Femoral pulses are 2+ on the right side and 2+ on the left side.      Popliteal pulses are 2+ on the right side and 2+ on the left side.       Dorsalis pedis pulses are 2+ on the right side and 2+ on the left side.       Posterior tibial pulses are 2+ on the right side and 2+ on the left side.     Heart sounds: Normal heart sounds. No murmur heard. No gallop.      Comments: No leg edema, no JVD. Superficial varicosities noted.  Normal vascular exam with bilateral carotid bruit. Pulmonary:     Effort: Pulmonary effort is normal.     Breath sounds: Normal breath sounds.  Abdominal:     General: Bowel sounds are normal.     Palpations: Abdomen is soft.     Comments: Obese   Musculoskeletal:        General: Normal range of motion.     Cervical back: Neck supple.  Skin:    General: Skin is warm and dry.  Neurological:     Mental Status: She is alert.    Laboratory examination:   Recent Labs    02/03/19 0904 09/28/19 0948  NA 138 140  K 4.4 4.4  CL 99 102  CO2 26 23  GLUCOSE 115* 123*  BUN 14 12  CREATININE 0.74 0.78  CALCIUM 9.4 9.3  GFRNONAA 83 77  GFRAA 96 89   CrCl cannot be calculated (Patient's most recent lab result is older than the maximum 21 days allowed.).  CMP Latest Ref Rng & Units 09/28/2019 02/03/2019 02/19/2018  Glucose 65 - 99 mg/dL 123(H) 115(H) 110(H)  BUN 8 - 27 mg/dL 12 14 13   Creatinine 0.57 - 1.00 mg/dL 0.78 0.74 0.75  Sodium 134 - 144 mmol/L 140 138 138  Potassium 3.5 - 5.2 mmol/L 4.4 4.4 4.2  Chloride 96 - 106 mmol/L 102 99 98  CO2 20 - 29 mmol/L 23 26 23   Calcium 8.7 - 10.3 mg/dL 9.3 9.4 9.2  Total Protein 6.0 - 8.5 g/dL 7.0 6.9 6.6  Total Bilirubin 0.0 - 1.2 mg/dL <0.2 0.2 <0.2  Alkaline Phos 44 - 121 IU/L 149(H) 145(H) 131(H)  AST 0 - 40 IU/L 15 20 17   ALT 0 - 32 IU/L 17 22 16    CBC Latest Ref Rng & Units 09/28/2019 02/03/2019 07/30/2018  WBC 3.4 - 10.8 x10E3/uL 5.4 5.8 6.3  Hemoglobin 11.1 - 15.9 g/dL 9.8(L) 13.5  13.7  Hematocrit 34.0 - 46.6 % 32.9(L) 41.6 42.1  Platelets 150 - 450 x10E3/uL 272 309 226   Lipid Panel Recent Labs    02/03/19 0904 09/28/19 0948  CHOL 169 148  TRIG 163* 130  LDLCALC 100* 86  HDL 40 39*    HEMOGLOBIN A1C Lab Results  Component Value Date   HGBA1C 6.1 (H) 01/02/2016   MPG 128 01/02/2016   TSH Recent Labs    02/03/19 0904  TSH 2.430   Medications and allergies   Allergies  Allergen Reactions  . Bee Venom Anaphylaxis  . Gabapentin Other (See Comments)    "Bad dreams"  . Morphine And Related Other (See Comments)    Overly sedated  . Codeine Hives  . Adhesive [Tape] Other (See Comments)    Redness, Paper tape is ok  . Sulfa Antibiotics Other (See Comments)    Unknown childhood reaction  . Sulfonamide Derivatives Other (See Comments)    Unknown childhood reaction     Current Outpatient Medications  Medication Instructions  . albuterol (PROVENTIL HFA;VENTOLIN HFA) 108 (90 Base) MCG/ACT inhaler 2 puffs, Inhalation, Every 4 hours PRN  . amLODipine (NORVASC) 5 MG tablet TAKE 1 TABLET BY MOUTH EVERY DAY  . aspirin EC 81 mg, Oral, 2 times daily  . atorvastatin (LIPITOR) 20 mg, Oral, Daily  . busPIRone (BUSPAR) 5 mg, Oral, 2 times daily  . DULoxetine (CYMBALTA) 60 mg, Oral, Daily after breakfast  . gemfibrozil (LOPID) 600 mg, Oral, 2 times daily before meals  . losartan-hydrochlorothiazide (HYZAAR) 50-12.5 MG tablet TAKE 1 TABLET  BY MOUTH EVERY DAY IN THE MORNING  . methocarbamol (ROBAXIN) 500 mg, Oral, Every 6 hours PRN  . metoCLOPramide (REGLAN) 5 mg, Oral, 3 times daily before meals, 20 to 30 minutes before meals  . nitroGLYCERIN (NITROSTAT) 0.4 mg, Every 5 min x3 PRN  . OVER THE COUNTER MEDICATION 1 application, Topical, As needed, Magnilife topicall muscle rub  . oxyCODONE-acetaminophen (PERCOCET) 7.5-325 MG tablet 1 tablet, Oral, Every 4 hours PRN  . pantoprazole (PROTONIX) 40 mg, Oral, 2 times daily before meals  . rOPINIRole (REQUIP) 1 mg,  Oral, 3 times daily  . traZODone (DESYREL) 25-50 mg, Oral, At bedtime PRN    Radiology:  No results found.  Cardiac Studies:   Lower extremity venous insufficiency study 07/15/2017: Venous insufficiency of the right great saphenous vein associated with communicating varicosities.  Small varicosities communicate in the proximal right short saphenous vein.  Mild varicosity of the left proximal calf that communicate with left GSB.  She does not demonstrate arterial insufficiency. Conservative therapy: Dr. Kathlene Cote (IR)   Peripheral arteriogram 07/16/2016:  Right SFA 95% stenosis distal. Otherwise 3 vessel R/O bilateral lower extremity.  Left renal artery 95% stenosis.   08/06/2016: Patient randomized in [facility] DCB Study: Successful PTA and drug coated balloon angioplasty with a 5.0 x 60 mm  Inpact Admiral balloon of the right mid SFA, stenosis reduced from 99% to 0%. Mild disease left.  Nuclear stress test  12/29/2017: 1. Lexiscan stress test was performed. Exercise capacity was not assessed. Stress symptoms included headache. Peak effect blood pressure was 172/68 mmHg. The resting and stress electrocardiogram demonstrated normal sinus rhythm, normal resting conduction, no resting arrhythmias and normal rest repolarization. Stress EKG is non diagnostic for ischemia as it is a pharmacologic stress. 2. The overall quality of the study is good. There is no evidence of abnormal lung activity. Stress SPECT images demonstrate homogeneous tracer distribution throughout the myocardium. Decreased tracer uptake in basal inferoseptal myocardium seen only on SPECT rest images likely represents tissue attenuation artifact. Gated SPECT imaging reveals normal myocardial thickening and wall motion. The left ventricular ejection fraction was normal (68%). 3. Low risk study.  Carotid artery duplex  12/23/2018: Stenosis in the right internal carotid artery (16-49%). Stenosis in the right external carotid artery  (<50%). Stenosis in the left internal carotid artery (1-15%). Stenosis in the left external carotid artery (<50%). Antegrade right vertebral artery flow. Antegrade left vertebral artery flow. No significant change from 06/16/2018. Follow up in one year is appropriate if clinically indicated.  Lower Extremity Arterial Duplex 01/25/2020:  No hemodynamically significant stenoses are identified in the right and  left lower extremity arterial system.   This exam reveals mildly decreased perfusion of the right lower extremity, noted at the post tibial artery level (ABI 0.83) and moderately decreased perfusion of the left lower extremity, noted at the anterior tibial and post tibial artery level (ABI 0.72).  No significant change from 11/27/2015.   EKG   EKG 08/23/2019: Normal sinus rhythm at rate of 85 bpm, left atrial enlargement, normal axis.  Poor R wave progression, cannot exclude anteroseptal infarct old.  Low-voltage complexes.  Pulmonary disease pattern.  No significant change from 01/13/2019.  EKG 01/13/2019: Normal sinus rhythm at the rate of 85 bpm, left atrial enlargement, normal axis.  Poor R-wave progression, cannot exclude anteroseptal infarct old.  No evidence of ischemia.  Low-voltage complexes.  Pulmonary disease pattern. No significant change from  EKG 11/17/2017   Assessment   No diagnosis found.  No orders of the defined types were placed in this encounter.  There are no discontinued medications.  Recommendations:   JULIYA MAGILL  is a 70 y.o. Caucasian female with history of hypertension, hypertriglyceridemia, tobacco use quit in August 2020, prediabetes mellitus, PAD with history of right SFA angioplasty 08/06/2016 and no significant disease in the left leg, pseudoclaudication from spinal stenosis and also history of venous insufficiency being treated conservatively with resolution of leg edema with use of support stockings, presents here for follow-up of hyperlipidemia  and hypertension.  Incidental left renal artery stenosis being managed medically.  She is presently doing well, no symptoms of claudication, no clinical evidence of heart failure, blood pressure is well controlled.  Previously her lipids had been elevated in January 2021, I will repeat lipid profile testing.  Patient has developed craving for ice, thinks she may have again developed iron deficiency anemia.  (Pagophagia)I will obtain CBC along with iron studies.  I will also obtain a CMP to follow-up on renal function.  She has started to smoke again, although smoking less, I have again discussed with her regarding complete abstinence from tobacco use.  Patient was seen in the office today as she complained of dizziness, she had carotid artery duplex done today.  I checked orthostatics, she is clearly orthostatic.  Advised her to change amlodipine to be taking in the evening and if she is already taking it in the evening she'll cut the medicine in 1/2 tablet of 5 mg dose daily.  She has been complaining of bilateral thigh and also hip claudication, will obtain lower extremity arterial duplex.  I will see her back after these tests.  ***LE arterial duplex = stable, unchanged. Orthostatics? BP? Smoking?

## 2020-02-03 ENCOUNTER — Ambulatory Visit: Payer: Medicare Other | Admitting: Student

## 2020-02-25 ENCOUNTER — Ambulatory Visit: Payer: Medicare Other | Admitting: Cardiology

## 2020-03-01 ENCOUNTER — Other Ambulatory Visit: Payer: Self-pay | Admitting: Cardiology

## 2020-03-01 ENCOUNTER — Other Ambulatory Visit: Payer: Self-pay | Admitting: Physician Assistant

## 2020-03-01 DIAGNOSIS — I10 Essential (primary) hypertension: Secondary | ICD-10-CM

## 2020-03-02 NOTE — Progress Notes (Signed)
Primary Physician/Referring:  Alvester Chou, NP  Patient ID: [name], female    DOB: 01/15/1950, 70 y.o.   MRN: [id number]  Chief Complaint  Patient presents with   Hyperlipidemia   Hypertension   PAD   Follow-up    6 month   HPI:    [name]  is a 70 y.o. Caucasian female with history of hypertension, hypertriglyceridemia, tobacco use quit in August 2020, prediabetes mellitus, PAD with history of right SFA angioplasty 08/06/2016 and no significant disease in the left leg, pseudoclaudication from spinal stenosis and also history of venous insufficiency being treated conservatively with resolution of leg edema with use of support stockings, chronic iron deficiency anemia with  PUD and gastroparesis felt due to chronic narcotic use presents here for follow-up of PAD,  hyperlipidemia and hypertension.  She has incidental left renal artery stenosis being managed medically.  Patient presents here for 71-month office visit, last seen by me on 08/23/2019.  However when she came for lower extremity arterial duplex on 10/21/2019, she had complained of marked dizziness and was found to be severely orthostatic. I had changed her to take amlodipine in the evening and reduce the dose to 5 mg.  She now presents for follow-up of orthostasis, hypertension, peripheral arterial disease.  She continues to have marked dizziness. She also states that when she walks her legs easily gives up and she feels like she is going to fall down. She has not had a syncope. Denies chest pain. Dyspnea has remained stable.  Past Medical History:  Diagnosis Date   Anxiety    Aortic atherosclerosis (HCC)    Bilateral cataracts    Carpal tunnel syndrome    Cervical cancer (HCC)    Chronic back pain    Claudication (HCC)    COPD (chronic obstructive pulmonary disease) (HCC)    wheezing   DDD (degenerative disc disease), cervical    DDD (degenerative disc disease), lumbar    Depression     Diverticulosis    Gastroparesis    GERD (gastroesophageal reflux disease)    Hearing loss    Bilateral   History of blood transfusion    History of colon polyps    History of migraine    Hypertension    Internal hemorrhoids    Iron deficiency anemia    Left-sided weakness    Leg swelling    Liver disease    pt unaware   Lung nodule    several small   OA (osteoarthritis)    both hands   Positive ANA (antinuclear antibody)    Pre-diabetes    Raynaud's phenomenon    Restless leg    Seizures (HCC)    no meds for 8 months   Sleep apnea    does not use her cpap   Spondylosis    Stroke ([place]) 2015   no weakness   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   rt   Urge incontinence of urine    Varicose vein of leg    Venous insufficiency    Past Surgical History:  Procedure Laterality Date   BACK SURGERY     BLEPHAROPLASTY  10/10/2017   CATARACT EXTRACTION, BILATERAL     CESAREAN SECTION     CHOLECYSTECTOMY     COLONOSCOPY  10/2016   HAND SURGERY Bilateral    carpal tunnel   HEMORRHOID SURGERY     INTERSTIM IMPLANT PLACEMENT     KNEE SURGERY Left    arthroscopy x3  LOWER EXTREMITY ANGIOGRAPHY N/A 07/16/2016   Procedure: Lower Extremity Angiography;  Surgeon: Adrian Prows, MD;  Location: [facility];  Service: Cardiovascular;  Laterality: N/A;   LOWER EXTREMITY INTERVENTION N/A 08/06/2016   Procedure: Lower Extremity Intervention;  Surgeon: Adrian Prows, MD;  Location: [facility];  Service: Cardiovascular;  Laterality: N/A;   NECK SURGERY     OTHER SURGICAL HISTORY  2013   bladder stimulator in back   PERIPHERAL VASCULAR BALLOON ANGIOPLASTY  08/06/2016   Procedure: Peripheral Vascular Balloon Angioplasty;  Surgeon: Adrian Prows, MD;  Location: [facility];  Service: Cardiovascular;;  Right SFA   TENNIS ELBOW RELEASE/NIRSCHEL PROCEDURE Right 12/2015   TOE SURGERY Right    right foot second and fourth toe   TOTAL HIP  ARTHROPLASTY Left    TOTAL HIP REVISION Left 10/13/2017   Procedure: LEFT TOTAL HIP REVISION ACETABULUM, OPEN REDUCTION INTERNAL WITH BONE GRAFT FIXATION LEFT GREATER TROCHANTERIC FRACTURE;  Surgeon: Frederik Pear, MD;  Location: [facility];  Service: Orthopedics;  Laterality: Left;   TOTAL KNEE ARTHROPLASTY Right 01/12/2016   Procedure: RIGHT TOTAL KNEE ARTHROPLASTY;  Surgeon: Netta Cedars, MD;  Location: [facility];  Service: Orthopedics;  Laterality: Right;   TUBAL LIGATION     UPPER GI ENDOSCOPY  10/2016   VAGINAL HYSTERECTOMY     Social History   Tobacco Use   Smoking status: Current Every Day Smoker    Packs/day: 1.00    Years: 55.00    Pack years: 55.00    Types: Cigarettes   Smokeless tobacco: Never Used  Substance Use Topics   Alcohol use: No    Alcohol/week: 0.0 standard drinks  Marital Status: Married    ROS  Review of Systems  Cardiovascular: Positive for claudication (neurogenic) and dyspnea on exertion. Negative for leg swelling and syncope.  Endocrine: Negative for cold intolerance.  Hematologic/Lymphatic: Does not bruise/bleed easily.  Musculoskeletal: Positive for arthritis, back pain, joint pain and neck pain. Negative for joint swelling.  Gastrointestinal: Negative for dysphagia, hematochezia and melena.  Neurological: Positive for dizziness (occasional and chronic). Negative for headaches and light-headedness.  Psychiatric/Behavioral: Negative for depression and substance abuse.  All other systems reviewed and are negative.  Objective     Vitals:   03/03/20 1055 03/03/20 1103 03/03/20 1104 03/03/20 1105  BP: (!) 149/81 (!) 142/79 133/80 115/67  Pulse: 99 95 95 96  Temp: 98.2 F (36.8 C)     Resp: 17     Height: 5\' 1"  (1.549 m)     Weight: 182 lb (82.6 kg)     SpO2: 96% 97% 97% 98%  TempSrc: Temporal     BMI (Calculated): 34.41       Orthostatic VS for the past 72 hrs (Last 3 readings):  Patient Position BP Location Cuff Size  03/03/20 1105 Standing  Left Arm Large  03/03/20 1104 Sitting Left Arm Large  03/03/20 1103 Supine Left Arm Large     Physical Exam Constitutional:      Comments: She has short stature and moderately obese in no acute distress.  Eyes:     Conjunctiva/sclera: Conjunctivae normal.  Neck:     Thyroid: No thyromegaly.     Vascular: No JVD.  Cardiovascular:     Rate and Rhythm: Normal rate and regular rhythm.     Pulses: Intact distal pulses.          Carotid pulses are on the right side with bruit and on the left side with  bruit.      Femoral pulses are 2+ on the right side and 2+ on the left side.      Popliteal pulses are 2+ on the right side and 2+ on the left side.       Dorsalis pedis pulses are 2+ on the right side and 2+ on the left side.       Posterior tibial pulses are 2+ on the right side and 2+ on the left side.     Heart sounds: Normal heart sounds. No murmur heard. No gallop.      Comments: No leg edema, no JVD. Superficial varicosities noted.  Normal vascular exam with bilateral carotid bruit. Pulmonary:     Effort: Pulmonary effort is normal.     Breath sounds: Normal breath sounds.  Abdominal:     General: Bowel sounds are normal.     Palpations: Abdomen is soft.     Comments: Obese   Musculoskeletal:        General: Normal range of motion.     Cervical back: Neck supple.  Skin:    General: Skin is warm and dry.  Neurological:     Mental Status: She is alert.    Laboratory examination:   Recent Labs    09/28/19 0948  NA 140  K 4.4  CL 102  CO2 23  GLUCOSE 123*  BUN 12  CREATININE 0.78  CALCIUM 9.3  GFRNONAA 77  GFRAA 89   CrCl cannot be calculated (Patient's most recent lab result is older than the maximum 21 days allowed.).  CMP Latest Ref Rng & Units 09/28/2019 02/03/2019 02/19/2018  Glucose 65 - 99 mg/dL 123(H) 115(H) 110(H)  BUN 8 - 27 mg/dL 12 14 13   Creatinine 0.57 - 1.00 mg/dL 0.78 0.74 0.75  Sodium 134 - 144 mmol/L 140 138 138  Potassium 3.5 - 5.2 mmol/L 4.4 4.4  4.2  Chloride 96 - 106 mmol/L 102 99 98  CO2 20 - 29 mmol/L 23 26 23   Calcium 8.7 - 10.3 mg/dL 9.3 9.4 9.2  Total Protein 6.0 - 8.5 g/dL 7.0 6.9 6.6  Total Bilirubin 0.0 - 1.2 mg/dL <0.2 0.2 <0.2  Alkaline Phos 44 - 121 IU/L 149(H) 145(H) 131(H)  AST 0 - 40 IU/L 15 20 17   ALT 0 - 32 IU/L 17 22 16    CBC Latest Ref Rng & Units 09/28/2019 02/03/2019 07/30/2018  WBC 3.4 - 10.8 x10E3/uL 5.4 5.8 6.3  Hemoglobin 11.1 - 15.9 g/dL 9.8(L) 13.5 13.7  Hematocrit 34.0 - 46.6 % 32.9(L) 41.6 42.1  Platelets 150 - 450 x10E3/uL 272 309 226    Component Ref Range & Units 5 mo ago  (09/28/19) 1 yr ago  (07/30/18) 1 yr ago  (07/30/18) 2 yr ago  (11/27/17) 2 yr ago  (11/27/17) 2 yr ago  (05/27/17) 2 yr ago  (05/27/17)  Total Iron Binding Capacity 250 - 450 ug/dL 446   389 R  354 R    371 R   UIBC 118 - 369 ug/dL 424High   345 R, CM  319 R, CM    334 R, CM   Iron 27 - 139 ug/dL 22Low   44 R  35Low R    38Low R   Iron Saturation 15 - 55 % 5Low Panic         Ferritin 15 - 150 ng/mL 28  94 R, CM            Lipid Panel Recent Labs  09/28/19 0948  CHOL 148  TRIG 130  LDLCALC 86  HDL 39*    HEMOGLOBIN A1C Lab Results  Component Value Date   HGBA1C 6.1 (H) 01/02/2016   MPG 128 01/02/2016   TSH No results for input(s): TSH in the last 8760 hours. Medications and allergies   Allergies  Allergen Reactions   Bee Venom Anaphylaxis   Gabapentin Other (See Comments)    "Bad dreams"   Morphine And Related Other (See Comments)    Overly sedated   Codeine Hives   Adhesive [Tape] Other (See Comments)    Redness, Paper tape is ok   Sulfa Antibiotics Other (See Comments)    Unknown childhood reaction   Sulfonamide Derivatives Other (See Comments)    Unknown childhood reaction     Current Outpatient Medications  Medication Instructions   albuterol (PROVENTIL HFA;VENTOLIN HFA) 108 (90 Base) MCG/ACT inhaler 2 puffs, Inhalation, Every 4 hours PRN   aspirin EC 81 mg, Oral, 2  times daily   atorvastatin (LIPITOR) 20 mg, Oral, Daily   busPIRone (BUSPAR) 5 mg, Oral, 2 times daily   diclofenac Sodium (VOLTAREN) 1 % GEL Topical, Daily PRN   DULoxetine (CYMBALTA) 60 mg, Oral, Daily after breakfast   FERRETTS 325 (106 Fe) MG TABS tablet 1 tablet, Oral, Daily   gemfibrozil (LOPID) 600 mg, Oral, 2 times daily before meals   losartan-hydrochlorothiazide (HYZAAR) 50-12.5 MG tablet 0.5 tablets, Oral, BH-each morning   methocarbamol (ROBAXIN) 500 mg, Oral, Every 6 hours PRN   metoCLOPramide (REGLAN) 5 mg, Oral, 3 times daily before meals, 20 to 30 minutes before meals   nicotine (NICODERM CQ - DOSED IN MG/24 HOURS) 21 mg, Daily   nitroGLYCERIN (NITROSTAT) 0.4 mg, Sublingual, Every 5 min x3 PRN   omeprazole (PRILOSEC) 40 MG capsule omeprazole 40 mg capsule,delayed release   ondansetron (ZOFRAN) 4 MG tablet Daily PRN   OVER THE COUNTER MEDICATION 1 application, Topical, As needed, Magnilife topicall muscle rub   oxyCODONE-acetaminophen (PERCOCET) 7.5-325 MG tablet 1 tablet, Oral, Every 4 hours PRN   rOPINIRole (REQUIP) 1 mg, Oral, 3 times daily   traZODone (DESYREL) 25-50 mg, Oral, At bedtime PRN   Radiology/GI eval:   Upper GI endoscopy 11/19/2019: Esophagus tortuous throughout questionable large caliber distal ring, not dilated due to retained gastric contents. Stomach reveals large volume retained gastric contents and isolated prepyloric erosion.  Duodenum was normal. Impression: 1. Gastroparesis with large volume retained gastric contents. Suspect secondary to narcotics 2. Small prepyloric erosion.  Cardiac Studies:   Peripheral arteriogram 07/16/2016:  Right SFA 95% stenosis distal. Otherwise 3 vessel R/O bilateral lower extremity.  Left renal artery 95% stenosis.   08/06/2016: Patient randomized in [facility] DCB Study: Successful PTA and drug coated balloon angioplasty with a 5.0 x 60 mm  Inpact Admiral balloon of the right mid SFA, stenosis reduced  from 99% to 0%. Mild disease left.  Nuclear stress test  12/29/2017: 1. Lexiscan stress test was performed. Exercise capacity was not assessed. Stress symptoms included headache. Peak effect blood pressure was 172/68 mmHg. The resting and stress electrocardiogram demonstrated normal sinus rhythm, normal resting conduction, no resting arrhythmias and normal rest repolarization. Stress EKG is non diagnostic for ischemia as it is a pharmacologic stress. 2. The overall quality of the study is good. There is no evidence of abnormal lung activity. Stress SPECT images demonstrate homogeneous tracer distribution throughout the myocardium. Decreased tracer uptake in basal inferoseptal myocardium seen only on SPECT rest images likely represents tissue attenuation  artifact. Gated SPECT imaging reveals normal myocardial thickening and wall motion. The left ventricular ejection fraction was normal (68%). 3. Low risk study.  Lower Extremity Arterial Duplex 01/25/2020: No hemodynamically significant stenoses are identified in the right and left lower extremity arterial system.  This exam reveals mildly decreased perfusion of the right lower extremity, noted at the post tibial artery level (ABI 0.83) and moderately decreased perfusion of the left lower extremity, noted at the anterior tibial and post tibial artery level (ABI 0.72).  No significant change from 11/27/2015.  Carotid artery duplex 12/21/2019: Stenosis in the right internal carotid artery (50-69%). Stenosis in the right external carotid artery (<50%). Stenosis in the left internal carotid artery (50-69%). Stenosis in the left external carotid artery (<50%). Antegrade right vertebral artery flow. Antegrade left vertebral artery flow. Compared to 12/23/2018, there is mild progression of bilateral ICA stenosis from <50%. Follow up in six months is appropriate if clinically indicated.  EKG:    EKG 03/03/2020: Normal sinus rhythm with a rate of 92 bpm,  left atrial enlargement, incomplete right bundle branch block.  Poor R wave progression, cannot exclude anteroseptal infarct old.  Low-voltage complexes.  No significant change from 08/23/2019.  Assessment     ICD-10-CM   1. Orthostatic hypotension  I95.1 EKG 12-Lead  2. Asymptomatic bilateral carotid artery stenosis  I65.23   3. Claudication in peripheral vascular disease (HCC)  I73.9   4. Neurogenic claudication due to lumbar spinal stenosis  M48.062   5. Iron deficiency anemia due to chronic blood loss  D50.0 CBC  6. Primary hypertension  I10 losartan-hydrochlorothiazide (HYZAAR) 50-12.5 MG tablet    TSH  7. Mixed hyperlipidemia  E78.2 Lipid Panel With LDL/HDL Ratio    EKG 01/13/2019: Normal sinus rhythm at the rate of 85 bpm, left atrial enlargement, normal axis.  Poor R-wave progression, cannot exclude anteroseptal infarct old.  No evidence of ischemia.  Low-voltage complexes.  Pulmonary disease pattern. No significant change from  EKG 11/17/2017   Recommendations:   No orders of the defined types were placed in this encounter.   ANNELL CANTY  is a 70 y.o. Caucasian female with history of hypertension, hypertriglyceridemia, tobacco use quit in August 2020, prediabetes mellitus, PAD with history of right SFA angioplasty 08/06/2016 and no significant disease in the left leg, pseudoclaudication from spinal stenosis and also history of venous insufficiency being treated conservatively with resolution of leg edema with use of support stockings, chronic iron deficiency anemia with  PUD and gastroparesis felt due to chronic narcotic use presents here for follow-up of PAD,  hyperlipidemia and hypertension.  She has incidental left renal artery stenosis being managed medically.  However when she came for lower extremity arterial duplex on 10/21/2019, she had complained of marked dizziness and was found to be severely orthostatic. Since reducing the dose of amlodipine, symptoms only minimally  improved. She was severely orthostatic today to almost passing out at the bedside. In view of peripheral arterial disease, carotid disease, she probably has intracerebral vascular disease as well, will discontinue amlodipine and also reduce the dose of losartan HCT from 50/12.5 mg to 1/2 tablet daily. Suspect orthostasis could be related to peripheral neuropathy from long-term smoking but also patient is on duloxetine and BuSpar and along with antihypertensive medication can make her orthostatic as well.  She needs routine labs including lipids, TSH CBC and CMP.  If she continues to be severely anemic, we should consider iron transfusion.  Her iron studies were reviewed, she  is severely iron deficient.  With regard to peripheral arterial disease symptoms of claudication, she continues to have marked weakness in her legs especially when she is walking, although cramping has improved, she feels like his legs will give up., I discussed with her that best option is to proceed with angiography however in view of anemia, recent upper GI endoscopy revealing gastric ulcer, until her medical issues are stable, probably best not to proceed with angiography at this time. Continue aspirin alone, avoid Plavix for now. I would like to see her back in 3 months or sooner if problems. However if I do find abnormalities on the blood work, I will be seeing her sooner or making further arrangements.  She still continues to have what appears like early satiety, she may need further GI work-up, depending upon the labs I will make recommendations.  She has moderate bilateral carotid artery disease, she will need continued surveillance. She is still smoking occasionally, again we discussed complete abstinence.  This was a 40 minute office visit encounter in evaluation of external records, external labs, coordination of care and making complex medical decisions.  Adrian Prows, MD, [facility] 03/03/2020, 11:41 AM Office:  [phone] Pager: [phone]

## 2020-03-03 ENCOUNTER — Encounter: Payer: Self-pay | Admitting: Cardiology

## 2020-03-03 ENCOUNTER — Telehealth: Payer: Self-pay

## 2020-03-03 ENCOUNTER — Ambulatory Visit: Payer: Medicare Other | Admitting: Cardiology

## 2020-03-03 ENCOUNTER — Other Ambulatory Visit: Payer: Self-pay

## 2020-03-03 VITALS — BP 115/67 | HR 96 | Temp 98.2°F | Resp 17 | Ht 61.0 in | Wt 182.0 lb

## 2020-03-03 DIAGNOSIS — I951 Orthostatic hypotension: Secondary | ICD-10-CM

## 2020-03-03 DIAGNOSIS — D500 Iron deficiency anemia secondary to blood loss (chronic): Secondary | ICD-10-CM

## 2020-03-03 DIAGNOSIS — E782 Mixed hyperlipidemia: Secondary | ICD-10-CM

## 2020-03-03 DIAGNOSIS — I6523 Occlusion and stenosis of bilateral carotid arteries: Secondary | ICD-10-CM

## 2020-03-03 DIAGNOSIS — I10 Essential (primary) hypertension: Secondary | ICD-10-CM

## 2020-03-03 DIAGNOSIS — I739 Peripheral vascular disease, unspecified: Secondary | ICD-10-CM

## 2020-03-03 DIAGNOSIS — M48062 Spinal stenosis, lumbar region with neurogenic claudication: Secondary | ICD-10-CM

## 2020-03-03 NOTE — Telephone Encounter (Signed)
Once the labs are available, I will evaluate and see if she needs iron infusion.  Right now she does not need to do anything.  I am aware of her CT scan report, nothing suggestive of cancer right now so they want to repeat it again in 3 months.

## 2020-03-03 NOTE — Telephone Encounter (Signed)
Pt was here this morning and called stating she forgot to tell you that she had an upper lung ct 12/20/2019. They had told her that she had something on her lungs and that she had to wait 3 months to get another ct done. Alvester Chou is the one who did the CT but she is unsure if she should be worried about the results or not. Her next ct is next month. She is also unsure of your instructions with an infusion from today? She is not sure where to go to have it done.

## 2020-03-03 NOTE — Telephone Encounter (Signed)
Called and spoke with pt regarding CT and iron infusion questions. Pt voiced understanding.

## 2020-03-09 LAB — LIPID PANEL WITH LDL/HDL RATIO
Cholesterol, Total: 182 mg/dL (ref 100–199)
HDL: 42 mg/dL (ref >39)
LDL Chol Calc (NIH): 110 mg/dL — ABNORMAL HIGH (ref 0–99)
LDL/HDL Ratio: 2.6 ratio (ref 0.0–3.2)
Triglycerides: 170 mg/dL — ABNORMAL HIGH (ref 0–149)
VLDL Cholesterol Cal: 30 mg/dL (ref 5–40)

## 2020-03-09 LAB — CBC
Hematocrit: 42.4 % (ref 34.0–46.6)
Hemoglobin: 14.1 g/dL (ref 11.1–15.9)
MCH: 27.8 pg (ref 26.6–33.0)
MCHC: 33.3 g/dL (ref 31.5–35.7)
MCV: 84 fL (ref 79–97)
Platelets: 289 10*3/uL (ref 150–450)
RBC: 5.08 x10E6/uL (ref 3.77–5.28)
RDW: 14.3 % (ref 11.7–15.4)
WBC: 5.3 10*3/uL (ref 3.4–10.8)

## 2020-03-09 LAB — TSH: TSH: 2.36 u[IU]/mL (ref 0.450–4.500)

## 2020-03-12 NOTE — Progress Notes (Signed)
She would be a good candidate for Horizon.  [name] let patient know the hemoglobin is now completely normal. She will need to add Zetia (generic name ezetimibe) to be added to reduce her cholesterol. If willing Rx 90 day with 3 refills after supper

## 2020-03-13 ENCOUNTER — Telehealth: Payer: Self-pay

## 2020-03-13 NOTE — Telephone Encounter (Signed)
error 

## 2020-03-13 NOTE — Telephone Encounter (Signed)
She has PAD and angioplasty to her legs

## 2020-03-15 ENCOUNTER — Other Ambulatory Visit: Payer: Self-pay | Admitting: Adult Health

## 2020-03-15 DIAGNOSIS — R911 Solitary pulmonary nodule: Secondary | ICD-10-CM

## 2020-03-15 NOTE — Telephone Encounter (Signed)
Subject called and not interested.  She does not like the fact that it would be an injection.

## 2020-03-20 NOTE — Progress Notes (Signed)
Called and spoke with patient regarding her lab results. Patient knows I will be sending Zetia to her pharmacy.

## 2020-03-29 ENCOUNTER — Encounter: Payer: Self-pay | Admitting: Cardiology

## 2020-03-30 ENCOUNTER — Ambulatory Visit
Admission: RE | Admit: 2020-03-30 | Discharge: 2020-03-30 | Disposition: A | Discharge status: Home / Self Care | Payer: Medicare Other | Source: Ambulatory Visit | Attending: Adult Health | Admitting: Adult Health

## 2020-03-30 DIAGNOSIS — R911 Solitary pulmonary nodule: Secondary | ICD-10-CM

## 2020-04-14 ENCOUNTER — Ambulatory Visit: Payer: Medicare Other | Admitting: Adult Health

## 2020-04-14 ENCOUNTER — Other Ambulatory Visit: Payer: Self-pay

## 2020-04-14 ENCOUNTER — Telehealth: Payer: Self-pay | Admitting: Adult Health

## 2020-04-14 ENCOUNTER — Encounter: Payer: Self-pay | Admitting: Adult Health

## 2020-04-14 VITALS — BP 147/73 | HR 110 | Ht 61.0 in | Wt 193.0 lb

## 2020-04-14 DIAGNOSIS — L900 Lichen sclerosus et atrophicus: Secondary | ICD-10-CM

## 2020-04-14 MED ORDER — CLOBETASOL PROPIONATE 0.05 % EX OINT: 1.0000 "application " | TOPICAL_OINTMENT | Freq: Two times a day (BID) | CUTANEOUS | 3 refills | Status: DC

## 2020-04-14 NOTE — Telephone Encounter (Signed)
ERROR

## 2020-04-14 NOTE — Progress Notes (Signed)
Patient ID: [name], female   DOB: 24-Feb-1950, 70 y.o.   MRN: [id number] History of Present Illness: [name] is a 70 year old white female, married, sp hysterectomy, in complaining of rash in private area with itching for 6 months, has tried the pill and vagisil, without relief.  PCP is Alvester Chou NP.    Current Medications, Allergies, Past Medical History, Past Surgical History, Family History and Social History were reviewed in [employer] record.     Review of Systems: +rash and itching in "private area" Reviewed past medical,surgical, social and family history. Reviewed medications and allergies.     Physical Exam:BP (!) 147/73 (BP Location: Left Arm, Patient Position: Sitting, Cuff Size: Normal)   Pulse (!) 110   Ht 5\' 1"  (1.549 m)   Wt 193 lb (87.5 kg)   BMI 36.47 kg/m  General:  Well developed, well nourished, no acute distress Skin:  Warm and dry Lungs; Clear to auscultation bilaterally Cardiovascular: Regular rate and rhythm Pelvic:  External genitalia is normal in appearance, has thinning of inner labia and clitorus area and at introitus.  The vagina is pale with loss of moisture and rugae. Urethra has no lesions or masses. The cervix and uterus are absent.  No adnexal masses or tenderness noted.Bladder is non tender, no masses felt. Psych:  No mood changes, alert and cooperative,seems happy AA is 0 PHQ 9 score is 11 GAD 7 score is 10  [facility] - 04/14/20 1242      Pregnancy Intention Screening   Does the patient want to become pregnant in the next year? N/A    Does the patient's partner want to become pregnant in the next year? N/A    Would the patient like to discuss contraceptive options today? N/A      Contraception Wrap Up   Current Method No Method - Other Reason   hyst   End Method No Method - Other Reason   Hyst   Contraception Counseling Provided No         Examination chaperoned by Tish RN  Impression and Plan: 1.  Lichen sclerosus et atrophicus Showed picture in [facility] and discussed LSA with her, it is chronic  Will rx temovate, use bid for 2 weeks then 2-3 x weekly Meds ordered this encounter  Medications  . clobetasol ointment (TEMOVATE) 0.05 %    Sig: Apply 1 application topically 2 (two) times daily.    Dispense:  30 g    Refill:  3    Order Specific Question:   Supervising Provider    Answer:   Tania Ade H [[id number]]  Review handout on Lichen Sclerosus  Follow up with me in 6 weeks

## 2020-04-14 NOTE — Patient Instructions (Addendum)
Lichen Sclerosus Lichen sclerosus is a skin problem. It can happen on any part of the body, but it commonly involves the anal and genital areas. It can cause itching and discomfort in these areas. Treatment can help to control symptoms. When the genital area is affected, getting treatment is important because the condition can cause scarring that may lead to other problems if left untreated. What are the causes? The cause of this condition is not known. It may be related to an overactive immune system or a lack of certain hormones. Lichen sclerosus is not an infection or a fungus, and it is not passed from one person to another (non-contagious). What increases the risk? The following factors may make you more likely to develop this condition:  You are a woman who has reached menopause.  You are a man who was not circumcised. This condition may also develop for the first time in children, usually before they enter puberty. What are the signs or symptoms? Symptoms of this condition include:  White areas (plaques) on the skin that may be thin and wrinkled, or thickened.  Red and swollen patches (lesions) on the skin.  Tears or cracks in the skin.  Bruising.  Blood blisters.  Severe itching.  Pain, itching, or burning when urinating. Constipation is also common in children with lichen sclerosus, but can be seen in adults.   How is this diagnosed? This condition may be diagnosed with a physical exam. In some cases, a tissue sample may be removed to be checked under a microscope (biopsy). How is this treated? This condition may be treated with:  Topical steroids. These are medicated creams or ointments that are applied over the affected areas.  Medicines that are taken by mouth.  Topical immunotherapy. These are medicated creams or ointments that are applied over the affected areas. They stimulate your immune system to fight the skin condition. This may be used if steroids are not  effective.  Surgery. This may be needed in more severe cases that are causing problems such as scarring. Follow these instructions at home: Medicines  Take over-the-counter and prescription medicines only as told by your health care provider.  Use creams or ointments as told by your health care provider. Skin care  Do not scratch the affected areas of skin.  If you are a woman, be sure to keep the vaginal area as clean and dry as possible.  Clean the affected area of skin gently with water only. Pat skin dry and avoid the use of rough towels or toilet paper.  Avoid irritating skin products, including soap and scented lotions. Use emollient creams as directed by your health care provider to help reduce itching. General instructions  Keep all follow-up visits. This is important.  Your condition may cause constipation. To prevent or treat constipation, you may need to: ? Drink enough fluid to keep your urine pale yellow. ? Take over-the-counter or prescription medicines. ? Eat foods that are high in fiber, such as beans, whole grains, and fresh fruits and vegetables. ? Limit foods that are high in fat and processed sugars, such as fried or sweet foods. Contact a health care provider if:  You have increasing redness, swelling, or pain in the affected area.  You have fluid, blood, or pus coming from the affected area.  You have new lesions on your skin.  You have a fever.  You have pain during sex. Get help right away if:  You develop severe pain or burning in  the affected areas, especially in the genital area. Summary  Lichen sclerosus is a skin problem. When the genital area is affected, getting treatment is important because the condition can cause scarring that may lead to other problems if left untreated.  This condition is usually treated with medicated creams or ointments (topical steroids) that are applied over the affected areas.  Take or use over-the-counter and  prescription medicines only as told by your health care provider.  Contact a health care provider if you have new lesions on your skin, have pain during sex, or have increasing redness, swelling, or pain in the affected area.  Keep all follow-up visits. This is important. This information is not intended to replace advice given to you by your health care provider. Make sure you discuss any questions you have with your health care provider. Document Revised: 05/15/2019 Document Reviewed: 05/15/2019 Elsevier Patient Education  [address] [place].  Lichen Sclerosus Lichen sclerosus is a skin problem. It can happen on any part of the body, but it commonly involves the anal and genital areas. It can cause itching and discomfort in these areas. Treatment can help to control symptoms. When the genital area is affected, getting treatment is important because the condition can cause scarring that may lead to other problems if left untreated. What are the causes? The cause of this condition is not known. It may be related to an overactive immune system or a lack of certain hormones. Lichen sclerosus is not an infection or a fungus, and it is not passed from one person to another (non-contagious). What increases the risk? The following factors may make you more likely to develop this condition:  You are a woman who has reached menopause.  You are a man who was not circumcised. This condition may also develop for the first time in children, usually before they enter puberty. What are the signs or symptoms? Symptoms of this condition include:  White areas (plaques) on the skin that may be thin and wrinkled, or thickened.  Red and swollen patches (lesions) on the skin.  Tears or cracks in the skin.  Bruising.  Blood blisters.  Severe itching.  Pain, itching, or burning when urinating. Constipation is also common in children with lichen sclerosus, but can be seen in adults.   How is this  diagnosed? This condition may be diagnosed with a physical exam. In some cases, a tissue sample may be removed to be checked under a microscope (biopsy). How is this treated? This condition may be treated with:  Topical steroids. These are medicated creams or ointments that are applied over the affected areas.  Medicines that are taken by mouth.  Topical immunotherapy. These are medicated creams or ointments that are applied over the affected areas. They stimulate your immune system to fight the skin condition. This may be used if steroids are not effective.  Surgery. This may be needed in more severe cases that are causing problems such as scarring. Follow these instructions at home: Medicines  Take over-the-counter and prescription medicines only as told by your health care provider.  Use creams or ointments as told by your health care provider. Skin care  Do not scratch the affected areas of skin.  If you are a woman, be sure to keep the vaginal area as clean and dry as possible.  Clean the affected area of skin gently with water only. Pat skin dry and avoid the use of rough towels or toilet paper.  Avoid irritating skin  products, including soap and scented lotions. Use emollient creams as directed by your health care provider to help reduce itching. General instructions  Keep all follow-up visits. This is important.  Your condition may cause constipation. To prevent or treat constipation, you may need to: ? Drink enough fluid to keep your urine pale yellow. ? Take over-the-counter or prescription medicines. ? Eat foods that are high in fiber, such as beans, whole grains, and fresh fruits and vegetables. ? Limit foods that are high in fat and processed sugars, such as fried or sweet foods. Contact a health care provider if:  You have increasing redness, swelling, or pain in the affected area.  You have fluid, blood, or pus coming from the affected area.  You have new  lesions on your skin.  You have a fever.  You have pain during sex. Get help right away if:  You develop severe pain or burning in the affected areas, especially in the genital area. Summary  Lichen sclerosus is a skin problem. When the genital area is affected, getting treatment is important because the condition can cause scarring that may lead to other problems if left untreated.  This condition is usually treated with medicated creams or ointments (topical steroids) that are applied over the affected areas.  Take or use over-the-counter and prescription medicines only as told by your health care provider.  Contact a health care provider if you have new lesions on your skin, have pain during sex, or have increasing redness, swelling, or pain in the affected area.  Keep all follow-up visits. This is important. This information is not intended to replace advice given to you by your health care provider. Make sure you discuss any questions you have with your health care provider. Document Revised: 05/15/2019 Document Reviewed: 05/15/2019 Elsevier Patient Education  [place].

## 2020-05-18 ENCOUNTER — Telehealth: Payer: Self-pay | Admitting: Adult Health

## 2020-05-18 NOTE — Telephone Encounter (Signed)
Pt called to cancel her appt for 05/19/20 & wanted to let Anderson Malta know that "everything is OK"

## 2020-05-22 ENCOUNTER — Emergency Department (HOSPITAL_COMMUNITY): Payer: Medicare Other

## 2020-05-22 ENCOUNTER — Other Ambulatory Visit: Payer: Self-pay

## 2020-05-22 ENCOUNTER — Emergency Department (HOSPITAL_COMMUNITY)
Admission: EM | Admit: 2020-05-22 | Discharge: 2020-05-22 | Disposition: A | Discharge status: Home / Self Care | Payer: Medicare Other | Attending: Emergency Medicine | Admitting: Emergency Medicine

## 2020-05-22 DIAGNOSIS — I10 Essential (primary) hypertension: Secondary | ICD-10-CM | POA: Diagnosis not present

## 2020-05-22 DIAGNOSIS — S92351A Displaced fracture of fifth metatarsal bone, right foot, initial encounter for closed fracture: Secondary | ICD-10-CM | POA: Insufficient documentation

## 2020-05-22 DIAGNOSIS — S92341A Displaced fracture of fourth metatarsal bone, right foot, initial encounter for closed fracture: Secondary | ICD-10-CM | POA: Insufficient documentation

## 2020-05-22 DIAGNOSIS — Y9389 Activity, other specified: Secondary | ICD-10-CM | POA: Insufficient documentation

## 2020-05-22 DIAGNOSIS — Z96642 Presence of left artificial hip joint: Secondary | ICD-10-CM | POA: Insufficient documentation

## 2020-05-22 DIAGNOSIS — Z79899 Other long term (current) drug therapy: Secondary | ICD-10-CM | POA: Insufficient documentation

## 2020-05-22 DIAGNOSIS — Z8541 Personal history of malignant neoplasm of cervix uteri: Secondary | ICD-10-CM | POA: Insufficient documentation

## 2020-05-22 DIAGNOSIS — S99922A Unspecified injury of left foot, initial encounter: Secondary | ICD-10-CM | POA: Diagnosis present

## 2020-05-22 DIAGNOSIS — W010XXA Fall on same level from slipping, tripping and stumbling without subsequent striking against object, initial encounter: Secondary | ICD-10-CM | POA: Diagnosis not present

## 2020-05-22 DIAGNOSIS — F17210 Nicotine dependence, cigarettes, uncomplicated: Secondary | ICD-10-CM | POA: Diagnosis not present

## 2020-05-22 DIAGNOSIS — S92302A Fracture of unspecified metatarsal bone(s), left foot, initial encounter for closed fracture: Secondary | ICD-10-CM

## 2020-05-22 DIAGNOSIS — Z96651 Presence of right artificial knee joint: Secondary | ICD-10-CM | POA: Diagnosis not present

## 2020-05-22 DIAGNOSIS — J449 Chronic obstructive pulmonary disease, unspecified: Secondary | ICD-10-CM | POA: Insufficient documentation

## 2020-05-22 DIAGNOSIS — Z7982 Long term (current) use of aspirin: Secondary | ICD-10-CM | POA: Insufficient documentation

## 2020-05-22 MED ORDER — HYDROMORPHONE HCL 1 MG/ML IJ SOLN
1.0000 mg | Freq: Once | INTRAMUSCULAR | Status: AC
  Administered 2020-05-22: 1 mg via INTRAMUSCULAR
  Filled 2020-05-22: qty 1

## 2020-05-22 NOTE — Progress Notes (Signed)
Orthopedic Tech Progress Note Patient Details:  [name] 01/20/1950 [id number]  Ortho Devices Type of Ortho Device: CAM walker Ortho Device/Splint Location: lle Ortho Device/Splint Interventions: Ordered,Application,Adjustment   Post Interventions Patient Tolerated: Well Instructions Provided: Care of device,Adjustment of device   Karolee Stamps 05/22/2020, 10:31 PM

## 2020-05-22 NOTE — ED Provider Notes (Signed)
[name] EMERGENCY DEPARTMENT Provider Note   CSN: [id number] Arrival date & time: 05/22/20  1830     History Chief Complaint  Patient presents with  . Fall    Pt c/o L foot pain after a short fall. Pt denies any LOC. Not on any blood thinners. Pt also c/o R hand 5th digit pain    [name] is a 70 y.o. female.  Presents to ER after foot pain.  States that she twisted her foot, having severe pain in her foot, midfoot, radiates up her left leg.  No numbness or weakness.  Has been able to bear weight albeit with some pain.  Denies loss of consciousness, denies any other injuries.  HPI     Past Medical History:  Diagnosis Date  . Anxiety   . Aortic atherosclerosis ([place])   . Bilateral cataracts   . Carpal tunnel syndrome   . Cervical cancer ([place])   . Chronic back pain   . Claudication ([place])   . COPD (chronic obstructive pulmonary disease) ([facility])    wheezing  . DDD (degenerative disc disease), cervical   . DDD (degenerative disc disease), lumbar   . Depression   . Diverticulosis   . Gastroparesis   . GERD (gastroesophageal reflux disease)   . Hearing loss    Bilateral  . History of blood transfusion   . History of colon polyps   . History of migraine   . Hypertension   . Internal hemorrhoids   . Iron deficiency anemia   . Left-sided weakness   . Leg swelling   . Liver disease    pt unaware  . Lung nodule    several small  . OA (osteoarthritis)    both hands  . Positive ANA (antinuclear antibody)   . Pre-diabetes   . Raynaud's phenomenon   . Restless leg   . Seizures ([place])    no meds for 8 months  . Sleep apnea    does not use her cpap  . Spondylosis   . Stroke [facility]) 2015   no weakness  . 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   rt  . Urge incontinence of urine   . Varicose vein of leg   . Venous insufficiency     Patient Active Problem List   Diagnosis Date Noted  . Lichen sclerosus et atrophicus 04/14/2020  . DOE (dyspnea on  exertion) 12/10/2017  . Upper airway cough syndrome 12/10/2017  . Morbid obesity due to excess calories ([place]) complicted by hbp/osa / djd and prob diastolic dysfunction  25/85/2778  . Primary osteoarthritis of left hip 10/13/2017  . 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 Positive ANA (antinuclear antibody) 02/06/2017  . Raynaud's phenomenon without gangrene 02/06/2017  . Primary osteoarthritis of both hands 02/06/2017  . DDD (degenerative disc disease), cervical 02/06/2017  . DDD (degenerative disc disease), lumbar 02/06/2017  . History of left hip replacement 02/06/2017  . Neck pain on left side 08/05/2016  . Claudication in peripheral vascular disease ([place]) 07/14/2016  . H/O total knee replacement, right 01/12/2016  . Pain in joint, lower leg 05/09/2014  . Convulsion ([place]) 01/17/2014  . Left-sided weakness 01/17/2014  . 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 Left hand weakness 08/24/2012  . CERVICAL CANCER 04/03/2007  . COLONIC POLYPS 04/03/2007  . OVERWEIGHT 04/03/2007  . ANXIETY DEPRESSION 04/03/2007  . Cigarette smoker 04/03/2007  . Essential hypertension 04/03/2007  . INTERNAL HEMORRHOIDS 04/03/2007  . GERD 04/03/2007  . Gastroparesis  04/03/2007  . Diverticulosis of colon 04/03/2007  . BACK PAIN, CHRONIC 04/03/2007  . CHEST PAIN, ATYPICAL 04/03/2007    Past Surgical History:  Procedure Laterality Date  . BACK SURGERY    . BLEPHAROPLASTY  10/10/2017  . CATARACT EXTRACTION, BILATERAL    . CESAREAN SECTION    . CHOLECYSTECTOMY    . COLONOSCOPY  10/2016  . HAND SURGERY Bilateral    carpal tunnel  . HEMORRHOID SURGERY    . INTERSTIM IMPLANT PLACEMENT    . KNEE SURGERY Left    arthroscopy x3  . LOWER EXTREMITY ANGIOGRAPHY N/A 07/16/2016   Procedure: Lower Extremity Angiography;  Surgeon: Adrian Prows, MD;  Location: [facility];  Service: Cardiovascular;  Laterality: N/A;  . LOWER EXTREMITY INTERVENTION N/A 08/06/2016   Procedure: Lower Extremity  Intervention;  Surgeon: Adrian Prows, MD;  Location: [facility];  Service: Cardiovascular;  Laterality: N/A;  . NECK SURGERY    . OTHER SURGICAL HISTORY  2013   bladder stimulator in back  . PERIPHERAL VASCULAR BALLOON ANGIOPLASTY  08/06/2016   Procedure: Peripheral Vascular Balloon Angioplasty;  Surgeon: Adrian Prows, MD;  Location: [facility];  Service: Cardiovascular;;  Right SFA  . TENNIS ELBOW RELEASE/NIRSCHEL PROCEDURE Right 12/2015  . TOE SURGERY Right    right foot second and fourth toe  . TOTAL HIP ARTHROPLASTY Left   . TOTAL HIP REVISION Left 10/13/2017   Procedure: LEFT TOTAL HIP REVISION ACETABULUM, OPEN REDUCTION INTERNAL WITH BONE GRAFT FIXATION LEFT GREATER TROCHANTERIC FRACTURE;  Surgeon: Frederik Pear, MD;  Location: [facility];  Service: Orthopedics;  Laterality: Left;  . TOTAL KNEE ARTHROPLASTY Right 01/12/2016   Procedure: RIGHT TOTAL KNEE ARTHROPLASTY;  Surgeon: Netta Cedars, MD;  Location: [facility];  Service: Orthopedics;  Laterality: Right;  . TUBAL LIGATION    . UPPER GI ENDOSCOPY  10/2016  . VAGINAL HYSTERECTOMY       OB History    Gravida  1   Para  1   Term      Preterm      AB      Living  1     SAB      IAB      Ectopic      Multiple      Live Births              Family History  Problem Relation Age of Onset  . Hyperlipidemia Mother   . Hypertension Mother   . Anxiety disorder Mother   . Depression Mother   . Hyperlipidemia Father   . Hypertension Father   . Alzheimer's disease Father   . Anxiety disorder Brother   . Cancer Daughter        unknown  . Anxiety disorder Brother   . Anxiety disorder Brother   . Drug abuse Brother   . Stroke Brother   . Colon cancer Neg Hx   . Esophageal cancer Neg Hx   . Rectal cancer Neg Hx   . Stomach cancer Neg Hx     Social History   Tobacco Use  . Smoking status: Current Every Day Smoker    Packs/day: 1.00    Years: 55.00    Pack years: 55.00    Types: Cigarettes  .  Smokeless tobacco: Never Used  Vaping Use  . Vaping Use: Never used  Substance Use Topics  . Alcohol use: No    Alcohol/week: 0.0 standard drinks  . Drug use: Not Currently  Home Medications Prior to Admission medications   Medication Sig Start Date End Date Taking? Authorizing Provider  albuterol (PROVENTIL HFA;VENTOLIN HFA) 108 (90 Base) MCG/ACT inhaler Inhale 2 puffs into the lungs every 4 (four) hours as needed for wheezing or shortness of breath (cough). 12/25/17   Tanda Rockers, MD  aspirin EC 81 MG tablet Take 1 tablet (81 mg total) by mouth 2 (two) times daily. Patient taking differently: Take 81 mg by mouth daily. 10/13/17   Leighton Parody, PA-C  atorvastatin (LIPITOR) 20 MG tablet Take 20 mg by mouth daily. Patient not taking: Reported on 04/14/2020 02/01/19   [provider]  busPIRone (BUSPAR) 5 MG tablet Take 1 tablet (5 mg total) by mouth 2 (two) times daily. 02/18/18   Ursula Alert, MD  clobetasol ointment (TEMOVATE) 2.83 % Apply 1 application topically 2 (two) times daily. 04/14/20   Estill Dooms, NP  diclofenac Sodium (VOLTAREN) 1 % GEL Apply topically daily as needed. Patient not taking: Reported on 04/14/2020 03/02/20   [provider]  DULoxetine (CYMBALTA) 60 MG capsule Take 60 mg by mouth daily after breakfast.     [provider]  FERRETTS 325 (106 Fe) MG TABS tablet Take 1 tablet by mouth daily. 11/18/19   [provider]  gemfibrozil (LOPID) 600 MG tablet Take 600 mg by mouth 2 (two) times daily before a meal. Patient not taking: Reported on 04/14/2020    [provider]  losartan-hydrochlorothiazide (HYZAAR) 50-12.5 MG tablet Take 0.5 tablets by mouth every morning. 03/03/20   Adrian Prows, MD  methocarbamol (ROBAXIN) 500 MG tablet Take 1 tablet (500 mg total) by mouth every 6 (six) hours as needed for muscle spasms. Patient taking differently: Take 500 mg by mouth See admin instructions. Take one tablet (500 mg) by  mouth daily at bedtime, may also take one tablet (500 mg) during the day as needed for muscle spasms 10/13/17   Leighton Parody, PA-C  metoCLOPramide (REGLAN) 5 MG tablet Take 1 tablet (5 mg total) by mouth 3 (three) times daily before meals. 20 to 30 minutes before meals 10/26/19   Levin Erp, PA  nicotine (NICODERM CQ - DOSED IN MG/24 HOURS) 21 mg/24hr patch 21 mg daily. Patient not taking: Reported on 04/14/2020 10/29/19   [provider]  nitroGLYCERIN (NITROSTAT) 0.4 MG SL tablet Place 0.4 mg under the tongue every 5 (five) minutes x 3 doses as needed for chest pain. Patient not taking: Reported on 04/14/2020 05/17/16   [provider]  omeprazole (PRILOSEC) 40 MG capsule omeprazole 40 mg capsule,delayed release Patient not taking: Reported on 04/14/2020    [provider]  ondansetron (ZOFRAN) 4 MG tablet daily as needed. Patient not taking: Reported on 04/14/2020    [provider]  OVER THE COUNTER MEDICATION Apply 1 application topically as needed (joint pain). Magnilife topicall muscle rub  Patient not taking: Reported on 04/14/2020    [provider]  oxyCODONE-acetaminophen (PERCOCET) 7.5-325 MG tablet Take 1 tablet by mouth every 4 (four) hours as needed for severe pain. 10/13/17   Leighton Parody, PA-C  rOPINIRole (REQUIP) 1 MG tablet Take 1 mg by mouth 3 (three) times daily.     [provider]  traZODone (DESYREL) 50 MG tablet TAKE 0.5-1 TABLETS (25-50 MG TOTAL) BY MOUTH AT BEDTIME AS NEEDED FOR SLEEP. 03/16/18   Ursula Alert, MD    Allergies    Bee venom, Gabapentin, Morphine and related, Codeine, Adhesive [tape], Sulfa  antibiotics, and Sulfonamide derivatives  Review of Systems   Review of Systems  Musculoskeletal: Positive for arthralgias.  All other systems reviewed and are negative.   Physical Exam Updated Vital Signs BP 137/79 (BP Location: Right Arm)   Pulse 90   Resp 16   SpO2 98%   Physical Exam Vitals  and nursing note reviewed.  Constitutional:      General: She is not in acute distress.    Appearance: She is well-developed.  HENT:     Head: Normocephalic and atraumatic.  Eyes:     Conjunctiva/sclera: Conjunctivae normal.  Cardiovascular:     Rate and Rhythm: Normal rate.     Pulses: Normal pulses.  Pulmonary:     Effort: Pulmonary effort is normal. No respiratory distress.  Musculoskeletal:     Cervical back: Neck supple.     Comments: Left Lower extremity: There is tenderness to palpation over midfoot, lower leg, normal DP/PT pulse, normal sensation and distal cap refill  Skin:    General: Skin is warm and dry.  Neurological:     Mental Status: She is alert.  Psychiatric:        Mood and Affect: Mood normal.     ED Results / Procedures / Treatments   Labs (all labs ordered are listed, but only abnormal results are displayed) Labs Reviewed - No data to display  EKG None  Radiology DG Tibia/Fibula Left  Result Date: 05/22/2020 CLINICAL DATA:  Status post fall. EXAM: LEFT TIBIA AND FIBULA - 2 VIEW COMPARISON:  None. FINDINGS: Chronic fracture deformities are seen involving the visualized portions of the fourth and fifth left metatarsals. A chronic deformity is seen involving the proximal left fibula. There is no evidence of dislocation. Mild vascular calcification is noted. Soft tissues are otherwise unremarkable. IMPRESSION: Acute fractures of the fourth and fifth left metatarsals. Electronically Signed   By: Virgina Norfolk M.D.   On: 05/22/2020 19:52   DG Foot Complete Left  Result Date: 05/22/2020 CLINICAL DATA:  Status post fall. EXAM: LEFT FOOT - COMPLETE 3+ VIEW COMPARISON:  None. FINDINGS: Acute fractures are seen involving the mid and distal portions of the fourth and fifth left metatarsals. There is no evidence of dislocation. Mild chronic changes are seen along the dorsal aspect of the mid left foot. Moderate severity soft tissue swelling is seen surrounding the  previously noted fracture sites. IMPRESSION: Acute fractures of the fourth and fifth left metatarsals. Electronically Signed   By: Virgina Norfolk M.D.   On: 05/22/2020 19:53    Procedures Procedures   Medications Ordered in ED Medications  HYDROmorphone (DILAUDID) injection 1 mg (1 mg Intramuscular Given 05/22/20 1909)    ED Course  I have reviewed the triage vital signs and the nursing notes.  Pertinent labs & imaging results that were available during my care of the patient were reviewed by me and considered in my medical decision making (see chart for details).    MDM Rules/Calculators/A&P                         70 year old lady presenting to ER with concern for left foot injury.  Found to have fracture of the fourth and fifth left metatarsals.  Neurovascularly intact.  Will place in cam walker boot.  Patient has walker at home, recommended using walker or knee scooter for mobility for now.  Recommend close follow-up with Ortho for further management.  [facility],  recommended calling them tomorrow morning to get a close follow-up appointment.   After the discussed management above, the patient was determined to be safe for discharge.  The patient was in agreement with this plan and all questions regarding their care were answered.  ED return precautions were discussed and the patient will return to the ED with any significant worsening of condition.   Final Clinical Impression(s) / ED Diagnoses Final diagnoses:  Closed fracture of metatarsal bone of left foot, physeal involvement unspecified, unspecified metatarsal, initial encounter    Rx / DC Orders ED Discharge Orders    None       Lucrezia Starch, MD 05/23/20 [phone]

## 2020-05-22 NOTE — Discharge Instructions (Signed)
Please follow-up with [facility] for your foot fracture.  Recommend using the walking boot.  Recommend using your walker for support and mobility.  Would also recommend trying a knee scooter.  Take Tylenol or Motrin for pain control.  For breakthrough pain take your prescribed Percocet.  Note this can make you drowsy do not be taken when driving or operating heavy machinery.

## 2020-05-24 DIAGNOSIS — F17200 Nicotine dependence, unspecified, uncomplicated: Secondary | ICD-10-CM | POA: Insufficient documentation

## 2020-05-26 ENCOUNTER — Ambulatory Visit: Payer: Medicare Other | Admitting: Adult Health

## 2020-05-31 ENCOUNTER — Ambulatory Visit: Payer: Medicare Other | Admitting: Cardiology

## 2020-06-15 ENCOUNTER — Other Ambulatory Visit: Payer: Self-pay | Admitting: Physician Assistant

## 2020-06-28 ENCOUNTER — Telehealth: Payer: Self-pay | Admitting: Physician Assistant

## 2020-06-28 NOTE — Telephone Encounter (Signed)
Returned patient call and answered all questions she had. Patient was confused on if she could take pantoprazole and Reglan at the same time. I also scheduled a follow up appointment because patient stated she was still having GERD and did not schedule her 2 month follow up as discussed with Anderson Malta.

## 2020-06-28 NOTE — Telephone Encounter (Signed)
Inbound call from pt requesting a call back stating that she doesn't know which meds to take or if she should take both of the Reglan and/or Prilosec. Please advise. Thanks

## 2020-07-05 ENCOUNTER — Other Ambulatory Visit: Payer: Medicare Other

## 2020-07-19 ENCOUNTER — Ambulatory Visit: Payer: Medicare Other

## 2020-07-19 ENCOUNTER — Other Ambulatory Visit: Payer: Self-pay

## 2020-07-19 DIAGNOSIS — I6523 Occlusion and stenosis of bilateral carotid arteries: Secondary | ICD-10-CM

## 2020-07-24 ENCOUNTER — Telehealth: Payer: Self-pay | Admitting: Physician Assistant

## 2020-07-24 DIAGNOSIS — R197 Diarrhea, unspecified: Secondary | ICD-10-CM

## 2020-07-24 NOTE — Telephone Encounter (Signed)
Lm on vm for patient to return call 

## 2020-07-24 NOTE — Telephone Encounter (Signed)
Inbound call from patient stating she has been experiencing diarrhea for the past week and is wanting to know if something can be prescribed for it.  Please advise.

## 2020-07-26 NOTE — Telephone Encounter (Signed)
Spoke with patient, she reports diarrhea for the past week. Patient reports that when the diarrhea first started she had a fever and chills. She states that she has ben taking Imodium for the past week with not much relief. She states that the diarrhea slowed down yesterday but started up again last night. Patient states that it seems like everything she eats turns into water. She describes stools as "watery diarrhea", she reports 3 episodes today so far. She states that she also had indigestion and a sour stomach when her symptoms started. She states that she is currently taking Protonix 40 mg BID. Pt is currently scheduled for a follow up with Anderson Malta on 8/11. Please advise, thanks.

## 2020-07-26 NOTE — Telephone Encounter (Signed)
Left detailed vm for patient with recommendations as outlined below. I provided patient with the lab information and hours. Advised patient to give [place] a call back if she has any questions.   Lab order in epic.

## 2020-07-26 NOTE — Telephone Encounter (Signed)
1.  I have her submit stools for GI pathogen panel.  We can see if there is anything that require specific treatment.  Most of the time, acute diarrheal illnesses just require supportive care and time. 2.  Keep plenty hydrated 3.  Okay to use up to 4 Imodium per day 4.  Try Pepto-Bismol for sour stomach

## 2020-07-27 NOTE — Progress Notes (Signed)
Carotid artery duplex 07/19/2020: Duplex suggests stenosis in the right internal carotid artery (16-49%). Duplex suggests stenosis in the right external carotid artery (<50%). Duplex suggests stenosis in the left internal carotid artery (16-49%). Antegrade right vertebral artery flow. Antegrade left vertebral artery flow. Compared to the study done on 12/21/2019, mild decrease in stenosis severity bilaterally.  However compared to prior exam 12/23/2018, no significant change. Follow up in one year is appropriate if clinically indicated.  There is improvement in the stenosis both carotids (probably because she quit smoking - Congrats!!!  Will recheck in 1 year

## 2020-07-27 NOTE — Progress Notes (Signed)
Called patient, NA, LMAM

## 2020-07-28 NOTE — Progress Notes (Signed)
2nd attempt : Called patient, NA, LMAM

## 2020-07-28 NOTE — Progress Notes (Signed)
3rd attempt : Called patient, NA, LMAM

## 2020-08-24 ENCOUNTER — Ambulatory Visit: Payer: Medicare Other | Admitting: Physician Assistant

## 2020-10-03 ENCOUNTER — Encounter: Payer: Self-pay | Admitting: *Deleted

## 2020-10-03 DIAGNOSIS — Z006 Encounter for examination for normal comparison and control in clinical research program: Secondary | ICD-10-CM

## 2020-10-25 NOTE — Research (Signed)
Late Entry:  68M Patent doing well, no shortness of breath or chest pains per patient.  Reviewed meds with patient over the phone  Updated the med list with what patient is taking and not taking.    Current Outpatient Medications:    aspirin 325 MG EC tablet, aspirin 325 mg tablet,delayed release  TAKE 1 TABLET BY MOUTH EVERY DAY, Disp: , Rfl:    busPIRone (BUSPAR) 5 MG tablet, Take 1 tablet (5 mg total) by mouth 2 (two) times daily., Disp: 60 tablet, Rfl: 0   diclofenac Sodium (VOLTAREN) 1 % GEL, Apply topically daily as needed., Disp: , Rfl:    DULoxetine (CYMBALTA) 60 MG capsule, Take 60 mg by mouth daily after breakfast. , Disp: , Rfl:    FERRETTS 325 (106 Fe) MG TABS tablet, Take 1 tablet by mouth daily., Disp: , Rfl:    methocarbamol (ROBAXIN) 500 MG tablet, Take 1 tablet (500 mg total) by mouth every 6 (six) hours as needed for muscle spasms. (Patient taking differently: Take 500 mg by mouth See admin instructions. Take one tablet (500 mg) by mouth daily at bedtime, may also take one tablet (500 mg) during the day as needed for muscle spasms), Disp: 60 tablet, Rfl: 0   metoCLOPramide (REGLAN) 5 MG tablet, TAKE 1 TABLET BY MOUTH 3 TIMES DAILY BEFORE MEALS. 20 TO 30 MINUTES BEFORE MEALS, Disp: 90 tablet, Rfl: 2   oxyCODONE-acetaminophen (PERCOCET) 7.5-325 MG tablet, Take 1 tablet by mouth every 4 (four) hours as needed for severe pain., Disp: 30 tablet, Rfl: 0   rOPINIRole (REQUIP) 1 MG tablet, Take 1 mg by mouth 3 (three) times daily. , Disp: , Rfl:    rosuvastatin (CRESTOR) 10 MG tablet, Take 10 mg by mouth daily., Disp: , Rfl:    traZODone (DESYREL) 50 MG tablet, TAKE 0.5-1 TABLETS (25-50 MG TOTAL) BY MOUTH AT BEDTIME AS NEEDED FOR SLEEP., Disp: 90 tablet, Rfl: 1   albuterol (PROVENTIL HFA;VENTOLIN HFA) 108 (90 Base) MCG/ACT inhaler, Inhale 2 puffs into the lungs every 4 (four) hours as needed for wheezing or shortness of breath (cough)., Disp: 1 Inhaler, Rfl: 1   aspirin EC 81 MG  tablet, Take 1 tablet (81 mg total) by mouth 2 (two) times daily. (Patient not taking: Reported on 10/25/2020), Disp: 60 tablet, Rfl: 0   atorvastatin (LIPITOR) 20 MG tablet, Take 20 mg by mouth daily. (Patient not taking: No sig reported), Disp: , Rfl:    clobetasol ointment (TEMOVATE) 0.45 %, Apply 1 application topically 2 (two) times daily. (Patient not taking: Reported on 10/25/2020), Disp: 30 g, Rfl: 3   gemfibrozil (LOPID) 600 MG tablet, Take 600 mg by mouth 2 (two) times daily before a meal. (Patient not taking: No sig reported), Disp: , Rfl:    losartan-hydrochlorothiazide (HYZAAR) 50-12.5 MG tablet, Take 0.5 tablets by mouth every morning. (Patient not taking: Reported on 10/25/2020), Disp: 90 tablet, Rfl: 3   nicotine (NICODERM CQ - DOSED IN MG/24 HOURS) 21 mg/24hr patch, 21 mg daily. (Patient not taking: No sig reported), Disp: , Rfl:    nitroGLYCERIN (NITROSTAT) 0.4 MG SL tablet, Place 0.4 mg under the tongue every 5 (five) minutes x 3 doses as needed for chest pain. (Patient not taking: No sig reported), Disp: , Rfl: 1   omeprazole (PRILOSEC) 40 MG capsule, omeprazole 40 mg capsule,delayed release (Patient not taking: No sig reported), Disp: , Rfl:    ondansetron (ZOFRAN) 4 MG tablet, daily as needed. (Patient not taking: No sig reported),  Disp: , Rfl:    OVER THE COUNTER MEDICATION, Apply 1 application topically as needed (joint pain). Magnilife topicall muscle rub  (Patient not taking: No sig reported), Disp: , Rfl:

## 2021-02-12 DIAGNOSIS — M67471 Ganglion, right ankle and foot: Secondary | ICD-10-CM | POA: Insufficient documentation

## 2021-02-16 ENCOUNTER — Other Ambulatory Visit: Payer: Self-pay | Admitting: Physician Assistant

## 2021-03-06 ENCOUNTER — Other Ambulatory Visit: Payer: Self-pay | Admitting: Adult Health

## 2021-03-06 DIAGNOSIS — Z1231 Encounter for screening mammogram for malignant neoplasm of breast: Secondary | ICD-10-CM

## 2021-03-08 ENCOUNTER — Telehealth: Payer: Self-pay | Admitting: *Deleted

## 2021-03-08 DIAGNOSIS — Z006 Encounter for examination for normal comparison and control in clinical research program: Secondary | ICD-10-CM

## 2021-03-08 NOTE — Telephone Encounter (Signed)
I called patient to set up appointment for her to come by and sign new consent for Transcend Study. Patient will come by after her mammogram appointment.

## 2021-03-09 ENCOUNTER — Ambulatory Visit: Payer: Medicare Other

## 2021-03-10 ENCOUNTER — Other Ambulatory Visit: Payer: Self-pay

## 2021-03-10 DIAGNOSIS — M79671 Pain in right foot: Secondary | ICD-10-CM

## 2021-03-14 ENCOUNTER — Encounter: Payer: Medicare Other | Admitting: Vascular Surgery

## 2021-03-14 ENCOUNTER — Inpatient Hospital Stay (HOSPITAL_COMMUNITY): Admission: RE | Admit: 2021-03-14 | Payer: Medicare Other | Source: Ambulatory Visit

## 2021-03-21 ENCOUNTER — Ambulatory Visit: Payer: Medicare Other | Admitting: Vascular Surgery

## 2021-03-21 ENCOUNTER — Ambulatory Visit (HOSPITAL_COMMUNITY)
Admission: RE | Admit: 2021-03-21 | Discharge: 2021-03-21 | Disposition: A | Discharge status: Home / Self Care | Payer: Medicare Other | Source: Ambulatory Visit | Attending: Vascular Surgery | Admitting: Vascular Surgery

## 2021-03-21 ENCOUNTER — Encounter: Payer: Medicare Other | Admitting: *Deleted

## 2021-03-21 ENCOUNTER — Other Ambulatory Visit: Payer: Self-pay

## 2021-03-21 ENCOUNTER — Encounter: Payer: Self-pay | Admitting: Vascular Surgery

## 2021-03-21 VITALS — BP 138/68 | HR 80 | Temp 98.1°F | Resp 20 | Ht 61.0 in | Wt 208.0 lb

## 2021-03-21 DIAGNOSIS — M79672 Pain in left foot: Secondary | ICD-10-CM | POA: Diagnosis present

## 2021-03-21 DIAGNOSIS — M79671 Pain in right foot: Secondary | ICD-10-CM

## 2021-03-21 DIAGNOSIS — Z006 Encounter for examination for normal comparison and control in clinical research program: Secondary | ICD-10-CM

## 2021-03-21 NOTE — Research (Addendum)
Transcend Informed Consent  ? ?Subject Name: [name] [facility] ? ?Subject met inclusion and exclusion criteria.  The informed consent form, study requirements and expectations were reviewed with the subject and questions and concerns were addressed prior to the signing of the consent form.  The subject verbalized understanding of the trial requirements.  The subject agreed to participate in the Transcend trial and signed the informed consent on  03/21/2021.  The informed consent was obtained prior to performance of any protocol-specific procedures for the subject.  A copy of the signed informed consent was given to the subject and a copy was placed in the subject's medical record.  ? ?Re-consented to V 6.0 ? ?[name] ? ?

## 2021-03-21 NOTE — Progress Notes (Signed)
Patient ID: [name], female   DOB: [birth year], 71 y.o.   MRN: [id number]  Reason for Consult: New Patient (Initial Visit)   Referred by Alvester Chou, NP  Subjective:     HPI:  [name] is a 71 y.o. female with a history of right mid SFA drug-coated balloon angioplasty as part of the surveillance study with Dr. Einar Gip.  This was in 2018.  She has noted discoloration in the right foot for multiple years and she did undergo knee replacement 2017 and since that time she has had some prominent veins on her foot.  She states that she does have cramping in both feet more recently has a fracture of the left foot which she states is progressing well wearing a boot.  She is down from 2 packs/day to 8 cigarettes/day.  She continues to take aspirin.  Past Medical History:  Diagnosis Date   Anxiety    Aortic atherosclerosis (HCC)    Bilateral cataracts    Carpal tunnel syndrome    Cervical cancer (HCC)    Chronic back pain    Claudication (HCC)    COPD (chronic obstructive pulmonary disease) (HCC)    wheezing   DDD (degenerative disc disease), cervical    DDD (degenerative disc disease), lumbar    Depression    Diverticulosis    Gastroparesis    GERD (gastroesophageal reflux disease)    Hearing loss    Bilateral   History of blood transfusion    History of colon polyps    History of migraine    Hypertension    Internal hemorrhoids    Iron deficiency anemia    Left-sided weakness    Leg swelling    Liver disease    pt unaware   Lung nodule    several small   OA (osteoarthritis)    both hands   Positive ANA (antinuclear antibody)    Pre-diabetes    Raynaud's phenomenon    Restless leg    Seizures (HCC)    no meds for 8 months   Sleep apnea    does not use her cpap   Spondylosis    Stroke ([place]) 2015   no weakness   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   rt   Urge incontinence of urine    Varicose vein of leg    Venous insufficiency    Family History  Problem  Relation Age of Onset   Hyperlipidemia Mother    Hypertension Mother    Anxiety disorder Mother    Depression Mother    Hyperlipidemia Father    Hypertension Father    Alzheimer's disease Father    Anxiety disorder Brother    Cancer Daughter        unknown   Anxiety disorder Brother    Anxiety disorder Brother    Drug abuse Brother    Stroke Brother    Colon cancer Neg Hx    Esophageal cancer Neg Hx    Rectal cancer Neg Hx    Stomach cancer Neg Hx    Past Surgical History:  Procedure Laterality Date   BACK SURGERY     BLEPHAROPLASTY  10/10/2017   CATARACT EXTRACTION, BILATERAL     CESAREAN SECTION     CHOLECYSTECTOMY     COLONOSCOPY  10/2016   HAND SURGERY Bilateral    carpal tunnel   HEMORRHOID SURGERY     INTERSTIM IMPLANT PLACEMENT     KNEE SURGERY Left    arthroscopy x3  LOWER EXTREMITY ANGIOGRAPHY N/A 07/16/2016   Procedure: Lower Extremity Angiography;  Surgeon: Adrian Prows, MD;  Location: [place] [facility];  Service: Cardiovascular;  Laterality: N/A;   LOWER EXTREMITY INTERVENTION N/A 08/06/2016   Procedure: Lower Extremity Intervention;  Surgeon: Adrian Prows, MD;  Location: [place] [facility];  Service: Cardiovascular;  Laterality: N/A;   NECK SURGERY     OTHER SURGICAL HISTORY  2013   bladder stimulator in back   PERIPHERAL VASCULAR BALLOON ANGIOPLASTY  08/06/2016   Procedure: Peripheral Vascular Balloon Angioplasty;  Surgeon: Adrian Prows, MD;  Location: [place] [facility];  Service: Cardiovascular;;  Right SFA   TENNIS ELBOW RELEASE/NIRSCHEL PROCEDURE Right 12/2015   TOE SURGERY Right    right foot second and fourth toe   TOTAL HIP ARTHROPLASTY Left    TOTAL HIP REVISION Left 10/13/2017   Procedure: LEFT TOTAL HIP REVISION ACETABULUM, OPEN REDUCTION INTERNAL WITH BONE GRAFT FIXATION LEFT GREATER TROCHANTERIC FRACTURE;  Surgeon: Frederik Pear, MD;  Location: [facility];  Service: Orthopedics;  Laterality: Left;   TOTAL KNEE ARTHROPLASTY Right 01/12/2016   Procedure:  RIGHT TOTAL KNEE ARTHROPLASTY;  Surgeon: Netta Cedars, MD;  Location: [place];  Service: Orthopedics;  Laterality: Right;   TUBAL LIGATION     UPPER GI ENDOSCOPY  10/2016   VAGINAL HYSTERECTOMY      Short Social History:  Social History   Tobacco Use   Smoking status: Every Day    Packs/day: 1.00    Years: 55.00    Pack years: 55.00    Types: Cigarettes   Smokeless tobacco: Never  Substance Use Topics   Alcohol use: No    Alcohol/week: 0.0 standard drinks    Allergies  Allergen Reactions   Bee Venom Anaphylaxis   Gabapentin Other (See Comments)    "Bad dreams"   Morphine And Related Other (See Comments)    Overly sedated   Codeine Hives   Adhesive [Tape] Other (See Comments)    Redness, Paper tape is ok   Sulfa Antibiotics Other (See Comments)    Unknown childhood reaction   Sulfonamide Derivatives Other (See Comments)    Unknown childhood reaction     Current Outpatient Medications  Medication Sig Dispense Refill   albuterol (PROVENTIL HFA;VENTOLIN HFA) 108 (90 Base) MCG/ACT inhaler Inhale 2 puffs into the lungs every 4 (four) hours as needed for wheezing or shortness of breath (cough). 1 Inhaler 1   aspirin 325 MG EC tablet aspirin 325 mg tablet,delayed release  TAKE 1 TABLET BY MOUTH EVERY DAY     atorvastatin (LIPITOR) 20 MG tablet Take 20 mg by mouth daily.     busPIRone (BUSPAR) 5 MG tablet Take 1 tablet (5 mg total) by mouth 2 (two) times daily. 60 tablet 0   clobetasol ointment (TEMOVATE) 9.16 % Apply 1 application topically 2 (two) times daily. 30 g 3   diclofenac Sodium (VOLTAREN) 1 % GEL Apply topically daily as needed.     DULoxetine (CYMBALTA) 60 MG capsule Take 60 mg by mouth daily after breakfast.      FERRETTS 325 (106 Fe) MG TABS tablet Take 1 tablet by mouth daily.     gemfibrozil (LOPID) 600 MG tablet Take 600 mg by mouth 2 (two) times daily before a meal.     losartan-hydrochlorothiazide (HYZAAR) 50-12.5 MG tablet Take 0.5 tablets by mouth every  morning. 90 tablet 3   methocarbamol (ROBAXIN) 500 MG tablet Take 1 tablet (500 mg total) by mouth every 6 (six) hours  as needed for muscle spasms. (Patient taking differently: Take 500 mg by mouth See admin instructions. Take one tablet (500 mg) by mouth daily at bedtime, may also take one tablet (500 mg) during the day as needed for muscle spasms) 60 tablet 0   metoCLOPramide (REGLAN) 5 MG tablet Take 1 tablet by mouth 3 times daily 20-30 minutes before meals NO FURTHER REFILLS UNTIL SEEN, NEEDS AND APPOINTMENT 90 tablet 0   nicotine (NICODERM CQ - DOSED IN MG/24 HOURS) 21 mg/24hr patch 21 mg daily.     nitroGLYCERIN (NITROSTAT) 0.4 MG SL tablet Place 0.4 mg under the tongue every 5 (five) minutes x 3 doses as needed for chest pain.  1   omeprazole (PRILOSEC) 40 MG capsule omeprazole 40 mg capsule,delayed release     ondansetron (ZOFRAN) 4 MG tablet daily as needed.     OVER THE COUNTER MEDICATION Apply 1 application. topically as needed (joint pain). Magnilife topicall muscle rub     oxyCODONE-acetaminophen (PERCOCET) 10-325 MG tablet oxycodone-acetaminophen 10 mg-325 mg tablet  TAKE 1 TABLET BY MOUTH 5X DAY AS NEEDED FOR PAIN     rOPINIRole (REQUIP) 1 MG tablet Take 1 mg by mouth 3 (three) times daily.      rosuvastatin (CRESTOR) 10 MG tablet Take 10 mg by mouth daily.     traZODone (DESYREL) 50 MG tablet TAKE 0.5-1 TABLETS (25-50 MG TOTAL) BY MOUTH AT BEDTIME AS NEEDED FOR SLEEP. 90 tablet 1   No current facility-administered medications for this visit.    Review of Systems  Constitutional:  Constitutional negative. HENT: HENT negative.  Eyes: Eyes negative.  Respiratory: Respiratory negative.  Cardiovascular: Cardiovascular negative.  GI: Gastrointestinal negative.  Musculoskeletal: Positive for leg pain.  Skin: Skin negative.  Neurological: Neurological negative. Hematologic: Hematologic/lymphatic negative.  Psychiatric: Psychiatric negative.       Objective:  Objective   Vitals:   03/21/21 0852  BP: 138/68  Pulse: 80  Resp: 20  Temp: 98.1 F (36.7 C)  SpO2: 96%     Physical Exam HENT:     Head: Normocephalic.     Nose: Nose normal.  Eyes:     Pupils: Pupils are equal, round, and reactive to light.  Neck:     Vascular: No carotid bruit.  Cardiovascular:     Rate and Rhythm: Normal rate.     Pulses:          Popliteal pulses are 2+ on the right side and 2+ on the left side.       Dorsalis pedis pulses are 2+ on the right side.  Pulmonary:     Effort: Pulmonary effort is normal.     Breath sounds: Normal breath sounds.  Abdominal:     General: Abdomen is flat.     Palpations: Abdomen is soft. There is no mass.  Musculoskeletal:        General: Normal range of motion.     Right lower leg: No edema.     Comments: Left foot boot  Skin:    General: Skin is warm and dry.     Capillary Refill: Capillary refill takes less than 2 seconds.  Neurological:     General: No focal deficit present.     Mental Status: She is alert.  Psychiatric:        Mood and Affect: Mood normal.        Behavior: Behavior normal.        Thought Content: Thought content normal.    Data:  ABI Findings:  +---------+------------------+-----+---------+--------+   Right     Rt Pressure (mmHg) Index Waveform  Comment    +---------+------------------+-----+---------+--------+   Brachial  129                                           +---------+------------------+-----+---------+--------+   PTA       134                1.04  triphasic            +---------+------------------+-----+---------+--------+   DP        143                1.11  triphasic            +---------+------------------+-----+---------+--------+   Great Toe 110                0.85  Normal               +---------+------------------+-----+---------+--------+   +---------+------------------+-----+---------+-------+   Left      Lt Pressure (mmHg) Index Waveform  Comment    +---------+------------------+-----+---------+-------+   Brachial  126                                          +---------+------------------+-----+---------+-------+   PTA       136                1.05  triphasic           +---------+------------------+-----+---------+-------+   DP        126                0.98  triphasic           +---------+------------------+-----+---------+-------+   Great Toe 106                0.82  Normal              +---------+------------------+-----+---------+-------+   +-------+-----------+-----------+------------+------------+   ABI/TBI Today's ABI Today's TBI Previous ABI Previous TBI   +-------+-----------+-----------+------------+------------+   Right   1.11        0.85        0.97         0.81           +-------+-----------+-----------+------------+------------+   Left    1.05        0.82        0.98         0.91           +-------+-----------+-----------+------------+------------+           Summary:  Right: The right toe-brachial index is normal.   Left: Resting left ankle-brachial index is within normal range. No  evidence of significant left lower extremity arterial disease. The left  toe-brachial index is normal.      Assessment/Plan:    71 year old female history of right SFA drug-coated balloon angioplasty.  She is attempting to quit smoking.  She is followed by Dr. Einar Gip.  She can follow-up with me on an as-needed basis.     Waynetta Sandy MD [facility]

## 2021-04-23 ENCOUNTER — Other Ambulatory Visit: Payer: Self-pay | Admitting: Adult Health

## 2021-04-23 DIAGNOSIS — R911 Solitary pulmonary nodule: Secondary | ICD-10-CM

## 2021-04-30 ENCOUNTER — Ambulatory Visit
Admission: RE | Admit: 2021-04-30 | Discharge: 2021-04-30 | Disposition: A | Discharge status: Home / Self Care | Payer: Medicare Other | Source: Ambulatory Visit | Attending: Adult Health | Admitting: Adult Health

## 2021-04-30 DIAGNOSIS — R911 Solitary pulmonary nodule: Secondary | ICD-10-CM

## 2021-06-15 ENCOUNTER — Other Ambulatory Visit: Payer: Self-pay | Admitting: Anesthesiology

## 2021-06-15 ENCOUNTER — Ambulatory Visit
Admission: RE | Admit: 2021-06-15 | Discharge: 2021-06-15 | Disposition: A | Discharge status: Home / Self Care | Payer: Medicare Other | Source: Ambulatory Visit | Attending: Anesthesiology | Admitting: Anesthesiology

## 2021-06-15 DIAGNOSIS — R52 Pain, unspecified: Secondary | ICD-10-CM

## 2021-08-28 ENCOUNTER — Inpatient Hospital Stay (HOSPITAL_COMMUNITY)
Admission: EM | Admit: 2021-08-28 | Discharge: 2021-09-01 | DRG: 378 | Disposition: A | Discharge status: Home Health | Payer: Medicare Other | Attending: Family Medicine | Admitting: Family Medicine

## 2021-08-28 ENCOUNTER — Other Ambulatory Visit: Payer: Self-pay

## 2021-08-28 ENCOUNTER — Encounter (HOSPITAL_COMMUNITY): Payer: Self-pay | Admitting: Pharmacy Technician

## 2021-08-28 ENCOUNTER — Telehealth: Payer: Self-pay | Admitting: Internal Medicine

## 2021-08-28 ENCOUNTER — Emergency Department (HOSPITAL_COMMUNITY): Payer: Medicare Other

## 2021-08-28 DIAGNOSIS — R4182 Altered mental status, unspecified: Secondary | ICD-10-CM | POA: Diagnosis present

## 2021-08-28 DIAGNOSIS — Q438 Other specified congenital malformations of intestine: Secondary | ICD-10-CM

## 2021-08-28 DIAGNOSIS — R7303 Prediabetes: Secondary | ICD-10-CM | POA: Diagnosis present

## 2021-08-28 DIAGNOSIS — Z8673 Personal history of transient ischemic attack (TIA), and cerebral infarction without residual deficits: Secondary | ICD-10-CM

## 2021-08-28 DIAGNOSIS — D649 Anemia, unspecified: Principal | ICD-10-CM

## 2021-08-28 DIAGNOSIS — K921 Melena: Secondary | ICD-10-CM | POA: Diagnosis not present

## 2021-08-28 DIAGNOSIS — Z8541 Personal history of malignant neoplasm of cervix uteri: Secondary | ICD-10-CM

## 2021-08-28 DIAGNOSIS — Z5989 Other problems related to housing and economic circumstances: Secondary | ICD-10-CM

## 2021-08-28 DIAGNOSIS — I10 Essential (primary) hypertension: Secondary | ICD-10-CM | POA: Diagnosis present

## 2021-08-28 DIAGNOSIS — Z96651 Presence of right artificial knee joint: Secondary | ICD-10-CM | POA: Diagnosis present

## 2021-08-28 DIAGNOSIS — Z83438 Family history of other disorder of lipoprotein metabolism and other lipidemia: Secondary | ICD-10-CM

## 2021-08-28 DIAGNOSIS — Z7982 Long term (current) use of aspirin: Secondary | ICD-10-CM

## 2021-08-28 DIAGNOSIS — F17210 Nicotine dependence, cigarettes, uncomplicated: Secondary | ICD-10-CM | POA: Diagnosis present

## 2021-08-28 DIAGNOSIS — Z823 Family history of stroke: Secondary | ICD-10-CM

## 2021-08-28 DIAGNOSIS — D509 Iron deficiency anemia, unspecified: Secondary | ICD-10-CM

## 2021-08-28 DIAGNOSIS — G8929 Other chronic pain: Secondary | ICD-10-CM | POA: Diagnosis present

## 2021-08-28 DIAGNOSIS — I7300 Raynaud's syndrome without gangrene: Secondary | ICD-10-CM | POA: Diagnosis present

## 2021-08-28 DIAGNOSIS — K219 Gastro-esophageal reflux disease without esophagitis: Secondary | ICD-10-CM | POA: Diagnosis present

## 2021-08-28 DIAGNOSIS — Z8616 Personal history of COVID-19: Secondary | ICD-10-CM

## 2021-08-28 DIAGNOSIS — K922 Gastrointestinal hemorrhage, unspecified: Secondary | ICD-10-CM | POA: Diagnosis present

## 2021-08-28 DIAGNOSIS — Z885 Allergy status to narcotic agent status: Secondary | ICD-10-CM

## 2021-08-28 DIAGNOSIS — G4730 Sleep apnea, unspecified: Secondary | ICD-10-CM | POA: Diagnosis present

## 2021-08-28 DIAGNOSIS — D125 Benign neoplasm of sigmoid colon: Secondary | ICD-10-CM

## 2021-08-28 DIAGNOSIS — Z882 Allergy status to sulfonamides status: Secondary | ICD-10-CM

## 2021-08-28 DIAGNOSIS — K626 Ulcer of anus and rectum: Secondary | ICD-10-CM

## 2021-08-28 DIAGNOSIS — Z888 Allergy status to other drugs, medicaments and biological substances status: Secondary | ICD-10-CM

## 2021-08-28 DIAGNOSIS — M549 Dorsalgia, unspecified: Secondary | ICD-10-CM | POA: Diagnosis present

## 2021-08-28 DIAGNOSIS — R0902 Hypoxemia: Secondary | ICD-10-CM | POA: Diagnosis not present

## 2021-08-28 DIAGNOSIS — E785 Hyperlipidemia, unspecified: Secondary | ICD-10-CM | POA: Diagnosis present

## 2021-08-28 DIAGNOSIS — J449 Chronic obstructive pulmonary disease, unspecified: Secondary | ICD-10-CM | POA: Diagnosis present

## 2021-08-28 DIAGNOSIS — I679 Cerebrovascular disease, unspecified: Secondary | ICD-10-CM

## 2021-08-28 DIAGNOSIS — K648 Other hemorrhoids: Secondary | ICD-10-CM | POA: Diagnosis present

## 2021-08-28 DIAGNOSIS — K5909 Other constipation: Secondary | ICD-10-CM | POA: Diagnosis present

## 2021-08-28 DIAGNOSIS — Z818 Family history of other mental and behavioral disorders: Secondary | ICD-10-CM

## 2021-08-28 DIAGNOSIS — I739 Peripheral vascular disease, unspecified: Secondary | ICD-10-CM | POA: Diagnosis present

## 2021-08-28 DIAGNOSIS — K621 Rectal polyp: Secondary | ICD-10-CM | POA: Diagnosis present

## 2021-08-28 DIAGNOSIS — K3184 Gastroparesis: Secondary | ICD-10-CM | POA: Diagnosis present

## 2021-08-28 DIAGNOSIS — R0600 Dyspnea, unspecified: Secondary | ICD-10-CM | POA: Diagnosis present

## 2021-08-28 DIAGNOSIS — K2970 Gastritis, unspecified, without bleeding: Secondary | ICD-10-CM

## 2021-08-28 DIAGNOSIS — Z91030 Bee allergy status: Secondary | ICD-10-CM

## 2021-08-28 DIAGNOSIS — Z96642 Presence of left artificial hip joint: Secondary | ICD-10-CM | POA: Diagnosis present

## 2021-08-28 DIAGNOSIS — Z8249 Family history of ischemic heart disease and other diseases of the circulatory system: Secondary | ICD-10-CM

## 2021-08-28 DIAGNOSIS — Z79899 Other long term (current) drug therapy: Secondary | ICD-10-CM

## 2021-08-28 DIAGNOSIS — D62 Acute posthemorrhagic anemia: Secondary | ICD-10-CM | POA: Diagnosis present

## 2021-08-28 DIAGNOSIS — D500 Iron deficiency anemia secondary to blood loss (chronic): Secondary | ICD-10-CM

## 2021-08-28 DIAGNOSIS — K5900 Constipation, unspecified: Secondary | ICD-10-CM

## 2021-08-28 DIAGNOSIS — G2581 Restless legs syndrome: Secondary | ICD-10-CM | POA: Diagnosis present

## 2021-08-28 DIAGNOSIS — Z91048 Other nonmedicinal substance allergy status: Secondary | ICD-10-CM

## 2021-08-28 DIAGNOSIS — K5730 Diverticulosis of large intestine without perforation or abscess without bleeding: Secondary | ICD-10-CM | POA: Diagnosis present

## 2021-08-28 LAB — CBC WITH DIFFERENTIAL/PLATELET
Abs Immature Granulocytes: 0 10*3/uL (ref 0.00–0.07)
Basophils Absolute: 0 10*3/uL (ref 0.0–0.1)
Basophils Relative: 0 %
Eosinophils Absolute: 0.2 10*3/uL (ref 0.0–0.5)
Eosinophils Relative: 2 %
HCT: 20.5 % — ABNORMAL LOW (ref 36.0–46.0)
Hemoglobin: 5.3 g/dL — CL (ref 12.0–15.0)
Lymphocytes Relative: 10 %
Lymphs Abs: 1 10*3/uL (ref 0.7–4.0)
MCH: 17 pg — ABNORMAL LOW (ref 26.0–34.0)
MCHC: 25.9 g/dL — ABNORMAL LOW (ref 30.0–36.0)
MCV: 65.9 fL — ABNORMAL LOW (ref 80.0–100.0)
Monocytes Absolute: 0.2 10*3/uL (ref 0.1–1.0)
Monocytes Relative: 2 %
Neutro Abs: 8.6 10*3/uL — ABNORMAL HIGH (ref 1.7–7.7)
Neutrophils Relative %: 86 %
Platelets: 267 10*3/uL (ref 150–400)
RBC: 3.11 MIL/uL — ABNORMAL LOW (ref 3.87–5.11)
RDW: 20 % — ABNORMAL HIGH (ref 11.5–15.5)
WBC: 10 10*3/uL (ref 4.0–10.5)
nRBC: 0 /100 WBC
nRBC: 0.2 % (ref 0.0–0.2)

## 2021-08-28 LAB — BASIC METABOLIC PANEL
Anion gap: 7 (ref 5–15)
BUN: 8 mg/dL (ref 8–23)
CO2: 23 mmol/L (ref 22–32)
Calcium: 8.5 mg/dL — ABNORMAL LOW (ref 8.9–10.3)
Chloride: 104 mmol/L (ref 98–111)
Creatinine, Ser: 0.69 mg/dL (ref 0.44–1.00)
GFR, Estimated: >60 mL/min (ref >60)
Glucose, Bld: 105 mg/dL — ABNORMAL HIGH (ref 70–99)
Potassium: 4 mmol/L (ref 3.5–5.1)
Sodium: 134 mmol/L — ABNORMAL LOW (ref 135–145)

## 2021-08-28 LAB — POC OCCULT BLOOD, ED: Fecal Occult Bld: POSITIVE — AB

## 2021-08-28 LAB — HEMOGLOBIN AND HEMATOCRIT, BLOOD
HCT: 21.6 % — ABNORMAL LOW (ref 36.0–46.0)
Hemoglobin: 5.5 g/dL — CL (ref 12.0–15.0)

## 2021-08-28 LAB — PREPARE RBC (CROSSMATCH)

## 2021-08-28 MED ORDER — ACETAMINOPHEN 650 MG RE SUPP: 650.0000 mg | Freq: Four times a day (QID) | RECTAL | Status: DC | PRN

## 2021-08-28 MED ORDER — FENTANYL CITRATE PF 50 MCG/ML IJ SOSY
75.0000 ug | PREFILLED_SYRINGE | Freq: Once | INTRAMUSCULAR | Status: AC
  Administered 2021-08-28: 75 ug via INTRAVENOUS
  Filled 2021-08-28: qty 2

## 2021-08-28 MED ORDER — PANTOPRAZOLE SODIUM 40 MG IV SOLR
40.0000 mg | Freq: Once | INTRAVENOUS | Status: AC
  Administered 2021-08-28: 40 mg via INTRAVENOUS
  Filled 2021-08-28: qty 10

## 2021-08-28 MED ORDER — ACETAMINOPHEN 325 MG PO TABS
650.0000 mg | ORAL_TABLET | Freq: Four times a day (QID) | ORAL | Status: DC | PRN
  Administered 2021-08-30 – 2021-08-31 (×3): 650 mg via ORAL
  Filled 2021-08-28 (×3): qty 2

## 2021-08-28 MED ORDER — ONDANSETRON HCL 4 MG PO TABS
4.0000 mg | ORAL_TABLET | Freq: Four times a day (QID) | ORAL | Status: DC | PRN
  Administered 2021-08-30: 4 mg via ORAL
  Filled 2021-08-28: qty 1

## 2021-08-28 MED ORDER — NICOTINE 21 MG/24HR TD PT24
21.0000 mg | MEDICATED_PATCH | Freq: Every day | TRANSDERMAL | Status: DC
  Administered 2021-08-29 (×2): 21 mg via TRANSDERMAL
  Filled 2021-08-28 (×2): qty 1

## 2021-08-28 MED ORDER — ONDANSETRON HCL 4 MG/2ML IJ SOLN
4.0000 mg | Freq: Four times a day (QID) | INTRAMUSCULAR | Status: DC | PRN
  Administered 2021-08-29: 4 mg via INTRAVENOUS
  Filled 2021-08-28: qty 2

## 2021-08-28 MED ORDER — SODIUM CHLORIDE 0.9 % IV SOLN: 10.0000 mL/h | Freq: Once | INTRAVENOUS | Status: DC

## 2021-08-28 MED ORDER — SODIUM CHLORIDE 0.9 % IV BOLUS
1000.0000 mL | Freq: Once | INTRAVENOUS | Status: AC
  Administered 2021-08-28: 1000 mL via INTRAVENOUS

## 2021-08-28 NOTE — ED Provider Triage Note (Signed)
Emergency Medicine Provider Triage Evaluation Note  [name] , a 71 y.o. female  was evaluated in triage.  Pt complains of rectal pain.  Patient states last bowel movement was 8 days ago, denies any nausea or vomiting.  She having pain in her rectum, she has tried fiber, enemas, stool softeners with no relief.  Called her GI doctor Dr. Henrene Pastor who advised her to go to the emergency department for disimpaction..  Review of Systems  Per Physical Exam  BP 136/66 (BP Location: Left Arm)   Pulse 97   Temp 98.5 F (36.9 C) (Oral)   Resp 16   SpO2 100%  Gen:   Awake, no distress   Resp:  Normal effort  MSK:   Moves extremities without difficulty  Other:  Abdomen is soft and nontender, rectal exam deferred  Medical Decision Making  Medically screening exam initiated at 4:24 PM.  Appropriate orders placed.  [name] was informed that the remainder of the evaluation will be completed by another provider, this initial triage assessment does not replace that evaluation, and the importance of remaining in the ED until their evaluation is complete.     Sherrill Raring, PA-C 08/28/21 1624

## 2021-08-28 NOTE — Telephone Encounter (Signed)
Pt states she has hard stool she can feel that she cannot pass. Reports she is in a lot of pain, she has tried laxatives and an enema. Discussed with pt that she may have an impaction and she will need to go to the ER to have the impaction removed. Pt verbalized understanding.

## 2021-08-28 NOTE — Telephone Encounter (Signed)
Patient called again requesting to speak to a nurse.  Patient said she has taken laxatives, enemas, stool softeners, and castor oil.  She can't sit, stand, walk, or lay because she is in such distress.  Please call patient and advise.  Thank you.

## 2021-08-28 NOTE — H&P (Incomplete)
Hospital Admission History and Physical Service Pager: [phone]  Patient name: [name] Medical record number: [id number] Date of Birth: 1950/09/05 Age: 71 y.o. Gender: female  Primary Care Provider: Alvester Chou, NP Consultants: GI Code Status: FULL Preferred Emergency Contact: Dierdre Searles, spouse Contact Information     Name Relation Home Work [place] [address] [place] [phone]  [phone]   [facility] Daughter   [phone]        Chief Complaint: Constipation  Assessment and Plan: [name] is a 71 y.o. female presenting with constipation following melena for several weeks.  Patient had 2 bowel movements while in ED which were hard with bright red blood.  Her abdominal pain is improved with defecation.  Possible daily NSAID use, history of polyps on colonoscopy. Hemoglobin of 5.3, receiving 1 unit of blood at time of admission.  Differential for presentation of this includes upper GI bleed versus lower GI bleed.  History supports upper GI bleed origin, however hematochezia present today.  No notes have been filed under this hospital service. Service: Family Medicine       FEN/GI: N.p.o. after midnight, sips with meds. VTE Prophylaxis: None  Disposition: Med-Surg, pending clinical improvement.  History of Present Illness:  [name] is a 71 y.o. female presenting with constipation.  She reports melena for the past several weeks. She noticed some pinkish red blood in the stool today. Patient reports she has had chronic issues with constipation, last BM was 8 days ago but did have 2 BM in the ED earlier today. She had been having abdominal pain which has now resolved after having BM today. She has had issues with heartburn for which she takes Tums for. She has had some SOB and mild cough. Denies hematochezia and coffee grounds emesis. Reports last colonoscopy was 2 years ago, was told she had a lot of polyps. She reports  history of lung nodules but no diagnosis of lung cancer.  In the ED, ***  Review Of Systems: Per HPI with the following additions:  Review of Systems  Respiratory:  Positive for shortness of breath. Negative for cough.   Cardiovascular:  Negative for chest pain.  Gastrointestinal:  Positive for abdominal pain, constipation and nausea.  Neurological:  Positive for dizziness and headaches (mild). Negative for loss of consciousness.     Pertinent Past Medical History: Cervical cancer s/p "frozen" HTN GERD HLD Seizures (not currently on medications) Remainder reviewed in history tab.   Pertinent Past Surgical History: Colonoscopy 2 years ago per patient  Cholecystectomy Cesarean section Hip arthroplasty x2 Knee arthroplasty Back surgery Remainder reviewed in history tab.  Pertinent Social History: Tobacco use: Yes/No/Former 1.5 ppd smoker Alcohol use: denies Other Substance use: denies Lives with husband, granddaughter  Pertinent Family History: Unknown if FMHx of GI malignancy  Remainder reviewed in history tab.   Important Outpatient Medications: Ropinirole Oxycodone prn Trazodone prn Cholesterol medication Duloxetine Albuterol inhaler prn Iron supplement Losartan-HCTZ Vitamin D ASA 325 mg Remainder reviewed in medication history.   Objective: BP (!) 149/66   Pulse (!) 105   Temp 98.6 F (37 C) (Oral)   Resp 12   SpO2 95%  Exam: General: Tired but alert, ill-appearing, not in acute distress Eyes: Pale conjunctiva Cardiovascular: Regular rate and rhythm, systolic murmur heard Respiratory: Mild expiratory wheezing bilaterally Gastrointestinal: Bowel sounds present, no rebound tenderness.  Tenderness to palpation in epigastric region. Neuro: Alert and oriented x3  Labs:  CBC BMET  Recent  Labs  Lab 08/28/21 1638 08/28/21 2023  WBC 10.0  --   HGB 5.3* 5.5*  HCT 20.5* 21.6*  PLT 267  --    Recent Labs  Lab 08/28/21 1638  NA 134*  K 4.0  CL 104   CO2 23  BUN 8  CREATININE 0.69  GLUCOSE 105*  CALCIUM 8.5*    Pertinent additional labs ***.  EKG: My own interpretation (not copied from electronic read) ***    Imaging Studies Performed:  Imaging Study (ie. Chest x-ray) Impression from Radiologist: ***   My Interpretation: Leslie Dales, MD 08/28/2021, 10:37 PM PGY-***, [place] Intern pager: [phone], text pages welcome Secure chat group [place]

## 2021-08-28 NOTE — ED Notes (Signed)
HGB 5.3. PA Sage notified.

## 2021-08-28 NOTE — ED Provider Notes (Signed)
[facility] Provider Note   CSN: [id number] Arrival date & time: 08/28/21  1506     History  Chief Complaint  Patient presents with   Constipation    [name] is a 70 y.o. female with a past medical history significant for hypertension, stroke, COPD, acid reflux, obesity, previous cancer, previous cholecystectomy, and C-section who presents with concern for constipation for the last 8 days.  She tried over-the-counter stool softeners, laxatives, and enemas without relief.  She had doctor told her to go to the ED.  Initially patient reports that she has been more fatigued over the last several days to weeks, she denies shortness of breath, chest pain.  She does have a increased tendency to eat ice.   Constipation      Home Medications Prior to Admission medications   Medication Sig Start Date End Date Taking? Authorizing Provider  albuterol (PROVENTIL HFA;VENTOLIN HFA) 108 (90 Base) MCG/ACT inhaler Inhale 2 puffs into the lungs every 4 (four) hours as needed for wheezing or shortness of breath (cough). 12/25/17   Tanda Rockers, MD  aspirin 325 MG EC tablet aspirin 325 mg tablet,delayed release  TAKE 1 TABLET BY MOUTH EVERY DAY    [provider]  atorvastatin (LIPITOR) 20 MG tablet Take 20 mg by mouth daily. 02/01/19   [provider]  busPIRone (BUSPAR) 5 MG tablet Take 1 tablet (5 mg total) by mouth 2 (two) times daily. 02/18/18   Ursula Alert, MD  clobetasol ointment (TEMOVATE) 1.60 % Apply 1 application topically 2 (two) times daily. 04/14/20   Estill Dooms, NP  diclofenac Sodium (VOLTAREN) 1 % GEL Apply topically daily as needed. 03/02/20   [provider]  DULoxetine (CYMBALTA) 60 MG capsule Take 60 mg by mouth daily after breakfast.     [provider]  FERRETTS 325 (106 Fe) MG TABS tablet Take 1 tablet by mouth daily. 11/18/19   [provider]  gemfibrozil (LOPID) 600 MG tablet  Take 600 mg by mouth 2 (two) times daily before a meal.    [provider]  losartan-hydrochlorothiazide (HYZAAR) 50-12.5 MG tablet Take 0.5 tablets by mouth every morning. 03/03/20   Adrian Prows, MD  methocarbamol (ROBAXIN) 500 MG tablet Take 1 tablet (500 mg total) by mouth every 6 (six) hours as needed for muscle spasms. Patient taking differently: Take 500 mg by mouth See admin instructions. Take one tablet (500 mg) by mouth daily at bedtime, may also take one tablet (500 mg) during the day as needed for muscle spasms 10/13/17   Leighton Parody, PA-C  metoCLOPramide (REGLAN) 5 MG tablet Take 1 tablet by mouth 3 times daily 20-30 minutes before meals NO FURTHER REFILLS UNTIL SEEN, NEEDS AND APPOINTMENT 02/19/21   Irene Shipper, MD  nicotine (NICODERM CQ - DOSED IN MG/24 HOURS) 21 mg/24hr patch 21 mg daily. 10/29/19   [provider]  nitroGLYCERIN (NITROSTAT) 0.4 MG SL tablet Place 0.4 mg under the tongue every 5 (five) minutes x 3 doses as needed for chest pain. 05/17/16   [provider]  omeprazole (PRILOSEC) 40 MG capsule omeprazole 40 mg capsule,delayed release    [provider]  ondansetron (ZOFRAN) 4 MG tablet daily as needed.    [provider]  OVER THE COUNTER MEDICATION Apply 1 application. topically as needed (joint pain). Magnilife topicall muscle rub    [provider]  oxyCODONE-acetaminophen (PERCOCET) 10-325 MG tablet oxycodone-acetaminophen 10 mg-325 mg tablet  TAKE 1 TABLET BY MOUTH 5X DAY AS NEEDED FOR PAIN    [provider]  rOPINIRole (REQUIP) 1 MG tablet Take 1 mg by mouth 3 (three) times daily.     [provider]  rosuvastatin (CRESTOR) 10 MG tablet Take 10 mg by mouth daily. 06/10/20   [provider]  traZODone (DESYREL) 50 MG tablet TAKE 0.5-1 TABLETS (25-50 MG TOTAL) BY MOUTH AT BEDTIME AS NEEDED FOR SLEEP. 03/16/18   Ursula Alert, MD      Allergies    Bee venom, Gabapentin, Morphine and  related, Codeine, Adhesive [tape], Sulfa antibiotics, and Sulfonamide derivatives    Review of Systems   Review of Systems  Gastrointestinal:  Positive for constipation.  All other systems reviewed and are negative.   Physical Exam Updated Vital Signs BP (!) 149/66   Pulse (!) 106   Temp 98.6 F (37 C) (Oral)   Resp 19   SpO2 98%  Physical Exam Vitals and nursing note reviewed.  Constitutional:      General: She is not in acute distress.    Appearance: Normal appearance.  HENT:     Head: Normocephalic and atraumatic.  Eyes:     General:        Right eye: No discharge.        Left eye: No discharge.  Cardiovascular:     Rate and Rhythm: Normal rate and regular rhythm.     Heart sounds: No murmur heard.    No friction rub. No gallop.  Pulmonary:     Effort: Pulmonary effort is normal.     Breath sounds: Normal breath sounds.  Abdominal:     General: Bowel sounds are normal.     Palpations: Abdomen is soft.     Comments: Tender abdomen but is somewhat distended, she has infrequent bowel sounds  Skin:    General: Skin is warm and dry.     Capillary Refill: Capillary refill takes less than 2 seconds.     Coloration: Skin is pale.  Neurological:     Mental Status: She is alert and oriented to person, place, and time.  Psychiatric:        Mood and Affect: Mood normal.        Behavior: Behavior normal.     ED Results / Procedures / Treatments   Labs (all labs ordered are listed, but only abnormal results are displayed) Labs Reviewed  CBC WITH DIFFERENTIAL/PLATELET - Abnormal; Notable for the following components:      Result Value   RBC 3.11 (*)    Hemoglobin 5.3 (*)    HCT 20.5 (*)    MCV 65.9 (*)    MCH 17.0 (*)    MCHC 25.9 (*)    RDW 20.0 (*)    Neutro Abs 8.6 (*)    All other components within normal limits  BASIC METABOLIC PANEL - Abnormal; Notable for the following components:   Sodium 134 (*)    Glucose, Bld 105 (*)    Calcium 8.5 (*)    All other  components within normal limits  HEMOGLOBIN AND HEMATOCRIT, BLOOD - Abnormal; Notable for the following components:   Hemoglobin 5.5 (*)    HCT 21.6 (*)    All other components within normal limits  POC OCCULT BLOOD, ED - Abnormal; Notable for the following components:   Fecal Occult Bld POSITIVE (*)    All other components within normal limits  TYPE AND SCREEN  PREPARE RBC (CROSSMATCH)  EKG None  Radiology DG Abd 2 Views  Result Date: 08/28/2021 CLINICAL DATA:  Constipation. EXAM: ABDOMEN - 2 VIEW COMPARISON:  None Available. FINDINGS: The bowel gas pattern is normal. There is a large amount of stool throughout the entire colon. There is also a large amount of stool in the rectum. There is no evidence of free air. No radio-opaque calculi. Cholecystectomy clips are present. Left-sided sacral stimulator device is present. Left hip arthroplasty is present. IMPRESSION: 1. Nonobstructive bowel gas pattern. 2. Large amount of stool throughout the colon and within the rectum. Electronically Signed   By: Ronney Asters M.D.   On: 08/28/2021 17:43    Procedures .Critical Care  Performed by: Anselmo Pickler, PA-C Authorized by: Anselmo Pickler, PA-C   Critical care provider statement:    Critical care time (minutes):  32   Critical care was necessary to treat or prevent imminent or life-threatening deterioration of the following conditions:  Circulatory failure (symptomatic anemia requiring transfusion)   Critical care was time spent personally by me on the following activities:  Development of treatment plan with patient or surrogate, discussions with consultants, evaluation of patient's response to treatment, examination of patient, ordering and review of laboratory studies, ordering and review of radiographic studies, ordering and performing treatments and interventions, pulse oximetry, re-evaluation of patient's condition and review of old charts   Care discussed with: admitting  provider       Medications Ordered in ED Medications  0.9 %  sodium chloride infusion (0 mL/hr Intravenous Hold 08/28/21 2157)  sodium chloride 0.9 % bolus 1,000 mL (1,000 mLs Intravenous New Bag/Given 08/28/21 2145)  fentaNYL (SUBLIMAZE) injection 75 mcg (75 mcg Intravenous Given 08/28/21 2146)  pantoprazole (PROTONIX) injection 40 mg (40 mg Intravenous Given 08/28/21 2145)    ED Course/ Medical Decision Making/ A&P                           Medical Decision Making Amount and/or Complexity of Data Reviewed Labs: ordered.  Risk Prescription drug management.   This patient is a 71 y.o. female who presents to the ED for concern of constipation, abdominal pain, on history patient is also endorsing some fatigue, weakness, shortness of breath with exertion she denies any chest pain, this involves an extensive number of treatment options, and is a complaint that carries with it a high risk of complications and morbidity. The emergent differential diagnosis prior to evaluation includes, but is not limited to, symptomatic anemia, dehydration, electrolyte abnormality, constipation, small bowel obstruction, other intra-abdominal abnormality versus other.   This is not an exhaustive differential.   Past Medical History / Co-morbidities / Social History: Previous history of intra-abdominal surgeries, cholecystectomy, C-section, hypertension, stroke, COPD, acid reflux, obesity, previous cancer  Additional history: Chart reviewed. Pertinent results include: reviewed outpatient PCP, GI visits   Physical Exam: Physical exam performed. The pertinent findings include: Patient is pale on physical exam, she has distended painful abdomen with some quiet bowel sounds.  She has significant improvement of abdominal pain after bowel movement during her stay in the emergency department  Lab Tests: I ordered, and personally interpreted labs.  The pertinent results include: CBC notable for significant anemia,  hemoglobin 5.3. Microcytic quality is suggestive of some iron deficiency, however I suspect that there is some degree of hemorrhage.  Patient does have positive Hemoccult.  BMP with mild hyponatremia, sodium 134.   Imaging Studies: I ordered imaging studies including plain film  abdomen 2 view. I independently visualized and interpreted imaging which showed constipation without obstructive pattern. I agree with the radiologist interpretation.   Medications: I ordered medication including Protonix for presumed upper GI bleed, fentanyl for pain, 2 units of red blood for anemia, fluid bolus for tachycardia. Reevaluation of the patient after these medicines showed that the patient improved. I have reviewed the patients home medicines and have made adjustments as needed.  Consultations Obtained: I requested consultation with the hospitalist, spoke with Dr. Nelda Bucks,  and discussed lab and imaging findings as well as pertinent plan - they recommend: admission   Disposition: After consideration of the diagnostic results and the patients response to treatment, I feel that patient would benefit from admission.  Initially considered CT abdomen pelvis given abdominal pain, concern for possible bowel obstruction, however after patient had bowel movement have low clinical concern for occult small bowel obstruction, we will continue to treat for upper GI bleed and symptomatic anemia.  I discussed this case with my attending physician Dr. Tyrone Nine who cosigned this note including patient's presenting symptoms, physical exam, and planned diagnostics and interventions. Attending physician stated agreement with plan or made changes to plan which were implemented.    Final Clinical Impression(s) / ED Diagnoses Final diagnoses:  Symptomatic anemia    Rx / DC Orders ED Discharge Orders     None         Anselmo Pickler, PA-C 08/28/21 [place], [place], DO 08/28/21 2326

## 2021-08-28 NOTE — ED Triage Notes (Signed)
Pt here with reports of constipation. LBM 8 days ago. Pt has tried OTC meds without relief. Called her GI doctor who referred her to the ED.

## 2021-08-28 NOTE — H&P (Shared)
Hospital Admission History and Physical Service Pager: [phone]  Patient name: [name] Medical record number: [id number] Date of Birth: 1950/12/03 Age: 71 y.o. Gender: female  Primary Care Provider: Alvester Chou, NP Consultants: GI Code Status: FULL Preferred Emergency Contact: [name], spouse Contact Information     Name Relation Home Work [place] [phone]  [phone]   [facility] Daughter   [phone]        Chief Complaint: Constipation  Assessment and Plan: [name] is a 71 y.o. female presenting with constipation following melena for several weeks.  Patient had 2 bowel movements while in ED which were hard with bright red blood.  Her abdominal pain is improved with defecation.  Possible daily NSAID use (patient reports taking Advil for headache most days of the week), history of benign adeno polyps on colonoscopy 3 years ago.  Differential for presentation of this includes upper GI bleed versus lower GI bleed.  History supports upper GI bleed origin, however hematochezia present today and she has history of internal hemorrhoids.  * GI bleed Suspect upper GI bleed with melena. Hemoglobin of 5.3 on admission.  Received 1 unit of blood upon admission, will receive second unit. VSS, normotensive to hypertensive. - Admit to FMTS, attending Dr. Owens Shark - GI consulted in the ED to see in AM, appreciate recs - 2 U RBC ordered in the ED - s/p 1L NS bolus - IV high dose PPI BID - AM CBC, CMP - transfusion threshold 7 - Abdominal x-ray consistent with large stool burden, no signs of obstruction - N.p.o. after midnight sips with meds, anticipate endoscopy   Other conditions chronic and stable: HTN: Hold antihypertensives for now Restless leg syndrome: continue ropinirole HLD: Hold home statin until med rec - unclear which statin she takes She uses albuterol as needed for wheezing and shortness of breath, unable to  find formal diagnosis in chart at this time.  We will continue albuterol as needed.  FEN/GI: N.p.o. after midnight, sips with meds. VTE Prophylaxis: None  Disposition: Med-Surg, pending clinical improvement.  History of Present Illness:  [name] is a 71 y.o. female presenting with constipation.  She reports melena for the past several weeks. She noticed some pinkish red blood in the stool today. Patient reports she has had chronic issues with constipation, last BM was 8 days ago but did have 2 BM in the ED earlier today. She had been having abdominal pain which has now resolved after having BM today. She has had issues with heartburn for which she takes Tums for. She has had some SOB and mild cough. Denies hematochezia and coffee grounds emesis. Reports last colonoscopy was 2 years ago, was told she had a lot of polyps. She reports history of lung nodules but no diagnosis of lung cancer.  In the ED, patient's constipation resolved after 2 hard bowel movements with hematochezia.  Patient reported improvement in abdominal pain after defecation.  She was found to have a hemoglobin of 5.3 and ED ordered 2 units RBC. She has received 1 unit at time of admission.  She received 1 L fluid bolus, and on IV fluids.  First dose of IV PPI was given.  GI was consulted and will see her.  FMTS was consulted for admission.  Review Of Systems: Per HPI with the following additions:  Review of Systems  Respiratory:  Positive for shortness of breath. Negative for cough.   Cardiovascular:  Negative for  chest pain.  Gastrointestinal:  Positive for abdominal pain, constipation and nausea.  Neurological:  Positive for dizziness and headaches (mild). Negative for loss of consciousness.     Pertinent Past Medical History: Internal hemorrhoids Cervical cancer s/p "frozen" HTN GERD HLD Seizures (not currently on medications) Remainder reviewed in history tab.   Pertinent Past Surgical  History: Colonoscopy 2 years ago per patient  Cholecystectomy Cesarean section Hip arthroplasty x2 Knee arthroplasty Back surgery Remainder reviewed in history tab.  Pertinent Social History: Tobacco use: Yes/No/Former 1.5 ppd smoker Alcohol use: denies Other Substance use: denies Lives with husband, granddaughter  Pertinent Family History: Unknown if FMHx of GI malignancy  Remainder reviewed in history tab.   Important Outpatient Medications: Ropinirole Oxycodone prn Trazodone prn Cholesterol medication Duloxetine Albuterol inhaler prn Iron supplement Losartan-HCTZ Vitamin D ASA 325 mg Remainder reviewed in medication history.   Objective: BP (!) 158/64   Pulse (!) 106   Temp 98.6 F (37 C) (Oral)   Resp 14   SpO2 98%  Exam: General: Tired but alert, ill-appearing, not in acute distress Eyes: Pale conjunctiva Cardiovascular: Regular rate and rhythm, systolic murmur heard Respiratory: Mild expiratory wheezing bilaterally Gastrointestinal: Bowel sounds present, no rebound tenderness.  Tenderness to palpation in epigastric region. Neuro: Alert and oriented x3  Labs:  CBC BMET  Recent Labs  Lab 08/28/21 1638 08/28/21 2023  WBC 10.0  --   HGB 5.3* 5.5*  HCT 20.5* 21.6*  PLT 267  --    Recent Labs  Lab 08/28/21 1638  NA 134*  K 4.0  CL 104  CO2 23  BUN 8  CREATININE 0.69  GLUCOSE 105*  CALCIUM 8.5*      Imaging Studies Performed:  Abdominal x-ray Impression from Radiologist:  1. Nonobstructive bowel gas pattern. 2. Large amount of stool throughout the colon and within the rectum  My Interpretation: No evidence of bowel obstruction, however large stool burden.   Leslie Dales, MD 08/29/2021, 12:13 AM PGY-1, [place] Intern pager: [phone], text pages welcome Secure chat group [place]   I was personally present and performed or re-performed the history, physical exam and  medical decision making activities of this service and have verified that the service and findings are accurately documented in the resident's note.  Zola Button, MD                  08/29/2021, 3:03 AM

## 2021-08-28 NOTE — Telephone Encounter (Signed)
Patient called requested to speak with with a nurse regarding not being able to go to the bathroom for about 8 days now. Offered a follow up appt in the office but she declined requested a call from a nurse.

## 2021-08-29 ENCOUNTER — Observation Stay (HOSPITAL_COMMUNITY): Payer: Medicare Other

## 2021-08-29 ENCOUNTER — Encounter (HOSPITAL_COMMUNITY): Payer: Self-pay | Admitting: Student

## 2021-08-29 DIAGNOSIS — D509 Iron deficiency anemia, unspecified: Secondary | ICD-10-CM | POA: Diagnosis not present

## 2021-08-29 DIAGNOSIS — J449 Chronic obstructive pulmonary disease, unspecified: Secondary | ICD-10-CM | POA: Diagnosis present

## 2021-08-29 DIAGNOSIS — K3184 Gastroparesis: Secondary | ICD-10-CM | POA: Diagnosis present

## 2021-08-29 DIAGNOSIS — K5900 Constipation, unspecified: Secondary | ICD-10-CM | POA: Diagnosis not present

## 2021-08-29 DIAGNOSIS — Z8673 Personal history of transient ischemic attack (TIA), and cerebral infarction without residual deficits: Secondary | ICD-10-CM | POA: Diagnosis not present

## 2021-08-29 DIAGNOSIS — R0609 Other forms of dyspnea: Secondary | ICD-10-CM | POA: Diagnosis not present

## 2021-08-29 DIAGNOSIS — K648 Other hemorrhoids: Secondary | ICD-10-CM | POA: Diagnosis present

## 2021-08-29 DIAGNOSIS — I10 Essential (primary) hypertension: Secondary | ICD-10-CM | POA: Diagnosis present

## 2021-08-29 DIAGNOSIS — I739 Peripheral vascular disease, unspecified: Secondary | ICD-10-CM | POA: Diagnosis not present

## 2021-08-29 DIAGNOSIS — K3189 Other diseases of stomach and duodenum: Secondary | ICD-10-CM | POA: Diagnosis not present

## 2021-08-29 DIAGNOSIS — K626 Ulcer of anus and rectum: Secondary | ICD-10-CM | POA: Diagnosis present

## 2021-08-29 DIAGNOSIS — R0902 Hypoxemia: Secondary | ICD-10-CM | POA: Diagnosis not present

## 2021-08-29 DIAGNOSIS — D62 Acute posthemorrhagic anemia: Secondary | ICD-10-CM | POA: Diagnosis present

## 2021-08-29 DIAGNOSIS — K219 Gastro-esophageal reflux disease without esophagitis: Secondary | ICD-10-CM | POA: Diagnosis present

## 2021-08-29 DIAGNOSIS — K922 Gastrointestinal hemorrhage, unspecified: Secondary | ICD-10-CM | POA: Diagnosis present

## 2021-08-29 DIAGNOSIS — Z91030 Bee allergy status: Secondary | ICD-10-CM | POA: Diagnosis not present

## 2021-08-29 DIAGNOSIS — D649 Anemia, unspecified: Secondary | ICD-10-CM

## 2021-08-29 DIAGNOSIS — K5902 Outlet dysfunction constipation: Secondary | ICD-10-CM | POA: Diagnosis not present

## 2021-08-29 DIAGNOSIS — R195 Other fecal abnormalities: Secondary | ICD-10-CM | POA: Diagnosis not present

## 2021-08-29 DIAGNOSIS — I679 Cerebrovascular disease, unspecified: Secondary | ICD-10-CM

## 2021-08-29 DIAGNOSIS — Z888 Allergy status to other drugs, medicaments and biological substances status: Secondary | ICD-10-CM | POA: Diagnosis not present

## 2021-08-29 DIAGNOSIS — K6289 Other specified diseases of anus and rectum: Secondary | ICD-10-CM | POA: Diagnosis not present

## 2021-08-29 DIAGNOSIS — K621 Rectal polyp: Secondary | ICD-10-CM | POA: Diagnosis present

## 2021-08-29 DIAGNOSIS — F17210 Nicotine dependence, cigarettes, uncomplicated: Secondary | ICD-10-CM | POA: Diagnosis present

## 2021-08-29 DIAGNOSIS — R4182 Altered mental status, unspecified: Secondary | ICD-10-CM | POA: Diagnosis not present

## 2021-08-29 DIAGNOSIS — E785 Hyperlipidemia, unspecified: Secondary | ICD-10-CM | POA: Diagnosis present

## 2021-08-29 DIAGNOSIS — Z8541 Personal history of malignant neoplasm of cervix uteri: Secondary | ICD-10-CM | POA: Diagnosis not present

## 2021-08-29 DIAGNOSIS — D125 Benign neoplasm of sigmoid colon: Secondary | ICD-10-CM | POA: Diagnosis present

## 2021-08-29 DIAGNOSIS — G2581 Restless legs syndrome: Secondary | ICD-10-CM | POA: Diagnosis present

## 2021-08-29 DIAGNOSIS — K921 Melena: Secondary | ICD-10-CM | POA: Diagnosis present

## 2021-08-29 DIAGNOSIS — Z885 Allergy status to narcotic agent status: Secondary | ICD-10-CM | POA: Diagnosis not present

## 2021-08-29 DIAGNOSIS — Z8616 Personal history of COVID-19: Secondary | ICD-10-CM | POA: Diagnosis not present

## 2021-08-29 DIAGNOSIS — Z91048 Other nonmedicinal substance allergy status: Secondary | ICD-10-CM | POA: Diagnosis not present

## 2021-08-29 DIAGNOSIS — D500 Iron deficiency anemia secondary to blood loss (chronic): Secondary | ICD-10-CM | POA: Diagnosis not present

## 2021-08-29 DIAGNOSIS — R0600 Dyspnea, unspecified: Secondary | ICD-10-CM | POA: Diagnosis present

## 2021-08-29 DIAGNOSIS — K5909 Other constipation: Secondary | ICD-10-CM | POA: Diagnosis not present

## 2021-08-29 DIAGNOSIS — K5730 Diverticulosis of large intestine without perforation or abscess without bleeding: Secondary | ICD-10-CM | POA: Diagnosis present

## 2021-08-29 DIAGNOSIS — Q438 Other specified congenital malformations of intestine: Secondary | ICD-10-CM | POA: Diagnosis not present

## 2021-08-29 DIAGNOSIS — Z882 Allergy status to sulfonamides status: Secondary | ICD-10-CM | POA: Diagnosis not present

## 2021-08-29 LAB — COMPREHENSIVE METABOLIC PANEL
ALT: 14 U/L (ref 0–44)
AST: 22 U/L (ref 15–41)
Albumin: 2.8 g/dL — ABNORMAL LOW (ref 3.5–5.0)
Alkaline Phosphatase: 100 U/L (ref 38–126)
Anion gap: 4 — ABNORMAL LOW (ref 5–15)
BUN: 7 mg/dL — ABNORMAL LOW (ref 8–23)
CO2: 25 mmol/L (ref 22–32)
Calcium: 8 mg/dL — ABNORMAL LOW (ref 8.9–10.3)
Chloride: 105 mmol/L (ref 98–111)
Creatinine, Ser: 0.71 mg/dL (ref 0.44–1.00)
GFR, Estimated: >60 mL/min (ref >60)
Glucose, Bld: 104 mg/dL — ABNORMAL HIGH (ref 70–99)
Potassium: 3.5 mmol/L (ref 3.5–5.1)
Sodium: 134 mmol/L — ABNORMAL LOW (ref 135–145)
Total Bilirubin: 0.8 mg/dL (ref 0.3–1.2)
Total Protein: 5.6 g/dL — ABNORMAL LOW (ref 6.5–8.1)

## 2021-08-29 LAB — HEMOGLOBIN AND HEMATOCRIT, BLOOD
HCT: 27 % — ABNORMAL LOW (ref 36.0–46.0)
Hemoglobin: 7.9 g/dL — ABNORMAL LOW (ref 12.0–15.0)

## 2021-08-29 LAB — CBC
HCT: 22.6 % — ABNORMAL LOW (ref 36.0–46.0)
Hemoglobin: 6.4 g/dL — CL (ref 12.0–15.0)
MCH: 19.6 pg — ABNORMAL LOW (ref 26.0–34.0)
MCHC: 28.3 g/dL — ABNORMAL LOW (ref 30.0–36.0)
MCV: 69.1 fL — ABNORMAL LOW (ref 80.0–100.0)
Platelets: 211 10*3/uL (ref 150–400)
RBC: 3.27 MIL/uL — ABNORMAL LOW (ref 3.87–5.11)
RDW: 22.9 % — ABNORMAL HIGH (ref 11.5–15.5)
WBC: 7.9 10*3/uL (ref 4.0–10.5)
nRBC: 0.4 % — ABNORMAL HIGH (ref 0.0–0.2)

## 2021-08-29 LAB — PREPARE RBC (CROSSMATCH)

## 2021-08-29 MED ORDER — METHYLNALTREXONE BROMIDE 12 MG/0.6ML ~~LOC~~ SOLN
12.0000 mg | Freq: Once | SUBCUTANEOUS | Status: AC
  Administered 2021-08-29: 12 mg via SUBCUTANEOUS
  Filled 2021-08-29: qty 0.6

## 2021-08-29 MED ORDER — OXYCODONE-ACETAMINOPHEN 10-325 MG PO TABS
1.0000 | ORAL_TABLET | Freq: Four times a day (QID) | ORAL | Status: DC | PRN
Start: 2021-08-29 — End: 2021-08-29

## 2021-08-29 MED ORDER — DULOXETINE HCL 60 MG PO CPEP
60.0000 mg | ORAL_CAPSULE | Freq: Every day | ORAL | Status: DC
  Administered 2021-08-29 – 2021-09-01 (×3): 60 mg via ORAL
  Filled 2021-08-29: qty 2
  Filled 2021-08-29 (×2): qty 1

## 2021-08-29 MED ORDER — PANTOPRAZOLE SODIUM 40 MG IV SOLR
40.0000 mg | Freq: Two times a day (BID) | INTRAVENOUS | Status: DC
  Administered 2021-08-30: 40 mg via INTRAVENOUS
  Filled 2021-08-29: qty 10

## 2021-08-29 MED ORDER — SODIUM CHLORIDE 0.9% IV SOLUTION: Freq: Once | INTRAVENOUS | Status: DC

## 2021-08-29 MED ORDER — NICOTINE 21 MG/24HR TD PT24
21.0000 mg | MEDICATED_PATCH | Freq: Every day | TRANSDERMAL | Status: DC
  Administered 2021-08-29 – 2021-09-01 (×4): 21 mg via TRANSDERMAL
  Filled 2021-08-29 (×4): qty 1

## 2021-08-29 MED ORDER — PNEUMOCOCCAL 20-VAL CONJ VACC 0.5 ML IM SUSY: 0.5000 mL | PREFILLED_SYRINGE | INTRAMUSCULAR | Status: DC | PRN

## 2021-08-29 MED ORDER — ROSUVASTATIN CALCIUM 5 MG PO TABS
10.0000 mg | ORAL_TABLET | Freq: Every day | ORAL | Status: DC
  Administered 2021-08-29 – 2021-09-01 (×4): 10 mg via ORAL
  Filled 2021-08-29 (×4): qty 2

## 2021-08-29 MED ORDER — OXYCODONE-ACETAMINOPHEN 5-325 MG PO TABS
1.0000 | ORAL_TABLET | Freq: Four times a day (QID) | ORAL | Status: DC | PRN
  Administered 2021-08-29: 1 via ORAL
  Filled 2021-08-29: qty 1

## 2021-08-29 MED ORDER — TRAZODONE HCL 50 MG PO TABS
25.0000 mg | ORAL_TABLET | Freq: Every evening | ORAL | Status: DC | PRN
  Administered 2021-08-29 – 2021-08-31 (×3): 50 mg via ORAL
  Filled 2021-08-29 (×3): qty 1

## 2021-08-29 MED ORDER — ALBUTEROL SULFATE (2.5 MG/3ML) 0.083% IN NEBU
3.0000 mL | INHALATION_SOLUTION | RESPIRATORY_TRACT | Status: DC | PRN
Start: 2021-08-29 — End: 2021-09-01
  Administered 2021-08-29 – 2021-08-31 (×3): 3 mL via RESPIRATORY_TRACT
  Filled 2021-08-29 (×3): qty 3

## 2021-08-29 MED ORDER — SORBITOL 70 % SOLN
960.0000 mL | TOPICAL_OIL | Freq: Once | ORAL | Status: AC
  Administered 2021-08-29: 960 mL via RECTAL
  Filled 2021-08-29: qty 473

## 2021-08-29 MED ORDER — BUSPIRONE HCL 5 MG PO TABS
5.0000 mg | ORAL_TABLET | Freq: Every day | ORAL | Status: DC
  Administered 2021-08-29 – 2021-08-31 (×3): 5 mg via ORAL
  Filled 2021-08-29 (×3): qty 1

## 2021-08-29 MED ORDER — METHOCARBAMOL 500 MG PO TABS
500.0000 mg | ORAL_TABLET | Freq: Every day | ORAL | Status: DC
  Administered 2021-08-29 – 2021-08-31 (×3): 500 mg via ORAL
  Filled 2021-08-29 (×3): qty 1

## 2021-08-29 MED ORDER — OXYCODONE HCL 5 MG PO TABS
5.0000 mg | ORAL_TABLET | Freq: Four times a day (QID) | ORAL | Status: DC | PRN
  Administered 2021-08-29: 5 mg via ORAL
  Filled 2021-08-29: qty 1

## 2021-08-29 MED ORDER — ROPINIROLE HCL 1 MG PO TABS
1.0000 mg | ORAL_TABLET | Freq: Three times a day (TID) | ORAL | Status: DC
  Administered 2021-08-29 (×4): 1 mg via ORAL
  Filled 2021-08-29 (×5): qty 1

## 2021-08-29 MED ORDER — ROPINIROLE HCL 1 MG PO TABS: 1.0000 mg | ORAL_TABLET | Freq: Three times a day (TID) | ORAL | Status: DC

## 2021-08-29 NOTE — ED Notes (Signed)
Patient started on 2L [place] due to low oxygen saturation

## 2021-08-29 NOTE — ED Notes (Signed)
Patient transported to CT 

## 2021-08-29 NOTE — ED Notes (Signed)
Patient appears restless. She states its because she has not taken her medication for restless leg. Patient was helped to the side of the bed for comfort.

## 2021-08-29 NOTE — Assessment & Plan Note (Addendum)
Unclear if baseline or new confusion, suspect due to acute illness and possible component of vascular dementia. CT head wnl. - Would review medication list, consider discontinuation of trazodone  - Will discuss with husband

## 2021-08-29 NOTE — ED Notes (Signed)
Patient is asleep in bed

## 2021-08-29 NOTE — ED Notes (Signed)
Patient is very restless an unable to sit still. Patient states she needs her medication for restless leg and nicotine patch.

## 2021-08-29 NOTE — ED Notes (Signed)
Patient placed on bedside commode.

## 2021-08-29 NOTE — Progress Notes (Signed)
Daily Progress Note Intern Pager: [phone]  Patient name: [name] Medical record number: [id number] Date of birth: 06/15/1950 Age: 71 y.o. Gender: female  Primary Care Provider: Alvester Chou, NP Consultants: GI Code Status: FULL  Pt Overview and Major Events to Date:  8/15 - admitted, 3u pRBC overnight  Assessment and Plan:  [name] is a 71 y.o. female presenting with constipation and abdominal pain and recent hx of melena for several weeks.  Had BRBPR x2 in the ED. Recent hx NSAID use, hx benign adeno polyps 73yrs ago, hx internal hemorrhoids. Unclear whether upper vs lower GI bleed vs both given mixed symptoms. DDx may include gastritis vs PUD vs diverticular disease vs hemorrhoids.   * GI bleed Unclear whether upper or lower GI given mixed symptoms. ABLA with Hemoglobin of 5.3 on admission. Now s/p 3u pRBCs and 1L NS bolus. VSS, normotensive to hypertensive. - GI consulted, appreciate recs - IV Protonix 40mg  BID - AM CBC, CMP - transfusion threshold 7 - Abdominal x-ray consistent with large stool burden, no signs of obstruction - N.p.o. sips with meds, anticipate endoscopy  Altered mental state Unclear if baseline or new confusion, suspect due to acute illness and possible component of vascular dementia - Would review medication list, consider discontinuation of trazodone  - Given headache (was taking Advil/Aleve for this) will obtain CT head - Will discuss with husband   Dyspnea Pt p/w some SOB with cough. Denied chest pain. Normal WBC count. Symptoms thought to be most likely related to her anemia. EKG, CXR unremarkable.  -Continue to monitor, rpt CXR if worsening or febrile -Acute hypoxemia, while sleeping, suspect possible OSA, monitor for signs of infection      FEN/GI: NPO since MN for possible endoscopy PPx: none Dispo:Home pending clinical improvement . Barriers include GI eval.   Subjective:  Pt feeling well this morning. Has not had  additional BM. Denies current headache/dizziness. Endorses some abdominal pain. Denies N/V, CP/SOB.   Objective: Temp:  [98 F (36.7 C)-99 F (37.2 C)] 98.1 F (36.7 C) (08/16 0906) Pulse Rate:  [81-109] 83 (08/16 0902) Resp:  [12-33] 20 (08/16 0902) BP: (106-178)/(40-123) 169/75 (08/16 0902) SpO2:  [86 %-100 %] 100 % (08/16 0902) Physical Exam: General: Sitting up in bed, alert, conversational Cardiovascular: RRR Respiratory: CTAB, normal WOB on supplemental O2 Abdomen: soft, moderate diffuse tenderness to palpation Extremities: Moves all extremities  Laboratory: Most recent CBC Lab Results  Component Value Date   WBC 7.9 08/29/2021   HGB 6.4 (LL) 08/29/2021   HCT 22.6 (L) 08/29/2021   MCV 69.1 (L) 08/29/2021   PLT 211 08/29/2021   Most recent BMP    Latest Ref Rng & Units 08/29/2021    3:55 AM  BMP  Glucose 70 - 99 mg/dL 104   BUN 8 - 23 mg/dL 7   Creatinine 0.44 - 1.00 mg/dL 0.71   Sodium 135 - 145 mmol/L 134   Potassium 3.5 - 5.1 mmol/L 3.5   Chloride 98 - 111 mmol/L 105   CO2 22 - 32 mmol/L 25   Calcium 8.9 - 10.3 mg/dL 8.0    FOBT +  Imaging/Diagnostic Tests:  CXR Radiologist impression:  Abdominal x-ray Impression from Radiologist:  1. Nonobstructive bowel gas pattern. 2. Large amount of stool throughout the colon and within the rectum   [name] Albino, MD 08/29/2021, 11:28 AM  PGY-1, [place] Intern pager: [phone], text pages welcome Secure chat group [facility] Teaching Service

## 2021-08-29 NOTE — ED Notes (Signed)
ED TO INPATIENT HANDOFF REPORT  ED Nurse Name and Phone #: Mel Almond [phone]  S Name/Age/Gender [name] 71 y.o. female Room/Bed: 017C/017C  Code Status   Code Status: Full Code  Home/SNF/Other Home Patient oriented to: self, place, time, and situation Is this baseline? Yes   Triage Complete: Triage complete  Chief Complaint GI bleed [K92.2]  Triage Note Pt here with reports of constipation. LBM 8 days ago. Pt has tried OTC meds without relief. Called her GI doctor who referred her to the ED.    Allergies Allergies  Allergen Reactions   Bee Venom Anaphylaxis   Morphine And Related Other (See Comments)    Overly sedated   Neurontin [Gabapentin] Other (See Comments)    Nightmares    Codeine Hives   Adhesive [Tape] Itching and Other (See Comments)    Redness, Paper tape is ok   Sulfa Antibiotics Other (See Comments)    Childhood allergy Unknown reaction   Sulfonamide Derivatives Other (See Comments)    Childhood allergy Unknown reaction    Level of Care/Admitting Diagnosis ED Disposition     ED Disposition  Admit   Condition  --   [facility]: [place] [[id number]]  Level of Care: Med-Surg [16]  May place patient in observation at [facility] or [place] if equivalent level of care is available:: Yes  Covid Evaluation: Asymptomatic - no recent exposure (last 10 days) testing not required  Diagnosis: GI bleed [951884]  Admitting Physician: [place] Dales [[id number]]  Attending Physician: Martyn Malay [[id number]]          B Medical/Surgery History Past Medical History:  Diagnosis Date   Anxiety    Aortic atherosclerosis ([place])    Bilateral cataracts    Carpal tunnel syndrome    Cervical cancer (HCC)    Chronic back pain    Claudication ([place])    COPD (chronic obstructive pulmonary disease) ([place])    wheezing   DDD (degenerative disc disease), cervical    DDD (degenerative disc disease), lumbar    Depression     Diverticulosis    Gastroparesis    GERD (gastroesophageal reflux disease)    Hearing loss    Bilateral   History of blood transfusion    History of colon polyps    History of migraine    Hypertension    Internal hemorrhoids    Iron deficiency anemia    Left-sided weakness    Leg swelling    Liver disease    pt unaware   Lung nodule    several small   OA (osteoarthritis)    both hands   Positive ANA (antinuclear antibody)    Pre-diabetes    Raynaud's phenomenon    Restless leg    Seizures (HCC)    no meds for 8 months   Sleep apnea    does not use her cpap   Spondylosis    Stroke ([place]) 2015   no weakness   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   rt   Urge incontinence of urine    Varicose vein of leg    Venous insufficiency    Past Surgical History:  Procedure Laterality Date   BACK SURGERY     BLEPHAROPLASTY  10/10/2017   CATARACT EXTRACTION, BILATERAL     CESAREAN SECTION     CHOLECYSTECTOMY     COLONOSCOPY  10/2016   HAND SURGERY Bilateral    carpal tunnel   HEMORRHOID SURGERY  INTERSTIM IMPLANT PLACEMENT     KNEE SURGERY Left    arthroscopy x3   LOWER EXTREMITY ANGIOGRAPHY N/A 07/16/2016   Procedure: Lower Extremity Angiography;  Surgeon: Adrian Prows, MD;  Location: [facility];  Service: Cardiovascular;  Laterality: N/A;   LOWER EXTREMITY INTERVENTION N/A 08/06/2016   Procedure: Lower Extremity Intervention;  Surgeon: Adrian Prows, MD;  Location: [facility];  Service: Cardiovascular;  Laterality: N/A;   NECK SURGERY     OTHER SURGICAL HISTORY  2013   bladder stimulator in back   PERIPHERAL VASCULAR BALLOON ANGIOPLASTY  08/06/2016   Procedure: Peripheral Vascular Balloon Angioplasty;  Surgeon: Adrian Prows, MD;  Location: [facility];  Service: Cardiovascular;;  Right SFA   TENNIS ELBOW RELEASE/NIRSCHEL PROCEDURE Right 12/2015   TOE SURGERY Right    right foot second and fourth toe   TOTAL HIP ARTHROPLASTY Left    TOTAL HIP REVISION Left  10/13/2017   Procedure: LEFT TOTAL HIP REVISION ACETABULUM, OPEN REDUCTION INTERNAL WITH BONE GRAFT FIXATION LEFT GREATER TROCHANTERIC FRACTURE;  Surgeon: Frederik Pear, MD;  Location: [facility];  Service: Orthopedics;  Laterality: Left;   TOTAL KNEE ARTHROPLASTY Right 01/12/2016   Procedure: RIGHT TOTAL KNEE ARTHROPLASTY;  Surgeon: Netta Cedars, MD;  Location: [facility];  Service: Orthopedics;  Laterality: Right;   TUBAL LIGATION     UPPER GI ENDOSCOPY  10/2016   VAGINAL HYSTERECTOMY       A IV Location/Drains/Wounds Patient Lines/Drains/Airways Status     Active Line/Drains/Airways     Name Placement date Placement time Site Days   Peripheral IV 08/28/21 20 G Anterior;Proximal;Right Forearm 08/28/21  2028  Forearm  1   Peripheral IV 08/28/21 20 G Anterior;Right Wrist 08/28/21  2156  Wrist  1   Incision (Closed) 10/13/17 Hip Left 10/13/17  0949  -- 1416            Intake/Output Last 24 hours  Intake/Output Summary (Last 24 hours) at 08/29/2021 1220 Last data filed at 08/29/2021 2409 Gross per 24 hour  Intake 660 ml  Output --  Net 660 ml    Labs/Imaging Results for orders placed or performed during the hospital encounter of 08/28/21 (from the past 48 hour(s))  CBC with Differential     Status: Abnormal   Collection Time: 08/28/21  4:38 PM  Result Value Ref Range   WBC 10.0 4.0 - 10.5 K/uL   RBC 3.11 (L) 3.87 - 5.11 MIL/uL   Hemoglobin 5.3 (LL) 12.0 - 15.0 g/dL    Comment: Reticulocyte Hemoglobin testing may be clinically indicated, consider ordering this additional test [id number] THIS CRITICAL RESULT HAS VERIFIED AND BEEN CALLED TO C.BAIN,RN BY JOHN VANG ON 08 15 2023 AT 1737, AND HAS BEEN READ BACK.  REPEATED TO VERIFY CORRECTED ON 08/15 AT 2238: PREVIOUSLY REPORTED AS 5.3 Reticulocyte Hemoglobin testing may be clinically indicated, consider ordering this additional test [id number] THIS CRITICAL RESULT HAS VERIFIED AND BEEN CALLED TO C.BAIN,RN BY JOHN VANG ON 08 15 2023  AT  1737, AND HAS BEEN READ BACK.     HCT 20.5 (L) 36.0 - 46.0 %   MCV 65.9 (L) 80.0 - 100.0 fL   MCH 17.0 (L) 26.0 - 34.0 pg   MCHC 25.9 (L) 30.0 - 36.0 g/dL   RDW 20.0 (H) 11.5 - 15.5 %   Platelets 267 150 - 400 K/uL   nRBC 0.2 0.0 - 0.2 %   Neutrophils Relative % 86 %   Neutro Abs 8.6 (  H) 1.7 - 7.7 K/uL   Lymphocytes Relative 10 %   Lymphs Abs 1.0 0.7 - 4.0 K/uL   Monocytes Relative 2 %   Monocytes Absolute 0.2 0.1 - 1.0 K/uL   Eosinophils Relative 2 %   Eosinophils Absolute 0.2 0.0 - 0.5 K/uL   Basophils Relative 0 %   Basophils Absolute 0.0 0.0 - 0.1 K/uL   nRBC 0 0 /100 WBC   Abs Immature Granulocytes 0.00 0.00 - 0.07 K/uL   Polychromasia PRESENT     Comment: Performed at [facility] [address] [place], [place] [address]  Basic metabolic panel     Status: Abnormal   Collection Time: 08/28/21  4:38 PM  Result Value Ref Range   Sodium 134 (L) 135 - 145 mmol/L   Potassium 4.0 3.5 - 5.1 mmol/L   Chloride 104 98 - 111 mmol/L   CO2 23 22 - 32 mmol/L   Glucose, Bld 105 (H) 70 - 99 mg/dL    Comment: Glucose reference range applies only to samples taken after fasting for at least 8 hours.   BUN 8 8 - 23 mg/dL   Creatinine, Ser 0.69 0.44 - 1.00 mg/dL   Calcium 8.5 (L) 8.9 - 10.3 mg/dL   GFR, Estimated >60 >60 mL/min    Comment: (NOTE) Calculated using the CKD-EPI Creatinine Equation (2021)    Anion gap 7 5 - 15    Comment: Performed at [facility] [address], [place], [place] [address]  Hemoglobin and hematocrit, blood     Status: Abnormal   Collection Time: 08/28/21  8:23 PM  Result Value Ref Range   Hemoglobin 5.5 (LL) 12.0 - 15.0 g/dL    Comment: REPEATED TO VERIFY THIS CRITICAL RESULT HAS VERIFIED AND BEEN CALLED TO RN NATASHA BY PAMELA HENDERSON ON 08 15 2023 AT 2102, AND HAS BEEN READ BACK.     HCT 21.6 (L) 36.0 - 46.0 %    Comment: Performed at [facility], [place] [address] [place], [place] [address]  Type and screen [place]     Status: None (Preliminary result)   Collection Time: 08/28/21  8:25 PM  Result Value Ref Range   ABO/RH(D) O POS    Antibody Screen NEG    Sample Expiration 08/31/2021,2359    Unit Number [id number]    Blood Component Type RED CELLS,LR    Unit division 00    Status of Unit ISSUED,FINAL    Transfusion Status OK TO TRANSFUSE    Crossmatch Result      Compatible Performed at [facility], [place] [address] [place], [place] [address]    Unit Number [id number]    Blood Component Type RED CELLS,LR    Unit division 00    Status of Unit ISSUED    Transfusion Status OK TO TRANSFUSE    Crossmatch Result Compatible    Unit Number [id number]    Blood Component Type RED CELLS,LR    Unit division 00    Status of Unit ISSUED    Transfusion Status OK TO TRANSFUSE    Crossmatch Result Compatible   POC occult blood, ED     Status: Abnormal   Collection Time: 08/28/21  8:31 PM  Result Value Ref Range   Fecal Occult Bld POSITIVE (A) NEGATIVE  Prepare RBC (crossmatch)     Status: None   Collection Time: 08/28/21  8:49 PM  Result Value Ref Range   Order  Confirmation      ORDER PROCESSED BY BLOOD BANK Performed at [facility], [place] [address], [place], [place] [address]   CBC     Status: Abnormal   Collection Time: 08/29/21  3:55 AM  Result Value Ref Range   WBC 7.9 4.0 - 10.5 K/uL   RBC 3.27 (L) 3.87 - 5.11 MIL/uL   Hemoglobin 6.4 (LL) 12.0 - 15.0 g/dL    Comment: REPEATED TO VERIFY THIS CRITICAL RESULT HAS VERIFIED AND BEEN CALLED TO RN NATASHA BY PAMELA HENDERSON ON 08 16 2023 AT 0500, AND HAS BEEN READ BACK.     HCT 22.6 (L) 36.0 - 46.0 %   MCV 69.1 (L) 80.0 - 100.0 fL   MCH 19.6 (L) 26.0 - 34.0 pg   MCHC 28.3 (L) 30.0 - 36.0 g/dL   RDW 22.9 (H) 11.5 - 15.5 %   Platelets 211 150 - 400 K/uL   nRBC 0.4 (H) 0.0 - 0.2 %    Comment: Performed at [facility] [address], [place], [place] [address]  Comprehensive metabolic panel     Status:  Abnormal   Collection Time: 08/29/21  3:55 AM  Result Value Ref Range   Sodium 134 (L) 135 - 145 mmol/L   Potassium 3.5 3.5 - 5.1 mmol/L   Chloride 105 98 - 111 mmol/L   CO2 25 22 - 32 mmol/L   Glucose, Bld 104 (H) 70 - 99 mg/dL    Comment: Glucose reference range applies only to samples taken after fasting for at least 8 hours.   BUN 7 (L) 8 - 23 mg/dL   Creatinine, Ser 0.71 0.44 - 1.00 mg/dL   Calcium 8.0 (L) 8.9 - 10.3 mg/dL   Total Protein 5.6 (L) 6.5 - 8.1 g/dL   Albumin 2.8 (L) 3.5 - 5.0 g/dL   AST 22 15 - 41 U/L   ALT 14 0 - 44 U/L   Alkaline Phosphatase 100 38 - 126 U/L   Total Bilirubin 0.8 0.3 - 1.2 mg/dL   GFR, Estimated >60 >60 mL/min    Comment: (NOTE) Calculated using the CKD-EPI Creatinine Equation (2021)    Anion gap 4 (L) 5 - 15    Comment: Performed at [facility], [place] [address], [place], [place] [address]  Prepare RBC (crossmatch)     Status: None   Collection Time: 08/29/21  6:13 AM  Result Value Ref Range   Order Confirmation      ORDER PROCESSED BY BLOOD BANK Performed at [facility], [place] [address] [place], [place] [address]    DG Chest Port 1 View  Result Date: 08/29/2021 CLINICAL DATA:  71 year old female with constipation and shortness of breath. EXAM: PORTABLE CHEST - 1 VIEW COMPARISON:  04/30/2021, 11/27/2017 FINDINGS: The patient is rotated to the right. Apical projection. The mediastinal contours are within normal limits. No cardiomegaly. The lungs are clear bilaterally without evidence of focal consolidation, pleural effusion, or pneumothorax. No acute osseous abnormality. IMPRESSION: No acute cardiopulmonary process. Electronically Signed   By: Ruthann Cancer M.D.   On: 08/29/2021 07:53   DG Abd 2 Views  Result Date: 08/28/2021 CLINICAL DATA:  Constipation. EXAM: ABDOMEN - 2 VIEW COMPARISON:  None Available. FINDINGS: The bowel gas pattern is normal. There is a large amount of stool throughout the entire colon. There is also a large  amount of stool in the rectum. There is no evidence of free air. No radio-opaque calculi. Cholecystectomy  clips are present. Left-sided sacral stimulator device is present. Left hip arthroplasty is present. IMPRESSION: 1. Nonobstructive bowel gas pattern. 2. Large amount of stool throughout the colon and within the rectum. Electronically Signed   By: Ronney Asters M.D.   On: 08/28/2021 17:43    Pending Labs Unresulted Labs (From admission, onward)     Start     Ordered   08/29/21 1117  Hemoglobin and hematocrit, blood  Once,   R        08/29/21 1116            Vitals/Pain Today's Vitals   08/29/21 0902 08/29/21 0906 08/29/21 1030 08/29/21 1220  BP: (!) 169/75  137/79   Pulse: 83  87   Resp: 20  16   Temp:  98.1 F (36.7 C)    TempSrc: Oral Oral    SpO2: 100%  100%   PainSc:    Asleep    Isolation Precautions No active isolations  Medications Medications  0.9 %  sodium chloride infusion (0 mL/hr Intravenous Stopped 08/29/21 1047)  acetaminophen (TYLENOL) tablet 650 mg (has no administration in time range)    Or  acetaminophen (TYLENOL) suppository 650 mg (has no administration in time range)  ondansetron (ZOFRAN) tablet 4 mg ( Oral See Alternative 08/29/21 0033)    Or  ondansetron (ZOFRAN) injection 4 mg (4 mg Intravenous Given 08/29/21 0033)  nicotine (NICODERM CQ - dosed in mg/24 hours) patch 21 mg (21 mg Transdermal Patch Applied 08/29/21 0858)  DULoxetine (CYMBALTA) DR capsule 60 mg (60 mg Oral Given 08/29/21 0854)  traZODone (DESYREL) tablet 25-50 mg (50 mg Oral Given 08/29/21 0033)  albuterol (PROVENTIL) (2.5 MG/3ML) 0.083% nebulizer solution 3 mL (has no administration in time range)  rOPINIRole (REQUIP) tablet 1 mg (1 mg Oral Given 08/29/21 1000)  pantoprazole (PROTONIX) injection 40 mg (has no administration in time range)  0.9 %  sodium chloride infusion (Manually program via Guardrails IV Fluids) (0 mLs Intravenous Stopped 08/29/21 1048)  rosuvastatin (CRESTOR) tablet  10 mg (10 mg Oral Given 08/29/21 1050)  busPIRone (BUSPAR) tablet 5 mg (has no administration in time range)  methocarbamol (ROBAXIN) tablet 500 mg (has no administration in time range)  sodium chloride 0.9 % bolus 1,000 mL (0 mLs Intravenous Stopped 08/29/21 0022)  fentaNYL (SUBLIMAZE) injection 75 mcg (75 mcg Intravenous Given 08/28/21 2146)  pantoprazole (PROTONIX) injection 40 mg (40 mg Intravenous Given 08/28/21 2145)    Mobility walks with person assist Moderate fall risk   Focused Assessments    R Recommendations: See Admitting Provider Note  Report given to:   Additional Notes: Post Transfusion H&H sent

## 2021-08-29 NOTE — ED Notes (Signed)
2W RN stated they are ready for the pt

## 2021-08-29 NOTE — Consult Note (Addendum)
[facility] Consult: 8:44 AM 08/29/2021  LOS: 0 days    Referring Provider: Dr Dorris Singh  Primary Care Physician:  Alvester Chou, NP Primary Gastroenterologist:  Dr. Henrene Pastor.  Last OV 10/21.      Reason for Consultation:  Anemia, Hgb 5.3.  Constipation.     HPI: [name] is a 71 y.o. female.  PMH obesity cervical cancer.  GERD.  Colon polyps (2003 HP polyp, 2018 TA polyp).  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  Gastroparesis.  Stroke.  Chronic opiates for pain mgt. + ANA.  PVD w angioplasty LE 2018. Multiple orthopedic surgeries. Chronic back pain.  S/p spinal cord stimulator placement.   Cholecystectomy.  C section.  Hemorrhoid suregery.  See list for numerouos other problems.   Takes 325 ASA daily, nightly aleve.     Several previous EGDs for chest/epigastric pain, IDA, dysphagia. 2003 esophageal manometry, normal.   Latest EGD 11/19/19: For IDA, dyspepsia, dysphagia.  Large volume retained gastric contents, suspect secondary narcotics.  Small pre-pyloric erosion.  Tortuous esophagus with fashionable distal esophageal, large caliber ring was not dilated due to the retained gastric contents 10/2016 Colonoscopy: For IDA.  Polyps removed, 3-4 mm size.  Multiple sigmoid tics.  Small, nonbleeding internal hemorrhoids.  Path: TA and fecal material.   10/2005 EUS for epigastric pain, elevated LFTs, dilated CBD.  Normal esophagus and duodenum.  Copious retained gastric limited complete visualization.  Slightly dilated CBD without stones.  Main PD normal. Pancreatic panchyma normal.  Surgically absent gallbladder.  No peripancreatic or celiac disease.  Limited but normal views of the liver, spleen, splenic, PVD.  Retained food felt contributing to epigastric pains, Reglan was increased Urease negative 2018.   Does not appear to have had biopsy for celiac dz.  No thyroid dz.  07/2019 GES: delayed gastric emptying.  42% emptied at 4 hurs (normal is >90%)  For many months patient's had progressive constipation.  In the past would have bowel movements every morning after coffee but in recent months, despite using stimulant laxatives, stool softeners daily, she only managed to have bowel movements every 3 to 5 days.  These are often small pebbles, small volume, brown.  She has seen black stools which she attributes to using Tums.  She has been using Tums 5-6 times a day.  Its been a week since she had a bowel movement.  No results from using a fleets enema, unable to retain the contents of the enema which splashed out as soon as she administered.  Yesterday saw a small amount of blood but no stool.  No severe abdominal pain just a sense of bloating.  Also has frequent "sour stomach", the reason for the Tums.  Not taking Protonix which is prescribed "as needed".  Compliant with Reglan.  Describes periodic dysphagia to liquids and solids, especially when she is anxious.  Burps a lot and if she is not burping she is passing gas.  Uses 1-2 oxycodone daily.  Takes an Aleve at night.  Scribes progressive fatigue, weakness.  Not very  active but experiencing dyspnea on exertion.  Does not drink a lot of sweetened beverages.  Although its not unusual for her to go a week without a bowel movement, she did not feel well and got concerned so came to the ED yesterday. FOBT +.   Hgb 5.3.Marland KitchenMarland Kitchen 2 PRBCs.. 6.4.. 3rd PRBC,  14 in Feb 2023. 9.8 in Sept 2022.  MCV 69.  BUN low, 7.  Normal creat.  Na 134.   Albumin low 2.8.  O/w normal LFTs.   2V abdomen: Nonobstructive bowel gas pattern.  Large volume stool throughout the colon and rectum  Lives with her husband who is in fair health.  A 23 year old granddaughter lives with the patient and spouse.  The child's mother lives in [place] and her living situation is not stable.  Lives in  [place].  Smokes 1 pack/day.  No alcohol. Family history without any ulcer disease, anemia, cancers.  Her mother died of heart disease.  She is not sure for the cause of her father's death.  Past Medical History:  Diagnosis Date   Anxiety    Aortic atherosclerosis (HCC)    Bilateral cataracts    Carpal tunnel syndrome    Cervical cancer (HCC)    Chronic back pain    Claudication (HCC)    COPD (chronic obstructive pulmonary disease) (HCC)    wheezing   DDD (degenerative disc disease), cervical    DDD (degenerative disc disease), lumbar    Depression    Diverticulosis    Gastroparesis    GERD (gastroesophageal reflux disease)    Hearing loss    Bilateral   History of blood transfusion    History of colon polyps    History of migraine    Hypertension    Internal hemorrhoids    Iron deficiency anemia    Left-sided weakness    Leg swelling    Liver disease    pt unaware   Lung nodule    several small   OA (osteoarthritis)    both hands   Positive ANA (antinuclear antibody)    Pre-diabetes    Raynaud's phenomenon    Restless leg    Seizures (HCC)    no meds for 8 months   Sleep apnea    does not use her cpap   Spondylosis    Stroke ([place]) 2015   no weakness   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   rt   Urge incontinence of urine    Varicose vein of leg    Venous insufficiency     Past Surgical History:  Procedure Laterality Date   BACK SURGERY     BLEPHAROPLASTY  10/10/2017   CATARACT EXTRACTION, BILATERAL     CESAREAN SECTION     CHOLECYSTECTOMY     COLONOSCOPY  10/2016   HAND SURGERY Bilateral    carpal tunnel   HEMORRHOID SURGERY     INTERSTIM IMPLANT PLACEMENT     KNEE SURGERY Left    arthroscopy x3   LOWER EXTREMITY ANGIOGRAPHY N/A 07/16/2016   Procedure: Lower Extremity Angiography;  Surgeon: Adrian Prows, MD;  Location: [facility];  Service: Cardiovascular;  Laterality: N/A;   LOWER EXTREMITY INTERVENTION N/A 08/06/2016   Procedure: Lower  Extremity Intervention;  Surgeon: Adrian Prows, MD;  Location: [facility];  Service: Cardiovascular;  Laterality: N/A;   NECK SURGERY     OTHER SURGICAL HISTORY  2013   bladder stimulator in back   PERIPHERAL VASCULAR BALLOON ANGIOPLASTY  08/06/2016  Procedure: Peripheral Vascular Balloon Angioplasty;  Surgeon: Adrian Prows, MD;  Location: [facility];  Service: Cardiovascular;;  Right SFA   TENNIS ELBOW RELEASE/NIRSCHEL PROCEDURE Right 12/2015   TOE SURGERY Right    right foot second and fourth toe   TOTAL HIP ARTHROPLASTY Left    TOTAL HIP REVISION Left 10/13/2017   Procedure: LEFT TOTAL HIP REVISION ACETABULUM, OPEN REDUCTION INTERNAL WITH BONE GRAFT FIXATION LEFT GREATER TROCHANTERIC FRACTURE;  Surgeon: Frederik Pear, MD;  Location: [facility];  Service: Orthopedics;  Laterality: Left;   TOTAL KNEE ARTHROPLASTY Right 01/12/2016   Procedure: RIGHT TOTAL KNEE ARTHROPLASTY;  Surgeon: Netta Cedars, MD;  Location: [facility];  Service: Orthopedics;  Laterality: Right;   TUBAL LIGATION     UPPER GI ENDOSCOPY  10/2016   VAGINAL HYSTERECTOMY      Prior to Admission medications   Medication Sig Start Date End Date Taking? Authorizing Provider  aspirin 325 MG EC tablet Take 325 mg by mouth daily.   Yes [provider]  busPIRone (BUSPAR) 5 MG tablet Take 1 tablet (5 mg total) by mouth 2 (two) times daily. Patient taking differently: Take 5 mg by mouth at bedtime. 02/18/18  Yes Ursula Alert, MD  Cholecalciferol (VITAMIN D-3 PO) Take 1 capsule by mouth daily.   Yes [provider]  Cyanocobalamin 1000 MCG/ML KIT Inject 1,000 mcg into the muscle every 30 (thirty) days.   Yes [provider]  DULoxetine (CYMBALTA) 60 MG capsule Take 60 mg by mouth at bedtime.   Yes [provider]  methocarbamol (ROBAXIN) 500 MG tablet Take 1 tablet (500 mg total) by mouth every 6 (six) hours as needed for muscle spasms. Patient taking differently: Take 500 mg by mouth at  bedtime. 10/13/17  Yes Leighton Parody, PA-C  metoCLOPramide (REGLAN) 5 MG tablet Take 1 tablet by mouth 3 times daily 20-30 minutes before meals NO FURTHER REFILLS UNTIL SEEN, NEEDS AND APPOINTMENT Patient taking differently: Take 5 mg by mouth daily before supper. 02/19/21  Yes Irene Shipper, MD  naproxen sodium (ALEVE) 220 MG tablet Take 220 mg by mouth daily as needed (pain).   Yes [provider]  nitroGLYCERIN (NITROSTAT) 0.4 MG SL tablet Place 0.4 mg under the tongue every 5 (five) minutes x 3 doses as needed for chest pain. 05/17/16  Yes [provider]  Oxycodone HCl 10 MG TABS Take 10 mg by mouth See admin instructions. 10 mg every night at bedtime. Takes an additional 10 mg if needed during the day for pain 08/13/21  Yes [provider]  pantoprazole (PROTONIX) 40 MG tablet Take 40 mg by mouth daily as needed (acid reflux).   Yes [provider]  rOPINIRole (REQUIP) 1 MG tablet Take 1 mg by mouth 2 (two) times daily.   Yes [provider]  rosuvastatin (CRESTOR) 10 MG tablet Take 10 mg by mouth daily. 06/10/20  Yes [provider]  traZODone (DESYREL) 50 MG tablet TAKE 0.5-1 TABLETS (25-50 MG TOTAL) BY MOUTH AT BEDTIME AS NEEDED FOR SLEEP. Patient taking differently: Take 25 mg by mouth at bedtime. 03/16/18  Yes Ursula Alert, MD    Scheduled Meds:  sodium chloride   Intravenous Once   DULoxetine  60 mg Oral QPC breakfast   nicotine  21 mg Transdermal Daily   [START ON 08/30/2021] pantoprazole (PROTONIX) IV  40 mg Intravenous Q12H   rOPINIRole  1 mg Oral TID   Infusions:  sodium chloride Stopped (08/28/21 2157)  PRN Meds: acetaminophen **OR** acetaminophen, albuterol, ondansetron **OR** ondansetron (ZOFRAN) IV, oxyCODONE-acetaminophen **AND** oxyCODONE, traZODone   Allergies as of 08/28/2021 - Review Complete 08/28/2021  Allergen Reaction Noted   Bee venom Anaphylaxis 12/28/2015   Gabapentin Other (See Comments) 10/14/2017    Morphine and related Other (See Comments) 07/12/2016   Codeine Hives 02/20/2017   Adhesive [tape] Other (See Comments) 08/02/2016   Sulfa antibiotics Other (See Comments) 09/02/2014   Sulfonamide derivatives Other (See Comments) 02/20/2017    Family History  Problem Relation Age of Onset   Hyperlipidemia Mother    Hypertension Mother    Anxiety disorder Mother    Depression Mother    Hyperlipidemia Father    Hypertension Father    Alzheimer's disease Father    Anxiety disorder Brother    Cancer Daughter        unknown   Anxiety disorder Brother    Anxiety disorder Brother    Drug abuse Brother    Stroke Brother    Colon cancer Neg Hx    Esophageal cancer Neg Hx    Rectal cancer Neg Hx    Stomach cancer Neg Hx     Social History   Socioeconomic History   Marital status: Married    Spouse name: sammy   Number of children: 1   Years of education: Not on file   Highest education level: Associate degree: occupational, Hotel manager, or vocational program  Occupational History   Not on file  Tobacco Use   Smoking status: Every Day    Packs/day: 1.00    Years: 55.00    Total pack years: 55.00    Types: Cigarettes   Smokeless tobacco: Never  Vaping Use   Vaping Use: Never used  Substance and Sexual Activity   Alcohol use: No    Alcohol/week: 0.0 standard drinks of alcohol   Drug use: Not Currently   Sexual activity: Not Currently    Birth control/protection: Post-menopausal, Surgical    Comment: hyst  Other Topics Concern   Not on file  Social History Narrative   Not on file   Social Determinants of Health   Financial Resource Strain: Medium Risk (04/14/2020)   Overall Financial Resource Strain (CARDIA)    Difficulty of Paying Living Expenses: Somewhat hard  Food Insecurity: No Food Insecurity (04/14/2020)   Hunger Vital Sign    Worried About Running Out of Food in the Last Year: Never true    Ran Out of Food in the Last Year: Never true  Transportation Needs: No  Transportation Needs (04/14/2020)   PRAPARE - Hydrologist (Medical): No    Lack of Transportation (Non-Medical): No  Physical Activity: Unknown (04/14/2020)   Exercise Vital Sign    Days of Exercise per Week: Not on file    Minutes of Exercise per Session: 30 min  Stress: No Stress Concern Present (04/14/2020)   [place]    Feeling of Stress : Only a little  Social Connections: Moderately Integrated (04/14/2020)   Social Connection and Isolation Panel [NHANES]    Frequency of Communication with Friends and Family: More than three times a week    Frequency of Social Gatherings with Friends and Family: Once a week    Attends Religious Services: More than 4 times per year    Active Member of [employer] or Organizations: No    Attends Archivist Meetings: Never    Marital Status: Married  Intimate  Partner Violence: Not At Risk (04/14/2020)   Humiliation, Afraid, Rape, and Kick questionnaire    Fear of Current or Ex-Partner: No    Emotionally Abused: No    Physically Abused: No    Sexually Abused: No    REVIEW OF SYSTEMS: Constitutional: Some fatigue and weakness not profound. ENT:  No nose bleeds.  "  [place]" Pulm: Stable cough with occasional production of clear sputum.  Dyspnea with activity. CV:  No palpitations, no angina.  Lower extremity edema when she has been on her feet for a long time, resolves overnight. GU:  No hematuria, no frequency GI: See HPI. Heme: Other than some minor rectal bleeding yesterday, has not had significant bleeding or bruising. Transfusions: Recalls blood transfusion in the 1970s around the time she had a ruptured gallbladder. Neuro: Describes dizziness and headache if she turns her head to the right.  She had scheduled appointment with ENT for evaluation but was not able to make it to this appointment.  + restlesss legs disrupting sleep.   Derm:  No  itching, no rash or sores.  Endocrine:  No sweats or chills.  No polyuria or dysuria Immunization: Reviewed. Travel:  Not queried   PHYSICAL EXAM: Vital signs in last 24 hours: Vitals:   08/29/21 0637 08/29/21 0652  BP: (!) 155/67 (!) 150/68  Pulse: 87 85  Resp: 15 12  Temp: 98 F (36.7 C) 98.5 F (36.9 C)  SpO2:  90%   Wt Readings from Last 3 Encounters:  03/21/21 94.3 kg  04/14/20 87.5 kg  03/03/20 82.6 kg    General: Comfortable, somewhat ill-appearing but not acutely ill appearing. Head: Nose facial asymmetry or swelling. Eyes: No conjunctival pallor. Ears: Not hard of hearing Nose: No congestion or discharge Mouth: Edentulous.  Tongue midline.  Mucosa is moist and clear. Neck: No JVD, masses, thyromegaly. Lungs: Diminished but clear.  No wheezing, rhonchi, crackles Heart: RRR.  No MRG.  S1, S2 present Abdomen: Obese, soft.  Minimal if any tenderness, " feels full" with pressure.  No HSM, masses, bruits, hernias..   Rectal: Formed but soft brown stool in the rectum.  Stool is light brown, pink-tinged and tests FOBT positive.  No palpable masses.  Small, nonthrombosed, nonbleeding external hemorrhoids. Musc/Skeltl: No joint erythema or gross deformity. Extremities: No CCE. Neurologic: Oriented x3.  Good historian.  Moves all 4 limbs without tremor, strength not tested.  A few times during the exam she drifted off into a few, sleep state but easily aroused.   Skin:  No telangiectasia, no significant bruising.  No suspicious lesions or sores Nodes: No cervical adenopathy. Psych: Calm, cooperative, pleasant.  Intake/Output from previous day: No intake/output data recorded. Intake/Output this shift: No intake/output data recorded.  LAB RESULTS: Recent Labs    08/28/21 1638 08/28/21 2023 08/29/21 0355  WBC 10.0  --  7.9  HGB 5.3* 5.5* 6.4*  HCT 20.5* 21.6* 22.6*  PLT 267  --  211   BMET Lab Results  Component Value Date   NA 134 (L) 08/29/2021   NA 134 (L)  08/28/2021   NA 140 09/28/2019   K 3.5 08/29/2021   K 4.0 08/28/2021   K 4.4 09/28/2019   CL 105 08/29/2021   CL 104 08/28/2021   CL 102 09/28/2019   CO2 25 08/29/2021   CO2 23 08/28/2021   CO2 23 09/28/2019   GLUCOSE 104 (H) 08/29/2021   GLUCOSE 105 (H) 08/28/2021   GLUCOSE 123 (H) 09/28/2019   BUN  7 (L) 08/29/2021   BUN 8 08/28/2021   BUN 12 09/28/2019   CREATININE 0.71 08/29/2021   CREATININE 0.69 08/28/2021   CREATININE 0.78 09/28/2019   CALCIUM 8.0 (L) 08/29/2021   CALCIUM 8.5 (L) 08/28/2021   CALCIUM 9.3 09/28/2019   LFT Recent Labs    08/29/21 0355  PROT 5.6*  ALBUMIN 2.8*  AST 22  ALT 14  ALKPHOS 100  BILITOT 0.8   PT/INR Lab Results  Component Value Date   INR 0.92 10/06/2017   INR 2.17 01/15/2016   INR 1.50 01/14/2016   Hepatitis Panel No results for input(s): "HEPBSAG", "HCVAB", "HEPAIGM", "HEPBIGM" in the last 72 hours. C-Diff No components found for: "CDIFF" Lipase  No results found for: "LIPASE"  Drugs of Abuse     Component Value Date/Time   LABOPIA NONE DETECTED 08/24/2012 1409   COCAINSCRNUR NONE DETECTED 08/24/2012 1409   LABBENZ NONE DETECTED 08/24/2012 1409   AMPHETMU NONE DETECTED 08/24/2012 1409   THCU NONE DETECTED 08/24/2012 1409   LABBARB NONE DETECTED 08/24/2012 1409     RADIOLOGY STUDIES: DG Chest Port 1 View  Result Date: 08/29/2021 CLINICAL DATA:  71 year old female with constipation and shortness of breath. EXAM: PORTABLE CHEST - 1 VIEW COMPARISON:  04/30/2021, 11/27/2017 FINDINGS: The patient is rotated to the right. Apical projection. The mediastinal contours are within normal limits. No cardiomegaly. The lungs are clear bilaterally without evidence of focal consolidation, pleural effusion, or pneumothorax. No acute osseous abnormality. IMPRESSION: No acute cardiopulmonary process. Electronically Signed   By: Ruthann Cancer M.D.   On: 08/29/2021 07:53   DG Abd 2 Views  Result Date: 08/28/2021 CLINICAL DATA:   Constipation. EXAM: ABDOMEN - 2 VIEW COMPARISON:  None Available. FINDINGS: The bowel gas pattern is normal. There is a large amount of stool throughout the entire colon. There is also a large amount of stool in the rectum. There is no evidence of free air. No radio-opaque calculi. Cholecystectomy clips are present. Left-sided sacral stimulator device is present. Left hip arthroplasty is present. IMPRESSION: 1. Nonobstructive bowel gas pattern. 2. Large amount of stool throughout the colon and within the rectum. Electronically Signed   By: Ronney Asters M.D.   On: 08/28/2021 17:43      IMPRESSION:     Microcytic anemia, acute on chronic, recurrent.      Constipation.  Refractory to softeners and OTC laxatives.  Multiple meds contributing: opioids, tums (calcium), requip, cymbalta. Latest colonoscopy 2018 w adenomatous colon polyp.  Repeat surveillance due 10/2021.      Minor bleeding PR.  ? Stercoral ulcer/stercoral colitis.  Last colonoscopy 2018: TA polyps, tics, hemorrhoids.     Long standing hx gastroparesis.  C/O frequent GERD sxs.  Not a lot of n/v.  Takes Reglan chronically.      PLAN:       Dose of Relistor x 1.  Consider future Linzess.  SMOG enema. Ok for clear liquids.  ?inpt colonoscopy as this is due in 2 months.  Expect pt may have difficulty tolerating large volume oral prep so need to go slowly.     Azucena Freed  08/29/2021, 8:44 AM Phone [phone]

## 2021-08-29 NOTE — ED Notes (Signed)
Admitting paged about pt's back stimulator and that her remote is at home and has no way to get it. Delsa Sale RN  called husband with no answer

## 2021-08-29 NOTE — ED Notes (Signed)
Patient requested pain medication.

## 2021-08-29 NOTE — Assessment & Plan Note (Addendum)
Unclear whether upper or lower GI given mixed symptoms. ABLA with Hemoglobin of 5.3 on admission. Now stable s/p 3u pRBCs and 1L NS bolus. VSS, normotensive to hypertensive. EGD fairly unremarkable. Colonoscopy showing hemorrhoids, rectal ulcers, diverticulosis, and 2 polyps (resected). - GI consulted, appreciate recs:  -- f/u biopsies  -- high fiber diet, aggressive bowel regimen at d/c  -- repeat colonoscopy TBD pending biopsy results  -- avoid NSAIDs as able  -- Outpatient referral for pelvic floor PT  -- Use a squatty potty on discharge - IV Protonix 40mg  qd - H/H q12h - transfusion threshold 7

## 2021-08-29 NOTE — ED Notes (Signed)
Pts SPO2 dropped to 84% on room air. Placed pt on 2L O2, pts SPO2 back up to 98% on the 2L. RN notified.

## 2021-08-29 NOTE — ED Notes (Signed)
Patient was given medication for restless leg. She is now laying in bed trying to fall asleep.

## 2021-08-29 NOTE — Assessment & Plan Note (Addendum)
Pt p/w some SOB with cough. Denied chest pain. Normal WBC count. Symptoms thought to be most likely related to her anemia. EKG, CXR unremarkable.  -Continue to monitor, rpt CXR if worsening or febrile -Acute hypoxemia, while sleeping, suspect possible OSA, monitor for signs of infection

## 2021-08-29 NOTE — ED Notes (Signed)
Patient is resting in bed.

## 2021-08-30 ENCOUNTER — Telehealth (HOSPITAL_COMMUNITY): Payer: Self-pay | Admitting: Pharmacy Technician

## 2021-08-30 ENCOUNTER — Other Ambulatory Visit (HOSPITAL_COMMUNITY): Payer: Self-pay

## 2021-08-30 DIAGNOSIS — D500 Iron deficiency anemia secondary to blood loss (chronic): Secondary | ICD-10-CM

## 2021-08-30 DIAGNOSIS — R0609 Other forms of dyspnea: Secondary | ICD-10-CM | POA: Diagnosis not present

## 2021-08-30 DIAGNOSIS — K922 Gastrointestinal hemorrhage, unspecified: Secondary | ICD-10-CM | POA: Diagnosis not present

## 2021-08-30 DIAGNOSIS — K5900 Constipation, unspecified: Secondary | ICD-10-CM | POA: Diagnosis not present

## 2021-08-30 DIAGNOSIS — I10 Essential (primary) hypertension: Secondary | ICD-10-CM

## 2021-08-30 DIAGNOSIS — D649 Anemia, unspecified: Secondary | ICD-10-CM | POA: Diagnosis not present

## 2021-08-30 LAB — BPAM RBC
Blood Product Expiration Date: 202308232359
Blood Product Expiration Date: 202309042359
Blood Product Expiration Date: 202309172359
ISSUE DATE / TIME: 202308152213
ISSUE DATE / TIME: 202308160026
ISSUE DATE / TIME: 202308160628
Unit Type and Rh: 5100
Unit Type and Rh: 5100
Unit Type and Rh: 5100

## 2021-08-30 LAB — HEMOGLOBIN AND HEMATOCRIT, BLOOD
HCT: 29.7 % — ABNORMAL LOW (ref 36.0–46.0)
Hemoglobin: 8.6 g/dL — ABNORMAL LOW (ref 12.0–15.0)

## 2021-08-30 LAB — BASIC METABOLIC PANEL
Anion gap: 6 (ref 5–15)
BUN: 7 mg/dL — ABNORMAL LOW (ref 8–23)
CO2: 27 mmol/L (ref 22–32)
Calcium: 8.3 mg/dL — ABNORMAL LOW (ref 8.9–10.3)
Chloride: 101 mmol/L (ref 98–111)
Creatinine, Ser: 0.69 mg/dL (ref 0.44–1.00)
GFR, Estimated: >60 mL/min (ref >60)
Glucose, Bld: 88 mg/dL (ref 70–99)
Potassium: 3.6 mmol/L (ref 3.5–5.1)
Sodium: 134 mmol/L — ABNORMAL LOW (ref 135–145)

## 2021-08-30 LAB — CBC
HCT: 26.2 % — ABNORMAL LOW (ref 36.0–46.0)
Hemoglobin: 7.8 g/dL — ABNORMAL LOW (ref 12.0–15.0)
MCH: 20.6 pg — ABNORMAL LOW (ref 26.0–34.0)
MCHC: 29.8 g/dL — ABNORMAL LOW (ref 30.0–36.0)
MCV: 69.3 fL — ABNORMAL LOW (ref 80.0–100.0)
Platelets: 180 10*3/uL (ref 150–400)
RBC: 3.78 MIL/uL — ABNORMAL LOW (ref 3.87–5.11)
RDW: 23 % — ABNORMAL HIGH (ref 11.5–15.5)
WBC: 6.2 10*3/uL (ref 4.0–10.5)
nRBC: 0.8 % — ABNORMAL HIGH (ref 0.0–0.2)

## 2021-08-30 LAB — TYPE AND SCREEN
ABO/RH(D): O POS
Antibody Screen: NEGATIVE
Unit division: 0
Unit division: 0
Unit division: 0

## 2021-08-30 MED ORDER — SODIUM CHLORIDE 0.9 % IV SOLN: INTRAVENOUS | Status: AC

## 2021-08-30 MED ORDER — PANTOPRAZOLE SODIUM 40 MG PO TBEC
40.0000 mg | DELAYED_RELEASE_TABLET | Freq: Two times a day (BID) | ORAL | Status: DC
  Administered 2021-08-30: 40 mg via ORAL
  Filled 2021-08-30: qty 1

## 2021-08-30 MED ORDER — METOCLOPRAMIDE HCL 5 MG PO TABS
5.0000 mg | ORAL_TABLET | Freq: Three times a day (TID) | ORAL | Status: DC
  Administered 2021-08-30 – 2021-09-01 (×5): 5 mg via ORAL
  Filled 2021-08-30 (×6): qty 1

## 2021-08-30 MED ORDER — LIDOCAINE-PRILOCAINE 2.5-2.5 % EX CREA: TOPICAL_CREAM | Freq: Three times a day (TID) | CUTANEOUS | Status: DC | PRN

## 2021-08-30 MED ORDER — ROPINIROLE HCL 1 MG PO TABS
1.0000 mg | ORAL_TABLET | Freq: Two times a day (BID) | ORAL | Status: DC
  Administered 2021-08-30 – 2021-09-01 (×5): 1 mg via ORAL
  Filled 2021-08-30 (×5): qty 1

## 2021-08-30 MED ORDER — BISACODYL 5 MG PO TBEC
20.0000 mg | DELAYED_RELEASE_TABLET | Freq: Once | ORAL | Status: AC
Start: 2021-08-30 — End: 2021-08-30
  Administered 2021-08-30: 20 mg via ORAL
  Filled 2021-08-30: qty 4

## 2021-08-30 MED ORDER — PEG-KCL-NACL-NASULF-NA ASC-C 100 G PO SOLR
1.0000 | Freq: Once | ORAL | Status: AC
  Administered 2021-08-30: 200 g via ORAL
  Filled 2021-08-30: qty 1

## 2021-08-30 MED ORDER — LIDOCAINE-PRILOCAINE 2.5-2.5 % EX CREA
TOPICAL_CREAM | Freq: Once | CUTANEOUS | Status: AC
Start: 2021-08-30 — End: 2021-08-30
  Filled 2021-08-30: qty 5

## 2021-08-30 MED ORDER — LINACLOTIDE 145 MCG PO CAPS
290.0000 ug | ORAL_CAPSULE | Freq: Once | ORAL | Status: AC
Start: 2021-08-30 — End: 2021-08-30
  Administered 2021-08-30: 290 ug via ORAL
  Filled 2021-08-30: qty 2

## 2021-08-30 NOTE — Progress Notes (Signed)
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 Adenoma on colonoscopy 2018. Sigmoid diverticulosis by prior colonoscopy and imaging Gastroparesis  Plan: - Continue serial hgb/hct with transfusion as indicated - Bowel purge today with plans for EGD and colonoscopy tomorrow if the clean-out is successful  Subjective: Continued constipation. She is afraid to have a bowel movement due to associated pain. No additional overt bleeding.   Objective:  Vital signs in last 24 hours: Temp:  [97.8 F (36.6 C)-98.7 F (37.1 C)] 97.8 F (36.6 C) (08/17 0800) Pulse Rate:  [86-96] 88 (08/17 0800) Resp:  [16-24] 24 (08/17 0800) BP: (105-177)/(60-82) 167/68 (08/17 0800) SpO2:  [90 %-100 %] 96 % (08/17 0800) Weight:  [93 kg] 93 kg (08/16 1530) Last BM Date : 08/29/21 General:   Alert, in NAD Abdomen:  Soft. Nontender. Nondistended. Normal bowel sounds. No rebound or guarding. Neurologic:  Alert and  oriented x4;  grossly normal neurologically. Psych:  Alert and cooperative. Normal mood and affect.   Lab Results: Recent Labs    08/28/21 1638 08/28/21 2023 08/29/21 0355 08/29/21 1216 08/30/21 0437  WBC 10.0  --  7.9  --  6.2  HGB 5.3*   < > 6.4* 7.9* 7.8*  HCT 20.5*   < > 22.6* 27.0* 26.2*  PLT 267  --  211  --  180   < > = values in this interval not  displayed.   BMET Recent Labs    08/28/21 1638 08/29/21 0355 08/30/21 0437  NA 134* 134* 134*  K 4.0 3.5 3.6  CL 104 105 101  CO2 23 25 27   GLUCOSE 105* 104* 88  BUN 8 7* 7*  CREATININE 0.69 0.71 0.69  CALCIUM 8.5* 8.0* 8.3*   LFT Recent Labs    08/29/21 0355  PROT 5.6*  ALBUMIN 2.8*  AST 22  ALT 14  ALKPHOS 100  BILITOT 0.8   PT/INR No results for input(s): "LABPROT", "INR" in the last 72 hours. Hepatitis Panel No results for input(s): "HEPBSAG", "HCVAB", "HEPAIGM", "HEPBIGM" in the last 72 hours.  CT HEAD WO CONTRAST (5MM)  Result Date: 08/29/2021 CLINICAL DATA:  Mental status change EXAM: CT HEAD WITHOUT CONTRAST TECHNIQUE: Contiguous axial images were obtained from the base of the skull through the vertex without intravenous contrast. RADIATION DOSE REDUCTION: This exam was performed according to the departmental dose-optimization program which includes automated exposure control, adjustment of the mA and/or kV according to patient size and/or use of iterative reconstruction technique. COMPARISON:  Head CT dated August 24, 2012 FINDINGS: Brain: Chronic white matter ischemic change. No evidence of acute infarction, hemorrhage, hydrocephalus, extra-axial collection or mass lesion/mass effect. Vascular: No hyperdense vessel or unexpected calcification. Skull: Normal. Negative for fracture or focal lesion. Sinuses/Orbits: No acute finding. Other: None.  IMPRESSION: No acute intracranial abnormality. Electronically Signed   By: Yetta Glassman M.D.   On: 08/29/2021 12:35   DG Chest Port 1 View  Result Date: 08/29/2021 CLINICAL DATA:  71 year old female with constipation and shortness of breath. EXAM: PORTABLE CHEST - 1 VIEW COMPARISON:  04/30/2021, 11/27/2017 FINDINGS: The patient is rotated to the right. Apical projection. The mediastinal contours are within normal limits. No cardiomegaly. The lungs are clear bilaterally without evidence of focal consolidation, pleural  effusion, or pneumothorax. No acute osseous abnormality. IMPRESSION: No acute cardiopulmonary process. Electronically Signed   By: Ruthann Cancer M.D.   On: 08/29/2021 07:53   DG Abd 2 Views  Result Date: 08/28/2021 CLINICAL DATA:  Constipation. EXAM: ABDOMEN - 2 VIEW COMPARISON:  None Available. FINDINGS: The bowel gas pattern is normal. There is a large amount of stool throughout the entire colon. There is also a large amount of stool in the rectum. There is no evidence of free air. No radio-opaque calculi. Cholecystectomy clips are present. Left-sided sacral stimulator device is present. Left hip arthroplasty is present. IMPRESSION: 1. Nonobstructive bowel gas pattern. 2. Large amount of stool throughout the colon and within the rectum. Electronically Signed   By: Ronney Asters M.D.   On: 08/28/2021 17:43       LOS: 1 day   [place]  08/30/2021, 9:04 AM

## 2021-08-30 NOTE — Evaluation (Signed)
Occupational Therapy Evaluation Patient Details Name: [name] MRN: [id number] DOB: [birth year] Today's Date: 08/30/2021   History of Present Illness Pt is a 71 y/o female presenting on 8/15 with constipation and abdominal pain, hx of melena for several weeks. Admitted for GI bleed, s/p 3u pRBCs. Plan for EGD. Also with alerted mental state, CT negative for acute chages. PMH includes: anxiety, COPD, DDD, HTN, iron deficiency anemia, L sided weakness, OA, paynaud's phenomenon, seizures.   Clinical Impression   PTA patient reports independent with mobility using cane, ADLs and limited IADLS.  She reports decreased ability to complete IADLS for the past 3 weeks, decreased strength and activity tolerance. Admitted for above and presents with problem list below, including decreased activity tolerance, generalized weakness, and mild unsteadiness.  She was exiting bathroom upon entry, supervision for mobility and transfers for safety with pt appearing very fatigued and endorsing weakness/unsteadiness.  She completes ADLS with up to min assist for LB ADLs but this is baseline.  Cognitively, appears WFL (oriented, cooperative, and aware of deficits). Believe she will benefit from further OT services acutely to optimize independence, tolerance and safety for return to PLOF.      Recommendations for follow up therapy are one component of a multi-disciplinary discharge planning process, led by the attending physician.  Recommendations may be updated based on patient status, additional functional criteria and insurance authorization.   Follow Up Recommendations  No OT follow up    Assistance Recommended at Discharge Intermittent Supervision/Assistance  Patient can return home with the following A little help with walking and/or transfers;A little help with bathing/dressing/bathroom;Assistance with cooking/housework;Assist for transportation;Help with stairs or ramp for entrance    Functional Status  Assessment  Patient has had a recent decline in their functional status and demonstrates the ability to make significant improvements in function in a reasonable and predictable amount of time.  Equipment Recommendations  None recommended by OT    Recommendations for Other Services       Precautions / Restrictions Precautions Precautions: Fall Restrictions Weight Bearing Restrictions: No      Mobility Bed Mobility               General bed mobility comments: OOB upon entry, EOB upon exit    Transfers Overall transfer level: Needs assistance Equipment used: None Transfers: Sit to/from Stand Sit to Stand: Supervision           General transfer comment: for safety      Balance Overall balance assessment: Mild deficits observed, not formally tested                                         ADL either performed or assessed with clinical judgement   ADL Overall ADL's : Needs assistance/impaired     Grooming: Min guard;Standing           Upper Body Dressing : Set up;Sitting   Lower Body Dressing: Minimal assistance;Sit to/from stand Lower Body Dressing Details (indicate cue type and reason): assist for socks- baseline; supervision sit to stand Toilet Transfer: Min guard;Ambulation   Toileting- Clothing Manipulation and Hygiene: Supervision/safety;Sit to/from stand       Functional mobility during ADLs: Min guard       Vision   Vision Assessment?: No apparent visual deficits     Perception     Praxis      Pertinent Vitals/Pain Pain  Assessment Pain Assessment: No/denies pain     Hand Dominance Right   Extremity/Trunk Assessment Upper Extremity Assessment Upper Extremity Assessment: Generalized weakness   Lower Extremity Assessment Lower Extremity Assessment: Defer to PT evaluation       Communication Communication Communication: No difficulties   Cognition Arousal/Alertness: Awake/alert Behavior During Therapy:  WFL for tasks assessed/performed Overall Cognitive Status: Within Functional Limits for tasks assessed                                       General Comments  pt exiting bathroom upon entry, pt appears fatigued and voices feeling very weak.    Exercises     Shoulder Instructions      Home Living Family/patient expects to be discharged to:: Private residence Living Arrangements: Spouse/significant other Available Help at Discharge: Family;Available 24 hours/day Type of Home: House Home Access: Stairs to enter [employer] of Steps: 2 Entrance Stairs-Rails: None Home Layout: One level     Bathroom Shower/Tub: Occupational psychologist: Standard     Home Equipment: Conservation officer, nature (2 wheels);Cane - single point;Shower seat;Grab bars - tub/shower;BSC/3in1          Prior Functioning/Environment Prior Level of Function : Independent/Modified Independent;Driving             Mobility Comments: reports using cane for mobility ADLs Comments: reports independent with ADLs (baseline assist with socks), limited iADLs and driving.  decreased tolerance and ability to complete more then self care over the past 3 weeks.        OT Problem List: Decreased strength;Decreased activity tolerance;Decreased knowledge of use of DME or AE;Decreased knowledge of precautions;Obesity;Other (comment) (decreased activity tolerance)      OT Treatment/Interventions: Self-care/ADL training;Therapeutic exercise;DME and/or AE instruction;Therapeutic activities;Patient/family education;Balance training    OT Goals(Current goals can be found in the care plan section) Acute Rehab OT Goals Patient Stated Goal: feel better OT Goal Formulation: With patient Time For Goal Achievement: 09/13/21 Potential to Achieve Goals: Good  OT Frequency: Min 2X/week    Co-evaluation              AM-PAC OT "6 Clicks" Daily Activity     Outcome Measure Help from another  person eating meals?: None Help from another person taking care of personal grooming?: A Little Help from another person toileting, which includes using toliet, bedpan, or urinal?: A Little Help from another person bathing (including washing, rinsing, drying)?: A Little Help from another person to put on and taking off regular upper body clothing?: A Little Help from another person to put on and taking off regular lower body clothing?: A Little 6 Click Score: 19   End of Session Nurse Communication: Mobility status  Activity Tolerance: Patient tolerated treatment well Patient left: in bed;with call bell/phone within reach;with family/visitor present  OT Visit Diagnosis: Other abnormalities of gait and mobility (R26.89);Muscle weakness (generalized) (M62.81)                Time: 0940-7680 OT Time Calculation (min): 15 min Charges:  OT General Charges $OT Visit: 1 Visit OT Evaluation $OT Eval Low Complexity: 1 Low  Jolaine Artist, OT Acute Rehabilitation Services Office [phone]   Delight Stare 08/30/2021, 1:41 PM

## 2021-08-30 NOTE — Anesthesia Preprocedure Evaluation (Addendum)
Anesthesia Evaluation  Patient identified by MRN, date of birth, ID band Patient awake    Reviewed: Allergy & Precautions, NPO status , Patient's Chart, lab work & pertinent test results  History of Anesthesia Complications Negative for: history of anesthetic complications  Airway Mallampati: II  TM Distance: >3 FB Neck ROM: Full    Dental  (+) Edentulous Upper, Edentulous Lower, Dental Advisory Given   Pulmonary sleep apnea , COPD,  COPD inhaler, Current Smoker and Patient abstained from smoking.,    Pulmonary exam normal        Cardiovascular hypertension, Normal cardiovascular exam     Neuro/Psych PSYCHIATRIC DISORDERS Anxiety Depression negative neurological ROS     GI/Hepatic Neg liver ROS, GERD  Medicated,  Endo/Other  Morbid obesity  Renal/GU negative Renal ROS     Musculoskeletal negative musculoskeletal ROS (+)   Abdominal   Peds  Hematology  (+) Blood dyscrasia, anemia ,   Anesthesia Other Findings   Reproductive/Obstetrics                            Anesthesia Physical Anesthesia Plan  ASA: 3  Anesthesia Plan: MAC   Post-op Pain Management: Minimal or no pain anticipated   Induction:   PONV Risk Score and Plan: 2 and Ondansetron  Airway Management Planned: Natural Airway  Additional Equipment:   Intra-op Plan:   Post-operative Plan:   Informed Consent: I have reviewed the patients History and Physical, chart, labs and discussed the procedure including the risks, benefits and alternatives for the proposed anesthesia with the patient or authorized representative who has indicated his/her understanding and acceptance.     Dental advisory given  Plan Discussed with: Anesthesiologist and CRNA  Anesthesia Plan Comments:        Anesthesia Quick Evaluation

## 2021-08-30 NOTE — Hospital Course (Addendum)
[name] is a 71 y.o.female with a history of chronic IDA, CVA (2017), COPD, tobacco use, colon polyps (2018), gastroparesis, and diverticular disease who was admitted to the [facility] at [facility] for acute on chronic anemia in the setting of recent melena, thought to be possible GI bleed. Her hospital course is detailed below:  GI Bleed ABLA with Hgb 5.3 on admission. Received 3u pRBC transfusion on admission and hgb improved. Hgb remained stable for the remainder of her stay and did not require additional transfusions.  GI consulted and performed bowel cleanout with Relistor, SMOG enema, and moviprep. Performed EGD and colonoscopy on 8/18.  Colonoscopy showed perianal hemorrhoids, rectal ulcers, diverticulosis in sigmoid and descending colon, and 2-84mm polyps in the rectum and sigmoid colon (resected). EGD was fairly unremarkable (impressions below).   She was sent home with Linzess as well as hydrocortisone suppositories.  GI recommendations as follows: - Follow a high fiber diet. Drink at least 64 ounces of water daily. - Continue present medications. - Aggressive bowel regiment to avoid constipation to include Relastor given chronic narcotics. (Linzess, lubiprostone also ok) - Consider Carafate suppositories for 6 weeks for local therapy of rectal ulcers. - Await pathology results. - Repeat colonoscopy date to be determined after pending pathology results are reviewed for surveillance. If these are tubular adenomas, may consider no surveillance as age would be over 17 at that time. - Emerging evidence supports eating a diet of fruits, vegetables, grains, calcium, and yogurt while reducing red meat and alcohol may reduce the risk of colon cancer. - Avoid NSAIDS as able  Dyspnea Pt initially presented with some SOB and chronic cough. Had COVID several months ago. Her WBC was normal and symptoms thought to be most related to her acute anemia vs sequelae from  covid. EKG, CXR unremarkable.  Altered mental state Pt was disoriented to time on arrival but correctable. CT head unremarkable. Consider discontinuation of any meds contributing to AMS.  Other chronic conditions: HTN - pt reported no home antihypertensive use  RLS - continued ropinirole  HLD - continued home rosuvastatin     COLONOSOCPY Impression: - Hemorrhoids found on perianal exam. - A few ulcers in the distal rectum. Biopsied. - Diverticulosis in the sigmoid colon and in the descending colon. - Two 2 to 4 mm polyps in the rectum and in the sigmoid colon, removed with a cold snare. Resected and retrieved.   EGD Impression: - Normal esophagus. - Erythematous mucosa in the gastric body. Biopsied. - Normal examined duodenum. Biopsied. - The examination was otherwise normal.   PCP Follow-up Recommendations: Consider tapering off opioids given her significant constipation.  Pt will need aggressive outpatient management of constipation Pt would benefit from outpatient pelvic floor PT Pt should use squatty potty at home Ensure GI f/u F/u GI biopsies Consider switch from PPI to famotidine Elevated BP during her stay, reports not taking home HP meds. Consider further w/u Pt likely needs regular outpatient iron infusions Consider discontinuing anticholinergics Consider further workup of anemia

## 2021-08-30 NOTE — TOC Benefit Eligibility Note (Signed)
Patient Teacher, English as a foreign language completed.    The patient is currently admitted and upon discharge could be taking Linzess 290 mcg capsules.  The current 30 day co-pay is $47.00.   The patient is insured through [place], [place] Patient Advocate Specialist [place] Patient Advocate Team Direct Number: [phone]  Fax: [phone]

## 2021-08-30 NOTE — Telephone Encounter (Signed)
Pharmacy Patient Advocate Encounter  Insurance verification completed.    The patient is insured through [employer] Part D   The patient is currently admitted and ran test claims for the following: Linzess.  Copays and coinsurance results were relayed to Inpatient clinical team.

## 2021-08-30 NOTE — Progress Notes (Signed)
Daily Progress Note Intern Pager: [phone]  Patient name: [name] Medical record number: [id number] Date of birth: May 14, 1950 Age: 71 y.o. Gender: female  Primary Care Provider: Alvester Chou, NP Consultants: GI Code Status: FULL  Pt Overview and Major Events to Date:  8/15 - admitted, 3u pRBC overnight  Assessment and Plan:  [name] is a 71 y.o. female p/w constipation and abdominal pain with recent hx of melena for several weeks. Had BRBPR on arrival. Unclear etiology of bleed given hx. Hgb remains stable, currently working on clearing out her bowels in anticipation of possible endoscopy.   * GI bleed Unclear whether upper or lower GI given mixed symptoms. ABLA with Hemoglobin of 5.3 on admission. Now stable s/p 3u pRBCs and 1L NS bolus. VSS, normotensive to hypertensive.  - GI consulted, appreciate recs: --- Relistor trial and SMOG enema yesterday --- Aggressive outpatient management on constipation on discharge --- Consider inpatient EGD, colonoscopy after bowel purge - possibly 8/18 or 8/19 --- Outpatient referral for pelvic floor PT --- Use a squatty potty on discharge - IV Protonix 40mg  BID - H/H q12h -- NPO @ MN before procedure - transfusion threshold 7  Constipation Possibly related to opioid use vs IBS -Bowel regimen per GI  Altered mental state Unclear if baseline or new confusion, suspect due to acute illness and possible component of vascular dementia. CT head wnl. - Would review medication list, consider discontinuation of trazodone  - Will discuss with husband   Dyspnea Pt p/w some SOB with cough. Denied chest pain. Normal WBC count. Symptoms thought to be most likely related to her anemia. EKG, CXR unremarkable.  -Continue to monitor, rpt CXR if worsening or febrile -Acute hypoxemia, while sleeping, suspect possible OSA, monitor for signs of infection      FEN/GI: Clear liquids PPx: none Dispo:Home pending clinical improvement  . Barriers include bowel cleanout, ?endoscopy.   Subjective:  Pt feeling under the weather this morning, states she has been coughing so much since she had covid in January that now her chest hurts whenever she coughs. Denies chest pain at rest when she's not coughing, denies radiation. States she is scared to use the restroom because it hurts when she tries to go.   Objective: Temp:  [97.8 F (36.6 C)-98.7 F (37.1 C)] 97.8 F (36.6 C) (08/17 0800) Pulse Rate:  [86-96] 88 (08/17 0800) Resp:  [17-24] 24 (08/17 0800) BP: (105-177)/(60-82) 167/68 (08/17 0800) SpO2:  [90 %-97 %] 96 % (08/17 0800) Weight:  [93 kg] 93 kg (08/16 1530) Physical Exam: General: Sitting in bed, cheerful, conversational Cardiovascular: RRR Respiratory: CTAB Abdomen: soft, moderate diffuse tenderness to palpation, non distened, no guarding  Laboratory: Most recent CBC Lab Results  Component Value Date   WBC 6.2 08/30/2021   HGB 7.8 (L) 08/30/2021   HCT 26.2 (L) 08/30/2021   MCV 69.3 (L) 08/30/2021   PLT 180 08/30/2021   Most recent BMP    Latest Ref Rng & Units 08/30/2021    4:37 AM  BMP  Glucose 70 - 99 mg/dL 88   BUN 8 - 23 mg/dL 7   Creatinine 0.44 - 1.00 mg/dL 0.69   Sodium 135 - 145 mmol/L 134   Potassium 3.5 - 5.1 mmol/L 3.6   Chloride 98 - 111 mmol/L 101   CO2 22 - 32 mmol/L 27   Calcium 8.9 - 10.3 mg/dL 8.3    Imaging/Diagnostic Tests:  CT HEAD WO CONTRAST (8/16): Radiologist Impression:  No acute intracranial abnormality.  [name], MD 08/30/2021, 11:25 AM  PGY-1, [place] Intern pager: [phone], text pages welcome Secure chat group [place]

## 2021-08-30 NOTE — Assessment & Plan Note (Addendum)
Possibly related to opioid use vs IBS -Bowel regimen as above -hydrocortisone suppository prn for pain

## 2021-08-30 NOTE — H&P (View-Only) (Signed)
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 Adenoma on colonoscopy 2018. Sigmoid diverticulosis by prior colonoscopy and imaging Gastroparesis  Plan: - Continue serial hgb/hct with transfusion as indicated - Bowel purge today with plans for EGD and colonoscopy tomorrow if the clean-out is successful  Subjective: Continued constipation. She is afraid to have a bowel movement due to associated pain. No additional overt bleeding.   Objective:  Vital signs in last 24 hours: Temp:  [97.8 F (36.6 C)-98.7 F (37.1 C)] 97.8 F (36.6 C) (08/17 0800) Pulse Rate:  [86-96] 88 (08/17 0800) Resp:  [16-24] 24 (08/17 0800) BP: (105-177)/(60-82) 167/68 (08/17 0800) SpO2:  [90 %-100 %] 96 % (08/17 0800) Weight:  [93 kg] 93 kg (08/16 1530) Last BM Date : 08/29/21 General:   Alert, in NAD Abdomen:  Soft. Nontender. Nondistended. Normal bowel sounds. No rebound or guarding. Neurologic:  Alert and  oriented x4;  grossly normal neurologically. Psych:  Alert and cooperative. Normal mood and affect.   Lab Results: Recent Labs    08/28/21 1638 08/28/21 2023 08/29/21 0355 08/29/21 1216 08/30/21 0437  WBC 10.0  --  7.9  --  6.2  HGB 5.3*   < > 6.4* 7.9* 7.8*  HCT 20.5*   < > 22.6* 27.0* 26.2*  PLT 267  --  211  --  180   < > = values in this interval not  displayed.   BMET Recent Labs    08/28/21 1638 08/29/21 0355 08/30/21 0437  NA 134* 134* 134*  K 4.0 3.5 3.6  CL 104 105 101  CO2 23 25 27   GLUCOSE 105* 104* 88  BUN 8 7* 7*  CREATININE 0.69 0.71 0.69  CALCIUM 8.5* 8.0* 8.3*   LFT Recent Labs    08/29/21 0355  PROT 5.6*  ALBUMIN 2.8*  AST 22  ALT 14  ALKPHOS 100  BILITOT 0.8   PT/INR No results for input(s): "LABPROT", "INR" in the last 72 hours. Hepatitis Panel No results for input(s): "HEPBSAG", "HCVAB", "HEPAIGM", "HEPBIGM" in the last 72 hours.  CT HEAD WO CONTRAST (5MM)  Result Date: 08/29/2021 CLINICAL DATA:  Mental status change EXAM: CT HEAD WITHOUT CONTRAST TECHNIQUE: Contiguous axial images were obtained from the base of the skull through the vertex without intravenous contrast. RADIATION DOSE REDUCTION: This exam was performed according to the departmental dose-optimization program which includes automated exposure control, adjustment of the mA and/or kV according to patient size and/or use of iterative reconstruction technique. COMPARISON:  Head CT dated August 24, 2012 FINDINGS: Brain: Chronic white matter ischemic change. No evidence of acute infarction, hemorrhage, hydrocephalus, extra-axial collection or mass lesion/mass effect. Vascular: No hyperdense vessel or unexpected calcification. Skull: Normal. Negative for fracture or focal lesion. Sinuses/Orbits: No acute finding. Other: None.  IMPRESSION: No acute intracranial abnormality. Electronically Signed   By: Yetta Glassman M.D.   On: 08/29/2021 12:35   DG Chest Port 1 View  Result Date: 08/29/2021 CLINICAL DATA:  71 year old female with constipation and shortness of breath. EXAM: PORTABLE CHEST - 1 VIEW COMPARISON:  04/30/2021, 11/27/2017 FINDINGS: The patient is rotated to the right. Apical projection. The mediastinal contours are within normal limits. No cardiomegaly. The lungs are clear bilaterally without evidence of focal consolidation, pleural  effusion, or pneumothorax. No acute osseous abnormality. IMPRESSION: No acute cardiopulmonary process. Electronically Signed   By: Ruthann Cancer M.D.   On: 08/29/2021 07:53   DG Abd 2 Views  Result Date: 08/28/2021 CLINICAL DATA:  Constipation. EXAM: ABDOMEN - 2 VIEW COMPARISON:  None Available. FINDINGS: The bowel gas pattern is normal. There is a large amount of stool throughout the entire colon. There is also a large amount of stool in the rectum. There is no evidence of free air. No radio-opaque calculi. Cholecystectomy clips are present. Left-sided sacral stimulator device is present. Left hip arthroplasty is present. IMPRESSION: 1. Nonobstructive bowel gas pattern. 2. Large amount of stool throughout the colon and within the rectum. Electronically Signed   By: Ronney Asters M.D.   On: 08/28/2021 17:43       LOS: 1 day   Thornton Park  08/30/2021, 9:04 AM

## 2021-08-31 ENCOUNTER — Telehealth: Payer: Self-pay

## 2021-08-31 ENCOUNTER — Telehealth (HOSPITAL_COMMUNITY): Payer: Self-pay | Admitting: Pharmacy Technician

## 2021-08-31 ENCOUNTER — Inpatient Hospital Stay (HOSPITAL_COMMUNITY): Payer: Medicare Other | Admitting: Anesthesiology

## 2021-08-31 ENCOUNTER — Other Ambulatory Visit (HOSPITAL_COMMUNITY): Payer: Self-pay

## 2021-08-31 ENCOUNTER — Encounter (HOSPITAL_COMMUNITY): Payer: Self-pay | Admitting: Student

## 2021-08-31 ENCOUNTER — Encounter (HOSPITAL_COMMUNITY)
Admission: EM | Disposition: A | Discharge status: Home Health | Payer: Self-pay | Source: Home / Self Care | Attending: Family Medicine

## 2021-08-31 DIAGNOSIS — J449 Chronic obstructive pulmonary disease, unspecified: Secondary | ICD-10-CM

## 2021-08-31 DIAGNOSIS — K5902 Outlet dysfunction constipation: Secondary | ICD-10-CM

## 2021-08-31 DIAGNOSIS — K3189 Other diseases of stomach and duodenum: Secondary | ICD-10-CM

## 2021-08-31 DIAGNOSIS — K626 Ulcer of anus and rectum: Secondary | ICD-10-CM

## 2021-08-31 DIAGNOSIS — K621 Rectal polyp: Secondary | ICD-10-CM

## 2021-08-31 DIAGNOSIS — D509 Iron deficiency anemia, unspecified: Secondary | ICD-10-CM

## 2021-08-31 DIAGNOSIS — K2970 Gastritis, unspecified, without bleeding: Secondary | ICD-10-CM

## 2021-08-31 DIAGNOSIS — D125 Benign neoplasm of sigmoid colon: Secondary | ICD-10-CM

## 2021-08-31 DIAGNOSIS — I10 Essential (primary) hypertension: Secondary | ICD-10-CM

## 2021-08-31 DIAGNOSIS — D500 Iron deficiency anemia secondary to blood loss (chronic): Secondary | ICD-10-CM | POA: Diagnosis not present

## 2021-08-31 DIAGNOSIS — R195 Other fecal abnormalities: Secondary | ICD-10-CM

## 2021-08-31 DIAGNOSIS — K5730 Diverticulosis of large intestine without perforation or abscess without bleeding: Secondary | ICD-10-CM

## 2021-08-31 DIAGNOSIS — K6289 Other specified diseases of anus and rectum: Secondary | ICD-10-CM

## 2021-08-31 DIAGNOSIS — D649 Anemia, unspecified: Secondary | ICD-10-CM | POA: Diagnosis not present

## 2021-08-31 DIAGNOSIS — F17210 Nicotine dependence, cigarettes, uncomplicated: Secondary | ICD-10-CM

## 2021-08-31 HISTORY — PX: COLONOSCOPY WITH PROPOFOL: SHX5780

## 2021-08-31 HISTORY — PX: BIOPSY: SHX5522

## 2021-08-31 HISTORY — PX: ESOPHAGOGASTRODUODENOSCOPY (EGD) WITH PROPOFOL: SHX5813

## 2021-08-31 HISTORY — PX: POLYPECTOMY: SHX5525

## 2021-08-31 LAB — HEMOGLOBIN AND HEMATOCRIT, BLOOD
HCT: 30.1 % — ABNORMAL LOW (ref 36.0–46.0)
HCT: 30.5 % — ABNORMAL LOW (ref 36.0–46.0)
Hemoglobin: 9 g/dL — ABNORMAL LOW (ref 12.0–15.0)
Hemoglobin: 8.6 g/dL — ABNORMAL LOW (ref 12.0–15.0)

## 2021-08-31 SURGERY — ESOPHAGOGASTRODUODENOSCOPY (EGD) WITH PROPOFOL
Anesthesia: Monitor Anesthesia Care

## 2021-08-31 MED ORDER — LACTATED RINGERS IV SOLN: INTRAVENOUS | Status: DC

## 2021-08-31 MED ORDER — HYDROCORTISONE ACETATE 25 MG RE SUPP
25.0000 mg | Freq: Two times a day (BID) | RECTAL | Status: DC | PRN
Start: 2021-08-31 — End: 2021-09-01

## 2021-08-31 MED ORDER — PROPOFOL 10 MG/ML IV BOLUS
INTRAVENOUS | Status: DC | PRN
  Administered 2021-08-31: 50 mg via INTRAVENOUS

## 2021-08-31 MED ORDER — SODIUM CHLORIDE 0.9 % IV SOLN: INTRAVENOUS | Status: DC

## 2021-08-31 MED ORDER — LIDOCAINE 2% (20 MG/ML) 5 ML SYRINGE
INTRAMUSCULAR | Status: DC | PRN
  Administered 2021-08-31: 80 mg via INTRAVENOUS

## 2021-08-31 MED ORDER — PROPOFOL 500 MG/50ML IV EMUL
INTRAVENOUS | Status: DC | PRN
  Administered 2021-08-31: 125 ug/kg/min via INTRAVENOUS

## 2021-08-31 MED ORDER — PANTOPRAZOLE SODIUM 40 MG PO TBEC
40.0000 mg | DELAYED_RELEASE_TABLET | Freq: Every day | ORAL | Status: DC
  Administered 2021-08-31 – 2021-09-01 (×2): 40 mg via ORAL
  Filled 2021-08-31 (×2): qty 1

## 2021-08-31 SURGICAL SUPPLY — 25 items

## 2021-08-31 NOTE — Progress Notes (Signed)
Occupational Therapy Treatment Patient Details Name: [name] MRN: [id number] DOB: 28-Nov-1950 Today's Date: 08/31/2021   History of present illness Pt is a 71 y/o female presenting on 8/15 with constipation and abdominal pain, hx of melena for several weeks. Admitted for GI bleed, s/p 3u pRBCs.s/p upper endoscopy, colonoscopy showing diverticulosis and 2 polyps removed in rectum and sigmoid colon on 8/18. Also with alerted mental state, CT negative for acute chages. PMH includes: anxiety, COPD, DDD, HTN, iron deficiency anemia, L sided weakness, OA, paynaud's phenomenon, seizures.   OT comments  Focus of session on educating pt in energy conservation strategies, toileting and standing grooming. Pt receptive to education. Supervised to and from bathroom and sink area. Pt set up to eat lunch at end of session seated EOB with family members bedside.    Recommendations for follow up therapy are one component of a multi-disciplinary discharge planning process, led by the attending physician.  Recommendations may be updated based on patient status, additional functional criteria and insurance authorization.    Follow Up Recommendations  No OT follow up    Assistance Recommended at Discharge Intermittent Supervision/Assistance  Patient can return home with the following  A little help with walking and/or transfers;A little help with bathing/dressing/bathroom;Assistance with cooking/housework;Assist for transportation;Help with stairs or ramp for entrance   Equipment Recommendations  None recommended by OT    Recommendations for Other Services      Precautions / Restrictions Precautions Precautions: Fall Restrictions Weight Bearing Restrictions: No       Mobility Bed Mobility Overal bed mobility: Modified Independent                  Transfers Overall transfer level: Needs assistance Equipment used: None Transfers: Sit to/from Stand Sit to Stand: Supervision                  Balance                                           ADL either performed or assessed with clinical judgement   ADL Overall ADL's : Needs assistance/impaired Eating/Feeding: Independent;Sitting   Grooming: Wash/dry hands;Standing;Supervision/safety                   Toilet Transfer: Supervision/safety;Ambulation;Regular Toilet   Toileting- Water quality scientist and Hygiene: Supervision/safety;Sit to/from stand       Functional mobility during ADLs: Supervision/safety      Extremity/Trunk Assessment Upper Extremity Assessment Upper Extremity Assessment: Defer to OT evaluation   Lower Extremity Assessment Lower Extremity Assessment: Generalized weakness   Cervical / Trunk Assessment Cervical / Trunk Assessment: Normal    Vision       Perception     Praxis      Cognition Arousal/Alertness: Awake/alert Behavior During Therapy: WFL for tasks assessed/performed Overall Cognitive Status: Within Functional Limits for tasks assessed                                          Exercises      Shoulder Instructions       General Comments      Pertinent Vitals/ Pain       Pain Assessment Pain Assessment: No/denies pain  Home Living Family/patient expects to be discharged to:: Private residence Living Arrangements: Spouse/significant  other Available Help at Discharge: Family;Available 24 hours/day Type of Home: House Home Access: Stairs to enter [employer] of Steps: 2 Entrance Stairs-Rails: None Home Layout: One level     Bathroom Shower/Tub: Occupational psychologist: Standard     Home Equipment: Conservation officer, nature (2 wheels);Cane - single point;Shower seat;Grab bars - tub/shower;BSC/3in1          Prior Functioning/Environment              Frequency  Min 2X/week        Progress Toward Goals  OT Goals(current goals can now be found in the care plan section)  Progress  towards OT goals: Progressing toward goals  Acute Rehab OT Goals OT Goal Formulation: With patient Time For Goal Achievement: 09/13/21 Potential to Achieve Goals: Good  Plan Discharge plan remains appropriate    Co-evaluation                 AM-PAC OT "6 Clicks" Daily Activity     Outcome Measure   Help from another person eating meals?: None Help from another person taking care of personal grooming?: None Help from another person toileting, which includes using toliet, bedpan, or urinal?: None Help from another person bathing (including washing, rinsing, drying)?: None Help from another person to put on and taking off regular upper body clothing?: None Help from another person to put on and taking off regular lower body clothing?: A Little 6 Click Score: 23    End of Session Equipment Utilized During Treatment: Gait belt  OT Visit Diagnosis: Other abnormalities of gait and mobility (R26.89);Muscle weakness (generalized) (M62.81)   Activity Tolerance Patient tolerated treatment well   Patient Left in bed;with call bell/phone within reach;with family/visitor present   Nurse Communication          Time: 1400-1416 OT Time Calculation (min): 16 min  Charges: OT General Charges $OT Visit: 1 Visit OT Treatments $Self Care/Home Management : 8-22 mins  Cleta Alberts, OTR/L Acute Rehabilitation Services Office: [phone]  [name] 08/31/2021, 3:45 PM

## 2021-08-31 NOTE — Transfer of Care (Signed)
Immediate Anesthesia Transfer of Care Note  Patient: [name]  Procedure(s) Performed: ESOPHAGOGASTRODUODENOSCOPY (EGD) WITH PROPOFOL COLONOSCOPY WITH PROPOFOL POLYPECTOMY  Patient Location: PACU Anesthesia Type: MAC  Level of Consciousness: awake, oriented and drowsy  Airway & Oxygen Therapy: Patient Spontanous Breathing and Patient connected to nasal cannula oxygen  Post-op Assessment: Report given to RN and Post -op Vital signs reviewed and stable  Post vital signs: Reviewed and stable     Last Vitals:  Vitals Value Taken Time  BP 132/53 08/31/21 1020  Temp    Pulse 88 08/31/21 1021  Resp 14 08/31/21 1021  SpO2 95 % 08/31/21 1021  Vitals shown include unvalidated device data.  Last Pain:  Vitals:   08/31/21 0843  TempSrc: Temporal  PainSc: 2          Complications: No notable events documented.

## 2021-08-31 NOTE — Progress Notes (Signed)
PT Cancellation Note  Patient Details Name: [name] MRN: [id number] DOB: March 12, 1950   Cancelled Treatment:    Reason Eval/Treat Not Completed: Patient at procedure or test/unavailable  Stacie Glaze, PT DPT Acute Rehabilitation Services Pager [phone]  Office [phone]    [place] 08/31/2021, 9:04 AM

## 2021-08-31 NOTE — Evaluation (Signed)
Physical Therapy Evaluation Patient Details Name: [name] MRN: [id number] DOB: 1950-07-06 Today's Date: 08/31/2021  History of Present Illness  Pt is a 71 y/o female presenting on 8/15 with constipation and abdominal pain, hx of melena for several weeks. Admitted for GI bleed, s/p 3u pRBCs.s/p upper endoscopy, colonoscopy showing diverticulosis and 2 polyps removed in rectum and sigmoid colon on 8/18. Also with alerted mental state, CT negative for acute chages. PMH includes: anxiety, COPD, DDD, HTN, iron deficiency anemia, L sided weakness, OA, paynaud's phenomenon, seizures.  Clinical Impression   Pt presents with generalized weakness, impaired balance vs baseline, and decreased activity tolerance. Pt to benefit from acute PT to address deficits. Pt ambulated short hallway distance, reports being drowsy and dizzy post-colonoscopy and per husband she just appears altered after procedure. PT anticipates mobility improvement acutely (per OT who saw pt after PT, pt much more steady). PT recommending HHPT given x3 week decline in strength and activity tolerance to return to PLOF. PT to progress mobility as tolerated, and will continue to follow acutely.         Recommendations for follow up therapy are one component of a multi-disciplinary discharge planning process, led by the attending physician.  Recommendations may be updated based on patient status, additional functional criteria and insurance authorization.  Follow Up Recommendations Home health PT      Assistance Recommended at Discharge Intermittent Supervision/Assistance  Patient can return home with the following  A little help with walking and/or transfers;A little help with bathing/dressing/bathroom;Help with stairs or ramp for entrance;Direct supervision/assist for medications management;Assist for transportation    Equipment Recommendations None recommended by PT  Recommendations for Other Services       Functional  Status Assessment Patient has had a recent decline in their functional status and demonstrates the ability to make significant improvements in function in a reasonable and predictable amount of time.     Precautions / Restrictions Precautions Precautions: Fall Restrictions Weight Bearing Restrictions: No      Mobility  Bed Mobility Overal bed mobility: Needs Assistance Bed Mobility: Supine to Sit     Supine to sit: Min assist     General bed mobility comments: assist for trunk elevation via HHA    Transfers Overall transfer level: Needs assistance Equipment used: None Transfers: Sit to/from Stand Sit to Stand: Supervision           General transfer comment: for safety    Ambulation/Gait Ambulation/Gait assistance: Min guard Gait Distance (Feet): 50 Feet Assistive device: Rolling walker (2 wheels), 1 person hand held assist Gait Pattern/deviations: Step-through pattern, Decreased stride length, Trunk flexed, Drifts right/left Gait velocity: decr     General Gait Details: cues for upright posture, pt reaching for environment and reporting min unsteadiness and dizziness without RW support, RW utilized for second half of gait  Science writer    Modified Rankin (Stroke Patients Only)       Balance Overall balance assessment: Mild deficits observed, not formally tested                                           Pertinent Vitals/Pain Pain Assessment Pain Assessment: Faces Faces Pain Scale: Hurts a little bit Pain Descriptors / Indicators: Headache Pain Intervention(s): Limited activity within patient's tolerance, Monitored during session, Repositioned  Home Living Family/patient expects to be discharged to:: Private residence Living Arrangements: Spouse/significant other Available Help at Discharge: Family;Available 24 hours/day Type of Home: House Home Access: Stairs to enter Entrance Stairs-Rails:  None Entrance Stairs-Number of Steps: 2   Home Layout: One level Home Equipment: Conservation officer, nature (2 wheels);Cane - single point;Shower seat;Grab bars - tub/shower;BSC/3in1      Prior Function Prior Level of Function : Independent/Modified Independent;Driving             Mobility Comments: reports using cane for mobility ADLs Comments: reports independent with ADLs (baseline assist with socks), limited iADLs and driving.  decreased tolerance and ability to complete more then self care over the past 3 weeks.     Hand Dominance   Dominant Hand: Right    Extremity/Trunk Assessment   Upper Extremity Assessment Upper Extremity Assessment: Defer to OT evaluation    Lower Extremity Assessment Lower Extremity Assessment: Generalized weakness    Cervical / Trunk Assessment Cervical / Trunk Assessment: Normal  Communication   Communication: No difficulties  Cognition Arousal/Alertness: Awake/alert (drowsy after colonoscopy and endoscopy this am) Behavior During Therapy: WFL for tasks assessed/performed, Flat affect Overall Cognitive Status: Within Functional Limits for tasks assessed                                          General Comments      Exercises     Assessment/Plan    PT Assessment Patient needs continued PT services  PT Problem List Decreased strength;Decreased mobility;Decreased activity tolerance;Decreased balance       PT Treatment Interventions Therapeutic activities;DME instruction;Gait training;Therapeutic exercise;Patient/family education;Balance training;Stair training;Functional mobility training;Neuromuscular re-education    PT Goals (Current goals can be found in the Care Plan section)  Acute Rehab PT Goals Patient Stated Goal: home PT Goal Formulation: With patient Time For Goal Achievement: 09/14/21 Potential to Achieve Goals: Good    Frequency Min 3X/week     Co-evaluation               AM-PAC PT "6 Clicks"  Mobility  Outcome Measure Help needed turning from your back to your side while in a flat bed without using bedrails?: A Little Help needed moving from lying on your back to sitting on the side of a flat bed without using bedrails?: A Little Help needed moving to and from a bed to a chair (including a wheelchair)?: A Little Help needed standing up from a chair using your arms (e.g., wheelchair or bedside chair)?: A Little Help needed to walk in hospital room?: A Little Help needed climbing 3-5 steps with a railing? : A Little 6 Click Score: 18    End of Session   Activity Tolerance: Patient limited by fatigue Patient left: in bed;with call bell/phone within reach;with family/visitor present;Other (comment) (MD at bedside) Nurse Communication: Mobility status PT Visit Diagnosis: Unsteadiness on feet (R26.81);Muscle weakness (generalized) (M62.81)    Time: 0037-0488 PT Time Calculation (min) (ACUTE ONLY): 11 min   Charges:   PT Evaluation $PT Eval Low Complexity: 1 Low         Stacie Glaze, PT DPT Acute Rehabilitation Services Pager [phone]  Office [phone]   Louis Matte 08/31/2021, 2:37 PM

## 2021-08-31 NOTE — Interval H&P Note (Signed)
History and Physical Interval Note:  08/31/2021 7:53 AM  [name]  has presented today for surgery, with the diagnosis of GI bleed, iron deficiency anemia, change in bowel habits.  The various methods of treatment have been discussed with the patient and family. After consideration of risks, benefits and other options for treatment, the patient has consented to  Procedure(s): ESOPHAGOGASTRODUODENOSCOPY (EGD) WITH PROPOFOL (N/A) COLONOSCOPY WITH PROPOFOL (N/A) as a surgical intervention.  The patient's history has been reviewed, patient examined, no change in status, stable for surgery.  I have reviewed the patient's chart and labs.  Questions were answered to the patient's satisfaction.     Thornton Park

## 2021-08-31 NOTE — Telephone Encounter (Signed)
-----   Message from Thornton Park, MD sent at 08/31/2021 10:57 AM EDT ----- Regarding: office follow-up Please arrange office follow-up with an APP in 3-4 weeks. She is a patient of Dr. Blanch Media.  KLB

## 2021-08-31 NOTE — Telephone Encounter (Signed)
Patient Advocate Encounter  Prior Authorization for Relistor 12MG /0.6ML injection has been approved.    PA# [id number] Key: [id number] Effective dates: 08/31/2021 through 03/03/2022  Patients co-pay is $1,556.[place], CPhT Pharmacy Patient Advocate Specialist [place] Patient Advocate Team Direct Number: [phone]  Fax: [phone]

## 2021-08-31 NOTE — Telephone Encounter (Signed)
Pharmacy Patient Advocate Encounter  Insurance verification completed.    The patient is insured through [employer] Part D   The patient is currently admitted and ran test claims for the following: Relistor (methylnaltrexone), lubiprostnone (Amitiza) .  Copays and coinsurance results were relayed to Inpatient clinical team.

## 2021-08-31 NOTE — Op Note (Signed)
[facility] Patient Name: [name] Procedure Date : 08/31/2021 MRN: [id number] Attending MD: Thornton Park MD, MD Date of Birth: 1950/02/21 CSN: [id number] Age: 71 Admit Type: Inpatient Procedure:                Upper GI endoscopy Indications:              Heme positive stool, history of iron deficiency Providers:                Thornton Park MD, MD, Benay Pillow, RN, Benetta Spar, Technician Referring MD:              Medicines:                Monitored Anesthesia Care Complications:            No immediate complications. Estimated Blood Loss:     Estimated blood loss was minimal. Procedure:                Pre-Anesthesia Assessment:                           - Prior to the procedure, a History and Physical                            was performed, and patient medications and                            allergies were reviewed. The patient's tolerance of                            previous anesthesia was also reviewed. The risks                            and benefits of the procedure and the sedation                            options and risks were discussed with the patient.                            All questions were answered, and informed consent                            was obtained. Prior Anticoagulants: The patient has                            taken no previous anticoagulant or antiplatelet                            agents. ASA Grade Assessment: III - A patient with                            severe systemic disease. After reviewing the risks  and benefits, the patient was deemed in                            satisfactory condition to undergo the procedure.                           After obtaining informed consent, the endoscope was                            passed under direct vision. Throughout the                            procedure, the patient's blood pressure, pulse, and                             oxygen saturations were monitored continuously. The                            [id number]-[id number] ([id number]) Olympus endoscope was introduced                            through the mouth, and advanced to the third part                            of duodenum. The upper GI endoscopy was                            accomplished without difficulty. The patient                            tolerated the procedure well. Findings:      The esophagus was normal.      Focal erythema without bleeding was found in the gastric body. Biopsies       were taken from this area, as well as the fundus and antrum with a cold       forceps for histology. Estimated blood loss was minimal.      The examined duodenum was normal. Biopsies were taken with a cold       forceps for histology. Estimated blood loss was minimal.      The cardia and gastric fundus were normal on retroflexion.      The exam was otherwise without abnormality. Impression:               - Normal esophagus.                           - Erythematous mucosa in the gastric body. Biopsied.                           - Normal examined duodenum. Biopsied.                           - The examination was otherwise normal. Recommendation:           - Return patient to hospital ward for ongoing care.                           -  Resume previous diet.                           - Continue present medications.                           - Avoid NSAIDs as able.                           - Await pathology results.                           - Proceed with colonoscopy as planned. Procedure Code(s):        --- Professional ---                           [phone], Esophagogastroduodenoscopy, flexible,                            transoral; with biopsy, single or multiple Diagnosis Code(s):        --- Professional ---                           K31.89, Other diseases of stomach and duodenum                           R19.5, Other fecal abnormalities CPT copyright  2019 American Medical Association. All rights reserved. The codes documented in this report are preliminary and upon coder review may  be revised to meet current compliance requirements. Thornton Park MD, MD 08/31/2021 9:44:21 AM This report has been signed electronically. Number of Addenda: 0

## 2021-08-31 NOTE — Op Note (Signed)
[facility] Patient Name: [name] Procedure Date : 08/31/2021 MRN: [id number] Attending MD: Thornton Park MD, MD Date of Birth: 02/15/1950 CSN: [id number] Age: 71 Admit Type: Inpatient Procedure:                Colonoscopy Indications:              Rectal bleeding, Change in bowel habits, Rectal pain                           History of tubular adenoma on colonoscopy in 2018 Providers:                Thornton Park MD, MD, Benay Pillow, RN, Benetta Spar, Technician Referring MD:              Medicines:                Monitored Anesthesia Care Complications:            No immediate complications. Estimated Blood Loss:     Estimated blood loss was minimal. Procedure:                Pre-Anesthesia Assessment:                           - Prior to the procedure, a History and Physical                            was performed, and patient medications and                            allergies were reviewed. The patient's tolerance of                            previous anesthesia was also reviewed. The risks                            and benefits of the procedure and the sedation                            options and risks were discussed with the patient.                            All questions were answered, and informed consent                            was obtained. Prior Anticoagulants: The patient has                            taken no previous anticoagulant or antiplatelet                            agents. ASA Grade Assessment: III - A patient with  severe systemic disease. After reviewing the risks                            and benefits, the patient was deemed in                            satisfactory condition to undergo the procedure.                           After obtaining informed consent, the colonoscope                            was passed under direct vision. Throughout the                             procedure, the patient's blood pressure, pulse, and                            oxygen saturations were monitored continuously. The                            CF-HQ190L ([id number] Olympus coloscope was                            introduced through the anus and advanced to the 5                            cm into the ileum. A second forward view of the                            right colon was performed. The colonoscopy was                            performed with moderate difficulty due to a                            redundant colon. Successful completion of the                            procedure was aided by applying abdominal pressure.                            The patient tolerated the procedure well. The                            quality of the bowel preparation was good. The                            terminal ileum, ileocecal valve, appendiceal                            orifice, and rectum were photographed. Scope In: 9:45:32 AM Scope Out: 10:14:26 AM Scope Withdrawal Time: 0 hours 22 minutes 38 seconds  Total  Procedure Duration: 0 hours 28 minutes 54 seconds  Findings:      Hemorrhoids were found on perianal exam.      3 ulcers were present in the distal rectum just proximal to the dentate       line. No bleeding was present. No stigmata of recent bleeding were seen.       Biopsies were taken from the ulcer margins with a cold forceps for       histology. Estimated blood loss was minimal.      Multiple small and large-mouthed diverticula were found in the sigmoid       colon and descending colon.      Two sessile polyps were found in the rectum and sigmoid colon. The       polyps were 2 to 4 mm in size. These polyps were removed with a cold       snare. Resection and retrieval were complete. Estimated blood loss was       minimal. Impression:               - Hemorrhoids found on perianal exam.                           - A few ulcers in the distal rectum.  Biopsied.                           - Diverticulosis in the sigmoid colon and in the                            descending colon.                           - Two 2 to 4 mm polyps in the rectum and in the                            sigmoid colon, removed with a cold snare. Resected                            and retrieved. Recommendation:           - Return patient to hospital ward for ongoing care.                           - Follow a high fiber diet. Drink at least 64                            ounces of water daily.                           - Continue present medications.                           - Aggressive bowel regiment to avoid constipation                            to include Relastor given chronic narcotics.                           -  Consider Carafate suppositories for 6 weeks for                            local therapy of rectal ulcers.                           - Await pathology results.                           - Repeat colonoscopy date to be determined after                            pending pathology results are reviewed for                            surveillance. If these are tubular adenomas, may                            consider no surveillance as age would be over 66 at                            that time.                           - Emerging evidence supports eating a diet of                            fruits, vegetables, grains, calcium, and yogurt                            while reducing red meat and alcohol may reduce the                            risk of colon cancer.                           No additional inpatient GI evaluation planned. I                            will arrange outpatient evaluation with Dr. Henrene Pastor. Procedure Code(s):        --- Professional ---                           [phone], Colonoscopy, flexible; with removal of                            tumor(s), polyp(s), or other lesion(s) by snare                            technique                            66063, 23, Colonoscopy, flexible; with biopsy,  single or multiple Diagnosis Code(s):        --- Professional ---                           K62.6, Ulcer of anus and rectum                           K62.1, Rectal polyp                           K63.5, Polyp of colon                           K62.5, Hemorrhage of anus and rectum                           R19.4, Change in bowel habit                           K62.89, Other specified diseases of anus and rectum                           K57.30, Diverticulosis of large intestine without                            perforation or abscess without bleeding CPT copyright 2019 American Medical Association. All rights reserved. The codes documented in this report are preliminary and upon coder review may  be revised to meet current compliance requirements. Thornton Park MD, MD 08/31/2021 10:44:59 AM This report has been signed electronically. Number of Addenda: 0

## 2021-08-31 NOTE — Anesthesia Procedure Notes (Signed)
Procedure Name: MAC Date/Time: 08/31/2021 9:25 AM  Performed by: Anastasio Auerbach, CRNAPre-anesthesia Checklist: Patient identified, Emergency Drugs available, Patient being monitored and Suction available Patient Re-evaluated:Patient Re-evaluated prior to induction Oxygen Delivery Method: Nasal cannula Induction Type: IV induction Airway Equipment and Method: Bite block

## 2021-08-31 NOTE — Progress Notes (Signed)
OT Cancellation Note  Patient Details Name: [name] MRN: [id number] DOB: [birth year]   Cancelled Treatment:    Reason Eval/Treat Not Completed: Patient at procedure or test/ unavailable (EGD)  Malka So 08/31/2021, 8:34 AM Cleta Alberts, OTR/L Acute Rehabilitation Services Office: [phone]

## 2021-08-31 NOTE — Progress Notes (Signed)
     Daily Progress Note Intern Pager: [phone]  Patient name: [name] Medical record number: [id number] Date of birth: 02/15/1950 Age: 71 y.o. Gender: female  Primary Care Provider: Alvester Chou, NP Consultants: GI Code Status: FULL  Pt Overview and Major Events to Date:  8/15 - admitted, 3u pRBC overnight  Assessment and Plan:  [name] is a 71 y.o. female p/w constipation and abdominal pain with recent hx of melena for several weeks. Had BRBPR on arrival. Unclear etiology of bleed given hx. Hgb remains stable, EGD and colonoscopy today.  * GI bleed Unclear whether upper or lower GI given mixed symptoms. ABLA with Hemoglobin of 5.3 on admission. Now stable s/p 3u pRBCs and 1L NS bolus. VSS, normotensive to hypertensive. EGD fairly unremarkable. Colonoscopy showing hemorrhoids, rectal ulcers, diverticulosis, and 2 polyps (resected). - GI consulted, appreciate recs:  -- f/u biopsies  -- high fiber diet, aggressive bowel regimen at d/c  -- repeat colonoscopy TBD pending biopsy results  -- avoid NSAIDs as able  -- Outpatient referral for pelvic floor PT  -- Use a squatty potty on discharge - IV Protonix 40mg  qd - H/H q12h - transfusion threshold 7  Constipation Possibly related to opioid use vs IBS -Bowel regimen as above -hydrocortisone suppository prn for pain  Altered mental state Unclear if baseline or new confusion, suspect due to acute illness and possible component of vascular dementia. CT head wnl. - Would review medication list, consider discontinuation of trazodone  - Will discuss with husband   Dyspnea Pt p/w some SOB with cough. Denied chest pain. Normal WBC count. Symptoms thought to be most likely related to her anemia. EKG, CXR unremarkable.  -Continue to monitor, rpt CXR if worsening or febrile -Acute hypoxemia, while sleeping, suspect possible OSA, monitor for signs of infection      FEN/GI: NPO for procedure PPx: SCDs Dispo:Home  pending clinical improvement . Barriers include endoscopy.   Subjective:  Seen after her endoscopy. Husband and granddaughter at bedside. Pt feeling well, denies pain, SOB. Feeling hungry and ready to eat. Ok with plan to stay overnight.  Objective: Temp:  [97.4 F (36.3 C)-98.2 F (36.8 C)] 97.7 F (36.5 C) (08/18 1049) Pulse Rate:  [86-95] 86 (08/18 1049) Resp:  [14-24] 14 (08/18 1049) BP: (122-167)/(42-70) 141/52 (08/18 1049) SpO2:  [90 %-98 %] 92 % (08/18 1049) Weight:  [93 kg] 93 kg (08/18 0843) Physical Exam: General: Well appearing, conversational, joking, sitting up on side of bed Cardiovascular: RRR Respiratory: CTAB Abdomen: soft, mild diffuse tenderness  Laboratory: Most recent CBC Lab Results  Component Value Date   WBC 6.2 08/30/2021   HGB 9.0 (L) 08/31/2021   HCT 30.1 (L) 08/31/2021   MCV 69.3 (L) 08/30/2021   PLT 180 08/30/2021   Most recent BMP    Latest Ref Rng & Units 08/30/2021    4:37 AM  BMP  Glucose 70 - 99 mg/dL 88   BUN 8 - 23 mg/dL 7   Creatinine 0.44 - 1.00 mg/dL 0.69   Sodium 135 - 145 mmol/L 134   Potassium 3.5 - 5.1 mmol/L 3.6   Chloride 98 - 111 mmol/L 101   CO2 22 - 32 mmol/L 27   Calcium 8.9 - 10.3 mg/dL 8.3      [name] Albino, MD 08/31/2021, 1:41 PM  PGY-1, [place] Intern pager: [phone], text pages welcome Secure chat group [place]

## 2021-08-31 NOTE — Telephone Encounter (Signed)
Patient scheduled for hospital f/u on 10/04/21 at 10:00 am with [place], [place]. Pt is currently admitted appointment will appear on discharge paperwork.

## 2021-08-31 NOTE — Anesthesia Postprocedure Evaluation (Signed)
Anesthesia Post Note  Patient: [name]  Procedure(s) Performed: ESOPHAGOGASTRODUODENOSCOPY (EGD) WITH PROPOFOL COLONOSCOPY WITH PROPOFOL POLYPECTOMY     Patient location during evaluation: Endoscopy Anesthesia Type: MAC Level of consciousness: awake and alert Pain management: pain level controlled Vital Signs Assessment: post-procedure vital signs reviewed and stable Respiratory status: spontaneous breathing and respiratory function stable Cardiovascular status: stable Postop Assessment: no apparent nausea or vomiting Anesthetic complications: no   No notable events documented.  Last Vitals:  Vitals:   08/31/21 1035 08/31/21 1049  BP: (!) 122/42 (!) 141/52  Pulse: 86 86  Resp: 20 14  Temp: 36.4 C 36.5 C  SpO2: 90% 92%    Last Pain:  Vitals:   08/31/21 1049  TempSrc: Oral  PainSc:                  Saquan Furtick DANIEL

## 2021-08-31 NOTE — TOC Benefit Eligibility Note (Signed)
Patient Teacher, English as a foreign language completed.    The patient is currently admitted and upon discharge could be taking lubiprostone (Amitiza) 24 mcg.  The current 30 day co-pay is $47.00.   The patient is currently admitted and upon discharge could be taking Relistor (methylnaltrexone) 12 mcg.  Requires Prior Authorization  The patient is insured through [place], [place] Patient Advocate Specialist [place] Patient Advocate Team Direct Number: [phone]  Fax: [phone]

## 2021-09-01 DIAGNOSIS — D125 Benign neoplasm of sigmoid colon: Secondary | ICD-10-CM | POA: Diagnosis not present

## 2021-09-01 DIAGNOSIS — D649 Anemia, unspecified: Secondary | ICD-10-CM | POA: Diagnosis not present

## 2021-09-01 DIAGNOSIS — D500 Iron deficiency anemia secondary to blood loss (chronic): Secondary | ICD-10-CM | POA: Diagnosis not present

## 2021-09-01 DIAGNOSIS — K5909 Other constipation: Secondary | ICD-10-CM

## 2021-09-01 DIAGNOSIS — I739 Peripheral vascular disease, unspecified: Secondary | ICD-10-CM | POA: Diagnosis not present

## 2021-09-01 LAB — HEMOGLOBIN AND HEMATOCRIT, BLOOD
HCT: 27.1 % — ABNORMAL LOW (ref 36.0–46.0)
Hemoglobin: 7.9 g/dL — ABNORMAL LOW (ref 12.0–15.0)

## 2021-09-01 MED ORDER — ALBUTEROL SULFATE (2.5 MG/3ML) 0.083% IN NEBU: 3.0000 mL | INHALATION_SOLUTION | RESPIRATORY_TRACT | 12 refills | Status: DC | PRN

## 2021-09-01 MED ORDER — HYDROCORTISONE ACETATE 25 MG RE SUPP: 25.0000 mg | Freq: Two times a day (BID) | RECTAL | 0 refills | Status: DC | PRN

## 2021-09-01 MED ORDER — LINACLOTIDE 290 MCG PO CAPS: 290.0000 ug | ORAL_CAPSULE | Freq: Every day | ORAL | 1 refills | Status: DC

## 2021-09-01 NOTE — Progress Notes (Signed)
     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 Unfortunately, Relastor is cost prohibitive - Motegrity 2mg  daily preferred, alternatives would be Linzess or Amitiza - I do not think she will tolerate lactulose due to bloating - Consider outpatient referral for pelvic floor PT, squatty potty - Minimize constipating medications - Drink at least 64 ounces of water daily, exercise regularly, high fiber diet - Will review results of capsule endoscopy with the patient when they are available next week - Outpatient follow-up at [facility] 10/04/21 at 10AM with [name]  Discussed with Dr. Joeseph Amor.   The inpatient GI team will move to stand-by. Please call the on-call gastroenterologist with any additional questions or concerns during this hospitalization.   Subjective: Multiple concerns today including constipation and heartburn. There has been no overt bleeding. No new complaints today. No family present at the  bedside.  Objective:  Vital signs in last 24 hours: Temp:  [97.6 F (36.4 C)-98.3 F (36.8 C)] 97.7 F (36.5 C) (08/19 0747) Pulse Rate:  [86-93] 88 (08/19 0747) Resp:  [14-24] 16 (08/19 0747) BP: (122-161)/(42-66) 158/66 (08/19 0747) SpO2:  [90 %-97 %] 94 % (08/19 0747) Last BM Date : 08/30/21 General:   Alert, in NAD, sitting in the bedside chair Heart:  Regular rate and rhythm; no murmurs Pulm: Clear anteriorly; no wheezing Abdomen:  Soft. Nontender. Nondistended. Normal bowel sounds. No rebound or guarding. LAD: No inguinal or umbilical LAD Extremities:  Without edema. Neurologic:  Alert and  oriented x4;  grossly normal neurologically. Psych:  Alert and cooperative. Normal mood and affect.   Lab Results: Recent Labs    08/30/21 0437 08/30/21 1643 08/31/21 0542 08/31/21 1812 09/01/21 0505  WBC 6.2  --   --   --   --   HGB 7.8*   < > 9.0* 8.6* 7.9*  HCT 26.2*   < > 30.1* 30.5* 27.1*  PLT 180  --   --   --   --    < > = values in this interval not displayed.   BMET Recent Labs    08/30/21 0437  NA 134*  K 3.6  CL 101  CO2 27  GLUCOSE 88  BUN 7*  CREATININE 0.69  CALCIUM 8.3*      LOS: 3 days   Thornton Park  09/01/2021, 9:09 AM

## 2021-09-01 NOTE — Discharge Summary (Addendum)
[facility] Discharge Summary  Patient name: [name] Medical record number: [id number] Date of birth: 04-Jan-1951 Age: 71 y.o. Gender: female Date of Admission: 08/28/2021  Date of Discharge: 09/01/21 Admitting Physician: Leslie Dales, MD  Primary Care Provider: Alvester Chou, NP Consultants: GI  Indication for Hospitalization: Symptomatic anemia, suspected GI bleed  Brief Hospital Course:  [name] is a 71 y.o.female with a history of chronic IDA, CVA (2017), COPD, tobacco use, colon polyps (2018), gastroparesis, and diverticular disease who was admitted to the [facility] at [facility] for acute on chronic anemia in the setting of recent melena, thought to be possible GI bleed. Her hospital course is detailed below:  GI Bleed ABLA with Hgb 5.3 on admission. Received 3u pRBC transfusion on admission and hgb improved. Hgb remained stable for the remainder of her stay and did not require additional transfusions.  GI consulted and performed bowel cleanout with Relistor, SMOG enema, and moviprep. Performed EGD and colonoscopy on 8/18.  Colonoscopy showed perianal hemorrhoids, rectal ulcers, diverticulosis in sigmoid and descending colon, and 2-50m polyps in the rectum and sigmoid colon (resected). EGD was fairly unremarkable (impressions below).   She was sent home with Linzess as well as hydrocortisone suppositories.  GI recommendations as follows: - Follow a high fiber diet. Drink at least 64 ounces of water daily. - Continue present medications. - Aggressive bowel regiment to avoid constipation to include Relastor given chronic narcotics. (Linzess, lubiprostone also ok) - Consider Carafate suppositories for 6 weeks for local therapy of rectal ulcers. - Await pathology results. - Repeat colonoscopy date to be determined after pending pathology results are reviewed for surveillance. If these are tubular adenomas, may  consider no surveillance as age would be over 89at that time. - Emerging evidence supports eating a diet of fruits, vegetables, grains, calcium, and yogurt while reducing red meat and alcohol may reduce the risk of colon cancer. - Avoid NSAIDS as able  Dyspnea Pt initially presented with some SOB and chronic cough. Had COVID several months ago. Her WBC was normal and symptoms thought to be most related to her acute anemia vs sequelae from covid. EKG, CXR unremarkable.  Altered mental state Pt was disoriented to time on arrival but correctable. CT head unremarkable. Consider discontinuation of any meds contributing to AMS.  Other chronic conditions: HTN - pt reported no home antihypertensive use  RLS - continued ropinirole  HLD - continued home rosuvastatin     COLONOSOCPY Impression: - Hemorrhoids found on perianal exam. - A few ulcers in the distal rectum. Biopsied. - Diverticulosis in the sigmoid colon and in the descending colon. - Two 2 to 4 mm polyps in the rectum and in the sigmoid colon, removed with a cold snare. Resected and retrieved.   EGD Impression: - Normal esophagus. - Erythematous mucosa in the gastric body. Biopsied. - Normal examined duodenum. Biopsied. - The examination was otherwise normal.   PCP Follow-up Recommendations: Consider tapering off opioids given her significant constipation.  Pt will need aggressive outpatient management of constipation Pt would benefit from outpatient pelvic floor PT Pt should use squatty potty at home Ensure GI f/u F/u GI biopsies Consider switch from PPI to famotidine Elevated BP during her stay, reports not taking home HP meds. Consider further w/u Pt likely needs regular outpatient iron infusions Consider discontinuing anticholinergics Consider further workup of anemia  Discharge Diagnoses/Problem List:  Principal Problem for Admission: GI bleed/symptomatic anemia Other Problems addressed during  stay:   Principal Problem:   GI bleed Active Problems:   Claudication in peripheral vascular disease (HCC)   Essential hypertension   Dyspnea   Cerebrovascular disease   Hypoxemia   Altered mental state   Constipation   Symptomatic anemia   Anemia due to chronic blood loss   Gastritis and gastroduodenitis   Iron deficiency anemia   Rectal ulcer   Adenomatous polyp of sigmoid colon  Disposition: home with [facility] PT  Discharge Condition: stable  Discharge Exam:  Vitals:   08/31/21 1948 09/01/21 0747  BP: (!) 141/50 (!) 158/66  Pulse: 87 88  Resp: 16 16  Temp: 98.1 F (36.7 C) 97.7 F (36.5 C)  SpO2: 97% 94%   Gen: Sitting up in chair, drinking, NAD  CV: RRR Pulm: CTAB Abd: soft, moderate diffuse tenderness to palpation, no guarding  Significant Procedures: EGD and Colonoscopy, see hosp course for impressions  Significant Labs and Imaging:  Recent Labs  Lab 08/31/21 0542 08/31/21 1812 09/01/21 0505  HGB 9.0* 8.6* 7.9*  HCT 30.1* 30.5* 27.1*   BMET    Component Value Date/Time   NA 134 (L) 08/30/2021 0437   NA 140 09/28/2019 0948   K 3.6 08/30/2021 0437   K 3.5 03/19/2011 0722   CL 101 08/30/2021 0437   CO2 27 08/30/2021 0437   GLUCOSE 88 08/30/2021 0437   BUN 7 (L) 08/30/2021 0437   BUN 12 09/28/2019 0948   CREATININE 0.69 08/30/2021 0437   CALCIUM 8.3 (L) 08/30/2021 0437   GFRNONAA >60 08/30/2021 0437   CBC    Component Value Date/Time   WBC 6.2 08/30/2021 0437   RBC 3.78 (L) 08/30/2021 0437   HGB 7.9 (L) 09/01/2021 0505   HGB 14.1 03/08/2020 1054   HCT 27.1 (L) 09/01/2021 0505   HCT 42.4 03/08/2020 1054   PLT 180 08/30/2021 0437   PLT 289 03/08/2020 1054   MCV 69.3 (L) 08/30/2021 0437   MCV 84 03/08/2020 1054   MCH 20.6 (L) 08/30/2021 0437   MCHC 29.8 (L) 08/30/2021 0437   RDW 23.0 (H) 08/30/2021 0437   RDW 14.3 03/08/2020 1054   LYMPHSABS 1.0 08/28/2021 1638   MONOABS 0.2 08/28/2021 1638   EOSABS 0.2 08/28/2021 1638   BASOSABS 0.0 08/28/2021  1638    Pertinent Imaging   DG ABD 2 VIEWS (8/15)  IMPRESSION: 1. Nonobstructive bowel gas pattern. 2. Large amount of stool throughout the colon and within the rectum.  CT HEAD WO CONTRAST (8/16) IMPRESSION: No acute intracranial abnormality.  Results/Tests Pending at Time of Discharge: GI biopsies  Discharge Medications:  Allergies as of 09/01/2021       Reactions   Bee Venom Anaphylaxis   Morphine And Related Other (See Comments)   Overly sedated   Neurontin [gabapentin] Other (See Comments)   Nightmares    Codeine Hives   Adhesive [tape] Itching, Other (See Comments)   Redness, Paper tape is ok   Sulfa Antibiotics Other (See Comments)   Childhood allergy Unknown reaction   Sulfonamide Derivatives Other (See Comments)   Childhood allergy Unknown reaction        Medication List     STOP taking these medications    albuterol 108 (90 Base) MCG/ACT inhaler Commonly known as: VENTOLIN HFA Replaced by: albuterol (2.5 MG/3ML) 0.083% nebulizer solution   naproxen sodium 220 MG tablet Commonly known as: ALEVE   Oxycodone HCl 10 MG Tabs       TAKE these medications  albuterol (2.5 MG/3ML) 0.083% nebulizer solution Commonly known as: PROVENTIL Inhale 3 mLs into the lungs every 4 (four) hours as needed for wheezing or shortness of breath (cough). Replaces: albuterol 108 (90 Base) MCG/ACT inhaler   aspirin EC 325 MG tablet Take 325 mg by mouth at bedtime.   bisacodyl 10 MG/30ML Enem Commonly known as: FLEET Place 10 mg rectally once as needed (constipation).   busPIRone 5 MG tablet Commonly known as: BUSPAR Take 1 tablet (5 mg total) by mouth 2 (two) times daily. What changed: when to take this   Cyanocobalamin 1000 MCG/ML Kit Inject 1,000 mcg into the muscle every 30 (thirty) days.   docusate sodium 100 MG capsule Commonly known as: COLACE Take 200 mg by mouth daily as needed (constipation).   DULoxetine 60 MG capsule Commonly known as:  CYMBALTA Take 60 mg by mouth at bedtime.   hydrocortisone 25 MG suppository Commonly known as: ANUSOL-HC Place 1 suppository (25 mg total) rectally 2 (two) times daily as needed for hemorrhoids or anal itching.   linaclotide 290 MCG Caps capsule Commonly known as: Linzess Take 1 capsule (290 mcg total) by mouth daily before breakfast.   methocarbamol 500 MG tablet Commonly known as: Robaxin Take 1 tablet (500 mg total) by mouth every 6 (six) hours as needed for muscle spasms. What changed: when to take this   metoCLOPramide 5 MG tablet Commonly known as: REGLAN Take 1 tablet by mouth 3 times daily 20-30 minutes before meals NO FURTHER REFILLS UNTIL SEEN, NEEDS AND APPOINTMENT What changed:  how much to take how to take this when to take this additional instructions   nitroGLYCERIN 0.4 MG SL tablet Commonly known as: NITROSTAT Place 0.4 mg under the tongue every 5 (five) minutes x 3 doses as needed for chest pain.   pantoprazole 40 MG tablet Commonly known as: PROTONIX Take 40 mg by mouth daily as needed (acid reflux).   rOPINIRole 1 MG tablet Commonly known as: REQUIP Take 1 mg by mouth 2 (two) times daily.   rosuvastatin 10 MG tablet Commonly known as: CRESTOR Take 10 mg by mouth at bedtime.   senna 8.6 MG tablet Commonly known as: SENOKOT Take 17.2 mg by mouth daily as needed for constipation.   traZODone 50 MG tablet Commonly known as: DESYREL TAKE 0.5-1 TABLETS (25-50 MG TOTAL) BY MOUTH AT BEDTIME AS NEEDED FOR SLEEP. What changed:  how much to take when to take this   VITAMIN D-3 PO Take 1 capsule by mouth daily.               Durable Medical Equipment  (From admission, onward)           Start     Ordered   09/01/21 1137  For home use only DME Nebulizer machine  Once       Question Answer Comment  Patient needs a nebulizer to treat with the following condition COPD (chronic obstructive pulmonary disease) ([place])   Length of Need Lifetime       09/01/21 1137            Discharge Instructions: Please refer to Patient Instructions section of EMR for full details.  Patient was counseled important signs and symptoms that should prompt return to medical care, changes in medications, dietary instructions, activity restrictions, and follow up appointments.   Follow-Up Appointments:  Follow-up Information     Alvester Chou, NP. Schedule an appointment as soon as possible for a visit.   Specialty: Nurse  Practitioner Why: Please make an appointment to be seen in the next week. Contact information: [facility] [place] [address] [place], [place] Follow up.   Specialty: [place] Why: they will call you to set up home health services Contact information: [place] [address] [phone]         Levin Erp, [place]. Go on 10/04/2021.   Specialty: Gastroenterology Why: Go to your scheduled appointment at [place]. Please arrive at least 15 minutes early. Contact information: [place] [address] [phone]                 [name], MD 09/01/2021, 11:35 AM PGY-1, [place] Upper-Level Resident Addendum   I have independently interviewed and examined the patient. I have discussed the above with the original author and agree with their documentation. My edits for correction/addition/clarification are included where appropriate. Please see also any attending notes.   Sharion Settler, DO PGY-3, [facility] 09/01/2021 12:23 PM  FPTS Service pager: [phone] (text pages welcome through [facility])

## 2021-09-01 NOTE — Progress Notes (Signed)
Occupational Therapy Treatment and Discharge Patient Details Name: [name] MRN: [id number] DOB: Jan 11, 1951 Today's Date: 09/01/2021   History of present illness Pt is a 71 y/o female presenting on 8/15 with constipation and abdominal pain, hx of melena for several weeks. Admitted for GI bleed, s/p 3u pRBCs.s/p upper endoscopy, colonoscopy showing diverticulosis and 2 polyps removed in rectum and sigmoid colon on 8/18. Also with alerted mental state, CT negative for acute chages. PMH includes: anxiety, COPD, DDD, HTN, iron deficiency anemia, L sided weakness, OA, paynaud's phenomenon, seizures.   OT comments  Pt ambulated around her room gathering items for ADLs independently. Toileted, groomed and dressed independently. Reinforced energy conservation strategies, pt verbalized understanding. Goals met, no further OT needs.    Recommendations for follow up therapy are one component of a multi-disciplinary discharge planning process, led by the attending physician.  Recommendations may be updated based on patient status, additional functional criteria and insurance authorization.    Follow Up Recommendations  No OT follow up    Assistance Recommended at Discharge PRN  Patient can return home with the following  Assist for transportation   Equipment Recommendations  None recommended by OT    Recommendations for Other Services      Precautions / Restrictions Precautions Precautions: None Restrictions Weight Bearing Restrictions: No       Mobility Bed Mobility Overal bed mobility: Modified Independent             General bed mobility comments: HOB up    Transfers Overall transfer level: Independent Equipment used: None                     Balance                                           ADL either performed or assessed with clinical judgement   ADL Overall ADL's : Independent                         Toilet Transfer:  Independent   Toileting- Water quality scientist and Hygiene: Independent       Functional mobility during ADLs: Independent      Extremity/Trunk Assessment              Vision       Perception     Praxis      Cognition Arousal/Alertness: Awake/alert Behavior During Therapy: WFL for tasks assessed/performed Overall Cognitive Status: Within Functional Limits for tasks assessed                                          Exercises      Shoulder Instructions       General Comments      Pertinent Vitals/ Pain       Pain Assessment Pain Assessment: No/denies pain  Home Living                                          Prior Functioning/Environment              Frequency  Min 2X/week        Progress Toward  Goals  OT Goals(current goals can now be found in the care plan section)  Progress towards OT goals: Goals met/education completed, patient discharged from [place] Discharge plan remains appropriate    Co-evaluation                 AM-PAC OT "6 Clicks" Daily Activity     Outcome Measure   Help from another person eating meals?: None Help from another person taking care of personal grooming?: None Help from another person toileting, which includes using toliet, bedpan, or urinal?: None Help from another person bathing (including washing, rinsing, drying)?: None Help from another person to put on and taking off regular upper body clothing?: None Help from another person to put on and taking off regular lower body clothing?: None 6 Click Score: 24    End of Session    OT Visit Diagnosis: Muscle weakness (generalized) (M62.81)   Activity Tolerance Patient tolerated treatment well   Patient Left in chair;with call bell/phone within reach;with nursing/sitter in room   Nurse Communication Mobility status (ok to give coffee)        Time: 9597-4718 OT Time Calculation (min): 20 min  Charges:  OT General Charges $OT Visit: 1 Visit OT Treatments $Self Care/Home Management : 8-22 mins  Cleta Alberts, OTR/L Acute Rehabilitation Services Office: [phone]  [name] 09/01/2021, 9:18 AM

## 2021-09-01 NOTE — TOC Transition Note (Signed)
Transition of Care [facility]) - CM/SW Discharge Note   Patient Details  Name: [name] MRN: [id number] Date of Birth: 1950/04/19  Transition of Care [facility] CM/SW Contact:  Carles Collet, RN Phone Number: 09/01/2021, 12:11 PM   Clinical Narrative:    Damaris Schooner w patient over th ephone. She states that she does not have a nebulizer at home, ordered neb meds for DC. CM placed order for nebulizer and [employer] will deliver to the room.  Discussed HH needs. Added RN to order for disease management for COPD. Patient was not aware that she had a COPD diagnosis, and is now needing neb treatments, HH RN can provide disease educate and monitor neb usage once home. [facility] assigned, patient did not have preference. Spouse to transport home    Final next level of care: [place] Barriers to Discharge: No Barriers Identified   Patient Goals and CMS Choice Patient states their goals for this hospitalization and ongoing recovery are:: to go home CMS Medicare.gov Compare Post Acute Care list provided to:: Patient Choice offered to / list presented to : Patient  Discharge Placement                       Discharge Plan and Services                DME Arranged: Nebulizer machine DME Agency: [employer] Date DME Agency Contacted: 09/01/21 Time DME Agency Contacted: 27 Representative spoke with at DME Agency: [place]: PT, RN [place] Agency: [place] Date [place]: 09/01/21 Time [place]: 1211 Representative spoke with at [place]: Angie  Social Determinants of Health ([place]) Interventions     Readmission Risk Interventions     No data to display

## 2021-09-01 NOTE — Progress Notes (Signed)
Pt provided with education on follow up appointments and medication regimen. Pt confirmed understanding verbally and is in agreement with discharge plan. VSS, Spouse at the bed side for transportation home.

## 2021-09-01 NOTE — Discharge Instructions (Addendum)
Dear [name],  Thank you for letting us participate in your care. You were hospitalized because of your low hemoglobin count (anemia). You received a blood transfusion while you were here. You also received upper and lower GI endoscopies (EGD and colonoscopy). Your colonoscopy showed some diverticulosis, ulcers in your rectum, and hemorrhoids. It also showed two polyps which were removed.  POST-HOSPITAL & CARE INSTRUCTIONS It is very important that you maintain a high-fiber diet to help keep your bowels clear. Please also take the medications we prescribed for constipation. Please review the included medication list for any changes or additions to your medications. Go to your follow up appointments (listed below)   DOCTOR'S APPOINTMENT   Future Appointments  Date Time Provider [place]  10/04/2021 10:00 AM [place], Lavone Nian, PA [facility]    Follow-up Information     Alvester Chou, NP. Schedule an appointment as soon as possible for a visit.   Specialty: Nurse Practitioner Why: Please make an appointment to be seen in the next week. Contact information: [facility] [place] [address] [phone]                 Take care and be well!  [facility]  [place], [place] [address] [phone]

## 2021-09-03 ENCOUNTER — Encounter (HOSPITAL_COMMUNITY): Payer: Self-pay | Admitting: Gastroenterology

## 2021-09-04 LAB — SURGICAL PATHOLOGY

## 2021-09-05 ENCOUNTER — Encounter: Payer: Self-pay | Admitting: *Deleted

## 2021-09-05 DIAGNOSIS — Z006 Encounter for examination for normal comparison and control in clinical research program: Secondary | ICD-10-CM

## 2021-09-19 ENCOUNTER — Telehealth: Payer: Self-pay | Admitting: Gastroenterology

## 2021-09-19 NOTE — Telephone Encounter (Signed)
Patient called, states she is having severe diarrhea after her procedure. Requesting a call back as soon as possible. Please call to advise.

## 2021-09-19 NOTE — Telephone Encounter (Signed)
Left detailed message for patient regarding MD recommendations (patient request) and advised she call back with any further questions/concerns.

## 2021-09-19 NOTE — Telephone Encounter (Signed)
Patient called in with complaints of frequent diarrhea & weakness that started Sunday when she started Linzess. She is currently taking 290 mg of Linzess. I've advised her to drink plenty of fluids, stick with a bland diet & hold off on taking any further linzess until Dr. Tarri Glenn can provide further recommendations. She is scheduled for a follow up with Anderson [place], [place] on 10/04/21.

## 2021-09-19 NOTE — Telephone Encounter (Signed)
Patient stated she has been waiting for a call back and has not heard anything. Patient stated she has to leave the house and if you call to pleases leave a message. Patient stated that she has been having diarrhea since she left the hospital on 8/19. Patient is wanting to know If there is anything that can be called in for her to help. Please advise.

## 2021-09-19 NOTE — Research (Signed)
lATE ENTRY:   75M visit with [name] for the Transcend study   She is doing better was in hospital for a GI bleed but making progress to get better. No chest pains or shortness of breath.  Follow up is by phone.  No changes to her cardiac meds per patient.  Thanked her for being in the study.   Philemon Kingdom :) RN BSN  Clinical Research Nurse  Be strong and take heart, all you who hope in the Lord. ~ Psalm 31:24   Current Outpatient Medications:    albuterol (PROVENTIL) (2.5 MG/3ML) 0.083% nebulizer solution, Inhale 3 mLs into the lungs every 4 (four) hours as needed for wheezing or shortness of breath (cough)., Disp: 75 mL, Rfl: 12   aspirin 325 MG EC tablet, Take 325 mg by mouth at bedtime., Disp: , Rfl:    bisacodyl (FLEET) 10 MG/30ML ENEM, Place 10 mg rectally once as needed (constipation)., Disp: , Rfl:    busPIRone (BUSPAR) 5 MG tablet, Take 1 tablet (5 mg total) by mouth 2 (two) times daily. (Patient taking differently: Take 5 mg by mouth at bedtime.), Disp: 60 tablet, Rfl: 0   Cholecalciferol (VITAMIN D-3 PO), Take 1 capsule by mouth daily., Disp: , Rfl:    Cyanocobalamin 1000 MCG/ML KIT, Inject 1,000 mcg into the muscle every 30 (thirty) days., Disp: , Rfl:    docusate sodium (COLACE) 100 MG capsule, Take 200 mg by mouth daily as needed (constipation)., Disp: , Rfl:    DULoxetine (CYMBALTA) 60 MG capsule, Take 60 mg by mouth at bedtime., Disp: , Rfl:    hydrocortisone (ANUSOL-HC) 25 MG suppository, Place 1 suppository (25 mg total) rectally 2 (two) times daily as needed for hemorrhoids or anal itching., Disp: 12 suppository, Rfl: 0   linaclotide (LINZESS) 290 MCG CAPS capsule, Take 1 capsule (290 mcg total) by mouth daily before breakfast., Disp: 30 capsule, Rfl: 1   methocarbamol (ROBAXIN) 500 MG tablet, Take 1 tablet (500 mg total) by mouth every 6 (six) hours as needed for muscle spasms. (Patient taking differently: Take 500 mg by mouth at bedtime.), Disp: 60 tablet,  Rfl: 0   metoCLOPramide (REGLAN) 5 MG tablet, Take 1 tablet by mouth 3 times daily 20-30 minutes before meals NO FURTHER REFILLS UNTIL SEEN, NEEDS AND APPOINTMENT (Patient taking differently: Take 5 mg by mouth daily before supper.), Disp: 90 tablet, Rfl: 0   nitroGLYCERIN (NITROSTAT) 0.4 MG SL tablet, Place 0.4 mg under the tongue every 5 (five) minutes x 3 doses as needed for chest pain., Disp: , Rfl: 1   pantoprazole (PROTONIX) 40 MG tablet, Take 40 mg by mouth daily as needed (acid reflux)., Disp: , Rfl:    rOPINIRole (REQUIP) 1 MG tablet, Take 1 mg by mouth 2 (two) times daily., Disp: , Rfl:    rosuvastatin (CRESTOR) 10 MG tablet, Take 10 mg by mouth at bedtime., Disp: , Rfl:    senna (SENOKOT) 8.6 MG tablet, Take 17.2 mg by mouth daily as needed for constipation., Disp: , Rfl:    traZODone (DESYREL) 50 MG tablet, TAKE 0.5-1 TABLETS (25-50 MG TOTAL) BY MOUTH AT BEDTIME AS NEEDED FOR SLEEP. (Patient taking differently: Take 25 mg by mouth at bedtime.), Disp: 90 tablet, Rfl: 1

## 2021-09-19 NOTE — Telephone Encounter (Signed)
Yes, hold Linzess (which has the side effect of diarrhea)

## 2021-09-19 NOTE — Research (Signed)
Study completion for the Transcend study

## 2021-09-30 ENCOUNTER — Emergency Department (HOSPITAL_COMMUNITY): Payer: Medicare Other

## 2021-09-30 ENCOUNTER — Inpatient Hospital Stay (HOSPITAL_COMMUNITY)
Admission: EM | Admit: 2021-09-30 | Discharge: 2021-10-03 | DRG: 871 | Disposition: A | Discharge status: Home Health | Payer: Medicare Other | Attending: Family Medicine | Admitting: Family Medicine

## 2021-09-30 ENCOUNTER — Encounter (HOSPITAL_COMMUNITY): Payer: Self-pay | Admitting: Emergency Medicine

## 2021-09-30 ENCOUNTER — Other Ambulatory Visit: Payer: Self-pay

## 2021-09-30 DIAGNOSIS — E669 Obesity, unspecified: Secondary | ICD-10-CM | POA: Diagnosis present

## 2021-09-30 DIAGNOSIS — Z20822 Contact with and (suspected) exposure to covid-19: Secondary | ICD-10-CM | POA: Diagnosis present

## 2021-09-30 DIAGNOSIS — Z885 Allergy status to narcotic agent status: Secondary | ICD-10-CM

## 2021-09-30 DIAGNOSIS — Z7982 Long term (current) use of aspirin: Secondary | ICD-10-CM

## 2021-09-30 DIAGNOSIS — F419 Anxiety disorder, unspecified: Secondary | ICD-10-CM | POA: Diagnosis present

## 2021-09-30 DIAGNOSIS — Z882 Allergy status to sulfonamides status: Secondary | ICD-10-CM

## 2021-09-30 DIAGNOSIS — E785 Hyperlipidemia, unspecified: Secondary | ICD-10-CM | POA: Diagnosis present

## 2021-09-30 DIAGNOSIS — J441 Chronic obstructive pulmonary disease with (acute) exacerbation: Secondary | ICD-10-CM | POA: Diagnosis present

## 2021-09-30 DIAGNOSIS — Z79899 Other long term (current) drug therapy: Secondary | ICD-10-CM

## 2021-09-30 DIAGNOSIS — Z6837 Body mass index (BMI) 37.0-37.9, adult: Secondary | ICD-10-CM

## 2021-09-30 DIAGNOSIS — J189 Pneumonia, unspecified organism: Secondary | ICD-10-CM | POA: Diagnosis not present

## 2021-09-30 DIAGNOSIS — D509 Iron deficiency anemia, unspecified: Secondary | ICD-10-CM | POA: Diagnosis present

## 2021-09-30 DIAGNOSIS — A419 Sepsis, unspecified organism: Secondary | ICD-10-CM | POA: Diagnosis not present

## 2021-09-30 DIAGNOSIS — Z9862 Peripheral vascular angioplasty status: Secondary | ICD-10-CM

## 2021-09-30 DIAGNOSIS — K5900 Constipation, unspecified: Secondary | ICD-10-CM | POA: Diagnosis present

## 2021-09-30 DIAGNOSIS — Z8541 Personal history of malignant neoplasm of cervix uteri: Secondary | ICD-10-CM

## 2021-09-30 DIAGNOSIS — R6520 Severe sepsis without septic shock: Secondary | ICD-10-CM | POA: Diagnosis present

## 2021-09-30 DIAGNOSIS — R079 Chest pain, unspecified: Secondary | ICD-10-CM

## 2021-09-30 DIAGNOSIS — J9601 Acute respiratory failure with hypoxia: Secondary | ICD-10-CM | POA: Diagnosis present

## 2021-09-30 DIAGNOSIS — Z91030 Bee allergy status: Secondary | ICD-10-CM

## 2021-09-30 DIAGNOSIS — F17210 Nicotine dependence, cigarettes, uncomplicated: Secondary | ICD-10-CM | POA: Diagnosis present

## 2021-09-30 DIAGNOSIS — J440 Chronic obstructive pulmonary disease with acute lower respiratory infection: Secondary | ICD-10-CM | POA: Diagnosis present

## 2021-09-30 DIAGNOSIS — I10 Essential (primary) hypertension: Secondary | ICD-10-CM | POA: Diagnosis present

## 2021-09-30 DIAGNOSIS — K649 Unspecified hemorrhoids: Secondary | ICD-10-CM | POA: Diagnosis present

## 2021-09-30 DIAGNOSIS — K219 Gastro-esophageal reflux disease without esophagitis: Secondary | ICD-10-CM | POA: Diagnosis present

## 2021-09-30 DIAGNOSIS — R0602 Shortness of breath: Secondary | ICD-10-CM | POA: Diagnosis present

## 2021-09-30 DIAGNOSIS — G2581 Restless legs syndrome: Secondary | ICD-10-CM | POA: Diagnosis present

## 2021-09-30 DIAGNOSIS — J181 Lobar pneumonia, unspecified organism: Secondary | ICD-10-CM | POA: Diagnosis present

## 2021-09-30 DIAGNOSIS — R7303 Prediabetes: Secondary | ICD-10-CM | POA: Diagnosis present

## 2021-09-30 DIAGNOSIS — Z888 Allergy status to other drugs, medicaments and biological substances status: Secondary | ICD-10-CM

## 2021-09-30 DIAGNOSIS — E872 Acidosis, unspecified: Secondary | ICD-10-CM | POA: Diagnosis present

## 2021-09-30 DIAGNOSIS — Z8673 Personal history of transient ischemic attack (TIA), and cerebral infarction without residual deficits: Secondary | ICD-10-CM

## 2021-09-30 DIAGNOSIS — F32A Depression, unspecified: Secondary | ICD-10-CM | POA: Diagnosis present

## 2021-09-30 LAB — LACTIC ACID, PLASMA
Lactic Acid, Venous: 2.9 mmol/L (ref 0.5–1.9)
Lactic Acid, Venous: 1.3 mmol/L (ref 0.5–1.9)

## 2021-09-30 LAB — CBC WITH DIFFERENTIAL/PLATELET
Abs Immature Granulocytes: 0.09 10*3/uL — ABNORMAL HIGH (ref 0.00–0.07)
Basophils Absolute: 0 10*3/uL (ref 0.0–0.1)
Basophils Relative: 0 %
Eosinophils Absolute: 0 10*3/uL (ref 0.0–0.5)
Eosinophils Relative: 0 %
HCT: 33.6 % — ABNORMAL LOW (ref 36.0–46.0)
Hemoglobin: 9.6 g/dL — ABNORMAL LOW (ref 12.0–15.0)
Immature Granulocytes: 1 %
Lymphocytes Relative: 5 %
Lymphs Abs: 0.9 10*3/uL (ref 0.7–4.0)
MCH: 20.6 pg — ABNORMAL LOW (ref 26.0–34.0)
MCHC: 28.6 g/dL — ABNORMAL LOW (ref 30.0–36.0)
MCV: 72.3 fL — ABNORMAL LOW (ref 80.0–100.0)
Monocytes Absolute: 0.9 10*3/uL (ref 0.1–1.0)
Monocytes Relative: 5 %
Neutro Abs: 15.1 10*3/uL — ABNORMAL HIGH (ref 1.7–7.7)
Neutrophils Relative %: 89 %
Platelets: 253 10*3/uL (ref 150–400)
RBC: 4.65 MIL/uL (ref 3.87–5.11)
RDW: 23.3 % — ABNORMAL HIGH (ref 11.5–15.5)
WBC: 17 10*3/uL — ABNORMAL HIGH (ref 4.0–10.5)
nRBC: 0 % (ref 0.0–0.2)

## 2021-09-30 LAB — SARS CORONAVIRUS 2 BY RT PCR: SARS Coronavirus 2 by RT PCR: NEGATIVE

## 2021-09-30 LAB — COMPREHENSIVE METABOLIC PANEL
ALT: 13 U/L (ref 0–44)
AST: 19 U/L (ref 15–41)
Albumin: 3.4 g/dL — ABNORMAL LOW (ref 3.5–5.0)
Alkaline Phosphatase: 110 U/L (ref 38–126)
Anion gap: 12 (ref 5–15)
BUN: 11 mg/dL (ref 8–23)
CO2: 20 mmol/L — ABNORMAL LOW (ref 22–32)
Calcium: 8.8 mg/dL — ABNORMAL LOW (ref 8.9–10.3)
Chloride: 100 mmol/L (ref 98–111)
Creatinine, Ser: 0.97 mg/dL (ref 0.44–1.00)
GFR, Estimated: >60 mL/min (ref >60)
Glucose, Bld: 137 mg/dL — ABNORMAL HIGH (ref 70–99)
Potassium: 3.9 mmol/L (ref 3.5–5.1)
Sodium: 132 mmol/L — ABNORMAL LOW (ref 135–145)
Total Bilirubin: 0.2 mg/dL — ABNORMAL LOW (ref 0.3–1.2)
Total Protein: 7.4 g/dL (ref 6.5–8.1)

## 2021-09-30 LAB — TROPONIN I (HIGH SENSITIVITY)
Troponin I (High Sensitivity): 8 ng/L
Troponin I (High Sensitivity): 9 ng/L (ref <18)

## 2021-09-30 MED ORDER — PANTOPRAZOLE SODIUM 40 MG PO TBEC: 40.0000 mg | DELAYED_RELEASE_TABLET | Freq: Every day | ORAL | Status: DC | PRN

## 2021-09-30 MED ORDER — VANCOMYCIN HCL IN DEXTROSE 1-5 GM/200ML-% IV SOLN
1000.0000 mg | Freq: Once | INTRAVENOUS | Status: DC
  Filled 2021-09-30: qty 200

## 2021-09-30 MED ORDER — IBUPROFEN 400 MG PO TABS: 600.0000 mg | ORAL_TABLET | Freq: Four times a day (QID) | ORAL | Status: DC | PRN

## 2021-09-30 MED ORDER — BUSPIRONE HCL 5 MG PO TABS
5.0000 mg | ORAL_TABLET | Freq: Two times a day (BID) | ORAL | Status: DC
  Administered 2021-09-30 – 2021-10-03 (×6): 5 mg via ORAL
  Filled 2021-09-30 (×6): qty 1

## 2021-09-30 MED ORDER — SODIUM CHLORIDE 0.9 % IV SOLN
2.0000 g | Freq: Three times a day (TID) | INTRAVENOUS | Status: DC
  Administered 2021-09-30 – 2021-10-01 (×3): 2 g via INTRAVENOUS
  Filled 2021-09-30 (×3): qty 12.5

## 2021-09-30 MED ORDER — FENTANYL CITRATE PF 50 MCG/ML IJ SOSY
50.0000 ug | PREFILLED_SYRINGE | Freq: Once | INTRAMUSCULAR | Status: AC
  Administered 2021-09-30: 50 ug via INTRAVENOUS
  Filled 2021-09-30: qty 1

## 2021-09-30 MED ORDER — SODIUM CHLORIDE 0.9 % IV BOLUS
2000.0000 mL | Freq: Once | INTRAVENOUS | Status: AC
  Administered 2021-09-30: 2000 mL via INTRAVENOUS

## 2021-09-30 MED ORDER — IBUPROFEN 200 MG PO TABS
600.0000 mg | ORAL_TABLET | Freq: Once | ORAL | Status: DC
  Filled 2021-09-30: qty 3

## 2021-09-30 MED ORDER — IPRATROPIUM-ALBUTEROL 0.5-2.5 (3) MG/3ML IN SOLN: 3.0000 mL | RESPIRATORY_TRACT | Status: DC | PRN

## 2021-09-30 MED ORDER — NICOTINE 21 MG/24HR TD PT24
21.0000 mg | MEDICATED_PATCH | Freq: Every day | TRANSDERMAL | Status: DC
  Administered 2021-10-01 – 2021-10-03 (×3): 21 mg via TRANSDERMAL
  Filled 2021-09-30 (×3): qty 1

## 2021-09-30 MED ORDER — ENOXAPARIN SODIUM 40 MG/0.4ML IJ SOSY
40.0000 mg | PREFILLED_SYRINGE | INTRAMUSCULAR | Status: DC
  Administered 2021-10-01 – 2021-10-03 (×3): 40 mg via SUBCUTANEOUS
  Filled 2021-09-30 (×3): qty 0.4

## 2021-09-30 MED ORDER — DULOXETINE HCL 60 MG PO CPEP
60.0000 mg | ORAL_CAPSULE | Freq: Every day | ORAL | Status: DC
  Administered 2021-09-30 – 2021-10-02 (×3): 60 mg via ORAL
  Filled 2021-09-30 (×3): qty 1

## 2021-09-30 MED ORDER — HYDROCORTISONE ACETATE 25 MG RE SUPP: 25.0000 mg | Freq: Two times a day (BID) | RECTAL | Status: DC | PRN

## 2021-09-30 MED ORDER — ACETAMINOPHEN 325 MG PO TABS
650.0000 mg | ORAL_TABLET | Freq: Four times a day (QID) | ORAL | Status: DC | PRN
  Administered 2021-10-01: 650 mg via ORAL
  Filled 2021-09-30: qty 2

## 2021-09-30 MED ORDER — ROSUVASTATIN CALCIUM 5 MG PO TABS
10.0000 mg | ORAL_TABLET | Freq: Every day | ORAL | Status: DC
  Administered 2021-10-01 – 2021-10-02 (×2): 10 mg via ORAL
  Filled 2021-09-30 (×2): qty 2

## 2021-09-30 NOTE — Progress Notes (Signed)
FMTS Interim Progress Note  S:Went to bedside along with Dr. Jerilee Hoh to check on patient, she was resting comfortably. Reports chest pain when she coughs which is the same since admission. Otherwise denies any worsening of symptoms since admission, says that her dyspnea has improved. Denies the development of any other symptoms over the past few weeks leading up to this hospitalization. She does not use oxygen supplementation at home.   O: BP (!) 153/70   Pulse (!) 119   Temp 98.6 F (37 C) (Oral)   Resp (!) 33   SpO2 100%   General: Patient sitting comfortably in bed, in no acute distress. CV: RRR, no murmurs or gallops auscultated Resp: CTAB, no wheezing, rales or rhonchi noted, breathing comfortably on 2L O2  Ext: no LE edema noted bilaterally, distal pulses present bilaterally, no calf tenderness noted   A/P: Patient admitted for dyspnea and met sepsis criteria upon arrival, CXR demonstrated possible pneumonia within superior segment in the right lower lobe. Continue cefepime. Blood cultures pending. COVID testing negative. Vitals appropriate in the room, pulse 100 and satting appropriately. Telemetry reviewed. Orders reviewed. Will monitor closely, may consider starting broad spectrum antibiotics or steroids if clinically worsens. Otherwise, remainder of plan per day team.   Donney Dice, DO 09/30/2021, 8:34 PM PGY-3, [facility] pager [phone]

## 2021-09-30 NOTE — ED Provider Notes (Signed)
[facility]PART[facility] Provider Note   CSN: [id number] Arrival date & time: 09/30/21  1515     History  Chief Complaint  Patient presents with   Chest Pain    [name] is a 71 y.o. female history of previous stroke, COPD, gastroparesis, here presenting with chest pain and chills and cough.  Patient was recently admitted for GI bleed and required transfusion.  Patient states that for the last 3 to 4 days, she has been having productive cough.  She also has some chest pain when she coughs.  Also has subjective chills as well.   The history is provided by the patient.       Home Medications Prior to Admission medications   Medication Sig Start Date End Date Taking? Authorizing Provider  albuterol (PROVENTIL) (2.5 MG/3ML) 0.083% nebulizer solution Inhale 3 mLs into the lungs every 4 (four) hours as needed for wheezing or shortness of breath (cough). 09/01/21   Sharion Settler, DO  aspirin 325 MG EC tablet Take 325 mg by mouth at bedtime.    [provider]  bisacodyl (FLEET) 10 MG/30ML ENEM Place 10 mg rectally once as needed (constipation).    [provider]  busPIRone (BUSPAR) 5 MG tablet Take 1 tablet (5 mg total) by mouth 2 (two) times daily. Patient taking differently: Take 5 mg by mouth at bedtime. 02/18/18   Ursula Alert, MD  Cholecalciferol (VITAMIN D-3 PO) Take 1 capsule by mouth daily.    [provider]  Cyanocobalamin 1000 MCG/ML KIT Inject 1,000 mcg into the muscle every 30 (thirty) days.    [provider]  docusate sodium (COLACE) 100 MG capsule Take 200 mg by mouth daily as needed (constipation).    [provider]  DULoxetine (CYMBALTA) 60 MG capsule Take 60 mg by mouth at bedtime.    [provider]  hydrocortisone (ANUSOL-HC) 25 MG suppository Place 1 suppository (25 mg total) rectally 2 (two) times daily as needed for hemorrhoids or anal itching. 09/01/21   Sharion Settler, DO  linaclotide (LINZESS) 290 MCG CAPS capsule Take 1 capsule (290 mcg total) by mouth daily before breakfast. 09/01/21   Sharion Settler, DO  methocarbamol (ROBAXIN) 500 MG tablet Take 1 tablet (500 mg total) by mouth every 6 (six) hours as needed for muscle spasms. Patient taking differently: Take 500 mg by mouth at bedtime. 10/13/17   Leighton Parody, PA-C  metoCLOPramide (REGLAN) 5 MG tablet Take 1 tablet by mouth 3 times daily 20-30 minutes before meals NO FURTHER REFILLS UNTIL SEEN, NEEDS AND APPOINTMENT Patient taking differently: Take 5 mg by mouth daily before supper. 02/19/21   Irene Shipper, MD  nitroGLYCERIN (NITROSTAT) 0.4 MG SL tablet Place 0.4 mg under the tongue every 5 (five) minutes x 3 doses as needed for chest pain. 05/17/16   [provider]  pantoprazole (PROTONIX) 40 MG tablet Take 40 mg by mouth daily as needed (acid reflux).    [provider]  rOPINIRole (REQUIP) 1 MG tablet Take 1 mg by mouth 2 (two) times daily.    [provider]  rosuvastatin (CRESTOR) 10 MG tablet Take 10 mg by mouth at bedtime. 06/10/20   [provider]  senna (SENOKOT) 8.6 MG tablet Take 17.2 mg by mouth daily as needed for constipation.    [provider]  traZODone (DESYREL) 50 MG tablet TAKE 0.5-1 TABLETS (25-50 MG TOTAL) BY MOUTH AT BEDTIME AS NEEDED FOR SLEEP. Patient taking differently:  Take 25 mg by mouth at bedtime. 03/16/18   Ursula Alert, MD      Allergies    Bee venom, Morphine and related, Neurontin [gabapentin], Codeine, Adhesive [tape], Sulfa antibiotics, and Sulfonamide derivatives    Review of Systems   Review of Systems  Respiratory:  Positive for shortness of breath.   Cardiovascular:  Positive for chest pain.  All other systems reviewed and are negative.   Physical Exam Updated Vital Signs BP (!) 153/70   Pulse (!) 119   Temp 99.7 F (37.6 C) (Oral)   Resp (!) 31   SpO2 94%  Physical Exam Vitals and nursing  note reviewed.  Constitutional:      Comments: Tachypneic, chronically ill  HENT:     Head: Normocephalic.  Eyes:     Extraocular Movements: Extraocular movements intact.     Pupils: Pupils are equal, round, and reactive to light.  Cardiovascular:     Rate and Rhythm: Normal rate and regular rhythm.     Heart sounds: Normal heart sounds.  Pulmonary:     Comments: Crackles R base  Abdominal:     General: Bowel sounds are normal.     Palpations: Abdomen is soft.  Musculoskeletal:        General: Normal range of motion.     Cervical back: Normal range of motion and neck supple.  Skin:    General: Skin is warm.  Neurological:     General: No focal deficit present.     Mental Status: She is alert and oriented to person, place, and time.  Psychiatric:        Mood and Affect: Mood normal.        Behavior: Behavior normal.     ED Results / Procedures / Treatments   Labs (all labs ordered are listed, but only abnormal results are displayed) Labs Reviewed  LACTIC ACID, PLASMA - Abnormal; Notable for the following components:      Result Value   Lactic Acid, Venous 2.9 (*)    All other components within normal limits  COMPREHENSIVE METABOLIC PANEL - Abnormal; Notable for the following components:   Sodium 132 (*)    CO2 20 (*)    Glucose, Bld 137 (*)    Calcium 8.8 (*)    Albumin 3.4 (*)    Total Bilirubin 0.2 (*)    All other components within normal limits  CBC WITH DIFFERENTIAL/PLATELET - Abnormal; Notable for the following components:   WBC 17.0 (*)    Hemoglobin 9.6 (*)    HCT 33.6 (*)    MCV 72.3 (*)    MCH 20.6 (*)    MCHC 28.6 (*)    RDW 23.3 (*)    Neutro Abs 15.1 (*)    Abs Immature Granulocytes 0.09 (*)    All other components within normal limits  CULTURE, BLOOD (ROUTINE X 2)  CULTURE, BLOOD (ROUTINE X 2)  SARS CORONAVIRUS 2 BY RT PCR  LACTIC ACID, PLASMA  TROPONIN I (HIGH SENSITIVITY)  TROPONIN I (HIGH SENSITIVITY)    EKG EKG  Interpretation  Date/Time:  Sunday September 30 2021 15:19:07 EDT Ventricular Rate:  125 PR Interval:    QRS Duration: 64 QT Interval:  318 QTC Calculation: 458 R Axis:   66 Text Interpretation: Undetermined rhythm Low voltage QRS J point elevation Consider right ventricular involvement in acute inferior infarct Abnormal ECG When compared with ECG of 29-Aug-2021 09:14, Since last tracing rate faster Confirmed by Wandra Arthurs [phone]) on  09/30/2021 4:55:19 PM  Radiology DG Chest 2 View  Result Date: 09/30/2021 CLINICAL DATA:  Cough.  Chest pain. EXAM: CHEST - 2 VIEW COMPARISON:  08/29/2021.  CT, 04/30/2021. FINDINGS: Cardiac silhouette is normal in size. No mediastinal or hilar masses. No evidence of adenopathy. Airspace consolidation in the superior segment of the right lower lobe, projecting behind the hila on the lateral view. Remainder of the lungs is clear. No pleural effusion or pneumothorax. Skeletal structures are intact. IMPRESSION: 1. Airspace opacity in the superior segment of the right lower lobe consistent with pneumonia. Electronically Signed   By: Lajean Manes M.D.   On: 09/30/2021 15:51    Procedures Procedures    CRITICAL CARE Performed by: Wandra Arthurs   Total critical care time: 30 minutes  Critical care time was exclusive of separately billable procedures and treating other patients.  Critical care was necessary to treat or prevent imminent or life-threatening deterioration.  Critical care was time spent personally by me on the following activities: development of treatment plan with patient and/or surrogate as well as nursing, discussions with consultants, evaluation of patient's response to treatment, examination of patient, obtaining history from patient or surrogate, ordering and performing treatments and interventions, ordering and review of laboratory studies, ordering and review of radiographic studies, pulse oximetry and re-evaluation of patient's  condition.   Medications Ordered in ED Medications  sodium chloride 0.9 % bolus 2,000 mL (has no administration in time range)  vancomycin (VANCOCIN) IVPB 1000 mg/200 mL premix (has no administration in time range)  ceFEPIme (MAXIPIME) 2 g in sodium chloride 0.9 % 100 mL IVPB (has no administration in time range)  fentaNYL (SUBLIMAZE) injection 50 mcg (50 mcg Intravenous Given 09/30/21 1739)    ED Course/ Medical Decision Making/ A&P                           Medical Decision Making SMITH MCNICHOLAS is a 71 y.o. female here presenting with fever and cough.  Concern for possible pneumonia versus bronchitis.  Plan to get CBC and CMP and lactate and cultures and chest x-ray.  5:46 PM Patient's white blood cell count is 17 and lactate is elevated at 2.7.  Chest x-ray showed right lower lobe pneumonia.  Patient is tachycardic.  Patient meets SIRS criteria and the source is pneumonia.  Since patient recently was admitted to family practice for pneumonia, I ordered HCAP coverage and family practice we will readmit.    Problems Addressed: HCAP (healthcare-associated pneumonia): acute illness or injury Sepsis, due to unspecified organism, unspecified whether acute organ dysfunction present [facility]): acute illness or injury  Amount and/or Complexity of Data Reviewed Labs: ordered. Decision-making details documented in ED Course. Radiology: ordered and independent interpretation performed. Decision-making details documented in ED Course.  Risk Prescription drug management. Decision regarding hospitalization.    Final Clinical Impression(s) / ED Diagnoses Final diagnoses:  None    Rx / DC Orders ED Discharge Orders     None         Drenda Freeze, MD 09/30/21 1747

## 2021-09-30 NOTE — Assessment & Plan Note (Addendum)
Meets severe sepsis criteria with tachycardia, leukocytosis, source of infection, and lactic acidosis. Sepsis protocol. Has been afebrile. COVID negative. BCx preliminary NGTD. Lactate downtrended.  - narrow abx to ceftriaxone and azithromycin - Continue to monitor fever curve  - PT/OT - Continuous telemetry and pulse ox - Supplemental O2 to maintain sats >92%. Continue to wean O2.

## 2021-09-30 NOTE — Progress Notes (Signed)
Pharmacy Antibiotic Note  [name] is a 71 y.o. female admitted on 09/30/2021 presenting with CP and cough, concern for pna.  Pharmacy has been consulted for cefepime dosing.  Plan: Cefepime 2g IV every 8 hours Monitor renal function, clinical progression and ability to narrow     Temp (24hrs), Avg:99.7 F (37.6 C), Min:99.7 F (37.6 C), Max:99.7 F (37.6 C)  Recent Labs  Lab 09/30/21 1532  WBC 17.0*  CREATININE 0.97  LATICACIDVEN 2.9*    CrCl cannot be calculated (Unknown ideal weight.).    Allergies  Allergen Reactions   Bee Venom Anaphylaxis   Morphine And Related Other (See Comments)    Overly sedated   Neurontin [Gabapentin] Other (See Comments)    Nightmares    Codeine Hives   Adhesive [Tape] Itching and Other (See Comments)    Redness, Paper tape is ok   Sulfa Antibiotics Other (See Comments)    Childhood allergy Unknown reaction   Sulfonamide Derivatives Other (See Comments)    Childhood allergy Unknown reaction    Bertis Ruddy, PharmD Clinical Pharmacist ED Pharmacist Phone # [phone] 09/30/2021 5:27 PM

## 2021-09-30 NOTE — ED Notes (Signed)
ED TO INPATIENT HANDOFF REPORT  ED Nurse Name and Phone #: Mel Almond [phone]  S Name/Age/Gender Marval Regal Brereton 71 y.o. female Room/Bed: 041C/041C  Code Status   Code Status: Full Code  Home/SNF/Other Home Patient oriented to: self, place, time, and situation Is this baseline? Yes   Triage Complete: Triage complete  Chief Complaint Sepsis [facility]) [A41.9]  Triage Note Patient BIB [facility] from home for complaint of sharp chest pain that started today, also complains of a cough that makes the pain worse. 20g saline lock in left hand. Patient received 324mg  ASA and 1x SL NTG from EMS PTA, patient reports no improvement in pain after NTG.   Allergies Allergies  Allergen Reactions   Bee Venom Anaphylaxis   Morphine And Related Other (See Comments)    Overly sedated   Neurontin [Gabapentin] Other (See Comments)    Nightmares    Codeine Hives   Adhesive [Tape] Itching and Other (See Comments)    Redness, Paper tape is ok   Sulfa Antibiotics Other (See Comments)    Childhood allergy Unknown reaction   Sulfonamide Derivatives Other (See Comments)    Childhood allergy Unknown reaction    Level of Care/Admitting Diagnosis ED Disposition     ED Disposition  Admit   Condition  --   [place]: [place] [[id number]]  Level of Care: Telemetry Medical [104]  May place patient in observation at [facility] or [place] if equivalent level of care is available:: No  Covid Evaluation: Symptomatic Person Under Investigation (PUI) or recent exposure (last 10 days) *Testing Required*  Diagnosis: Sepsis [facility]) [[id number]]  Admitting Physician: Zenia Resides [[id number]]  Attending Physician: Zenia Resides [[id number]]          B Medical/Surgery History Past Medical History:  Diagnosis Date   Anxiety    Aortic atherosclerosis (HCC)    Bilateral cataracts    Carpal tunnel syndrome    Cervical cancer (HCC)    Chronic back pain    Claudication (HCC)     COPD (chronic obstructive pulmonary disease) (HCC)    wheezing   DDD (degenerative disc disease), cervical    DDD (degenerative disc disease), lumbar    Depression    Diverticulosis    Gastroparesis    GERD (gastroesophageal reflux disease)    Hearing loss    Bilateral   History of blood transfusion    History of colon polyps    History of migraine    Hypertension    Internal hemorrhoids    Iron deficiency anemia    Left-sided weakness    Leg swelling    Liver disease    pt unaware   Lung nodule    several small   OA (osteoarthritis)    both hands   Positive ANA (antinuclear antibody)    Pre-diabetes    Raynaud's phenomenon    Restless leg    Seizures (HCC)    no meds for 8 months   Sleep apnea    does not use her cpap   Spondylosis    Stroke ([place]) 2015   no weakness   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   rt   Urge incontinence of urine    Varicose vein of leg    Venous insufficiency    Past Surgical History:  Procedure Laterality Date   BACK SURGERY     BIOPSY  08/31/2021   Procedure: BIOPSY;  Surgeon: Thornton Park, MD;  Location: [place];  Service: Gastroenterology;;  BLEPHAROPLASTY  10/10/2017   CATARACT EXTRACTION, BILATERAL     CESAREAN SECTION     CHOLECYSTECTOMY     COLONOSCOPY  10/2016   COLONOSCOPY WITH PROPOFOL N/A 08/31/2021   Procedure: COLONOSCOPY WITH PROPOFOL;  Surgeon: Thornton Park, MD;  Location: [place];  Service: Gastroenterology;  Laterality: N/A;   ESOPHAGOGASTRODUODENOSCOPY (EGD) WITH PROPOFOL N/A 08/31/2021   Procedure: ESOPHAGOGASTRODUODENOSCOPY (EGD) WITH PROPOFOL;  Surgeon: Thornton Park, MD;  Location: [place];  Service: Gastroenterology;  Laterality: N/A;   HAND SURGERY Bilateral    carpal tunnel   HEMORRHOID SURGERY     INTERSTIM IMPLANT PLACEMENT     KNEE SURGERY Left    arthroscopy x3   LOWER EXTREMITY ANGIOGRAPHY N/A 07/16/2016   Procedure: Lower Extremity Angiography;  Surgeon: Adrian Prows, MD;   Location: [place] [facility];  Service: Cardiovascular;  Laterality: N/A;   LOWER EXTREMITY INTERVENTION N/A 08/06/2016   Procedure: Lower Extremity Intervention;  Surgeon: Adrian Prows, MD;  Location: [place] [facility];  Service: Cardiovascular;  Laterality: N/A;   NECK SURGERY     OTHER SURGICAL HISTORY  2013   bladder stimulator in back   PERIPHERAL VASCULAR BALLOON ANGIOPLASTY  08/06/2016   Procedure: Peripheral Vascular Balloon Angioplasty;  Surgeon: Adrian Prows, MD;  Location: [place] [facility];  Service: Cardiovascular;;  Right SFA   POLYPECTOMY  08/31/2021   Procedure: POLYPECTOMY;  Surgeon: Thornton Park, MD;  Location: [facility];  Service: Gastroenterology;;   TENNIS ELBOW RELEASE/NIRSCHEL PROCEDURE Right 12/2015   TOE SURGERY Right    right foot second and fourth toe   TOTAL HIP ARTHROPLASTY Left    TOTAL HIP REVISION Left 10/13/2017   Procedure: LEFT TOTAL HIP REVISION ACETABULUM, OPEN REDUCTION INTERNAL WITH BONE GRAFT FIXATION LEFT GREATER TROCHANTERIC FRACTURE;  Surgeon: Frederik Pear, MD;  Location: [facility];  Service: Orthopedics;  Laterality: Left;   TOTAL KNEE ARTHROPLASTY Right 01/12/2016   Procedure: RIGHT TOTAL KNEE ARTHROPLASTY;  Surgeon: Netta Cedars, MD;  Location: [place];  Service: Orthopedics;  Laterality: Right;   TUBAL LIGATION     UPPER GI ENDOSCOPY  10/2016   VAGINAL HYSTERECTOMY       A IV Location/Drains/Wounds Patient Lines/Drains/Airways Status     Active Line/Drains/Airways     Name Placement date Placement time Site Days   Peripheral IV 09/30/21 20 G Left Hand 09/30/21  1538  Hand  less than 1   Peripheral IV 09/30/21 20 G Right Antecubital 09/30/21  1741  Antecubital  less than 1   Incision (Closed) 10/13/17 Hip Left 10/13/17  0949  -- 1448            Intake/Output Last 24 hours  Intake/Output Summary (Last 24 hours) at 09/30/2021 2009 Last data filed at 09/30/2021 1836 Gross per 24 hour  Intake 96.24 ml  Output --  Net 96.24 ml     Labs/Imaging Results for orders placed or performed during the hospital encounter of 09/30/21 (from the past 48 hour(s))  Troponin I (High Sensitivity)     Status: None   Collection Time: 09/30/21  3:32 PM  Result Value Ref Range   Troponin I (High Sensitivity) 8 <18 ng/L    Comment: (NOTE) Elevated high sensitivity troponin I (hsTnI) values and significant  changes across serial measurements may suggest ACS but many other  chronic and acute conditions are known to elevate hsTnI results.  Refer to the "Links" section for chest pain algorithms and additional  guidance. Performed at [facility],  [address], [place], [place] [address]   Lactic acid, plasma     Status: Abnormal   Collection Time: 09/30/21  3:32 PM  Result Value Ref Range   Lactic Acid, Venous 2.9 (HH) 0.5 - 1.9 mmol/L    Comment: CRITICAL RESULT CALLED TO, READ BACK BY AND VERIFIED WITH J. BLUE RN 09/30/21 @1644  BY J. WHITE Performed at [facility] [address] [place], [place] [address]   Comprehensive metabolic panel     Status: Abnormal   Collection Time: 09/30/21  3:32 PM  Result Value Ref Range   Sodium 132 (L) 135 - 145 mmol/L   Potassium 3.9 3.5 - 5.1 mmol/L   Chloride 100 98 - 111 mmol/L   CO2 20 (L) 22 - 32 mmol/L   Glucose, Bld 137 (H) 70 - 99 mg/dL    Comment: Glucose reference range applies only to samples taken after fasting for at least 8 hours.   BUN 11 8 - 23 mg/dL   Creatinine, Ser 0.97 0.44 - 1.00 mg/dL   Calcium 8.8 (L) 8.9 - 10.3 mg/dL   Total Protein 7.4 6.5 - 8.1 g/dL   Albumin 3.4 (L) 3.5 - 5.0 g/dL   AST 19 15 - 41 U/L   ALT 13 0 - 44 U/L   Alkaline Phosphatase 110 38 - 126 U/L   Total Bilirubin 0.2 (L) 0.3 - 1.2 mg/dL   GFR, Estimated >60 >60 mL/min    Comment: (NOTE) Calculated using the CKD-EPI Creatinine Equation (2021)    Anion gap 12 5 - 15    Comment: Performed at [facility], [place] [address] [place], [place] [address]  CBC with Differential      Status: Abnormal   Collection Time: 09/30/21  3:32 PM  Result Value Ref Range   WBC 17.0 (H) 4.0 - 10.5 K/uL   RBC 4.65 3.87 - 5.11 MIL/uL   Hemoglobin 9.6 (L) 12.0 - 15.0 g/dL   HCT 33.6 (L) 36.0 - 46.0 %   MCV 72.3 (L) 80.0 - 100.0 fL   MCH 20.6 (L) 26.0 - 34.0 pg   MCHC 28.6 (L) 30.0 - 36.0 g/dL   RDW 23.3 (H) 11.5 - 15.5 %   Platelets 253 150 - 400 K/uL   nRBC 0.0 0.0 - 0.2 %   Neutrophils Relative % 89 %   Neutro Abs 15.1 (H) 1.7 - 7.7 K/uL   Lymphocytes Relative 5 %   Lymphs Abs 0.9 0.7 - 4.0 K/uL   Monocytes Relative 5 %   Monocytes Absolute 0.9 0.1 - 1.0 K/uL   Eosinophils Relative 0 %   Eosinophils Absolute 0.0 0.0 - 0.5 K/uL   Basophils Relative 0 %   Basophils Absolute 0.0 0.0 - 0.1 K/uL   Immature Granulocytes 1 %   Abs Immature Granulocytes 0.09 (H) 0.00 - 0.07 K/uL    Comment: Performed at [facility] [address] [place], [place] [address]  SARS Coronavirus 2 by RT PCR (hospital order, performed in [facility] hospital lab) *cepheid single result test* Anterior Nasal Swab     Status: None   Collection Time: 09/30/21  5:17 PM   Specimen: Anterior Nasal Swab  Result Value Ref Range   SARS Coronavirus 2 by RT PCR NEGATIVE NEGATIVE    Comment: (NOTE) SARS-CoV-2 target nucleic acids are NOT DETECTED.  The SARS-CoV-2 RNA is generally detectable in upper and lower respiratory specimens during the acute phase of infection. The lowest  concentration of SARS-CoV-2 viral copies this assay can detect is 250 copies / mL. A negative result does not preclude SARS-CoV-2 infection and should not be used as the sole basis for treatment or other patient management decisions.  A negative result may occur with improper specimen collection / handling, submission of specimen other than nasopharyngeal swab, presence of viral mutation(s) within the areas targeted by this assay, and inadequate number of viral copies (<250 copies / mL). A negative result must be combined  with clinical observations, patient history, and epidemiological information.  Fact Sheet for Patients:   https://www.patel.info/  Fact Sheet for Healthcare Providers: https://hall.com/  This test is not yet approved or  cleared by the [place] FDA and has been authorized for detection and/or diagnosis of SARS-CoV-2 by FDA under an Emergency Use Authorization (EUA).  This EUA will remain in effect (meaning this test can be used) for the duration of the COVID-19 declaration under Section 564(b)(1) of the Act, 21 U.S.C. section 360bbb-3(b)(1), unless the authorization is terminated or revoked sooner.  Performed at [facility], [place] [address] [place], [place] [address]   Troponin I (High Sensitivity)     Status: None   Collection Time: 09/30/21  5:41 PM  Result Value Ref Range   Troponin I (High Sensitivity) 9 <18 ng/L    Comment: (NOTE) Elevated high sensitivity troponin I (hsTnI) values and significant  changes across serial measurements may suggest ACS but many other  chronic and acute conditions are known to elevate hsTnI results.  Refer to the "Links" section for chest pain algorithms and additional  guidance. Performed at [facility], [place] [address] [place],  [address]    DG Chest 2 View  Result Date: 09/30/2021 CLINICAL DATA:  Cough.  Chest pain. EXAM: CHEST - 2 VIEW COMPARISON:  08/29/2021.  CT, 04/30/2021. FINDINGS: Cardiac silhouette is normal in size. No mediastinal or hilar masses. No evidence of adenopathy. Airspace consolidation in the superior segment of the right lower lobe, projecting behind the hila on the lateral view. Remainder of the lungs is clear. No pleural effusion or pneumothorax. Skeletal structures are intact. IMPRESSION: 1. Airspace opacity in the superior segment of the right lower lobe consistent with pneumonia. Electronically Signed   By: Lajean Manes M.D.   On: 09/30/2021 15:51     Pending Labs Unresulted Labs (From admission, onward)     Start     Ordered   10/01/21 2751  Basic metabolic panel  Tomorrow morning,   R        09/30/21 1752   10/01/21 0500  CBC with Differential/Platelet  Tomorrow morning,   R        09/30/21 1752   09/30/21 1759  HIV Antibody (routine testing w rflx)  Once,   R        09/30/21 1759   09/30/21 1659  Blood culture (routine x 2)  BLOOD CULTURE X 2,   R      09/30/21 1658   09/30/21 1526  Lactic acid, plasma  Now then every 2 hours,   R      09/30/21 1526            Vitals/Pain Today's Vitals   09/30/21 1830 09/30/21 1900 09/30/21 1936 09/30/21 1937  BP:      Pulse: (!) 118 (!) 119    Resp: (!) 27 (!) 33    Temp:    98.6 F (37 C)  TempSrc:  Oral  SpO2: 100% 100%    PainSc:   7      Isolation Precautions No active isolations  Medications Medications  ceFEPIme (MAXIPIME) 2 g in sodium chloride 0.9 % 100 mL IVPB (0 g Intravenous Stopped 09/30/21 1836)  rosuvastatin (CRESTOR) tablet 10 mg (has no administration in time range)  pantoprazole (PROTONIX) EC tablet 40 mg (has no administration in time range)  enoxaparin (LOVENOX) injection 40 mg (has no administration in time range)  ipratropium-albuterol (DUONEB) 0.5-2.5 (3) MG/3ML nebulizer solution 3 mL (has no administration in time range)  acetaminophen (TYLENOL) tablet 650 mg (has no administration in time range)  busPIRone (BUSPAR) tablet 5 mg (has no administration in time range)  DULoxetine (CYMBALTA) DR capsule 60 mg (has no administration in time range)  hydrocortisone (ANUSOL-HC) suppository 25 mg (has no administration in time range)  nicotine (NICODERM CQ - dosed in mg/24 hours) patch 21 mg (has no administration in time range)  ibuprofen (ADVIL) tablet 600 mg (600 mg Oral Patient Refused/Not Given 09/30/21 1837)  sodium chloride 0.9 % bolus 2,000 mL (2,000 mLs Intravenous New Bag/Given 09/30/21 1742)  fentaNYL (SUBLIMAZE) injection 50 mcg (50 mcg  Intravenous Given 09/30/21 1739)    Mobility walks Low fall risk   Focused Assessments Pulmonary Assessment Handoff:  Lung sounds:   O2 Device: Nasal Cannula O2 Flow Rate (L/min): 2 L/min    R Recommendations: See Admitting Provider Note  Report given to:   Additional Notes: Able to use bedside commode

## 2021-09-30 NOTE — ED Notes (Signed)
Per patient placement, patient is still on the list to get a bed and there is nothing more that the ED RN can do on their end to help the progression of getting a bed on a unit.

## 2021-09-30 NOTE — H&P (Cosign Needed Addendum)
Hospital Admission History and Physical Service Pager: [phone]  Patient name: [name] Medical record number: [id number] Date of Birth: [birth year] Age: 71 y.o. Gender: female  Primary Care Provider: Alvester Chou, NP Consultants: none Code Status: FULL  Preferred Emergency Contact: Dierdre Searles [phone]  Chief Complaint: SOB  Assessment and Plan: [name] is a 71 y.o. female presenting with SOB and chest pain. Differential for presentation of this includes community acquired PNA, COVID, COPD exacerbation.   PMHx: history of chronic IDA, CVA (2017), COPD, tobacco use, PAD s/p R SFA angioplasty, colon polyps (2018), gastroparesis, and diverticular disease   * Severe sepsis ([place]) Meets severe sepsis criteria with tachycardia, leukocytosis, source of infection, and lactic acidosis. Sepsis protocol. Has been afebrile.  - Admit to med-tele, attending Dr. Andria Frames - Vitals per floor - 2L bolus IVF  - Start cefepime for CAP abx coverage, plan to narrow abx tomorrow  - F/u COVID test  - F/u BCx - Trend lactate  - AM BMP, CBC - Continue to monitor fever curve  - PT/OT - Continuous telemetry and pulse ox - Supplemental O2 to maintain sats >92%  Shortness of breath Likely CAP given patient was living at home prior to coming to the ED. Given hx of COPD and ongoing tobacco use, could also be COPD exacerbation. Her COPD has been noted in chart reviews but she is only on PRN albuterol, unclear if officially diagnosed. Lobar PNA on CXR. Consider starting steroids for COPD exacerbation but will await result of COVID test.  - Cefpime - Duonebs q4h PRN  - Supplemental O2 to maintain sats > 92% - If worsening respiratory status, consider steroids   Chest pain Chest pain is in the setting of ongoing coughing and likely related to coughing exacerbation. Troponin unremarkable. EKG with sinus tachycardia in the room.  -tylenol and ibuprofen PRN -one time EKG   Chronic  problems:  HTN - no home antihypertensive use  RLS - continue ropinirole HLD - continue rosuvastatin Prediabetes  Tobacco use - nicotine patch 21 daily GERD - protonix PRN Anxiety/depression: continue buspar, cymbalta  Hemorrhoids/constipation - holding linzess, continue PRN hydrocortisone suppository  Anemia - stable on CBC  FEN/GI: heart healthy diet  VTE Prophylaxis: lovenox  Disposition: pending clinical improvement  History of Present Illness:  [name] is a 71 y.o. female presenting with chest pain and SOB.   She reports she felt weak and couldn't breathe starting this morning. She took her inhaler this morning, but it did not help her. She tried walking around the house but felt too weak and short of breath to do anything else. Has not had any fever. Slight increase in yellow sputum. Denies hemoptysis. She denies any sick contacts. She is living at home with her husband and 84 year old granddaughter.   Has been having ongoing chest pain on her right side when she is coughing. Denies abdominal pain, vomiting. Has had diarrhea for the last week off and on.  In the ED, came in with pain and productive cough. T 99, HR 124. WBC 17. Hgb 9.6. Lactate elevated. CXR PNA. Troponin 8.   Review Of Systems: Per HPI with the following additions: none  Pertinent Past Medical History: COPD Pre-diabetes Internal hemorrhoids Cervical cancer s/p "frozen" HTN GERD HLD Seizures (not currently on medications)  Remainder reviewed in history tab.   Pertinent Past Surgical History: Colonoscopy 2 years ago per patient  Cholecystectomy Cesarean section Hip arthroplasty x2 Knee arthroplasty  Back surgery PAD s/p R SFA angioplasty,   Remainder reviewed in history tab.   Pertinent Social History: Tobacco use: Yes 1.5 ppd smoker Alcohol use: denies Other Substance use: denies Lives with granddaughter  Pertinent Family History: None relevant  Remainder reviewed in history  tab.   Important Outpatient Medications: Ropinirole Oxycodone prn Trazodone prn Crestor Duloxetine Albuterol inhaler prn Vitamin D Linzess   Remainder reviewed in medication history.   Objective: BP (!) 153/70   Pulse (!) 119   Temp 99.7 F (37.6 C) (Oral)   Resp (!) 31   SpO2 94%  Exam: General: alert obese Caucasian female in no acute distress  Eyes: PERRL Neck: Full, no lymphadenopathy Cardiovascular: tachycardic. No murmurs/rubs/gallops. Respiratory: Wheezing noted bilaterally. Normal WOB on RA.  Gastrointestinal: Soft, non-tender, non-distended Neuro: alert and oriented to self, place, time.  Psych: Anxious affect. Euthymic.   Labs:  CBC BMET  Recent Labs  Lab 09/30/21 1532  WBC 17.0*  HGB 9.6*  HCT 33.6*  PLT 253   Recent Labs  Lab 09/30/21 1532  NA 132*  K 3.9  CL 100  CO2 20*  BUN 11  CREATININE 0.97  GLUCOSE 137*  CALCIUM 8.8*     Pertinent additional labs troponin 8. Lactic acid 2.9.   EKG: My own interpretation (not copied from electronic read): sinus tachycardia    Imaging Studies Performed: Imaging Study (ie. Chest x-ray) Impression from Radiologist: Airspace opacity in the superior segment of the right lower lobe consistent with pneumonia.    Rolanda Lundborg, MD 09/30/2021, 6:31 PM PGY-1, [facility] Intern pager: [phone], text pages welcome Secure chat group [facility] Upper-Level Resident Addendum   I have independently interviewed and examined the patient. I have discussed the above with the original author and agree with their documentation. My edits for correction/addition/clarification are in within the document. Please see also any attending notes.   Rise Patience, DO  PGY-3, [facility] 09/30/2021 6:34 PM  [place] Service pager: [phone] (text pages welcome through [facility])

## 2021-09-30 NOTE — ED Notes (Signed)
Yao MD notified of critical lactic of 2.9 reported by lab

## 2021-09-30 NOTE — Assessment & Plan Note (Addendum)
Chest pain is in the setting of ongoing coughing and likely related to coughing exacerbation. Troponin unremarkable. EKG with sinus tachycardia in the room.  -tylenol and ibuprofen PRN -one time EKG

## 2021-09-30 NOTE — ED Notes (Signed)
Lactic 2.9 critical reported by lab

## 2021-09-30 NOTE — Assessment & Plan Note (Addendum)
Likely CAP given patient was living at home prior to coming to the ED. Given hx of COPD and ongoing tobacco use, could also be COPD exacerbation. Her COPD has been noted in chart reviews but she is only on PRN albuterol, unclear if officially diagnosed. Lobar PNA on CXR. Consider starting steroids for COPD exacerbation but will await result of COVID test.  - Cefpime - Duonebs q4h PRN  - Supplemental O2 to maintain sats > 92% - If worsening respiratory status, consider steroids

## 2021-09-30 NOTE — ED Triage Notes (Addendum)
Patient BIB [facility] from home for complaint of sharp chest pain that started today, also complains of a cough that makes the pain worse. 20g saline lock in left hand. Patient received 324mg  ASA and 1x SL NTG from EMS PTA, patient reports no improvement in pain after NTG.

## 2021-10-01 DIAGNOSIS — Z20822 Contact with and (suspected) exposure to covid-19: Secondary | ICD-10-CM | POA: Diagnosis present

## 2021-10-01 DIAGNOSIS — A419 Sepsis, unspecified organism: Secondary | ICD-10-CM | POA: Diagnosis present

## 2021-10-01 DIAGNOSIS — Z79899 Other long term (current) drug therapy: Secondary | ICD-10-CM | POA: Diagnosis not present

## 2021-10-01 DIAGNOSIS — J9601 Acute respiratory failure with hypoxia: Secondary | ICD-10-CM | POA: Diagnosis present

## 2021-10-01 DIAGNOSIS — R6520 Severe sepsis without septic shock: Secondary | ICD-10-CM | POA: Diagnosis present

## 2021-10-01 DIAGNOSIS — F17210 Nicotine dependence, cigarettes, uncomplicated: Secondary | ICD-10-CM | POA: Diagnosis present

## 2021-10-01 DIAGNOSIS — Z91030 Bee allergy status: Secondary | ICD-10-CM | POA: Diagnosis not present

## 2021-10-01 DIAGNOSIS — E872 Acidosis, unspecified: Secondary | ICD-10-CM | POA: Diagnosis present

## 2021-10-01 DIAGNOSIS — D509 Iron deficiency anemia, unspecified: Secondary | ICD-10-CM

## 2021-10-01 DIAGNOSIS — J441 Chronic obstructive pulmonary disease with (acute) exacerbation: Secondary | ICD-10-CM | POA: Diagnosis present

## 2021-10-01 DIAGNOSIS — E669 Obesity, unspecified: Secondary | ICD-10-CM | POA: Diagnosis present

## 2021-10-01 DIAGNOSIS — Z888 Allergy status to other drugs, medicaments and biological substances status: Secondary | ICD-10-CM | POA: Diagnosis not present

## 2021-10-01 DIAGNOSIS — J440 Chronic obstructive pulmonary disease with acute lower respiratory infection: Secondary | ICD-10-CM | POA: Diagnosis present

## 2021-10-01 DIAGNOSIS — I10 Essential (primary) hypertension: Secondary | ICD-10-CM | POA: Diagnosis present

## 2021-10-01 DIAGNOSIS — Z9862 Peripheral vascular angioplasty status: Secondary | ICD-10-CM | POA: Diagnosis not present

## 2021-10-01 DIAGNOSIS — Z7982 Long term (current) use of aspirin: Secondary | ICD-10-CM | POA: Diagnosis not present

## 2021-10-01 DIAGNOSIS — Z8673 Personal history of transient ischemic attack (TIA), and cerebral infarction without residual deficits: Secondary | ICD-10-CM | POA: Diagnosis not present

## 2021-10-01 DIAGNOSIS — J181 Lobar pneumonia, unspecified organism: Secondary | ICD-10-CM | POA: Diagnosis present

## 2021-10-01 DIAGNOSIS — J189 Pneumonia, unspecified organism: Secondary | ICD-10-CM

## 2021-10-01 DIAGNOSIS — Z885 Allergy status to narcotic agent status: Secondary | ICD-10-CM | POA: Diagnosis not present

## 2021-10-01 DIAGNOSIS — R7303 Prediabetes: Secondary | ICD-10-CM | POA: Diagnosis present

## 2021-10-01 DIAGNOSIS — G2581 Restless legs syndrome: Secondary | ICD-10-CM | POA: Diagnosis present

## 2021-10-01 DIAGNOSIS — E785 Hyperlipidemia, unspecified: Secondary | ICD-10-CM | POA: Diagnosis present

## 2021-10-01 DIAGNOSIS — Z882 Allergy status to sulfonamides status: Secondary | ICD-10-CM | POA: Diagnosis not present

## 2021-10-01 DIAGNOSIS — F32A Depression, unspecified: Secondary | ICD-10-CM | POA: Diagnosis present

## 2021-10-01 LAB — CBC WITH DIFFERENTIAL/PLATELET
Abs Immature Granulocytes: 0.06 10*3/uL (ref 0.00–0.07)
Basophils Absolute: 0 10*3/uL (ref 0.0–0.1)
Basophils Relative: 0 %
Eosinophils Absolute: 0.1 10*3/uL (ref 0.0–0.5)
Eosinophils Relative: 0 %
HCT: 27.4 % — ABNORMAL LOW (ref 36.0–46.0)
Hemoglobin: 7.9 g/dL — ABNORMAL LOW (ref 12.0–15.0)
Immature Granulocytes: 0 %
Lymphocytes Relative: 10 %
Lymphs Abs: 1.4 10*3/uL (ref 0.7–4.0)
MCH: 20.5 pg — ABNORMAL LOW (ref 26.0–34.0)
MCHC: 28.8 g/dL — ABNORMAL LOW (ref 30.0–36.0)
MCV: 71.2 fL — ABNORMAL LOW (ref 80.0–100.0)
Monocytes Absolute: 1.1 10*3/uL — ABNORMAL HIGH (ref 0.1–1.0)
Monocytes Relative: 8 %
Neutro Abs: 11 10*3/uL — ABNORMAL HIGH (ref 1.7–7.7)
Neutrophils Relative %: 82 %
Platelets: 190 10*3/uL (ref 150–400)
RBC: 3.85 MIL/uL — ABNORMAL LOW (ref 3.87–5.11)
RDW: 22.7 % — ABNORMAL HIGH (ref 11.5–15.5)
WBC: 13.7 10*3/uL — ABNORMAL HIGH (ref 4.0–10.5)
nRBC: 0 % (ref 0.0–0.2)

## 2021-10-01 LAB — BASIC METABOLIC PANEL
Anion gap: 7 (ref 5–15)
BUN: 10 mg/dL (ref 8–23)
CO2: 22 mmol/L (ref 22–32)
Calcium: 8.4 mg/dL — ABNORMAL LOW (ref 8.9–10.3)
Chloride: 106 mmol/L (ref 98–111)
Creatinine, Ser: 0.8 mg/dL (ref 0.44–1.00)
GFR, Estimated: >60 mL/min (ref >60)
Glucose, Bld: 118 mg/dL — ABNORMAL HIGH (ref 70–99)
Potassium: 3.5 mmol/L (ref 3.5–5.1)
Sodium: 135 mmol/L (ref 135–145)

## 2021-10-01 LAB — HIV ANTIBODY (ROUTINE TESTING W REFLEX): HIV Screen 4th Generation wRfx: NONREACTIVE

## 2021-10-01 MED ORDER — PREDNISONE 20 MG PO TABS
40.0000 mg | ORAL_TABLET | Freq: Every day | ORAL | Status: DC
  Administered 2021-10-01 – 2021-10-03 (×3): 40 mg via ORAL
  Filled 2021-10-01 (×3): qty 2

## 2021-10-01 MED ORDER — SODIUM CHLORIDE 0.9 % IV SOLN
2.0000 g | INTRAVENOUS | Status: DC
  Administered 2021-10-01: 2 g via INTRAVENOUS
  Filled 2021-10-01 (×2): qty 20

## 2021-10-01 MED ORDER — ALBUTEROL SULFATE (2.5 MG/3ML) 0.083% IN NEBU: 3.0000 mL | INHALATION_SOLUTION | Freq: Two times a day (BID) | RESPIRATORY_TRACT | Status: DC | PRN

## 2021-10-01 MED ORDER — SODIUM CHLORIDE 0.9 % IV SOLN: 2.0000 g | INTRAVENOUS | Status: DC

## 2021-10-01 MED ORDER — OXYCODONE HCL 5 MG PO TABS
10.0000 mg | ORAL_TABLET | Freq: Two times a day (BID) | ORAL | Status: DC | PRN
  Administered 2021-10-01: 10 mg via ORAL
  Filled 2021-10-01: qty 2

## 2021-10-01 MED ORDER — ROPINIROLE HCL 0.5 MG PO TABS
1.0000 mg | ORAL_TABLET | Freq: Two times a day (BID) | ORAL | Status: DC
  Administered 2021-10-01 – 2021-10-03 (×4): 1 mg via ORAL
  Filled 2021-10-01 (×4): qty 2

## 2021-10-01 MED ORDER — TRAZODONE HCL 50 MG PO TABS
25.0000 mg | ORAL_TABLET | Freq: Every evening | ORAL | Status: DC | PRN
  Administered 2021-10-01 – 2021-10-02 (×2): 25 mg via ORAL
  Filled 2021-10-01 (×2): qty 1

## 2021-10-01 MED ORDER — AZITHROMYCIN 250 MG PO TABS
250.0000 mg | ORAL_TABLET | Freq: Every day | ORAL | Status: DC
  Administered 2021-10-01 – 2021-10-03 (×3): 250 mg via ORAL
  Filled 2021-10-01 (×3): qty 1

## 2021-10-01 MED ORDER — HYDROCERIN EX CREA
TOPICAL_CREAM | Freq: Every day | CUTANEOUS | Status: DC
  Filled 2021-10-01 (×2): qty 113

## 2021-10-01 NOTE — ED Notes (Signed)
Pt walked independently to bathroom

## 2021-10-01 NOTE — Plan of Care (Signed)
  Problem: Coping: Goal: Level of anxiety will decrease Outcome: Progressing   Problem: Elimination: Goal: Will not experience complications related to bowel motility Outcome: Progressing Goal: Will not experience complications related to urinary retention Outcome: Progressing   Problem: Pain Managment: Goal: General experience of comfort will improve Outcome: Progressing

## 2021-10-01 NOTE — Evaluation (Signed)
Occupational Therapy Evaluation Patient Details Name: [name] MRN: [id number] DOB: 1950-12-10 Today's Date: 10/01/2021   History of Present Illness Pt is a 71 y/o female presenting with SOB and chest pain. Admitted under sepsis protocol with further workup to rule out CAP vs COPD exacerbation. PMH: COPD, pre diabetes, cervical CA, HTn, GERD, HLD, seizures   Clinical Impression   PTA, pt lives with family, typically Modified Independent with ADLs (aside from sock mgmt) and mobility using cane. Pt endorses frequent falls at home primarily due to "wooziness" in standing. Pt presents now with deficits in cognition, standing balance and endurance. Pt requires Setup for UB ADL and Min-Mod A for LB ADLs. Overall, pt initially able to mobilize fairly well to bathroom with handheld assist. However, with progressive mobility, pt began to require increased assistance to maintain balance and reported orthostatic symptoms requiring additional staff support for safe transfer back to room.  Pending functional progress during admission, recommend HHOT follow up with pt's spouse available to provide light assistance as needed.  SpO2 >95% on RA Initial BP after orthostatic onset w/ seated rest break: 137/70 (86) After standing: 113/94 (102) End of session standing at stretcher side: 97/80 (87) HR 90s-100s      Recommendations for follow up therapy are one component of a multi-disciplinary discharge planning process, led by the attending physician.  Recommendations may be updated based on patient status, additional functional criteria and insurance authorization.   Follow Up Recommendations  Home health OT (pending progress)    Assistance Recommended at Discharge Frequent or constant Supervision/Assistance  Patient can return home with the following A lot of help with walking and/or transfers;A little help with bathing/dressing/bathroom;Assistance with cooking/housework;Direct supervision/assist for  medications management;Help with stairs or ramp for entrance    Functional Status Assessment  Patient has had a recent decline in their functional status and demonstrates the ability to make significant improvements in function in a reasonable and predictable amount of time.  Equipment Recommendations  None recommended by OT    Recommendations for Other Services       Precautions / Restrictions Precautions Precautions: Fall;Other (comment) Precaution Comments: monitor orthostatics Restrictions Weight Bearing Restrictions: No      Mobility Bed Mobility               General bed mobility comments: sitting EOB with nursing    Transfers Overall transfer level: Needs assistance Equipment used: 1 person hand held assist Transfers: Sit to/from Stand Sit to Stand: Min assist, Min guard           General transfer comment: initially min guard but with progressive "wooziness", required Min A for steadying assist with initial posterior leans.      Balance Overall balance assessment: Needs assistance, History of Falls Sitting-balance support: No upper extremity supported, Feet supported Sitting balance-Leahy Scale: Fair     Standing balance support: Bilateral upper extremity supported, During functional activity Standing balance-Leahy Scale: Poor                             ADL either performed or assessed with clinical judgement   ADL Overall ADL's : Needs assistance/impaired Eating/Feeding: Independent   Grooming: Min guard;Standing   Upper Body Bathing: Set up;Sitting   Lower Body Bathing: Minimal assistance;Sit to/from stand   Upper Body Dressing : Set up;Sitting   Lower Body Dressing: Moderate assistance;Sit to/from stand Lower Body Dressing Details (indicate cue type and reason): assist for  socks baseline, likely needs assist for balance in standing for donning over waist Toilet Transfer: Minimal assistance;Ambulation;Regular Contractor Details (indicate cue type and reason): Min A with steadying assist needed to correct balance walking to bathroom with HHA. multiple LOB Toileting- Clothing Manipulation and Hygiene: Min guard;Sitting/lateral lean;Sit to/from stand       Functional mobility during ADLs: Minimal assistance General ADL Comments: Pt with questionable cognitive deficits, impaired balance and symptomatic orthostasis     Vision Baseline Vision/History: 1 Wears glasses Ability to See in Adequate Light: 0 Adequate Patient Visual Report: No change from baseline Vision Assessment?: No apparent visual deficits     Perception     Praxis      Pertinent Vitals/Pain Pain Assessment Pain Assessment: No/denies pain     Hand Dominance Right   Extremity/Trunk Assessment Upper Extremity Assessment Upper Extremity Assessment: Generalized weakness   Lower Extremity Assessment Lower Extremity Assessment: Defer to PT evaluation   Cervical / Trunk Assessment Cervical / Trunk Assessment: Normal   Communication Communication Communication: No difficulties   Cognition Arousal/Alertness: Awake/alert Behavior During Therapy: WFL for tasks assessed/performed, Impulsive Overall Cognitive Status: No family/caregiver present to determine baseline cognitive functioning                                 General Comments: able to answer orientation questions and follow directions fairly consistently. attention deficits and some impulsivity noted as pt continued to attempt mobility despite balance deficits and reported "wooziness", tangential at times     General Comments  Pt recevied on 2 L O2, SpO2 96% with 2 L O2 and on RA with mobility. Upon exiting bathroom, pt with posterior LOB requriing Mod A to correct with staff bringing chair for pt to sit - continued dizziness and balance deficits with standing trials and to get back on stretcher    Exercises     Shoulder Instructions      Home  Living Family/patient expects to be discharged to:: Private residence Living Arrangements: Spouse/significant other;Other relatives (29 y/o granddaughter) Available Help at Discharge: Family;Available 24 hours/day Type of Home: House Home Access: Stairs to enter [employer] of Steps: 2 Entrance Stairs-Rails: None Home Layout: One level     Bathroom Shower/Tub: Occupational psychologist: Standard     Home Equipment: Conservation officer, nature (2 wheels);Cane - single point;Shower seat;Grab bars - tub/shower;BSC/3in1          Prior Functioning/Environment Prior Level of Function : Needs assist;Driving             Mobility Comments: uses cane for mobility, reports recent falls d/t "wooziness". working with HHPT ADLs Comments: reports typically Modified Independent with ADLs (though has assist for socks at home), able to complete basic IADls        OT Problem List: Decreased strength;Decreased activity tolerance;Impaired balance (sitting and/or standing);Decreased cognition;Decreased safety awareness;Decreased knowledge of use of DME or AE      OT Treatment/Interventions: Self-care/ADL training;Therapeutic exercise;Energy conservation;DME and/or AE instruction;Therapeutic activities;Patient/family education;Balance training    OT Goals(Current goals can be found in the care plan section) Acute Rehab OT Goals Patient Stated Goal: get some ice, decrease falls OT Goal Formulation: With patient Time For Goal Achievement: 10/22/21 Potential to Achieve Goals: Good  OT Frequency: Min 2X/week    Co-evaluation              AM-PAC OT "6 Clicks" Daily Activity  Outcome Measure Help from another person eating meals?: None Help from another person taking care of personal grooming?: A Little Help from another person toileting, which includes using toliet, bedpan, or urinal?: A Little Help from another person bathing (including washing, rinsing, drying)?: A  Little Help from another person to put on and taking off regular upper body clothing?: A Little Help from another person to put on and taking off regular lower body clothing?: A Lot 6 Click Score: 18   End of Session Equipment Utilized During Treatment: Gait belt Nurse Communication: Mobility status;Other (comment) (orthostatics)  Activity Tolerance: Patient tolerated treatment well Patient left: in bed;with call bell/phone within reach  OT Visit Diagnosis: Other abnormalities of gait and mobility (R26.89);Unsteadiness on feet (R26.81)                Time: 1660-6004 OT Time Calculation (min): 23 min Charges:  OT General Charges $OT Visit: 1 Visit OT Evaluation $OT Eval Moderate Complexity: 1 Mod  Malachy Chamber, OTR/L Acute Rehab Services Office: [phone]   Layla Maw 10/01/2021, 10:11 AM

## 2021-10-01 NOTE — Progress Notes (Signed)
Daily Progress Note Intern Pager: [phone]  Patient name: [name] Medical record number: [id number] Date of birth: 05-12-1950 Age: 71 y.o. Gender: female  Primary Care Provider: Alvester Chou, NP Consultants: none Code Status: Full  Pt Overview and Major Events to Date:  9/17: Admit to FMTS   Assessment and Plan: ARISTEA POSADA is a 71 y.o. female presenting with SOB and chest pain with community acquired PNA vs. COPD exacerbation.    PMHx: history of chronic IDA, CVA (2017), COPD, tobacco use, PAD s/p R SFA angioplasty, colon polyps (2018), gastroparesis, and diverticular disease   * Severe sepsis ([place]) Meets severe sepsis criteria with tachycardia, leukocytosis, source of infection, and lactic acidosis. Sepsis protocol. Has been afebrile. COVID negative. BCx preliminary NGTD. Lactate downtrended.  - narrow abx to ceftriaxone and azithromycin - Continue to monitor fever curve  - PT/OT - Continuous telemetry and pulse ox - Supplemental O2 to maintain sats >92%. Continue to wean O2.   Community acquired pneumonia Likely CAP given patient was living at home prior to coming to the ED. COPD, previously seen by pulmonology but was only on PRN albulterol. Lobar PNA on CXR. BCx preliminary NGTD. Will start steroids given continued wheezing and O2 requirement overnight.   - Narrowed cefepime to rocephin and azithromycin - Duonebs q4h PRN  - Supplemental O2 to maintain sats > 92% - started steroids    Chest pain Chest pain is in the setting of ongoing coughing and likely related to coughing exacerbation. Troponin unremarkable. EKG with sinus tachycardia in the room.  -tylenol and ibuprofen PRN -one time EKG  Iron deficiency anemia History of iron deficiency anemia diagnosed in 2018 related to chronic blood loss and poor iron absorption. No iron during hospitalization while she has an active infection. -outpatient iron studies and follow up with  heme/onc.    FEN/GI: heart healthy diet  PPx: lovenox  Dispo: home pending clinical improvement  Subjective:  Patient reports breathing and chest pain have improved. Discussed weaning off O2. She reports only recent diagnosis of COPD and was only on albuterol. She also reports abdominal pain, has not had a bowel movement since Friday but had ongoing diarrhea previously. Discussed starting a bowel regimen. She would like to wait until she goes to a room. She is also reporting itchiness and dry skin on her L ankle and R arm.   Objective: Temp:  [97.7 F (36.5 C)-99.7 F (37.6 C)] 97.7 F (36.5 C) (09/18 0802) Pulse Rate:  [96-124] 96 (09/18 0700) Resp:  [12-33] 20 (09/18 0700) BP: (115-153)/(46-90) 118/90 (09/18 0700) SpO2:  [90 %-100 %] 100 % (09/18 0700)  Physical Exam: General: alert, obese well-appearing woman in no acute distress on O2 Cardiovascular: RRR. No m/r/g. Respiratory: Wheezing noted bilaterally. On 3L O2 and weaned to 2L. Abdomen: Soft, minimally tender to palpation.  Extremities: No peripheral edema   Laboratory: Most recent CBC Lab Results  Component Value Date   WBC 13.7 (H) 10/01/2021   HGB 7.9 (L) 10/01/2021   HCT 27.4 (L) 10/01/2021   MCV 71.2 (L) 10/01/2021   PLT 190 10/01/2021   Most recent BMP    Latest Ref Rng & Units 10/01/2021    4:17 AM  BMP  Glucose 70 - 99 mg/dL 118   BUN 8 - 23 mg/dL 10   Creatinine 0.44 - 1.00 mg/dL 0.80   Sodium 135 - 145 mmol/L 135   Potassium 3.5 - 5.1 mmol/L 3.5   Chloride 98 -  111 mmol/L 106   CO2 22 - 32 mmol/L 22   Calcium 8.9 - 10.3 mg/dL 8.4   COVID negative  Bcx negative preliminary  Rolanda Lundborg, MD 10/01/2021, 8:56 AM  PGY-1, [place] Intern pager: [phone], text pages welcome Secure chat group [place]

## 2021-10-01 NOTE — ED Notes (Signed)
Assisted to bedside commode at this time call bell within reach

## 2021-10-01 NOTE — Assessment & Plan Note (Addendum)
History of iron deficiency anemia diagnosed in 2018 related to chronic blood loss and poor iron absorption. No iron during hospitalization while she has an active infection. -outpatient iron studies and follow up with heme/onc.

## 2021-10-01 NOTE — Evaluation (Signed)
Physical Therapy Evaluation Patient Details Name: [name] MRN: [id number] DOB: 09/18/1950 Today's Date: 10/01/2021  History of Present Illness  Pt is a 71 y/o female presenting with SOB and chest pain. Admitted under sepsis protocol with further workup to rule out CAP vs COPD exacerbation. PMH: COPD, pre diabetes, cervical CA, HTn, GERD, HLD, seizures  Clinical Impression  Pt admitted with above diagnosis. At baseline, pt ambulatory with cane.  She does report episodes at home (for months) where she falls - describes as getting weak and feels like someone pushes her.  Noted pt was orthostatic earlier with OT, but during PT BP was stable in all positions multiple times.  She ambulated independently to restroom earlier with nursing.  During PT evaluation, pt had episode in standing where she became unresponsive, remained standing, but leaned backward - eyes remained open and all VSS (pt on telemetry). Lasted 5-10 seconds and pt had no recall of event.  Pt was then able to walk with min guard to min A and chair/stretcher nearby for safety.  Notified ED RN, floor RN, and MD of episode. Floor RN reports similar episode when pt moved to room. Pt describes as "blanking out." Pt's strength overall WNL and is expected to progress well with therapy. At evaluation , pt mainly limited due to unresponsive episode. Pt currently with functional limitations due to the deficits listed below (see PT Problem List). Pt will benefit from skilled PT to increase their independence and safety with mobility to allow discharge to the venue listed below.          Recommendations for follow up therapy are one component of a multi-disciplinary discharge planning process, led by the attending physician.  Recommendations may be updated based on patient status, additional functional criteria and insurance authorization.  Follow Up Recommendations Home health PT      Assistance Recommended at Discharge Frequent or  constant Supervision/Assistance  Patient can return home with the following  A little help with walking and/or transfers;A little help with bathing/dressing/bathroom;Assistance with cooking/housework;Help with stairs or ramp for entrance    Equipment Recommendations None recommended by PT  Recommendations for Other Services       Functional Status Assessment Patient has had a recent decline in their functional status and demonstrates the ability to make significant improvements in function in a reasonable and predictable amount of time.     Precautions / Restrictions Precautions Precautions: Fall;Other (comment) Precaution Comments: episode of "blanking out"      Mobility  Bed Mobility Overal bed mobility: Needs Assistance Bed Mobility: Supine to Sit, Sit to Supine     Supine to sit: Min guard Sit to supine: Min guard   General bed mobility comments: min g for safety    Transfers Overall transfer level: Needs assistance Equipment used: 1 person hand held assist Transfers: Sit to/from Stand Sit to Stand: Min guard           General transfer comment: Performed multiple times during session (see general comments)    Ambulation/Gait Ambulation/Gait assistance: Min assist Gait Distance (Feet): 60 Feet Assistive device: 1 person hand held assist Gait Pattern/deviations: Decreased stride length Gait velocity: decreased     General Gait Details: Pt requiring min guard to min A for some unsteadiness without AD (uses cane at home).  Stayed close to stretcher in hallway in case pt had weakness episode (see comments)  Science writer  Modified Rankin (Stroke Patients Only)       Balance Overall balance assessment: Needs assistance, History of Falls Sitting-balance support: No upper extremity supported, Feet supported Sitting balance-Leahy Scale: Good     Standing balance support: No upper extremity supported, Single extremity  supported Standing balance-Leahy Scale: Poor Standing balance comment: Pt could stand without support but occasionally would have LOB posteriorly requiring min A to recover.                             Pertinent Vitals/Pain Pain Assessment Pain Assessment: No/denies pain    Home Living Family/patient expects to be discharged to:: Private residence Living Arrangements: Spouse/significant other;Other relatives (63 yo granddaughter) Available Help at Discharge: Family;Available 24 hours/day Type of Home: House Home Access: Stairs to enter Entrance Stairs-Rails: None Entrance Stairs-Number of Steps: 2   Home Layout: One level Home Equipment: Conservation officer, nature (2 wheels);Cane - single point;Shower seat;Grab bars - tub/shower;BSC/3in1      Prior Function Prior Level of Function : Needs assist;Driving             Mobility Comments: uses cane for mobility, reports recent falls d/t "wooziness", working with HHPT ADLs Comments: reports typically Modified Independent with ADLs (though has assist for socks at home), able to complete basic IADls     Hand Dominance        Extremity/Trunk Assessment   Upper Extremity Assessment Upper Extremity Assessment: Defer to OT evaluation    Lower Extremity Assessment Lower Extremity Assessment: LLE deficits/detail;RLE deficits/detail RLE Deficits / Details: ROM WFL; MMT 5/5 LLE Deficits / Details: ROM WFL; MMT 5/5    Cervical / Trunk Assessment Cervical / Trunk Assessment: Normal  Communication   Communication: No difficulties  Cognition Arousal/Alertness: Awake/alert Behavior During Therapy: WFL for tasks assessed/performed, Impulsive Overall Cognitive Status: No family/caregiver present to determine baseline cognitive functioning                                 General Comments: Able to answer orientation questions and follow directions fairly consistently. attention deficits and some impulsivity noted as pt  continued to attempt mobility despite balance deficits and reported "wooziness", tangential at times        General Comments General comments (skin integrity, edema, etc.): Pt was on RA at arrival and sats stable.  Noted pt had orthostatic episode with OT earlier , so BP monitored with transfers.  BP consistently systolic [phone] and diastolic 83-38 throughout transfers.  (Pt transported and numbers lost off monitored before this PT wrote them down, but did monitor during session and they were stable and in above range)  Pt transferred to EOB BP stable.  Pt then stood at EOB for a few seconds, BP running.  When turning to don gown pt started leaning posteriorly requiring assist to balance, stopped talking or responding to therapist and required therapist to back her to bed and facilitate hip flexion to sit.  BP completed and was stable.  PT became responsive and said "I didn't hit you did I, I don't remember anything."  Checked and BP still stable.  Once pt feeling better attempted again.  She remained alert, BP stable in standing and with walking.   Pt reports this was similar to episodes at home, but then also reports she feels like someone pushes her when she falls.       Exercises  Assessment/Plan    PT Assessment Patient needs continued PT services  PT Problem List Decreased strength;Decreased mobility;Decreased safety awareness;Decreased activity tolerance;Cardiopulmonary status limiting activity;Decreased cognition;Decreased balance;Decreased knowledge of use of DME       PT Treatment Interventions DME instruction;Therapeutic exercise;Gait training;Balance training;Stair training;Neuromuscular re-education;Functional mobility training;Cognitive remediation;Therapeutic activities;Patient/family education    PT Goals (Current goals can be found in the Care Plan section)  Acute Rehab PT Goals Patient Stated Goal: return home PT Goal Formulation: With patient Time For Goal  Achievement: 10/15/21 Potential to Achieve Goals: Good    Frequency Min 3X/week     Co-evaluation               AM-PAC PT "6 Clicks" Mobility  Outcome Measure Help needed turning from your back to your side while in a flat bed without using bedrails?: A Little Help needed moving from lying on your back to sitting on the side of a flat bed without using bedrails?: A Little Help needed moving to and from a bed to a chair (including a wheelchair)?: A Little Help needed standing up from a chair using your arms (e.g., wheelchair or bedside chair)?: A Little Help needed to walk in hospital room?: A Little Help needed climbing 3-5 steps with a railing? : A Lot 6 Click Score: 17    End of Session Equipment Utilized During Treatment: Gait belt Activity Tolerance: Patient tolerated treatment well Patient left: in bed;with call bell/phone within reach (on stretcher with transport present) Nurse Communication: Mobility status PT Visit Diagnosis: Other abnormalities of gait and mobility (R26.89);Muscle weakness (generalized) (M62.81);History of falling (Z91.81)    Time: 5465-6812 PT Time Calculation (min) (ACUTE ONLY): 23 min   Charges:   PT Evaluation $PT Eval Low Complexity: 1 Low PT Treatments $Therapeutic Activity: 8-22 mins        Abran Richard, PT Acute Rehab [facility] [phone]   Karlton Lemon 10/01/2021, 3:28 PM

## 2021-10-02 DIAGNOSIS — J441 Chronic obstructive pulmonary disease with (acute) exacerbation: Secondary | ICD-10-CM | POA: Diagnosis not present

## 2021-10-02 DIAGNOSIS — A419 Sepsis, unspecified organism: Secondary | ICD-10-CM | POA: Diagnosis not present

## 2021-10-02 DIAGNOSIS — J189 Pneumonia, unspecified organism: Secondary | ICD-10-CM | POA: Diagnosis not present

## 2021-10-02 DIAGNOSIS — R6520 Severe sepsis without septic shock: Secondary | ICD-10-CM | POA: Diagnosis not present

## 2021-10-02 LAB — BASIC METABOLIC PANEL
Anion gap: 6 (ref 5–15)
BUN: 12 mg/dL (ref 8–23)
CO2: 24 mmol/L (ref 22–32)
Calcium: 8.8 mg/dL — ABNORMAL LOW (ref 8.9–10.3)
Chloride: 105 mmol/L (ref 98–111)
Creatinine, Ser: 0.71 mg/dL (ref 0.44–1.00)
GFR, Estimated: >60 mL/min (ref >60)
Glucose, Bld: 101 mg/dL — ABNORMAL HIGH (ref 70–99)
Potassium: 3.7 mmol/L (ref 3.5–5.1)
Sodium: 135 mmol/L (ref 135–145)

## 2021-10-02 LAB — CBC
HCT: 26.4 % — ABNORMAL LOW (ref 36.0–46.0)
Hemoglobin: 7.7 g/dL — ABNORMAL LOW (ref 12.0–15.0)
MCH: 20.4 pg — ABNORMAL LOW (ref 26.0–34.0)
MCHC: 29.2 g/dL — ABNORMAL LOW (ref 30.0–36.0)
MCV: 70 fL — ABNORMAL LOW (ref 80.0–100.0)
Platelets: 182 10*3/uL (ref 150–400)
RBC: 3.77 MIL/uL — ABNORMAL LOW (ref 3.87–5.11)
RDW: 22.5 % — ABNORMAL HIGH (ref 11.5–15.5)
WBC: 8.4 10*3/uL (ref 4.0–10.5)
nRBC: 0 % (ref 0.0–0.2)

## 2021-10-02 MED ORDER — POLYETHYLENE GLYCOL 3350 17 G PO PACK
17.0000 g | PACK | Freq: Every day | ORAL | Status: DC
  Administered 2021-10-02 – 2021-10-03 (×2): 17 g via ORAL
  Filled 2021-10-02 (×2): qty 1

## 2021-10-02 MED ORDER — CEFDINIR 300 MG PO CAPS
300.0000 mg | ORAL_CAPSULE | Freq: Two times a day (BID) | ORAL | Status: DC
  Administered 2021-10-02 – 2021-10-03 (×3): 300 mg via ORAL
  Filled 2021-10-02 (×3): qty 1

## 2021-10-02 MED ORDER — BENZONATATE 100 MG PO CAPS: 100.0000 mg | ORAL_CAPSULE | Freq: Two times a day (BID) | ORAL | Status: DC | PRN

## 2021-10-02 MED ORDER — SENNA 8.6 MG PO TABS
1.0000 | ORAL_TABLET | Freq: Every day | ORAL | Status: DC
  Administered 2021-10-02: 8.6 mg via ORAL
  Filled 2021-10-02 (×2): qty 1

## 2021-10-02 NOTE — Progress Notes (Addendum)
Daily Progress Note Intern Pager: [phone]  Patient name: [name] Medical record number: [id number] Date of birth: 1950-12-07 Age: 71 y.o. Gender: female  Primary Care Provider: Alvester Chou, NP Consultants: none Code Status: Full  Pt Overview and Major Events to Date:  9/17: Admit to FMTS   Assessment and Plan: CHONDRA BOYDE is a 71 y.o. female presenting with SOB and chest pain with community acquired PNA vs. COPD exacerbation.    PMHx: history of chronic IDA, CVA (2017), COPD, tobacco use, PAD s/p R SFA angioplasty, colon polyps (2018), gastroparesis, and diverticular disease   * Severe sepsis ([place]) Meets severe sepsis criteria with tachycardia, leukocytosis, source of infection, and lactic acidosis. Sepsis protocol. Has been afebrile. COVID negative. BCx preliminary NGTD. Lactate downtrended.  - narrow abx to ceftriaxone and azithromycin - Continue to monitor fever curve  - PT/OT - Continuous telemetry and pulse ox - Supplemental O2 to maintain sats >92%. Continue to wean O2.   Community acquired pneumonia Likely CAP given patient was living at home prior to coming to the ED. Chronic bronchitis previously seen by pulmonology but was only on PRN albulterol. Lobar PNA on CXR. BCx NGTD. Will start steroids given continued wheezing and O2 requirement overnight. She has been afebrile. Weaned to 1L O2 and satting well. No tachycardia, hypotension, or tachypnea.  - Narrowed cefepime to rocephin and azithromycin. 5 day abx course from 9/17-9/21. Transitioned from IV ceftriaxone to cefdinir.  - Duonebs q4h PRN  - Supplemental O2 to maintain sats > 92% - 5 day steroid course from 9/18-9/22 - added tessalon pearls for ongoing coughing   Chest pain Chest pain is in the setting of ongoing coughing and likely related to coughing exacerbation. Troponin unremarkable. EKG with sinus tachycardia in the room.  -tylenol and ibuprofen PRN -one time EKG  Iron deficiency  anemia History of iron deficiency anemia diagnosed in 2018 related to chronic blood loss and poor iron absorption. No iron during hospitalization while she has an active infection. -outpatient iron studies and follow up with heme/onc.   FEN/GI:  heart healthy diet  PPx: lovenox Dispo: home with home health OT/PT  Subjective:  No acute events overnight. She is still reporting SOB and chest pain associated with coughing. Denies any abdominal pain. She reports she has not had a BM in 5 days.   Objective: Temp:  [97.4 F (36.3 C)-98.2 F (36.8 C)] 98.2 F (36.8 C) (09/18 2050) Pulse Rate:  [82-105] 82 (09/19 0603) Resp:  [16-19] 16 (09/19 0603) BP: (126-139)/(49-77) 139/71 (09/19 0603) SpO2:  [94 %-100 %] 100 % (09/19 0603) Physical Exam: General: well-appearing woman in no acute distress Cardiovascular: RRR. No m/r/g. Respiratory: Satting well on 1L [place].  Abdomen: Soft, minimally tender to palpation Extremities: no peripheral edema   Laboratory: Most recent CBC Lab Results  Component Value Date   WBC 8.4 10/02/2021   HGB 7.7 (L) 10/02/2021   HCT 26.4 (L) 10/02/2021   MCV 70.0 (L) 10/02/2021   PLT 182 10/02/2021   Most recent BMP    Latest Ref Rng & Units 10/02/2021    1:21 AM  BMP  Glucose 70 - 99 mg/dL 101   BUN 8 - 23 mg/dL 12   Creatinine 0.44 - 1.00 mg/dL 0.71   Sodium 135 - 145 mmol/L 135   Potassium 3.5 - 5.1 mmol/L 3.7   Chloride 98 - 111 mmol/L 105   CO2 22 - 32 mmol/L 24   Calcium 8.9 -  10.3 mg/dL 8.8     Rolanda Lundborg, MD 10/02/2021, 7:03 AM  PGY-1, [place] Intern pager: [phone], text pages welcome Secure chat group [place]

## 2021-10-02 NOTE — Discharge Instructions (Signed)
Dear [name],   Thank you so much for allowing [place] to be part of your care!  You were admitted to [facility] for concern of pneumonia   We started you on Azithromycin and ceftriaxone for pneumonia coverage. I advise that you FINISH your antibiotics that we send you home with.  We are sending you home with tessalon pearls for your cough, please make sure these are not in reach of children as they can be dangerous for children.    Please complete your steroid dose as well.   Try to avoid sick contacts as able during this winter season and get your flu vaccine.  We also discussed smoking cessation - you can do it!    POST-HOSPITAL & CARE INSTRUCTIONS Follow up with your pcp  Make an appointment with pulmonology  Please let PCP/Specialists know of any changes that were made.  Please see medications section of this packet for any medication changes.   DOCTOR'S APPOINTMENT & FOLLOW UP CARE INSTRUCTIONS  Future Appointments  Date Time Provider [place]  10/04/2021 10:00 AM Levin Erp, PA [facility]    RETURN PRECAUTIONS: - worsening shortness of breath or chest pain - worsening fever or chills  - need for oxygen  - chest pain   Take care and be well!  [facility]  [place], [place] [address] [phone]

## 2021-10-02 NOTE — Progress Notes (Signed)
Mobility Specialist Criteria Algorithm Info.   10/02/21 1419  Mobility  Activity Refused mobility   Patient declined for unspecified reasons. Will d/u as time permits  [place]ang [place], [place], [place]  VAPOL:[phone] Office: [phone]

## 2021-10-02 NOTE — Progress Notes (Signed)
Physical Therapy Treatment Patient Details Name: [name] MRN: [id number] DOB: 04-23-1950 Today's Date: 10/02/2021   History of Present Illness Pt is a 71 y/o female presenting with SOB and chest pain. Admitted under sepsis protocol with further workup to rule out CAP vs COPD exacerbation. PMH: COPD, pre diabetes, cervical CA, HTn, GERD, HLD, seizures    PT Comments    Pt was seen for mobility attempt but declined over her concern that MD does not want to have her up.  Somewhat difficult to take in that she is OK with help, but did agree to do bed exercises with good effort.  Her plan is still to go home with assistance, as she has mult family members to help and her husband had actually assisted her up from the floor previously.  Follow for HHPT to carry on at home, with focus on goals of acute PT as are outlined below.   Recommendations for follow up therapy are one component of a multi-disciplinary discharge planning process, led by the attending physician.  Recommendations may be updated based on patient status, additional functional criteria and insurance authorization.  Follow Up Recommendations  Home health PT     Assistance Recommended at Discharge Frequent or constant Supervision/Assistance  Patient can return home with the following A little help with walking and/or transfers;A little help with bathing/dressing/bathroom;Assistance with cooking/housework;Help with stairs or ramp for entrance   Equipment Recommendations  None recommended by PT    Recommendations for Other Services       Precautions / Restrictions Precautions Precautions: Fall;Other (comment) Precaution Comments: episode of "blanking out" Restrictions Weight Bearing Restrictions: No     Mobility  Bed Mobility Overal bed mobility: Needs Assistance Bed Mobility: Supine to Sit, Sit to Supine     Supine to sit: Min guard Sit to supine: Min guard        Transfers                    General transfer comment: declined to try    Ambulation/Gait                   Stairs             Wheelchair Mobility    Modified Rankin (Stroke Patients Only)       Balance Overall balance assessment: Needs assistance Sitting-balance support: Feet supported Sitting balance-Leahy Scale: Good                                      Cognition Arousal/Alertness: Awake/alert Behavior During Therapy: WFL for tasks assessed/performed, Impulsive Overall Cognitive Status: No family/caregiver present to determine baseline cognitive functioning                                 General Comments: Pt is possibly not recalling information from MD, struggling to follow details of movement instructions at times        Exercises General Exercises - Lower Extremity Ankle Circles/Pumps: PROM, AROM, 5 reps Quad Sets: AROM, 10 reps Gluteal Sets: AROM, 10 reps Heel Slides: AROM, 10 reps Hip ABduction/ADduction: AROM, 10 reps Straight Leg Raises: AROM, 10 reps Hip Flexion/Marching: AROM, 10 reps    General Comments General comments (skin integrity, edema, etc.): Pt is up to side of bed but fearful to stand and walk despite her  instructions for mobility from PT      Pertinent Vitals/Pain Pain Assessment Pain Assessment: No/denies pain    Home Living                          Prior Function            PT Goals (current goals can now be found in the care plan section)      Frequency    Min 3X/week      PT Plan Current plan remains appropriate    Co-evaluation              AM-PAC PT "6 Clicks" Mobility   Outcome Measure  Help needed turning from your back to your side while in a flat bed without using bedrails?: A Little Help needed moving from lying on your back to sitting on the side of a flat bed without using bedrails?: A Little Help needed moving to and from a bed to a chair (including a wheelchair)?: A  Little Help needed standing up from a chair using your arms (e.g., wheelchair or bedside chair)?: A Little Help needed to walk in hospital room?: A Little Help needed climbing 3-5 steps with a railing? : A Lot 6 Click Score: 17    End of Session   Activity Tolerance: Patient limited by fatigue;Treatment limited secondary to medical complications (Comment) Patient left: in bed;with call bell/phone within reach Nurse Communication: Mobility status PT Visit Diagnosis: Other abnormalities of gait and mobility (R26.89);Muscle weakness (generalized) (M62.81);History of falling (Z91.81)     Time: 9574-7340 PT Time Calculation (min) (ACUTE ONLY): 15 min  Charges:  $Therapeutic Exercise: 8-22 mins    Ramond Dial 10/02/2021, 1:56 PM  Mee Hives, PT PhD Acute Rehab Dept. Number: [place]

## 2021-10-02 NOTE — Plan of Care (Signed)
  Problem: Education: Goal: Knowledge of General Education information will improve Description: Including pain rating scale, medication(s)/side effects and non-pharmacologic comfort measures Outcome: Progressing   Problem: Clinical Measurements: Goal: Ability to maintain clinical measurements within normal limits will improve Outcome: Progressing Goal: Diagnostic test results will improve Outcome: Progressing Goal: Cardiovascular complication will be avoided Outcome: Progressing   Problem: Nutrition: Goal: Adequate nutrition will be maintained Outcome: Progressing

## 2021-10-03 ENCOUNTER — Other Ambulatory Visit (HOSPITAL_COMMUNITY): Payer: Self-pay

## 2021-10-03 DIAGNOSIS — A419 Sepsis, unspecified organism: Secondary | ICD-10-CM | POA: Diagnosis not present

## 2021-10-03 DIAGNOSIS — J441 Chronic obstructive pulmonary disease with (acute) exacerbation: Secondary | ICD-10-CM | POA: Diagnosis not present

## 2021-10-03 DIAGNOSIS — D509 Iron deficiency anemia, unspecified: Secondary | ICD-10-CM | POA: Diagnosis not present

## 2021-10-03 DIAGNOSIS — J189 Pneumonia, unspecified organism: Secondary | ICD-10-CM | POA: Diagnosis not present

## 2021-10-03 LAB — CBC
HCT: 28.2 % — ABNORMAL LOW (ref 36.0–46.0)
Hemoglobin: 8.1 g/dL — ABNORMAL LOW (ref 12.0–15.0)
MCH: 20.5 pg — ABNORMAL LOW (ref 26.0–34.0)
MCHC: 28.7 g/dL — ABNORMAL LOW (ref 30.0–36.0)
MCV: 71.4 fL — ABNORMAL LOW (ref 80.0–100.0)
Platelets: 235 10*3/uL (ref 150–400)
RBC: 3.95 MIL/uL (ref 3.87–5.11)
RDW: 22 % — ABNORMAL HIGH (ref 11.5–15.5)
WBC: 6.4 10*3/uL (ref 4.0–10.5)
nRBC: 0 % (ref 0.0–0.2)

## 2021-10-03 MED ORDER — ASPIRIN 81 MG PO TBEC: 81.0000 mg | DELAYED_RELEASE_TABLET | Freq: Every day | ORAL | 2 refills | Status: DC

## 2021-10-03 MED ORDER — AZITHROMYCIN 250 MG PO TABS
250.0000 mg | ORAL_TABLET | Freq: Every day | ORAL | 0 refills | Status: AC
  Filled 2021-10-03: qty 2, 2d supply, fill #0

## 2021-10-03 MED ORDER — BENZONATATE 100 MG PO CAPS
100.0000 mg | ORAL_CAPSULE | Freq: Two times a day (BID) | ORAL | 0 refills | Status: DC | PRN
  Filled 2021-10-03: qty 20, 10d supply, fill #0

## 2021-10-03 MED ORDER — CEFDINIR 300 MG PO CAPS
300.0000 mg | ORAL_CAPSULE | Freq: Two times a day (BID) | ORAL | 0 refills | Status: AC
  Filled 2021-10-03: qty 3, 2d supply, fill #0

## 2021-10-03 MED ORDER — PREDNISONE 20 MG PO TABS
40.0000 mg | ORAL_TABLET | Freq: Every day | ORAL | 0 refills | Status: DC
  Filled 2021-10-03: qty 2, 1d supply, fill #0

## 2021-10-03 NOTE — Discharge Summary (Addendum)
[facility] Discharge Summary  Patient name: [name] Medical record number: [id number] Date of birth: Oct 26, 1950 Age: 71 y.o. Gender: female Date of Admission: 09/30/2021 Date of Discharge: 10/03/2021 Admitting Physician: Zenia Resides, MD  Primary Care Provider: Alvester Chou, NP Consultants: none  Indication for Hospitalization: SOB   Brief Hospital Course:  [name] is a 71 y.o. female presenting with SOB and chest pain thought to be due to community acquired pneumonia vs COPD exacerbation. PMHx: history of chronic IDA, CVA (2017), COPD, tobacco use, PAD s/p R SFA angioplasty, colon polyps (2018), gastroparesis, and diverticular disease  Severe sepsis ([facility]) Met severe sepsis criteria with tachycardia, leukocytosis, source of infection (CAP), and lactic acidosis. Sepsis protocol initiate. Has been afebrile. Started on cefepime and narrowed to ceftriaxone and azithromycin, then transitioned to cefdinir (9/17-9/21). Bcx NGTD. COVID negative.   Community acquired PNA COPD exacerbation Given patient was living at home prior to coming to the ED. Chronic bronchitis, previously seen by pulmonology but was only on PRN albulterol. Last PFT in 01/2018. FEV1/FVC ratio not consistent with COPD but clinically appeared to have chronic bronchitis. Lobar PNA on CXR. BCx NGTD. Started steroids given continued wheezing, reported hx of chronic bronchitis and new O2 requirement overnight. She was weaned off O2. Plan for steroid course 9/18-9/22 and finishing cefdinir. Recommended that she follow-up with pulmonology for potential repeat PFT and considering LAMA/LABA combo if PFT consistent with COPD.    Iron deficiency anemia History of iron deficiency anemia diagnosed in 2018 related to chronic blood loss and poor iron absorption. No iron during hospitalization while she has an active infection. Stable anemia during hospitalization.    Discharge  Diagnoses/Problem List:  Principal Problem:   Severe sepsis [facility]) Active Problems:   Community acquired pneumonia   Chest pain   Iron deficiency anemia   Sepsis ([place])   COPD exacerbation ([place])  Disposition: Home with home health PT/OT  Discharge Condition: stable  Discharge Exam:  GEN: alert, well-appearing woman in no acute distress on RA CARDIO: RRR. No m/r/g. PULM: slight crackle in R lower lobe. Otherwise CTAB.  ABD: soft, non-tender, non-distended  EXT: No peripheral edema   Issues for Follow Up:  Follow-up anemia outpatient   Follow-up with GI  Follow up smoking cessation  Follow up lung nodule  Would assess for OSA with sleep study Follow up with pulmonology for potentially long-term LAMA or LAMA/LABA combo.   Significant Procedures: none  Significant Labs and Imaging:  Recent Labs  Lab 10/02/21 0121 10/03/21 0150  WBC 8.4 6.4  HGB 7.7* 8.1*  HCT 26.4* 28.2*  PLT 182 235   Recent Labs  Lab 10/02/21 0121  NA 135  K 3.7  CL 105  CO2 24  GLUCOSE 101*  BUN 12  CREATININE 0.71  CALCIUM 8.8*   CXR:  1. Airspace opacity in the superior segment of the right lower lobe consistent with pneumonia. Bcx: NGTD  COVID negative   Discharge Medications:  Allergies as of 10/03/2021       Reactions   [place] Venom Anaphylaxis   Morphine And Related Other (See Comments)   Overly sedated   Neurontin [gabapentin] Other (See Comments)   Nightmares    Codeine Hives   Adhesive [tape] Itching, Other (See Comments)   Redness, Paper tape is ok   Sulfa Antibiotics Other (See Comments)   Childhood allergy Unknown reaction   Sulfonamide Derivatives Other (See Comments)   Childhood allergy Unknown reaction  Medication List     TAKE these medications    acetaminophen 325 MG tablet Commonly known as: TYLENOL Take 650 mg by mouth 2 (two) times daily as needed for headache.   albuterol 108 (90 Base) MCG/ACT inhaler Commonly known as: VENTOLIN HFA Inhale  2 puffs into the lungs 2 (two) times daily as needed for wheezing or shortness of breath. What changed: Another medication with the same name was changed. Make sure you understand how and when to take each.   albuterol (2.5 MG/3ML) 0.083% nebulizer solution Commonly known as: PROVENTIL Inhale 3 mLs into the lungs every 4 (four) hours as needed for wheezing or shortness of breath (cough). What changed: when to take this   aspirin EC 81 MG tablet Take 1 tablet (81 mg total) by mouth daily. Swallow whole. What changed:  medication strength how much to take additional instructions   azithromycin 250 MG tablet Commonly known as: ZITHROMAX Take 1 tablet (250 mg total) by mouth daily for 2 days. MAY ADMINISTER WITHOUT REGARD TO MEALS   benzonatate 100 MG capsule Commonly known as: TESSALON Take 1 capsule (100 mg total) by mouth 2 (two) times daily as needed for cough.   bisacodyl 10 MG/30ML Enem Commonly known as: FLEET Place 10 mg rectally once as needed (constipation).   busPIRone 5 MG tablet Commonly known as: BUSPAR Take 1 tablet (5 mg total) by mouth 2 (two) times daily. What changed: when to take this   cefdinir 300 MG capsule Commonly known as: OMNICEF Take 1 capsule (300 mg total) by mouth every 12 (twelve) hours for 2 days.   cyanocobalamin 1000 MCG/ML injection Commonly known as: VITAMIN B12 Inject 1,000 mcg into the muscle every 30 (thirty) days.   docusate sodium 100 MG capsule Commonly known as: COLACE Take 200 mg by mouth daily as needed (constipation).   DULoxetine 60 MG capsule Commonly known as: CYMBALTA Take 60 mg by mouth at bedtime.   hydrocortisone 25 MG suppository Commonly known as: ANUSOL-HC Place 1 suppository (25 mg total) rectally 2 (two) times daily as needed for hemorrhoids or anal itching.   linaclotide 290 MCG Caps capsule Commonly known as: Linzess Take 1 capsule (290 mcg total) by mouth daily before breakfast.   methocarbamol 500 MG  tablet Commonly known as: Robaxin Take 1 tablet (500 mg total) by mouth every 6 (six) hours as needed for muscle spasms. What changed: when to take this   metoCLOPramide 5 MG tablet Commonly known as: REGLAN Take 1 tablet by mouth 3 times daily 20-30 minutes before meals NO FURTHER REFILLS UNTIL SEEN, NEEDS AND APPOINTMENT What changed:  how much to take how to take this when to take this additional instructions   nitroGLYCERIN 0.4 MG SL tablet Commonly known as: NITROSTAT Place 0.4 mg under the tongue every 5 (five) minutes x 3 doses as needed for chest pain.   Oxycodone HCl 10 MG Tabs Take 10 mg by mouth 5 (five) times daily as needed (for moderate pain).   pantoprazole 40 MG tablet Commonly known as: PROTONIX Take 40 mg by mouth daily as needed (acid reflux).   predniSONE 20 MG tablet Commonly known as: DELTASONE Take 2 tablets (40 mg total) by mouth daily with breakfast.   rOPINIRole 1 MG tablet Commonly known as: REQUIP Take 1 mg by mouth 2 (two) times daily.   rosuvastatin 10 MG tablet Commonly known as: CRESTOR Take 10 mg by mouth at bedtime.   senna 8.6 MG tablet Commonly known  asDonavan Burnet Take 17.2 mg by mouth daily as needed for constipation.   traZODone 50 MG tablet Commonly known as: DESYREL TAKE 0.5-1 TABLETS (25-50 MG TOTAL) BY MOUTH AT BEDTIME AS NEEDED FOR SLEEP. What changed:  how much to take when to take this   Vitamin D-3 25 MCG (1000 UT) Caps Take 2,000 Units by mouth daily.        Discharge Instructions: Please refer to Patient Instructions section of EMR for full details.  Patient was counseled important signs and symptoms that should prompt return to medical care, changes in medications, dietary instructions, activity restrictions, and follow up appointments.     Erskine Emery, MD 10/03/2021, 11:45 AM PGY-1, [place]   I agree with resident Dr. Daron Offer note.

## 2021-10-03 NOTE — Progress Notes (Signed)
Nsg Discharge Note  Admit Date:  09/30/2021 Discharge date: 10/03/2021   Dead[place] to be D/C'd Home per MD order.  AVS completed. Patient/caregiver able to verbalize understanding.  Discharge Medication: Allergies as of 10/03/2021       Reactions   [place] Venom Anaphylaxis   Morphine And Related Other (See Comments)   Overly sedated   Neurontin [gabapentin] Other (See Comments)   Nightmares    Codeine Hives   Adhesive [tape] Itching, Other (See Comments)   Redness, Paper tape is ok   Sulfa Antibiotics Other (See Comments)   Childhood allergy Unknown reaction   Sulfonamide Derivatives Other (See Comments)   Childhood allergy Unknown reaction        Medication List     TAKE these medications    acetaminophen 325 MG tablet Commonly known as: TYLENOL Take 650 mg by mouth 2 (two) times daily as needed for headache.   albuterol 108 (90 Base) MCG/ACT inhaler Commonly known as: VENTOLIN HFA Inhale 2 puffs into the lungs 2 (two) times daily as needed for wheezing or shortness of breath. What changed: Another medication with the same name was changed. Make sure you understand how and when to take each.   albuterol (2.5 MG/3ML) 0.083% nebulizer solution Commonly known as: PROVENTIL Inhale 3 mLs into the lungs every 4 (four) hours as needed for wheezing or shortness of breath (cough). What changed: when to take this   aspirin EC 81 MG tablet Take 1 tablet (81 mg total) by mouth daily. Swallow whole. What changed:  medication strength how much to take additional instructions   azithromycin 250 MG tablet Commonly known as: ZITHROMAX Take 1 tablet (250 mg total) by mouth daily for 2 days. MAY ADMINISTER WITHOUT REGARD TO MEALS   benzonatate 100 MG capsule Commonly known as: TESSALON Take 1 capsule (100 mg total) by mouth 2 (two) times daily as needed for cough.   bisacodyl 10 MG/30ML Enem Commonly known as: FLEET Place 10 mg rectally once as needed  (constipation).   busPIRone 5 MG tablet Commonly known as: BUSPAR Take 1 tablet (5 mg total) by mouth 2 (two) times daily. What changed: when to take this   cefdinir 300 MG capsule Commonly known as: OMNICEF Take 1 capsule (300 mg total) by mouth every 12 (twelve) hours for 2 days.   cyanocobalamin 1000 MCG/ML injection Commonly known as: VITAMIN B12 Inject 1,000 mcg into the muscle every 30 (thirty) days.   docusate sodium 100 MG capsule Commonly known as: COLACE Take 200 mg by mouth daily as needed (constipation).   DULoxetine 60 MG capsule Commonly known as: CYMBALTA Take 60 mg by mouth at bedtime.   hydrocortisone 25 MG suppository Commonly known as: ANUSOL-HC Place 1 suppository (25 mg total) rectally 2 (two) times daily as needed for hemorrhoids or anal itching.   linaclotide 290 MCG Caps capsule Commonly known as: Linzess Take 1 capsule (290 mcg total) by mouth daily before breakfast.   methocarbamol 500 MG tablet Commonly known as: Robaxin Take 1 tablet (500 mg total) by mouth every 6 (six) hours as needed for muscle spasms. What changed: when to take this   metoCLOPramide 5 MG tablet Commonly known as: REGLAN Take 1 tablet by mouth 3 times daily 20-30 minutes before meals NO FURTHER REFILLS UNTIL SEEN, NEEDS AND APPOINTMENT What changed:  how much to take how to take this when to take this additional instructions   nitroGLYCERIN 0.4 MG SL tablet Commonly known as: NITROSTAT  Place 0.4 mg under the tongue every 5 (five) minutes x 3 doses as needed for chest pain.   Oxycodone HCl 10 MG Tabs Take 10 mg by mouth 5 (five) times daily as needed (for moderate pain).   pantoprazole 40 MG tablet Commonly known as: PROTONIX Take 40 mg by mouth daily as needed (acid reflux).   predniSONE 20 MG tablet Commonly known as: DELTASONE Take 2 tablets (40 mg total) by mouth daily with breakfast.   rOPINIRole 1 MG tablet Commonly known as: REQUIP Take 1 mg by mouth 2  (two) times daily.   rosuvastatin 10 MG tablet Commonly known as: CRESTOR Take 10 mg by mouth at bedtime.   senna 8.6 MG tablet Commonly known as: SENOKOT Take 17.2 mg by mouth daily as needed for constipation.   traZODone 50 MG tablet Commonly known as: DESYREL TAKE 0.5-1 TABLETS (25-50 MG TOTAL) BY MOUTH AT BEDTIME AS NEEDED FOR SLEEP. What changed:  how much to take when to take this   Vitamin D-3 25 MCG (1000 UT) Caps Take 2,000 Units by mouth daily.        Discharge Assessment: Vitals:   10/03/21 0540 10/03/21 0813  BP: (!) 140/75 (!) 161/80  Pulse: 92 75  Resp: 18 18  Temp: 98.4 F (36.9 C) 98.6 F (37 C)  SpO2: 96% 100%   Skin clean, dry and intact without evidence of skin break down, no evidence of skin tears noted. IV catheter discontinued intact. Site without signs and symptoms of complications - no redness or edema noted at insertion site, patient denies c/o pain - only slight tenderness at site.  Dressing with slight pressure applied.  D/c Instructions-Education: Discharge instructions given to patient/family with verbalized understanding. D/c education completed with patient/family including follow up instructions, medication list, d/c activities limitations if indicated, with other d/c instructions as indicated by MD - patient able to verbalize understanding, all questions fully answered. Patient instructed to return to ED, call 911, or call MD for any changes in condition.  Patient escorted via [place], and D/C home via private auto.  Atilano Ina, RN 10/03/2021 11:12 AM

## 2021-10-03 NOTE — Progress Notes (Signed)
Mobility Specialist Progress Note   10/03/21 1039  Mobility  Activity Ambulated with assistance in room  Level of Assistance Minimal assist, patient does 75% or more  Assistive Device Front wheel walker  Distance Ambulated (ft) 76 ft  Activity Response Tolerated well  $Mobility charge 1 Mobility   Pre Mobility: 72 HR, 166/68 BP During Mobility: 87 HR Post Mobility: 84 HR, 170/78 BP  Pt presenting today on EOB hypertensive but having no complaints. Requiring minA on initial stand at bedside d/t slight LOB + minG during ambulation d/t R hip dipping into flexion multiple times. Pt claims this is a normal occurrence at home but not the causes of there unsteadiness. Able to return back to EOB after x1 seated break, Placed call bell in reach and notified RN about BP.   Holland Falling Mobility Specialist [facility] #:  [phone] Acute Rehab Office:  [phone]

## 2021-10-04 ENCOUNTER — Ambulatory Visit: Payer: Medicare Other | Admitting: Physician Assistant

## 2021-10-05 LAB — CULTURE, BLOOD (ROUTINE X 2)
Culture: NO GROWTH
Culture: NO GROWTH
Special Requests: ADEQUATE

## 2021-10-15 ENCOUNTER — Other Ambulatory Visit: Payer: Self-pay | Admitting: Adult Health

## 2021-10-15 DIAGNOSIS — R911 Solitary pulmonary nodule: Secondary | ICD-10-CM

## 2021-10-15 DIAGNOSIS — R0989 Other specified symptoms and signs involving the circulatory and respiratory systems: Secondary | ICD-10-CM

## 2021-10-22 ENCOUNTER — Ambulatory Visit (INDEPENDENT_AMBULATORY_CARE_PROVIDER_SITE_OTHER): Payer: Medicare Other

## 2021-10-22 ENCOUNTER — Encounter: Payer: Self-pay | Admitting: Pulmonary Disease

## 2021-10-22 ENCOUNTER — Ambulatory Visit: Payer: Medicare Other | Admitting: Pulmonary Disease

## 2021-10-22 ENCOUNTER — Telehealth: Payer: Self-pay | Admitting: Pulmonary Disease

## 2021-10-22 ENCOUNTER — Other Ambulatory Visit: Payer: Self-pay | Admitting: *Deleted

## 2021-10-22 VITALS — BP 136/62 | HR 90 | Temp 98.5°F | Ht 61.0 in | Wt 195.4 lb

## 2021-10-22 DIAGNOSIS — J189 Pneumonia, unspecified organism: Secondary | ICD-10-CM | POA: Diagnosis not present

## 2021-10-22 DIAGNOSIS — R918 Other nonspecific abnormal finding of lung field: Secondary | ICD-10-CM | POA: Insufficient documentation

## 2021-10-22 DIAGNOSIS — Z720 Tobacco use: Secondary | ICD-10-CM | POA: Insufficient documentation

## 2021-10-22 DIAGNOSIS — J441 Chronic obstructive pulmonary disease with (acute) exacerbation: Secondary | ICD-10-CM

## 2021-10-22 DIAGNOSIS — R911 Solitary pulmonary nodule: Secondary | ICD-10-CM | POA: Diagnosis not present

## 2021-10-22 MED ORDER — LEVOFLOXACIN 500 MG PO TABS: 500.0000 mg | ORAL_TABLET | Freq: Every day | ORAL | 0 refills | Status: DC

## 2021-10-22 NOTE — Assessment & Plan Note (Signed)
New right lower lobe pulmonary nodule was noted on CT scan 04/2021. We will let current infiltrate resolve over a 4-week period  Before repeating CT chest without contrast

## 2021-10-22 NOTE — Assessment & Plan Note (Signed)
She does not clearly seem to be having an exacerbation and does not need steroids. She has albuterol nebs which she will continue

## 2021-10-22 NOTE — Assessment & Plan Note (Signed)
Extensive counseling to cut down/quit smoking. Asked her to restart nicotine patches while she is cutting down

## 2021-10-22 NOTE — Telephone Encounter (Signed)
Received call report from [facility] on patient's cxr done on 10/22/21. Dr. Elsworth Soho, please review the result/impression copied below:  IMPRESSION: Persistent right hilar prominence with similar to mildly improved nodular area of consolidation right mid lung. Findings may represent persistent infectious process however underlying mass/right hilar adenopathy not excluded. Recommend further evaluation with contrast-enhanced chest CT to exclude underlying mass.  Please advise, thank you.  Dr. Elsworth Soho is aware of the results. Order for ct has been placed.

## 2021-10-22 NOTE — Patient Instructions (Addendum)
X Rx for levaquin 500 mg daily x 7 days  X CT chest without contrast in 1 month  Delsym OTC 5 by mouth twice daily as needed for cough

## 2021-10-22 NOTE — Progress Notes (Signed)
Subjective:    Patient ID: [name], female    DOB: 09-06-1950, 71 y.o.   MRN: [id number]  HPI  Chief Complaint  Patient presents with   Consult    Pt states she had pneumonia around 09/30/2021 and was told to come to see a pulmonologist    71 year old obese smoker presents to establish care after recent hospitalization for community-acquired pneumonia  PMHx: history of chronic IDA, CVA (2017), COPD, tobacco use, PAD s/p R SFA angioplasty, colon polyps (2018), gastroparesis, and diverticular disease  -seen by MW , last 01/2018 , when she was referred for shortness of breath and cough, attributed to chronic bronchitis.  Surprisingly PFTs did not show significant airway obstruction but did show a low DLCO  -She smoked about a pack per days starting as a teenager, more than 50 pack years, more recently she has cut down to half pack per day  She was admitted 9/17 to 9/20 with SOB and chest pain thought to be due to community acquired pneumonia vs COPD exacerbation -CXR showed Airspace opacity in the superior segment of the right lower lobe Started on cefepime and narrowed to ceftriaxone and azithromycin, then transitioned to cefdinir (9/17-9/21). Bcx NGTD. COVID negative.  She was weaned off O2. Plan for steroid course 9/18-9/22   On office visit today, she reports that her shortness of breath is unchanged, she reports cough productive of yellow-green sputum for the last 3 days, she developed right pleuritic chest pain 3 days ago radiating to her back especially when she coughs.  Saturation is 94% on arrival today.  She ports a low-grade temperature 2 days ago for which she took ibuprofen  Labs show mild hypokalemia, hemoglobin 7.7, MCV 70, platelets 182  Significant tests/ events reviewed   LDCT chest 04/2021 - RADS 4A , New right lower lobe pulmonary nodule 7.5 mm. Compared to 03/2020  PFT 01/2018 no airway obstruction, ratio 82, FEV1 86%, FVC 79%, TLC 88%, DLCO 12.5/59%  Arlyce Harman  11/2017 normal  Past Medical History:  Diagnosis Date   Anxiety    Aortic atherosclerosis (HCC)    Bilateral cataracts    Carpal tunnel syndrome    Cervical cancer (HCC)    Chronic back pain    Claudication (HCC)    COPD (chronic obstructive pulmonary disease) (HCC)    wheezing   DDD (degenerative disc disease), cervical    DDD (degenerative disc disease), lumbar    Depression    Diverticulosis    Gastroparesis    GERD (gastroesophageal reflux disease)    Hearing loss    Bilateral   History of blood transfusion    History of colon polyps    History of migraine    Hypertension    Internal hemorrhoids    Iron deficiency anemia    Left-sided weakness    Leg swelling    Liver disease    pt unaware   Lung nodule    several small   OA (osteoarthritis)    both hands   Positive ANA (antinuclear antibody)    Pre-diabetes    Raynaud's phenomenon    Restless leg    Seizures (HCC)    no meds for 8 months   Sleep apnea    does not use her cpap   Spondylosis    Stroke ([place]) 2015   no weakness   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   rt   Urge incontinence of urine    Varicose vein of leg    Venous  insufficiency    Past Surgical History:  Procedure Laterality Date   BACK SURGERY     BIOPSY  08/31/2021   Procedure: BIOPSY;  Surgeon: Thornton Park, MD;  Location: [place];  Service: Gastroenterology;;   BLEPHAROPLASTY  10/10/2017   CATARACT EXTRACTION, BILATERAL     CESAREAN SECTION     CHOLECYSTECTOMY     COLONOSCOPY  10/2016   COLONOSCOPY WITH PROPOFOL N/A 08/31/2021   Procedure: COLONOSCOPY WITH PROPOFOL;  Surgeon: Thornton Park, MD;  Location: [place];  Service: Gastroenterology;  Laterality: N/A;   ESOPHAGOGASTRODUODENOSCOPY (EGD) WITH PROPOFOL N/A 08/31/2021   Procedure: ESOPHAGOGASTRODUODENOSCOPY (EGD) WITH PROPOFOL;  Surgeon: Thornton Park, MD;  Location: [place];  Service: Gastroenterology;  Laterality: N/A;   HAND SURGERY Bilateral    carpal  tunnel   HEMORRHOID SURGERY     INTERSTIM IMPLANT PLACEMENT     KNEE SURGERY Left    arthroscopy x3   LOWER EXTREMITY ANGIOGRAPHY N/A 07/16/2016   Procedure: Lower Extremity Angiography;  Surgeon: Adrian Prows, MD;  Location: [place] [facility];  Service: Cardiovascular;  Laterality: N/A;   LOWER EXTREMITY INTERVENTION N/A 08/06/2016   Procedure: Lower Extremity Intervention;  Surgeon: Adrian Prows, MD;  Location: [place] [facility];  Service: Cardiovascular;  Laterality: N/A;   NECK SURGERY     OTHER SURGICAL HISTORY  2013   bladder stimulator in back   PERIPHERAL VASCULAR BALLOON ANGIOPLASTY  08/06/2016   Procedure: Peripheral Vascular Balloon Angioplasty;  Surgeon: Adrian Prows, MD;  Location: [place] [facility];  Service: Cardiovascular;;  Right SFA   POLYPECTOMY  08/31/2021   Procedure: POLYPECTOMY;  Surgeon: Thornton Park, MD;  Location: [facility];  Service: Gastroenterology;;   TENNIS ELBOW RELEASE/NIRSCHEL PROCEDURE Right 12/2015   TOE SURGERY Right    right foot second and fourth toe   TOTAL HIP ARTHROPLASTY Left    TOTAL HIP REVISION Left 10/13/2017   Procedure: LEFT TOTAL HIP REVISION ACETABULUM, OPEN REDUCTION INTERNAL WITH BONE GRAFT FIXATION LEFT GREATER TROCHANTERIC FRACTURE;  Surgeon: Frederik Pear, MD;  Location: [facility];  Service: Orthopedics;  Laterality: Left;   TOTAL KNEE ARTHROPLASTY Right 01/12/2016   Procedure: RIGHT TOTAL KNEE ARTHROPLASTY;  Surgeon: Netta Cedars, MD;  Location: [place];  Service: Orthopedics;  Laterality: Right;   TUBAL LIGATION     UPPER GI ENDOSCOPY  10/2016   VAGINAL HYSTERECTOMY      Allergies  Allergen Reactions   Bee Venom Anaphylaxis   Morphine And Related Other (See Comments)    Overly sedated   Neurontin [Gabapentin] Other (See Comments)    Nightmares    Codeine Hives   Adhesive [Tape] Itching and Other (See Comments)    Redness, Paper tape is ok   Sulfa Antibiotics Other (See Comments)    Childhood allergy Unknown reaction    Sulfonamide Derivatives Other (See Comments)    Childhood allergy Unknown reaction    Social History   Socioeconomic History   Marital status: Married    Spouse name: sammy   Number of children: 1   Years of education: Not on file   Highest education level: Associate degree: occupational, Hotel manager, or vocational program  Occupational History   Not on file  Tobacco Use   Smoking status: Every Day    Packs/day: 0.50    Years: 55.00    Total pack years: 27.50    Types: Cigarettes    Passive exposure: Past   Smokeless tobacco: Never   Tobacco comments:    Pt states  she smokes a half pack daily  Vaping Use   Vaping Use: Never used  Substance and Sexual Activity   Alcohol use: No    Alcohol/week: 0.0 standard drinks of alcohol   Drug use: Not Currently   Sexual activity: Not Currently    Birth control/protection: Post-menopausal, Surgical    Comment: hyst  Other Topics Concern   Not on file  Social History Narrative   Not on file   Social Determinants of Health   Financial Resource Strain: Medium Risk (04/14/2020)   Overall Financial Resource Strain (CARDIA)    Difficulty of Paying Living Expenses: Somewhat hard  Food Insecurity: No Food Insecurity (04/14/2020)   Hunger Vital Sign    Worried About Running Out of Food in the Last Year: Never true    Ran Out of Food in the Last Year: Never true  Transportation Needs: No Transportation Needs (04/14/2020)   PRAPARE - Hydrologist (Medical): No    Lack of Transportation (Non-Medical): No  Physical Activity: Unknown (04/14/2020)   Exercise Vital Sign    Days of Exercise per Week: Not on file    Minutes of Exercise per Session: 30 min  Stress: No Stress Concern Present (04/14/2020)   [place]    Feeling of Stress : Only a little  Social Connections: Moderately Integrated (04/14/2020)   Social Connection and Isolation Panel [NHANES]     Frequency of Communication with Friends and Family: More than three times a week    Frequency of Social Gatherings with Friends and Family: Once a week    Attends Religious Services: More than 4 times per year    Active Member of [employer] or Organizations: No    Attends Archivist Meetings: Never    Marital Status: Married  Human resources officer Violence: Not At Risk (04/14/2020)   Humiliation, Afraid, Rape, and Kick questionnaire    Fear of Current or Ex-Partner: No    Emotionally Abused: No    Physically Abused: No    Sexually Abused: No    Family History  Problem Relation Age of Onset   Hyperlipidemia Mother    Hypertension Mother    Anxiety disorder Mother    Depression Mother    Hyperlipidemia Father    Hypertension Father    Alzheimer's disease Father    Anxiety disorder Brother    Cancer Daughter        unknown   Anxiety disorder Brother    Anxiety disorder Brother    Drug abuse Brother    Stroke Brother    Colon cancer Neg Hx    Esophageal cancer Neg Hx    Rectal cancer Neg Hx    Stomach cancer Neg Hx      Review of Systems Shortness of breath with activity Productive cough Chest pain Acid heartburn and indigestion Sore throat Headaches Anxiety and depression Feet swelling Joint stiffness     Objective:   Physical Exam  Gen. Pleasant, obese, in no distress, normal affect ENT - no pallor,icterus, no post nasal drip, class 2 airway Neck: No JVD, no thyromegaly, no carotid bruits Lungs: no use of accessory muscles, no dullness to percussion, decreased without rales or rhonchi  Cardiovascular: Rhythm regular, heart sounds  normal, no murmurs or gallops, no peripheral edema Abdomen: soft and non-tender, no hepatosplenomegaly, BS normal. Musculoskeletal: No deformities, no cyanosis or clubbing Neuro:  alert, non focal, no tremors  Assessment & Plan:

## 2021-10-22 NOTE — Assessment & Plan Note (Signed)
Repeat chest x-ray today shows persistent right lower lobe infiltrate.  This could just be delayed resolution. But she has new pleuritic chest pain with some reproducible tenderness along her chest wall posteriorly, this may be due to persistent coughing and musculoskeletal. I have asked her to take Delsym OTC. We will treat her with Levaquin 1 days for the chance that pneumonia has not responded to previous antibiotics. She has some pain meds leftover and will take this as needed if the pain gets really worse, I explained that this would respond better to ibuprofen/Tylenol

## 2021-10-26 ENCOUNTER — Other Ambulatory Visit: Payer: Self-pay

## 2021-10-26 DIAGNOSIS — J441 Chronic obstructive pulmonary disease with (acute) exacerbation: Secondary | ICD-10-CM

## 2021-10-26 DIAGNOSIS — R079 Chest pain, unspecified: Secondary | ICD-10-CM

## 2021-10-26 DIAGNOSIS — R911 Solitary pulmonary nodule: Secondary | ICD-10-CM

## 2021-10-31 ENCOUNTER — Other Ambulatory Visit (INDEPENDENT_AMBULATORY_CARE_PROVIDER_SITE_OTHER): Payer: Medicare Other

## 2021-10-31 ENCOUNTER — Ambulatory Visit: Payer: Medicare Other | Admitting: Physician Assistant

## 2021-10-31 ENCOUNTER — Telehealth: Payer: Self-pay

## 2021-10-31 ENCOUNTER — Encounter: Payer: Self-pay | Admitting: Physician Assistant

## 2021-10-31 ENCOUNTER — Other Ambulatory Visit (HOSPITAL_COMMUNITY): Payer: Self-pay

## 2021-10-31 VITALS — BP 126/60 | HR 100 | Ht 61.0 in | Wt 195.2 lb

## 2021-10-31 DIAGNOSIS — D509 Iron deficiency anemia, unspecified: Secondary | ICD-10-CM | POA: Diagnosis not present

## 2021-10-31 DIAGNOSIS — J189 Pneumonia, unspecified organism: Secondary | ICD-10-CM

## 2021-10-31 DIAGNOSIS — K5909 Other constipation: Secondary | ICD-10-CM

## 2021-10-31 LAB — BASIC METABOLIC PANEL
BUN: 9 mg/dL (ref 6–23)
CO2: 28 mEq/L (ref 19–32)
Calcium: 8.6 mg/dL (ref 8.4–10.5)
Chloride: 99 mEq/L (ref 96–112)
Creatinine, Ser: 0.66 mg/dL (ref 0.40–1.20)
GFR: 87.74 mL/min (ref >60.00)
Glucose, Bld: 104 mg/dL — ABNORMAL HIGH (ref 70–99)
Potassium: 4 mEq/L (ref 3.5–5.1)
Sodium: 135 mEq/L (ref 135–145)

## 2021-10-31 LAB — CBC WITH DIFFERENTIAL/PLATELET
Basophils Absolute: 0 10*3/uL (ref 0.0–0.1)
Basophils Relative: 0.6 % (ref 0.0–3.0)
Eosinophils Absolute: 0.5 10*3/uL (ref 0.0–0.7)
Eosinophils Relative: 9.9 % — ABNORMAL HIGH (ref 0.0–5.0)
HCT: 26.2 % — ABNORMAL LOW (ref 36.0–46.0)
Hemoglobin: 8.1 g/dL — ABNORMAL LOW (ref 12.0–15.0)
Lymphocytes Relative: 18.8 % (ref 12.0–46.0)
Lymphs Abs: 1 10*3/uL (ref 0.7–4.0)
MCHC: 30.8 g/dL (ref 30.0–36.0)
MCV: 64.6 fl — ABNORMAL LOW (ref 78.0–100.0)
Monocytes Absolute: 0.4 10*3/uL (ref 0.1–1.0)
Monocytes Relative: 7.6 % (ref 3.0–12.0)
Neutro Abs: 3.4 10*3/uL (ref 1.4–7.7)
Neutrophils Relative %: 63.1 % (ref 43.0–77.0)
Platelets: 252 10*3/uL (ref 150.0–400.0)
RBC: 4.05 Mil/uL (ref 3.87–5.11)
RDW: 22.8 % — ABNORMAL HIGH (ref 11.5–15.5)
WBC: 5.3 10*3/uL (ref 4.0–10.5)

## 2021-10-31 LAB — IBC + FERRITIN
Ferritin: 13 ng/mL (ref 10.0–291.0)
Iron: 20 ug/dL — ABNORMAL LOW (ref 42–145)
Saturation Ratios: 4.4 % — ABNORMAL LOW (ref 20.0–50.0)
TIBC: 450.8 ug/dL — ABNORMAL HIGH (ref 250.0–450.0)
Transferrin: 322 mg/dL (ref 212.0–360.0)

## 2021-10-31 NOTE — Telephone Encounter (Signed)
Patient called states the facility would not be able to to do the blood work because they do not it. Please call to advise.

## 2021-10-31 NOTE — Patient Instructions (Signed)
We have sent the following medications to your pharmacy for you to pick up at your convenience: Linzess 290 mcg daily before breakfast.   _______________________________________________________  If you are age 72 or older, your body mass index should be between 23-30. Your Body mass index is 36.88 kg/m. If this is out of the aforementioned range listed, please consider follow up with your Primary Care Provider.  If you are age 49 or younger, your body mass index should be between 19-25. Your Body mass index is 36.88 kg/m. If this is out of the aformentioned range listed, please consider follow up with your Primary Care Provider.   ________________________________________________________  The [facility] providers would like to encourage you to use [facility] to communicate with providers for non-urgent requests or questions.  Due to long hold times on the telephone, sending your provider a message by [facility] may be a faster and more efficient way to get a response.  Please allow 48 business hours for a response.  Please remember that this is for non-urgent requests.  _______________________________________________________

## 2021-10-31 NOTE — Telephone Encounter (Signed)
-----   Message from Levin Erp, [place] sent at 10/31/2021  2:19 PM EDT ----- Regarding: iron infusion Can you see about arranging this patient for iron infusion?  Thanks-JLL

## 2021-10-31 NOTE — Progress Notes (Signed)
Chief Complaint: Follow-up hospitalization for anemia  HPI:    [name] is a 71 year old female, known to Dr. Henrene Pastor, with a past medical history of obesity, cervical cancer, GERD, colon polyps, IDA, gastroparesis, stroke, chronic opiate pain management and multiple others, who presents to clinic today for follow-up after recent hospitalization for anemia.    08/29/2021 patient consulted our service for anemia and hemoglobin of 5.3 as well as constipation.  At that time recommended dose of Relistor x1 and consider future Linzess, smog enema.    08/31/2021 colonoscopy with hemorrhoids, few ulcers in the distal rectum, diverticulosis in the sigmoid and descending colon, two 2-4 mm polyps in the rectum and sigmoid colon and otherwise normal.  Patient told to continue aggressive bowel regiment to avoid constipation to include Relistor given chronic narcotics.  Consider Carafate suppositories for 6 weeks for local therapy of rectal ulcers.  Repeat colonoscopy pending path.  EGD with normal esophagus, with erythematous mucosa in the gastric body and otherwise normal.    09/01/2021 patient discharged from the hospital.  Recommended aggressive bowel regimen, unfortunately Relistor was cost prohibitive.  Recommended Motegrity 2 mg daily or alternatively Linzess or Amitiza.  Also outpatient referral for pelvic floor PT.    09/30/2021-10/03/2021 patient admitted to the hospital for sepsis with shortness of breath.    10/02/2021 BMP normal.  CBC revealed hemoglobin of 7.7 (around baseline).    Today, the patient presents to clinic and tells me that she had to go back to the hospital for a lung infection after we saw her.  She feels like she is doing well in general other than still some shortness of breath.  Tells me she was supposed to be arranged for an iron infusion but has not been able to get an appointment through the cancer center.  Also unaware if she actually did a pill capsule endoscopy or not (I cannot  see results in our system).  Tells me when on Linzess 290 mcg daily she was doing well towards the end, initially gave her some diarrhea but then it worked itself out and she was having 1 soft solid stool a day.  She would like to continue on this.    Denies fever, chills, weight loss or blood in her stool.  Past Medical History:  Diagnosis Date   Anxiety    Aortic atherosclerosis (HCC)    Bilateral cataracts    Carpal tunnel syndrome    Cervical cancer (HCC)    Chronic back pain    Claudication (HCC)    COPD (chronic obstructive pulmonary disease) (HCC)    wheezing   DDD (degenerative disc disease), cervical    DDD (degenerative disc disease), lumbar    Depression    Diverticulosis    Gastroparesis    GERD (gastroesophageal reflux disease)    Hearing loss    Bilateral   History of blood transfusion    History of colon polyps    History of migraine    Hypertension    Internal hemorrhoids    Iron deficiency anemia    Left-sided weakness    Leg swelling    Liver disease    pt unaware   Lung nodule    several small   OA (osteoarthritis)    both hands   Positive ANA (antinuclear antibody)    Pre-diabetes    Raynaud's phenomenon    Restless leg    Seizures ([place])    no meds for 8 months   Sleep apnea  does not use her cpap   Spondylosis    Stroke ([place]) 2015   no weakness   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   rt   Urge incontinence of urine    Varicose vein of leg    Venous insufficiency     Past Surgical History:  Procedure Laterality Date   BACK SURGERY     BIOPSY  08/31/2021   Procedure: BIOPSY;  Surgeon: Thornton Park, MD;  Location: [facility] ENDOSCOPY;  Service: Gastroenterology;;   BLEPHAROPLASTY  10/10/2017   CATARACT EXTRACTION, BILATERAL     CESAREAN SECTION     CHOLECYSTECTOMY     COLONOSCOPY  10/2016   COLONOSCOPY WITH PROPOFOL N/A 08/31/2021   Procedure: COLONOSCOPY WITH PROPOFOL;  Surgeon: Thornton Park, MD;  Location: [place];  Service:  Gastroenterology;  Laterality: N/A;   ESOPHAGOGASTRODUODENOSCOPY (EGD) WITH PROPOFOL N/A 08/31/2021   Procedure: ESOPHAGOGASTRODUODENOSCOPY (EGD) WITH PROPOFOL;  Surgeon: Thornton Park, MD;  Location: [place];  Service: Gastroenterology;  Laterality: N/A;   HAND SURGERY Bilateral    carpal tunnel   HEMORRHOID SURGERY     INTERSTIM IMPLANT PLACEMENT     KNEE SURGERY Left    arthroscopy x3   LOWER EXTREMITY ANGIOGRAPHY N/A 07/16/2016   Procedure: Lower Extremity Angiography;  Surgeon: Adrian Prows, MD;  Location: [place] CV LAB;  Service: Cardiovascular;  Laterality: N/A;   LOWER EXTREMITY INTERVENTION N/A 08/06/2016   Procedure: Lower Extremity Intervention;  Surgeon: Adrian Prows, MD;  Location: [place] [facility];  Service: Cardiovascular;  Laterality: N/A;   NECK SURGERY     OTHER SURGICAL HISTORY  2013   bladder stimulator in back   PERIPHERAL VASCULAR BALLOON ANGIOPLASTY  08/06/2016   Procedure: Peripheral Vascular Balloon Angioplasty;  Surgeon: Adrian Prows, MD;  Location: [place] [facility];  Service: Cardiovascular;;  Right SFA   POLYPECTOMY  08/31/2021   Procedure: POLYPECTOMY;  Surgeon: Thornton Park, MD;  Location: [facility] ENDOSCOPY;  Service: Gastroenterology;;   TENNIS ELBOW RELEASE/NIRSCHEL PROCEDURE Right 12/2015   TOE SURGERY Right    right foot second and fourth toe   TOTAL HIP ARTHROPLASTY Left    TOTAL HIP REVISION Left 10/13/2017   Procedure: LEFT TOTAL HIP REVISION ACETABULUM, OPEN REDUCTION INTERNAL WITH BONE GRAFT FIXATION LEFT GREATER TROCHANTERIC FRACTURE;  Surgeon: Frederik Pear, MD;  Location: [facility];  Service: Orthopedics;  Laterality: Left;   TOTAL KNEE ARTHROPLASTY Right 01/12/2016   Procedure: RIGHT TOTAL KNEE ARTHROPLASTY;  Surgeon: Netta Cedars, MD;  Location: [place];  Service: Orthopedics;  Laterality: Right;   TUBAL LIGATION     UPPER GI ENDOSCOPY  10/2016   VAGINAL HYSTERECTOMY      Current Outpatient Medications  Medication Sig Dispense Refill    acetaminophen (TYLENOL) 325 MG tablet Take 650 mg by mouth 2 (two) times daily as needed for headache.     albuterol (PROVENTIL) (2.5 MG/3ML) 0.083% nebulizer solution Inhale 3 mLs into the lungs every 4 (four) hours as needed for wheezing or shortness of breath (cough). (Patient taking differently: Inhale 3 mLs into the lungs 2 (two) times daily as needed for wheezing or shortness of breath (cough).) 75 mL 12   albuterol (VENTOLIN HFA) 108 (90 Base) MCG/ACT inhaler Inhale 2 puffs into the lungs 2 (two) times daily as needed for wheezing or shortness of breath.     aspirin EC 81 MG tablet Take 1 tablet (81 mg total) by mouth daily. Swallow whole. 150 tablet 2   benzonatate (TESSALON) 100 MG capsule Take 1  capsule (100 mg total) by mouth 2 (two) times daily as needed for cough. 20 capsule 0   bisacodyl (FLEET) 10 MG/30ML ENEM Place 10 mg rectally once as needed (constipation).     busPIRone (BUSPAR) 5 MG tablet Take 1 tablet (5 mg total) by mouth 2 (two) times daily. (Patient taking differently: Take 5 mg by mouth.) 60 tablet 0   Cholecalciferol (VITAMIN D-3) 25 MCG (1000 UT) CAPS Take 2,000 Units by mouth daily.     cyanocobalamin (VITAMIN B12) 1000 MCG/ML injection Inject 1,000 mcg into the muscle every 30 (thirty) days.     docusate sodium (COLACE) 100 MG capsule Take 200 mg by mouth daily as needed (constipation).     doxycycline (DORYX) 100 MG EC tablet Take 100 mg by mouth 2 (two) times daily.     DULoxetine (CYMBALTA) 60 MG capsule Take 60 mg by mouth at bedtime.     ferrous sulfate 324 MG TBEC Take 324 mg by mouth.     hydrocortisone (ANUSOL-HC) 25 MG suppository Place 1 suppository (25 mg total) rectally 2 (two) times daily as needed for hemorrhoids or anal itching. 12 suppository 0   levofloxacin (LEVAQUIN) 500 MG tablet Take 1 tablet (500 mg total) by mouth daily. 7 tablet 0   linaclotide (LINZESS) 290 MCG CAPS capsule Take 1 capsule (290 mcg total) by mouth daily before breakfast. 30  capsule 1   losartan (COZAAR) 25 MG tablet Take 25 mg by mouth daily.     methocarbamol (ROBAXIN) 500 MG tablet Take 1 tablet (500 mg total) by mouth every 6 (six) hours as needed for muscle spasms. (Patient taking differently: Take 500 mg by mouth at bedtime.) 60 tablet 0   metoCLOPramide (REGLAN) 5 MG tablet Take 1 tablet by mouth 3 times daily 20-30 minutes before meals NO FURTHER REFILLS UNTIL SEEN, NEEDS AND APPOINTMENT (Patient taking differently: Take 5 mg by mouth daily before supper.) 90 tablet 0   nitroGLYCERIN (NITROSTAT) 0.4 MG SL tablet Place 0.4 mg under the tongue every 5 (five) minutes x 3 doses as needed for chest pain.  1   Oxycodone HCl 10 MG TABS Take 10 mg by mouth 5 (five) times daily as needed (for moderate pain).     pantoprazole (PROTONIX) 40 MG tablet Take 40 mg by mouth daily as needed (acid reflux).     rOPINIRole (REQUIP) 1 MG tablet Take 1 mg by mouth 2 (two) times daily.     rosuvastatin (CRESTOR) 10 MG tablet Take 10 mg by mouth at bedtime.     senna (SENOKOT) 8.6 MG tablet Take 17.2 mg by mouth daily as needed for constipation.     traZODone (DESYREL) 50 MG tablet TAKE 0.5-1 TABLETS (25-50 MG TOTAL) BY MOUTH AT BEDTIME AS NEEDED FOR SLEEP. (Patient taking differently: Take 25 mg by mouth at bedtime.) 90 tablet 1   No current facility-administered medications for this visit.    Allergies as of 10/31/2021 - Review Complete 10/22/2021  Allergen Reaction Noted   Bee venom Anaphylaxis 12/28/2015   Morphine and related Other (See Comments) 07/12/2016   Neurontin [gabapentin] Other (See Comments) 10/14/2017   Codeine Hives 02/20/2017   Adhesive [tape] Itching and Other (See Comments) 08/02/2016   Sulfa antibiotics Other (See Comments) 09/02/2014   Sulfonamide derivatives Other (See Comments) 02/20/2017    Family History  Problem Relation Age of Onset   Hyperlipidemia Mother    Hypertension Mother    Anxiety disorder Mother    Depression Mother  Hyperlipidemia Father    Hypertension Father    Alzheimer's disease Father    Anxiety disorder Brother    Cancer Daughter        unknown   Anxiety disorder Brother    Anxiety disorder Brother    Drug abuse Brother    Stroke Brother    Colon cancer Neg Hx    Esophageal cancer Neg Hx    Rectal cancer Neg Hx    Stomach cancer Neg Hx     Social History   Socioeconomic History   Marital status: Married    Spouse name: sammy   Number of children: 1   Years of education: Not on file   Highest education level: Associate degree: occupational, Hotel manager, or vocational program  Occupational History   Not on file  Tobacco Use   Smoking status: Every Day    Packs/day: 0.50    Years: 55.00    Total pack years: 27.50    Types: Cigarettes    Passive exposure: Past   Smokeless tobacco: Never   Tobacco comments:    Pt states she smokes a half pack daily  Vaping Use   Vaping Use: Never used  Substance and Sexual Activity   Alcohol use: No    Alcohol/week: 0.0 standard drinks of alcohol   Drug use: Not Currently   Sexual activity: Not Currently    Birth control/protection: Post-menopausal, Surgical    Comment: hyst  Other Topics Concern   Not on file  Social History Narrative   Not on file   Social Determinants of Health   Financial Resource Strain: Medium Risk (04/14/2020)   Overall Financial Resource Strain (CARDIA)    Difficulty of Paying Living Expenses: Somewhat hard  Food Insecurity: No Food Insecurity (04/14/2020)   Hunger Vital Sign    Worried About Running Out of Food in the Last Year: Never true    Ran Out of Food in the Last Year: Never true  Transportation Needs: No Transportation Needs (04/14/2020)   PRAPARE - Hydrologist (Medical): No    Lack of Transportation (Non-Medical): No  Physical Activity: Unknown (04/14/2020)   Exercise Vital Sign    Days of Exercise per Week: Not on file    Minutes of Exercise per Session: 30 min  Stress: No  Stress Concern Present (04/14/2020)   [place]    Feeling of Stress : Only a little  Social Connections: Moderately Integrated (04/14/2020)   Social Connection and Isolation Panel [NHANES]    Frequency of Communication with Friends and Family: More than three times a week    Frequency of Social Gatherings with Friends and Family: Once a week    Attends Religious Services: More than 4 times per year    Active Member of [employer] or Organizations: No    Attends Archivist Meetings: Never    Marital Status: Married  Human resources officer Violence: Not At Risk (04/14/2020)   Humiliation, Afraid, Rape, and Kick questionnaire    Fear of Current or Ex-Partner: No    Emotionally Abused: No    Physically Abused: No    Sexually Abused: No    Review of Systems:    Constitutional: No weight loss, fever or chills Cardiovascular: No chest pain Respiratory: +SOB Gastrointestinal: See HPI and otherwise negative   Physical Exam:  Vital signs: BP 126/60   Pulse 100   Ht 5\' 1"  (1.549 m)   Wt 195 lb  3.2 oz (88.5 kg)   BMI 36.88 kg/m    Constitutional:   Pleasant Elderly Caucasian female appears to be in NAD, Well developed, Well nourished, alert and cooperative Respiratory: Respirations even and unlabored. Lungs clear to auscultation bilaterally.   No wheezes, crackles, or rhonchi.  Cardiovascular: Normal S1, S2. No MRG. Regular rate and rhythm. No peripheral edema, cyanosis or pallor.  Gastrointestinal:  Soft, nondistended, nontender. No rebound or guarding. Normal bowel sounds. No appreciable masses or hepatomegaly. Psychiatric: Oriented to person, place and time. Demonstrates good judgement and reason without abnormal affect or behaviors.  RELEVANT LABS AND IMAGING: CBC    Component Value Date/Time   WBC 6.4 10/03/2021 0150   RBC 3.95 10/03/2021 0150   HGB 8.1 (L) 10/03/2021 0150   HGB 14.1 03/08/2020 1054   HCT 28.2 (L)  10/03/2021 0150   HCT 42.4 03/08/2020 1054   PLT 235 10/03/2021 0150   PLT 289 03/08/2020 1054   MCV 71.4 (L) 10/03/2021 0150   MCV 84 03/08/2020 1054   MCH 20.5 (L) 10/03/2021 0150   MCHC 28.7 (L) 10/03/2021 0150   RDW 22.0 (H) 10/03/2021 0150   RDW 14.3 03/08/2020 1054   LYMPHSABS 1.4 10/01/2021 0417   MONOABS 1.1 (H) 10/01/2021 0417   EOSABS 0.1 10/01/2021 0417   BASOSABS 0.0 10/01/2021 0417    CMP     Component Value Date/Time   NA 135 10/02/2021 0121   NA 140 09/28/2019 0948   K 3.7 10/02/2021 0121   K 3.5 03/19/2011 0722   CL 105 10/02/2021 0121   CO2 24 10/02/2021 0121   GLUCOSE 101 (H) 10/02/2021 0121   BUN 12 10/02/2021 0121   BUN 12 09/28/2019 0948   CREATININE 0.71 10/02/2021 0121   CALCIUM 8.8 (L) 10/02/2021 0121   PROT 7.4 09/30/2021 1532   PROT 7.0 09/28/2019 0948   ALBUMIN 3.4 (L) 09/30/2021 1532   ALBUMIN 4.2 09/28/2019 0948   AST 19 09/30/2021 1532   ALT 13 09/30/2021 1532   ALKPHOS 110 09/30/2021 1532   BILITOT 0.2 (L) 09/30/2021 1532   BILITOT <0.2 09/28/2019 0948   GFRNONAA >60 10/02/2021 0121   GFRAA 89 09/28/2019 0948    Assessment: 1.  Iron deficiency anemia: Recent hospitalization with work-up including EGD and colonoscopy and (?  Pill capsule endoscopy), will recheck labs today 2.  Constipation: Related to chronic pain meds, was doing well on Linzess 290 mcg daily until she ran out  Plan: 1.  I will inquire about pill capsule endoscopy to see if we can get results posted to the chart.  If this is not actually done in the hospital then she will need to have 1 done. 2.  I will contact my nurse to see if we can get her arranged for an iron infusion. 3.  Patient tells me pulmonology is drawing labs today, went ahead and added a CBC and iron studies with ferritin to this order. 4.  Refilled Linzess 290 mcg daily #30 with 11 refills 5.  Patient to follow in clinic per recommendations after labs above.  [name], PA-C [facility] 10/31/2021, 1:34 PM  Cc: Alvester Chou, NP

## 2021-10-31 NOTE — Telephone Encounter (Signed)
Order for Feraheme x 2 doses 1 week apart in epic. [facility] on [address] will contact patient to schedule appts.

## 2021-10-31 NOTE — Progress Notes (Signed)
PA assessment and plans noted

## 2021-10-31 NOTE — Addendum Note (Signed)
Addended by: Yevette Edwards on: 10/31/2021 04:27 PM   Modules accepted: Orders

## 2021-11-01 ENCOUNTER — Telehealth: Payer: Self-pay | Admitting: Pharmacy Technician

## 2021-11-01 NOTE — Telephone Encounter (Signed)
Spoke with pt and the labs were drawn that [name] ordered.

## 2021-11-01 NOTE — Telephone Encounter (Signed)
Auth Submission: NO AUTH NEEDED Payer: [employer] MEDICARE Medication & CPT/J Code(s) submitted: Feraheme (ferumoxytol) [id number] Route of submission (phone, fax, portal):  Phone # Fax # Auth type: Buy/Bill Units/visits requested: X2 Reference number: [id number] Approval from: 11/01/21 to 01/13/22

## 2021-11-05 ENCOUNTER — Ambulatory Visit (INDEPENDENT_AMBULATORY_CARE_PROVIDER_SITE_OTHER): Payer: Medicare Other

## 2021-11-05 VITALS — BP 147/71 | HR 98 | Temp 98.0°F | Resp 18 | Ht 61.0 in | Wt 193.4 lb

## 2021-11-05 DIAGNOSIS — D509 Iron deficiency anemia, unspecified: Secondary | ICD-10-CM

## 2021-11-05 MED ORDER — ACETAMINOPHEN 325 MG PO TABS
650.0000 mg | ORAL_TABLET | Freq: Once | ORAL | Status: AC
  Administered 2021-11-05: 650 mg via ORAL
  Filled 2021-11-05: qty 2

## 2021-11-05 MED ORDER — SODIUM CHLORIDE 0.9 % IV SOLN
510.0000 mg | Freq: Once | INTRAVENOUS | Status: AC
  Administered 2021-11-05: 510 mg via INTRAVENOUS
  Filled 2021-11-05: qty 17

## 2021-11-05 MED ORDER — DIPHENHYDRAMINE HCL 25 MG PO CAPS
25.0000 mg | ORAL_CAPSULE | Freq: Once | ORAL | Status: AC
  Administered 2021-11-05: 25 mg via ORAL
  Filled 2021-11-05: qty 1

## 2021-11-05 NOTE — Progress Notes (Addendum)
Diagnosis: Iron Deficiency Anemia  Provider:  Marshell Garfinkel MD  Procedure: Infusion  IV Type: Peripheral, IV Location: L Antecubital  Monoferric (Ferric Derisomaltose), Dose: 510 mg  Infusion Start Time: 1222  Infusion Stop Time: 1240  Post Infusion IV Care: Observation period completed and Peripheral IV Discontinued  Discharge: Condition: Good, Destination: Home . AVS provided to patient.   Performed by:  Arnoldo Morale, RN

## 2021-11-08 ENCOUNTER — Ambulatory Visit (HOSPITAL_COMMUNITY)
Admission: RE | Admit: 2021-11-08 | Discharge: 2021-11-08 | Disposition: A | Discharge status: Home / Self Care | Payer: Medicare Other | Source: Ambulatory Visit | Attending: Pulmonary Disease | Admitting: Pulmonary Disease

## 2021-11-08 ENCOUNTER — Telehealth: Payer: Self-pay | Admitting: Pulmonary Disease

## 2021-11-08 ENCOUNTER — Ambulatory Visit (HOSPITAL_COMMUNITY): Payer: Medicare Other

## 2021-11-08 DIAGNOSIS — J189 Pneumonia, unspecified organism: Secondary | ICD-10-CM | POA: Diagnosis present

## 2021-11-08 MED ORDER — IOHEXOL 350 MG/ML SOLN
50.0000 mL | Freq: Once | INTRAVENOUS | Status: AC | PRN
  Administered 2021-11-08: 50 mL via INTRAVENOUS

## 2021-11-08 NOTE — Telephone Encounter (Signed)
Katie NP Has 2 openings tomorrow   Needs ov , can set up virtual .   Supportive care, fluids , rest , tylenol  Covid test call if positive   Please contact office for sooner follow up if symptoms do not improve or worsen or seek emergency care

## 2021-11-08 NOTE — Telephone Encounter (Signed)
I called and spoke with the pt  She is c/o cough with green/yellow sputum, chills, wheezing, increased SOB x 5 days  She started to have fever yesterday- 103  She checked now while I am on the phone with her and it was 99.3  Has not taken tylenol since yesterday  I advised take covid test  She will do this and call us back  Tammy, we do not have any openings  What do you rec?  Allergies  Allergen Reactions   Bee Venom Anaphylaxis   Morphine And Related Other (See Comments)    Overly sedated   Neurontin [Gabapentin] Other (See Comments)    Nightmares    Codeine Hives   Adhesive [Tape] Itching and Other (See Comments)    Redness, Paper tape is ok   Sulfa Antibiotics Other (See Comments)    Childhood allergy Unknown reaction   Sulfonamide Derivatives Other (See Comments)    Childhood allergy Unknown reaction

## 2021-11-08 NOTE — Telephone Encounter (Signed)
Spoke with the pt  I scheduled her with TP for tomorrow at 9:30 am  Her test was inconclusive  She was advised to take another and call back in the am if pos  She is not able to do a video visit

## 2021-11-09 ENCOUNTER — Encounter: Payer: Self-pay | Admitting: Adult Health

## 2021-11-09 ENCOUNTER — Ambulatory Visit: Payer: Medicare Other | Admitting: Adult Health

## 2021-11-09 DIAGNOSIS — G4733 Obstructive sleep apnea (adult) (pediatric): Secondary | ICD-10-CM

## 2021-11-09 DIAGNOSIS — F17210 Nicotine dependence, cigarettes, uncomplicated: Secondary | ICD-10-CM

## 2021-11-09 DIAGNOSIS — J441 Chronic obstructive pulmonary disease with (acute) exacerbation: Secondary | ICD-10-CM | POA: Diagnosis not present

## 2021-11-09 DIAGNOSIS — M51369 Other intervertebral disc degeneration, lumbar region without mention of lumbar back pain or lower extremity pain: Secondary | ICD-10-CM

## 2021-11-09 DIAGNOSIS — J189 Pneumonia, unspecified organism: Secondary | ICD-10-CM

## 2021-11-09 DIAGNOSIS — M5136 Other intervertebral disc degeneration, lumbar region: Secondary | ICD-10-CM

## 2021-11-09 MED ORDER — AMOXICILLIN-POT CLAVULANATE 875-125 MG PO TABS: 1.0000 | ORAL_TABLET | Freq: Two times a day (BID) | ORAL | 0 refills | Status: DC

## 2021-11-09 MED ORDER — STIOLTO RESPIMAT 2.5-2.5 MCG/ACT IN AERS: 2.0000 | INHALATION_SPRAY | Freq: Every day | RESPIRATORY_TRACT | 0 refills | Status: DC

## 2021-11-09 MED ORDER — PREDNISONE 20 MG PO TABS: 20.0000 mg | ORAL_TABLET | Freq: Every day | ORAL | 0 refills | Status: DC

## 2021-11-09 MED ORDER — STIOLTO RESPIMAT 2.5-2.5 MCG/ACT IN AERS: 2.0000 | INHALATION_SPRAY | Freq: Every day | RESPIRATORY_TRACT | 5 refills | Status: DC

## 2021-11-09 NOTE — Assessment & Plan Note (Signed)
Smoking cessation  

## 2021-11-09 NOTE — Assessment & Plan Note (Signed)
Ongoing lingering symptoms with persistent RLL consolidation- check sputum culture , with fever, discolored mucus will begin empiric antibiotics . High risk for underlying malignancy due to ongoing smoking, also ?aspiration with chronic narcotic use / polypharmacy with multiple sedating medications.  Follow up on CT chest , may need Bronchoscopy with BAL /cytology if symptoms persist  Plan  Patient Instructions  Sputum culture  Augmentin 813m Twice daily  for 10 days , take with food.  Prednisone 266mdaily for 5 days  Mucinex DM Twice daily  As needed  cough/congestion  Begin Stiolto 2 puffs daily  Set up for Split night study .  Work on not smoking  Will call with CT results  Follow up in 1 week and As needed   Please contact office for sooner follow up if symptoms do not improve or worsen or seek emergency care

## 2021-11-09 NOTE — Assessment & Plan Note (Signed)
Chronic pain syndrome on multiple sedating medications.  Long discussion with patient regarding daily medication use and potential complications.  Would proceed with caution.

## 2021-11-09 NOTE — Assessment & Plan Note (Signed)
Ongoing COPD exacerbation.  Previous PFTs showed no significant airflow obstruction may have slow to resolve chronic asthmatic bronchitic exacerbation.  Going for need to repeat PFTs. Encouraged on smoking cessation.  Will give short course of low-dose steroids.  Begin LABA LAMA combo.  Plan  Patient Instructions  Sputum culture  Augmentin 875mg  Twice daily  for 10 days , take with food.  Prednisone 20mg  daily for 5 days  Mucinex DM Twice daily  As needed  cough/congestion  Begin Stiolto 2 puffs daily  Set up for Split night study .  Work on not smoking  Will call with CT results  Follow up in 1 week and As needed   Please contact office for sooner follow up if symptoms do not improve or worsen or seek emergency care

## 2021-11-09 NOTE — H&P (View-Only) (Signed)
@Patient  ID: [name], female    DOB: Jun 13, 1950, 71 y.o.   MRN: [id number]  Chief Complaint  Patient presents with   Acute Visit    Referring provider: Alvester Chou, NP  HPI: 71 year old female active smoker followed for COPD Medical history significant for iron deficiency anemia, stroke, peripheral artery disease  TEST/EVENTS :  Hospitalization September 2023 for severe sepsis, community-acquired pneumonia, COPD exacerbation  11/09/2021 Follow up : COPD, CAP  Patient presents for an acute office visit.  Patient was recently seen in the office earlier this month for a posthospital follow-up.  She had been admitted in September for severe sepsis, community-acquired manage and COPD exacerbation.  She was treated with IV antibiotics steroids and nebulized bronchodilators.  Weaned off of oxygen prior to discharge.  Discharged on oral antibiotics and steroids. Patient continues to have ongoing symptoms with cough shortness of breath and wheezing.  Says that her cough has been worse over the last 5 days now coughing up green-yellow mucus.  Fevers up to 99.3. has seen some blood tinged mucus . No chest pain, orthopnea , edema or calf pain  Has history of OSA not on CPAP . Unsure when/where sleep test was done years ago. Has loud snoring , restless sleep and daytime sleepiness.  Has chronic back pain .  Has oxycodone 10 mg 5 times daily, robaxin, buspar, trazodone.  Husband is sick at home with same symptoms . They tested for covid at home , both were negative .  CT chest done yesterday pending .    Allergies  Allergen Reactions   Bee Venom Anaphylaxis   Morphine And Related Other (See Comments)    Overly sedated   Neurontin [Gabapentin] Other (See Comments)    Nightmares    Codeine Hives   Adhesive [Tape] Itching and Other (See Comments)    Redness, Paper tape is ok   Sulfa Antibiotics Other (See Comments)    Childhood allergy Unknown reaction   Sulfonamide Derivatives  Other (See Comments)    Childhood allergy Unknown reaction    There is no immunization history for the selected administration types on file for this patient.  Past Medical History:  Diagnosis Date   Anxiety    Aortic atherosclerosis (HCC)    Bilateral cataracts    Carpal tunnel syndrome    Cervical cancer (HCC)    Chronic back pain    Claudication (HCC)    COPD (chronic obstructive pulmonary disease) (HCC)    wheezing   DDD (degenerative disc disease), cervical    DDD (degenerative disc disease), lumbar    Depression    Diverticulosis    Gastroparesis    GERD (gastroesophageal reflux disease)    Hearing loss    Bilateral   History of blood transfusion    History of colon polyps    History of migraine    Hypertension    Internal hemorrhoids    Iron deficiency anemia    Left-sided weakness    Leg swelling    Liver disease    pt unaware   Lung nodule    several small   OA (osteoarthritis)    both hands   Positive ANA (antinuclear antibody)    Pre-diabetes    Raynaud's phenomenon    Restless leg    Seizures (HCC)    no meds for 8 months   Sleep apnea    does not use her cpap   Spondylosis    Stroke ([place]) 2015   no weakness  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   rt   Urge incontinence of urine    Varicose vein of leg    Venous insufficiency     Tobacco History: Social History   Tobacco Use  Smoking Status Every Day   Packs/day: 0.50   Years: 55.00   Total pack years: 27.50   Types: Cigarettes   Passive exposure: Past  Smokeless Tobacco Never  Tobacco Comments   Pt states she smokes a half pack daily   Ready to quit: Not Answered Counseling given: Not Answered Tobacco comments: Pt states she smokes a half pack daily   Outpatient Medications Prior to Visit  Medication Sig Dispense Refill   acetaminophen (TYLENOL) 325 MG tablet Take 650 mg by mouth 2 (two) times daily as needed for headache.     albuterol (PROVENTIL) (2.5 MG/3ML) 0.083% nebulizer  solution Inhale 3 mLs into the lungs every 4 (four) hours as needed for wheezing or shortness of breath (cough). (Patient taking differently: Inhale 3 mLs into the lungs 2 (two) times daily as needed for wheezing or shortness of breath (cough).) 75 mL 12   albuterol (VENTOLIN HFA) 108 (90 Base) MCG/ACT inhaler Inhale 2 puffs into the lungs 2 (two) times daily as needed for wheezing or shortness of breath.     aspirin EC 81 MG tablet Take 1 tablet (81 mg total) by mouth daily. Swallow whole. 150 tablet 2   benzonatate (TESSALON) 100 MG capsule Take 1 capsule (100 mg total) by mouth 2 (two) times daily as needed for cough. 20 capsule 0   bisacodyl (FLEET) 10 MG/30ML ENEM Place 10 mg rectally once as needed (constipation).     busPIRone (BUSPAR) 5 MG tablet Take 1 tablet (5 mg total) by mouth 2 (two) times daily. (Patient taking differently: Take 5 mg by mouth.) 60 tablet 0   Cholecalciferol (VITAMIN D-3) 25 MCG (1000 UT) CAPS Take 2,000 Units by mouth daily.     cyanocobalamin (VITAMIN B12) 1000 MCG/ML injection Inject 1,000 mcg into the muscle every 30 (thirty) days.     docusate sodium (COLACE) 100 MG capsule Take 200 mg by mouth daily as needed (constipation).     DULoxetine (CYMBALTA) 60 MG capsule Take 60 mg by mouth at bedtime.     ferrous sulfate 324 MG TBEC Take 324 mg by mouth.     hydrocortisone (ANUSOL-HC) 25 MG suppository Place 1 suppository (25 mg total) rectally 2 (two) times daily as needed for hemorrhoids or anal itching. 12 suppository 0   linaclotide (LINZESS) 290 MCG CAPS capsule Take 1 capsule (290 mcg total) by mouth daily before breakfast. 30 capsule 1   losartan (COZAAR) 25 MG tablet Take 25 mg by mouth daily.     metoCLOPramide (REGLAN) 5 MG tablet Take 1 tablet by mouth 3 times daily 20-30 minutes before meals NO FURTHER REFILLS UNTIL SEEN, NEEDS AND APPOINTMENT (Patient taking differently: Take 5 mg by mouth daily before supper.) 90 tablet 0   nitroGLYCERIN (NITROSTAT) 0.4 MG  SL tablet Place 0.4 mg under the tongue every 5 (five) minutes x 3 doses as needed for chest pain.  1   Oxycodone HCl 10 MG TABS Take 10 mg by mouth 5 (five) times daily as needed (for moderate pain).     pantoprazole (PROTONIX) 40 MG tablet Take 40 mg by mouth daily as needed (acid reflux).     rOPINIRole (REQUIP) 1 MG tablet Take 1 mg by mouth 2 (two) times daily.     rosuvastatin (  CRESTOR) 10 MG tablet Take 10 mg by mouth at bedtime.     senna (SENOKOT) 8.6 MG tablet Take 17.2 mg by mouth daily as needed for constipation.     traZODone (DESYREL) 50 MG tablet TAKE 0.5-1 TABLETS (25-50 MG TOTAL) BY MOUTH AT BEDTIME AS NEEDED FOR SLEEP. (Patient taking differently: Take 25 mg by mouth at bedtime.) 90 tablet 1   methocarbamol (ROBAXIN) 500 MG tablet Take 1 tablet (500 mg total) by mouth every 6 (six) hours as needed for muscle spasms. (Patient not taking: Reported on 11/09/2021) 60 tablet 0   No facility-administered medications prior to visit.     Review of Systems:   Constitutional:   No  weight loss, night sweats,  Fevers, chills,  +fatigue, or  lassitude.  HEENT:   No headaches,  Difficulty swallowing,  Tooth/dental problems, or  Sore throat,                No sneezing, itching, ear ache, nasal congestion, post nasal drip,   CV:  No chest pain,  Orthopnea, PND, swelling in lower extremities, anasarca, dizziness, palpitations, syncope.   GI  No heartburn, indigestion, abdominal pain, nausea, vomiting, diarrhea, change in bowel habits, loss of appetite, bloody stools.   Resp:   No chest wall deformity  Skin: no rash or lesions.  GU: no dysuria, change in color of urine, no urgency or frequency.  No flank pain, no hematuria   MS:  + back pain.    Physical Exam  BP (!) 164/76 (BP Location: Left Arm, Patient Position: Sitting, Cuff Size: Large)   Pulse 100   Temp 97.9 F (36.6 C) (Oral)   Ht 5\' 1"  (1.549 m)   Wt 193 lb 12.8 oz (87.9 kg)   SpO2 97%   BMI 36.62 kg/m   GEN:  A/Ox3; pleasant , NAD, well nourished , walks with cane   HEENT:  [place]/AT,   NOSE-clear, THROAT-clear, no lesions, no postnasal drip or exudate noted.Class 3-4 MP airway   NECK:  Supple w/ fair ROM; no JVD; normal carotid impulses w/o bruits; no thyromegaly or nodules palpated; no lymphadenopathy.    RESP  coarse rhonchi bilaterally , speaks in full sentences . no accessory muscle use, no dullness to percussion  CARD:  RRR, no m/r/g, no peripheral edema, pulses intact, no cyanosis or clubbing.  GI:   Soft & nt; nml bowel sounds; no organomegaly or masses detected.   Musco: Warm bil, no deformities or joint swelling noted.   Neuro: alert, no focal deficits noted.    Skin: Warm, no lesions or rashes    Lab Results:  CBC    Component Value Date/Time   WBC 5.3 10/31/2021 1511   RBC 4.05 10/31/2021 1511   HGB 8.1 Repeated and verified X2. (L) 10/31/2021 1511   HGB 14.1 03/08/2020 1054   HCT 26.2 (L) 10/31/2021 1511   HCT 42.4 03/08/2020 1054   PLT 252.0 10/31/2021 1511   PLT 289 03/08/2020 1054   MCV 64.6 Repeated and verified X2. (L) 10/31/2021 1511   MCV 84 03/08/2020 1054   MCH 20.5 (L) 10/03/2021 0150   MCHC 30.8 10/31/2021 1511   RDW 22.8 (H) 10/31/2021 1511   RDW 14.3 03/08/2020 1054   LYMPHSABS 1.0 10/31/2021 1511   MONOABS 0.4 10/31/2021 1511   EOSABS 0.5 10/31/2021 1511   BASOSABS 0.0 10/31/2021 1511    BMET    Component Value Date/Time   NA 135 10/31/2021 1511   NA 140  09/28/2019 0948   K 4.0 10/31/2021 1511   K 3.5 03/19/2011 0722   CL 99 10/31/2021 1511   CO2 28 10/31/2021 1511   GLUCOSE 104 (H) 10/31/2021 1511   BUN 9 10/31/2021 1511   BUN 12 09/28/2019 0948   CREATININE 0.66 10/31/2021 1511   CALCIUM 8.6 10/31/2021 1511   GFRNONAA >60 10/02/2021 0121   GFRAA 89 09/28/2019 0948    BNP    Component Value Date/Time   BNP 31.1 11/10/2014 1212    ProBNP    Component Value Date/Time   PROBNP 42.7 07/31/2011 1105    Imaging: DG Chest 2  View  Result Date: 10/22/2021 CLINICAL DATA:  Patient with cough and chest pain. EXAM: CHEST - 2 VIEW COMPARISON:  Chest radiograph September 30, 2021 FINDINGS: Stable cardiac and mediastinal contours. Persistent right hilar prominence. Similar-appearing to mildly improved nodular area of consolidation right mid lung. No pleural effusion or pneumothorax. Thoracic spine degenerative changes. IMPRESSION: Persistent right hilar prominence with similar to mildly improved nodular area of consolidation right mid lung. Findings may represent persistent infectious process however underlying mass/right hilar adenopathy not excluded. Recommend further evaluation with contrast-enhanced chest CT to exclude underlying mass. These results will be called to the ordering clinician or representative by the Radiologist Assistant, and communication documented in the PACS or [employer]. Electronically Signed   By: Lovey Newcomer M.D.   On: 10/22/2021 12:12    acetaminophen (TYLENOL) tablet 650 mg     Date Action Dose Route User   11/05/2021 1148 Given 650 mg Oral Ranabhat, Sabitri, RN      diphenhydrAMINE (BENADRYL) capsule 25 mg     Date Action Dose Route User   11/05/2021 1148 Given 25 mg Oral Ranabhat, Sabitri, RN      ferumoxytol [facility]) 510 mg in sodium chloride 0.9 % 100 mL IVPB     Date Action Dose Route User   11/05/2021 1222 New Bag/Given 510 mg Intravenous Adelina Mings, LPN          Latest Ref Rng & Units 02/06/2018   10:51 AM  PFT Results  FVC-Pre L 2.21   FVC-Predicted Pre % 79   FVC-Post L 2.32   FVC-Predicted Post % 84   Pre FEV1/FVC % % 82   Post FEV1/FCV % % 85   FEV1-Pre L 1.81   FEV1-Predicted Pre % 86   FEV1-Post L 1.97   DLCO uncorrected ml/min/mmHg 12.51   DLCO UNC% % 59   DLVA Predicted % 77   TLC L 4.13   TLC % Predicted % 88   RV % Predicted % 94     No results found for: "NITRICOXIDE"      Assessment & Plan:   No problem-specific Assessment & Plan  notes found for this encounter.     Rexene Edison, NP 11/09/2021

## 2021-11-09 NOTE — Patient Instructions (Addendum)
Sputum culture  Augmentin 875mg  Twice daily  for 10 days , take with food.  Prednisone 20mg  daily for 5 days  Mucinex DM Twice daily  As needed  cough/congestion  Begin Stiolto 2 puffs daily  Set up for Split night study .  Work on not smoking  Will call with CT results  Follow up in 1 week and As needed   Please contact office for sooner follow up if symptoms do not improve or worsen or seek emergency care

## 2021-11-09 NOTE — Assessment & Plan Note (Signed)
Carries diagnosis of previous sleep apnea.  Sleep study is unavailable.  She has ongoing high symptom burden with snoring, daytime sleepiness and restless sleep.  We will set up for an in lab sleep study.  She has a history of stroke.  Plan  Patient Instructions  Sputum culture  Augmentin 875mg  Twice daily  for 10 days , take with food.  Prednisone 20mg  daily for 5 days  Mucinex DM Twice daily  As needed  cough/congestion  Begin Stiolto 2 puffs daily  Set up for Split night study .  Work on not smoking  Will call with CT results  Follow up in 1 week and As needed   Please contact office for sooner follow up if symptoms do not improve or worsen or seek emergency care

## 2021-11-09 NOTE — Progress Notes (Unsigned)
@Patient  ID: [name], female    DOB: 07-20-1950, 71 y.o.   MRN: [id number]  Chief Complaint  Patient presents with   Acute Visit    Referring provider: Alvester Chou, NP  HPI: 71 year old female active smoker followed for COPD Medical history significant for iron deficiency anemia, stroke, peripheral artery disease  TEST/EVENTS :  Hospitalization September 2023 for severe sepsis, community-acquired pneumonia, COPD exacerbation  11/09/2021 Follow up : COPD, CAP  Patient presents for an acute office visit.  Patient was recently seen in the office earlier this month for a posthospital follow-up.  She had been admitted in September for severe sepsis, community-acquired manage and COPD exacerbation.  She was treated with IV antibiotics steroids and nebulized bronchodilators.  Weaned off of oxygen prior to discharge.  Discharged on oral antibiotics and steroids. Patient continues to have ongoing symptoms with cough shortness of breath and wheezing.  Says that her cough has been worse over the last 5 days now coughing up green-yellow mucus.  Fevers up to 99.3. has seen some blood tinged mucus . No chest pain, orthopnea , edema or calf pain  Has history of OSA not on CPAP . Unsure when/where sleep test was done years ago. Has loud snoring , restless sleep and daytime sleepiness.  Has chronic back pain .  Has oxycodone 10 mg 5 times daily, robaxin, buspar, trazodone.  Husband is sick at home with same symptoms . They tested for covid at home , both were negative .  CT chest done yesterday pending .    Allergies  Allergen Reactions   Bee Venom Anaphylaxis   Morphine And Related Other (See Comments)    Overly sedated   Neurontin [Gabapentin] Other (See Comments)    Nightmares    Codeine Hives   Adhesive [Tape] Itching and Other (See Comments)    Redness, Paper tape is ok   Sulfa Antibiotics Other (See Comments)    Childhood allergy Unknown reaction   Sulfonamide Derivatives  Other (See Comments)    Childhood allergy Unknown reaction    There is no immunization history for the selected administration types on file for this patient.  Past Medical History:  Diagnosis Date   Anxiety    Aortic atherosclerosis (HCC)    Bilateral cataracts    Carpal tunnel syndrome    Cervical cancer (HCC)    Chronic back pain    Claudication (HCC)    COPD (chronic obstructive pulmonary disease) (HCC)    wheezing   DDD (degenerative disc disease), cervical    DDD (degenerative disc disease), lumbar    Depression    Diverticulosis    Gastroparesis    GERD (gastroesophageal reflux disease)    Hearing loss    Bilateral   History of blood transfusion    History of colon polyps    History of migraine    Hypertension    Internal hemorrhoids    Iron deficiency anemia    Left-sided weakness    Leg swelling    Liver disease    pt unaware   Lung nodule    several small   OA (osteoarthritis)    both hands   Positive ANA (antinuclear antibody)    Pre-diabetes    Raynaud's phenomenon    Restless leg    Seizures (HCC)    no meds for 8 months   Sleep apnea    does not use her cpap   Spondylosis    Stroke ([place]) 2015   no weakness  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   rt   Urge incontinence of urine    Varicose vein of leg    Venous insufficiency     Tobacco History: Social History   Tobacco Use  Smoking Status Every Day   Packs/day: 0.50   Years: 55.00   Total pack years: 27.50   Types: Cigarettes   Passive exposure: Past  Smokeless Tobacco Never  Tobacco Comments   Pt states she smokes a half pack daily   Ready to quit: Not Answered Counseling given: Not Answered Tobacco comments: Pt states she smokes a half pack daily   Outpatient Medications Prior to Visit  Medication Sig Dispense Refill   acetaminophen (TYLENOL) 325 MG tablet Take 650 mg by mouth 2 (two) times daily as needed for headache.     albuterol (PROVENTIL) (2.5 MG/3ML) 0.083% nebulizer  solution Inhale 3 mLs into the lungs every 4 (four) hours as needed for wheezing or shortness of breath (cough). (Patient taking differently: Inhale 3 mLs into the lungs 2 (two) times daily as needed for wheezing or shortness of breath (cough).) 75 mL 12   albuterol (VENTOLIN HFA) 108 (90 Base) MCG/ACT inhaler Inhale 2 puffs into the lungs 2 (two) times daily as needed for wheezing or shortness of breath.     aspirin EC 81 MG tablet Take 1 tablet (81 mg total) by mouth daily. Swallow whole. 150 tablet 2   benzonatate (TESSALON) 100 MG capsule Take 1 capsule (100 mg total) by mouth 2 (two) times daily as needed for cough. 20 capsule 0   bisacodyl (FLEET) 10 MG/30ML ENEM Place 10 mg rectally once as needed (constipation).     busPIRone (BUSPAR) 5 MG tablet Take 1 tablet (5 mg total) by mouth 2 (two) times daily. (Patient taking differently: Take 5 mg by mouth.) 60 tablet 0   Cholecalciferol (VITAMIN D-3) 25 MCG (1000 UT) CAPS Take 2,000 Units by mouth daily.     cyanocobalamin (VITAMIN B12) 1000 MCG/ML injection Inject 1,000 mcg into the muscle every 30 (thirty) days.     docusate sodium (COLACE) 100 MG capsule Take 200 mg by mouth daily as needed (constipation).     DULoxetine (CYMBALTA) 60 MG capsule Take 60 mg by mouth at bedtime.     ferrous sulfate 324 MG TBEC Take 324 mg by mouth.     hydrocortisone (ANUSOL-HC) 25 MG suppository Place 1 suppository (25 mg total) rectally 2 (two) times daily as needed for hemorrhoids or anal itching. 12 suppository 0   linaclotide (LINZESS) 290 MCG CAPS capsule Take 1 capsule (290 mcg total) by mouth daily before breakfast. 30 capsule 1   losartan (COZAAR) 25 MG tablet Take 25 mg by mouth daily.     metoCLOPramide (REGLAN) 5 MG tablet Take 1 tablet by mouth 3 times daily 20-30 minutes before meals NO FURTHER REFILLS UNTIL SEEN, NEEDS AND APPOINTMENT (Patient taking differently: Take 5 mg by mouth daily before supper.) 90 tablet 0   nitroGLYCERIN (NITROSTAT) 0.4 MG  SL tablet Place 0.4 mg under the tongue every 5 (five) minutes x 3 doses as needed for chest pain.  1   Oxycodone HCl 10 MG TABS Take 10 mg by mouth 5 (five) times daily as needed (for moderate pain).     pantoprazole (PROTONIX) 40 MG tablet Take 40 mg by mouth daily as needed (acid reflux).     rOPINIRole (REQUIP) 1 MG tablet Take 1 mg by mouth 2 (two) times daily.     rosuvastatin (  CRESTOR) 10 MG tablet Take 10 mg by mouth at bedtime.     senna (SENOKOT) 8.6 MG tablet Take 17.2 mg by mouth daily as needed for constipation.     traZODone (DESYREL) 50 MG tablet TAKE 0.5-1 TABLETS (25-50 MG TOTAL) BY MOUTH AT BEDTIME AS NEEDED FOR SLEEP. (Patient taking differently: Take 25 mg by mouth at bedtime.) 90 tablet 1   methocarbamol (ROBAXIN) 500 MG tablet Take 1 tablet (500 mg total) by mouth every 6 (six) hours as needed for muscle spasms. (Patient not taking: Reported on 11/09/2021) 60 tablet 0   No facility-administered medications prior to visit.     Review of Systems:   Constitutional:   No  weight loss, night sweats,  Fevers, chills,  +fatigue, or  lassitude.  HEENT:   No headaches,  Difficulty swallowing,  Tooth/dental problems, or  Sore throat,                No sneezing, itching, ear ache, nasal congestion, post nasal drip,   CV:  No chest pain,  Orthopnea, PND, swelling in lower extremities, anasarca, dizziness, palpitations, syncope.   GI  No heartburn, indigestion, abdominal pain, nausea, vomiting, diarrhea, change in bowel habits, loss of appetite, bloody stools.   Resp:   No chest wall deformity  Skin: no rash or lesions.  GU: no dysuria, change in color of urine, no urgency or frequency.  No flank pain, no hematuria   MS:  + back pain.    Physical Exam  BP (!) 164/76 (BP Location: Left Arm, Patient Position: Sitting, Cuff Size: Large)   Pulse 100   Temp 97.9 F (36.6 C) (Oral)   Ht 5\' 1"  (1.549 m)   Wt 193 lb 12.8 oz (87.9 kg)   SpO2 97%   BMI 36.62 kg/m   GEN:  A/Ox3; pleasant , NAD, well nourished , walks with cane   HEENT:  [place]/AT,   NOSE-clear, THROAT-clear, no lesions, no postnasal drip or exudate noted.Class 3-4 MP airway   NECK:  Supple w/ fair ROM; no JVD; normal carotid impulses w/o bruits; no thyromegaly or nodules palpated; no lymphadenopathy.    RESP  coarse rhonchi bilaterally , speaks in full sentences . no accessory muscle use, no dullness to percussion  CARD:  RRR, no m/r/g, no peripheral edema, pulses intact, no cyanosis or clubbing.  GI:   Soft & nt; nml bowel sounds; no organomegaly or masses detected.   Musco: Warm bil, no deformities or joint swelling noted.   Neuro: alert, no focal deficits noted.    Skin: Warm, no lesions or rashes    Lab Results:  CBC    Component Value Date/Time   WBC 5.3 10/31/2021 1511   RBC 4.05 10/31/2021 1511   HGB 8.1 Repeated and verified X2. (L) 10/31/2021 1511   HGB 14.1 03/08/2020 1054   HCT 26.2 (L) 10/31/2021 1511   HCT 42.4 03/08/2020 1054   PLT 252.0 10/31/2021 1511   PLT 289 03/08/2020 1054   MCV 64.6 Repeated and verified X2. (L) 10/31/2021 1511   MCV 84 03/08/2020 1054   MCH 20.5 (L) 10/03/2021 0150   MCHC 30.8 10/31/2021 1511   RDW 22.8 (H) 10/31/2021 1511   RDW 14.3 03/08/2020 1054   LYMPHSABS 1.0 10/31/2021 1511   MONOABS 0.4 10/31/2021 1511   EOSABS 0.5 10/31/2021 1511   BASOSABS 0.0 10/31/2021 1511    BMET    Component Value Date/Time   NA 135 10/31/2021 1511   NA 140  09/28/2019 0948   K 4.0 10/31/2021 1511   K 3.5 03/19/2011 0722   CL 99 10/31/2021 1511   CO2 28 10/31/2021 1511   GLUCOSE 104 (H) 10/31/2021 1511   BUN 9 10/31/2021 1511   BUN 12 09/28/2019 0948   CREATININE 0.66 10/31/2021 1511   CALCIUM 8.6 10/31/2021 1511   GFRNONAA >60 10/02/2021 0121   GFRAA 89 09/28/2019 0948    BNP    Component Value Date/Time   BNP 31.1 11/10/2014 1212    ProBNP    Component Value Date/Time   PROBNP 42.7 07/31/2011 1105    Imaging: DG Chest 2  View  Result Date: 10/22/2021 CLINICAL DATA:  Patient with cough and chest pain. EXAM: CHEST - 2 VIEW COMPARISON:  Chest radiograph September 30, 2021 FINDINGS: Stable cardiac and mediastinal contours. Persistent right hilar prominence. Similar-appearing to mildly improved nodular area of consolidation right mid lung. No pleural effusion or pneumothorax. Thoracic spine degenerative changes. IMPRESSION: Persistent right hilar prominence with similar to mildly improved nodular area of consolidation right mid lung. Findings may represent persistent infectious process however underlying mass/right hilar adenopathy not excluded. Recommend further evaluation with contrast-enhanced chest CT to exclude underlying mass. These results will be called to the ordering clinician or representative by the Radiologist Assistant, and communication documented in the PACS or [employer]. Electronically Signed   By: Lovey Newcomer M.D.   On: 10/22/2021 12:12    acetaminophen (TYLENOL) tablet 650 mg     Date Action Dose Route User   11/05/2021 1148 Given 650 mg Oral Ranabhat, Sabitri, RN      diphenhydrAMINE (BENADRYL) capsule 25 mg     Date Action Dose Route User   11/05/2021 1148 Given 25 mg Oral Ranabhat, Sabitri, RN      ferumoxytol [facility]) 510 mg in sodium chloride 0.9 % 100 mL IVPB     Date Action Dose Route User   11/05/2021 1222 New Bag/Given 510 mg Intravenous Adelina Mings, LPN          Latest Ref Rng & Units 02/06/2018   10:51 AM  PFT Results  FVC-Pre L 2.21   FVC-Predicted Pre % 79   FVC-Post L 2.32   FVC-Predicted Post % 84   Pre FEV1/FVC % % 82   Post FEV1/FCV % % 85   FEV1-Pre L 1.81   FEV1-Predicted Pre % 86   FEV1-Post L 1.97   DLCO uncorrected ml/min/mmHg 12.51   DLCO UNC% % 59   DLVA Predicted % 77   TLC L 4.13   TLC % Predicted % 88   RV % Predicted % 94     No results found for: "NITRICOXIDE"      Assessment & Plan:   No problem-specific Assessment & Plan  notes found for this encounter.     Rexene Edison, NP 11/09/2021

## 2021-11-11 ENCOUNTER — Telehealth: Payer: Self-pay | Admitting: Emergency Medicine

## 2021-11-11 NOTE — Telephone Encounter (Signed)
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 She has OV w B. Volanda Napoleon on 11/6, will likely need bronch/EBUS if she is willing to proceed.   Will forward to Dr Elsworth Soho and to August Saucer for review

## 2021-11-12 ENCOUNTER — Ambulatory Visit (INDEPENDENT_AMBULATORY_CARE_PROVIDER_SITE_OTHER): Payer: Medicare Other

## 2021-11-12 ENCOUNTER — Telehealth: Payer: Self-pay | Admitting: Pulmonary Disease

## 2021-11-12 VITALS — BP 155/77 | HR 95 | Temp 98.5°F | Resp 16 | Ht 61.0 in | Wt 189.2 lb

## 2021-11-12 DIAGNOSIS — D509 Iron deficiency anemia, unspecified: Secondary | ICD-10-CM

## 2021-11-12 MED ORDER — DIPHENHYDRAMINE HCL 25 MG PO CAPS
25.0000 mg | ORAL_CAPSULE | Freq: Once | ORAL | Status: AC
  Administered 2021-11-12: 25 mg via ORAL
  Filled 2021-11-12: qty 1

## 2021-11-12 MED ORDER — SODIUM CHLORIDE 0.9 % IV SOLN
510.0000 mg | Freq: Once | INTRAVENOUS | Status: AC
  Administered 2021-11-12: 510 mg via INTRAVENOUS
  Filled 2021-11-12: qty 17

## 2021-11-12 MED ORDER — ACETAMINOPHEN 325 MG PO TABS
650.0000 mg | ORAL_TABLET | Freq: Once | ORAL | Status: AC
  Administered 2021-11-12: 650 mg via ORAL
  Filled 2021-11-12: qty 2

## 2021-11-12 NOTE — Progress Notes (Signed)
Diagnosis: Iron Deficiency Anemia  Provider:  Marshell Garfinkel MD  Procedure: Infusion  IV Type: Peripheral, IV Location: L Antecubital  Feraheme (Ferumoxytol), Dose: 510 mg  Infusion Start Time: 3810  Infusion Stop Time: 1210  Post Infusion IV Care: Peripheral IV Discontinued  Discharge: Condition: Good, Destination: Home . AVS provided to patient.   Performed by:  Koren Shiver, RN

## 2021-11-13 NOTE — Telephone Encounter (Signed)
Called and spoke with pt's spouse letting him know that the CT had not been resulted yet by Dr. Elsworth Soho but let him know that one we had the results, we would call back to let pt know what the results are and he verbalized understanding.   Dr. Elsworth Soho, please advise on the results of pt's recent CT.

## 2021-11-14 NOTE — Telephone Encounter (Signed)
Noted by triage. Nothing further needed at this time.

## 2021-11-19 ENCOUNTER — Ambulatory Visit: Payer: Medicare Other | Admitting: Primary Care

## 2021-11-20 ENCOUNTER — Encounter (HOSPITAL_COMMUNITY): Payer: Self-pay | Admitting: Pulmonary Disease

## 2021-11-20 ENCOUNTER — Other Ambulatory Visit: Payer: Self-pay

## 2021-11-20 NOTE — Progress Notes (Signed)
Spoke with [name] for pre-op call. [name] denies cardiac history. [name] is treated for HTN and states she had a stroke in 2015, no lasting weakness except her lower jaw no longer lines up and she cannot wear her lower dentures. [name] was admitted to the hospital in September for sepsis from pneumonia. She states she still has a cough from that.  She states she has been told she was pre-diabetic.   Shower instructions given to [name] and she voiced understanding.   The office did not tell her that she needed a Covid test, she will need one on arrival in the AM.

## 2021-11-20 NOTE — Anesthesia Preprocedure Evaluation (Signed)
Anesthesia Evaluation  Patient identified by MRN, date of birth, ID band Patient awake    Reviewed: Allergy & Precautions, NPO status , Patient's Chart, lab work & pertinent test results  Airway Mallampati: II  TM Distance: >3 FB Neck ROM: Full    Dental  (+) Edentulous Upper, Edentulous Lower   Pulmonary sleep apnea , COPD, Current Smoker and Patient abstained from smoking. Lung mass   Pulmonary exam normal        Cardiovascular hypertension, Pt. on medications + DOE   Rhythm:Regular Rate:Normal     Neuro/Psych Seizures -, Well Controlled,   Anxiety Depression    CVA    GI/Hepatic Neg liver ROS, PUD,GERD  Medicated,,  Endo/Other  negative endocrine ROS    Renal/GU negative Renal ROS     Musculoskeletal  (+) Arthritis , Osteoarthritis,    Abdominal Normal abdominal exam  (+)   Peds  Hematology  (+) Blood dyscrasia, anemia   Anesthesia Other Findings   Reproductive/Obstetrics                             Anesthesia Physical Anesthesia Plan  ASA: 3  Anesthesia Plan: General   Post-op Pain Management:    Induction: Intravenous  PONV Risk Score and Plan: 2 and Ondansetron, Dexamethasone and Treatment may vary due to age or medical condition  Airway Management Planned: Mask and Oral ETT  Additional Equipment: None  Intra-op Plan:   Post-operative Plan: Extubation in OR  Informed Consent: I have reviewed the patients History and Physical, chart, labs and discussed the procedure including the risks, benefits and alternatives for the proposed anesthesia with the patient or authorized representative who has indicated his/her understanding and acceptance.     Dental advisory given  Plan Discussed with: CRNA  Anesthesia Plan Comments: (Lab Results      Component                Value               Date                      WBC                      5.3                 10/31/2021                 HGB                                          10/31/2021            8.1 Repeated and verified X2. (L)      HCT                      26.2 (L)            10/31/2021                MCV                                          10/31/2021  64.6 Repeated and verified X2. (L)      PLT                      252.0               10/31/2021            Lab Results      Component                Value               Date                      NA                       135                 10/31/2021                K                        4.0                 10/31/2021                CO2                      28                  10/31/2021                GLUCOSE                  104 (H)             10/31/2021                BUN                      9                   10/31/2021                CREATININE               0.66                10/31/2021                CALCIUM                  8.6                 10/31/2021                GFRNONAA                 >60                 10/02/2021           )       Anesthesia Quick Evaluation

## 2021-11-21 ENCOUNTER — Ambulatory Visit (HOSPITAL_COMMUNITY): Payer: Medicare Other

## 2021-11-21 ENCOUNTER — Encounter (HOSPITAL_COMMUNITY)
Admission: RE | Disposition: A | Discharge status: Home / Self Care | Payer: Self-pay | Source: Home / Self Care | Attending: Pulmonary Disease

## 2021-11-21 ENCOUNTER — Ambulatory Visit (HOSPITAL_COMMUNITY)
Admission: RE | Admit: 2021-11-21 | Discharge: 2021-11-21 | Disposition: A | Discharge status: Home / Self Care | Payer: Medicare Other | Attending: Pulmonary Disease | Admitting: Pulmonary Disease

## 2021-11-21 ENCOUNTER — Ambulatory Visit (HOSPITAL_BASED_OUTPATIENT_CLINIC_OR_DEPARTMENT_OTHER): Payer: Medicare Other | Admitting: Certified Registered Nurse Anesthetist

## 2021-11-21 ENCOUNTER — Encounter (HOSPITAL_COMMUNITY): Payer: Self-pay | Admitting: Pulmonary Disease

## 2021-11-21 ENCOUNTER — Ambulatory Visit (HOSPITAL_COMMUNITY): Payer: Medicare Other | Admitting: Certified Registered Nurse Anesthetist

## 2021-11-21 ENCOUNTER — Telehealth: Payer: Self-pay | Admitting: Pulmonary Disease

## 2021-11-21 DIAGNOSIS — R918 Other nonspecific abnormal finding of lung field: Secondary | ICD-10-CM

## 2021-11-21 DIAGNOSIS — Z8673 Personal history of transient ischemic attack (TIA), and cerebral infarction without residual deficits: Secondary | ICD-10-CM | POA: Insufficient documentation

## 2021-11-21 DIAGNOSIS — C3401 Malignant neoplasm of right main bronchus: Secondary | ICD-10-CM | POA: Diagnosis not present

## 2021-11-21 DIAGNOSIS — Z79899 Other long term (current) drug therapy: Secondary | ICD-10-CM | POA: Diagnosis not present

## 2021-11-21 DIAGNOSIS — Z1152 Encounter for screening for COVID-19: Secondary | ICD-10-CM | POA: Insufficient documentation

## 2021-11-21 DIAGNOSIS — G4730 Sleep apnea, unspecified: Secondary | ICD-10-CM | POA: Diagnosis not present

## 2021-11-21 DIAGNOSIS — F32A Depression, unspecified: Secondary | ICD-10-CM | POA: Diagnosis not present

## 2021-11-21 DIAGNOSIS — R911 Solitary pulmonary nodule: Secondary | ICD-10-CM

## 2021-11-21 DIAGNOSIS — I10 Essential (primary) hypertension: Secondary | ICD-10-CM | POA: Insufficient documentation

## 2021-11-21 DIAGNOSIS — C3411 Malignant neoplasm of upper lobe, right bronchus or lung: Secondary | ICD-10-CM | POA: Insufficient documentation

## 2021-11-21 DIAGNOSIS — R590 Localized enlarged lymph nodes: Secondary | ICD-10-CM

## 2021-11-21 DIAGNOSIS — M1990 Unspecified osteoarthritis, unspecified site: Secondary | ICD-10-CM | POA: Insufficient documentation

## 2021-11-21 DIAGNOSIS — F419 Anxiety disorder, unspecified: Secondary | ICD-10-CM | POA: Insufficient documentation

## 2021-11-21 DIAGNOSIS — K219 Gastro-esophageal reflux disease without esophagitis: Secondary | ICD-10-CM | POA: Insufficient documentation

## 2021-11-21 DIAGNOSIS — F17210 Nicotine dependence, cigarettes, uncomplicated: Secondary | ICD-10-CM | POA: Diagnosis not present

## 2021-11-21 DIAGNOSIS — F17200 Nicotine dependence, unspecified, uncomplicated: Secondary | ICD-10-CM

## 2021-11-21 DIAGNOSIS — J449 Chronic obstructive pulmonary disease, unspecified: Secondary | ICD-10-CM | POA: Insufficient documentation

## 2021-11-21 DIAGNOSIS — J984 Other disorders of lung: Secondary | ICD-10-CM | POA: Diagnosis present

## 2021-11-21 HISTORY — DX: Dyspnea, unspecified: R06.00

## 2021-11-21 HISTORY — PX: VIDEO BRONCHOSCOPY WITH ENDOBRONCHIAL ULTRASOUND: SHX6177

## 2021-11-21 HISTORY — DX: Pneumonia, unspecified organism: J18.9

## 2021-11-21 HISTORY — PX: BRONCHIAL BRUSHINGS: SHX5108

## 2021-11-21 HISTORY — PX: HEMOSTASIS CONTROL: SHX6838

## 2021-11-21 HISTORY — PX: BRONCHIAL NEEDLE ASPIRATION BIOPSY: SHX5106

## 2021-11-21 HISTORY — PX: BRONCHIAL WASHINGS: SHX5105

## 2021-11-21 HISTORY — PX: BRONCHIAL BIOPSY: SHX5109

## 2021-11-21 LAB — CBC
HCT: 40.2 % (ref 36.0–46.0)
Hemoglobin: 12.3 g/dL (ref 12.0–15.0)
MCH: 24.6 pg — ABNORMAL LOW (ref 26.0–34.0)
MCHC: 30.6 g/dL (ref 30.0–36.0)
MCV: 80.4 fL (ref 80.0–100.0)
Platelets: 297 10*3/uL (ref 150–400)
RBC: 5 MIL/uL (ref 3.87–5.11)
WBC: 8.5 10*3/uL (ref 4.0–10.5)
nRBC: 0 % (ref 0.0–0.2)

## 2021-11-21 LAB — SARS CORONAVIRUS 2 BY RT PCR: SARS Coronavirus 2 by RT PCR: NEGATIVE

## 2021-11-21 LAB — BASIC METABOLIC PANEL
Anion gap: 13 (ref 5–15)
BUN: 14 mg/dL (ref 8–23)
CO2: 22 mmol/L (ref 22–32)
Calcium: 9.2 mg/dL (ref 8.9–10.3)
Chloride: 99 mmol/L (ref 98–111)
Creatinine, Ser: 0.68 mg/dL (ref 0.44–1.00)
GFR, Estimated: >60 mL/min (ref >60)
Glucose, Bld: 139 mg/dL — ABNORMAL HIGH (ref 70–99)
Potassium: 4 mmol/L (ref 3.5–5.1)
Sodium: 134 mmol/L — ABNORMAL LOW (ref 135–145)

## 2021-11-21 SURGERY — BRONCHOSCOPY, WITH EBUS
Anesthesia: General

## 2021-11-21 MED ORDER — FENTANYL CITRATE (PF) 100 MCG/2ML IJ SOLN
INTRAMUSCULAR | Status: AC
  Filled 2021-11-21: qty 2

## 2021-11-21 MED ORDER — ACETAMINOPHEN 10 MG/ML IV SOLN
1000.0000 mg | Freq: Once | INTRAVENOUS | Status: DC | PRN
  Filled 2021-11-21: qty 100

## 2021-11-21 MED ORDER — ALBUTEROL SULFATE HFA 108 (90 BASE) MCG/ACT IN AERS
INHALATION_SPRAY | RESPIRATORY_TRACT | Status: DC | PRN
  Administered 2021-11-21: 4 via RESPIRATORY_TRACT

## 2021-11-21 MED ORDER — FENTANYL CITRATE (PF) 100 MCG/2ML IJ SOLN: 25.0000 ug | INTRAMUSCULAR | Status: DC | PRN

## 2021-11-21 MED ORDER — ONDANSETRON HCL 4 MG/2ML IJ SOLN
INTRAMUSCULAR | Status: DC | PRN
  Administered 2021-11-21: 4 mg via INTRAVENOUS

## 2021-11-21 MED ORDER — DEXAMETHASONE SODIUM PHOSPHATE 10 MG/ML IJ SOLN
INTRAMUSCULAR | Status: DC | PRN
  Administered 2021-11-21: 10 mg via INTRAVENOUS

## 2021-11-21 MED ORDER — PHENYLEPHRINE 80 MCG/ML (10ML) SYRINGE FOR IV PUSH (FOR BLOOD PRESSURE SUPPORT)
PREFILLED_SYRINGE | INTRAVENOUS | Status: DC | PRN
  Administered 2021-11-21 (×3): 80 ug via INTRAVENOUS

## 2021-11-21 MED ORDER — SUGAMMADEX SODIUM 200 MG/2ML IV SOLN
INTRAVENOUS | Status: DC | PRN
  Administered 2021-11-21 (×4): 100 mg via INTRAVENOUS

## 2021-11-21 MED ORDER — PROPOFOL 10 MG/ML IV BOLUS
INTRAVENOUS | Status: DC | PRN
  Administered 2021-11-21: 40 mg via INTRAVENOUS
  Administered 2021-11-21: 140 mg via INTRAVENOUS
  Administered 2021-11-21: 30 mg via INTRAVENOUS

## 2021-11-21 MED ORDER — LIDOCAINE 2% (20 MG/ML) 5 ML SYRINGE
INTRAMUSCULAR | Status: DC | PRN
  Administered 2021-11-21: 60 mg via INTRAVENOUS
  Administered 2021-11-21: 40 mg via INTRAVENOUS

## 2021-11-21 MED ORDER — IPRATROPIUM-ALBUTEROL 0.5-2.5 (3) MG/3ML IN SOLN
RESPIRATORY_TRACT | Status: AC
  Administered 2021-11-21: 3 mL via RESPIRATORY_TRACT
  Filled 2021-11-21: qty 3

## 2021-11-21 MED ORDER — ROCURONIUM BROMIDE 10 MG/ML (PF) SYRINGE
PREFILLED_SYRINGE | INTRAVENOUS | Status: DC | PRN
  Administered 2021-11-21: 50 mg via INTRAVENOUS

## 2021-11-21 MED ORDER — CHLORHEXIDINE GLUCONATE 0.12 % MT SOLN
15.0000 mL | OROMUCOSAL | Status: AC
  Administered 2021-11-21: 15 mL via OROMUCOSAL
  Filled 2021-11-21 (×2): qty 15

## 2021-11-21 MED ORDER — FENTANYL CITRATE (PF) 100 MCG/2ML IJ SOLN
INTRAMUSCULAR | Status: DC | PRN
  Administered 2021-11-21 (×2): 50 ug via INTRAVENOUS

## 2021-11-21 MED ORDER — IPRATROPIUM-ALBUTEROL 0.5-2.5 (3) MG/3ML IN SOLN: 3.0000 mL | Freq: Once | RESPIRATORY_TRACT | Status: AC

## 2021-11-21 MED ORDER — LACTATED RINGERS IV SOLN: INTRAVENOUS | Status: DC

## 2021-11-21 MED ORDER — PHENYLEPHRINE HCL-NACL 20-0.9 MG/250ML-% IV SOLN
INTRAVENOUS | Status: DC | PRN
  Administered 2021-11-21: 30 ug/min via INTRAVENOUS

## 2021-11-21 NOTE — Op Note (Signed)
  Name:  [name] MRN:  [id number] DOB:  1950/10/21  PROCEDURE NOTE  Procedure(s): Flexible bronchoscopy [phone]) Brushing [phone]) of the bronchus intermedius Bronchial alveolar lavage (21224) of the right middle and lower lobes Endobronchial biopsy (82500) of the mass and bronchus intermedius  Endobronchial ultrasound (37048) Transbronchial needle aspiration (88916) of the 10 R station   Indications: Right infrahilar mass with mediastinal lymphadenopathy.  Consent:  Written informed consent was obtained prior to the procedure. The risks of the procedure including coughing, bleeding and the small chance of lung puncture requiring chest tube were discussed in great detail. The benefits & alternatives including serial follow up were also discussed.  Anesthesia:  General endotracheal.  Procedure summary:  Appropriate equipment was assembled.  The patient was  identified as [name]. Interim history obtained and brought to the operating room. Safety timeout was performed. The patient was placed supine on the operating table, airway established and general anesthesia administered by Anesthesia team.   After the appropriate level of anesthesia was assured, flexible video bronchoscope was lubricated and inserted through the endotracheal tube.    Airway examination was performed bilaterally to subsegmental level.  Moderate.  Cloudy  secretions were noted, excoriating mass noted at the bronchus intermedius which could not be traversed Endobronchial ultrasound video bronchoscope was then lubricated and inserted through the endotracheal tube. Surveillance of the mediastinal and and bilateral hilar lymph node stations was performed.  Pathologically enlarged lymph nodes were noted at 4R and 10 R but window was too small to aspirate 4R lymph node  Endobronchial ultrasound guided transbronchial needle aspiration of 10R (passes 4), was performed, after which EBUS bronchoscope was  withdrawn.  Flexible video bronchoscope was used again to perform random endobronchial mucosal biopsies from excoriating mass in the bronchus intermedius.  BAL and brushings were also obtained from this area.  After ensuring hemostasis , the bronchoscope was withdrawn.  The patient was extubated in operating room and transferred to PACU.   Specimens sent: Bronchial alveolar lavage specimen of the bronchus intermedius for  microbiology and cytology. 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  Hemodynamic parameters and oxygenation remained stable throughout the procedure.  Estimated blood loss:  Less then 15 mL.   Kara Mead MD. Shade Flood. [facility] Pager [phone] If no response call [phone]   11/21/2021 12:46 PM

## 2021-11-21 NOTE — Transfer of Care (Signed)
Immediate Anesthesia Transfer of Care Note  Patient: SAMARI BITTINGER  Procedure(s) Performed: VIDEO BRONCHOSCOPY WITH ENDOBRONCHIAL ULTRASOUND BRONCHIAL WASHINGS BRONCHIAL BRUSHINGS BRONCHIAL BIOPSIES BRONCHIAL NEEDLE ASPIRATION BIOPSIES HEMOSTASIS CONTROL  Patient Location: PACU  Anesthesia Type:General  Level of Consciousness: drowsy and patient cooperative  Airway & Oxygen Therapy: Patient Spontanous Breathing and Patient connected to face mask oxygen  Post-op Assessment: Report given to RN and Post -op Vital signs reviewed and stable  Post vital signs: Reviewed and stable  Last Vitals:  Vitals Value Taken Time  BP 167/67 11/21/21 1302  Temp    Pulse 93 11/21/21 1308  Resp 20 11/21/21 1308  SpO2 98 % 11/21/21 1308  Vitals shown include unvalidated device data.  Last Pain:  Vitals:   11/21/21 0807  TempSrc: Oral  PainSc:       Patients Stated Pain Goal: 0 (48/25/00 3704)  Complications: No notable events documented.

## 2021-11-21 NOTE — Progress Notes (Signed)
Patient had coffee with powdered creamer at 0500. Anesthesia made aware and stated procedure would have to be delayed for six hours. MD, patient, and ENDO made aware.

## 2021-11-21 NOTE — Anesthesia Postprocedure Evaluation (Signed)
Anesthesia Post Note  Patient: [name]  Procedure(s) Performed: VIDEO BRONCHOSCOPY WITH ENDOBRONCHIAL ULTRASOUND BRONCHIAL WASHINGS BRONCHIAL BRUSHINGS BRONCHIAL BIOPSIES BRONCHIAL NEEDLE ASPIRATION BIOPSIES HEMOSTASIS CONTROL     Patient location during evaluation: PACU Anesthesia Type: General Level of consciousness: awake and alert Pain management: pain level controlled Vital Signs Assessment: post-procedure vital signs reviewed and stable Respiratory status: spontaneous breathing, nonlabored ventilation, respiratory function stable and patient connected to nasal cannula oxygen Cardiovascular status: blood pressure returned to baseline and stable Postop Assessment: no apparent nausea or vomiting Anesthetic complications: no   No notable events documented.  Last Vitals:  Vitals:   11/21/21 1515 11/21/21 1545  BP: (!) 156/74 (!) 168/80  Pulse: 90 91  Resp: 12 14  Temp:    SpO2: 91% (!) 87%    Last Pain:  Vitals:   11/21/21 1515  TempSrc:   PainSc: 0-No pain                 Belenda Cruise P Jeanenne Licea

## 2021-11-21 NOTE — Interval H&P Note (Signed)
History and Physical Interval Note:  11/21/2021 11:24 AM  [name]  has presented today for surgery, with the diagnosis of lung mass.  The various methods of treatment have been discussed with the patient and family. After consideration of risks, benefits and other options for treatment, the patient has consented to  Procedure(s): [place] (N/A) as a surgical intervention.  The patient's history has been reviewed, patient examined, no change in status, stable for surgery.  I have reviewed the patient's chart and labs.  Questions were answered to the patient's satisfaction.     Leanna Sato Elsworth Soho

## 2021-11-21 NOTE — Telephone Encounter (Signed)
Please schedule outpatient PET scan and follow-up with TP after. Bronchoscopy with biopsies and brushings were performed today

## 2021-11-21 NOTE — Anesthesia Procedure Notes (Signed)
Procedure Name: Intubation Date/Time: 11/21/2021 11:37 AM  Performed by: Janene Harvey, CRNAPre-anesthesia Checklist: Patient identified, Emergency Drugs available, Suction available and Patient being monitored Patient Re-evaluated:Patient Re-evaluated prior to induction Oxygen Delivery Method: Circle system utilized Preoxygenation: Pre-oxygenation with 100% oxygen Induction Type: IV induction Ventilation: Mask ventilation without difficulty and Oral airway inserted - appropriate to patient size Laryngoscope Size: Mac and 4 Grade View: Grade I Tube type: Oral Tube size: 8.5 mm Number of attempts: 1 Airway Equipment and Method: Stylet and Oral airway Placement Confirmation: ETT inserted through vocal cords under direct vision, positive ETCO2 and breath sounds checked- equal and bilateral Secured at: 22 cm Tube secured with: Tape Dental Injury: Teeth and Oropharynx as per pre-operative assessment

## 2021-11-22 NOTE — Telephone Encounter (Signed)
Spoke to patient, ordered PET scan as per Dr Elsworth Soho. Patient verbalized understanding nothing further needed.

## 2021-11-23 ENCOUNTER — Encounter (HOSPITAL_COMMUNITY): Payer: Self-pay | Admitting: Pulmonary Disease

## 2021-11-23 LAB — CULTURE, RESPIRATORY W GRAM STAIN
Culture: NORMAL
Gram Stain: NONE SEEN

## 2021-11-23 LAB — SURGICAL PATHOLOGY

## 2021-11-23 LAB — CYTOLOGY - NON PAP

## 2021-11-24 LAB — ACID FAST SMEAR (AFB, MYCOBACTERIA): Acid Fast Smear: NEGATIVE

## 2021-11-26 ENCOUNTER — Telehealth: Payer: Self-pay | Admitting: Pulmonary Disease

## 2021-11-26 NOTE — Telephone Encounter (Signed)
If she declines a sleep study that is up to her.  Please explained to patient as we discussed at office visit untreated sleep apnea can lead to potential cardiovascular complications such as congestive heart failure, heart arrhythmias etc..  If she continues to decline can cancel sleep study we will discuss it at her follow-up visit

## 2021-11-26 NOTE — Telephone Encounter (Signed)
Patient said she sleeps very well and want to know if she can cancel her in lab sleep study.    She has been made aware of pet scan and follow up with parrett.

## 2021-11-29 ENCOUNTER — Other Ambulatory Visit: Payer: Self-pay | Admitting: *Deleted

## 2021-11-29 NOTE — Progress Notes (Signed)
The proposed treatment discussed in conference is for discussion purpose only and is not a binding recommendation.  The patients have not been physically examined, or presented with their treatment options.  Therefore, final treatment plans cannot be decided.  

## 2021-11-30 NOTE — Telephone Encounter (Signed)
Pathology shows small cell carcinoma -  Discussed in detail with patient  Reviewed at tumor board  Rec: Concurrent chemoradiation, MRI brain. PET pending    Please set up  Oncology referral  Radiation Oncology referral  MRI brain

## 2021-12-03 ENCOUNTER — Other Ambulatory Visit: Payer: Self-pay | Admitting: *Deleted

## 2021-12-03 ENCOUNTER — Telehealth: Payer: Self-pay | Admitting: Internal Medicine

## 2021-12-03 DIAGNOSIS — C3490 Malignant neoplasm of unspecified part of unspecified bronchus or lung: Secondary | ICD-10-CM

## 2021-12-03 NOTE — Telephone Encounter (Signed)
Scheduled appointment per 11/20 referral. Patient is aware of appointment date and time. Patient is aware to arrive 15 mins prior to appointment time and to bring updated insurance cards. Patient is aware of location.

## 2021-12-04 NOTE — Progress Notes (Addendum)
Thoracic Location of Tumor / Histology: right lung small cell carcinoma   Patient presented with a left lung nodule about a year ago.  Biopsies revealed:   11/21/2021 FINAL MICROSCOPIC DIAGNOSIS:  A. LYMPH NODE, 10R, FINE NEEDLE ASPIRATION:  - Benign reactive/reparative changes  D. LUNG, RIGHT BRONCHUS INTERMEDIUS, BRUSHING:  - Atypical cells present   FINAL MICROSCOPIC DIAGNOSIS:   A. BRONCHUS, RIGHT INTERMEDIUS, BIOPSY:  - Small cell carcinoma.  See comment.   B. LUNG, RIGHT UPPER LOBE, BIOPSY:  - Small cell carcinoma.  See comment.   C. LUNG, RIGHT BRONCHUS INTERMEDIUS, LAVAGE:    FINAL MICROSCOPIC DIAGNOSIS:  - Atypical cells present   E. LUNG, RLL, LAVAGE:    FINAL MICROSCOPIC DIAGNOSIS:  - No malignant cells identified    Tobacco/Marijuana/Snuff/ETOH use: has smoked for 55 years.  Currently smokes half a pack per day.  Past/Anticipated interventions by cardiothoracic surgery, if any: no  Past/Anticipated interventions by medical oncology, if any: apt with Dr. Julien Nordmann 12/20/2021  Signs/Symptoms Weight changes, if any: yes has lost 8 lbs in the last month Respiratory complaints, if any: has shortness of breath and has a productive cough Hemoptysis, if any: yes - when she first gets up. Pain issues, if any:  yes - has pain in her left shoulder that radiates to her right chest for the past 3 weeks.  She is rating it a 9/10 today.  SAFETY ISSUES: Prior radiation? no Pacemaker/ICD? no  Possible current pregnancy?no Is the patient on methotrexate? no  Current Complaints / other details:  Concurrent chemoradiation is recommended.  BP 124/69 (BP Location: Left Arm, Patient Position: Sitting)   Pulse (!) 109   Temp 98.1 F (36.7 C) (Oral)   Resp 18   Ht 5\' 1"  (1.549 m)   Wt 181 lb 6 oz (82.3 kg)   SpO2 99%   BMI 34.27 kg/m

## 2021-12-10 ENCOUNTER — Encounter (HOSPITAL_BASED_OUTPATIENT_CLINIC_OR_DEPARTMENT_OTHER): Payer: Medicare Other | Admitting: Pulmonary Disease

## 2021-12-10 ENCOUNTER — Telehealth: Payer: Self-pay | Admitting: Adult Health

## 2021-12-10 NOTE — Telephone Encounter (Signed)
Pt wants prescription for anxiety meds for PET scan on Wednesday. Please advise

## 2021-12-12 ENCOUNTER — Encounter (HOSPITAL_COMMUNITY)
Admission: RE | Admit: 2021-12-12 | Discharge: 2021-12-12 | Disposition: A | Discharge status: Home / Self Care | Payer: Medicare Other | Source: Ambulatory Visit | Attending: Pulmonary Disease | Admitting: Pulmonary Disease

## 2021-12-12 DIAGNOSIS — R911 Solitary pulmonary nodule: Secondary | ICD-10-CM | POA: Insufficient documentation

## 2021-12-12 LAB — GLUCOSE, CAPILLARY: Glucose-Capillary: 113 mg/dL — ABNORMAL HIGH (ref 70–99)

## 2021-12-12 MED ORDER — FLUDEOXYGLUCOSE F - 18 (FDG) INJECTION
9.2000 | Freq: Once | INTRAVENOUS | Status: AC
  Administered 2021-12-12: 9.2 via INTRAVENOUS

## 2021-12-12 NOTE — Progress Notes (Signed)
Radiation Oncology         [phone] ________________________________  Initial Outpatient Consultation  Name: [name] MRN: [id number]  Date: 12/13/2021  DOB: [birth year]  PP:IRJJ, Almyra Free, NP  Rigoberto Noel, MD   REFERRING PHYSICIAN: Rigoberto Noel, MD  DIAGNOSIS: There were no encounter diagnoses.  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 female who is accompanied by ***. she is seen as a courtesy of Dr. Elsworth Soho for an opinion concerning radiation therapy as part of management for her recently diagnosed right lung cancer. She has a 59 pack year smoking history, and has been smoking since she was a teenager.  The patient presented for a lung cancer screening CT on 04/30/21 which revealed new right lower lobe pulmonary nodule measuring 7.5 mm in diameter (Lung-RADS 4A, suspicious). On record review, it seems as though she did not seek follow-up for this finding in the following months.   In more recent history, the patient presented to the ED on 09/30/21 with SOB and chest pain thought to be due to community acquired pneumonia vs COPD exacerbation. She met severe sepsis criteria with tachycardia, leukocytosis, and lactic acidosis. Hospital course initially included cefepime, which was transitioned to ceftriaxone and azithromycin, then transitioned to cefdinir. Chest x-ray performed in the ED demonstrated an airspace opacity in the superior segment of the right lower lobe, which was thought to be consistent with pneumonia at the time.  The patient accordingly established care with Dr. Elsworth Soho on 10/22/21. During this visit, the patient reported no change in her SOB. She did endorse a recent cough productive of yellow-green sputum x 3 days, as well as new right pleuritic chest pain that started 3 days prior, which radiated to her back especially with coughing. CXR performed on the date of this  visit showed persistence of the right hilar prominence with similar to mild improvement of the nodular area of consolidation in the right mid lung. Likely differential included persistent infectious process, however underlying mass/right hilar adenopathy could not be excluded.    Chest CT with contrast on 11/08/21 showed the new infrahilar masslike consolidation of the superior segment right lower lobe measuring 5.3 x 4.1 cm, as well as newly enlarged mediastinal and right hilar lymph nodes. CT also showed a small right pleural effusion with associated interlobular septal thickening and pleural nodularity. Overall, these findings raised high concern for primary lung malignancy with nodal, pleural, and lymphangitic metastatic disease.      The patient opted to proceed with bronchoscopy and biopsies on 11/21/21 under the care of Dr. Elsworth Soho. Biopsies of the right upper lobe and right intermedius bronchus revealed findings consistent with small cell carcinoma. FNA of lymph node 10R showed benign findings. RLL lavage also performed showed no evidence of malignancy.   Pertinent imaging performed thus far includes a PET scan yesterday which showed ***  Of note: her case was presented at the tumor board held on 11/29/21.    PREVIOUS RADIATION THERAPY: No  PAST MEDICAL HISTORY:  Past Medical History:  Diagnosis Date   Anxiety    Aortic atherosclerosis (HCC)    Bilateral cataracts    Carpal tunnel syndrome    Cervical cancer (HCC)    Chronic back pain    Claudication (HCC)    COPD (chronic obstructive pulmonary disease) (HCC)    wheezing   DDD (degenerative disc disease), cervical    DDD (degenerative  disc disease), lumbar    Depression    Diverticulosis    Dyspnea    on exertion   Gastroparesis    GERD (gastroesophageal reflux disease)    Hearing loss    Bilateral   History of blood transfusion    History of colon polyps    History of migraine    Hypertension    Internal hemorrhoids     Iron deficiency anemia    Leg swelling    Liver disease    pt unaware   Lung nodule    several small   OA (osteoarthritis)    both hands   Pneumonia    Positive ANA (antinuclear antibody)    Pre-diabetes    Raynaud's phenomenon    Restless leg    Seizures ([place])    no meds for 8 months   Sleep apnea    does not use her cpap   Spondylosis    Stroke ([place]) 2015   can't wear lower dentures because jaw wouldn't line up after the stroke   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   rt   Urge incontinence of urine    Varicose vein of leg    Venous insufficiency     PAST SURGICAL HISTORY: Past Surgical History:  Procedure Laterality Date   BACK SURGERY     BIOPSY  08/31/2021   Procedure: BIOPSY;  Surgeon: Thornton Park, MD;  Location: [facility] ENDOSCOPY;  Service: Gastroenterology;;   BLEPHAROPLASTY  10/10/2017   BRONCHIAL BIOPSY  11/21/2021   Procedure: BRONCHIAL BIOPSIES;  Surgeon: Rigoberto Noel, MD;  Location: [place] ENDOSCOPY;  Service: Cardiopulmonary;;   BRONCHIAL BRUSHINGS  11/21/2021   Procedure: BRONCHIAL BRUSHINGS;  Surgeon: Rigoberto Noel, MD;  Location: [facility] ENDOSCOPY;  Service: Cardiopulmonary;;   BRONCHIAL NEEDLE ASPIRATION BIOPSY  11/21/2021   Procedure: BRONCHIAL NEEDLE ASPIRATION BIOPSIES;  Surgeon: Rigoberto Noel, MD;  Location: [place] ENDOSCOPY;  Service: Cardiopulmonary;;   BRONCHIAL WASHINGS  11/21/2021   Procedure: BRONCHIAL WASHINGS;  Surgeon: Rigoberto Noel, MD;  Location: [place];  Service: Cardiopulmonary;;   CATARACT EXTRACTION, BILATERAL     CESAREAN SECTION     CHOLECYSTECTOMY     COLONOSCOPY  10/2016   COLONOSCOPY WITH PROPOFOL N/A 08/31/2021   Procedure: COLONOSCOPY WITH PROPOFOL;  Surgeon: Thornton Park, MD;  Location: [place];  Service: Gastroenterology;  Laterality: N/A;   ESOPHAGOGASTRODUODENOSCOPY (EGD) WITH PROPOFOL N/A 08/31/2021   Procedure: ESOPHAGOGASTRODUODENOSCOPY (EGD) WITH PROPOFOL;  Surgeon: Thornton Park, MD;  Location: [place];  Service:  Gastroenterology;  Laterality: N/A;   HAND SURGERY Bilateral    carpal tunnel   HEMORRHOID SURGERY     HEMOSTASIS CONTROL  11/21/2021   Procedure: HEMOSTASIS CONTROL;  Surgeon: Rigoberto Noel, MD;  Location: [place] ENDOSCOPY;  Service: Cardiopulmonary;;   INTERSTIM IMPLANT PLACEMENT     KNEE SURGERY Left    arthroscopy x3   LOWER EXTREMITY ANGIOGRAPHY N/A 07/16/2016   Procedure: Lower Extremity Angiography;  Surgeon: Adrian Prows, MD;  Location: [place] CV LAB;  Service: Cardiovascular;  Laterality: N/A;   LOWER EXTREMITY INTERVENTION N/A 08/06/2016   Procedure: Lower Extremity Intervention;  Surgeon: Adrian Prows, MD;  Location: [place] CV LAB;  Service: Cardiovascular;  Laterality: N/A;   NECK SURGERY     OTHER SURGICAL HISTORY  2013   bladder stimulator in back   PERIPHERAL VASCULAR BALLOON ANGIOPLASTY  08/06/2016   Procedure: Peripheral Vascular Balloon Angioplasty;  Surgeon: Adrian Prows, MD;  Location: [place] CV LAB;  Service: Cardiovascular;;  Right SFA   POLYPECTOMY  08/31/2021   Procedure: POLYPECTOMY;  Surgeon: Thornton Park, MD;  Location: [facility] ENDOSCOPY;  Service: Gastroenterology;;   TENNIS ELBOW RELEASE/NIRSCHEL PROCEDURE Right 12/2015   TOE SURGERY Right    right foot second and fourth toe   TOTAL HIP ARTHROPLASTY Left    TOTAL HIP REVISION Left 10/13/2017   Procedure: LEFT TOTAL HIP REVISION ACETABULUM, OPEN REDUCTION INTERNAL WITH BONE GRAFT FIXATION LEFT GREATER TROCHANTERIC FRACTURE;  Surgeon: Frederik Pear, MD;  Location: [facility];  Service: Orthopedics;  Laterality: Left;   TOTAL KNEE ARTHROPLASTY Right 01/12/2016   Procedure: RIGHT TOTAL KNEE ARTHROPLASTY;  Surgeon: Netta Cedars, MD;  Location: [place];  Service: Orthopedics;  Laterality: Right;   TUBAL LIGATION     UPPER GI ENDOSCOPY  10/2016   VAGINAL HYSTERECTOMY     VIDEO BRONCHOSCOPY WITH ENDOBRONCHIAL ULTRASOUND N/A 11/21/2021   Procedure: VIDEO BRONCHOSCOPY WITH ENDOBRONCHIAL ULTRASOUND;  Surgeon: Rigoberto Noel,  MD;  Location: [place];  Service: Cardiopulmonary;  Laterality: N/A;    FAMILY HISTORY:  Family History  Problem Relation Age of Onset   Hyperlipidemia Mother    Hypertension Mother    Anxiety disorder Mother    Depression Mother    Hyperlipidemia Father    Hypertension Father    Alzheimer's disease Father    Anxiety disorder Brother    Cancer Daughter        unknown   Anxiety disorder Brother    Anxiety disorder Brother    Drug abuse Brother    Stroke Brother    Colon cancer Neg Hx    Esophageal cancer Neg Hx    Rectal cancer Neg Hx    Stomach cancer Neg Hx     SOCIAL HISTORY:  Social History   Tobacco Use   Smoking status: Every Day    Packs/day: 0.50    Years: 55.00    Total pack years: 27.50    Types: Cigarettes    Passive exposure: Past   Smokeless tobacco: Never   Tobacco comments:    Pt states she smokes a half pack daily  Vaping Use   Vaping Use: Never used  Substance Use Topics   Alcohol use: No    Alcohol/week: 0.0 standard drinks of alcohol   Drug use: Never    ALLERGIES:  Allergies  Allergen Reactions   Bee Venom Anaphylaxis   Morphine And Related Other (See Comments)    Overly sedated   Neurontin [Gabapentin] Other (See Comments)    Nightmares    Codeine Hives   Adhesive [Tape] Itching and Other (See Comments)    Redness, Paper tape is ok   Sulfa Antibiotics Other (See Comments)    Childhood allergy Unknown reaction   Sulfonamide Derivatives Other (See Comments)    Childhood allergy Unknown reaction    MEDICATIONS:  Current Outpatient Medications  Medication Sig Dispense Refill   acetaminophen (TYLENOL) 325 MG tablet Take 650 mg by mouth 2 (two) times daily as needed for headache.     albuterol (PROVENTIL) (2.5 MG/3ML) 0.083% nebulizer solution Inhale 3 mLs into the lungs every 4 (four) hours as needed for wheezing or shortness of breath (cough). (Patient taking differently: Inhale 3 mLs into the lungs 2 (two) times daily as  needed for wheezing or shortness of breath (cough).) 75 mL 12   albuterol (VENTOLIN HFA) 108 (90 Base) MCG/ACT inhaler Inhale 2 puffs into the lungs 2 (two) times daily as needed for wheezing or shortness of breath.  amoxicillin-clavulanate (AUGMENTIN) 875-125 MG tablet Take 1 tablet by mouth 2 (two) times daily. 14 tablet 0   aspirin EC 81 MG tablet Take 1 tablet (81 mg total) by mouth daily. Swallow whole. (Patient taking differently: Take 81 mg by mouth daily as needed (if do not take acetaminophen). Swallow whole.) 150 tablet 2   benzonatate (TESSALON) 100 MG capsule Take 1 capsule (100 mg total) by mouth 2 (two) times daily as needed for cough. (Patient not taking: Reported on 11/19/2021) 20 capsule 0   busPIRone (BUSPAR) 5 MG tablet Take 1 tablet (5 mg total) by mouth 2 (two) times daily. 60 tablet 0   Cholecalciferol (VITAMIN D-3 PO) Take 2,000 Units by mouth daily.     cyanocobalamin (VITAMIN B12) 1000 MCG/ML injection Inject 1,000 mcg into the muscle every 30 (thirty) days.     docusate sodium (COLACE) 100 MG capsule Take 200 mg by mouth daily as needed (constipation).     DULoxetine (CYMBALTA) 60 MG capsule Take 60 mg by mouth at bedtime.     ferrous sulfate 324 MG TBEC Take 324 mg by mouth.     hydrocortisone (ANUSOL-HC) 25 MG suppository Place 1 suppository (25 mg total) rectally 2 (two) times daily as needed for hemorrhoids or anal itching. 12 suppository 0   linaclotide (LINZESS) 290 MCG CAPS capsule Take 1 capsule (290 mcg total) by mouth daily before breakfast. (Patient taking differently: Take 290 mcg by mouth daily as needed (Constipation).) 30 capsule 1   losartan (COZAAR) 25 MG tablet Take 25 mg by mouth daily.     methocarbamol (ROBAXIN) 500 MG tablet Take 1 tablet (500 mg total) by mouth every 6 (six) hours as needed for muscle spasms. 60 tablet 0   metoCLOPramide (REGLAN) 5 MG tablet Take 1 tablet by mouth 3 times daily 20-30 minutes before meals NO FURTHER REFILLS UNTIL SEEN,  NEEDS AND APPOINTMENT (Patient taking differently: Take 5 mg by mouth 3 (three) times daily before meals.) 90 tablet 0   nitroGLYCERIN (NITROSTAT) 0.4 MG SL tablet Place 0.4 mg under the tongue every 5 (five) minutes x 3 doses as needed for chest pain.  1   Oxycodone HCl 10 MG TABS Take 10 mg by mouth 5 (five) times daily as needed (for moderate pain).     pantoprazole (PROTONIX) 40 MG tablet Take 40 mg by mouth daily as needed (acid reflux).     predniSONE (DELTASONE) 20 MG tablet Take 1 tablet (20 mg total) by mouth daily with breakfast. 5 tablet 0   rOPINIRole (REQUIP) 1 MG tablet Take 1 mg by mouth 2 (two) times daily.     rosuvastatin (CRESTOR) 10 MG tablet Take 10 mg by mouth at bedtime.     senna (SENOKOT) 8.6 MG tablet Take 17.2 mg by mouth daily as needed for constipation.     Tiotropium Bromide-Olodaterol (STIOLTO RESPIMAT) 2.5-2.5 MCG/ACT AERS Inhale 2 puffs into the lungs daily. 1 each 5   Tiotropium Bromide-Olodaterol (STIOLTO RESPIMAT) 2.5-2.5 MCG/ACT AERS Inhale 2 puffs into the lungs daily. (Patient not taking: Reported on 11/19/2021) 4 g 0   traZODone (DESYREL) 50 MG tablet TAKE 0.5-1 TABLETS (25-50 MG TOTAL) BY MOUTH AT BEDTIME AS NEEDED FOR SLEEP. (Patient taking differently: Take 50 mg by mouth at bedtime.) 90 tablet 1   No current facility-administered medications for this encounter.    REVIEW OF SYSTEMS:  A 10+ POINT REVIEW OF SYSTEMS WAS OBTAINED including neurology, dermatology, psychiatry, cardiac, respiratory, lymph, extremities, GI, GU, musculoskeletal, constitutional,  reproductive, HEENT. ***   PHYSICAL EXAM:  vitals were not taken for this visit.   General: Alert and oriented, in no acute distress HEENT: Head is normocephalic. Extraocular movements are intact. Oropharynx is clear. Neck: Neck is supple, no palpable cervical or supraclavicular lymphadenopathy. Heart: Regular in rate and rhythm with no murmurs, rubs, or gallops. Chest: Clear to auscultation bilaterally,  with no rhonchi, wheezes, or rales. Abdomen: Soft, nontender, nondistended, with no rigidity or guarding. Extremities: No cyanosis or edema. Lymphatics: see Neck Exam Skin: No concerning lesions. Musculoskeletal: symmetric strength and muscle tone throughout. Neurologic: Cranial nerves II through XII are grossly intact. No obvious focalities. Speech is fluent. Coordination is intact. Psychiatric: Judgment and insight are intact. Affect is appropriate. ***  ECOG = ***  0 - Asymptomatic (Fully active, able to carry on all predisease activities without restriction)  1 - Symptomatic but completely ambulatory (Restricted in physically strenuous activity but ambulatory and able to carry out work of a light or sedentary nature. For example, light housework, office work)  2 - Symptomatic, <50% in bed during the day (Ambulatory and capable of all self care but unable to carry out any work activities. Up and about more than 50% of waking hours)  3 - Symptomatic, >50% in bed, but not bedbound (Capable of only limited self-care, confined to bed or chair 50% or more of waking hours)  4 - Bedbound (Completely disabled. Cannot carry on any self-care. Totally confined to bed or chair)  5 - Death   Eustace Pen MM, Creech RH, Tormey DC, et al. [phone]). "Toxicity and response criteria of the [facility]". [place] Oncol. 5 (6): 649-55  LABORATORY DATA:  Lab Results  Component Value Date   WBC 8.5 11/21/2021   HGB 12.3 11/21/2021   HCT 40.2 11/21/2021   MCV 80.4 11/21/2021   PLT 297 11/21/2021   NEUTROABS 3.4 10/31/2021   Lab Results  Component Value Date   NA 134 (L) 11/21/2021   K 4.0 11/21/2021   CL 99 11/21/2021   CO2 22 11/21/2021   GLUCOSE 139 (H) 11/21/2021   BUN 14 11/21/2021   CREATININE 0.68 11/21/2021   CALCIUM 9.2 11/21/2021      RADIOGRAPHY: DG CHEST PORT 1 VIEW  Result Date: 11/21/2021 CLINICAL DATA:  Status post bronchoscopy EXAM: PORTABLE CHEST 1 VIEW  COMPARISON:  Previous studies including radiographs done on 10/22/2021 and CT done on 11/08/2021 FINDINGS: Cardiac size is within normal limits. There is increased density overlying the right hilum corresponding to a masslike consolidation seen in the previous CT. There is subtle increase in interstitial markings in right mid and right lower lung fields. Left lung is clear. There is no pleural effusion or pneumothorax. There is previous surgical fusion in cervical spine. IMPRESSION: There is abnormal prominence of right hilum suggesting possible central neoplastic process. Increased interstitial markings are seen in right parahilar region and right lower lung fields suggesting possible interstitial pneumonia. There is no pleural effusion or pneumothorax. Electronically Signed   By: Elmer Picker M.D.   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  We discussed the natural history of *** and general treatment, highlighting the role of radiotherapy in the management.  We discussed the available radiation techniques, and focused on the details of logistics and delivery.  We reviewed the anticipated acute and late sequelae associated with radiation in this setting.  The patient was encouraged to ask questions that I answered to the best of my ability. *** A patient consent form was discussed and signed.  We retained a copy for our records.  The patient would like to proceed with radiation and will be scheduled for CT simulation.  PLAN: ***    *** minutes of total time was spent for this patient encounter, including preparation, face-to-face counseling with the patient and coordination of care, physical exam, and documentation of the encounter.   ------------------------------------------------  Blair Promise, PhD, MD  This document serves  as a record of services personally performed by Gery Pray, MD. It was created on his behalf by Roney Mans, a trained medical scribe. The creation of this record is based on the scribe's personal observations and the provider's statements to them. This document has been checked and approved by the attending provider.

## 2021-12-13 ENCOUNTER — Ambulatory Visit
Admission: RE | Admit: 2021-12-13 | Discharge: 2021-12-13 | Disposition: A | Discharge status: Home / Self Care | Payer: Medicare Other | Source: Ambulatory Visit | Attending: Radiation Oncology | Admitting: Radiation Oncology

## 2021-12-13 ENCOUNTER — Encounter: Payer: Self-pay | Admitting: Radiation Oncology

## 2021-12-13 VITALS — BP 124/69 | HR 109 | Temp 98.1°F | Resp 18 | Ht 61.0 in | Wt 181.4 lb

## 2021-12-13 DIAGNOSIS — R042 Hemoptysis: Secondary | ICD-10-CM | POA: Insufficient documentation

## 2021-12-13 DIAGNOSIS — Z7952 Long term (current) use of systemic steroids: Secondary | ICD-10-CM | POA: Diagnosis not present

## 2021-12-13 DIAGNOSIS — R630 Anorexia: Secondary | ICD-10-CM | POA: Insufficient documentation

## 2021-12-13 DIAGNOSIS — Z79899 Other long term (current) drug therapy: Secondary | ICD-10-CM | POA: Insufficient documentation

## 2021-12-13 DIAGNOSIS — R918 Other nonspecific abnormal finding of lung field: Secondary | ICD-10-CM

## 2021-12-13 DIAGNOSIS — K3184 Gastroparesis: Secondary | ICD-10-CM | POA: Insufficient documentation

## 2021-12-13 DIAGNOSIS — G4730 Sleep apnea, unspecified: Secondary | ICD-10-CM | POA: Diagnosis not present

## 2021-12-13 DIAGNOSIS — Z8719 Personal history of other diseases of the digestive system: Secondary | ICD-10-CM | POA: Insufficient documentation

## 2021-12-13 DIAGNOSIS — Z7982 Long term (current) use of aspirin: Secondary | ICD-10-CM | POA: Diagnosis not present

## 2021-12-13 DIAGNOSIS — Z8673 Personal history of transient ischemic attack (TIA), and cerebral infarction without residual deficits: Secondary | ICD-10-CM | POA: Diagnosis not present

## 2021-12-13 DIAGNOSIS — R634 Abnormal weight loss: Secondary | ICD-10-CM | POA: Diagnosis not present

## 2021-12-13 DIAGNOSIS — I10 Essential (primary) hypertension: Secondary | ICD-10-CM | POA: Diagnosis not present

## 2021-12-13 DIAGNOSIS — C3431 Malignant neoplasm of lower lobe, right bronchus or lung: Secondary | ICD-10-CM

## 2021-12-13 DIAGNOSIS — F17210 Nicotine dependence, cigarettes, uncomplicated: Secondary | ICD-10-CM | POA: Diagnosis not present

## 2021-12-13 DIAGNOSIS — M1990 Unspecified osteoarthritis, unspecified site: Secondary | ICD-10-CM | POA: Insufficient documentation

## 2021-12-13 DIAGNOSIS — M25551 Pain in right hip: Secondary | ICD-10-CM | POA: Diagnosis not present

## 2021-12-13 DIAGNOSIS — I7300 Raynaud's syndrome without gangrene: Secondary | ICD-10-CM | POA: Diagnosis not present

## 2021-12-13 DIAGNOSIS — R590 Localized enlarged lymph nodes: Secondary | ICD-10-CM

## 2021-12-13 DIAGNOSIS — C7951 Secondary malignant neoplasm of bone: Secondary | ICD-10-CM | POA: Diagnosis present

## 2021-12-13 DIAGNOSIS — J449 Chronic obstructive pulmonary disease, unspecified: Secondary | ICD-10-CM | POA: Insufficient documentation

## 2021-12-17 ENCOUNTER — Ambulatory Visit: Payer: Medicare Other | Admitting: Radiation Oncology

## 2021-12-18 ENCOUNTER — Ambulatory Visit
Admission: RE | Admit: 2021-12-18 | Discharge: 2021-12-18 | Disposition: A | Discharge status: Home / Self Care | Payer: Medicare Other | Source: Ambulatory Visit | Attending: Radiation Oncology | Admitting: Radiation Oncology

## 2021-12-18 DIAGNOSIS — C7800 Secondary malignant neoplasm of unspecified lung: Secondary | ICD-10-CM | POA: Insufficient documentation

## 2021-12-18 DIAGNOSIS — Z8541 Personal history of malignant neoplasm of cervix uteri: Secondary | ICD-10-CM | POA: Diagnosis not present

## 2021-12-18 DIAGNOSIS — R599 Enlarged lymph nodes, unspecified: Secondary | ICD-10-CM | POA: Diagnosis not present

## 2021-12-18 DIAGNOSIS — C7951 Secondary malignant neoplasm of bone: Secondary | ICD-10-CM | POA: Diagnosis present

## 2021-12-18 DIAGNOSIS — Z79899 Other long term (current) drug therapy: Secondary | ICD-10-CM | POA: Insufficient documentation

## 2021-12-18 DIAGNOSIS — G4733 Obstructive sleep apnea (adult) (pediatric): Secondary | ICD-10-CM | POA: Insufficient documentation

## 2021-12-18 DIAGNOSIS — Z510 Encounter for antineoplastic radiation therapy: Secondary | ICD-10-CM | POA: Insufficient documentation

## 2021-12-18 DIAGNOSIS — Z5112 Encounter for antineoplastic immunotherapy: Secondary | ICD-10-CM | POA: Insufficient documentation

## 2021-12-18 DIAGNOSIS — I2510 Atherosclerotic heart disease of native coronary artery without angina pectoris: Secondary | ICD-10-CM | POA: Diagnosis not present

## 2021-12-18 DIAGNOSIS — Z5111 Encounter for antineoplastic chemotherapy: Secondary | ICD-10-CM | POA: Insufficient documentation

## 2021-12-18 DIAGNOSIS — D61818 Other pancytopenia: Secondary | ICD-10-CM | POA: Insufficient documentation

## 2021-12-18 DIAGNOSIS — F17210 Nicotine dependence, cigarettes, uncomplicated: Secondary | ICD-10-CM | POA: Insufficient documentation

## 2021-12-18 DIAGNOSIS — R590 Localized enlarged lymph nodes: Secondary | ICD-10-CM

## 2021-12-18 DIAGNOSIS — I10 Essential (primary) hypertension: Secondary | ICD-10-CM | POA: Insufficient documentation

## 2021-12-18 DIAGNOSIS — C3431 Malignant neoplasm of lower lobe, right bronchus or lung: Secondary | ICD-10-CM | POA: Insufficient documentation

## 2021-12-18 DIAGNOSIS — F419 Anxiety disorder, unspecified: Secondary | ICD-10-CM | POA: Diagnosis not present

## 2021-12-19 DIAGNOSIS — Z510 Encounter for antineoplastic radiation therapy: Secondary | ICD-10-CM | POA: Diagnosis not present

## 2021-12-20 ENCOUNTER — Other Ambulatory Visit: Payer: Self-pay

## 2021-12-20 ENCOUNTER — Encounter: Payer: Self-pay | Admitting: Physician Assistant

## 2021-12-20 ENCOUNTER — Inpatient Hospital Stay: Payer: Medicare Other

## 2021-12-20 ENCOUNTER — Inpatient Hospital Stay (HOSPITAL_BASED_OUTPATIENT_CLINIC_OR_DEPARTMENT_OTHER): Payer: Medicare Other | Admitting: Internal Medicine

## 2021-12-20 ENCOUNTER — Ambulatory Visit
Admission: RE | Admit: 2021-12-20 | Discharge: 2021-12-20 | Disposition: A | Discharge status: Home / Self Care | Payer: Medicare Other | Source: Ambulatory Visit | Attending: Radiation Oncology | Admitting: Radiation Oncology

## 2021-12-20 VITALS — BP 138/80 | HR 91 | Temp 97.4°F | Resp 19 | Ht 61.0 in | Wt 181.5 lb

## 2021-12-20 DIAGNOSIS — G4733 Obstructive sleep apnea (adult) (pediatric): Secondary | ICD-10-CM | POA: Insufficient documentation

## 2021-12-20 DIAGNOSIS — Z8541 Personal history of malignant neoplasm of cervix uteri: Secondary | ICD-10-CM | POA: Insufficient documentation

## 2021-12-20 DIAGNOSIS — I2510 Atherosclerotic heart disease of native coronary artery without angina pectoris: Secondary | ICD-10-CM | POA: Insufficient documentation

## 2021-12-20 DIAGNOSIS — Z5112 Encounter for antineoplastic immunotherapy: Secondary | ICD-10-CM

## 2021-12-20 DIAGNOSIS — F419 Anxiety disorder, unspecified: Secondary | ICD-10-CM | POA: Insufficient documentation

## 2021-12-20 DIAGNOSIS — C7800 Secondary malignant neoplasm of unspecified lung: Secondary | ICD-10-CM | POA: Insufficient documentation

## 2021-12-20 DIAGNOSIS — C3431 Malignant neoplasm of lower lobe, right bronchus or lung: Secondary | ICD-10-CM | POA: Insufficient documentation

## 2021-12-20 DIAGNOSIS — C3491 Malignant neoplasm of unspecified part of right bronchus or lung: Secondary | ICD-10-CM

## 2021-12-20 DIAGNOSIS — Z79899 Other long term (current) drug therapy: Secondary | ICD-10-CM | POA: Insufficient documentation

## 2021-12-20 DIAGNOSIS — I10 Essential (primary) hypertension: Secondary | ICD-10-CM | POA: Insufficient documentation

## 2021-12-20 DIAGNOSIS — C7951 Secondary malignant neoplasm of bone: Secondary | ICD-10-CM

## 2021-12-20 DIAGNOSIS — F17210 Nicotine dependence, cigarettes, uncomplicated: Secondary | ICD-10-CM | POA: Insufficient documentation

## 2021-12-20 DIAGNOSIS — Z5111 Encounter for antineoplastic chemotherapy: Secondary | ICD-10-CM | POA: Insufficient documentation

## 2021-12-20 DIAGNOSIS — D61818 Other pancytopenia: Secondary | ICD-10-CM | POA: Insufficient documentation

## 2021-12-20 DIAGNOSIS — R918 Other nonspecific abnormal finding of lung field: Secondary | ICD-10-CM

## 2021-12-20 DIAGNOSIS — R599 Enlarged lymph nodes, unspecified: Secondary | ICD-10-CM | POA: Insufficient documentation

## 2021-12-20 DIAGNOSIS — Z510 Encounter for antineoplastic radiation therapy: Secondary | ICD-10-CM | POA: Diagnosis not present

## 2021-12-20 DIAGNOSIS — R590 Localized enlarged lymph nodes: Secondary | ICD-10-CM

## 2021-12-20 LAB — CBC WITH DIFFERENTIAL (CANCER CENTER ONLY)
Abs Immature Granulocytes: 0.05 10*3/uL (ref 0.00–0.07)
Basophils Absolute: 0 10*3/uL (ref 0.0–0.1)
Basophils Relative: 0 %
Eosinophils Absolute: 0.1 10*3/uL (ref 0.0–0.5)
Eosinophils Relative: 1 %
HCT: 31.5 % — ABNORMAL LOW (ref 36.0–46.0)
Hemoglobin: 9.8 g/dL — ABNORMAL LOW (ref 12.0–15.0)
Immature Granulocytes: 1 %
Lymphocytes Relative: 11 %
Lymphs Abs: 1 10*3/uL (ref 0.7–4.0)
MCH: 25.4 pg — ABNORMAL LOW (ref 26.0–34.0)
MCHC: 31.1 g/dL (ref 30.0–36.0)
MCV: 81.6 fL (ref 80.0–100.0)
Monocytes Absolute: 0.7 10*3/uL (ref 0.1–1.0)
Monocytes Relative: 8 %
Neutro Abs: 7.1 10*3/uL (ref 1.7–7.7)
Neutrophils Relative %: 79 %
Platelet Count: 370 10*3/uL (ref 150–400)
RBC: 3.86 MIL/uL — ABNORMAL LOW (ref 3.87–5.11)
RDW: 27 % — ABNORMAL HIGH (ref 11.5–15.5)
WBC Count: 8.9 10*3/uL (ref 4.0–10.5)
nRBC: 0 % (ref 0.0–0.2)

## 2021-12-20 LAB — CMP (CANCER CENTER ONLY)
ALT: 17 U/L (ref 0–44)
AST: 16 U/L (ref 15–41)
Albumin: 3 g/dL — ABNORMAL LOW (ref 3.5–5.0)
Alkaline Phosphatase: 160 U/L — ABNORMAL HIGH (ref 38–126)
Anion gap: 8 (ref 5–15)
BUN: 11 mg/dL (ref 8–23)
CO2: 29 mmol/L (ref 22–32)
Calcium: 9.5 mg/dL (ref 8.9–10.3)
Chloride: 98 mmol/L (ref 98–111)
Creatinine: 0.56 mg/dL (ref 0.44–1.00)
GFR, Estimated: >60 mL/min (ref >60)
Glucose, Bld: 124 mg/dL — ABNORMAL HIGH (ref 70–99)
Potassium: 4.3 mmol/L (ref 3.5–5.1)
Sodium: 135 mmol/L (ref 135–145)
Total Bilirubin: 0.3 mg/dL (ref 0.3–1.2)
Total Protein: 6.5 g/dL (ref 6.5–8.1)

## 2021-12-20 LAB — RAD ONC ARIA SESSION SUMMARY

## 2021-12-20 MED ORDER — LIDOCAINE-PRILOCAINE 2.5-2.5 % EX CREA: TOPICAL_CREAM | CUTANEOUS | 0 refills | Status: DC

## 2021-12-20 MED ORDER — PROCHLORPERAZINE MALEATE 10 MG PO TABS: 10.0000 mg | ORAL_TABLET | Freq: Four times a day (QID) | ORAL | 0 refills | Status: DC | PRN

## 2021-12-20 NOTE — Progress Notes (Signed)
[place] Telephone:(336) [phone]   [phone]  CONSULT NOTE  REFERRING PHYSICIAN: Dr. Kara Mead  REASON FOR CONSULTATION:  71 years old white female recently diagnosed with lung cancer.  HPI [name] is a 71 y.o. female with past medical history significant for multiple medical problems including history of coronary artery disease, hypertension, COPD, anxiety, cervical cancer 30 years ago, prediabetes, obstructive sleep apnea, and anxiety as well as degenerative disc disease and long history of smoking.  The patient has CT screening of the lung performed on 04/30/2021 and it showed right lower lobe pulmonary nodule that is new with a volume drive that equivalent diameter of 7.5 mm.  In September 2023 she was evaluated for suspicious pneumonia and chest x-ray at that time showed airspace opacity in the superior segment of the right lower lobe.  Her symptoms were getting worse and the patient was seen by Dr. Elsworth Soho complaining of cough and chest pain and repeat chest x-ray on 10/22/2021 showed persistent right hilar prominence with similar to mildly improved nodular area of consolidation in the right midlung and the findings suspicious for infectious process but underlying mass/right hilar adenopathy was not excluded.  A CT scan of the chest with contrast on 11/08/2021 was performed and that showed new infrahilar masslike consolidation of the superior segment of the right lower lobe measuring 5.3 x 4.1 cm.  The scan also showed small right pleural effusion with associated enter lobular septal thickening and pleural nodularity.  There was newly enlarged mediastinal and right hilar lymph nodes.  These findings were suspicious for primary lung malignancy with nodal, pleural and lymphangitic metastatic disease.  The patient was seen by Dr. Elsworth Soho and on November 21, 2021 she underwent video bronchoscopy with bronchial brushing and alveolar lavage as well as endobronchial  biopsies with EBUS.  The final pathology [id number] ) from the right bronchus intermedius as well as right upper lobe was consistent with small cell carcinoma.  The immunostaining showed patchy weak positivity for synaptophysin, punctate positivity for cytokeratin 7, and strong diffuse positivity for TTF-1 support the diagnosis.  Ki-67 immunostaining shows a high proliferation index.  The patient had a PET scan on December 12, 2021 and it showed large right lower lobe lung mass markedly hypermetabolic consistent with primary lung neoplasm.  There was extensive interstitial spread of tumor throughout the right lower lobe.  There was metastatic right hilar and mediastinal adenopathy.  There was also pulmonary metastatic nodules in addition to diffuse osseous metastatic disease involving lytic lesion in the lateral mass of C1, lytic sternal mass, left scapular spine lesion with appearance of small pathologic fracture.  There was also numerous rib and vertebral body lesions including left-sided T12 lesion and left iliac bone lesion as well as right sacral lesion. The patient was referred to me today for evaluation and recommendation regarding treatment of her condition. When seen today she continues to complain of back pain especially when laying on the table for the radiotherapy.  She has no chest pain, shortness of breath but continues to have cough with no hemoptysis.  She also has some weakness in her legs.  She has dry heaves and she lost around 30 pounds in the last 4 months.  She has intermittent headache with no visual changes. Family history significant for mother with hypertension and dyslipidemia.  Father had hypertension and heart disease. The patient is married and has 1 daughter and several grandchildren.  She worked in several jobs including  retails.  She was accompanied today by her husband [name].  She has a history of smoking up to 2 packs/day for around 64 years and she still smoking less than  a pack a day.  She has no history of alcohol or drug abuse. HPI  Past Medical History:  Diagnosis Date   Anxiety    Aortic atherosclerosis ([place])    Bilateral cataracts    Carpal tunnel syndrome    Cervical cancer (HCC)    Chronic back pain    Claudication (HCC)    COPD (chronic obstructive pulmonary disease) (HCC)    wheezing   DDD (degenerative disc disease), cervical    DDD (degenerative disc disease), lumbar    Depression    Diverticulosis    Dyspnea    on exertion   Gastroparesis    GERD (gastroesophageal reflux disease)    Hearing loss    Bilateral   History of blood transfusion    History of colon polyps    History of migraine    Hypertension    Internal hemorrhoids    Iron deficiency anemia    Leg swelling    Liver disease    pt unaware   Lung nodule    several small   OA (osteoarthritis)    both hands   Pneumonia    Positive ANA (antinuclear antibody)    Pre-diabetes    Raynaud's phenomenon    Restless leg    Seizures ([place])    no meds for 8 months   Sleep apnea    does not use her cpap   Spondylosis    Stroke ([place]) 2015   can't wear lower dentures because jaw wouldn't line up after the stroke   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   rt   Urge incontinence of urine    Varicose vein of leg    Venous insufficiency     Past Surgical History:  Procedure Laterality Date   BACK SURGERY     BIOPSY  08/31/2021   Procedure: BIOPSY;  Surgeon: Thornton Park, MD;  Location: [facility] ENDOSCOPY;  Service: Gastroenterology;;   BLEPHAROPLASTY  10/10/2017   BRONCHIAL BIOPSY  11/21/2021   Procedure: BRONCHIAL BIOPSIES;  Surgeon: Rigoberto Noel, MD;  Location: [facility] ENDOSCOPY;  Service: Cardiopulmonary;;   BRONCHIAL BRUSHINGS  11/21/2021   Procedure: BRONCHIAL BRUSHINGS;  Surgeon: Rigoberto Noel, MD;  Location: [facility] ENDOSCOPY;  Service: Cardiopulmonary;;   BRONCHIAL NEEDLE ASPIRATION BIOPSY  11/21/2021   Procedure: BRONCHIAL NEEDLE ASPIRATION BIOPSIES;  Surgeon: Rigoberto Noel,  MD;  Location: [place] ENDOSCOPY;  Service: Cardiopulmonary;;   BRONCHIAL WASHINGS  11/21/2021   Procedure: BRONCHIAL WASHINGS;  Surgeon: Rigoberto Noel, MD;  Location: [place];  Service: Cardiopulmonary;;   CATARACT EXTRACTION, BILATERAL     CESAREAN SECTION     CHOLECYSTECTOMY     COLONOSCOPY  10/2016   COLONOSCOPY WITH PROPOFOL N/A 08/31/2021   Procedure: COLONOSCOPY WITH PROPOFOL;  Surgeon: Thornton Park, MD;  Location: [place];  Service: Gastroenterology;  Laterality: N/A;   ESOPHAGOGASTRODUODENOSCOPY (EGD) WITH PROPOFOL N/A 08/31/2021   Procedure: ESOPHAGOGASTRODUODENOSCOPY (EGD) WITH PROPOFOL;  Surgeon: Thornton Park, MD;  Location: [place];  Service: Gastroenterology;  Laterality: N/A;   HAND SURGERY Bilateral    carpal tunnel   HEMORRHOID SURGERY     HEMOSTASIS CONTROL  11/21/2021   Procedure: HEMOSTASIS CONTROL;  Surgeon: Rigoberto Noel, MD;  Location: [place];  Service: Cardiopulmonary;;   INTERSTIM IMPLANT PLACEMENT     KNEE SURGERY Left  arthroscopy x3   LOWER EXTREMITY ANGIOGRAPHY N/A 07/16/2016   Procedure: Lower Extremity Angiography;  Surgeon: Adrian Prows, MD;  Location: [place] CV LAB;  Service: Cardiovascular;  Laterality: N/A;   LOWER EXTREMITY INTERVENTION N/A 08/06/2016   Procedure: Lower Extremity Intervention;  Surgeon: Adrian Prows, MD;  Location: [place] CV LAB;  Service: Cardiovascular;  Laterality: N/A;   NECK SURGERY     OTHER SURGICAL HISTORY  2013   bladder stimulator in back   PERIPHERAL VASCULAR BALLOON ANGIOPLASTY  08/06/2016   Procedure: Peripheral Vascular Balloon Angioplasty;  Surgeon: Adrian Prows, MD;  Location: [place] CV LAB;  Service: Cardiovascular;;  Right SFA   POLYPECTOMY  08/31/2021   Procedure: POLYPECTOMY;  Surgeon: Thornton Park, MD;  Location: [facility] ENDOSCOPY;  Service: Gastroenterology;;   TENNIS ELBOW RELEASE/NIRSCHEL PROCEDURE Right 12/2015   TOE SURGERY Right    right foot second and fourth toe   TOTAL HIP  ARTHROPLASTY Left    TOTAL HIP REVISION Left 10/13/2017   Procedure: LEFT TOTAL HIP REVISION ACETABULUM, OPEN REDUCTION INTERNAL WITH BONE GRAFT FIXATION LEFT GREATER TROCHANTERIC FRACTURE;  Surgeon: Frederik Pear, MD;  Location: [facility] ORS;  Service: Orthopedics;  Laterality: Left;   TOTAL KNEE ARTHROPLASTY Right 01/12/2016   Procedure: RIGHT TOTAL KNEE ARTHROPLASTY;  Surgeon: Netta Cedars, MD;  Location: [place];  Service: Orthopedics;  Laterality: Right;   TUBAL LIGATION     UPPER GI ENDOSCOPY  10/2016   VAGINAL HYSTERECTOMY     VIDEO BRONCHOSCOPY WITH ENDOBRONCHIAL ULTRASOUND N/A 11/21/2021   Procedure: VIDEO BRONCHOSCOPY WITH ENDOBRONCHIAL ULTRASOUND;  Surgeon: Rigoberto Noel, MD;  Location: [place];  Service: Cardiopulmonary;  Laterality: N/A;    Family History  Problem Relation Age of Onset   Hyperlipidemia Mother    Hypertension Mother    Anxiety disorder Mother    Depression Mother    Hyperlipidemia Father    Hypertension Father    Alzheimer's disease Father    Anxiety disorder Brother    Cancer Daughter        unknown   Anxiety disorder Brother    Anxiety disorder Brother    Drug abuse Brother    Stroke Brother    Colon cancer Neg Hx    Esophageal cancer Neg Hx    Rectal cancer Neg Hx    Stomach cancer Neg Hx     Social History Social History   Tobacco Use   Smoking status: Every Day    Packs/day: 0.50    Years: 55.00    Total pack years: 27.50    Types: Cigarettes    Passive exposure: Past   Smokeless tobacco: Never   Tobacco comments:    Pt states she smokes a half pack daily  Vaping Use   Vaping Use: Never used  Substance Use Topics   Alcohol use: No    Alcohol/week: 0.0 standard drinks of alcohol   Drug use: Never    Allergies  Allergen Reactions   Bee Venom Anaphylaxis   Morphine And Related Other (See Comments)    Overly sedated   Neurontin [Gabapentin] Other (See Comments)    Nightmares    Codeine Hives   Adhesive [Tape] Itching and Other  (See Comments)    Redness, Paper tape is ok   Sulfa Antibiotics Other (See Comments)    Childhood allergy Unknown reaction   Sulfonamide Derivatives Other (See Comments)    Childhood allergy Unknown reaction    Current Outpatient Medications  Medication Sig Dispense Refill  acetaminophen (TYLENOL) 325 MG tablet Take 650 mg by mouth 2 (two) times daily as needed for headache.     albuterol (PROVENTIL) (2.5 MG/3ML) 0.083% nebulizer solution Inhale 3 mLs into the lungs every 4 (four) hours as needed for wheezing or shortness of breath (cough). (Patient taking differently: Inhale 3 mLs into the lungs 2 (two) times daily as needed for wheezing or shortness of breath (cough).) 75 mL 12   albuterol (VENTOLIN HFA) 108 (90 Base) MCG/ACT inhaler Inhale 2 puffs into the lungs 2 (two) times daily as needed for wheezing or shortness of breath.     aspirin EC 81 MG tablet Take 1 tablet (81 mg total) by mouth daily. Swallow whole. (Patient taking differently: Take 81 mg by mouth daily as needed (if do not take acetaminophen). Swallow whole.) 150 tablet 2   benzonatate (TESSALON) 100 MG capsule Take 1 capsule (100 mg total) by mouth 2 (two) times daily as needed for cough. (Patient not taking: Reported on 11/19/2021) 20 capsule 0   busPIRone (BUSPAR) 5 MG tablet Take 1 tablet (5 mg total) by mouth 2 (two) times daily. 60 tablet 0   Cholecalciferol (VITAMIN D-3 PO) Take 2,000 Units by mouth daily.     cyanocobalamin (VITAMIN B12) 1000 MCG/ML injection Inject 1,000 mcg into the muscle every 30 (thirty) days.     docusate sodium (COLACE) 100 MG capsule Take 200 mg by mouth daily as needed (constipation).     DULoxetine (CYMBALTA) 60 MG capsule Take 60 mg by mouth at bedtime.     ferrous sulfate 324 MG TBEC Take 324 mg by mouth.     hydrocortisone (ANUSOL-HC) 25 MG suppository Place 1 suppository (25 mg total) rectally 2 (two) times daily as needed for hemorrhoids or anal itching. 12 suppository 0   linaclotide  (LINZESS) 290 MCG CAPS capsule Take 1 capsule (290 mcg total) by mouth daily before breakfast. (Patient taking differently: Take 290 mcg by mouth daily as needed (Constipation).) 30 capsule 1   losartan (COZAAR) 25 MG tablet Take 25 mg by mouth daily.     methocarbamol (ROBAXIN) 500 MG tablet Take 1 tablet (500 mg total) by mouth every 6 (six) hours as needed for muscle spasms. 60 tablet 0   metoCLOPramide (REGLAN) 5 MG tablet Take 1 tablet by mouth 3 times daily 20-30 minutes before meals NO FURTHER REFILLS UNTIL SEEN, NEEDS AND APPOINTMENT (Patient taking differently: Take 5 mg by mouth 3 (three) times daily before meals.) 90 tablet 0   nitroGLYCERIN (NITROSTAT) 0.4 MG SL tablet Place 0.4 mg under the tongue every 5 (five) minutes x 3 doses as needed for chest pain.  1   Oxycodone HCl 10 MG TABS Take 10 mg by mouth 5 (five) times daily as needed (for moderate pain).     pantoprazole (PROTONIX) 40 MG tablet Take 40 mg by mouth daily as needed (acid reflux).     predniSONE (DELTASONE) 20 MG tablet Take 1 tablet (20 mg total) by mouth daily with breakfast. 5 tablet 0   rOPINIRole (REQUIP) 1 MG tablet Take 1 mg by mouth 2 (two) times daily.     rosuvastatin (CRESTOR) 10 MG tablet Take 10 mg by mouth at bedtime.     senna (SENOKOT) 8.6 MG tablet Take 17.2 mg by mouth daily as needed for constipation.     Tiotropium Bromide-Olodaterol (STIOLTO RESPIMAT) 2.5-2.5 MCG/ACT AERS Inhale 2 puffs into the lungs daily. (Patient not taking: Reported on 12/13/2021) 1 each 5   Tiotropium  Bromide-Olodaterol (STIOLTO RESPIMAT) 2.5-2.5 MCG/ACT AERS Inhale 2 puffs into the lungs daily. (Patient not taking: Reported on 11/19/2021) 4 g 0   traZODone (DESYREL) 50 MG tablet TAKE 0.5-1 TABLETS (25-50 MG TOTAL) BY MOUTH AT BEDTIME AS NEEDED FOR SLEEP. (Patient taking differently: Take 50 mg by mouth at bedtime.) 90 tablet 1   No current facility-administered medications for this visit.    Review of Systems  Constitutional:  positive for anorexia, fatigue, and weight loss Eyes: negative Ears, nose, mouth, throat, and face: negative Respiratory: positive for cough Cardiovascular: negative Gastrointestinal: positive for nausea Genitourinary:negative Integument/breast: negative Hematologic/lymphatic: negative Musculoskeletal:positive for back pain Neurological: positive for headaches Behavioral/Psych: negative Endocrine: negative Allergic/Immunologic: negative  Physical Exam  FUX:NATFT, healthy, no distress, well nourished, and well developed SKIN: skin color, texture, turgor are normal, no rashes or significant lesions HEAD: Normocephalic, No masses, lesions, tenderness or abnormalities EYES: normal, PERRLA, Conjunctiva are pink and non-injected EARS: External ears normal, Canals clear OROPHARYNX:no exudate, no erythema, and lips, buccal mucosa, and tongue normal  NECK: supple, no adenopathy, no JVD LYMPH:  no palpable lymphadenopathy, no hepatosplenomegaly BREAST:not examined LUNGS: clear to auscultation , and palpation HEART: regular rate & rhythm, no murmurs, and no gallops ABDOMEN:abdomen soft, non-tender, normal bowel sounds, and no masses or organomegaly BACK: Back symmetric, no curvature., No CVA tenderness EXTREMITIES:no joint deformities, effusion, or inflammation, no edema  NEURO: alert & oriented x 3 with fluent speech, no focal motor/sensory deficits  PERFORMANCE STATUS: ECOG 1  LABORATORY DATA: Lab Results  Component Value Date   WBC 8.9 12/20/2021   HGB 9.8 (L) 12/20/2021   HCT 31.5 (L) 12/20/2021   MCV 81.6 12/20/2021   PLT 370 12/20/2021      Chemistry      Component Value Date/Time   NA 134 (L) 11/21/2021 0810   NA 140 09/28/2019 0948   K 4.0 11/21/2021 0810   K 3.5 03/19/2011 0722   CL 99 11/21/2021 0810   CO2 22 11/21/2021 0810   BUN 14 11/21/2021 0810   BUN 12 09/28/2019 0948   CREATININE 0.68 11/21/2021 0810      Component Value Date/Time   CALCIUM 9.2  11/21/2021 0810   ALKPHOS 110 09/30/2021 1532   AST 19 09/30/2021 1532   ALT 13 09/30/2021 1532   BILITOT 0.2 (L) 09/30/2021 1532   BILITOT <0.2 09/28/2019 0948       RADIOGRAPHIC STUDIES: NM PET Image Initial (PI) Skull Base To Thigh  Result Date: 12/13/2021 CLINICAL DATA:  Initial treatment strategy for right lower lobe lung mass. EXAM: NUCLEAR MEDICINE PET SKULL BASE TO THIGH TECHNIQUE: 9.14 mCi F-18 FDG was injected intravenously. Full-ring PET imaging was performed from the skull base to thigh after the radiotracer. CT data was obtained and used for attenuation correction and anatomic localization. Fasting blood glucose: 113 mg/dl COMPARISON:  Chest CT 11/08/2021 FINDINGS: Mediastinal blood pool activity: SUV max 2.96 Liver activity: SUV max NA NECK: No hypermetabolic lymph nodes in the neck. Incidental CT findings: None. CHEST: Large central right lower lobe lung mass is hypermetabolic with SUV max of 73.22. Associated significant surrounding obstructive pneumonitis or more likely interstitial spread of tumor throughout the right lower lobe. Hypermetabolic mediastinal lymphadenopathy. The 22 mm right paratracheal node has an SUV max of 14.88. Right hilar adenopathy has an SUV max of 12.34. Subcarinal adenopathy has an SUV max of 15.31. Multiple metastatic pulmonary nodules again demonstrated. Incidental CT findings: Stable vascular disease with three-vessel coronary artery calcifications. Stable emphysematous  changes and pulmonary scarring. ABDOMEN/PELVIS: No abnormal hypermetabolic activity within the liver, pancreas, adrenal glands, or spleen. No hypermetabolic lymph nodes in the abdomen or pelvis. No mass in the left external iliac region is adjacent to a total left hip arthroplasty with extensive artifact. Moderate hypermetabolism with SUV max of 6.73. Think this is unlikely metastatic and more likely some type of chronic inflammatory process. Incidental CT findings: Advanced sigmoid colon  diverticulosis. Advanced vascular disease but no aneurysm. A sacral nerve stimulator is noted. SKELETON: Diffuse hypermetabolic osseous metastatic disease. Lytic lesion involving the lateral mass of C1 has an SUV max of 9.93. Lytic sternal mass has an SUV max of 12.35. Left scapular spine lesion has an SUV max of 8.85 and there appears to be a small pathologic fracture. Numerous rib and vertebral body lesions . Left-sided T12 lesion has an SUV max of 14.20. No obvious canal involvement. Left iliac bone lesion has an SUV max of 13.84. Right sacral lesion has an SUV max of 14.15. Incidental CT findings: None. IMPRESSION: 1. Large right lower lobe lung mass is markedly hypermetabolic and consistent with primary lung neoplasm. Extensive interstitial spread of tumor throughout the right lower lobe favored over obstructive pneumonitis. 2. Metastatic right hilar and mediastinal adenopathy. 3. Pulmonary metastatic nodules. 4. No findings for abdominal/pelvic metastatic disease. 5. Diffuse osseous metastatic disease. 6. Hypermetabolic nodal mass near the left hip is most likely inflammatory. Electronically Signed   By: Marijo Sanes M.D.   On: 12/13/2021 11:13   DG CHEST PORT 1 VIEW  Result Date: 11/21/2021 CLINICAL DATA:  Status post bronchoscopy EXAM: PORTABLE CHEST 1 VIEW COMPARISON:  Previous studies including radiographs done on 10/22/2021 and CT done on 11/08/2021 FINDINGS: Cardiac size is within normal limits. There is increased density overlying the right hilum corresponding to a masslike consolidation seen in the previous CT. There is subtle increase in interstitial markings in right mid and right lower lung fields. Left lung is clear. There is no pleural effusion or pneumothorax. There is previous surgical fusion in cervical spine. IMPRESSION: There is abnormal prominence of right hilum suggesting possible central neoplastic process. Increased interstitial markings are seen in right parahilar region and right  lower lung fields suggesting possible interstitial pneumonia. There is no pleural effusion or pneumothorax. Electronically Signed   By: Elmer Picker M.D.   On: 11/21/2021 15:29    ASSESSMENT: This is a very pleasant 71 years old white female recently diagnosed with extensive stage (T4, N2, M1 C) small cell lung cancer presented with large right lower lobe consolidative mass in addition to right hilar and mediastinal lymphadenopathy and extensive bone metastasis diagnosed in November 2023.   PLAN: I had a lengthy discussion with the patient and her husband today about her current disease stage, prognosis and treatment options. I personally and independently reviewed the scan images and discussed results and showed the images to the patient and her husband. I recommended for the patient to complete the staging workup by having the MRI of the brain performed next week as planned. I explained to the patient that she has incurable condition and all the treatment will be of palliative nature. The patient understands that without treatment her average survival is around 3 months but with the treatment she may have a median survival of 1 year. I gave the patient the option of palliative care and hospice versus consideration of palliative systemic chemotherapy with carboplatin for AUC of 5 on day 1, etoposide 100 Mg/M2 on days 1,  2 and 3 with Cosela 240 Mg/M2 on the days of the chemotherapy as well as Imfinzi 1500 mg IV on day 1 every 3 weeks during the induction phase and then every 4 weeks during the maintenance if there is no evidence for disease progression. I discussed with the patient the adverse effect of the chemotherapy including but not limited to alopecia, myelosuppression, nausea and vomiting, peripheral neuropathy, liver or renal dysfunction as well as immunotherapy adverse effects. She is interested in proceeding with the treatment and she is expected to start the first dose of this treatment  next week. She will have a chemotherapy education class before the first dose of her treatment. I will refer the patient to interventional neurology for consideration of Port-A-Cath placement. I will call her pharmacy with prescription for Compazine 10 mg p.o. every 6 hours as needed for nausea in addition to Emla cream to be applied to the Port-A-Cath site before treatment. The patient will come back for follow-up visit 1 week after the treatment for evaluation and management of any adverse effect of her chemotherapy. For the shortness of breath and the metastatic bone disease, the patient is seen by Dr. Sondra Come and currently undergoing palliative radiotherapy to the obstructive lung mass but she may also benefit from palliative radiotherapy to the C1 lesion as well as some of the other painful metastatic lesions in the skeleton. The patient was advised to call immediately if she has any other concerning symptoms in the interval.  The patient voices understanding of current disease status and treatment options and is in agreement with the current care plan.  All questions were answered. The patient knows to call the clinic with any problems, questions or concerns. We can certainly see the patient much sooner if necessary.  Thank you so much for allowing me to participate in the care of [name]. I will continue to follow up the patient with you and assist in her care.  The total time spent in the appointment was 90 minutes.  Disclaimer: This note was dictated with voice recognition software. Similar sounding words can inadvertently be transcribed and may not be corrected upon review.   Eilleen Kempf December 20, 2021, 1:43 PM

## 2021-12-20 NOTE — Progress Notes (Signed)
START ON PATHWAY REGIMEN - Small Cell Lung     Cycles 1 through 4: A cycle is every 21 days:     Durvalumab      Carboplatin      Etoposide    Cycles 5 and beyond: A cycle is every 28 days:     Durvalumab   **Always confirm dose/schedule in your pharmacy ordering system**  Patient Characteristics: Newly Diagnosed, Preoperative or Nonsurgical Candidate (Clinical Staging), First Line, Extensive Stage Therapeutic Status: Newly Diagnosed, Preoperative or Nonsurgical Candidate (Clinical Staging) AJCC T Category: cT4 AJCC N Category: cN2 AJCC M Category: cM1c AJCC 8 Stage Grouping: IVB Stage Classification: Extensive  Intent of Therapy: Non-Curative / Palliative Intent, Discussed with Patient

## 2021-12-21 ENCOUNTER — Ambulatory Visit
Admission: RE | Admit: 2021-12-21 | Discharge: 2021-12-21 | Disposition: A | Discharge status: Home / Self Care | Payer: Medicare Other | Source: Ambulatory Visit | Attending: Radiation Oncology | Admitting: Radiation Oncology

## 2021-12-21 ENCOUNTER — Other Ambulatory Visit: Payer: Self-pay

## 2021-12-21 DIAGNOSIS — Z510 Encounter for antineoplastic radiation therapy: Secondary | ICD-10-CM | POA: Diagnosis not present

## 2021-12-21 LAB — RAD ONC ARIA SESSION SUMMARY

## 2021-12-21 LAB — FUNGUS CULTURE WITH STAIN

## 2021-12-21 LAB — FUNGAL ORGANISM REFLEX

## 2021-12-21 LAB — FUNGUS CULTURE RESULT

## 2021-12-23 ENCOUNTER — Other Ambulatory Visit: Payer: Self-pay

## 2021-12-24 ENCOUNTER — Other Ambulatory Visit: Payer: Self-pay

## 2021-12-24 ENCOUNTER — Ambulatory Visit
Admission: RE | Admit: 2021-12-24 | Discharge: 2021-12-24 | Disposition: A | Discharge status: Home / Self Care | Payer: Medicare Other | Source: Ambulatory Visit | Attending: Radiation Oncology | Admitting: Radiation Oncology

## 2021-12-24 ENCOUNTER — Telehealth: Payer: Self-pay | Admitting: Internal Medicine

## 2021-12-24 DIAGNOSIS — Z510 Encounter for antineoplastic radiation therapy: Secondary | ICD-10-CM | POA: Diagnosis not present

## 2021-12-24 LAB — RAD ONC ARIA SESSION SUMMARY
Course Elapsed Days: 4
Plan Fractions Treated to Date: 3
Plan Fractions Treated to Date: 3
Plan Prescribed Dose Per Fraction: 3 Gy
Plan Prescribed Dose Per Fraction: 3 Gy
Plan Total Fractions Prescribed: 10
Plan Total Fractions Prescribed: 10
Plan Total Prescribed Dose: 30 Gy
Plan Total Prescribed Dose: 30 Gy
Reference Point Dosage Given to Date: 9 Gy
Reference Point Dosage Given to Date: 9 Gy
Reference Point Session Dosage Given: 3 Gy
Reference Point Session Dosage Given: 3 Gy
Session Number: 3

## 2021-12-24 NOTE — Telephone Encounter (Signed)
Scheduled per 12/07 los, spoke to patient's daughter regarding upcoming appointments. Patient will be notified.

## 2021-12-25 ENCOUNTER — Ambulatory Visit
Admission: RE | Admit: 2021-12-25 | Discharge: 2021-12-25 | Disposition: A | Discharge status: Home / Self Care | Payer: Medicare Other | Source: Ambulatory Visit | Attending: Radiation Oncology | Admitting: Radiation Oncology

## 2021-12-25 ENCOUNTER — Other Ambulatory Visit: Payer: Self-pay

## 2021-12-25 DIAGNOSIS — Z510 Encounter for antineoplastic radiation therapy: Secondary | ICD-10-CM | POA: Diagnosis not present

## 2021-12-25 LAB — RAD ONC ARIA SESSION SUMMARY

## 2021-12-25 MED FILL — Dexamethasone Sodium Phosphate Inj 100 MG/10ML: INTRAMUSCULAR | Qty: 1 | Status: AC

## 2021-12-25 MED FILL — Fosaprepitant Dimeglumine For IV Infusion 150 MG (Base Eq): INTRAVENOUS | Qty: 5 | Status: AC

## 2021-12-26 ENCOUNTER — Inpatient Hospital Stay: Payer: Medicare Other

## 2021-12-26 ENCOUNTER — Ambulatory Visit
Admission: RE | Admit: 2021-12-26 | Discharge: 2021-12-26 | Disposition: A | Discharge status: Home / Self Care | Payer: Medicare Other | Source: Ambulatory Visit | Attending: Radiation Oncology | Admitting: Radiation Oncology

## 2021-12-26 ENCOUNTER — Other Ambulatory Visit: Payer: Self-pay

## 2021-12-26 VITALS — BP 113/87 | HR 77 | Temp 98.5°F | Resp 18

## 2021-12-26 DIAGNOSIS — Z510 Encounter for antineoplastic radiation therapy: Secondary | ICD-10-CM | POA: Diagnosis not present

## 2021-12-26 DIAGNOSIS — C3491 Malignant neoplasm of unspecified part of right bronchus or lung: Secondary | ICD-10-CM

## 2021-12-26 LAB — RAD ONC ARIA SESSION SUMMARY

## 2021-12-26 LAB — CBC WITH DIFFERENTIAL (CANCER CENTER ONLY)
Abs Immature Granulocytes: 0.06 10*3/uL (ref 0.00–0.07)
Basophils Absolute: 0 10*3/uL (ref 0.0–0.1)
Basophils Relative: 0 %
Eosinophils Absolute: 0 10*3/uL (ref 0.0–0.5)
Eosinophils Relative: 0 %
HCT: 29.8 % — ABNORMAL LOW (ref 36.0–46.0)
Hemoglobin: 9.2 g/dL — ABNORMAL LOW (ref 12.0–15.0)
Immature Granulocytes: 1 %
Lymphocytes Relative: 9 %
Lymphs Abs: 0.6 10*3/uL — ABNORMAL LOW (ref 0.7–4.0)
MCH: 25.6 pg — ABNORMAL LOW (ref 26.0–34.0)
MCHC: 30.9 g/dL (ref 30.0–36.0)
MCV: 82.8 fL (ref 80.0–100.0)
Monocytes Absolute: 0.4 10*3/uL (ref 0.1–1.0)
Monocytes Relative: 6 %
Neutro Abs: 5.9 10*3/uL (ref 1.7–7.7)
Neutrophils Relative %: 84 %
Platelet Count: 317 10*3/uL (ref 150–400)
RBC: 3.6 MIL/uL — ABNORMAL LOW (ref 3.87–5.11)
RDW: 26.4 % — ABNORMAL HIGH (ref 11.5–15.5)
WBC Count: 7 10*3/uL (ref 4.0–10.5)
nRBC: 0 % (ref 0.0–0.2)

## 2021-12-26 LAB — CMP (CANCER CENTER ONLY)
ALT: 19 U/L (ref 0–44)
AST: 23 U/L (ref 15–41)
Albumin: 3 g/dL — ABNORMAL LOW (ref 3.5–5.0)
Alkaline Phosphatase: 133 U/L — ABNORMAL HIGH (ref 38–126)
Anion gap: 7 (ref 5–15)
BUN: 10 mg/dL (ref 8–23)
CO2: 31 mmol/L (ref 22–32)
Calcium: 9.4 mg/dL (ref 8.9–10.3)
Chloride: 96 mmol/L — ABNORMAL LOW (ref 98–111)
Creatinine: 0.54 mg/dL (ref 0.44–1.00)
GFR, Estimated: >60 mL/min (ref >60)
Glucose, Bld: 100 mg/dL — ABNORMAL HIGH (ref 70–99)
Potassium: 4 mmol/L (ref 3.5–5.1)
Sodium: 134 mmol/L — ABNORMAL LOW (ref 135–145)
Total Bilirubin: 0.3 mg/dL (ref 0.3–1.2)
Total Protein: 6.4 g/dL — ABNORMAL LOW (ref 6.5–8.1)

## 2021-12-26 LAB — TSH: TSH: 0.688 u[IU]/mL (ref 0.350–4.500)

## 2021-12-26 MED ORDER — TRILACICLIB DIHYDROCHLORIDE INJECTION 300 MG
240.0000 mg/m2 | Freq: Once | INTRAVENOUS | Status: AC
  Administered 2021-12-26: 450 mg via INTRAVENOUS
  Filled 2021-12-26: qty 30

## 2021-12-26 MED ORDER — SODIUM CHLORIDE 0.9 % IV SOLN
150.0000 mg | Freq: Once | INTRAVENOUS | Status: AC
  Administered 2021-12-26: 150 mg via INTRAVENOUS
  Filled 2021-12-26: qty 150

## 2021-12-26 MED ORDER — SODIUM CHLORIDE 0.9 % IV SOLN
100.0000 mg/m2 | Freq: Once | INTRAVENOUS | Status: AC
  Administered 2021-12-26: 190 mg via INTRAVENOUS
  Filled 2021-12-26: qty 9.5

## 2021-12-26 MED ORDER — SODIUM CHLORIDE 0.9 % IV SOLN
455.5000 mg | Freq: Once | INTRAVENOUS | Status: AC
  Administered 2021-12-26: 460 mg via INTRAVENOUS
  Filled 2021-12-26: qty 46

## 2021-12-26 MED ORDER — SODIUM CHLORIDE 0.9 % IV SOLN: Freq: Once | INTRAVENOUS | Status: AC

## 2021-12-26 MED ORDER — PALONOSETRON HCL INJECTION 0.25 MG/5ML
0.2500 mg | Freq: Once | INTRAVENOUS | Status: AC
  Administered 2021-12-26: 0.25 mg via INTRAVENOUS
  Filled 2021-12-26: qty 5

## 2021-12-26 MED ORDER — SODIUM CHLORIDE 0.9 % IV SOLN
10.0000 mg | Freq: Once | INTRAVENOUS | Status: AC
  Administered 2021-12-26: 10 mg via INTRAVENOUS
  Filled 2021-12-26: qty 10

## 2021-12-26 MED ORDER — SODIUM CHLORIDE 0.9 % IV SOLN
1500.0000 mg | Freq: Once | INTRAVENOUS | Status: AC
  Administered 2021-12-26: 1500 mg via INTRAVENOUS
  Filled 2021-12-26: qty 30

## 2021-12-26 MED FILL — Dexamethasone Sodium Phosphate Inj 100 MG/10ML: INTRAMUSCULAR | Qty: 1 | Status: AC

## 2021-12-26 NOTE — Patient Instructions (Signed)
[facility]  Discharge Instructions: Thank you for choosing [facility] to provide your oncology and hematology care.   If you have a lab appointment with the [facility], please go directly to the [facility] and check in at the registration area.   Wear comfortable clothing and clothing appropriate for easy access to any Portacath or PICC line.   We strive to give you quality time with your provider. You may need to reschedule your appointment if you arrive late (15 or more minutes).  Arriving late affects you and other patients whose appointments are after yours.  Also, if you miss three or more appointments without notifying the office, you may be dismissed from the clinic at the provider's discretion.      For prescription refill requests, have your pharmacy contact our office and allow 72 hours for refills to be completed.    Today you received the following chemotherapy and/or immunotherapy agents Cosela, Imfinzi, carboplatin, Etoposide      To help prevent nausea and vomiting after your treatment, we encourage you to take your nausea medication as directed.  BELOW ARE SYMPTOMS THAT SHOULD BE REPORTED IMMEDIATELY: *FEVER GREATER THAN 100.4 F (38 C) OR HIGHER *CHILLS OR SWEATING *NAUSEA AND VOMITING THAT IS NOT CONTROLLED WITH YOUR NAUSEA MEDICATION *UNUSUAL SHORTNESS OF BREATH *UNUSUAL BRUISING OR BLEEDING *URINARY PROBLEMS (pain or burning when urinating, or frequent urination) *BOWEL PROBLEMS (unusual diarrhea, constipation, pain near the anus) TENDERNESS IN MOUTH AND THROAT WITH OR WITHOUT PRESENCE OF ULCERS (sore throat, sores in mouth, or a toothache) UNUSUAL RASH, SWELLING OR PAIN  UNUSUAL VAGINAL DISCHARGE OR ITCHING   Items with * indicate a potential emergency and should be followed up as soon as possible or go to the Emergency Department if any problems should occur.  Please show the CHEMOTHERAPY ALERT CARD or  IMMUNOTHERAPY ALERT CARD at check-in to the Emergency Department and triage nurse.  Should you have questions after your visit or need to cancel or reschedule your appointment, please contact [facility]  Dept: [phone]  and follow the prompts.  Office hours are 8:00 a.m. to 4:30 p.m. Monday - Friday. Please note that voicemails left after 4:00 p.m. may not be returned until the following business day.  We are closed weekends and major holidays. You have access to a nurse at all times for urgent questions. Please call the main number to the clinic Dept: [phone] and follow the prompts.   For any non-urgent questions, you may also contact your provider using MyChart. We now offer e-Visits for anyone 16 and older to request care online for non-urgent symptoms. For details visit [email or web address].   Also download the MyChart app! Go to the app store, search "MyChart", open the app, select [facility], and log in with your MyChart username and password.  Masks are optional in the cancer centers. If you would like for your care team to wear a mask while they are taking care of you, please let them know. You may have one support person who is at least 72 years old accompany you for your appointments.

## 2021-12-27 ENCOUNTER — Encounter: Payer: Self-pay | Admitting: Adult Health

## 2021-12-27 ENCOUNTER — Inpatient Hospital Stay: Payer: Medicare Other

## 2021-12-27 ENCOUNTER — Other Ambulatory Visit: Payer: Self-pay

## 2021-12-27 ENCOUNTER — Ambulatory Visit: Payer: Medicare Other | Admitting: Adult Health

## 2021-12-27 ENCOUNTER — Ambulatory Visit
Admission: RE | Admit: 2021-12-27 | Discharge: 2021-12-27 | Disposition: A | Discharge status: Home / Self Care | Payer: Medicare Other | Source: Ambulatory Visit | Attending: Radiation Oncology | Admitting: Radiation Oncology

## 2021-12-27 VITALS — BP 110/60 | HR 79 | Temp 97.6°F | Ht 63.0 in | Wt 185.6 lb

## 2021-12-27 VITALS — BP 123/59 | HR 81 | Temp 97.6°F | Resp 18

## 2021-12-27 DIAGNOSIS — Z720 Tobacco use: Secondary | ICD-10-CM | POA: Diagnosis not present

## 2021-12-27 DIAGNOSIS — J9611 Chronic respiratory failure with hypoxia: Secondary | ICD-10-CM | POA: Diagnosis not present

## 2021-12-27 DIAGNOSIS — C3491 Malignant neoplasm of unspecified part of right bronchus or lung: Secondary | ICD-10-CM | POA: Diagnosis not present

## 2021-12-27 DIAGNOSIS — J449 Chronic obstructive pulmonary disease, unspecified: Secondary | ICD-10-CM | POA: Insufficient documentation

## 2021-12-27 DIAGNOSIS — Z510 Encounter for antineoplastic radiation therapy: Secondary | ICD-10-CM | POA: Diagnosis not present

## 2021-12-27 LAB — RAD ONC ARIA SESSION SUMMARY

## 2021-12-27 MED ORDER — SODIUM CHLORIDE 0.9% FLUSH: 10.0000 mL | INTRAVENOUS | Status: DC | PRN

## 2021-12-27 MED ORDER — TRILACICLIB DIHYDROCHLORIDE INJECTION 300 MG
240.0000 mg/m2 | Freq: Once | INTRAVENOUS | Status: AC
  Administered 2021-12-27: 450 mg via INTRAVENOUS
  Filled 2021-12-27: qty 30

## 2021-12-27 MED ORDER — SODIUM CHLORIDE 0.9 % IV SOLN
10.0000 mg | Freq: Once | INTRAVENOUS | Status: AC
  Administered 2021-12-27: 10 mg via INTRAVENOUS
  Filled 2021-12-27: qty 10

## 2021-12-27 MED ORDER — HEPARIN SOD (PORK) LOCK FLUSH 100 UNIT/ML IV SOLN: 500.0000 [IU] | Freq: Once | INTRAVENOUS | Status: DC | PRN

## 2021-12-27 MED ORDER — SODIUM CHLORIDE 0.9 % IV SOLN
100.0000 mg/m2 | Freq: Once | INTRAVENOUS | Status: AC
  Administered 2021-12-27: 190 mg via INTRAVENOUS
  Filled 2021-12-27: qty 9.5

## 2021-12-27 MED ORDER — SODIUM CHLORIDE 0.9 % IV SOLN: Freq: Once | INTRAVENOUS | Status: AC

## 2021-12-27 MED FILL — Dexamethasone Sodium Phosphate Inj 100 MG/10ML: INTRAMUSCULAR | Qty: 1 | Status: AC

## 2021-12-27 NOTE — Progress Notes (Signed)
Per Julien Nordmann MD, ok to treat with elevated pulse of 110 today.

## 2021-12-27 NOTE — Assessment & Plan Note (Addendum)
Extensive stage small cell lung cancer-undergoing concurrent palliative chemo and radiation.  Continue follow-up with oncology. MRI brain pending

## 2021-12-27 NOTE — Patient Instructions (Addendum)
Albuterol inhaler or neb As needed   Work on not smoking  We can reschedule the Split night study when able .  Continue on Oxygen 2l/m as needed., goal is to keep O2 sats >88-90%.  Follow up with Dr. Elsworth Soho  in 6 weeks and As needed   Please contact office for sooner follow up if symptoms do not improve or worsen or seek emergency care

## 2021-12-27 NOTE — Patient Instructions (Signed)
[facility]  Discharge Instructions: Thank you for choosing [facility] to provide your oncology and hematology care.   If you have a lab appointment with the Cancer Center, please go directly to the Cancer Center and check in at the registration area.   Wear comfortable clothing and clothing appropriate for easy access to any Portacath or PICC line.   We strive to give you quality time with your provider. You may need to reschedule your appointment if you arrive late (15 or more minutes).  Arriving late affects you and other patients whose appointments are after yours.  Also, if you miss three or more appointments without notifying the office, you may be dismissed from the clinic at the provider's discretion.      For prescription refill requests, have your pharmacy contact our office and allow 72 hours for refills to be completed.    Today you received the following chemotherapy and/or immunotherapy agents: Etoposide      To help prevent nausea and vomiting after your treatment, we encourage you to take your nausea medication as directed.  BELOW ARE SYMPTOMS THAT SHOULD BE REPORTED IMMEDIATELY: *FEVER GREATER THAN 100.4 F (38 C) OR HIGHER *CHILLS OR SWEATING *NAUSEA AND VOMITING THAT IS NOT CONTROLLED WITH YOUR NAUSEA MEDICATION *UNUSUAL SHORTNESS OF BREATH *UNUSUAL BRUISING OR BLEEDING *URINARY PROBLEMS (pain or burning when urinating, or frequent urination) *BOWEL PROBLEMS (unusual diarrhea, constipation, pain near the anus) TENDERNESS IN MOUTH AND THROAT WITH OR WITHOUT PRESENCE OF ULCERS (sore throat, sores in mouth, or a toothache) UNUSUAL RASH, SWELLING OR PAIN  UNUSUAL VAGINAL DISCHARGE OR ITCHING   Items with * indicate a potential emergency and should be followed up as soon as possible or go to the Emergency Department if any problems should occur.  Please show the CHEMOTHERAPY ALERT CARD or IMMUNOTHERAPY ALERT CARD at check-in to  the Emergency Department and triage nurse.  Should you have questions after your visit or need to cancel or reschedule your appointment, please contact [facility]  Dept: [phone]  and follow the prompts.  Office hours are 8:00 a.m. to 4:30 p.m. Monday - Friday. Please note that voicemails left after 4:00 p.m. may not be returned until the following business day.  We are closed weekends and major holidays. You have access to a nurse at all times for urgent questions. Please call the main number to the clinic Dept: [phone] and follow the prompts.   For any non-urgent questions, you may also contact your provider using MyChart. We now offer e-Visits for anyone 18 and older to request care online for non-urgent symptoms. For details visit [email or web address].   Also download the MyChart app! Go to the app store, search "MyChart", open the app, select , and log in with your MyChart username and password.  Masks are optional in the cancer centers. If you would like for your care team to wear a mask while they are taking care of you, please let them know. You may have one support person who is at least 72 years old accompany you for your appointments. 

## 2021-12-27 NOTE — Assessment & Plan Note (Signed)
COPD appears stable.  Did not have perceived benefit on Stiolto.  For now use albuterol inhaler and nebulizer as needed. Smoking cessation encouraged  Plan  Patient Instructions  Albuterol inhaler or neb As needed   Work on not smoking  We can reschedule the Split night study when able .  Continue on Oxygen 2l/m as needed., goal is to keep O2 sats >88-90%.  Follow up with Dr. Elsworth Soho  in 6 weeks and As needed   Please contact office for sooner follow up if symptoms do not improve or worsen or seek emergency care
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
## 2021-12-27 NOTE — Assessment & Plan Note (Signed)
Smoking cessation  

## 2021-12-27 NOTE — Assessment & Plan Note (Signed)
Continue on oxygen to maintain O2 saturations greater than 88 to 90%. 

## 2021-12-28 ENCOUNTER — Inpatient Hospital Stay: Payer: Medicare Other

## 2021-12-28 ENCOUNTER — Encounter: Payer: Self-pay | Admitting: Internal Medicine

## 2021-12-28 ENCOUNTER — Other Ambulatory Visit: Payer: Self-pay

## 2021-12-28 ENCOUNTER — Other Ambulatory Visit: Payer: Self-pay | Admitting: Physician Assistant

## 2021-12-28 ENCOUNTER — Ambulatory Visit
Admission: RE | Admit: 2021-12-28 | Discharge: 2021-12-28 | Disposition: A | Discharge status: Home / Self Care | Payer: Medicare Other | Source: Ambulatory Visit | Attending: Radiation Oncology | Admitting: Radiation Oncology

## 2021-12-28 VITALS — BP 141/68 | HR 102 | Temp 98.0°F | Resp 17

## 2021-12-28 DIAGNOSIS — Z510 Encounter for antineoplastic radiation therapy: Secondary | ICD-10-CM | POA: Diagnosis not present

## 2021-12-28 DIAGNOSIS — F40240 Claustrophobia: Secondary | ICD-10-CM

## 2021-12-28 DIAGNOSIS — C3491 Malignant neoplasm of unspecified part of right bronchus or lung: Secondary | ICD-10-CM

## 2021-12-28 LAB — RAD ONC ARIA SESSION SUMMARY

## 2021-12-28 LAB — T4: T4, Total: 7.4 ug/dL (ref 4.5–12.0)

## 2021-12-28 MED ORDER — SODIUM CHLORIDE 0.9 % IV SOLN
100.0000 mg/m2 | Freq: Once | INTRAVENOUS | Status: AC
  Administered 2021-12-28: 190 mg via INTRAVENOUS
  Filled 2021-12-28: qty 9.5

## 2021-12-28 MED ORDER — TRILACICLIB DIHYDROCHLORIDE INJECTION 300 MG
240.0000 mg/m2 | Freq: Once | INTRAVENOUS | Status: AC
  Administered 2021-12-28: 450 mg via INTRAVENOUS
  Filled 2021-12-28: qty 30

## 2021-12-28 MED ORDER — LORAZEPAM 0.5 MG PO TABS: ORAL_TABLET | ORAL | 0 refills | Status: DC

## 2021-12-28 MED ORDER — SODIUM CHLORIDE 0.9 % IV SOLN: Freq: Once | INTRAVENOUS | Status: AC

## 2021-12-28 MED ORDER — SODIUM CHLORIDE 0.9 % IV SOLN
10.0000 mg | Freq: Once | INTRAVENOUS | Status: AC
  Administered 2021-12-28: 10 mg via INTRAVENOUS
  Filled 2021-12-28: qty 10

## 2021-12-28 NOTE — Progress Notes (Signed)
Per Dr [name] ok to proceed with elevated HR and BP today

## 2021-12-28 NOTE — Progress Notes (Signed)
Called pt to introduce myself as her Financial Resource Specialist and to discuss the [employer] grant.  I left a msg requesting she return my call if she's interested in applying for the grant.  ?

## 2021-12-30 ENCOUNTER — Other Ambulatory Visit: Payer: Self-pay

## 2021-12-30 ENCOUNTER — Ambulatory Visit
Admission: RE | Admit: 2021-12-30 | Discharge: 2021-12-30 | Disposition: A | Discharge status: Home / Self Care | Payer: Medicare Other | Source: Ambulatory Visit | Attending: Adult Health | Admitting: Adult Health

## 2021-12-30 DIAGNOSIS — C3490 Malignant neoplasm of unspecified part of unspecified bronchus or lung: Secondary | ICD-10-CM

## 2021-12-31 ENCOUNTER — Other Ambulatory Visit: Payer: Self-pay

## 2021-12-31 ENCOUNTER — Ambulatory Visit
Admission: RE | Admit: 2021-12-31 | Discharge: 2021-12-31 | Disposition: A | Discharge status: Home / Self Care | Payer: Medicare Other | Source: Ambulatory Visit | Attending: Radiation Oncology | Admitting: Radiation Oncology

## 2021-12-31 DIAGNOSIS — Z510 Encounter for antineoplastic radiation therapy: Secondary | ICD-10-CM | POA: Diagnosis not present

## 2021-12-31 LAB — RAD ONC ARIA SESSION SUMMARY
Course Elapsed Days: 11
Plan Fractions Treated to Date: 8
Plan Fractions Treated to Date: 8
Plan Prescribed Dose Per Fraction: 3 Gy
Plan Prescribed Dose Per Fraction: 3 Gy
Plan Total Fractions Prescribed: 10
Plan Total Fractions Prescribed: 10
Plan Total Prescribed Dose: 30 Gy
Plan Total Prescribed Dose: 30 Gy
Reference Point Dosage Given to Date: 24 Gy
Reference Point Dosage Given to Date: 24 Gy
Reference Point Session Dosage Given: 3 Gy
Reference Point Session Dosage Given: 3 Gy
Session Number: 8

## 2022-01-01 ENCOUNTER — Ambulatory Visit
Admission: RE | Admit: 2022-01-01 | Discharge: 2022-01-01 | Disposition: A | Discharge status: Home / Self Care | Payer: Medicare Other | Source: Ambulatory Visit | Attending: Radiation Oncology | Admitting: Radiation Oncology

## 2022-01-01 ENCOUNTER — Other Ambulatory Visit: Payer: Self-pay

## 2022-01-01 DIAGNOSIS — Z510 Encounter for antineoplastic radiation therapy: Secondary | ICD-10-CM | POA: Diagnosis not present

## 2022-01-01 LAB — RAD ONC ARIA SESSION SUMMARY

## 2022-01-02 ENCOUNTER — Other Ambulatory Visit: Payer: Self-pay

## 2022-01-02 ENCOUNTER — Ambulatory Visit
Admission: RE | Admit: 2022-01-02 | Discharge: 2022-01-02 | Disposition: A | Discharge status: Home / Self Care | Payer: Medicare Other | Source: Ambulatory Visit | Attending: Radiation Oncology | Admitting: Radiation Oncology

## 2022-01-02 ENCOUNTER — Encounter: Payer: Self-pay | Admitting: Radiation Oncology

## 2022-01-02 DIAGNOSIS — Z510 Encounter for antineoplastic radiation therapy: Secondary | ICD-10-CM | POA: Diagnosis not present

## 2022-01-02 LAB — RAD ONC ARIA SESSION SUMMARY

## 2022-01-08 LAB — ACID FAST CULTURE WITH REFLEXED SENSITIVITIES (MYCOBACTERIA): Acid Fast Culture: NEGATIVE

## 2022-01-10 ENCOUNTER — Other Ambulatory Visit: Payer: Self-pay

## 2022-01-10 DIAGNOSIS — C3491 Malignant neoplasm of unspecified part of right bronchus or lung: Secondary | ICD-10-CM
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
## 2022-01-11 ENCOUNTER — Inpatient Hospital Stay: Payer: Medicare Other

## 2022-01-11 ENCOUNTER — Telehealth: Payer: Self-pay | Admitting: Internal Medicine

## 2022-01-11 ENCOUNTER — Other Ambulatory Visit: Payer: Self-pay

## 2022-01-11 ENCOUNTER — Inpatient Hospital Stay (HOSPITAL_BASED_OUTPATIENT_CLINIC_OR_DEPARTMENT_OTHER): Payer: Medicare Other | Admitting: Physician Assistant

## 2022-01-11 VITALS — BP 141/75 | HR 94 | Temp 97.7°F | Resp 18

## 2022-01-11 VITALS — BP 137/62 | HR 110 | Temp 97.4°F | Resp 18 | Wt 179.6 lb

## 2022-01-11 DIAGNOSIS — E876 Hypokalemia: Secondary | ICD-10-CM

## 2022-01-11 DIAGNOSIS — C3491 Malignant neoplasm of unspecified part of right bronchus or lung: Secondary | ICD-10-CM

## 2022-01-11 DIAGNOSIS — D61818 Other pancytopenia: Secondary | ICD-10-CM

## 2022-01-11 DIAGNOSIS — C801 Malignant (primary) neoplasm, unspecified: Secondary | ICD-10-CM | POA: Diagnosis not present

## 2022-01-11 DIAGNOSIS — Z510 Encounter for antineoplastic radiation therapy: Secondary | ICD-10-CM | POA: Diagnosis not present

## 2022-01-11 DIAGNOSIS — D649 Anemia, unspecified: Secondary | ICD-10-CM

## 2022-01-11 LAB — URINALYSIS, COMPLETE (UACMP) WITH MICROSCOPIC
Bilirubin Urine: NEGATIVE
Glucose, UA: NEGATIVE mg/dL
Hgb urine dipstick: NEGATIVE
Ketones, ur: NEGATIVE mg/dL
Nitrite: NEGATIVE
Protein, ur: NEGATIVE mg/dL
Specific Gravity, Urine: 1.01 (ref 1.005–1.030)
pH: 6 (ref 5.0–8.0)

## 2022-01-11 LAB — COMPREHENSIVE METABOLIC PANEL
ALT: 9 U/L (ref 0–44)
AST: 8 U/L — ABNORMAL LOW (ref 15–41)
Albumin: 3 g/dL — ABNORMAL LOW (ref 3.5–5.0)
Alkaline Phosphatase: 150 U/L — ABNORMAL HIGH (ref 38–126)
Anion gap: 7 (ref 5–15)
BUN: 9 mg/dL (ref 8–23)
CO2: 31 mmol/L (ref 22–32)
Calcium: 8.2 mg/dL — ABNORMAL LOW (ref 8.9–10.3)
Chloride: 97 mmol/L — ABNORMAL LOW (ref 98–111)
Creatinine, Ser: 0.71 mg/dL (ref 0.44–1.00)
GFR, Estimated: >60 mL/min (ref >60)
Glucose, Bld: 143 mg/dL — ABNORMAL HIGH (ref 70–99)
Potassium: 3.1 mmol/L — ABNORMAL LOW (ref 3.5–5.1)
Sodium: 135 mmol/L (ref 135–145)
Total Bilirubin: 0.4 mg/dL (ref 0.3–1.2)
Total Protein: 5.9 g/dL — ABNORMAL LOW (ref 6.5–8.1)

## 2022-01-11 LAB — CBC WITH DIFFERENTIAL/PLATELET
Abs Immature Granulocytes: 0.02 10*3/uL (ref 0.00–0.07)
Basophils Absolute: 0 10*3/uL (ref 0.0–0.1)
Basophils Relative: 1 %
Eosinophils Absolute: 0.3 10*3/uL (ref 0.0–0.5)
Eosinophils Relative: 17 %
HCT: 21.5 % — ABNORMAL LOW (ref 36.0–46.0)
Hemoglobin: 7.2 g/dL — ABNORMAL LOW (ref 12.0–15.0)
Immature Granulocytes: 1 %
Lymphocytes Relative: 25 %
Lymphs Abs: 0.4 10*3/uL — ABNORMAL LOW (ref 0.7–4.0)
MCH: 27.3 pg (ref 26.0–34.0)
MCHC: 33.5 g/dL (ref 30.0–36.0)
MCV: 81.4 fL (ref 80.0–100.0)
Monocytes Absolute: 0.2 10*3/uL (ref 0.1–1.0)
Monocytes Relative: 15 %
Neutro Abs: 0.6 10*3/uL — ABNORMAL LOW (ref 1.7–7.7)
Neutrophils Relative %: 41 %
Platelets: 100 10*3/uL — ABNORMAL LOW (ref 150–400)
RBC: 2.64 MIL/uL — ABNORMAL LOW (ref 3.87–5.11)
RDW: 25 % — ABNORMAL HIGH (ref 11.5–15.5)
WBC: 1.6 10*3/uL — ABNORMAL LOW (ref 4.0–10.5)
nRBC: 1.3 % — ABNORMAL HIGH (ref 0.0–0.2)

## 2022-01-11 LAB — PREPARE RBC (CROSSMATCH)

## 2022-01-11 MED ORDER — POTASSIUM CHLORIDE CRYS ER 20 MEQ PO TBCR
60.0000 meq | EXTENDED_RELEASE_TABLET | Freq: Once | ORAL | Status: AC
  Administered 2022-01-11: 60 meq via ORAL
  Filled 2022-01-11: qty 3

## 2022-01-11 MED ORDER — AMOXICILLIN-POT CLAVULANATE 875-125 MG PO TABS: 1.0000 | ORAL_TABLET | Freq: Two times a day (BID) | ORAL | 0 refills | Status: AC

## 2022-01-11 MED ORDER — SODIUM CHLORIDE 0.9% IV SOLUTION
250.0000 mL | Freq: Once | INTRAVENOUS | Status: AC
  Administered 2022-01-11: 250 mL via INTRAVENOUS

## 2022-01-11 MED ORDER — POTASSIUM CHLORIDE CRYS ER 20 MEQ PO TBCR: 20.0000 meq | EXTENDED_RELEASE_TABLET | Freq: Two times a day (BID) | ORAL | 0 refills | Status: DC

## 2022-01-11 MED ORDER — CIPROFLOXACIN HCL 250 MG PO TABS: 750.0000 mg | ORAL_TABLET | Freq: Two times a day (BID) | ORAL | 0 refills | Status: AC

## 2022-01-11 MED ORDER — DIPHENHYDRAMINE HCL 25 MG PO CAPS
25.0000 mg | ORAL_CAPSULE | Freq: Once | ORAL | Status: AC
  Administered 2022-01-11: 25 mg via ORAL
  Filled 2022-01-11: qty 1

## 2022-01-11 MED ORDER — ACETAMINOPHEN 325 MG PO TABS
650.0000 mg | ORAL_TABLET | Freq: Once | ORAL | Status: AC
  Administered 2022-01-11: 650 mg via ORAL
  Filled 2022-01-11: qty 2

## 2022-01-11 MED ORDER — LORAZEPAM 0.5 MG PO TABS: 0.5000 mg | ORAL_TABLET | Freq: Once | ORAL | 0 refills | Status: AC

## 2022-01-11 NOTE — Patient Instructions (Signed)

## 2022-01-11 NOTE — Telephone Encounter (Signed)
Called patient regarding upcoming January and February appointments, left a voicemail.

## 2022-01-12 LAB — BPAM RBC
Blood Product Expiration Date: 202401302359
ISSUE DATE / TIME: 202312291150
Unit Type and Rh: 5100

## 2022-01-12 LAB — TYPE AND SCREEN
ABO/RH(D): O POS
Antibody Screen: NEGATIVE
Unit division: 0

## 2022-01-13 LAB — URINE CULTURE: Culture: 20000 — AB

## 2022-01-14 ENCOUNTER — Other Ambulatory Visit: Payer: Self-pay | Admitting: Hematology & Oncology

## 2022-01-14 MED ORDER — DIAZEPAM 5 MG PO TABS: 5.0000 mg | ORAL_TABLET | Freq: Once | ORAL | 0 refills | Status: AC

## 2022-01-15 ENCOUNTER — Ambulatory Visit
Admission: RE | Admit: 2022-01-15 | Discharge: 2022-01-15 | Disposition: A | Discharge status: Home / Self Care | Payer: Medicare Other | Source: Ambulatory Visit | Attending: Adult Health | Admitting: Adult Health

## 2022-01-15 MED ORDER — GADOPICLENOL 0.5 MMOL/ML IV SOLN
10.0000 mL | Freq: Once | INTRAVENOUS | Status: AC | PRN
  Administered 2022-01-15: 10 mL via INTRAVENOUS

## 2022-01-15 MED FILL — Fosaprepitant Dimeglumine For IV Infusion 150 MG (Base Eq): INTRAVENOUS | Qty: 5 | Status: AC

## 2022-01-15 MED FILL — Dexamethasone Sodium Phosphate Inj 100 MG/10ML: INTRAMUSCULAR | Qty: 1 | Status: AC
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
## 2022-01-16 ENCOUNTER — Inpatient Hospital Stay: Payer: Medicare Other | Attending: Internal Medicine

## 2022-01-16 ENCOUNTER — Inpatient Hospital Stay: Payer: Medicare Other

## 2022-01-16 ENCOUNTER — Other Ambulatory Visit: Payer: Self-pay

## 2022-01-16 ENCOUNTER — Other Ambulatory Visit: Payer: Medicare Other

## 2022-01-16 ENCOUNTER — Other Ambulatory Visit: Payer: Self-pay | Admitting: Radiology

## 2022-01-16 ENCOUNTER — Encounter: Payer: Self-pay | Admitting: Internal Medicine

## 2022-01-16 ENCOUNTER — Inpatient Hospital Stay (HOSPITAL_BASED_OUTPATIENT_CLINIC_OR_DEPARTMENT_OTHER): Payer: Medicare Other | Admitting: Internal Medicine

## 2022-01-16 VITALS — BP 142/72 | HR 108 | Temp 97.6°F | Resp 16 | Wt 181.5 lb

## 2022-01-16 VITALS — HR 103

## 2022-01-16 DIAGNOSIS — C3491 Malignant neoplasm of unspecified part of right bronchus or lung: Secondary | ICD-10-CM

## 2022-01-16 DIAGNOSIS — C3431 Malignant neoplasm of lower lobe, right bronchus or lung: Secondary | ICD-10-CM | POA: Insufficient documentation

## 2022-01-16 DIAGNOSIS — D6481 Anemia due to antineoplastic chemotherapy: Secondary | ICD-10-CM | POA: Diagnosis not present

## 2022-01-16 DIAGNOSIS — Z79899 Other long term (current) drug therapy: Secondary | ICD-10-CM | POA: Diagnosis not present

## 2022-01-16 DIAGNOSIS — C7951 Secondary malignant neoplasm of bone: Secondary | ICD-10-CM | POA: Insufficient documentation

## 2022-01-16 DIAGNOSIS — C3490 Malignant neoplasm of unspecified part of unspecified bronchus or lung: Secondary | ICD-10-CM | POA: Diagnosis not present

## 2022-01-16 DIAGNOSIS — T451X5A Adverse effect of antineoplastic and immunosuppressive drugs, initial encounter: Secondary | ICD-10-CM | POA: Insufficient documentation

## 2022-01-16 DIAGNOSIS — Z5111 Encounter for antineoplastic chemotherapy: Secondary | ICD-10-CM | POA: Insufficient documentation

## 2022-01-16 DIAGNOSIS — R079 Chest pain, unspecified: Secondary | ICD-10-CM | POA: Diagnosis not present

## 2022-01-16 LAB — CULTURE, BLOOD (SINGLE)
Culture: NO GROWTH
Culture: NO GROWTH

## 2022-01-16 LAB — CBC WITH DIFFERENTIAL (CANCER CENTER ONLY)
Abs Immature Granulocytes: 0.03 10*3/uL (ref 0.00–0.07)
Basophils Absolute: 0 10*3/uL (ref 0.0–0.1)
Basophils Relative: 0 %
Eosinophils Absolute: 0.2 10*3/uL (ref 0.0–0.5)
Eosinophils Relative: 5 %
HCT: 25.9 % — ABNORMAL LOW (ref 36.0–46.0)
Hemoglobin: 8.7 g/dL — ABNORMAL LOW (ref 12.0–15.0)
Immature Granulocytes: 1 %
Lymphocytes Relative: 16 %
Lymphs Abs: 0.5 10*3/uL — ABNORMAL LOW (ref 0.7–4.0)
MCH: 28.5 pg (ref 26.0–34.0)
MCHC: 33.6 g/dL (ref 30.0–36.0)
MCV: 84.9 fL (ref 80.0–100.0)
Monocytes Absolute: 0.3 10*3/uL (ref 0.1–1.0)
Monocytes Relative: 10 %
Neutro Abs: 2.2 10*3/uL (ref 1.7–7.7)
Neutrophils Relative %: 68 %
Platelet Count: 141 10*3/uL — ABNORMAL LOW (ref 150–400)
RBC: 3.05 MIL/uL — ABNORMAL LOW (ref 3.87–5.11)
RDW: 23.8 % — ABNORMAL HIGH (ref 11.5–15.5)
WBC Count: 3.2 10*3/uL — ABNORMAL LOW (ref 4.0–10.5)
nRBC: 0.6 % — ABNORMAL HIGH (ref 0.0–0.2)

## 2022-01-16 LAB — CMP (CANCER CENTER ONLY)
ALT: 10 U/L (ref 0–44)
AST: 13 U/L — ABNORMAL LOW (ref 15–41)
Albumin: 3.1 g/dL — ABNORMAL LOW (ref 3.5–5.0)
Alkaline Phosphatase: 165 U/L — ABNORMAL HIGH (ref 38–126)
Anion gap: 5 (ref 5–15)
BUN: 7 mg/dL — ABNORMAL LOW (ref 8–23)
CO2: 30 mmol/L (ref 22–32)
Calcium: 8.4 mg/dL — ABNORMAL LOW (ref 8.9–10.3)
Chloride: 101 mmol/L (ref 98–111)
Creatinine: 0.71 mg/dL (ref 0.44–1.00)
GFR, Estimated: >60 mL/min (ref >60)
Glucose, Bld: 108 mg/dL — ABNORMAL HIGH (ref 70–99)
Potassium: 4.6 mmol/L (ref 3.5–5.1)
Sodium: 136 mmol/L (ref 135–145)
Total Bilirubin: 0.3 mg/dL (ref 0.3–1.2)
Total Protein: 5.7 g/dL — ABNORMAL LOW (ref 6.5–8.1)

## 2022-01-16 MED ORDER — SODIUM CHLORIDE 0.9 % IV SOLN
455.5000 mg | Freq: Once | INTRAVENOUS | Status: AC
  Administered 2022-01-16: 460 mg via INTRAVENOUS
  Filled 2022-01-16: qty 46

## 2022-01-16 MED ORDER — SODIUM CHLORIDE 0.9% FLUSH: 10.0000 mL | INTRAVENOUS | Status: DC | PRN

## 2022-01-16 MED ORDER — PALONOSETRON HCL INJECTION 0.25 MG/5ML
0.2500 mg | Freq: Once | INTRAVENOUS | Status: AC
  Administered 2022-01-16: 0.25 mg via INTRAVENOUS
  Filled 2022-01-16: qty 5

## 2022-01-16 MED ORDER — SODIUM CHLORIDE 0.9 % IV SOLN
150.0000 mg | Freq: Once | INTRAVENOUS | Status: AC
  Administered 2022-01-16: 150 mg via INTRAVENOUS
  Filled 2022-01-16: qty 150

## 2022-01-16 MED ORDER — HEPARIN SOD (PORK) LOCK FLUSH 100 UNIT/ML IV SOLN: 500.0000 [IU] | Freq: Once | INTRAVENOUS | Status: DC | PRN

## 2022-01-16 MED ORDER — TRILACICLIB DIHYDROCHLORIDE INJECTION 300 MG
240.0000 mg/m2 | Freq: Once | INTRAVENOUS | Status: AC
  Administered 2022-01-16: 450 mg via INTRAVENOUS
  Filled 2022-01-16: qty 30

## 2022-01-16 MED ORDER — SODIUM CHLORIDE 0.9 % IV SOLN
100.0000 mg/m2 | Freq: Once | INTRAVENOUS | Status: AC
  Administered 2022-01-16: 190 mg via INTRAVENOUS
  Filled 2022-01-16: qty 9.5

## 2022-01-16 MED ORDER — SODIUM CHLORIDE 0.9 % IV SOLN
10.0000 mg | Freq: Once | INTRAVENOUS | Status: AC
  Administered 2022-01-16: 10 mg via INTRAVENOUS
  Filled 2022-01-16: qty 10

## 2022-01-16 MED ORDER — SODIUM CHLORIDE 0.9 % IV SOLN: Freq: Once | INTRAVENOUS | Status: AC

## 2022-01-16 MED ORDER — SODIUM CHLORIDE 0.9 % IV SOLN
1500.0000 mg | Freq: Once | INTRAVENOUS | Status: AC
  Administered 2022-01-16: 1500 mg via INTRAVENOUS
  Filled 2022-01-16: qty 30

## 2022-01-16 MED FILL — Dexamethasone Sodium Phosphate Inj 100 MG/10ML: INTRAMUSCULAR | Qty: 1 | Status: AC

## 2022-01-16 NOTE — Progress Notes (Signed)
Per Dr. Julien Nordmann ,it is okay to treat pt today with Carboplatin , Etoposide and Durvalumab and heart rate of 103.

## 2022-01-16 NOTE — Patient Instructions (Signed)
[facility]  Discharge Instructions: Thank you for choosing [facility] to provide your oncology and hematology care.   If you have a lab appointment with the [facility], please go directly to the [facility] and check in at the registration area.   Wear comfortable clothing and clothing appropriate for easy access to any Portacath or PICC line.   We strive to give you quality time with your provider. You may need to reschedule your appointment if you arrive late (15 or more minutes).  Arriving late affects you and other patients whose appointments are after yours.  Also, if you miss three or more appointments without notifying the office, you may be dismissed from the clinic at the provider's discretion.      For prescription refill requests, have your pharmacy contact our office and allow 72 hours for refills to be completed.    Today you received the following chemotherapy and/or immunotherapy agents: Cosela, Imfinzi, Etoposide, Carboplatin.       To help prevent nausea and vomiting after your treatment, we encourage you to take your nausea medication as directed.  BELOW ARE SYMPTOMS THAT SHOULD BE REPORTED IMMEDIATELY: *FEVER GREATER THAN 100.4 F (38 C) OR HIGHER *CHILLS OR SWEATING *NAUSEA AND VOMITING THAT IS NOT CONTROLLED WITH YOUR NAUSEA MEDICATION *UNUSUAL SHORTNESS OF BREATH *UNUSUAL BRUISING OR BLEEDING *URINARY PROBLEMS (pain or burning when urinating, or frequent urination) *BOWEL PROBLEMS (unusual diarrhea, constipation, pain near the anus) TENDERNESS IN MOUTH AND THROAT WITH OR WITHOUT PRESENCE OF ULCERS (sore throat, sores in mouth, or a toothache) UNUSUAL RASH, SWELLING OR PAIN  UNUSUAL VAGINAL DISCHARGE OR ITCHING   Items with * indicate a potential emergency and should be followed up as soon as possible or go to the Emergency Department if any problems should occur.  Please show the CHEMOTHERAPY ALERT CARD or  IMMUNOTHERAPY ALERT CARD at check-in to the Emergency Department and triage nurse.  Should you have questions after your visit or need to cancel or reschedule your appointment, please contact [facility]  Dept: [phone]  and follow the prompts.  Office hours are 8:00 a.m. to 4:30 p.m. Monday - Friday. Please note that voicemails left after 4:00 p.m. may not be returned until the following business day.  We are closed weekends and major holidays. You have access to a nurse at all times for urgent questions. Please call the main number to the clinic Dept: [phone] and follow the prompts.   For any non-urgent questions, you may also contact your provider using MyChart. We now offer e-Visits for anyone 73 and older to request care online for non-urgent symptoms. For details visit [email or web address].   Also download the MyChart app! Go to the app store, search "MyChart", open the app, select [facility], and log in with your MyChart username and password.
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
## 2022-01-16 NOTE — Progress Notes (Signed)
Per Julien Nordmann MD, ok to treat with elevated HR today

## 2022-01-17 ENCOUNTER — Other Ambulatory Visit: Payer: Self-pay

## 2022-01-17 ENCOUNTER — Ambulatory Visit (HOSPITAL_COMMUNITY)
Admission: RE | Admit: 2022-01-17 | Discharge: 2022-01-17 | Disposition: A | Discharge status: Home / Self Care | Payer: Medicare Other | Source: Ambulatory Visit | Attending: Physician Assistant | Admitting: Physician Assistant

## 2022-01-17 ENCOUNTER — Encounter (HOSPITAL_COMMUNITY): Payer: Self-pay

## 2022-01-17 ENCOUNTER — Inpatient Hospital Stay: Payer: Medicare Other

## 2022-01-17 ENCOUNTER — Other Ambulatory Visit (HOSPITAL_COMMUNITY): Payer: Self-pay

## 2022-01-17 VITALS — BP 151/71 | HR 92 | Temp 98.7°F | Resp 18

## 2022-01-17 DIAGNOSIS — J449 Chronic obstructive pulmonary disease, unspecified: Secondary | ICD-10-CM | POA: Diagnosis not present

## 2022-01-17 DIAGNOSIS — R569 Unspecified convulsions: Secondary | ICD-10-CM | POA: Diagnosis not present

## 2022-01-17 DIAGNOSIS — I2510 Atherosclerotic heart disease of native coronary artery without angina pectoris: Secondary | ICD-10-CM | POA: Insufficient documentation

## 2022-01-17 DIAGNOSIS — C3491 Malignant neoplasm of unspecified part of right bronchus or lung: Secondary | ICD-10-CM | POA: Diagnosis not present

## 2022-01-17 DIAGNOSIS — F32A Depression, unspecified: Secondary | ICD-10-CM | POA: Diagnosis not present

## 2022-01-17 DIAGNOSIS — I10 Essential (primary) hypertension: Secondary | ICD-10-CM | POA: Diagnosis not present

## 2022-01-17 DIAGNOSIS — G4733 Obstructive sleep apnea (adult) (pediatric): Secondary | ICD-10-CM | POA: Diagnosis not present

## 2022-01-17 DIAGNOSIS — F17210 Nicotine dependence, cigarettes, uncomplicated: Secondary | ICD-10-CM | POA: Insufficient documentation

## 2022-01-17 DIAGNOSIS — Z8673 Personal history of transient ischemic attack (TIA), and cerebral infarction without residual deficits: Secondary | ICD-10-CM | POA: Insufficient documentation

## 2022-01-17 DIAGNOSIS — F419 Anxiety disorder, unspecified: Secondary | ICD-10-CM | POA: Insufficient documentation

## 2022-01-17 DIAGNOSIS — Z5111 Encounter for antineoplastic chemotherapy: Secondary | ICD-10-CM | POA: Diagnosis not present

## 2022-01-17 HISTORY — PX: IR IMAGING GUIDED PORT INSERTION: IMG5740

## 2022-01-17 MED ORDER — SODIUM CHLORIDE 0.9 % IV SOLN
100.0000 mg/m2 | Freq: Once | INTRAVENOUS | Status: AC
  Administered 2022-01-17: 190 mg via INTRAVENOUS
  Filled 2022-01-17: qty 9.5

## 2022-01-17 MED ORDER — OXYCODONE HCL 10 MG PO TABS
10.0000 mg | ORAL_TABLET | Freq: Every day | ORAL | 0 refills | Status: DC | PRN
  Filled 2022-01-17: qty 150, 30d supply, fill #0

## 2022-01-17 MED ORDER — MIDAZOLAM HCL 2 MG/2ML IJ SOLN
INTRAMUSCULAR | Status: AC
  Filled 2022-01-17: qty 2

## 2022-01-17 MED ORDER — HEPARIN SOD (PORK) LOCK FLUSH 100 UNIT/ML IV SOLN
INTRAVENOUS | Status: AC
  Filled 2022-01-17: qty 5

## 2022-01-17 MED ORDER — HEPARIN SOD (PORK) LOCK FLUSH 100 UNIT/ML IV SOLN
500.0000 [IU] | Freq: Once | INTRAVENOUS | Status: AC | PRN
  Administered 2022-01-17: 500 [IU]

## 2022-01-17 MED ORDER — LIDOCAINE-EPINEPHRINE 1 %-1:100000 IJ SOLN
INTRAMUSCULAR | Status: AC
  Filled 2022-01-17: qty 1

## 2022-01-17 MED ORDER — FENTANYL CITRATE (PF) 100 MCG/2ML IJ SOLN
INTRAMUSCULAR | Status: AC | PRN
  Administered 2022-01-17 (×2): 50 ug via INTRAVENOUS

## 2022-01-17 MED ORDER — LIDOCAINE-EPINEPHRINE 1 %-1:100000 IJ SOLN
INTRAMUSCULAR | Status: AC
  Administered 2022-01-17: 10 mL
  Filled 2022-01-17: qty 1

## 2022-01-17 MED ORDER — FENTANYL CITRATE (PF) 100 MCG/2ML IJ SOLN
INTRAMUSCULAR | Status: AC
  Filled 2022-01-17: qty 2

## 2022-01-17 MED ORDER — SODIUM CHLORIDE 0.9 % IV SOLN: INTRAVENOUS | Status: DC

## 2022-01-17 MED ORDER — SODIUM CHLORIDE 0.9 % IV SOLN: Freq: Once | INTRAVENOUS | Status: AC

## 2022-01-17 MED ORDER — TRILACICLIB DIHYDROCHLORIDE INJECTION 300 MG
240.0000 mg/m2 | Freq: Once | INTRAVENOUS | Status: AC
  Administered 2022-01-17: 450 mg via INTRAVENOUS
  Filled 2022-01-17: qty 30

## 2022-01-17 MED ORDER — SODIUM CHLORIDE 0.9 % IV SOLN
10.0000 mg | Freq: Once | INTRAVENOUS | Status: AC
  Administered 2022-01-17: 10 mg via INTRAVENOUS
  Filled 2022-01-17: qty 10

## 2022-01-17 MED ORDER — MIDAZOLAM HCL 2 MG/2ML IJ SOLN
INTRAMUSCULAR | Status: AC | PRN
  Administered 2022-01-17 (×2): 1 mg via INTRAVENOUS

## 2022-01-17 MED ORDER — IOHEXOL 300 MG/ML  SOLN: 50.0000 mL | Freq: Once | INTRAMUSCULAR | Status: DC | PRN

## 2022-01-17 MED ORDER — SODIUM CHLORIDE 0.9% FLUSH
10.0000 mL | INTRAVENOUS | Status: DC | PRN
  Administered 2022-01-17: 10 mL

## 2022-01-17 MED FILL — Dexamethasone Sodium Phosphate Inj 100 MG/10ML: INTRAMUSCULAR | Qty: 1 | Status: AC

## 2022-01-17 NOTE — Patient Instructions (Signed)
[facility]  Discharge Instructions: Thank you for choosing [facility] to provide your oncology and hematology care.   If you have a lab appointment with the [facility], please go directly to the [facility] and check in at the registration area.   Wear comfortable clothing and clothing appropriate for easy access to any Portacath or PICC line.   We strive to give you quality time with your provider. You may need to reschedule your appointment if you arrive late (15 or more minutes).  Arriving late affects you and other patients whose appointments are after yours.  Also, if you miss three or more appointments without notifying the office, you may be dismissed from the clinic at the provider's discretion.      For prescription refill requests, have your pharmacy contact our office and allow 72 hours for refills to be completed.    Today you received the following chemotherapy and/or immunotherapy agents: Cosela & Etoposide       To help prevent nausea and vomiting after your treatment, we encourage you to take your nausea medication as directed.  BELOW ARE SYMPTOMS THAT SHOULD BE REPORTED IMMEDIATELY: *FEVER GREATER THAN 100.4 F (38 C) OR HIGHER *CHILLS OR SWEATING *NAUSEA AND VOMITING THAT IS NOT CONTROLLED WITH YOUR NAUSEA MEDICATION *UNUSUAL SHORTNESS OF BREATH *UNUSUAL BRUISING OR BLEEDING *URINARY PROBLEMS (pain or burning when urinating, or frequent urination) *BOWEL PROBLEMS (unusual diarrhea, constipation, pain near the anus) TENDERNESS IN MOUTH AND THROAT WITH OR WITHOUT PRESENCE OF ULCERS (sore throat, sores in mouth, or a toothache) UNUSUAL RASH, SWELLING OR PAIN  UNUSUAL VAGINAL DISCHARGE OR ITCHING   Items with * indicate a potential emergency and should be followed up as soon as possible or go to the Emergency Department if any problems should occur.  Please show the CHEMOTHERAPY ALERT CARD or IMMUNOTHERAPY ALERT CARD at  check-in to the Emergency Department and triage nurse.  Should you have questions after your visit or need to cancel or reschedule your appointment, please contact [facility]  Dept: [phone]  and follow the prompts.  Office hours are 8:00 a.m. to 4:30 p.m. Monday - Friday. Please note that voicemails left after 4:00 p.m. may not be returned until the following business day.  We are closed weekends and major holidays. You have access to a nurse at all times for urgent questions. Please call the main number to the clinic Dept: [phone] and follow the prompts.   For any non-urgent questions, you may also contact your provider using MyChart. We now offer e-Visits for anyone 75 and older to request care online for non-urgent symptoms. For details visit [email or web address].   Also download the MyChart app! Go to the app store, search "MyChart", open the app, select [facility], and log in with your MyChart username and password.

## 2022-01-17 NOTE — Discharge Instructions (Signed)
Discharge Instructions:   Please call Interventional Radiology clinic [phone] with any questions or concerns.  You may remove your dressing and shower tomorrow.  Do not use EMLA / Lidocaine cream for 2 weeks post Port Insertion this will remove the surgical glue.  Moderate Conscious Sedation, Adult, Care After This sheet gives you information about how to care for yourself after your procedure. Your health care provider may also give you more specific instructions. If you have problems or questions, contact your health care provider. What can I expect after the procedure? After the procedure, it is common to have: Sleepiness for several hours. Impaired judgment for several hours. Difficulty with balance. Vomiting if you eat too soon. Follow these instructions at home: For the time period you were told by your health care provider: Rest. Do not participate in activities where you could fall or become injured. Do not drive or use machinery. Do not drink alcohol. Do not take sleeping pills or medicines that cause drowsiness. Do not make important decisions or sign legal documents. Do not take care of children on your own. Eating and drinking  Follow the diet recommended by your health care provider. Drink enough fluid to keep your urine pale yellow. If you vomit: Drink water, juice, or soup when you can drink without vomiting. Make sure you have little or no nausea before eating solid foods. General instructions Take over-the-counter and prescription medicines only as told by your health care provider. Have a responsible adult stay with you for the time you are told. It is important to have someone help care for you until you are awake and alert. Do not smoke. Keep all follow-up visits as told by your health care provider. This is important. Contact a health care provider if: You are still sleepy or having trouble with balance after 24 hours. You feel light-headed. You keep  feeling nauseous or you keep vomiting. You develop a rash. You have a fever. You have redness or swelling around the IV site. Get help right away if: You have trouble breathing. You have new-onset confusion at home. Summary After the procedure, it is common to feel sleepy, have impaired judgment, or feel nauseous if you eat too soon. Rest after you get home. Know the things you should not do after the procedure. Follow the diet recommended by your health care provider and drink enough fluid to keep your urine pale yellow. Get help right away if you have trouble breathing or new-onset confusion at home. This information is not intended to replace advice given to you by your health care provider. Make sure you discuss any questions you have with your health care provider. Document Revised: 04/30/2019 Document Reviewed: 11/26/2018 Elsevier Patient Education  [place] Insertion, Care After The following information offers guidance on how to care for yourself after your procedure. Your health care provider may also give you more specific instructions. If you have problems or questions, contact your health care provider. What can I expect after the procedure? After the procedure, it is common to have: Discomfort at the port insertion site. Bruising on the skin over the port. This should improve over 3-4 days. Follow these instructions at home: [facility] care After your port is placed, you will get a manufacturer's information card. The card has information about your port. Keep this card with you at all times. Take care of the port as told by your health care provider. Ask your health care provider if you or  a family member can get training for taking care of the port at home. A home health care nurse will be be available to help care for the port. Make sure to remember what type of port you have. Incision care     Follow instructions from your health care provider  about how to take care of your port insertion site. Make sure you: Wash your hands with soap and water for at least 20 seconds before and after you change your bandage (dressing). If soap and water are not available, use hand sanitizer. Change your dressing as told by your health care provider. Leave stitches (sutures), skin glue, or adhesive strips in place. These skin closures may need to stay in place for 2 weeks or longer. If adhesive strip edges start to loosen and curl up, you may trim the loose edges. Do not remove adhesive strips completely unless your health care provider tells you to do that. Check your port insertion site every day for signs of infection. Check for: Redness, swelling, or pain. Fluid or blood. Warmth. Pus or a bad smell. Activity Return to your normal activities as told by your health care provider. Ask your health care provider what activities are safe for you. You may have to avoid lifting. Ask your health care provider how much you can safely lift. General instructions Take over-the-counter and prescription medicines only as told by your health care provider. Do not take baths, swim, or use a hot tub until your health care provider approves. Ask your health care provider if you may take showers. You may only be allowed to take sponge baths. If you were given a sedative during the procedure, it can affect you for several hours. Do not drive or operate machinery until your health care provider says that it is safe. Wear a medical alert bracelet in case of an emergency. This will tell any health care providers that you have a port. Keep all follow-up visits. This is important. Contact a health care provider if: You cannot flush your port with saline as directed, or you cannot draw blood from the port. You have a fever or chills. You have redness, swelling, or pain around your port insertion site. You have fluid or blood coming from your port insertion site. Your port  insertion site feels warm to the touch. You have pus or a bad smell coming from the port insertion site. Get help right away if: You have chest pain or shortness of breath. You have bleeding from your port that you cannot control. These symptoms may be an emergency. Get help right away. Call 911. Do not wait to see if the symptoms will go away. Do not drive yourself to the hospital. Summary Take care of the port as told by your health care provider. Keep the manufacturer's information card with you at all times. Change your dressing as told by your health care provider. Contact a health care provider if you have a fever or chills or if you have redness, swelling, or pain around your port insertion site. Keep all follow-up visits. This information is not intended to replace advice given to you by your health care provider. Make sure you discuss any questions you have with your health care provider. Document Revised: 07/04/2020 Document Reviewed: 07/04/2020 Elsevier Patient Education  [place].

## 2022-01-17 NOTE — Procedures (Signed)
Interventional Radiology Procedure Note ° °Procedure: Single Lumen Power Port Placement   ° °Access:  Right internal jugular vein ° °Findings: Catheter tip positioned at cavoatrial junction. Port is ready for immediate use.  ° °Complications: None ° °EBL: < 10 mL ° °Recommendations:  °- Ok to shower in 24 hours °- Do not submerge for 7 days °- Routine line care  ° ° °Couper Juncaj, MD ° ° ° °

## 2022-01-18 ENCOUNTER — Inpatient Hospital Stay: Payer: Medicare Other

## 2022-01-18 ENCOUNTER — Encounter: Payer: Self-pay | Admitting: Internal Medicine

## 2022-01-18 ENCOUNTER — Other Ambulatory Visit: Payer: Self-pay

## 2022-01-18 VITALS — BP 138/78 | HR 88 | Temp 98.2°F | Resp 18

## 2022-01-18 DIAGNOSIS — Z5111 Encounter for antineoplastic chemotherapy: Secondary | ICD-10-CM | POA: Diagnosis not present

## 2022-01-18 DIAGNOSIS — C3491 Malignant neoplasm of unspecified part of right bronchus or lung: Secondary | ICD-10-CM

## 2022-01-18 MED ORDER — SODIUM CHLORIDE 0.9 % IV SOLN
10.0000 mg | Freq: Once | INTRAVENOUS | Status: AC
  Administered 2022-01-18: 10 mg via INTRAVENOUS
  Filled 2022-01-18: qty 10

## 2022-01-18 MED ORDER — SODIUM CHLORIDE 0.9 % IV SOLN
100.0000 mg/m2 | Freq: Once | INTRAVENOUS | Status: AC
  Administered 2022-01-18: 190 mg via INTRAVENOUS
  Filled 2022-01-18: qty 9.5

## 2022-01-18 MED ORDER — SODIUM CHLORIDE 0.9 % IV SOLN: Freq: Once | INTRAVENOUS | Status: AC

## 2022-01-18 MED ORDER — HEPARIN SOD (PORK) LOCK FLUSH 100 UNIT/ML IV SOLN
500.0000 [IU] | Freq: Once | INTRAVENOUS | Status: AC | PRN
  Administered 2022-01-18: 500 [IU]

## 2022-01-18 MED ORDER — SODIUM CHLORIDE 0.9% FLUSH
10.0000 mL | INTRAVENOUS | Status: DC | PRN
  Administered 2022-01-18: 10 mL

## 2022-01-18 MED ORDER — TRILACICLIB DIHYDROCHLORIDE INJECTION 300 MG
240.0000 mg/m2 | Freq: Once | INTRAVENOUS | Status: AC
  Administered 2022-01-18: 450 mg via INTRAVENOUS
  Filled 2022-01-18: qty 30

## 2022-01-18 NOTE — Patient Instructions (Signed)
[facility]  Discharge Instructions: Thank you for choosing [facility] to provide your oncology and hematology care.   If you have a lab appointment with the [facility], please go directly to the [facility] and check in at the registration area.   Wear comfortable clothing and clothing appropriate for easy access to any Portacath or PICC line.   We strive to give you quality time with your provider. You may need to reschedule your appointment if you arrive late (15 or more minutes).  Arriving late affects you and other patients whose appointments are after yours.  Also, if you miss three or more appointments without notifying the office, you may be dismissed from the clinic at the provider's discretion.      For prescription refill requests, have your pharmacy contact our office and allow 72 hours for refills to be completed.    Today you received the following chemotherapy and/or immunotherapy agents: Cosela & Etoposide       To help prevent nausea and vomiting after your treatment, we encourage you to take your nausea medication as directed.  BELOW ARE SYMPTOMS THAT SHOULD BE REPORTED IMMEDIATELY: *FEVER GREATER THAN 100.4 F (38 C) OR HIGHER *CHILLS OR SWEATING *NAUSEA AND VOMITING THAT IS NOT CONTROLLED WITH YOUR NAUSEA MEDICATION *UNUSUAL SHORTNESS OF BREATH *UNUSUAL BRUISING OR BLEEDING *URINARY PROBLEMS (pain or burning when urinating, or frequent urination) *BOWEL PROBLEMS (unusual diarrhea, constipation, pain near the anus) TENDERNESS IN MOUTH AND THROAT WITH OR WITHOUT PRESENCE OF ULCERS (sore throat, sores in mouth, or a toothache) UNUSUAL RASH, SWELLING OR PAIN  UNUSUAL VAGINAL DISCHARGE OR ITCHING   Items with * indicate a potential emergency and should be followed up as soon as possible or go to the Emergency Department if any problems should occur.  Please show the CHEMOTHERAPY ALERT CARD or IMMUNOTHERAPY ALERT CARD at  check-in to the Emergency Department and triage nurse.  Should you have questions after your visit or need to cancel or reschedule your appointment, please contact [address]  Dept: [phone]  and follow the prompts.  Office hours are 8:00 a.m. to 4:30 p.m. Monday - Friday. Please note that voicemails left after 4:00 p.m. may not be returned until the following business day.  We are closed weekends and major holidays. You have access to a nurse at all times for urgent questions. Please call the main number to the clinic Dept: [phone] and follow the prompts.   For any non-urgent questions, you may also contact your provider using MyChart. We now offer e-Visits for anyone 90 and older to request care online for non-urgent symptoms. For details visit [email or web address].   Also download the MyChart app! Go to the app store, search "MyChart", open the app, select [facility], and log in with your MyChart username and password.

## 2022-01-22 ENCOUNTER — Other Ambulatory Visit: Payer: Self-pay

## 2022-01-22 NOTE — Radiation Completion Notes (Signed)
Patient Name: [name], PUCKETT MRN: [id number] Date of Birth: 1950/06/08 Referring Physician: Kara Mead, M.D. Date of Service: 2022-01-22 Radiation Oncologist: Teryl Lucy, M.D. 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 Intent: Palliative     ==========DELIVERED PLANS==========  First Treatment Date: 2021-12-20 - Last Treatment Date: 2022-01-02   Plan Name: Chest_L Site: Scapula, Left Technique: 3D Mode: Photon Dose Per Fraction: 3 Gy Prescribed Dose (Delivered / Prescribed): 30 Gy / 30 Gy Prescribed Fxs (Delivered / Prescribed): 10 / 10   Plan Name: Pelvis_R Site: Hip, Right Technique: 3D Mode: Photon Dose Per Fraction: 3 Gy Prescribed Dose (Delivered / Prescribed): 30 Gy / 30 Gy Prescribed Fxs (Delivered / Prescribed): 10 / 10     ==========ON TREATMENT VISIT DATES========== 2021-12-25, 2022-01-01     ==========UPCOMING VISITS==========       ==========APPENDIX - ON TREATMENT VISIT NOTES==========   PatEd 2021-12-21 Ongoing education performed.   ImpPlan 2021-12-21 The patient is tolerating radiation. Continue treatment as planned.   PhysExam 2021-12-21 Alert, no acute distress.   PatEd 2021-12-25 Ongoing education performed.   ImpPlan 2021-12-25 The patient is tolerating radiation. Continue treatment as planned.   PhysExam 2021-12-25 Alert, no acute distress.   ProgNote 2021-12-25 Specific Site [ Left Scapula, and right pelvis ] Changes from last week/visit? [ No ] Pain? [ Reports back pain when laying on the treatment table.  ] Fatigue? [ Yes ] Skin irritation? [ No ] Current medication regimen: [ Yes ] Need refills: [ No ] Additional  Weekly Progress Notes [ Appetite is good.  ]     PatEd 2022-01-01 Ongoing education performed.   ImpPlan 2022-01-01 The patient is tolerating radiation. Continue treatment as planned.   PhysExam 2022-01-01 Alert, no acute distress.   ProgNote 2022-01-01 Specific Site [ left scapula and right hip ] Changes from last week/visit? [ No ] Pain? [ Yes 8/10 both areas being treated ] Fatigue? [ Yes ] Skin irritation? [ Yes, scabs right  buttocks not sure of the cause.  Advised to put vaseline on area not to pull of scabs. ] Current medication regimen: [ No ] Need refills: [  ] Additional  Weekly Progress Notes [ EOT 01/02/2022 ]

## 2022-01-28 ENCOUNTER — Other Ambulatory Visit (HOSPITAL_COMMUNITY): Payer: Self-pay

## 2022-01-28 NOTE — Progress Notes (Signed)
Called and spoke with patient, advised of results/recommendations per Tammy Parrett NP.  She verbalized understanding.  Nothing further needed.

## 2022-01-28 NOTE — Progress Notes (Signed)
[name] is here today for follow up post radiation to the lung.  Lung Side: left chest,left scapula,right hip 12-20-21 to 01-02-22  Does the patient complain of any of the following: Pain:Patient states that she has pain in her rt groin area states that she has medication. Shortness of breath w/wo exertion: Denies Cough: dry cough Hemoptysis: Denies,but states her nose bleeds sometime. Pain with swallowing: Denies Swallowing/choking concerns: Denies Appetite: States that her appetite  is ok. Energy level: States that she is very weak. Post radiation skin Changes: Denies any issues with skin.States that her chest is sore where the port is.    Additional comments if applicable: Vitals:   02/04/22 1531  BP: 136/64  Pulse: (!) 106  Resp: 20  Temp: 97.6 F (36.4 C)  SpO2: 100%  Weight: 83.6 kg  Height: 5\' 3"  (1.6 m)

## 2022-01-31 ENCOUNTER — Other Ambulatory Visit (HOSPITAL_COMMUNITY): Payer: Self-pay

## 2022-01-31 MED ORDER — OXYCODONE HCL 10 MG PO TABS
10.0000 mg | ORAL_TABLET | ORAL | 0 refills | Status: DC | PRN
  Filled 2022-01-31: qty 180, 30d supply, fill #0

## 2022-02-01 ENCOUNTER — Ambulatory Visit (HOSPITAL_COMMUNITY)
Admission: RE | Admit: 2022-02-01 | Discharge: 2022-02-01 | Disposition: A | Discharge status: Home / Self Care | Payer: Medicare Other | Source: Ambulatory Visit | Attending: Internal Medicine | Admitting: Internal Medicine

## 2022-02-01 ENCOUNTER — Encounter: Payer: Self-pay | Admitting: Radiation Oncology

## 2022-02-01 DIAGNOSIS — C3490 Malignant neoplasm of unspecified part of unspecified bronchus or lung: Secondary | ICD-10-CM | POA: Insufficient documentation

## 2022-02-01 MED ORDER — IOHEXOL 300 MG/ML  SOLN
100.0000 mL | Freq: Once | INTRAMUSCULAR | Status: AC | PRN
  Administered 2022-02-01: 100 mL via INTRAVENOUS

## 2022-02-03 NOTE — Progress Notes (Incomplete)
  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 A diagnosis of Mediastinal lymphadenopathy was also pertinent to this visit.   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 We discussed potential radiation therapy to this area in the future and I requested that she reach out to our office if her pain increases.   Plan: The patient will follow-up with radiation oncology in one month.  ________________________________________________ -----------------------------------  Billie Lade, PhD, MD  This document serves as a record of services personally performed by Antony Blackbird, MD. It was created on his behalf by Neena Rhymes, a trained medical scribe. The creation of this record is based on the scribe's personal observations and the provider's statements to them. This document has been checked and approved by the  attending provider.

## 2022-02-04 ENCOUNTER — Encounter: Payer: Self-pay | Admitting: Radiation Oncology

## 2022-02-04 ENCOUNTER — Other Ambulatory Visit: Payer: Self-pay

## 2022-02-04 ENCOUNTER — Encounter: Payer: Self-pay | Admitting: Internal Medicine

## 2022-02-04 ENCOUNTER — Ambulatory Visit
Admission: RE | Admit: 2022-02-04 | Discharge: 2022-02-04 | Disposition: A | Discharge status: Home / Self Care | Payer: Medicare Other | Source: Ambulatory Visit | Attending: Radiation Oncology | Admitting: Radiation Oncology

## 2022-02-04 VITALS — BP 136/64 | HR 106 | Temp 97.6°F | Resp 20 | Ht 63.0 in | Wt 184.2 lb

## 2022-02-04 DIAGNOSIS — C7951 Secondary malignant neoplasm of bone: Secondary | ICD-10-CM | POA: Diagnosis not present

## 2022-02-04 DIAGNOSIS — Z79899 Other long term (current) drug therapy: Secondary | ICD-10-CM | POA: Diagnosis not present

## 2022-02-04 DIAGNOSIS — C3431 Malignant neoplasm of lower lobe, right bronchus or lung: Secondary | ICD-10-CM | POA: Diagnosis not present

## 2022-02-04 DIAGNOSIS — Z923 Personal history of irradiation: Secondary | ICD-10-CM | POA: Diagnosis not present

## 2022-02-04 DIAGNOSIS — C3491 Malignant neoplasm of unspecified part of right bronchus or lung: Secondary | ICD-10-CM

## 2022-02-04 DIAGNOSIS — Z7982 Long term (current) use of aspirin: Secondary | ICD-10-CM | POA: Insufficient documentation

## 2022-02-04 DIAGNOSIS — R918 Other nonspecific abnormal finding of lung field: Secondary | ICD-10-CM

## 2022-02-04 HISTORY — DX: Personal history of irradiation: Z92.3

## 2022-02-04 NOTE — Progress Notes (Signed)
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 patient did complete an MRI of the brain.  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  In questioning today the patient denies any pain along the skull or scalp.  She denies any pain along the upper neck region.  I reviewed her PET scan which was performed after we completed her palliative radiation therapy directed at the right pelvis.  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    Plan: Will plan to have the patient's brain/upper cervical MRI reviewed at the weekly brain tumor conference.  If palliative radiation therapy recommended for stability issues of the C1 region then will contact patient and have her return for additional radiation to this area.  She will continue close follow-up with medical oncology with Dr. Arbutus Ped.    ____________________________________  Billie Lade, PhD, MD  This document serves as a record of services personally performed by Antony Blackbird, MD. It was created on his behalf by Neena Rhymes, a trained medical scribe. The creation of this  record is based on the scribe's personal observations and the provider's statements to them. This document has been checked and approved by the attending provider.

## 2022-02-04 NOTE — Progress Notes (Unsigned)
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 hot on the bone scan. There are additional areas as well in the lumbar spine including upper sacrum, L4, L3, L2. Follow up bone scan as clinically indicated. CT ABDOMEN PELVIS FINDINGS Hepatobiliary: No space-occupying liver lesion. Slight biliary duct ectasia but patient is status post cholecystectomy. Patent portal vein. Pancreas: Atrophy of the pancreas is stable. No obvious enhancing mass. Spleen: Spleen is nonenlarged. Adrenals/Urinary Tract: Adrenal  glands are grossly preserved. Only minimal thickening of the right adrenal gland, nonspecific and unchanged. No enhancing renal mass or collecting system dilatation. The uterus has a normal course and caliber down to the bladder. Relatively collapsed urinary bladder. Stomach/Bowel: Mild debris in the stomach which is nondilated. The small bowel is nondilated. Large bowel is of normal course and caliber with scattered stool. Normal retrocecal appendix. Few scattered colonic diverticula greatest of the sigmoid colon. Vascular/Lymphatic: Diffuse vascular calcifications along the aorta and branch vessels with areas of potential stenosis including splenic artery, SMA  and iliacs. Preserved IVC. No specific abnormal lymph node enlargement identified in the abdomen or pelvis. Reproductive: Status post hysterectomy. No adnexal masses. Other: There is streak artifact related to the patient's left hip arthroplasty obscuring portions of the pelvis. There is a neurostimulator along the left hemisacrum with battery pack in the subcutaneous tissues posterior to the left hemipelvis. No ascites. Musculoskeletal: Please see above. Multiple sclerotic bone lesions identified along the spine. These were seen by prior PET-CT. If needed for follow-up a bone scan can be performed. There is increasing Schmorl's node deformity at the T12 level particularly along the lower endplate. IMPRESSION: Marked improvement in the abnormal mediastinal lymph nodes and right hilar mass as well as ill-defined masslike opacities in the right lower lobe. Mild residual in these locations including some subtle areas of right lower lung nodularity and opacity. Recommend continued follow-up. Again noted are multiple sclerotic bone metastases which were seen is hypermetabolic lesions on the PET-CT scan. Follow up bone scan as clinically appropriate to more directly compare distribution. Slightly increasing compression and Schmorl's node deformity of the inferior endplate of E33. Electronically Signed   By: Jill Side M.D.   On: 02/01/2022 17:59   IR IMAGING GUIDED PORT INSERTION  Result Date: 01/17/2022 INDICATION: 72 year old female with history of advanced stage lung cancer requiring central venous access for chemotherapy administration. EXAM: IMPLANTED PORT A CATH PLACEMENT WITH ULTRASOUND AND FLUOROSCOPIC GUIDANCE COMPARISON:  None Available. ANESTHESIA/SEDATION: Moderate (conscious) sedation was employed during this procedure. A total of Versed 2 mg and Fentanyl 100 mcg was administered intravenously. Moderate Sedation Time: 18 minutes. The patient's level of consciousness and vital signs were monitored  continuously by radiology nursing throughout the procedure under my direct supervision. CONTRAST:  None FLUOROSCOPY TIME:  One mGy COMPLICATIONS: None immediate. PROCEDURE: The procedure, risks, benefits, and alternatives were explained to the patient. Questions regarding the procedure were encouraged and answered. The patient understands and consents to the procedure. The right neck and chest were prepped with chlorhexidine in a sterile fashion, and a sterile drape was applied covering the operative field. Maximum barrier sterile technique with sterile gowns and gloves were used for the procedure. A timeout was performed prior to the initiation of the procedure. Ultrasound was used to examine the jugular vein which was compressible and free of internal echoes. A skin marker was used to demarcate the planned venotomy and port pocket incision sites. Local anesthesia was provided to these sites and the subcutaneous tunnel track with 1% lidocaine with 1:100,000 epinephrine. A small incision was created at the jugular access site and blunt dissection was performed of the subcutaneous tissues. Under ultrasound guidance, the jugular vein was accessed with a 21 ga micropuncture needle and an 0.018" wire was inserted to the superior vena cava. Real-time ultrasound guidance was utilized for vascular access including the acquisition of a permanent ultrasound image documenting patency of the accessed vessel. A 5 Fr micopuncture set was  then used, through which a 0.035" Rosen wire was passed under fluoroscopic guidance into the inferior vena cava. An 8 Fr dilator was then placed over the wire. A subcutaneous port pocket was then created along the upper chest wall utilizing a combination of sharp and blunt dissection. The pocket was irrigated with sterile saline, packed with gauze, and observed for hemorrhage. A single lumen "ISP" sized power injectable port was chosen for placement. The 8 Fr catheter was tunneled from the port  pocket site to the venotomy incision. The port was placed in the pocket. The external catheter was trimmed to appropriate length. The dilator was exchanged for an 8 Fr peel-away sheath under fluoroscopic guidance. The catheter was then placed through the sheath and the sheath was removed. Final catheter positioning was confirmed and documented with a fluoroscopic spot radiograph. The port was accessed with a Huber needle, aspirated, and flushed with heparinized saline. The deep dermal layer of the port pocket incision was closed with interrupted 3-0 Vicryl suture. Dermabond was then placed over the port pocket and neck incisions. The patient tolerated the procedure well without immediate post procedural complication. FINDINGS: After catheter placement, the tip lies within the superior cavoatrial junction. The catheter aspirates and flushes normally and is ready for immediate use. IMPRESSION: Successful placement of a power injectable Port-A-Cath via the right internal jugular vein. The catheter is ready for immediate use. Ruthann Cancer, MD [facility] Radiology Electronically Signed   By: Ruthann Cancer M.D.   On: 01/17/2022 14:37   MR BRAIN W WO CONTRAST  Result Date: 01/16/2022 CLINICAL DATA:  Small-cell lung cancer. EXAM: MRI HEAD WITHOUT AND WITH CONTRAST TECHNIQUE: Multiplanar, multiecho pulse sequences of the brain and surrounding structures were obtained without and with intravenous contrast. CONTRAST:  7.5 mL Vueway COMPARISON:  Head CT 08/29/2021.  PET-12/12/2021. FINDINGS: Brain: There is no evidence of an acute infarct, mass, midline shift, or extra-axial fluid collection. Patchy and confluent T2 hyperintensities in the cerebral white matter, deep gray nuclei, and pons are nonspecific but compatible with moderate to severe chronic small vessel ischemic disease. 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 No abnormal enhancement is identified. There  is mild cerebral atrophy. Vascular: Major intracranial vascular flow voids are preserved. Skull and upper cervical spine: Enhancing bilateral frontal and left parietal skull lesions. Enhancing lesion involving the left lateral mass of C1. Sinuses/Orbits: Bilateral cataract extraction. Clear paranasal sinuses. Trace bilateral mastoid effusions. Other: None. IMPRESSION: 1. No evidence of intracranial metastases. 2. Skull and cervical spine osseous metastases. 3. Moderate to severe chronic small vessel ischemic disease. Electronically Signed   By: Logan Bores M.D.   On: 01/16/2022 18:11     ASSESSMENT/PLAN:  This is a very pleasant 72 year old Caucasian female diagnosed with extensive stage (T4, N2, M1 C) small cell lung cancer.  The patient presented with a large right lower lobe consolidative mass in addition to right hilar mediastinal lymphadenopathy and extensive bony metastases.  The patient was diagnosed in November 2023.  Patient completed palliative radiotherapy to the left scapular region and right hip under the care of Dr. Sondra Come which was completed on 01/02/2022.  The patient is currently undergoing palliative systemic chemotherapy with carboplatin for an AUC of 5 on day 1, etoposide 100 mg/m on days 1, 2, and 3 with Cosela 240 mg as well as Imfinzi 1500 mg on day 1.  The patient receives treatment IV every 3 weeks.  She is status post 2 cycles.  The patient recently had a restaging CT scan performed.  Dr. Arbutus Ped personally independently reviewed the scan discussed results with the patient today.  The scan showed positive response to treatment.  Dr. Arbutus Ped recommends the patient continue on the same treatment. However, given her anemia requiring frequent supportive care, we will reduce the dose of her carboplatin to an AUC of 4 and etoposide to 80 mg/m2.   Reviewed her labs with Dr. Arbutus Ped. Her ANC is 1.4. Her Hbg is 6.4. Despite her Hbg being 6.4, she is overall well appearing today,  although she does report progressive fatigue. We will arrange for 1 unit of blood to be given tomorrow and Friday. She was instructed to make sure she does not take off her blue bracelet for her blood transfusion. We will monitor her labs closely on a weekly basis.   We will see her back for follow-up visit in 3 weeks for evaluation repeat blood work before starting cycle #4, which will be her last cycle of treatment including chemotherapy.  Starting from cycle 5, she will start single agent maintenance immunotherapy with Imfinzi IV every 4 weeks.  She had a follow-up with Dr. Roselind Messier on 02/04/22. Dr. Roselind Messier mentioned in prior message that can always arrive palliative to other painful bone lesions if needed. She denies pain today.   For the nose bleeding, recommend that she use saline spray and humidifier. Also, she was encouraged to avoid any forceful blowing or nasal trauma. If she has excessive bleeding, we would recommend Er visit.   The patient was advised to call immediately if she has any concerning symptoms in the interval. The patient voices understanding of current disease status and treatment options and is in agreement with the current care plan. All questions were answered. The patient knows to call the clinic with any problems, questions or concerns. We can certainly see the patient much sooner if necessary     Orders Placed This Encounter  Procedures   Informed Consent Details: Physician/Practitioner Attestation; Transcribe to consent form and obtain patient signature    Standing Status:   Future    Standing Expiration Date:   02/07/2023    Order Specific Question:   Physician/Practitioner attestation of informed consent for blood and or blood product transfusion    Answer:   I, the physician/practitioner, attest that I have discussed with the patient the benefits, risks, side effects, alternatives, likelihood of achieving goals and potential problems during recovery for the  procedure that I have provided informed consent.    Order Specific Question:   Product(s)    Answer:   All Product(s)   Care order/instruction    Transfuse Parameters    Standing Status:   Future    Standing Expiration Date:   02/06/2023   Care order/instruction    Transfuse Parameters    Standing Status:   Future    Standing Expiration Date:   02/06/2023   Type and screen         Standing Status:   Future    Number of Occurrences:   1    Standing Expiration Date:   02/07/2023   Prepare RBC (crossmatch)    Standing Status:   Standing    Number of Occurrences:   1    Order Specific Question:   # of Units    Answer:   1 unit    Order Specific Question:   Transfusion Indications    Answer:   Hemoglobin < 7 gm/dL and symptomatic  Order Specific Question:   Number of Units to Keep Ahead    Answer:   NO units ahead    Order Specific Question:   If emergent release call blood bank    Answer:   [facility] [phone]   Type and screen         Standing Status:   Future    Standing Expiration Date:   02/07/2023   Prepare RBC (crossmatch)    Standing Status:   Standing    Number of Occurrences:   1    Order Specific Question:   # of Units    Answer:   1 unit    Order Specific Question:   Transfusion Indications    Answer:   Hemoglobin < 7 gm/dL and symptomatic    Order Specific Question:   Number of Units to Keep Ahead    Answer:   NO units ahead    Order Specific Question:   If emergent release call blood bank    Answer:   [facility] [phone]   Sample to Blood Bank    Standing Status:   Standing    Number of Occurrences:   10    Standing Expiration Date:   02/07/2023     Annamarie Yamaguchi Kenwood Rosiak, PA-C 02/06/22  ADDENDUM: Hematology/Oncology Attending:  I had a face-to-face encounter with the patient today.  I reviewed her records, lab, scan and recommended her care plan.  This is a very pleasant 72 years old white female with extensive stage small cell lung cancer  diagnosed in November 2023 status post palliative radiotherapy to the left scapular region as well as the right hip area under the care of Dr. Roselind Messier. The patient is undergoing palliative systemic chemotherapy with carboplatin, etoposide, Cosela as well as Imfinzi status post 2 cycles.  She has been tolerating this treatment fairly well with no concerning adverse effects except for the chemotherapy-induced pancytopenia with significant anemia today. She had repeat CT scan of the chest, abdomen and pelvis performed recently.  I personally and independently reviewed the scan images and discussed the result with the patient and her husband. Her scan showed significant improvement in her disease. I recommended for the patient to continue her current treatment with the same regiment but I will reduce the dose of carboplatin to AUC of 4 and etoposide 80 Mg/M2 starting from cycle #3. For the chemotherapy-induced anemia, will arrange for the patient to receive 2 units of PRBCs transfusion. The patient will come back for follow-up visit in 3 weeks for evaluation before the next cycle of her treatment. She was advised to call immediately if she has any other concerning symptoms in the interval. The total time spent in the appointment was 30 minutes. Disclaimer: This note was dictated with voice recognition software. Similar sounding words can inadvertently be transcribed and may be missed upon review. Lajuana Matte, MD

## 2022-02-05 ENCOUNTER — Encounter: Payer: Self-pay | Admitting: Internal Medicine

## 2022-02-05 MED FILL — Dexamethasone Sodium Phosphate Inj 100 MG/10ML: INTRAMUSCULAR | Qty: 1 | Status: AC

## 2022-02-05 MED FILL — Fosaprepitant Dimeglumine For IV Infusion 150 MG (Base Eq): INTRAVENOUS | Qty: 5 | Status: AC

## 2022-02-06 ENCOUNTER — Other Ambulatory Visit: Payer: Self-pay | Admitting: Medical Oncology

## 2022-02-06 ENCOUNTER — Inpatient Hospital Stay: Payer: Medicare Other

## 2022-02-06 ENCOUNTER — Other Ambulatory Visit: Payer: Self-pay | Admitting: Radiation Therapy

## 2022-02-06 ENCOUNTER — Other Ambulatory Visit: Payer: Self-pay | Admitting: Physician Assistant

## 2022-02-06 ENCOUNTER — Telehealth: Payer: Self-pay | Admitting: Medical Oncology

## 2022-02-06 ENCOUNTER — Other Ambulatory Visit: Payer: Self-pay

## 2022-02-06 ENCOUNTER — Inpatient Hospital Stay: Payer: Medicare Other | Admitting: Physician Assistant

## 2022-02-06 VITALS — BP 115/56 | HR 78 | Temp 97.9°F | Resp 18 | Ht 63.0 in | Wt 186.0 lb

## 2022-02-06 DIAGNOSIS — T451X5A Adverse effect of antineoplastic and immunosuppressive drugs, initial encounter: Secondary | ICD-10-CM

## 2022-02-06 DIAGNOSIS — D649 Anemia, unspecified: Secondary | ICD-10-CM | POA: Diagnosis not present

## 2022-02-06 DIAGNOSIS — C3491 Malignant neoplasm of unspecified part of right bronchus or lung: Secondary | ICD-10-CM | POA: Diagnosis not present

## 2022-02-06 DIAGNOSIS — C3490 Malignant neoplasm of unspecified part of unspecified bronchus or lung: Secondary | ICD-10-CM | POA: Insufficient documentation

## 2022-02-06 DIAGNOSIS — D6481 Anemia due to antineoplastic chemotherapy: Secondary | ICD-10-CM

## 2022-02-06 DIAGNOSIS — Z95828 Presence of other vascular implants and grafts: Secondary | ICD-10-CM

## 2022-02-06 DIAGNOSIS — Z5111 Encounter for antineoplastic chemotherapy: Secondary | ICD-10-CM | POA: Diagnosis not present

## 2022-02-06 DIAGNOSIS — Z5112 Encounter for antineoplastic immunotherapy: Secondary | ICD-10-CM

## 2022-02-06 LAB — CBC WITH DIFFERENTIAL (CANCER CENTER ONLY)
Abs Immature Granulocytes: 0.03 10*3/uL (ref 0.00–0.07)
Basophils Absolute: 0 10*3/uL (ref 0.0–0.1)
Basophils Relative: 0 %
Eosinophils Absolute: 0.1 10*3/uL (ref 0.0–0.5)
Eosinophils Relative: 5 %
HCT: 19.7 % — ABNORMAL LOW (ref 36.0–46.0)
Hemoglobin: 6.4 g/dL — CL (ref 12.0–15.0)
Immature Granulocytes: 1 %
Lymphocytes Relative: 19 %
Lymphs Abs: 0.5 10*3/uL — ABNORMAL LOW (ref 0.7–4.0)
MCH: 29.4 pg (ref 26.0–34.0)
MCHC: 32.5 g/dL (ref 30.0–36.0)
MCV: 90.4 fL (ref 80.0–100.0)
Monocytes Absolute: 0.4 10*3/uL (ref 0.1–1.0)
Monocytes Relative: 17 %
Neutro Abs: 1.4 10*3/uL — ABNORMAL LOW (ref 1.7–7.7)
Neutrophils Relative %: 58 %
Platelet Count: 188 10*3/uL (ref 150–400)
RBC: 2.18 MIL/uL — ABNORMAL LOW (ref 3.87–5.11)
RDW: 27.2 % — ABNORMAL HIGH (ref 11.5–15.5)
WBC Count: 2.4 10*3/uL — ABNORMAL LOW (ref 4.0–10.5)
nRBC: 2.5 % — ABNORMAL HIGH (ref 0.0–0.2)

## 2022-02-06 LAB — CMP (CANCER CENTER ONLY)
ALT: 7 U/L (ref 0–44)
AST: 10 U/L — ABNORMAL LOW (ref 15–41)
Albumin: 3.2 g/dL — ABNORMAL LOW (ref 3.5–5.0)
Alkaline Phosphatase: 153 U/L — ABNORMAL HIGH (ref 38–126)
Anion gap: 5 (ref 5–15)
BUN: 9 mg/dL (ref 8–23)
CO2: 31 mmol/L (ref 22–32)
Calcium: 8.2 mg/dL — ABNORMAL LOW (ref 8.9–10.3)
Chloride: 100 mmol/L (ref 98–111)
Creatinine: 0.68 mg/dL (ref 0.44–1.00)
GFR, Estimated: >60 mL/min (ref >60)
Glucose, Bld: 101 mg/dL — ABNORMAL HIGH (ref 70–99)
Potassium: 3.4 mmol/L — ABNORMAL LOW (ref 3.5–5.1)
Sodium: 136 mmol/L (ref 135–145)
Total Bilirubin: 0.3 mg/dL (ref 0.3–1.2)
Total Protein: 6 g/dL — ABNORMAL LOW (ref 6.5–8.1)

## 2022-02-06 LAB — SAMPLE TO BLOOD BANK

## 2022-02-06 LAB — PREPARE RBC (CROSSMATCH)

## 2022-02-06 LAB — TSH: TSH: 2.968 u[IU]/mL (ref 0.350–4.500)

## 2022-02-06 MED ORDER — SODIUM CHLORIDE 0.9 % IV SOLN
10.0000 mg | Freq: Once | INTRAVENOUS | Status: AC
  Administered 2022-02-06: 10 mg via INTRAVENOUS
  Filled 2022-02-06: qty 10

## 2022-02-06 MED ORDER — SODIUM CHLORIDE 0.9 % IV SOLN
364.4000 mg | Freq: Once | INTRAVENOUS | Status: AC
  Administered 2022-02-06: 350 mg via INTRAVENOUS
  Filled 2022-02-06: qty 35

## 2022-02-06 MED ORDER — SODIUM CHLORIDE 0.9 % IV SOLN
1500.0000 mg | Freq: Once | INTRAVENOUS | Status: AC
  Administered 2022-02-06: 1500 mg via INTRAVENOUS
  Filled 2022-02-06: qty 30

## 2022-02-06 MED ORDER — SODIUM CHLORIDE 0.9% FLUSH: 10.0000 mL | INTRAVENOUS | Status: DC | PRN

## 2022-02-06 MED ORDER — SODIUM CHLORIDE 0.9 % IV SOLN
150.0000 mg | Freq: Once | INTRAVENOUS | Status: AC
  Administered 2022-02-06: 150 mg via INTRAVENOUS
  Filled 2022-02-06: qty 150

## 2022-02-06 MED ORDER — PALONOSETRON HCL INJECTION 0.25 MG/5ML
0.2500 mg | Freq: Once | INTRAVENOUS | Status: AC
  Administered 2022-02-06: 0.25 mg via INTRAVENOUS
  Filled 2022-02-06: qty 5

## 2022-02-06 MED ORDER — TRILACICLIB DIHYDROCHLORIDE INJECTION 300 MG
240.0000 mg/m2 | Freq: Once | INTRAVENOUS | Status: AC
  Administered 2022-02-06: 450 mg via INTRAVENOUS
  Filled 2022-02-06: qty 30

## 2022-02-06 MED ORDER — SODIUM CHLORIDE 0.9 % IV SOLN
80.0000 mg/m2 | Freq: Once | INTRAVENOUS | Status: AC
  Administered 2022-02-06: 150 mg via INTRAVENOUS
  Filled 2022-02-06: qty 7.5

## 2022-02-06 MED ORDER — SODIUM CHLORIDE 0.9% FLUSH
10.0000 mL | Freq: Once | INTRAVENOUS | Status: AC
  Administered 2022-02-06: 10 mL

## 2022-02-06 MED ORDER — HEPARIN SOD (PORK) LOCK FLUSH 100 UNIT/ML IV SOLN: 500.0000 [IU] | Freq: Once | INTRAVENOUS | Status: DC | PRN

## 2022-02-06 MED ORDER — SODIUM CHLORIDE 0.9 % IV SOLN: Freq: Once | INTRAVENOUS | Status: AC

## 2022-02-06 MED FILL — Dexamethasone Sodium Phosphate Inj 100 MG/10ML: INTRAMUSCULAR | Qty: 1 | Status: AC

## 2022-02-06 NOTE — Telephone Encounter (Signed)
CRITICAL VALUE STICKER  CRITICAL VALUE: hgb 6.4  RECEIVER (on-site recipient of call):[name]  DATE & TIME NOTIFIED: 02/06/22 @ 1149  MESSENGER (representative from lab):  MD NOTIFIED: Cassie Heillingoetter, PA-C  TIME OF NOTIFICATION:1150  RESPONSE:  Cassie is seeing pt for office visit.

## 2022-02-06 NOTE — Progress Notes (Signed)
Per Cassie PA, ok to treat with Hgb of 6.6 and ANC of 1.4

## 2022-02-07 ENCOUNTER — Inpatient Hospital Stay: Payer: Medicare Other

## 2022-02-07 VITALS — BP 125/68 | HR 93 | Temp 98.6°F | Resp 18

## 2022-02-07 DIAGNOSIS — C3491 Malignant neoplasm of unspecified part of right bronchus or lung: Secondary | ICD-10-CM

## 2022-02-07 DIAGNOSIS — Z5111 Encounter for antineoplastic chemotherapy: Secondary | ICD-10-CM | POA: Diagnosis not present

## 2022-02-07 DIAGNOSIS — D6481 Anemia due to antineoplastic chemotherapy: Secondary | ICD-10-CM

## 2022-02-07 LAB — TYPE AND SCREEN
ABO/RH(D): O POS
Antibody Screen: NEGATIVE
Unit division: 0

## 2022-02-07 LAB — BPAM RBC
Blood Product Expiration Date: 202402252359
Unit Type and Rh: 5100

## 2022-02-07 LAB — PREPARE RBC (CROSSMATCH)

## 2022-02-07 MED ORDER — SODIUM CHLORIDE 0.9% IV SOLUTION
250.0000 mL | Freq: Once | INTRAVENOUS | Status: AC
  Administered 2022-02-07: 250 mL via INTRAVENOUS

## 2022-02-07 MED ORDER — SODIUM CHLORIDE 0.9% FLUSH
10.0000 mL | INTRAVENOUS | Status: DC | PRN
  Administered 2022-02-07: 10 mL

## 2022-02-07 MED ORDER — DIPHENHYDRAMINE HCL 25 MG PO CAPS
25.0000 mg | ORAL_CAPSULE | Freq: Once | ORAL | Status: AC
  Administered 2022-02-07: 25 mg via ORAL
  Filled 2022-02-07: qty 1

## 2022-02-07 MED ORDER — SODIUM CHLORIDE 0.9 % IV SOLN
80.0000 mg/m2 | Freq: Once | INTRAVENOUS | Status: AC
  Administered 2022-02-07: 150 mg via INTRAVENOUS
  Filled 2022-02-07: qty 7.5

## 2022-02-07 MED ORDER — ACETAMINOPHEN 325 MG PO TABS
650.0000 mg | ORAL_TABLET | Freq: Once | ORAL | Status: AC
  Administered 2022-02-07: 650 mg via ORAL
  Filled 2022-02-07: qty 2

## 2022-02-07 MED ORDER — HEPARIN SOD (PORK) LOCK FLUSH 100 UNIT/ML IV SOLN
500.0000 [IU] | Freq: Once | INTRAVENOUS | Status: AC | PRN
  Administered 2022-02-07: 500 [IU]

## 2022-02-07 MED ORDER — SODIUM CHLORIDE 0.9 % IV SOLN
10.0000 mg | Freq: Once | INTRAVENOUS | Status: AC
  Administered 2022-02-07: 10 mg via INTRAVENOUS
  Filled 2022-02-07: qty 10

## 2022-02-07 MED ORDER — SODIUM CHLORIDE 0.9 % IV SOLN: Freq: Once | INTRAVENOUS | Status: AC

## 2022-02-07 MED ORDER — TRILACICLIB DIHYDROCHLORIDE INJECTION 300 MG
240.0000 mg/m2 | Freq: Once | INTRAVENOUS | Status: AC
  Administered 2022-02-07: 450 mg via INTRAVENOUS
  Filled 2022-02-07: qty 30

## 2022-02-07 MED FILL — Dexamethasone Sodium Phosphate Inj 100 MG/10ML: INTRAMUSCULAR | Qty: 1 | Status: AC

## 2022-02-07 NOTE — Progress Notes (Signed)
Per Cassandra Heilingoetter, PA-C - okay to proceed with treatment with HR of 116.  Patient to receive 1 unit PRBC with chemotherapy today.

## 2022-02-07 NOTE — Patient Instructions (Signed)
[facility]   Discharge Instructions: Thank you for choosing [facility] to provide your oncology and hematology care.   If you have a lab appointment with the [facility], please go directly to the [facility] and check in at the registration area.   Wear comfortable clothing and clothing appropriate for easy access to any Portacath or PICC line.   We strive to give you quality time with your provider. You may need to reschedule your appointment if you arrive late (15 or more minutes).  Arriving late affects you and other patients whose appointments are after yours.  Also, if you miss three or more appointments without notifying the office, you may be dismissed from the clinic at the provider's discretion.      For prescription refill requests, have your pharmacy contact our office and allow 72 hours for refills to be completed.    Today you received the following chemotherapy and/or immunotherapy agents: Etoposide, Cosela      To help prevent nausea and vomiting after your treatment, we encourage you to take your nausea medication as directed.  BELOW ARE SYMPTOMS THAT SHOULD BE REPORTED IMMEDIATELY: *FEVER GREATER THAN 100.4 F (38 C) OR HIGHER *CHILLS OR SWEATING *NAUSEA AND VOMITING THAT IS NOT CONTROLLED WITH YOUR NAUSEA MEDICATION *UNUSUAL SHORTNESS OF BREATH *UNUSUAL BRUISING OR BLEEDING *URINARY PROBLEMS (pain or burning when urinating, or frequent urination) *BOWEL PROBLEMS (unusual diarrhea, constipation, pain near the anus) TENDERNESS IN MOUTH AND THROAT WITH OR WITHOUT PRESENCE OF ULCERS (sore throat, sores in mouth, or a toothache) UNUSUAL RASH, SWELLING OR PAIN  UNUSUAL VAGINAL DISCHARGE OR ITCHING   Items with * indicate a potential emergency and should be followed up as soon as possible or go to the Emergency Department if any problems should occur.  Please show the CHEMOTHERAPY ALERT CARD or IMMUNOTHERAPY ALERT  CARD at check-in to the Emergency Department and triage nurse.  Should you have questions after your visit or need to cancel or reschedule your appointment, please contact [facility]  Dept: [phone]  and follow the prompts.  Office hours are 8:00 a.m. to 4:30 p.m. Monday - Friday. Please note that voicemails left after 4:00 p.m. may not be returned until the following business day.  We are closed weekends and major holidays. You have access to a nurse at all times for urgent questions. Please call the main number to the clinic Dept: [phone] and follow the prompts.   For any non-urgent questions, you may also contact your provider using MyChart. We now offer e-Visits for anyone 22 and older to request care online for non-urgent symptoms. For details visit [email or web address].   Also download the MyChart app! Go to the app store, search "MyChart", open the app, select [facility], and log in with your MyChart username and password.

## 2022-02-08 ENCOUNTER — Other Ambulatory Visit: Payer: Self-pay

## 2022-02-08 ENCOUNTER — Emergency Department (HOSPITAL_COMMUNITY): Payer: Medicare Other

## 2022-02-08 ENCOUNTER — Emergency Department (HOSPITAL_COMMUNITY)
Admission: EM | Admit: 2022-02-08 | Discharge: 2022-02-08 | Disposition: A | Discharge status: Home / Self Care | Payer: Medicare Other | Attending: Emergency Medicine | Admitting: Emergency Medicine

## 2022-02-08 ENCOUNTER — Inpatient Hospital Stay: Payer: Medicare Other

## 2022-02-08 ENCOUNTER — Encounter (HOSPITAL_COMMUNITY): Payer: Self-pay

## 2022-02-08 ENCOUNTER — Ambulatory Visit (HOSPITAL_BASED_OUTPATIENT_CLINIC_OR_DEPARTMENT_OTHER): Payer: Medicare Other | Admitting: Physician Assistant

## 2022-02-08 VITALS — BP 160/83 | HR 99 | Temp 98.1°F | Resp 18

## 2022-02-08 DIAGNOSIS — Z5321 Procedure and treatment not carried out due to patient leaving prior to being seen by health care provider: Secondary | ICD-10-CM | POA: Insufficient documentation

## 2022-02-08 DIAGNOSIS — R079 Chest pain, unspecified: Secondary | ICD-10-CM | POA: Insufficient documentation

## 2022-02-08 DIAGNOSIS — D6481 Anemia due to antineoplastic chemotherapy: Secondary | ICD-10-CM

## 2022-02-08 DIAGNOSIS — R519 Headache, unspecified: Secondary | ICD-10-CM | POA: Insufficient documentation

## 2022-02-08 DIAGNOSIS — C3491 Malignant neoplasm of unspecified part of right bronchus or lung: Secondary | ICD-10-CM

## 2022-02-08 DIAGNOSIS — C3490 Malignant neoplasm of unspecified part of unspecified bronchus or lung: Secondary | ICD-10-CM

## 2022-02-08 DIAGNOSIS — D61818 Other pancytopenia: Secondary | ICD-10-CM

## 2022-02-08 LAB — PREPARE RBC (CROSSMATCH)

## 2022-02-08 LAB — T4: T4, Total: 6.4 ug/dL (ref 4.5–12.0)

## 2022-02-08 MED ORDER — SODIUM CHLORIDE 0.9 % IV SOLN
10.0000 mg | Freq: Once | INTRAVENOUS | Status: AC
  Administered 2022-02-08: 10 mg via INTRAVENOUS
  Filled 2022-02-08: qty 10

## 2022-02-08 MED ORDER — SODIUM CHLORIDE 0.9% FLUSH: 10.0000 mL | INTRAVENOUS | Status: DC | PRN

## 2022-02-08 MED ORDER — TRILACICLIB DIHYDROCHLORIDE INJECTION 300 MG
240.0000 mg/m2 | Freq: Once | INTRAVENOUS | Status: AC
  Administered 2022-02-08: 450 mg via INTRAVENOUS
  Filled 2022-02-08: qty 30

## 2022-02-08 MED ORDER — SODIUM CHLORIDE 0.9 % IV SOLN: Freq: Once | INTRAVENOUS | Status: AC

## 2022-02-08 MED ORDER — ACETAMINOPHEN 325 MG PO TABS: 650.0000 mg | ORAL_TABLET | Freq: Once | ORAL | Status: DC

## 2022-02-08 MED ORDER — DIPHENHYDRAMINE HCL 25 MG PO CAPS
25.0000 mg | ORAL_CAPSULE | Freq: Once | ORAL | Status: AC
  Administered 2022-02-08: 25 mg via ORAL
  Filled 2022-02-08: qty 1

## 2022-02-08 MED ORDER — SODIUM CHLORIDE 0.9 % IV SOLN
80.0000 mg/m2 | Freq: Once | INTRAVENOUS | Status: AC
  Administered 2022-02-08: 150 mg via INTRAVENOUS
  Filled 2022-02-08: qty 7.5

## 2022-02-08 MED ORDER — SODIUM CHLORIDE 0.9% IV SOLUTION: 250.0000 mL | Freq: Once | INTRAVENOUS | Status: DC

## 2022-02-08 MED ORDER — HEPARIN SOD (PORK) LOCK FLUSH 100 UNIT/ML IV SOLN: 500.0000 [IU] | Freq: Once | INTRAVENOUS | Status: DC | PRN

## 2022-02-08 NOTE — Progress Notes (Signed)
Patient reported she has been having a headache all day that will not go away. She started pushing on her chest when she was getting up from the chair. She says her chest has been hurting intermitently all day, especially when she takes a deep breath. She denies any extremity weakness or shortness of breath. Notified Cassie, PA and an EKG was ordered. Vitals rechecked: 153/78, HR 110 and 160/83 HR 99 this is higher than her baseline. EKG changes noted. Patient will go to ED from the [facility] for further follow up.

## 2022-02-08 NOTE — ED Triage Notes (Addendum)
Pt presents with c/o chest pain that is intermittent in nature. Pt was at the cancer center for chemo and was sent over here r/t her chest pain. Pt reports she had chemo and blood given yesterday and today.

## 2022-02-08 NOTE — Progress Notes (Signed)
Per Cassie, PA ok to treat today with elevated heart rate of 110 today.

## 2022-02-08 NOTE — ED Provider Triage Note (Signed)
Emergency Medicine Provider Triage Evaluation Note  [name] , a 72 y.o. female  was evaluated in triage.  Pt complains of intermittent chest pain.  Patient was at the cancer center earlier for chemo and was sent to ED for evaluation.  Patient reports she was given chemo treatment and units of blood yesterday and today.  She also reports a headache.  Patient states her chest pain is worse when she takes a deep breath.  Denies URI symptoms, fever, cough.    Review of Systems  Positive: As above Negative: As above  Physical Exam  There were no vitals taken for this visit. Gen:   Awake, no distress   Resp:  Normal effort, lungs clear to auscultation bilaterally MSK:   Moves extremities without difficulty  Other:    Medical Decision Making  Medically screening exam initiated at 3:42 PM.  Appropriate orders placed.  Dereona Kolodny Zertuche was informed that the remainder of the evaluation will be completed by another provider, this initial triage assessment does not replace that evaluation, and the importance of remaining in the ED until their evaluation is complete.     Theressa Stamps R, [place] 02/08/22 1547

## 2022-02-08 NOTE — Patient Instructions (Signed)
[facility]   Discharge Instructions: Thank you for choosing [facility] to provide your oncology and hematology care.   If you have a lab appointment with the [facility], please go directly to the [facility] and check in at the registration area.   Wear comfortable clothing and clothing appropriate for easy access to any Portacath or PICC line.   We strive to give you quality time with your provider. You may need to reschedule your appointment if you arrive late (15 or more minutes).  Arriving late affects you and other patients whose appointments are after yours.  Also, if you miss three or more appointments without notifying the office, you may be dismissed from the clinic at the provider's discretion.      For prescription refill requests, have your pharmacy contact our office and allow 72 hours for refills to be completed.    Today you received the following chemotherapy and/or immunotherapy agents: Etoposide, Cosela      To help prevent nausea and vomiting after your treatment, we encourage you to take your nausea medication as directed.  BELOW ARE SYMPTOMS THAT SHOULD BE REPORTED IMMEDIATELY: *FEVER GREATER THAN 100.4 F (38 C) OR HIGHER *CHILLS OR SWEATING *NAUSEA AND VOMITING THAT IS NOT CONTROLLED WITH YOUR NAUSEA MEDICATION *UNUSUAL SHORTNESS OF BREATH *UNUSUAL BRUISING OR BLEEDING *URINARY PROBLEMS (pain or burning when urinating, or frequent urination) *BOWEL PROBLEMS (unusual diarrhea, constipation, pain near the anus) TENDERNESS IN MOUTH AND THROAT WITH OR WITHOUT PRESENCE OF ULCERS (sore throat, sores in mouth, or a toothache) UNUSUAL RASH, SWELLING OR PAIN  UNUSUAL VAGINAL DISCHARGE OR ITCHING   Items with * indicate a potential emergency and should be followed up as soon as possible or go to the Emergency Department if any problems should occur.  Please show the CHEMOTHERAPY ALERT CARD or IMMUNOTHERAPY ALERT  CARD at check-in to the Emergency Department and triage nurse.  Should you have questions after your visit or need to cancel or reschedule your appointment, please contact [facility]  Dept: [phone]  and follow the prompts.  Office hours are 8:00 a.m. to 4:30 p.m. Monday - Friday. Please note that voicemails left after 4:00 p.m. may not be returned until the following business day.  We are closed weekends and major holidays. You have access to a nurse at all times for urgent questions. Please call the main number to the clinic Dept: [phone] and follow the prompts.   For any non-urgent questions, you may also contact your provider using MyChart. We now offer e-Visits for anyone 75 and older to request care online for non-urgent symptoms. For details visit [email or web address].   Also download the MyChart app! Go to the app store, search "MyChart", open the app, select [facility], and log in with your MyChart username and password.

## 2022-02-08 NOTE — Progress Notes (Signed)
This nurse gave report to ED charge nurse about this patient.  Presentd to the infusion center today for blood transfusion and has had intermittent chest pain 7 out of 10, shortness of breath.  This nurse escorted patient to ED and registered for triage.  No further questions or concerns at this time.

## 2022-02-08 NOTE — Progress Notes (Signed)
The patient was in the infusion room today receiving her second unit of blood this week as well as her third day of chemotherapy with etoposide.  She reportedly has been having intermittent chest pains on and off all day rating it a 7 out of 10 at its worst. She also been having headaches all day. She has some associated symptoms of feeling like she needs to catch her breath when the symptoms arise.  The patient has a history of myocardial infarction back in September 2023.  The patient EKG performed which shows normal heart rate of 92 with sinus rhythm with first-degree AV block which appears new compared to prior EKGs.  Given the patient's anemia this week, history of MI, and EKG changes, would recommend emergency evaluation for further workup and serial troponins to ensure no cardiac etiology of her chest pain.  The patient also mentions that she has increased mucus production the last 2 days.  On exam she has some rhonchi in the right lung.  No acute distress.  Patient was agreeable to the plan.

## 2022-02-09 LAB — TYPE AND SCREEN
ABO/RH(D): O POS
Antibody Screen: NEGATIVE
Unit division: 0
Unit division: 0

## 2022-02-09 LAB — BPAM RBC
Blood Product Expiration Date: 202402252359
Blood Product Expiration Date: 202402272359
ISSUE DATE / TIME: 202401251149
ISSUE DATE / TIME: 202401261218
Unit Type and Rh: 5100
Unit Type and Rh: 5100

## 2022-02-11 ENCOUNTER — Telehealth: Payer: Self-pay | Admitting: Radiation Oncology

## 2022-02-11 ENCOUNTER — Other Ambulatory Visit: Payer: Self-pay | Admitting: Medical Oncology

## 2022-02-11 ENCOUNTER — Inpatient Hospital Stay: Payer: Medicare Other

## 2022-02-11 DIAGNOSIS — C3491 Malignant neoplasm of unspecified part of right bronchus or lung: Secondary | ICD-10-CM

## 2022-02-11 NOTE — Telephone Encounter (Signed)
Called the patient this morning to discuss results of presentation at the multidisciplinary brain/spine conference.  Given the burden of disease elsewhere and the fact that she is asymptomatic in this area no radiation therapy was recommended at this time.  She understands that if she develops pain in this area she would be a candidate for palliative radiation therapy at a later date.

## 2022-02-13 ENCOUNTER — Other Ambulatory Visit: Payer: Self-pay

## 2022-02-13 ENCOUNTER — Inpatient Hospital Stay: Payer: Medicare Other

## 2022-02-13 DIAGNOSIS — D6481 Anemia due to antineoplastic chemotherapy: Secondary | ICD-10-CM

## 2022-02-13 DIAGNOSIS — Z95828 Presence of other vascular implants and grafts: Secondary | ICD-10-CM

## 2022-02-13 DIAGNOSIS — Z5111 Encounter for antineoplastic chemotherapy: Secondary | ICD-10-CM | POA: Diagnosis not present

## 2022-02-13 DIAGNOSIS — C3491 Malignant neoplasm of unspecified part of right bronchus or lung: Secondary | ICD-10-CM

## 2022-02-13 LAB — CMP (CANCER CENTER ONLY)
ALT: 18 U/L (ref 0–44)
AST: 18 U/L (ref 15–41)
Albumin: 3.3 g/dL — ABNORMAL LOW (ref 3.5–5.0)
Alkaline Phosphatase: 105 U/L (ref 38–126)
Anion gap: 8 (ref 5–15)
BUN: 14 mg/dL (ref 8–23)
CO2: 28 mmol/L (ref 22–32)
Calcium: 8.6 mg/dL — ABNORMAL LOW (ref 8.9–10.3)
Chloride: 99 mmol/L (ref 98–111)
Creatinine: 0.73 mg/dL (ref 0.44–1.00)
GFR, Estimated: >60 mL/min (ref >60)
Glucose, Bld: 119 mg/dL — ABNORMAL HIGH (ref 70–99)
Potassium: 3.8 mmol/L (ref 3.5–5.1)
Sodium: 135 mmol/L (ref 135–145)
Total Bilirubin: 0.3 mg/dL (ref 0.3–1.2)
Total Protein: 6.6 g/dL (ref 6.5–8.1)

## 2022-02-13 LAB — CBC WITH DIFFERENTIAL (CANCER CENTER ONLY)
Abs Immature Granulocytes: 0.03 10*3/uL (ref 0.00–0.07)
Basophils Absolute: 0 10*3/uL (ref 0.0–0.1)
Basophils Relative: 1 %
Eosinophils Absolute: 0 10*3/uL (ref 0.0–0.5)
Eosinophils Relative: 2 %
HCT: 29.6 % — ABNORMAL LOW (ref 36.0–46.0)
Hemoglobin: 10.1 g/dL — ABNORMAL LOW (ref 12.0–15.0)
Immature Granulocytes: 1 %
Lymphocytes Relative: 18 %
Lymphs Abs: 0.4 10*3/uL — ABNORMAL LOW (ref 0.7–4.0)
MCH: 30.5 pg (ref 26.0–34.0)
MCHC: 34.1 g/dL (ref 30.0–36.0)
MCV: 89.4 fL (ref 80.0–100.0)
Monocytes Absolute: 0.1 10*3/uL (ref 0.1–1.0)
Monocytes Relative: 3 %
Neutro Abs: 1.8 10*3/uL (ref 1.7–7.7)
Neutrophils Relative %: 75 %
Platelet Count: 163 10*3/uL (ref 150–400)
RBC: 3.31 MIL/uL — ABNORMAL LOW (ref 3.87–5.11)
RDW: 20 % — ABNORMAL HIGH (ref 11.5–15.5)
WBC Count: 2.4 10*3/uL — ABNORMAL LOW (ref 4.0–10.5)
nRBC: 0 % (ref 0.0–0.2)

## 2022-02-13 LAB — SAMPLE TO BLOOD BANK

## 2022-02-13 MED ORDER — SODIUM CHLORIDE 0.9% FLUSH
10.0000 mL | Freq: Once | INTRAVENOUS | Status: AC
  Administered 2022-02-13: 10 mL

## 2022-02-13 MED ORDER — HEPARIN SOD (PORK) LOCK FLUSH 100 UNIT/ML IV SOLN
500.0000 [IU] | Freq: Once | INTRAVENOUS | Status: AC
  Administered 2022-02-13: 500 [IU]

## 2022-02-15 ENCOUNTER — Other Ambulatory Visit: Payer: Self-pay

## 2022-02-19 ENCOUNTER — Other Ambulatory Visit: Payer: Self-pay

## 2022-02-19 ENCOUNTER — Other Ambulatory Visit: Payer: Self-pay | Admitting: Medical Oncology

## 2022-02-19 DIAGNOSIS — C3491 Malignant neoplasm of unspecified part of right bronchus or lung: Secondary | ICD-10-CM

## 2022-02-20 ENCOUNTER — Inpatient Hospital Stay: Payer: Medicare Other | Attending: Radiation Oncology

## 2022-02-20 DIAGNOSIS — C3431 Malignant neoplasm of lower lobe, right bronchus or lung: Secondary | ICD-10-CM | POA: Insufficient documentation

## 2022-02-20 DIAGNOSIS — Z95828 Presence of other vascular implants and grafts: Secondary | ICD-10-CM

## 2022-02-20 DIAGNOSIS — Z5112 Encounter for antineoplastic immunotherapy: Secondary | ICD-10-CM | POA: Insufficient documentation

## 2022-02-20 DIAGNOSIS — Z5111 Encounter for antineoplastic chemotherapy: Secondary | ICD-10-CM | POA: Diagnosis present

## 2022-02-20 DIAGNOSIS — C7951 Secondary malignant neoplasm of bone: Secondary | ICD-10-CM | POA: Diagnosis not present

## 2022-02-20 DIAGNOSIS — C3491 Malignant neoplasm of unspecified part of right bronchus or lung: Secondary | ICD-10-CM

## 2022-02-20 DIAGNOSIS — D6481 Anemia due to antineoplastic chemotherapy: Secondary | ICD-10-CM | POA: Diagnosis not present

## 2022-02-20 DIAGNOSIS — Z79899 Other long term (current) drug therapy: Secondary | ICD-10-CM | POA: Diagnosis not present

## 2022-02-20 DIAGNOSIS — T451X5A Adverse effect of antineoplastic and immunosuppressive drugs, initial encounter: Secondary | ICD-10-CM | POA: Diagnosis not present

## 2022-02-20 LAB — CBC WITH DIFFERENTIAL (CANCER CENTER ONLY)
Abs Immature Granulocytes: 0.01 10*3/uL (ref 0.00–0.07)
Basophils Absolute: 0 10*3/uL (ref 0.0–0.1)
Basophils Relative: 1 %
Eosinophils Absolute: 0.1 10*3/uL (ref 0.0–0.5)
Eosinophils Relative: 3 %
HCT: 25.5 % — ABNORMAL LOW (ref 36.0–46.0)
Hemoglobin: 8.7 g/dL — ABNORMAL LOW (ref 12.0–15.0)
Immature Granulocytes: 0 %
Lymphocytes Relative: 22 %
Lymphs Abs: 0.5 10*3/uL — ABNORMAL LOW (ref 0.7–4.0)
MCH: 30.7 pg (ref 26.0–34.0)
MCHC: 34.1 g/dL (ref 30.0–36.0)
MCV: 90.1 fL (ref 80.0–100.0)
Monocytes Absolute: 0.3 10*3/uL (ref 0.1–1.0)
Monocytes Relative: 14 %
Neutro Abs: 1.4 10*3/uL — ABNORMAL LOW (ref 1.7–7.7)
Neutrophils Relative %: 60 %
Platelet Count: 89 10*3/uL — ABNORMAL LOW (ref 150–400)
RBC: 2.83 MIL/uL — ABNORMAL LOW (ref 3.87–5.11)
RDW: 21 % — ABNORMAL HIGH (ref 11.5–15.5)
WBC Count: 2.4 10*3/uL — ABNORMAL LOW (ref 4.0–10.5)
nRBC: 0 % (ref 0.0–0.2)

## 2022-02-20 LAB — CMP (CANCER CENTER ONLY)
ALT: 21 U/L (ref 0–44)
AST: 18 U/L (ref 15–41)
Albumin: 3.3 g/dL — ABNORMAL LOW (ref 3.5–5.0)
Alkaline Phosphatase: 107 U/L (ref 38–126)
Anion gap: 7 (ref 5–15)
BUN: 12 mg/dL (ref 8–23)
CO2: 26 mmol/L (ref 22–32)
Calcium: 8.2 mg/dL — ABNORMAL LOW (ref 8.9–10.3)
Chloride: 103 mmol/L (ref 98–111)
Creatinine: 0.64 mg/dL (ref 0.44–1.00)
GFR, Estimated: >60 mL/min (ref >60)
Glucose, Bld: 130 mg/dL — ABNORMAL HIGH (ref 70–99)
Potassium: 3.5 mmol/L (ref 3.5–5.1)
Sodium: 136 mmol/L (ref 135–145)
Total Bilirubin: 0.5 mg/dL (ref 0.3–1.2)
Total Protein: 6.6 g/dL (ref 6.5–8.1)

## 2022-02-20 LAB — SAMPLE TO BLOOD BANK

## 2022-02-20 MED ORDER — SODIUM CHLORIDE 0.9% FLUSH
10.0000 mL | Freq: Once | INTRAVENOUS | Status: AC
  Administered 2022-02-20: 10 mL

## 2022-02-20 MED ORDER — HEPARIN SOD (PORK) LOCK FLUSH 100 UNIT/ML IV SOLN
500.0000 [IU] | Freq: Once | INTRAVENOUS | Status: AC
  Administered 2022-02-20: 500 [IU]

## 2022-02-25 NOTE — Progress Notes (Unsigned)
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 are grossly preserved. Only minimal thickening of the right adrenal gland, nonspecific and unchanged. No enhancing renal mass or collecting system dilatation. The uterus has a normal course and caliber down to the bladder. Relatively collapsed urinary bladder. Stomach/Bowel: Mild debris in the stomach which is nondilated. The small bowel is nondilated. Large bowel is of normal course and caliber with scattered stool. Normal retrocecal appendix. Few scattered colonic diverticula greatest of the sigmoid colon. Vascular/Lymphatic: Diffuse vascular calcifications along the aorta and branch vessels with areas of potential stenosis including splenic artery, SMA and iliacs. Preserved IVC. No specific abnormal lymph  node enlargement identified in the abdomen or pelvis. Reproductive: Status post hysterectomy. No adnexal masses. Other: There is streak artifact related to the patient's left hip arthroplasty obscuring portions of the pelvis. There is a neurostimulator along the left hemisacrum with battery pack in the subcutaneous tissues posterior to the left hemipelvis. No ascites. Musculoskeletal: Please  see above. Multiple sclerotic bone lesions identified along the spine. These were seen by prior PET-CT. If needed for follow-up a bone scan can be performed. There is increasing Schmorl's node deformity at the T12 level particularly along the lower endplate. IMPRESSION: Marked improvement in the abnormal mediastinal lymph nodes and right hilar mass as well as ill-defined masslike opacities in the right lower lobe. Mild residual in these locations including some subtle areas of right lower lung nodularity and opacity. Recommend continued follow-up. Again noted are multiple sclerotic bone metastases which were seen is hypermetabolic lesions on the PET-CT scan. Follow up bone scan as clinically appropriate to more directly compare distribution. Slightly increasing compression and Schmorl's node deformity of the inferior endplate of G83. Electronically Signed   By: Jill Side M.D.   On: 02/01/2022 17:59     ASSESSMENT/PLAN:  This is a very pleasant 72 year old Caucasian female diagnosed with extensive stage (T4, N2, M1 C) small cell lung cancer.  The patient presented with a large right lower lobe consolidative mass in addition to right hilar mediastinal lymphadenopathy and extensive bony metastases.  The patient was diagnosed in November 2023.   Patient completed palliative radiotherapy to the left scapular region and right hip under the care of Dr. Sondra Come which was completed on 01/02/2022.   The patient is currently undergoing palliative systemic chemotherapy with carboplatin for an AUC of 5 on day 1, etoposide 100 mg/m on days 1, 2, and 3 with Cosela 240 mg as well as Imfinzi 1500 mg on day 1.  The patient receives treatment IV every 3 weeks.  She is status post 3 cycles.  She does tolerate treatment well although she had cytopenias on labs which require supportive measures such as blood transfusions.   Labs were reviewed.  I reviewed her symptoms with Dr. Julien Nordmann.  Overall the patient is well-appearing  and vitals are normal.  No erythema on exam in her throat.  The patient reports her symptoms are overall mild.    Recommend that she proceed with cycle #4 today as scheduled.  However, the patient and I discussed signs and symptoms that would warrant evaluation sooner.  Should she develop any fevers, worsening fatigue, nasal congestion, worsening sore throat, worsening cough, shortness of breath, etc. she was advised to please call the clinic sooner to be evaluated.  We discussed the importance of staying away from sick contacts while undergoing chemotherapy as she is immunosuppressed.  Starting from her next cycle of treatment she will start maintenance immunotherapy.  I offered the patient a chest x-ray today for the cough but she declined saying that her cough is mild and not constant.  Discussed she may take Robitussin or Delsym over-the-counter for cough.  She does not need a blood transfusion at this time but we will continue to monitor her labs on a weekly basis and arrange for blood transfusion if her hemoglobin is in the sevens.  I have added a sample to blood bank for her weekly labs.  Also discussed that should she develop neutropenia in the interval, that we may call the patient sooner for G-CSF injections.  I will arrange for restaging CT scan  the chest, abdomen, pelvis prior to her next appointment which we will review before starting cycle #5.  She had a follow-up with Dr. Sondra Come on 02/04/22. Dr. Sondra Come mentioned in prior message that can always arrive palliative to other painful bone lesions if needed. She denies pain today   Patient and I reviewed how to use her Emla cream which will hopefully help so it does not hurt to access her port at her next appointment.  I have refilled this and sent it to her pharmacy.  The patient was advised to call immediately if she has any concerning symptoms in the interval. The patient voices understanding of current disease status and treatment options  and is in agreement with the current care plan. All questions were answered. The patient knows to call the clinic with any problems, questions or concerns. We can certainly see the patient much sooner if necessary      No orders of the defined types were placed in this encounter.    The total time spent in the appointment was 20-29 minutes  Iva, PA-C 02/25/22

## 2022-02-26 MED FILL — Fosaprepitant Dimeglumine For IV Infusion 150 MG (Base Eq): INTRAVENOUS | Qty: 5 | Status: AC

## 2022-02-26 MED FILL — Dexamethasone Sodium Phosphate Inj 100 MG/10ML: INTRAMUSCULAR | Qty: 1 | Status: AC

## 2022-02-27 ENCOUNTER — Inpatient Hospital Stay: Payer: Medicare Other

## 2022-02-27 ENCOUNTER — Inpatient Hospital Stay (HOSPITAL_BASED_OUTPATIENT_CLINIC_OR_DEPARTMENT_OTHER): Payer: Medicare Other | Admitting: Physician Assistant

## 2022-02-27 ENCOUNTER — Other Ambulatory Visit: Payer: Self-pay

## 2022-02-27 ENCOUNTER — Other Ambulatory Visit: Payer: Self-pay | Admitting: Physician Assistant

## 2022-02-27 ENCOUNTER — Other Ambulatory Visit: Payer: Medicare Other

## 2022-02-27 VITALS — BP 136/73 | HR 106 | Temp 98.1°F | Resp 17 | Wt 185.0 lb

## 2022-02-27 VITALS — HR 99

## 2022-02-27 DIAGNOSIS — C3491 Malignant neoplasm of unspecified part of right bronchus or lung: Secondary | ICD-10-CM

## 2022-02-27 DIAGNOSIS — Z95828 Presence of other vascular implants and grafts: Secondary | ICD-10-CM

## 2022-02-27 DIAGNOSIS — Z5111 Encounter for antineoplastic chemotherapy: Secondary | ICD-10-CM | POA: Diagnosis not present

## 2022-02-27 DIAGNOSIS — D6481 Anemia due to antineoplastic chemotherapy: Secondary | ICD-10-CM

## 2022-02-27 DIAGNOSIS — Z5112 Encounter for antineoplastic immunotherapy: Secondary | ICD-10-CM

## 2022-02-27 LAB — CBC WITH DIFFERENTIAL (CANCER CENTER ONLY)
Abs Immature Granulocytes: 0.01 10*3/uL (ref 0.00–0.07)
Basophils Absolute: 0 10*3/uL (ref 0.0–0.1)
Basophils Relative: 0 %
Eosinophils Absolute: 0.1 10*3/uL (ref 0.0–0.5)
Eosinophils Relative: 3 %
HCT: 26.5 % — ABNORMAL LOW (ref 36.0–46.0)
Hemoglobin: 9.2 g/dL — ABNORMAL LOW (ref 12.0–15.0)
Immature Granulocytes: 0 %
Lymphocytes Relative: 18 %
Lymphs Abs: 0.5 10*3/uL — ABNORMAL LOW (ref 0.7–4.0)
MCH: 32.1 pg (ref 26.0–34.0)
MCHC: 34.7 g/dL (ref 30.0–36.0)
MCV: 92.3 fL (ref 80.0–100.0)
Monocytes Absolute: 0.5 10*3/uL (ref 0.1–1.0)
Monocytes Relative: 16 %
Neutro Abs: 1.7 10*3/uL (ref 1.7–7.7)
Neutrophils Relative %: 63 %
Platelet Count: 212 10*3/uL (ref 150–400)
RBC: 2.87 MIL/uL — ABNORMAL LOW (ref 3.87–5.11)
RDW: 22 % — ABNORMAL HIGH (ref 11.5–15.5)
WBC Count: 2.7 10*3/uL — ABNORMAL LOW (ref 4.0–10.5)
nRBC: 0 % (ref 0.0–0.2)

## 2022-02-27 LAB — CMP (CANCER CENTER ONLY)
ALT: 13 U/L (ref 0–44)
AST: 12 U/L — ABNORMAL LOW (ref 15–41)
Albumin: 3.6 g/dL (ref 3.5–5.0)
Alkaline Phosphatase: 120 U/L (ref 38–126)
Anion gap: 5 (ref 5–15)
BUN: 8 mg/dL (ref 8–23)
CO2: 31 mmol/L (ref 22–32)
Calcium: 9 mg/dL (ref 8.9–10.3)
Chloride: 101 mmol/L (ref 98–111)
Creatinine: 0.62 mg/dL (ref 0.44–1.00)
GFR, Estimated: >60 mL/min (ref >60)
Glucose, Bld: 117 mg/dL — ABNORMAL HIGH (ref 70–99)
Potassium: 3.5 mmol/L (ref 3.5–5.1)
Sodium: 137 mmol/L (ref 135–145)
Total Bilirubin: 0.3 mg/dL (ref 0.3–1.2)
Total Protein: 6.4 g/dL — ABNORMAL LOW (ref 6.5–8.1)

## 2022-02-27 LAB — SAMPLE TO BLOOD BANK

## 2022-02-27 MED ORDER — LIDOCAINE-PRILOCAINE 2.5-2.5 % EX CREA: TOPICAL_CREAM | CUTANEOUS | 2 refills | Status: DC

## 2022-02-27 MED ORDER — TRILACICLIB DIHYDROCHLORIDE INJECTION 300 MG
240.0000 mg/m2 | Freq: Once | INTRAVENOUS | Status: AC
  Administered 2022-02-27: 450 mg via INTRAVENOUS
  Filled 2022-02-27: qty 30

## 2022-02-27 MED ORDER — SODIUM CHLORIDE 0.9% FLUSH
10.0000 mL | Freq: Once | INTRAVENOUS | Status: AC
  Administered 2022-02-27: 10 mL

## 2022-02-27 MED ORDER — SODIUM CHLORIDE 0.9 % IV SOLN
80.0000 mg/m2 | Freq: Once | INTRAVENOUS | Status: AC
  Administered 2022-02-27: 150 mg via INTRAVENOUS
  Filled 2022-02-27: qty 7.5

## 2022-02-27 MED ORDER — SODIUM CHLORIDE 0.9 % IV SOLN
10.0000 mg | Freq: Once | INTRAVENOUS | Status: AC
  Administered 2022-02-27: 10 mg via INTRAVENOUS
  Filled 2022-02-27: qty 10

## 2022-02-27 MED ORDER — SODIUM CHLORIDE 0.9% FLUSH
10.0000 mL | INTRAVENOUS | Status: DC | PRN
  Administered 2022-02-27: 10 mL

## 2022-02-27 MED ORDER — PALONOSETRON HCL INJECTION 0.25 MG/5ML
0.2500 mg | Freq: Once | INTRAVENOUS | Status: AC
  Administered 2022-02-27: 0.25 mg via INTRAVENOUS
  Filled 2022-02-27: qty 5

## 2022-02-27 MED ORDER — SODIUM CHLORIDE 0.9 % IV SOLN
150.0000 mg | Freq: Once | INTRAVENOUS | Status: AC
  Administered 2022-02-27: 150 mg via INTRAVENOUS
  Filled 2022-02-27: qty 150

## 2022-02-27 MED ORDER — SODIUM CHLORIDE 0.9 % IV SOLN
1500.0000 mg | Freq: Once | INTRAVENOUS | Status: AC
  Administered 2022-02-27: 1500 mg via INTRAVENOUS
  Filled 2022-02-27: qty 30

## 2022-02-27 MED ORDER — SODIUM CHLORIDE 0.9 % IV SOLN: Freq: Once | INTRAVENOUS | Status: AC

## 2022-02-27 MED ORDER — HEPARIN SOD (PORK) LOCK FLUSH 100 UNIT/ML IV SOLN
500.0000 [IU] | Freq: Once | INTRAVENOUS | Status: AC | PRN
  Administered 2022-02-27: 500 [IU]

## 2022-02-27 MED ORDER — SODIUM CHLORIDE 0.9 % IV SOLN
364.4000 mg | Freq: Once | INTRAVENOUS | Status: AC
  Administered 2022-02-27: 350 mg via INTRAVENOUS
  Filled 2022-02-27: qty 35

## 2022-02-27 MED FILL — Dexamethasone Sodium Phosphate Inj 100 MG/10ML: INTRAMUSCULAR | Qty: 1 | Status: AC

## 2022-02-27 NOTE — Patient Instructions (Signed)
[facility]  Discharge Instructions: Thank you for choosing [facility] to provide your oncology and hematology care.   If you have a lab appointment with the [facility], please go directly to the [facility] and check in at the registration area.   Wear comfortable clothing and clothing appropriate for easy access to any Portacath or PICC line.   We strive to give you quality time with your provider. You may need to reschedule your appointment if you arrive late (15 or more minutes).  Arriving late affects you and other patients whose appointments are after yours.  Also, if you miss three or more appointments without notifying the office, you may be dismissed from the clinic at the provider's discretion.      For prescription refill requests, have your pharmacy contact our office and allow 72 hours for refills to be completed.    Today you received the following chemotherapy and/or immunotherapy agents Cosela, Imfinzi, Carboplatin, and Etoposide.     To help prevent nausea and vomiting after your treatment, we encourage you to take your nausea medication as directed.  BELOW ARE SYMPTOMS THAT SHOULD BE REPORTED IMMEDIATELY: *FEVER GREATER THAN 100.4 F (38 C) OR HIGHER *CHILLS OR SWEATING *NAUSEA AND VOMITING THAT IS NOT CONTROLLED WITH YOUR NAUSEA MEDICATION *UNUSUAL SHORTNESS OF BREATH *UNUSUAL BRUISING OR BLEEDING *URINARY PROBLEMS (pain or burning when urinating, or frequent urination) *BOWEL PROBLEMS (unusual diarrhea, constipation, pain near the anus) TENDERNESS IN MOUTH AND THROAT WITH OR WITHOUT PRESENCE OF ULCERS (sore throat, sores in mouth, or a toothache) UNUSUAL RASH, SWELLING OR PAIN  UNUSUAL VAGINAL DISCHARGE OR ITCHING   Items with * indicate a potential emergency and should be followed up as soon as possible or go to the Emergency Department if any problems should occur.  Please show the CHEMOTHERAPY ALERT CARD or  IMMUNOTHERAPY ALERT CARD at check-in to the Emergency Department and triage nurse.  Should you have questions after your visit or need to cancel or reschedule your appointment, please contact [facility]  Dept: [phone]  and follow the prompts.  Office hours are 8:00 a.m. to 4:30 p.m. Monday - Friday. Please note that voicemails left after 4:00 p.m. may not be returned until the following business day.  We are closed weekends and major holidays. You have access to a nurse at all times for urgent questions. Please call the main number to the clinic Dept: [phone] and follow the prompts.   For any non-urgent questions, you may also contact your provider using MyChart. We now offer e-Visits for anyone 19 and older to request care online for non-urgent symptoms. For details visit [email or web address].   Also download the MyChart app! Go to the app store, search "MyChart", open the app, select [facility], and log in with your MyChart username and password.

## 2022-02-27 NOTE — Progress Notes (Signed)
Patient seen by PA today  Vitals are within treatment parameters.  Labs reviewed: and are within treatment parameters.  Per physician team, patient is ready for treatment and there are NO modifications to the treatment plan.

## 2022-02-28 ENCOUNTER — Inpatient Hospital Stay: Payer: Medicare Other

## 2022-02-28 ENCOUNTER — Other Ambulatory Visit: Payer: Self-pay | Admitting: Medical Oncology

## 2022-02-28 ENCOUNTER — Other Ambulatory Visit (HOSPITAL_COMMUNITY): Payer: Self-pay

## 2022-02-28 ENCOUNTER — Telehealth: Payer: Self-pay

## 2022-02-28 VITALS — BP 125/66 | HR 115 | Temp 98.6°F | Resp 18

## 2022-02-28 DIAGNOSIS — Z5112 Encounter for antineoplastic immunotherapy: Secondary | ICD-10-CM | POA: Diagnosis not present

## 2022-02-28 DIAGNOSIS — R519 Headache, unspecified: Secondary | ICD-10-CM

## 2022-02-28 DIAGNOSIS — C3491 Malignant neoplasm of unspecified part of right bronchus or lung: Secondary | ICD-10-CM

## 2022-02-28 MED ORDER — HEPARIN SOD (PORK) LOCK FLUSH 100 UNIT/ML IV SOLN
500.0000 [IU] | Freq: Once | INTRAVENOUS | Status: AC | PRN
  Administered 2022-02-28: 500 [IU]

## 2022-02-28 MED ORDER — SODIUM CHLORIDE 0.9 % IV SOLN
10.0000 mg | Freq: Once | INTRAVENOUS | Status: AC
  Administered 2022-02-28: 10 mg via INTRAVENOUS
  Filled 2022-02-28: qty 10

## 2022-02-28 MED ORDER — SODIUM CHLORIDE 0.9 % IV SOLN: Freq: Once | INTRAVENOUS | Status: AC

## 2022-02-28 MED ORDER — TRILACICLIB DIHYDROCHLORIDE INJECTION 300 MG
240.0000 mg/m2 | Freq: Once | INTRAVENOUS | Status: AC
  Administered 2022-02-28: 450 mg via INTRAVENOUS
  Filled 2022-02-28: qty 30

## 2022-02-28 MED ORDER — OXYCODONE HCL 10 MG PO TABS
ORAL_TABLET | ORAL | 0 refills | Status: DC
  Filled 2022-02-28: qty 180, 30d supply, fill #0

## 2022-02-28 MED ORDER — SODIUM CHLORIDE 0.9 % IV SOLN
80.0000 mg/m2 | Freq: Once | INTRAVENOUS | Status: AC
  Administered 2022-02-28: 150 mg via INTRAVENOUS
  Filled 2022-02-28: qty 7.5

## 2022-02-28 MED ORDER — SODIUM CHLORIDE 0.9% FLUSH
10.0000 mL | INTRAVENOUS | Status: DC | PRN
  Administered 2022-02-28: 10 mL

## 2022-02-28 MED FILL — Dexamethasone Sodium Phosphate Inj 100 MG/10ML: INTRAMUSCULAR | Qty: 1 | Status: AC

## 2022-02-28 NOTE — Patient Instructions (Signed)
[facility]  Discharge Instructions: Thank you for choosing [facility] to provide your oncology and hematology care.   If you have a lab appointment with the [facility], please go directly to the [facility] and check in at the registration area.   Wear comfortable clothing and clothing appropriate for easy access to any Portacath or PICC line.   We strive to give you quality time with your provider. You may need to reschedule your appointment if you arrive late (15 or more minutes).  Arriving late affects you and other patients whose appointments are after yours.  Also, if you miss three or more appointments without notifying the office, you may be dismissed from the clinic at the provider's discretion.      For prescription refill requests, have your pharmacy contact our office and allow 72 hours for refills to be completed.    Today you received the following chemotherapy and/or immunotherapy agents Etoposide and Cosela.     To help prevent nausea and vomiting after your treatment, we encourage you to take your nausea medication as directed.  BELOW ARE SYMPTOMS THAT SHOULD BE REPORTED IMMEDIATELY: *FEVER GREATER THAN 100.4 F (38 C) OR HIGHER *CHILLS OR SWEATING *NAUSEA AND VOMITING THAT IS NOT CONTROLLED WITH YOUR NAUSEA MEDICATION *UNUSUAL SHORTNESS OF BREATH *UNUSUAL BRUISING OR BLEEDING *URINARY PROBLEMS (pain or burning when urinating, or frequent urination) *BOWEL PROBLEMS (unusual diarrhea, constipation, pain near the anus) TENDERNESS IN MOUTH AND THROAT WITH OR WITHOUT PRESENCE OF ULCERS (sore throat, sores in mouth, or a toothache) UNUSUAL RASH, SWELLING OR PAIN  UNUSUAL VAGINAL DISCHARGE OR ITCHING   Items with * indicate a potential emergency and should be followed up as soon as possible or go to the Emergency Department if any problems should occur.  Please show the CHEMOTHERAPY ALERT CARD or IMMUNOTHERAPY ALERT  CARD at check-in to the Emergency Department and triage nurse.  Should you have questions after your visit or need to cancel or reschedule your appointment, please contact [facility]  Dept: [phone]  and follow the prompts.  Office hours are 8:00 a.m. to 4:30 p.m. Monday - Friday. Please note that voicemails left after 4:00 p.m. may not be returned until the following business day.  We are closed weekends and major holidays. You have access to a nurse at all times for urgent questions. Please call the main number to the clinic Dept: [phone] and follow the prompts.   For any non-urgent questions, you may also contact your provider using MyChart. We now offer e-Visits for anyone 100 and older to request care online for non-urgent symptoms. For details visit [email or web address].   Also download the MyChart app! Go to the app store, search "MyChart", open the app, select [facility], and log in with your MyChart username and password.

## 2022-02-28 NOTE — Progress Notes (Signed)
Per Dr. Julien Nordmann it is ok to treat pt today with etoposide c4 day2 and heart rate of 115.

## 2022-02-28 NOTE — Telephone Encounter (Signed)
This nurse reached out to patient related to scheduled infusion for 1 pm today.  Patient did not answer and this nurse unable to leave a message.

## 2022-03-01 ENCOUNTER — Inpatient Hospital Stay: Payer: Medicare Other

## 2022-03-01 ENCOUNTER — Other Ambulatory Visit (HOSPITAL_COMMUNITY): Payer: Self-pay

## 2022-03-01 VITALS — BP 144/65 | HR 85 | Temp 98.7°F | Resp 18

## 2022-03-01 DIAGNOSIS — C3491 Malignant neoplasm of unspecified part of right bronchus or lung: Secondary | ICD-10-CM

## 2022-03-01 DIAGNOSIS — Z5112 Encounter for antineoplastic immunotherapy: Secondary | ICD-10-CM | POA: Diagnosis not present

## 2022-03-01 MED ORDER — HEPARIN SOD (PORK) LOCK FLUSH 100 UNIT/ML IV SOLN
500.0000 [IU] | Freq: Once | INTRAVENOUS | Status: AC | PRN
  Administered 2022-03-01: 500 [IU]

## 2022-03-01 MED ORDER — SODIUM CHLORIDE 0.9 % IV SOLN: Freq: Once | INTRAVENOUS | Status: AC

## 2022-03-01 MED ORDER — SODIUM CHLORIDE 0.9 % IV SOLN
80.0000 mg/m2 | Freq: Once | INTRAVENOUS | Status: AC
  Administered 2022-03-01: 150 mg via INTRAVENOUS
  Filled 2022-03-01: qty 7.5

## 2022-03-01 MED ORDER — SODIUM CHLORIDE 0.9% FLUSH
10.0000 mL | INTRAVENOUS | Status: DC | PRN
  Administered 2022-03-01: 10 mL

## 2022-03-01 MED ORDER — TRILACICLIB DIHYDROCHLORIDE INJECTION 300 MG
240.0000 mg/m2 | Freq: Once | INTRAVENOUS | Status: AC
  Administered 2022-03-01: 450 mg via INTRAVENOUS
  Filled 2022-03-01: qty 30

## 2022-03-01 MED ORDER — SODIUM CHLORIDE 0.9 % IV SOLN
10.0000 mg | Freq: Once | INTRAVENOUS | Status: AC
  Administered 2022-03-01: 10 mg via INTRAVENOUS
  Filled 2022-03-01: qty 10

## 2022-03-01 NOTE — Progress Notes (Signed)
Per [place], PA ok for treatment today with elevated heart rate. Pt. denies complaints of chest pain, dizziness, not shortness of breath noted.

## 2022-03-01 NOTE — Patient Instructions (Signed)
[facility]  Discharge Instructions: Thank you for choosing [facility] to provide your oncology and hematology care.   If you have a lab appointment with the [facility], please go directly to the [facility] and check in at the registration area.   Wear comfortable clothing and clothing appropriate for easy access to any Portacath or PICC line.   We strive to give you quality time with your provider. You may need to reschedule your appointment if you arrive late (15 or more minutes).  Arriving late affects you and other patients whose appointments are after yours.  Also, if you miss three or more appointments without notifying the office, you may be dismissed from the clinic at the provider's discretion.      For prescription refill requests, have your pharmacy contact our office and allow 72 hours for refills to be completed.    Today you received the following chemotherapy and/or immunotherapy agent: Cosela and Etoposide    To help prevent nausea and vomiting after your treatment, we encourage you to take your nausea medication as directed.  BELOW ARE SYMPTOMS THAT SHOULD BE REPORTED IMMEDIATELY: *FEVER GREATER THAN 100.4 F (38 C) OR HIGHER *CHILLS OR SWEATING *NAUSEA AND VOMITING THAT IS NOT CONTROLLED WITH YOUR NAUSEA MEDICATION *UNUSUAL SHORTNESS OF BREATH *UNUSUAL BRUISING OR BLEEDING *URINARY PROBLEMS (pain or burning when urinating, or frequent urination) *BOWEL PROBLEMS (unusual diarrhea, constipation, pain near the anus) TENDERNESS IN MOUTH AND THROAT WITH OR WITHOUT PRESENCE OF ULCERS (sore throat, sores in mouth, or a toothache) UNUSUAL RASH, SWELLING OR PAIN  UNUSUAL VAGINAL DISCHARGE OR ITCHING   Items with * indicate a potential emergency and should be followed up as soon as possible or go to the Emergency Department if any problems should occur.  Please show the CHEMOTHERAPY ALERT CARD or IMMUNOTHERAPY ALERT CARD  at check-in to the Emergency Department and triage nurse.  Should you have questions after your visit or need to cancel or reschedule your appointment, please contact [facility]  Dept: [phone]  and follow the prompts.  Office hours are 8:00 a.m. to 4:30 p.m. Monday - Friday. Please note that voicemails left after 4:00 p.m. may not be returned until the following business day.  We are closed weekends and major holidays. You have access to a nurse at all times for urgent questions. Please call the main number to the clinic Dept: [phone] and follow the prompts.   For any non-urgent questions, you may also contact your provider using MyChart. We now offer e-Visits for anyone 26 and older to request care online for non-urgent symptoms. For details visit [email or web address].   Also download the MyChart app! Go to the app store, search "MyChart", open the app, select [facility], and log in with your MyChart username and password.  Trilaciclib Injection What is this medication? TRILACICLIB (TRYE la SYE klib) prevents low levels of red blood cells, white blood cells, and platelets caused by chemotherapy. It works by protecting the cells in your bone marrow that make these blood cells. This lowers the risk of infection and bleeding. It also reduces the need for blood transfusions. This medicine may be used for other purposes; ask your health care provider or pharmacist if you have questions. COMMON BRAND NAME(S): COSELA What should I tell my care team before I take this medication? They need to know if you have any of these conditions: Liver disease An  unusual or allergic reaction to trilaciclib, other medications, foods, dyes, or preservatives Pregnant or trying to get pregnant Breastfeeding How should I use this medication? This medication is injected into a vein. It is given by your care team in a hospital or clinic setting. Talk to your care team  about the use of this medication in children. Special care may be needed. Overdosage: If you think you have taken too much of this medicine contact a poison control center or emergency room at once. NOTE: This medicine is only for you. Do not share this medicine with others. What if I miss a dose? Keep appointments for follow-up doses. It is important to not miss your dose. Call your care team if you are unable to keep an appointment. What may interact with this medication? Cisplatin Dalfampridine Dofetilide Metformin This list may not describe all possible interactions. Give your health care provider a list of all the medicines, herbs, non-prescription drugs, or dietary supplements you use. Also tell them if you smoke, drink alcohol, or use illegal drugs. Some items may interact with your medicine. What should I watch for while using this medication? Your condition will be monitored carefully while you are receiving this medication. Talk to your care team if you may be pregnant. Serious birth defects can occur if you take this medication during pregnancy and for 3 weeks after the last dose. You will need a negative pregnancy test before starting this medication. Contraception is recommended while taking this medication and for 3 weeks after the last dose. Your care team can help you find the option that works for you. Do not breastfeed while taking this medication and for 3 weeks after the last dose. This medication may cause infertility. Talk to your care team if you are concerned about your fertility. What side effects may I notice from receiving this medication? Side effects that you should report to your care team as soon as possible: Allergic reactions--skin rash, itching, hives, swelling of the face, lips, tongue, or throat Dry cough, shortness of breath or trouble breathing Painful swelling, warmth, or redness of the skin, blisters or sores at the infusion site Side effects that usually  do not require medical attention (report these to your care team if they continue or are bothersome): Fatigue Headache This list may not describe all possible side effects. Call your doctor for medical advice about side effects. You may report side effects to FDA at 1-800-FDA-1088. Where should I keep my medication? This medication is given in a hospital or clinic. It will not be stored at home. NOTE: This sheet is a summary. It may not cover all possible information. If you have questions about this medicine, talk to your doctor, pharmacist, or health care provider.  2023 Elsevier/Gold Standard (2021-08-23 00:00:00)   Etoposide Injection What is this medication? ETOPOSIDE (e toe POE side) treats some types of cancer. It works by slowing down the growth of cancer cells. This medicine may be used for other purposes; ask your health care provider or pharmacist if you have questions. COMMON BRAND NAME(S): Etopophos, Toposar, VePesid What should I tell my care team before I take this medication? They need to know if you have any of these conditions: Infection Kidney disease Liver disease Low blood counts, such as low white cell, platelet, red cell counts An unusual or allergic reaction to etoposide, other medications, foods, dyes, or preservatives If you or your partner are pregnant or trying to get pregnant Breastfeeding How should I use  this medication? This medication is injected into a vein. It is given by your care team in a hospital or clinic setting. Talk to your care team about the use of this medication in children. Special care may be needed. Overdosage: If you think you have taken too much of this medicine contact a poison control center or emergency room at once. NOTE: This medicine is only for you. Do not share this medicine with others. What if I miss a dose? Keep appointments for follow-up doses. It is important not to miss your dose. Call your care team if you are unable to  keep an appointment. What may interact with this medication? Warfarin This list may not describe all possible interactions. Give your health care provider a list of all the medicines, herbs, non-prescription drugs, or dietary supplements you use. Also tell them if you smoke, drink alcohol, or use illegal drugs. Some items may interact with your medicine. What should I watch for while using this medication? Your condition will be monitored carefully while you are receiving this medication. This medication may make you feel generally unwell. This is not uncommon as chemotherapy can affect healthy cells as well as cancer cells. Report any side effects. Continue your course of treatment even though you feel ill unless your care team tells you to stop. This medication can cause serious side effects. To reduce the risk, your care team may give you other medications to take before receiving this one. Be sure to follow the directions from your care team. This medication may increase your risk of getting an infection. Call your care team for advice if you get a fever, chills, sore throat, or other symptoms of a cold or flu. Do not treat yourself. Try to avoid being around people who are sick. This medication may increase your risk to bruise or bleed. Call your care team if you notice any unusual bleeding. Talk to your care team about your risk of cancer. You may be more at risk for certain types of cancers if you take this medication. Talk to your care team if you may be pregnant. Serious birth defects can occur if you take this medication during pregnancy and for 6 months after the last dose. You will need a negative pregnancy test before starting this medication. Contraception is recommended while taking this medication and for 6 months after the last dose. Your care team can help you find the option that works for you. If your partner can get pregnant, use a condom during sex while taking this medication and  for 4 months after the last dose. Do not breastfeed while taking this medication. This medication may cause infertility. Talk to your care team if you are concerned about your fertility. What side effects may I notice from receiving this medication? Side effects that you should report to your care team as soon as possible: Allergic reactions--skin rash, itching, hives, swelling of the face, lips, tongue, or throat Infection--fever, chills, cough, sore throat, wounds that don't heal, pain or trouble when passing urine, general feeling of discomfort or being unwell Low red blood cell level--unusual weakness or fatigue, dizziness, headache, trouble breathing Unusual bruising or bleeding Side effects that usually do not require medical attention (report to your care team if they continue or are bothersome): Diarrhea Fatigue Hair loss Loss of appetite Nausea Vomiting This list may not describe all possible side effects. Call your doctor for medical advice about side effects. You may report side effects to FDA at  1-800-FDA-1088. Where should I keep my medication? This medication is given in a hospital or clinic. It will not be stored at home. NOTE: This sheet is a summary. It may not cover all possible information. If you have questions about this medicine, talk to your doctor, pharmacist, or health care provider.  2023 Elsevier/Gold Standard (2007-02-21 00:00:00)

## 2022-03-05 ENCOUNTER — Telehealth: Payer: Self-pay | Admitting: Internal Medicine

## 2022-03-05 NOTE — Telephone Encounter (Signed)
Called patient regarding all upcoming appointments, patient is notified.

## 2022-03-06 ENCOUNTER — Inpatient Hospital Stay: Payer: Medicare Other

## 2022-03-06 DIAGNOSIS — C3491 Malignant neoplasm of unspecified part of right bronchus or lung: Secondary | ICD-10-CM

## 2022-03-06 DIAGNOSIS — Z5112 Encounter for antineoplastic immunotherapy: Secondary | ICD-10-CM | POA: Diagnosis not present

## 2022-03-06 DIAGNOSIS — Z95828 Presence of other vascular implants and grafts: Secondary | ICD-10-CM

## 2022-03-06 DIAGNOSIS — D6481 Anemia due to antineoplastic chemotherapy: Secondary | ICD-10-CM

## 2022-03-06 LAB — CMP (CANCER CENTER ONLY)
ALT: 17 U/L (ref 0–44)
AST: 16 U/L (ref 15–41)
Albumin: 3.6 g/dL (ref 3.5–5.0)
Alkaline Phosphatase: 88 U/L (ref 38–126)
Anion gap: 10 (ref 5–15)
BUN: 18 mg/dL (ref 8–23)
CO2: 27 mmol/L (ref 22–32)
Calcium: 8.4 mg/dL — ABNORMAL LOW (ref 8.9–10.3)
Chloride: 98 mmol/L (ref 98–111)
Creatinine: 0.75 mg/dL (ref 0.44–1.00)
GFR, Estimated: >60 mL/min (ref >60)
Glucose, Bld: 117 mg/dL — ABNORMAL HIGH (ref 70–99)
Potassium: 3.6 mmol/L (ref 3.5–5.1)
Sodium: 135 mmol/L (ref 135–145)
Total Bilirubin: 0.5 mg/dL (ref 0.3–1.2)
Total Protein: 6.5 g/dL (ref 6.5–8.1)

## 2022-03-06 LAB — CBC WITH DIFFERENTIAL (CANCER CENTER ONLY)
Abs Immature Granulocytes: 0.05 10*3/uL (ref 0.00–0.07)
Basophils Absolute: 0 10*3/uL (ref 0.0–0.1)
Basophils Relative: 0 %
Eosinophils Absolute: 0.1 10*3/uL (ref 0.0–0.5)
Eosinophils Relative: 2 %
HCT: 24.2 % — ABNORMAL LOW (ref 36.0–46.0)
Hemoglobin: 8.5 g/dL — ABNORMAL LOW (ref 12.0–15.0)
Immature Granulocytes: 1 %
Lymphocytes Relative: 15 %
Lymphs Abs: 0.5 10*3/uL — ABNORMAL LOW (ref 0.7–4.0)
MCH: 32.4 pg (ref 26.0–34.0)
MCHC: 35.1 g/dL (ref 30.0–36.0)
MCV: 92.4 fL (ref 80.0–100.0)
Monocytes Absolute: 0.1 10*3/uL (ref 0.1–1.0)
Monocytes Relative: 2 %
Neutro Abs: 2.7 10*3/uL (ref 1.7–7.7)
Neutrophils Relative %: 80 %
Platelet Count: 139 10*3/uL — ABNORMAL LOW (ref 150–400)
RBC: 2.62 MIL/uL — ABNORMAL LOW (ref 3.87–5.11)
RDW: 20.3 % — ABNORMAL HIGH (ref 11.5–15.5)
WBC Count: 3.5 10*3/uL — ABNORMAL LOW (ref 4.0–10.5)
nRBC: 0 % (ref 0.0–0.2)

## 2022-03-06 LAB — SAMPLE TO BLOOD BANK

## 2022-03-06 MED ORDER — SODIUM CHLORIDE 0.9% FLUSH
10.0000 mL | Freq: Once | INTRAVENOUS | Status: AC
  Administered 2022-03-06: 10 mL

## 2022-03-06 MED ORDER — HEPARIN SOD (PORK) LOCK FLUSH 100 UNIT/ML IV SOLN
500.0000 [IU] | Freq: Once | INTRAVENOUS | Status: AC
  Administered 2022-03-06: 500 [IU]

## 2022-03-07 ENCOUNTER — Other Ambulatory Visit: Payer: Self-pay

## 2022-03-08 ENCOUNTER — Other Ambulatory Visit: Payer: Self-pay

## 2022-03-13 ENCOUNTER — Other Ambulatory Visit: Payer: Self-pay | Admitting: Physician Assistant

## 2022-03-13 ENCOUNTER — Inpatient Hospital Stay: Payer: Medicare Other

## 2022-03-13 ENCOUNTER — Telehealth: Payer: Self-pay | Admitting: Medical Oncology

## 2022-03-13 DIAGNOSIS — D6481 Anemia due to antineoplastic chemotherapy: Secondary | ICD-10-CM

## 2022-03-13 DIAGNOSIS — E876 Hypokalemia: Secondary | ICD-10-CM

## 2022-03-13 DIAGNOSIS — Z5112 Encounter for antineoplastic immunotherapy: Secondary | ICD-10-CM | POA: Diagnosis not present

## 2022-03-13 DIAGNOSIS — C3491 Malignant neoplasm of unspecified part of right bronchus or lung: Secondary | ICD-10-CM

## 2022-03-13 DIAGNOSIS — Z95828 Presence of other vascular implants and grafts: Secondary | ICD-10-CM

## 2022-03-13 LAB — CBC WITH DIFFERENTIAL (CANCER CENTER ONLY)
Abs Immature Granulocytes: 0.01 10*3/uL (ref 0.00–0.07)
Basophils Absolute: 0 10*3/uL (ref 0.0–0.1)
Basophils Relative: 1 %
Eosinophils Absolute: 0.1 10*3/uL (ref 0.0–0.5)
Eosinophils Relative: 3 %
HCT: 21 % — ABNORMAL LOW (ref 36.0–46.0)
Hemoglobin: 7.3 g/dL — ABNORMAL LOW (ref 12.0–15.0)
Immature Granulocytes: 1 %
Lymphocytes Relative: 24 %
Lymphs Abs: 0.5 10*3/uL — ABNORMAL LOW (ref 0.7–4.0)
MCH: 32.7 pg (ref 26.0–34.0)
MCHC: 34.8 g/dL (ref 30.0–36.0)
MCV: 94.2 fL (ref 80.0–100.0)
Monocytes Absolute: 0.3 10*3/uL (ref 0.1–1.0)
Monocytes Relative: 13 %
Neutro Abs: 1.2 10*3/uL — ABNORMAL LOW (ref 1.7–7.7)
Neutrophils Relative %: 58 %
Platelet Count: 50 10*3/uL — ABNORMAL LOW (ref 150–400)
RBC: 2.23 MIL/uL — ABNORMAL LOW (ref 3.87–5.11)
RDW: 21.9 % — ABNORMAL HIGH (ref 11.5–15.5)
WBC Count: 2 10*3/uL — ABNORMAL LOW (ref 4.0–10.5)
nRBC: 0 % (ref 0.0–0.2)

## 2022-03-13 LAB — CMP (CANCER CENTER ONLY)
ALT: 11 U/L (ref 0–44)
AST: 11 U/L — ABNORMAL LOW (ref 15–41)
Albumin: 3.6 g/dL (ref 3.5–5.0)
Alkaline Phosphatase: 102 U/L (ref 38–126)
Anion gap: 7 (ref 5–15)
BUN: 10 mg/dL (ref 8–23)
CO2: 29 mmol/L (ref 22–32)
Calcium: 8 mg/dL — ABNORMAL LOW (ref 8.9–10.3)
Chloride: 100 mmol/L (ref 98–111)
Creatinine: 0.63 mg/dL (ref 0.44–1.00)
GFR, Estimated: >60 mL/min (ref >60)
Glucose, Bld: 136 mg/dL — ABNORMAL HIGH (ref 70–99)
Potassium: 3 mmol/L — ABNORMAL LOW (ref 3.5–5.1)
Sodium: 136 mmol/L (ref 135–145)
Total Bilirubin: 0.3 mg/dL (ref 0.3–1.2)
Total Protein: 5.7 g/dL — ABNORMAL LOW (ref 6.5–8.1)

## 2022-03-13 LAB — SAMPLE TO BLOOD BANK

## 2022-03-13 LAB — PREPARE RBC (CROSSMATCH)

## 2022-03-13 MED ORDER — HEPARIN SOD (PORK) LOCK FLUSH 100 UNIT/ML IV SOLN
500.0000 [IU] | Freq: Once | INTRAVENOUS | Status: AC
  Administered 2022-03-13: 500 [IU]

## 2022-03-13 MED ORDER — POTASSIUM CHLORIDE CRYS ER 20 MEQ PO TBCR: 20.0000 meq | EXTENDED_RELEASE_TABLET | Freq: Two times a day (BID) | ORAL | 0 refills | Status: DC

## 2022-03-13 MED ORDER — SODIUM CHLORIDE 0.9% FLUSH
10.0000 mL | Freq: Once | INTRAVENOUS | Status: AC
  Administered 2022-03-13: 10 mL

## 2022-03-13 NOTE — Telephone Encounter (Signed)
[name] confirmed appt tomorrow for blood transfusion tomorrow and to pick up rx for [facility].

## 2022-03-13 NOTE — Telephone Encounter (Signed)
LVM to return my call. Dtr notified of appt for blood and to pick up kclor. She gave me her dad's cell phone number and I entered in chart.

## 2022-03-14 ENCOUNTER — Inpatient Hospital Stay: Payer: Medicare Other

## 2022-03-14 DIAGNOSIS — Z5112 Encounter for antineoplastic immunotherapy: Secondary | ICD-10-CM | POA: Diagnosis not present

## 2022-03-14 DIAGNOSIS — D6481 Anemia due to antineoplastic chemotherapy: Secondary | ICD-10-CM

## 2022-03-14 MED ORDER — HEPARIN SOD (PORK) LOCK FLUSH 100 UNIT/ML IV SOLN: 250.0000 [IU] | INTRAVENOUS | Status: DC | PRN

## 2022-03-14 MED ORDER — SODIUM CHLORIDE 0.9% IV SOLUTION
250.0000 mL | Freq: Once | INTRAVENOUS | Status: AC
  Administered 2022-03-14: 250 mL via INTRAVENOUS

## 2022-03-14 MED ORDER — ACETAMINOPHEN 325 MG PO TABS
650.0000 mg | ORAL_TABLET | Freq: Once | ORAL | Status: AC
  Administered 2022-03-14: 650 mg via ORAL
  Filled 2022-03-14: qty 2

## 2022-03-14 MED ORDER — SODIUM CHLORIDE 0.9% FLUSH: 10.0000 mL | INTRAVENOUS | Status: DC | PRN

## 2022-03-14 MED ORDER — DIPHENHYDRAMINE HCL 25 MG PO CAPS
25.0000 mg | ORAL_CAPSULE | Freq: Once | ORAL | Status: AC
  Administered 2022-03-14: 25 mg via ORAL
  Filled 2022-03-14: qty 1

## 2022-03-14 NOTE — Patient Instructions (Signed)

## 2022-03-15 ENCOUNTER — Ambulatory Visit (HOSPITAL_COMMUNITY)
Admission: RE | Admit: 2022-03-15 | Discharge: 2022-03-15 | Disposition: A | Discharge status: Home / Self Care | Payer: Medicare Other | Source: Ambulatory Visit | Attending: Physician Assistant | Admitting: Physician Assistant

## 2022-03-15 DIAGNOSIS — C3491 Malignant neoplasm of unspecified part of right bronchus or lung: Secondary | ICD-10-CM | POA: Insufficient documentation

## 2022-03-15 LAB — BPAM RBC
Blood Product Expiration Date: 202403292359
Blood Product Expiration Date: 202403302359
ISSUE DATE / TIME: 202402290903
ISSUE DATE / TIME: 202402290903
Unit Type and Rh: 5100
Unit Type and Rh: 5100

## 2022-03-15 LAB — TYPE AND SCREEN
ABO/RH(D): O POS
Antibody Screen: NEGATIVE
Unit division: 0
Unit division: 0

## 2022-03-15 MED ORDER — SODIUM CHLORIDE (PF) 0.9 % IJ SOLN
INTRAMUSCULAR | Status: AC
  Filled 2022-03-15: qty 50

## 2022-03-15 MED ORDER — IOHEXOL 300 MG/ML  SOLN
100.0000 mL | Freq: Once | INTRAMUSCULAR | Status: AC | PRN
  Administered 2022-03-15: 100 mL via INTRAVENOUS

## 2022-03-20 ENCOUNTER — Encounter: Payer: Self-pay | Admitting: Medical Oncology

## 2022-03-20 ENCOUNTER — Inpatient Hospital Stay: Payer: Medicare Other

## 2022-03-20 ENCOUNTER — Inpatient Hospital Stay: Payer: Medicare Other | Attending: Radiation Oncology | Admitting: Internal Medicine

## 2022-03-20 VITALS — BP 135/73 | HR 98 | Resp 16

## 2022-03-20 VITALS — BP 155/81 | HR 85 | Temp 98.2°F | Resp 17 | Wt 185.3 lb

## 2022-03-20 DIAGNOSIS — Z5112 Encounter for antineoplastic immunotherapy: Secondary | ICD-10-CM | POA: Insufficient documentation

## 2022-03-20 DIAGNOSIS — C3491 Malignant neoplasm of unspecified part of right bronchus or lung: Secondary | ICD-10-CM

## 2022-03-20 DIAGNOSIS — C7951 Secondary malignant neoplasm of bone: Secondary | ICD-10-CM | POA: Insufficient documentation

## 2022-03-20 DIAGNOSIS — C3431 Malignant neoplasm of lower lobe, right bronchus or lung: Secondary | ICD-10-CM | POA: Insufficient documentation

## 2022-03-20 DIAGNOSIS — Z95828 Presence of other vascular implants and grafts: Secondary | ICD-10-CM

## 2022-03-20 LAB — CBC WITH DIFFERENTIAL (CANCER CENTER ONLY)
Abs Immature Granulocytes: 0.01 10*3/uL (ref 0.00–0.07)
Basophils Absolute: 0 10*3/uL (ref 0.0–0.1)
Basophils Relative: 0 %
Eosinophils Absolute: 0.1 10*3/uL (ref 0.0–0.5)
Eosinophils Relative: 4 %
HCT: 30.2 % — ABNORMAL LOW (ref 36.0–46.0)
Hemoglobin: 10.5 g/dL — ABNORMAL LOW (ref 12.0–15.0)
Immature Granulocytes: 0 %
Lymphocytes Relative: 16 %
Lymphs Abs: 0.5 10*3/uL — ABNORMAL LOW (ref 0.7–4.0)
MCH: 32.2 pg (ref 26.0–34.0)
MCHC: 34.8 g/dL (ref 30.0–36.0)
MCV: 92.6 fL (ref 80.0–100.0)
Monocytes Absolute: 0.5 10*3/uL (ref 0.1–1.0)
Monocytes Relative: 16 %
Neutro Abs: 2 10*3/uL (ref 1.7–7.7)
Neutrophils Relative %: 64 %
Platelet Count: 109 10*3/uL — ABNORMAL LOW (ref 150–400)
RBC: 3.26 MIL/uL — ABNORMAL LOW (ref 3.87–5.11)
RDW: 20 % — ABNORMAL HIGH (ref 11.5–15.5)
WBC Count: 3.1 10*3/uL — ABNORMAL LOW (ref 4.0–10.5)
nRBC: 0 % (ref 0.0–0.2)

## 2022-03-20 LAB — CMP (CANCER CENTER ONLY)
ALT: 9 U/L (ref 0–44)
AST: 12 U/L — ABNORMAL LOW (ref 15–41)
Albumin: 3.7 g/dL (ref 3.5–5.0)
Alkaline Phosphatase: 102 U/L (ref 38–126)
Anion gap: 6 (ref 5–15)
BUN: 10 mg/dL (ref 8–23)
CO2: 30 mmol/L (ref 22–32)
Calcium: 8.2 mg/dL — ABNORMAL LOW (ref 8.9–10.3)
Chloride: 99 mmol/L (ref 98–111)
Creatinine: 0.67 mg/dL (ref 0.44–1.00)
GFR, Estimated: >60 mL/min (ref >60)
Glucose, Bld: 103 mg/dL — ABNORMAL HIGH (ref 70–99)
Potassium: 3.9 mmol/L (ref 3.5–5.1)
Sodium: 135 mmol/L (ref 135–145)
Total Bilirubin: 0.4 mg/dL (ref 0.3–1.2)
Total Protein: 6.6 g/dL (ref 6.5–8.1)

## 2022-03-20 LAB — SAMPLE TO BLOOD BANK

## 2022-03-20 MED ORDER — SODIUM CHLORIDE 0.9 % IV SOLN
1500.0000 mg | Freq: Once | INTRAVENOUS | Status: AC
  Administered 2022-03-20: 1500 mg via INTRAVENOUS
  Filled 2022-03-20: qty 30

## 2022-03-20 MED ORDER — SODIUM CHLORIDE 0.9% FLUSH
10.0000 mL | INTRAVENOUS | Status: DC | PRN
  Administered 2022-03-20: 10 mL

## 2022-03-20 MED ORDER — HEPARIN SOD (PORK) LOCK FLUSH 100 UNIT/ML IV SOLN
500.0000 [IU] | Freq: Once | INTRAVENOUS | Status: AC | PRN
  Administered 2022-03-20: 500 [IU]

## 2022-03-20 MED ORDER — SODIUM CHLORIDE 0.9 % IV SOLN: Freq: Once | INTRAVENOUS | Status: AC

## 2022-03-20 MED ORDER — SODIUM CHLORIDE 0.9% FLUSH
10.0000 mL | Freq: Once | INTRAVENOUS | Status: AC
  Administered 2022-03-20: 10 mL

## 2022-03-20 NOTE — Progress Notes (Signed)
Patient seen by MD today  Vitals are within treatment parameters.  Labs reviewed: and are within treatment parameters.  Per physician team, patient is ready for treatment and there are NO modifications to the treatment plan.

## 2022-03-20 NOTE — Progress Notes (Signed)
[facility] [phone]   [phone]  OFFICE PROGRESS NOTE  Alvester Chou, NP [address] [place] [address]  DIAGNOSIS: Extensive stage (T4, N2, M1 C) small cell lung cancer presented with large right lower lobe consolidative mass in addition to right hilar and mediastinal lymphadenopathy and extensive bone metastasis diagnosed in November 2023.   PRIOR THERAPY: None  CURRENT THERAPY: Palliative systemic chemotherapy with carboplatin for AUC of 5 on day 1, etoposide 100 Mg/M2 on days 1, 2 and 3 with Cosela 240 Mg/M2 on the days of the chemotherapy as well as Imfinzi 1500 mg IV on day 1 every 3 weeks during the induction phase and then every 4 weeks during the maintenance if there is no evidence for disease progression.  Status post 4 cycles.  Starting from cycle #5 she will be on maintenance treatment with Imfinzi 1500 Mg IV every 4 weeks.  INTERVAL HISTORY: [name] 72 y.o. female returns to the clinic today for follow-up visit accompanied by her husband.  The patient is feeling fine today with no concerning complaints except for arthralgia in her right hip and knee.  She denied having any current chest pain, shortness of breath but continues to have chronic cough with no hemoptysis.  She has no nausea, vomiting, diarrhea or constipation.  She has no headache or visual changes.  She denied having any significant weight loss or night sweats.  She has no fever or chills.  She has been tolerating her treatment with systemic chemotherapy and immunotherapy fairly well.  She is here today for evaluation with repeat CT scan of the chest, abdomen and pelvis for restaging of her disease.  MEDICAL HISTORY: Past Medical History:  Diagnosis Date   Anxiety    Aortic atherosclerosis (HCC)    Bilateral cataracts    Carpal tunnel syndrome    Cervical cancer (HCC)    Chronic back pain    Claudication (HCC)    COPD (chronic obstructive pulmonary  disease) (HCC)    wheezing   DDD (degenerative disc disease), cervical    DDD (degenerative disc disease), lumbar    Depression    Diverticulosis    Dyspnea    on exertion   Gastroparesis    GERD (gastroesophageal reflux disease)    Hearing loss    Bilateral   History of blood transfusion    History of colon polyps    History of migraine    History of radiation therapy    Left scapula,left chest, right pelvis- 12/20/21-01/02/22- Dr. 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  MEDICATIONS:  Current Outpatient Medications  Medication Sig Dispense Refill   acetaminophen (TYLENOL) 325 MG tablet Take 650 mg by mouth 2 (two) times daily as needed for headache.     albuterol (PROVENTIL) (2.5 MG/3ML) 0.083% nebulizer solution Inhale 3 mLs into the lungs every 4 (four) hours as needed for wheezing or shortness of breath (cough). (Patient taking differently: Inhale 3 mLs into the lungs 2 (two) times daily as needed for wheezing or shortness of breath (cough).) 75 mL 12   albuterol (VENTOLIN HFA) 108 (90 Base) MCG/ACT inhaler Inhale 2 puffs into the lungs 2 (two) times daily as needed for wheezing or shortness of breath.     aspirin EC 81 MG tablet Take 1 tablet (81  mg total) by mouth daily. Swallow whole. 150 tablet 2   benzonatate (TESSALON) 100 MG capsule Take 1 capsule (100 mg total) by mouth 2 (two) times daily as needed for cough. 20 capsule 0   busPIRone (BUSPAR) 5 MG tablet Take 1 tablet (5 mg total) by mouth 2 (two) times daily. 60 tablet 0   Cholecalciferol (VITAMIN D-3 PO) Take 2,000 Units by mouth daily.     cyanocobalamin (VITAMIN B12) 1000 MCG/ML injection Inject 1,000 mcg into the muscle every 30 (thirty) days.     docusate sodium (COLACE) 100 MG capsule Take 200 mg by mouth daily as needed (constipation).     DULoxetine (CYMBALTA) 60 MG capsule Take 60 mg by mouth at bedtime.     ferrous sulfate 324 MG TBEC Take 324 mg by mouth. (Patient not taking: Reported on 02/06/2022)     guaiFENesin-codeine 100-10 MG/5ML syrup Take 10 mLs by mouth 3 (three) times daily as needed.     hydrocortisone (ANUSOL-HC) 25 MG suppository Place 1 suppository (25 mg total) rectally 2 (two) times daily as needed for hemorrhoids or anal itching. 12 suppository 0   lidocaine-prilocaine (EMLA) cream Apply 30 minutes before appointment 30 g 2   linaclotide (LINZESS) 290 MCG CAPS capsule Take 1 capsule (290 mcg total) by mouth daily before breakfast. (Patient taking differently: Take 290 mcg by mouth daily as needed (Constipation).) 30 capsule 1   losartan (COZAAR) 25 MG tablet Take 25 mg by mouth daily.     methocarbamol (ROBAXIN) 500 MG tablet Take 1 tablet (500 mg total) by mouth every 6 (six) hours as needed for muscle spasms. 60 tablet 0   metoCLOPramide (REGLAN) 5 MG tablet Take 1 tablet by mouth 3 times daily 20-30 minutes before meals NO FURTHER REFILLS UNTIL SEEN, NEEDS AND APPOINTMENT (Patient taking differently: Take 5 mg by mouth 3 (three) times daily before meals.) 90 tablet 0   nitroGLYCERIN (NITROSTAT) 0.4 MG SL tablet Place 0.4 mg under the tongue every 5 (five) minutes x 3 doses as needed for chest pain.  1   Oxycodone HCl 10 MG TABS Take 10 mg by mouth 5  (five) times daily as needed (for moderate pain). (Patient not taking: Reported on 02/06/2022)     Oxycodone HCl 10 MG TABS Take 1 tablet (10 mg total) by mouth 5 (five) times daily as needed for pain. (Patient not taking: Reported on 02/06/2022) 150 tablet 0   Oxycodone HCl 10 MG TABS Take 1 tablet (10 mg total) by mouth every 4 (four) hours as needed for pain. 180 tablet 0   Oxycodone HCl 10 MG TABS Take 1 tablet by mouth every 4 hours as needed  for pain 180 tablet 0   pantoprazole (PROTONIX) 40 MG tablet Take 40 mg by mouth daily as needed (acid reflux).     potassium chloride SA (KLOR-CON M) 20 MEQ tablet Take 1 tablet (20 mEq total) by mouth 2 (two) times daily. 14 tablet 0   prochlorperazine (COMPAZINE) 10 MG tablet Take 1 tablet (10 mg total) by mouth every 6 (six) hours as needed for nausea or vomiting. 30 tablet 0   rOPINIRole (REQUIP) 1 MG tablet Take 1 mg by mouth 2 (two) times daily.     rosuvastatin (CRESTOR) 10 MG tablet Take 10 mg by mouth at bedtime.     senna (SENOKOT) 8.6 MG tablet Take 17.2 mg by mouth daily as needed for constipation.     Tiotropium Bromide-Olodaterol (STIOLTO RESPIMAT) 2.5-2.5 MCG/ACT AERS Inhale 2 puffs into the lungs daily. (Patient not taking: Reported on 02/06/2022) 1 each 5   Tiotropium Bromide-Olodaterol (STIOLTO RESPIMAT) 2.5-2.5 MCG/ACT AERS Inhale 2 puffs into the lungs daily. (Patient not taking: Reported on 02/06/2022) 4 g 0   traZODone (DESYREL) 50 MG tablet TAKE 0.5-1 TABLETS (25-50 MG TOTAL) BY MOUTH AT BEDTIME AS NEEDED FOR SLEEP. (Patient taking differently: Take 50 mg by mouth at bedtime.) 90 tablet 1   No current facility-administered medications for this visit.    SURGICAL HISTORY:  Past Surgical History:  Procedure Laterality Date   BACK SURGERY     BIOPSY  08/31/2021   Procedure: BIOPSY;  Surgeon: Thornton Park, MD;  Location: [facility] ENDOSCOPY;  Service: Gastroenterology;;   BLEPHAROPLASTY  10/10/2017   BRONCHIAL BIOPSY  11/21/2021    Procedure: BRONCHIAL BIOPSIES;  Surgeon: Rigoberto Noel, MD;  Location: [facility] ENDOSCOPY;  Service: Cardiopulmonary;;   BRONCHIAL BRUSHINGS  11/21/2021   Procedure: BRONCHIAL BRUSHINGS;  Surgeon: Rigoberto Noel, MD;  Location: [place];  Service: Cardiopulmonary;;   BRONCHIAL NEEDLE ASPIRATION BIOPSY  11/21/2021   Procedure: BRONCHIAL NEEDLE ASPIRATION BIOPSIES;  Surgeon: Rigoberto Noel, MD;  Location: [place];  Service: Cardiopulmonary;;   BRONCHIAL WASHINGS  11/21/2021   Procedure: BRONCHIAL WASHINGS;  Surgeon: Rigoberto Noel, MD;  Location: [place];  Service: Cardiopulmonary;;   CATARACT EXTRACTION, BILATERAL     CESAREAN SECTION     CHOLECYSTECTOMY     COLONOSCOPY  10/2016   COLONOSCOPY WITH PROPOFOL N/A 08/31/2021   Procedure: COLONOSCOPY WITH PROPOFOL;  Surgeon: Thornton Park, MD;  Location: [place];  Service: Gastroenterology;  Laterality: N/A;   ESOPHAGOGASTRODUODENOSCOPY (EGD) WITH PROPOFOL N/A 08/31/2021   Procedure: ESOPHAGOGASTRODUODENOSCOPY (EGD) WITH PROPOFOL;  Surgeon: Thornton Park, MD;  Location: [place];  Service: Gastroenterology;  Laterality: N/A;   HAND SURGERY Bilateral    carpal tunnel   HEMORRHOID SURGERY     HEMOSTASIS CONTROL  11/21/2021   Procedure: HEMOSTASIS CONTROL;  Surgeon: Rigoberto Noel, MD;  Location: [facility] ENDOSCOPY;  Service: Cardiopulmonary;;   INTERSTIM IMPLANT PLACEMENT     IR IMAGING GUIDED PORT INSERTION  01/17/2022   KNEE SURGERY Left    arthroscopy x3   LOWER EXTREMITY ANGIOGRAPHY N/A 07/16/2016   Procedure: Lower Extremity Angiography;  Surgeon: Adrian Prows, MD;  Location: [place] CV LAB;  Service: Cardiovascular;  Laterality: N/A;   LOWER EXTREMITY INTERVENTION N/A 08/06/2016   Procedure: Lower Extremity Intervention;  Surgeon: Adrian Prows, MD;  Location: [place] CV [facility];  Service: Cardiovascular;  Laterality: N/A;   NECK SURGERY     OTHER SURGICAL HISTORY  2013   bladder stimulator in back   PERIPHERAL VASCULAR BALLOON  ANGIOPLASTY  08/06/2016   Procedure: Peripheral Vascular Balloon Angioplasty;  Surgeon: Adrian Prows, MD;  Location: [place] CV LAB;  Service: Cardiovascular;;  Right SFA   POLYPECTOMY  08/31/2021   Procedure: POLYPECTOMY;  Surgeon: Thornton Park, MD;  Location: [facility] ENDOSCOPY;  Service: Gastroenterology;;   TENNIS ELBOW RELEASE/NIRSCHEL PROCEDURE Right 12/2015   TOE SURGERY Right    right foot second and fourth toe   TOTAL HIP ARTHROPLASTY Left    TOTAL HIP REVISION Left 10/13/2017   Procedure: LEFT TOTAL HIP REVISION ACETABULUM, OPEN REDUCTION INTERNAL WITH BONE GRAFT FIXATION LEFT GREATER TROCHANTERIC FRACTURE;  Surgeon: Frederik Pear, MD;  Location: [facility];  Service: Orthopedics;  Laterality: Left;   TOTAL KNEE ARTHROPLASTY Right 01/12/2016   Procedure: RIGHT TOTAL KNEE ARTHROPLASTY;  Surgeon: Netta Cedars, MD;  Location: [place];  Service: Orthopedics;  Laterality: Right;   TUBAL LIGATION     UPPER GI ENDOSCOPY  10/2016   VAGINAL HYSTERECTOMY     VIDEO BRONCHOSCOPY WITH ENDOBRONCHIAL ULTRASOUND N/A 11/21/2021   Procedure: VIDEO BRONCHOSCOPY WITH ENDOBRONCHIAL ULTRASOUND;  Surgeon: Rigoberto Noel, MD;  Location: [place];  Service: Cardiopulmonary;  Laterality: N/A;    REVIEW OF SYSTEMS:  Constitutional: positive for fatigue Eyes: negative Ears, nose, mouth, throat, and face: negative Respiratory: positive for cough and dyspnea on exertion Cardiovascular: negative Gastrointestinal: negative Genitourinary:negative Integument/breast: negative Hematologic/lymphatic: negative Musculoskeletal:positive for arthralgias Neurological: negative Behavioral/Psych: negative Endocrine: negative Allergic/Immunologic: negative   PHYSICAL EXAMINATION: General appearance: alert, cooperative, fatigued, and no distress Head: Normocephalic, without obvious abnormality, atraumatic Neck: no adenopathy, no JVD, supple, symmetrical, trachea midline, and thyroid not enlarged, symmetric, no  tenderness/mass/nodules Lymph nodes: Cervical, supraclavicular, and axillary nodes normal. Resp: clear to auscultation bilaterally Back: symmetric, no curvature. ROM normal. No CVA tenderness. Cardio: regular rate and rhythm, S1, S2 normal, no murmur, click, rub or gallop GI: soft, non-tender; bowel sounds normal; no masses,  no organomegaly Extremities: extremities normal, atraumatic, no cyanosis or edema Neurologic: Alert and oriented X 3, normal strength and tone. Normal symmetric reflexes. Normal coordination and gait  ECOG PERFORMANCE STATUS: 1 - Symptomatic but completely ambulatory  Blood pressure (!) 155/81, pulse 85, temperature 98.2 F (36.8 C), temperature source Oral, resp. rate 17, weight 185 lb 5 oz (84.1 kg), SpO2 98 %.  LABORATORY DATA: Lab Results  Component Value Date   WBC 3.1 (L) 03/20/2022   HGB 10.5 (L) 03/20/2022   HCT 30.2 (L) 03/20/2022   MCV 92.6 03/20/2022   PLT 109 (L) 03/20/2022      Chemistry      Component Value Date/Time   NA 136 03/13/2022 1337   NA 140 09/28/2019 0948   K 3.0 (L) 03/13/2022 1337   K 3.5 03/19/2011 0722   CL 100 03/13/2022 1337   CO2 29 03/13/2022 1337   BUN 10 03/13/2022 1337   BUN 12 09/28/2019 0948   CREATININE 0.63 03/13/2022 1337      Component Value Date/Time   CALCIUM 8.0 (L) 03/13/2022 1337   ALKPHOS 102 03/13/2022 1337   AST 11 (L) 03/13/2022 1337   ALT 11 03/13/2022 1337   BILITOT 0.3 03/13/2022 1337       RADIOGRAPHIC STUDIES: CT Chest W Contrast  Result Date: 03/15/2022 CLINICAL DATA:  Small-cell lung cancer. Metastatic disease. On chemotherapy. Finished XRT. * Tracking Code: BO * additional remote history of cervical cancer. EXAM: CT CHEST, ABDOMEN, AND PELVIS WITH CONTRAST TECHNIQUE: Multidetector CT imaging of the chest, abdomen and pelvis was performed following the  standard protocol during bolus administration of intravenous contrast. RADIATION DOSE REDUCTION: This exam was performed according to the  departmental dose-optimization program which includes automated exposure control, adjustment of the mA and/or kV according to patient size and/or use of iterative reconstruction technique. CONTRAST:  142m OMNIPAQUE IOHEXOL 300 MG/ML  SOLN COMPARISON:  CT 02/01/2022 and older FINDINGS: CT CHEST FINDINGS Cardiovascular: Right upper chest port. Heart is nonenlarged. No significant pericardial effusion. Coronary artery calcifications are seen. The thoracic aorta has an overall normal course and caliber with mild atherosclerotic plaque. Mediastinum/Nodes: Small thyroid gland. Slight wall thickening along the midthoracic esophagus, unchanged from previous and nonspecific. Specific abnormal lymph node enlargement identified in the axillary regions. Previously there were several mediastinal nodes measured. Example right paratracheal which measured 10 x 7 mm on the prior examination, today on series 2, image 23 measures ten by 7 mm. Other small mediastinal nodes are also stable including subcarinal, paratracheal. There also some small hilar nodes. The right-sided node hilum which measured 15 x 15 mm, today is less well defined due to volume averaging but felt to be on image 27 of series 2 at 13 by 10 mm. Small left hilar nodes are also stable. No new lymph node enlargement. Lungs/Pleura: Lungs are without consolidation, pneumothorax or effusion. There are diffuse areas of peripheral interstitial septal thickening, scarring and fibrotic changes greatest along the lower lung zones, similar distribution to the previous examination. Some emphysematous changes along the upper lung zones. The left lung in the lower lobe has a small nodule measuring 3 mm is stable today on series 4, image 92. Similar lingular nodule is also stable today on image 92 of series 4. Right lower lobe has some nodular areas as well which were less well defined. The area in the superior segment of the right lower lobe which is bilobed structure is again  noted. On the prior the dominant component measured 9 x 8 mm and today on series 4, image 59 7 by 5 mm. Smaller the area just caudal and medial to this which measured 2.2 x 0.9 cm, today is less confluence and more semi-solid appearance. On image 65 of series 4 the area measures 19 by 10 mm. Other areas of nodular opacity in the right lower lobe also appear to be slightly improving. There is calcified separate nodule in the middle lobe on image 79 of series 4 consistent with old granulomatous disease. A few other punctate nodules along the inferior middle lobe are also stable. No new large dominant lung lesion. Musculoskeletal: Osteopenia. Scattered degenerative changes. Fixation hardware noted along the lower cervical spine at the edge of the imaging field. Once again there are some sclerotic lesions identified including the manubrium of the sternum. Several sclerotic areas throughout the thoracic spine. Stable compression with Schmorl's node deformity at T12 greater than T10. Extent and distribution appears similar to previous. Also areas of the lumbar spine. CT ABDOMEN PELVIS FINDINGS Hepatobiliary: No true space-occupying liver lesion identified. Stable ectasia of the intrahepatic biliary tree. Previous cholecystectomy. Patent portal vein. Pancreas: Global pancreatic atrophy. No ductal dilatation or obvious mass. Spleen: Normal in size without focal abnormality. Adrenals/Urinary Tract: Adrenal glands are preserved. Minimal thickening of the right adrenal gland is stable. No enhancing renal mass or collecting system dilatation. The ureters have normal course and caliber along the course down to the bladder. Preserved contours of the urinary bladder. Bladder is underdistended. Stomach/Bowel: Moderate colonic stool. Gas is seen in nondilated loops of small and large bowel.  Normal retrocecal appendix. Sigmoid colon diverticula. Mild debris in the stomach. Small bowel is nondilated. Vascular/Lymphatic: Diffuse  vascular calcifications. Normal caliber aorta and IVC. No clear abnormal lymph node enlargement identified in the abdomen and pelvis. Reproductive: Status post hysterectomy. No adnexal masses. Other: Neurostimulator again identified along the posterior aspect of the left hemipelvis. No ascites. Musculoskeletal: Again multiple areas of bony sclerosis along the spine and pelvis consistent with known osseous metastatic disease. Streak artifact related to the patient's left hip arthroplasty. Once again there is some nodular tissue along the margin of the arthroplasty extending into the lateral left hemipelvis lateral to the external iliac vessels. This is unchanged retrospect going back to a study of August 2019. At that time felt to relate to a chronic inflammatory process. Please correlate with the history IMPRESSION: Subtle areas of nodularity in the right lung are similar to decreased today. No new lung mass identified. Small mediastinal and hilar nodes are similar to previous examination. No new lymph node enlargement identified. Diffuse osseous known metastatic disease. Electronically Signed   By: Jill Side M.D.   On: 03/15/2022 15:09   CT Abdomen Pelvis W Contrast  Result Date: 03/15/2022 CLINICAL DATA:  Small-cell lung cancer. Metastatic disease. On chemotherapy. Finished XRT. * Tracking Code: BO * additional remote history of cervical cancer. EXAM: CT CHEST, ABDOMEN, AND PELVIS WITH CONTRAST TECHNIQUE: Multidetector CT imaging of the chest, abdomen and pelvis was performed following the standard protocol during bolus administration of intravenous contrast. RADIATION DOSE REDUCTION: This exam was performed according to the departmental dose-optimization program which includes automated exposure control, adjustment of the mA and/or kV according to patient size and/or use of iterative reconstruction technique. CONTRAST:  112m OMNIPAQUE IOHEXOL 300 MG/ML  SOLN COMPARISON:  CT 02/01/2022 and older FINDINGS:  CT CHEST FINDINGS Cardiovascular: Right upper chest port. Heart is nonenlarged. No significant pericardial effusion. Coronary artery calcifications are seen. The thoracic aorta has an overall normal course and caliber with mild atherosclerotic plaque. Mediastinum/Nodes: Small thyroid gland. Slight wall thickening along the midthoracic esophagus, unchanged from previous and nonspecific. Specific abnormal lymph node enlargement identified in the axillary regions. Previously there were several mediastinal nodes measured. Example right paratracheal which measured 10 x 7 mm on the prior examination, today on series 2, image 23 measures ten by 7 mm. Other small mediastinal nodes are also stable including subcarinal, paratracheal. There also some small hilar nodes. The right-sided node hilum which measured 15 x 15 mm, today is less well defined due to volume averaging but felt to be on image 27 of series 2 at 13 by 10 mm. Small left hilar nodes are also stable. No new lymph node enlargement. Lungs/Pleura: Lungs are without consolidation, pneumothorax or effusion. There are diffuse areas of peripheral interstitial septal thickening, scarring and fibrotic changes greatest along the lower lung zones, similar distribution to the previous examination. Some emphysematous changes along the upper lung zones. The left lung in the lower lobe has a small nodule measuring 3 mm is stable today on series 4, image 92. Similar lingular nodule is also stable today on image 92 of series 4. Right lower lobe has some nodular areas as well which were less well defined. The area in the superior segment of the right lower lobe which is bilobed structure is again noted. On the prior the dominant component measured 9 x 8 mm and today on series 4, image 59 7 by 5 mm. Smaller the area just caudal and medial to this which  measured 2.2 x 0.9 cm, today is less confluence and more semi-solid appearance. On image 65 of series 4 the area measures 19 by 10  mm. Other areas of nodular opacity in the right lower lobe also appear to be slightly improving. There is calcified separate nodule in the middle lobe on image 79 of series 4 consistent with old granulomatous disease. A few other punctate nodules along the inferior middle lobe are also stable. No new large dominant lung lesion. Musculoskeletal: Osteopenia. Scattered degenerative changes. Fixation hardware noted along the lower cervical spine at the edge of the imaging field. Once again there are some sclerotic lesions identified including the manubrium of the sternum. Several sclerotic areas throughout the thoracic spine. Stable compression with Schmorl's node deformity at T12 greater than T10. Extent and distribution appears similar to previous. Also areas of the lumbar spine. CT ABDOMEN PELVIS FINDINGS Hepatobiliary: No true space-occupying liver lesion identified. Stable ectasia of the intrahepatic biliary tree. Previous cholecystectomy. Patent portal vein. Pancreas: Global pancreatic atrophy. No ductal dilatation or obvious mass. Spleen: Normal in size without focal abnormality. Adrenals/Urinary Tract: Adrenal glands are preserved. Minimal thickening of the right adrenal gland is stable. No enhancing renal mass or collecting system dilatation. The ureters have normal course and caliber along the course down to the bladder. Preserved contours of the urinary bladder. Bladder is underdistended. Stomach/Bowel: Moderate colonic stool. Gas is seen in nondilated loops of small and large bowel. Normal retrocecal appendix. Sigmoid colon diverticula. Mild debris in the stomach. Small bowel is nondilated. Vascular/Lymphatic: Diffuse vascular calcifications. Normal caliber aorta and IVC. No clear abnormal lymph node enlargement identified in the abdomen and pelvis. Reproductive: Status post hysterectomy. No adnexal masses. Other: Neurostimulator again identified along the posterior aspect of the left hemipelvis. No ascites.  Musculoskeletal: Again multiple areas of bony sclerosis along the spine and pelvis consistent with known osseous metastatic disease. Streak artifact related to the patient's left hip arthroplasty. Once again there is some nodular tissue along the margin of the arthroplasty extending into the lateral left hemipelvis lateral to the external iliac vessels. This is unchanged retrospect going back to a study of August 2019. At that time felt to relate to a chronic inflammatory process. Please correlate with the history IMPRESSION: Subtle areas of nodularity in the right lung are similar to decreased today. No new lung mass identified. Small mediastinal and hilar nodes are similar to previous examination. No new lymph node enlargement identified. Diffuse osseous known metastatic disease. Electronically Signed   By: Jill Side M.D.   On: 03/15/2022 15:09    ASSESSMENT AND PLAN: This is a very pleasant 72 years old white female with Extensive stage (T4, N2, M1 C) small cell lung cancer presented with large right lower lobe consolidative mass in addition to right hilar and mediastinal lymphadenopathy and extensive bone metastasis diagnosed in November 2023.  The patient is currently undergoing systemic chemotherapy with Palliative systemic chemotherapy with carboplatin for AUC of 5 on day 1, etoposide 100 Mg/M2 on days 1, 2 and 3 with Cosela 240 Mg/M2 on the days of the chemotherapy as well as Imfinzi 1500 mg IV on day 1 every 3 weeks during the induction phase and then every 4 weeks during the maintenance if there is no evidence for disease progression.  Status post 4 cycles.  Starting from cycle #5 she will be on maintenance treatment with single agent Imfinzi 1500 Mg IV every 4 weeks. The patient has been tolerating her treatment fairly well with  no concerning adverse effect except for mild fatigue. She had repeat CT scan of the chest, abdomen and pelvis performed recently.  I personally and independently reviewed  the scan and discussed the result with the patient and her husband. Her scan showed no concerning findings for disease progression and there was some further mild improvement. I recommended for her to continue her treatment with the first cycle of the maintenance therapy with Imfinzi today. I will see the patient back for follow-up visit in 4 weeks for evaluation before the next cycle of her treatment. For the pain management she is currently on oxycodone but it makes her itch.  She is taking Benadryl with it. The patient will continue on the ferrous sulfate for the anemia. She was advised to call immediately if she has any other concerning symptoms in the interval.  The patient voices understanding of current disease status and treatment options and is in agreement with the current care plan.  All questions were answered. The patient knows to call the clinic with any problems, questions or concerns. We can certainly see the patient much sooner if necessary.  The total time spent in the appointment was 30 minutes.  Disclaimer: This note was dictated with voice recognition software. Similar sounding words can inadvertently be transcribed and may not be corrected upon review.

## 2022-03-20 NOTE — Patient Instructions (Signed)
[facility]  Discharge Instructions: Thank you for choosing [facility] to provide your oncology and hematology care.   If you have a lab appointment with the [facility], please go directly to the [facility] and check in at the registration area.   Wear comfortable clothing and clothing appropriate for easy access to any Portacath or PICC line.   We strive to give you quality time with your provider. You may need to reschedule your appointment if you arrive late (15 or more minutes).  Arriving late affects you and other patients whose appointments are after yours.  Also, if you miss three or more appointments without notifying the office, you may be dismissed from the clinic at the provider's discretion.      For prescription refill requests, have your pharmacy contact our office and allow 72 hours for refills to be completed.    Today you received the following chemotherapy and/or immunotherapy agent: Imfinzi   To help prevent nausea and vomiting after your treatment, we encourage you to take your nausea medication as directed.  BELOW ARE SYMPTOMS THAT SHOULD BE REPORTED IMMEDIATELY: *FEVER GREATER THAN 100.4 F (38 C) OR HIGHER *CHILLS OR SWEATING *NAUSEA AND VOMITING THAT IS NOT CONTROLLED WITH YOUR NAUSEA MEDICATION *UNUSUAL SHORTNESS OF BREATH *UNUSUAL BRUISING OR BLEEDING *URINARY PROBLEMS (pain or burning when urinating, or frequent urination) *BOWEL PROBLEMS (unusual diarrhea, constipation, pain near the anus) TENDERNESS IN MOUTH AND THROAT WITH OR WITHOUT PRESENCE OF ULCERS (sore throat, sores in mouth, or a toothache) UNUSUAL RASH, SWELLING OR PAIN  UNUSUAL VAGINAL DISCHARGE OR ITCHING   Items with * indicate a potential emergency and should be followed up as soon as possible or go to the Emergency Department if any problems should occur.  Please show the CHEMOTHERAPY ALERT CARD or IMMUNOTHERAPY ALERT CARD at check-in to  the Emergency Department and triage nurse.  Should you have questions after your visit or need to cancel or reschedule your appointment, please contact [facility]  Dept: [phone]  and follow the prompts.  Office hours are 8:00 a.m. to 4:30 p.m. Monday - Friday. Please note that voicemails left after 4:00 p.m. may not be returned until the following business day.  We are closed weekends and major holidays. You have access to a nurse at all times for urgent questions. Please call the main number to the clinic Dept: [phone] and follow the prompts.   For any non-urgent questions, you may also contact your provider using MyChart. We now offer e-Visits for anyone 57 and older to request care online for non-urgent symptoms. For details visit [email or web address].   Also download the MyChart app! Go to the app store, search "MyChart", open the app, select [facility], and log in with your MyChart username and password.

## 2022-03-28 ENCOUNTER — Other Ambulatory Visit: Payer: Self-pay

## 2022-03-28 ENCOUNTER — Other Ambulatory Visit (HOSPITAL_COMMUNITY): Payer: Self-pay

## 2022-03-28 MED ORDER — METHOCARBAMOL 500 MG PO TABS
500.0000 mg | ORAL_TABLET | Freq: Four times a day (QID) | ORAL | 1 refills | Status: DC | PRN
  Filled 2022-03-28: qty 120, 30d supply, fill #0

## 2022-03-28 MED ORDER — TRAZODONE HCL 50 MG PO TABS
50.0000 mg | ORAL_TABLET | Freq: Every evening | ORAL | 2 refills | Status: DC
  Filled 2022-03-28 (×2): qty 90, 90d supply, fill #0

## 2022-03-28 MED ORDER — DULOXETINE HCL 60 MG PO CPEP
120.0000 mg | ORAL_CAPSULE | Freq: Every day | ORAL | 3 refills | Status: DC
  Filled 2022-03-28: qty 60, 30d supply, fill #0

## 2022-03-28 MED ORDER — ASPIRIN 81 MG PO TBEC
81.0000 mg | DELAYED_RELEASE_TABLET | Freq: Every day | ORAL | 3 refills | Status: DC
  Filled 2022-03-28: qty 90, 90d supply, fill #0

## 2022-03-28 MED ORDER — OXYCODONE HCL 10 MG PO TABS
10.0000 mg | ORAL_TABLET | ORAL | 0 refills | Status: DC | PRN
  Filled 2022-03-28: qty 180, 30d supply, fill #0

## 2022-03-28 MED ORDER — ROPINIROLE HCL 1 MG PO TABS
1.0000 mg | ORAL_TABLET | Freq: Three times a day (TID) | ORAL | 1 refills | Status: DC
  Filled 2022-03-28: qty 137, 46d supply, fill #0
  Filled 2022-03-28: qty 133, 44d supply, fill #0

## 2022-03-29 ENCOUNTER — Telehealth: Payer: Self-pay | Admitting: Internal Medicine

## 2022-03-29 NOTE — Telephone Encounter (Signed)
Called patient regarding upcoming April/May appointments, left a voicemail.  

## 2022-04-15 ENCOUNTER — Other Ambulatory Visit: Payer: Self-pay | Admitting: Internal Medicine

## 2022-04-16 ENCOUNTER — Other Ambulatory Visit: Payer: Self-pay

## 2022-04-17 ENCOUNTER — Telehealth: Payer: Self-pay | Admitting: Internal Medicine

## 2022-04-17 ENCOUNTER — Inpatient Hospital Stay: Payer: Medicare Other

## 2022-04-17 ENCOUNTER — Inpatient Hospital Stay: Payer: Medicare Other | Admitting: Internal Medicine

## 2022-04-17 ENCOUNTER — Other Ambulatory Visit: Payer: Medicare Other

## 2022-04-17 NOTE — Telephone Encounter (Signed)
Patient called scheduling line stating she and spouse were ill and not going to be able to make 4/3 appointments. Forwarded information to RN & MD.

## 2022-04-18 ENCOUNTER — Other Ambulatory Visit: Payer: Self-pay

## 2022-04-21 ENCOUNTER — Other Ambulatory Visit: Payer: Self-pay

## 2022-04-21 ENCOUNTER — Emergency Department (HOSPITAL_COMMUNITY): Payer: Medicare Other

## 2022-04-21 ENCOUNTER — Emergency Department (HOSPITAL_COMMUNITY)
Admission: EM | Admit: 2022-04-21 | Discharge: 2022-04-21 | Disposition: A | Discharge status: Home / Self Care | Payer: Medicare Other | Attending: Emergency Medicine | Admitting: Emergency Medicine

## 2022-04-21 DIAGNOSIS — D649 Anemia, unspecified: Secondary | ICD-10-CM | POA: Insufficient documentation

## 2022-04-21 DIAGNOSIS — J449 Chronic obstructive pulmonary disease, unspecified: Secondary | ICD-10-CM | POA: Diagnosis not present

## 2022-04-21 DIAGNOSIS — Z7982 Long term (current) use of aspirin: Secondary | ICD-10-CM | POA: Diagnosis not present

## 2022-04-21 DIAGNOSIS — E871 Hypo-osmolality and hyponatremia: Secondary | ICD-10-CM | POA: Diagnosis not present

## 2022-04-21 DIAGNOSIS — Z1152 Encounter for screening for COVID-19: Secondary | ICD-10-CM | POA: Diagnosis not present

## 2022-04-21 DIAGNOSIS — J029 Acute pharyngitis, unspecified: Secondary | ICD-10-CM | POA: Diagnosis present

## 2022-04-21 DIAGNOSIS — Z85118 Personal history of other malignant neoplasm of bronchus and lung: Secondary | ICD-10-CM | POA: Diagnosis not present

## 2022-04-21 DIAGNOSIS — J020 Streptococcal pharyngitis: Secondary | ICD-10-CM | POA: Diagnosis not present

## 2022-04-21 DIAGNOSIS — R509 Fever, unspecified: Secondary | ICD-10-CM | POA: Diagnosis not present

## 2022-04-21 LAB — CBC WITH DIFFERENTIAL/PLATELET
Abs Immature Granulocytes: 0.03 10*3/uL (ref 0.00–0.07)
Basophils Absolute: 0 10*3/uL (ref 0.0–0.1)
Basophils Relative: 0 %
Eosinophils Absolute: 0.1 10*3/uL (ref 0.0–0.5)
Eosinophils Relative: 2 %
HCT: 29.6 % — ABNORMAL LOW (ref 36.0–46.0)
Hemoglobin: 9.7 g/dL — ABNORMAL LOW (ref 12.0–15.0)
Immature Granulocytes: 0 %
Lymphocytes Relative: 6 %
Lymphs Abs: 0.4 10*3/uL — ABNORMAL LOW (ref 0.7–4.0)
MCH: 32.1 pg (ref 26.0–34.0)
MCHC: 32.8 g/dL (ref 30.0–36.0)
MCV: 98 fL (ref 80.0–100.0)
Monocytes Absolute: 0.5 10*3/uL (ref 0.1–1.0)
Monocytes Relative: 7 %
Neutro Abs: 5.6 10*3/uL (ref 1.7–7.7)
Neutrophils Relative %: 85 %
Platelets: 130 10*3/uL — ABNORMAL LOW (ref 150–400)
RBC: 3.02 MIL/uL — ABNORMAL LOW (ref 3.87–5.11)
RDW: 18.4 % — ABNORMAL HIGH (ref 11.5–15.5)
WBC: 6.7 10*3/uL (ref 4.0–10.5)
nRBC: 0 % (ref 0.0–0.2)

## 2022-04-21 LAB — GROUP A STREP BY PCR: Group A Strep by PCR: DETECTED — AB

## 2022-04-21 LAB — BASIC METABOLIC PANEL
Anion gap: 9 (ref 5–15)
BUN: 9 mg/dL (ref 8–23)
CO2: 27 mmol/L (ref 22–32)
Calcium: 8.6 mg/dL — ABNORMAL LOW (ref 8.9–10.3)
Chloride: 96 mmol/L — ABNORMAL LOW (ref 98–111)
Creatinine, Ser: 0.73 mg/dL (ref 0.44–1.00)
GFR, Estimated: >60 mL/min (ref >60)
Glucose, Bld: 115 mg/dL — ABNORMAL HIGH (ref 70–99)
Potassium: 3.6 mmol/L (ref 3.5–5.1)
Sodium: 132 mmol/L — ABNORMAL LOW (ref 135–145)

## 2022-04-21 LAB — RESP PANEL BY RT-PCR (RSV, FLU A&B, COVID)  RVPGX2
Influenza A by PCR: NEGATIVE
Influenza B by PCR: NEGATIVE
Resp Syncytial Virus by PCR: NEGATIVE
SARS Coronavirus 2 by RT PCR: NEGATIVE

## 2022-04-21 MED ORDER — ACETAMINOPHEN 325 MG PO TABS
650.0000 mg | ORAL_TABLET | Freq: Once | ORAL | Status: AC
  Administered 2022-04-21: 650 mg via ORAL
  Filled 2022-04-21: qty 2

## 2022-04-21 MED ORDER — SODIUM CHLORIDE 0.9 % IV BOLUS
500.0000 mL | Freq: Once | INTRAVENOUS | Status: AC
  Administered 2022-04-21: 500 mL via INTRAVENOUS

## 2022-04-21 MED ORDER — PENICILLIN G BENZATHINE 1200000 UNIT/2ML IM SUSY
1.2000 10*6.[IU] | PREFILLED_SYRINGE | Freq: Once | INTRAMUSCULAR | Status: AC
  Administered 2022-04-21: 1.2 10*6.[IU] via INTRAMUSCULAR
  Filled 2022-04-21: qty 2

## 2022-04-21 NOTE — Discharge Instructions (Addendum)
Your workup today showed that you had strep throat, the antibiotics that we gave you will treat for strep throat, it may take 2 to 3 days until your sore throat symptoms resolved.  You had a mildly low sodium in the emergency department, we gave you some sodium fluid which should help to correct this value but I recommend that you follow-up with your primary care doctor to ensure that your sodium is continuing to remain in normal level over the next 1 to 2 weeks.  Please follow-up with your oncologist and return to your chemotherapy when you are feeling well.

## 2022-04-21 NOTE — ED Triage Notes (Addendum)
Pt arrived via POV. C/o sore throat for 2x days. Fever of 151f this AM. Last chemo tx 1x month ago. Daughter recently dx w/strep.   AOx4

## 2022-04-21 NOTE — ED Provider Notes (Signed)
[facility] EMERGENCY DEPARTMENT AT [facility] Provider Note   CSN: [id number] Arrival date & time: 04/21/22  1818     History  Chief Complaint  Patient presents with   Sore Throat    [name] is a 72 y.o. female past medical history significant for small cell lung cancer who is currently on chemotherapy, last chemotherapy 1 month ago, DDD, morbid obesity, COPD presents with concern for sore throat for 2 days, fever of 101 this morning.  She denies any worsening shortness of breath, reports that she is coughed up a small amount of phlegm.   Sore Throat       Home Medications Prior to Admission medications   Medication Sig Start Date End Date Taking? Authorizing Provider  acetaminophen (TYLENOL) 325 MG tablet Take 650 mg by mouth 2 (two) times daily as needed for headache.    [provider]  albuterol (PROVENTIL) (2.5 MG/3ML) 0.083% nebulizer solution Inhale 3 mLs into the lungs every 4 (four) hours as needed for wheezing or shortness of breath (cough). Patient taking differently: Inhale 3 mLs into the lungs 2 (two) times daily as needed for wheezing or shortness of breath (cough). 09/01/21   Sabino Dick, DO  albuterol (VENTOLIN HFA) 108 (90 Base) MCG/ACT inhaler Inhale 2 puffs into the lungs 2 (two) times daily as needed for wheezing or shortness of breath.    [provider]  aspirin EC (ASPIRIN 81) 81 MG tablet Take 1 tablet (81 mg total) by mouth daily. 03/28/22     aspirin EC 81 MG tablet Take 1 tablet (81 mg total) by mouth daily. Swallow whole. 10/03/21 10/03/22  Alfredo Martinez, MD  benzonatate (TESSALON) 100 MG capsule Take 1 capsule (100 mg total) by mouth 2 (two) times daily as needed for cough. 10/03/21   Alfredo Martinez, MD  busPIRone (BUSPAR) 5 MG tablet Take 1 tablet (5 mg total) by mouth 2 (two) times daily. 02/18/18   Jomarie Longs, MD  Cholecalciferol (VITAMIN D-3 PO) Take 2,000 Units by mouth daily.    [provider]  cyanocobalamin (VITAMIN B12) 1000 MCG/ML injection Inject 1,000 mcg into the muscle every 30 (thirty) days. 09/11/21   [provider]  docusate sodium (COLACE) 100 MG capsule Take 200 mg by mouth daily as needed (constipation).    [provider]  DULoxetine (CYMBALTA) 60 MG capsule Take 60 mg by mouth at bedtime.    [provider]  DULoxetine (CYMBALTA) 60 MG capsule Take 2 capsules (120 mg total) by mouth daily. 03/28/22     ferrous sulfate 324 MG TBEC Take 324 mg by mouth. Patient not taking: Reported on 02/06/2022    [provider]  guaiFENesin-codeine 100-10 MG/5ML syrup Take 10 mLs by mouth 3 (three) times daily as needed. 12/11/21   [provider]  hydrocortisone (ANUSOL-HC) 25 MG suppository Place 1 suppository (25 mg total) rectally 2 (two) times daily as needed for hemorrhoids or anal itching. 09/01/21   Sabino Dick, DO  lidocaine-prilocaine (EMLA) cream Apply 30 minutes before appointment 02/27/22   Heilingoetter, Cassandra L, PA-C  linaclotide (LINZESS) 290 MCG CAPS capsule Take 1 capsule (290 mcg total) by mouth daily before breakfast. Patient taking differently: Take 290 mcg by mouth daily as needed (Constipation). 09/01/21   Sabino Dick, DO  losartan (COZAAR) 25 MG tablet Take 25 mg by mouth daily. 10/08/21   [provider]  methocarbamol (ROBAXIN) 500 MG tablet Take 1 tablet (500 mg total) by mouth  every 6 (six) hours as needed for muscle spasms. 10/13/17   Allena Katz, PA-C  methocarbamol (ROBAXIN) 500 MG tablet Take 1 tablet (500 mg total) by mouth every 6 (six) hours as needed. 03/28/22     metoCLOPramide (REGLAN) 5 MG tablet Take 1 tablet by mouth 3 times daily 20-30 minutes before meals NO FURTHER REFILLS UNTIL SEEN, NEEDS AND APPOINTMENT Patient taking differently: Take 5 mg by mouth 3 (three) times daily before meals. 02/19/21   Hilarie Fredrickson, MD  nitroGLYCERIN (NITROSTAT) 0.4 MG SL tablet Place 0.4 mg  under the tongue every 5 (five) minutes x 3 doses as needed for chest pain. 05/17/16   [provider]  Oxycodone HCl 10 MG TABS Take 10 mg by mouth 5 (five) times daily as needed (for moderate pain). Patient not taking: Reported on 02/06/2022 09/13/21   [provider]  Oxycodone HCl 10 MG TABS Take 1 tablet (10 mg total) by mouth 5 (five) times daily as needed for pain. Patient not taking: Reported on 02/06/2022 01/17/22     Oxycodone HCl 10 MG TABS Take 1 tablet (10 mg total) by mouth every 4 (four) hours as needed for pain. 01/31/22     Oxycodone HCl 10 MG TABS Take 1 tablet by mouth every 4 hours as needed for pain 02/28/22     Oxycodone HCl 10 MG TABS Take 1 tablet (10 mg total) by mouth every 4 (four) hours as needed for pain 03/28/22     pantoprazole (PROTONIX) 40 MG tablet Take 40 mg by mouth daily as needed (acid reflux).    [provider]  potassium chloride SA (KLOR-CON M) 20 MEQ tablet Take 1 tablet (20 mEq total) by mouth 2 (two) times daily. 03/13/22   Heilingoetter, Cassandra L, PA-C  prochlorperazine (COMPAZINE) 10 MG tablet Take 1 tablet (10 mg total) by mouth every 6 (six) hours as needed for nausea or vomiting. 12/20/21   Si Gaul, MD  rOPINIRole (REQUIP) 1 MG tablet Take 1 mg by mouth 2 (two) times daily.    [provider]  rOPINIRole (REQUIP) 1 MG tablet Take 1 tablet (1 mg total) by mouth 3 (three) times daily as directed 03/28/22     rosuvastatin (CRESTOR) 10 MG tablet Take 10 mg by mouth at bedtime. 06/10/20   [provider]  senna (SENOKOT) 8.6 MG tablet Take 17.2 mg by mouth daily as needed for constipation.    [provider]  Tiotropium Bromide-Olodaterol (STIOLTO RESPIMAT) 2.5-2.5 MCG/ACT AERS Inhale 2 puffs into the lungs daily. Patient not taking: Reported on 02/06/2022 11/09/21   Parrett, Virgel Bouquet, NP  Tiotropium Bromide-Olodaterol (STIOLTO RESPIMAT) 2.5-2.5 MCG/ACT AERS Inhale 2 puffs into the lungs daily. Patient  not taking: Reported on 02/06/2022 11/09/21   Parrett, Virgel Bouquet, NP  traZODone (DESYREL) 50 MG tablet TAKE 0.5-1 TABLETS (25-50 MG TOTAL) BY MOUTH AT BEDTIME AS NEEDED FOR SLEEP. Patient taking differently: Take 50 mg by mouth at bedtime. 03/16/18   Jomarie Longs, MD  traZODone (DESYREL) 50 MG tablet Take 1 tablet (50 mg total) by mouth at bedtime. 03/28/22         Allergies    Bee venom, Morphine and related, Neurontin [gabapentin], Codeine, Adhesive [tape], Sulfa antibiotics, and Sulfonamide derivatives    Review of Systems   Review of Systems  HENT:  Positive for sore throat.   All other systems reviewed and are negative.   Physical Exam Updated Vital Signs BP (!) 142/73   Pulse Marland Kitchen)  102   Temp 99.1 F (37.3 C) (Oral)   Resp (!) 22   Ht 5\' 3"  (1.6 m)   Wt 83 kg   SpO2 96%   BMI 32.42 kg/m  Physical Exam Vitals and nursing note reviewed.  Constitutional:      General: She is not in acute distress.    Appearance: Normal appearance.  HENT:     Head: Normocephalic and atraumatic.     Mouth/Throat:     Comments: mild posterior oropharynx erythema, no swelling, exudate. Uvula midline, tonsils 1+ bilaterally.  No trismus, stridor, evidence of PTA, floor of mouth swelling or redness.   Eyes:     General:        Right eye: No discharge.        Left eye: No discharge.  Cardiovascular:     Rate and Rhythm: Regular rhythm. Tachycardia present.     Heart sounds: No murmur heard.    No friction rub. No gallop.  Pulmonary:     Effort: Pulmonary effort is normal.     Breath sounds: Normal breath sounds.     Comments: Mild tachypnea intermittently with no wheezing, rhonchi, rales Abdominal:     General: Bowel sounds are normal.     Palpations: Abdomen is soft.  Skin:    General: Skin is warm and dry.     Capillary Refill: Capillary refill takes less than 2 seconds.  Neurological:     Mental Status: She is alert and oriented to person, place, and time.  Psychiatric:        Mood  and Affect: Mood normal.        Behavior: Behavior normal.     ED Results / Procedures / Treatments   Labs (all labs ordered are listed, but only abnormal results are displayed) Labs Reviewed  GROUP A STREP BY PCR - Abnormal; Notable for the following components:      Result Value   Group A Strep by PCR DETECTED (*)    All other components within normal limits  CBC WITH DIFFERENTIAL/PLATELET - Abnormal; Notable for the following components:   RBC 3.02 (*)    Hemoglobin 9.7 (*)    HCT 29.6 (*)    RDW 18.4 (*)    Platelets 130 (*)    Lymphs Abs 0.4 (*)    All other components within normal limits  BASIC METABOLIC PANEL - Abnormal; Notable for the following components:   Sodium 132 (*)    Chloride 96 (*)    Glucose, Bld 115 (*)    Calcium 8.6 (*)    All other components within normal limits  RESP PANEL BY RT-PCR (RSV, FLU A&B, COVID)  [id number]    EKG None  Radiology DG Chest 2 View  Result Date: 04/21/2022 CLINICAL DATA:  Fever, cough and confusion. EXAM: CHEST - 2 VIEW COMPARISON:  February 08, 2022 FINDINGS: There is stable right-sided venous Port-A-Cath positioning. The heart size and mediastinal contours are within normal limits. Low lung volumes are noted. Very mild atelectatic changes are seen within bilateral lung bases. There is no evidence of an acute infiltrate, pleural effusion or pneumothorax. Radiopaque surgical clips are seen within the right upper quadrant. A radiopaque fusion plate and screws are seen overlying the lower cervical spine. The visualized skeletal structures are unremarkable. IMPRESSION: Low lung volumes without evidence of acute or active cardiopulmonary disease. Electronically Signed   By: Aram Candela M.D.   On: 04/21/2022 19:42    Procedures Procedures  Medications Ordered in ED Medications  acetaminophen (TYLENOL) tablet 650 mg (650 mg Oral Given 04/21/22 1914)  sodium chloride 0.9 % bolus 500 mL (0 mLs Intravenous Stopped 04/21/22 2030)   penicillin g benzathine (BICILLIN LA) 1200000 UNIT/2ML injection 1.2 Million Units (1.2 Million Units Intramuscular Given 04/21/22 2028)    ED Course/ Medical Decision Making/ A&P                             Medical Decision Making Amount and/or Complexity of Data Reviewed Labs: ordered. Radiology: ordered.   This patient is a 72 y.o. female  who presents to the ED for concern of sore throat, cough, neck pain.   Differential diagnoses prior to evaluation: The emergent differential diagnosis includes, but is not limited to,  URI, epiglottitis, tonsillitis, as daughter has strep, I have a high suspicion for strep pharyngitis, considered a broad differential of other conditions in immunocompromised cancer patient including atypical presentation for PE, pneumonia, or other intrathoracic abnormality vs neutropenic fever, electrolyte abnormality or other . This is not an exhaustive differential.   Past Medical History / Co-morbidities: small cell lung cancer who is currently on chemotherapy, last chemotherapy 1 month ago, DDD, morbid obesity, COPD  Additional history: Chart reviewed. Pertinent results include: Reviewed outpatient oncology visits, including recent lab work and outpatient adding, she has not recently required hospital admission  Physical Exam: Physical exam performed. The pertinent findings include: Patient with mild erythema of posterior oropharynx but tonsils are not significantly enlarged, there is no exudate of the tonsils or posterior oropharynx.  She has subacute temperature of 99.1, she is mildly tachycardic and tachypneic on arrival, pulse rate 103, respirations 27 on arrival improved to 22 on repeat.  I am suspicious that this is secondary to her chronic lung disease as she has no wheezing, rhonchi, stridor, or focal consolidation on my auscultation.  Lab Tests/Imaging studies: I personally interpreted labs/imaging and the pertinent results include: CBC notable for  anemia, hemoglobin 9.7, not significantly changed from her baseline.  Her BMP is notable for mild hyponatremia, sodium 132, fluids administered for hyponatremia and mild tachycardia her RVP is negative for COVID, flu, RSV, but PCR strep is positive..  I independently interpreted plain film chest x-ray which shows low lung volumes but without any acute intrathoracic abnormality.  I agree with the radiologist interpretation.   Medications: I ordered medication including Tylenol, fluid bolus for pain, mildly elevated temperature, tachycardia, hyponatremia, penicillin for strep pharyngitis.  I have reviewed the patients home medicines and have made adjustments as needed.   Disposition: After consideration of the diagnostic results and the patients response to treatment, I feel that patient symptoms are consistent with strep pharyngitis, she has no evidence of significant respiratory compromise, she is breathing easily, she does have mild tachycardia mildly elevated fever which is likely secondary to her strep infection.  Encourage close PCP follow-up and return precautions if symptoms worsen despite treatment.Marland Kitchen   emergency department workup does not suggest an emergent condition requiring admission or immediate intervention beyond what has been performed at this time. The plan is: as above. The patient is safe for discharge and has been instructed to return immediately for worsening symptoms, change in symptoms or any other concerns.  Final Clinical Impression(s) / ED Diagnoses Final diagnoses:  Strep pharyngitis  Hyponatremia    Rx / DC Orders ED Discharge Orders     None  [place] 04/21/22 2116    Linwood Dibbles, MD 04/23/22 [phone]

## 2022-04-24 ENCOUNTER — Other Ambulatory Visit: Payer: Self-pay | Admitting: Physician Assistant

## 2022-04-24 ENCOUNTER — Telehealth: Payer: Self-pay | Admitting: Internal Medicine

## 2022-04-24 NOTE — Telephone Encounter (Signed)
Rescheduled April appointments, per patient's request. Patient is notified of new appointments times and date.

## 2022-04-26 ENCOUNTER — Telehealth: Payer: Self-pay | Admitting: *Deleted

## 2022-04-26 ENCOUNTER — Other Ambulatory Visit: Payer: Self-pay

## 2022-04-26 NOTE — Telephone Encounter (Signed)
Contacted patient in response to VM - she's had generalized chest discomfort and pain since this morning, but thinks it may have improved a little now. States worse with breathing. Productive cough w/yellow sputum. Denies fever. Denies dyspnea. She states she feels like she is going to drop when she is up walking about. Patient recently had strep pharyngitis Encouraged patient to go to Urgent Care/ED for evaluation this afternoon. Patient verbalized understanding and states she will go.

## 2022-04-27 ENCOUNTER — Other Ambulatory Visit: Payer: Self-pay | Admitting: Family Medicine

## 2022-04-28 NOTE — Progress Notes (Unsigned)
[facility] OFFICE PROGRESS NOTE  [name], NP [address] [place] [address]  DIAGNOSIS: Extensive stage (T4, N2, M1 C) small cell lung cancer presented with large right lower lobe consolidative mass in addition to right hilar and mediastinal lymphadenopathy and extensive bone metastasis diagnosed in November 2023.   PRIOR THERAPY: None  CURRENT THERAPY: Palliative systemic chemotherapy with carboplatin for AUC of 5 on day 1, etoposide 100 Mg/M2 on days 1, 2 and 3 with Cosela 240 Mg/M2 on the days of the chemotherapy as well as Imfinzi 1500 mg IV on day 1 every 3 weeks during the induction phase and then every 4 weeks during the maintenance if there is no evidence for disease progression.  Status post 5 cycles.  Starting from cycle #5 she started on maintenance treatment with Imfinzi 1500 Mg IV every 4 weeks.   INTERVAL HISTORY: CALIEGH MCCURRY 72 y.o. female returns to the clinic today for a follow-up visit. The patient was last seen in the clinic on 03/20/22. She was supposed to follow up on 04/17/22 but was feeling unwell. She presented to an urgent care on 04/21/22 and was diagnosed with strep pharyngitis which she got from her daughter. She is feeling better at this time.    Regarding her treatment, last month she started maintenance immunotherapy with imfinzi, which she tolerated this well.   Since last being seen she denies any fever or chills.  She called last week reporting a episode of chest discomfort.  She was advised to go to the emergency room.  The patient states her symptoms resolved and she did not get evaluated.  She denies any more chest pain at this time.  She does have a cardiologist.  She denies any changes in her baseline dyspnea on exertion and chronic occasional cough.  She denies any hemoptysis.  She has intermittent nausea and reflux for which she needs a refill of her Compazine and Protonix.  She states she is scheduled to see her PCP later this week.   Denies any vomiting.  She has some chronic pain in her back and neuropathy in her feet which has been going on for several years.  She is followed by Dr. Vear Clock at the pain clinic.  The patient is currently on Cymbalta.  She is also on oxycodone 10 mg every 4 hours.  The patient is curious what the signs and symptoms of overdose are as she is fearful of taking pain medication.  She has been trying to write down when she takes her medication to avoid taking it at shorter intervals than what is recommended.  She denies any rashes or skin changes.  Denies any headache or visual changes.  She is here today for evaluation repeat blood work before undergoing cycle #6.     MEDICAL HISTORY: Past Medical History:  Diagnosis Date   Anxiety    Aortic atherosclerosis (HCC)    Bilateral cataracts    Carpal tunnel syndrome    Cervical cancer (HCC)    Chronic back pain    Claudication (HCC)    COPD (chronic obstructive pulmonary disease) (HCC)    wheezing   DDD (degenerative disc disease), cervical    DDD (degenerative disc disease), lumbar    Depression    Diverticulosis    Dyspnea    on exertion   Gastroparesis    GERD (gastroesophageal reflux disease)    Hearing loss    Bilateral   History of blood transfusion  History of colon polyps    History of migraine    History of radiation therapy    Left scapula,left chest, right pelvis- 12/20/21-01/02/22- Dr. 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  MEDICATIONS:  Current Outpatient Medications  Medication Sig Dispense Refill   acetaminophen (TYLENOL) 325 MG tablet Take 650 mg by mouth 2 (two) times daily as needed for headache.     albuterol (PROVENTIL) (2.5 MG/3ML) 0.083% nebulizer solution Inhale 3 mLs into the lungs every 4 (four) hours as needed for wheezing or shortness of breath (cough). (Patient taking differently: Inhale 3 mLs into the lungs 2 (two) times daily as needed for wheezing or shortness of breath (cough).) 75 mL 12   albuterol (VENTOLIN HFA) 108 (90 Base) MCG/ACT inhaler Inhale 2 puffs into the lungs 2 (two) times daily as needed for wheezing or shortness of breath.     aspirin EC (ASPIRIN 81) 81 MG tablet Take 1 tablet (81 mg total) by mouth daily. 90 tablet 3   aspirin EC 81 MG tablet Take 1 tablet (81 mg total) by mouth daily. Swallow whole. 150 tablet 2   benzonatate (TESSALON) 100 MG capsule Take 1 capsule (100 mg total) by mouth 2 (two) times daily as needed for cough. 20 capsule 0   busPIRone (BUSPAR) 5 MG tablet Take 1 tablet (5 mg total) by mouth 2 (two) times daily. 60 tablet 0   Cholecalciferol (VITAMIN D-3 PO) Take 2,000 Units by mouth daily.     cyanocobalamin (VITAMIN B12) 1000 MCG/ML injection Inject 1,000 mcg into the muscle every 30 (thirty) days.     docusate sodium (COLACE) 100 MG capsule Take 200 mg by mouth daily as needed (constipation).     DULoxetine (CYMBALTA) 60 MG capsule Take 60 mg by mouth at bedtime.     DULoxetine (CYMBALTA) 60 MG capsule Take 2 capsules (120 mg total) by mouth daily. 60 capsule 3   ferrous sulfate 324 MG TBEC Take 324 mg by mouth. (Patient not taking: Reported on 02/06/2022)     guaiFENesin-codeine 100-10 MG/5ML syrup Take 10 mLs by  mouth 3 (three) times daily as needed.     hydrocortisone (ANUSOL-HC) 25 MG suppository Place 1 suppository (25 mg total) rectally 2 (two) times daily as needed for hemorrhoids or anal itching. 12 suppository 0   lidocaine-prilocaine (EMLA) cream Apply 30 minutes before appointment 30 g 2   linaclotide (LINZESS) 290 MCG CAPS capsule Take 1 capsule (290 mcg total) by mouth daily before breakfast. (Patient taking differently: Take 290 mcg  by mouth daily as needed (Constipation).) 30 capsule 1   losartan (COZAAR) 25 MG tablet Take 25 mg by mouth daily.     methocarbamol (ROBAXIN) 500 MG tablet Take 1 tablet (500 mg total) by mouth every 6 (six) hours as needed for muscle spasms. 60 tablet 0   methocarbamol (ROBAXIN) 500 MG tablet Take 1 tablet (500 mg total) by mouth every 6 (six) hours as needed. 120 tablet 1   metoCLOPramide (REGLAN) 5 MG tablet Take 1 tablet by mouth 3 times daily 20-30 minutes before meals NO FURTHER REFILLS UNTIL SEEN, NEEDS AND APPOINTMENT (Patient taking differently: Take 5 mg by mouth 3 (three) times daily before meals.) 90 tablet 0   nitroGLYCERIN (NITROSTAT) 0.4 MG SL tablet Place 0.4 mg under the tongue every 5 (five) minutes x 3 doses as needed for chest pain.  1   Oxycodone HCl 10 MG TABS Take 10 mg by mouth 5 (five) times daily as needed (for moderate pain). (Patient not taking: Reported on 02/06/2022)     Oxycodone HCl 10 MG TABS Take 1 tablet (10 mg total) by mouth 5 (five) times daily as needed for pain. (Patient not taking: Reported on 02/06/2022) 150 tablet 0   Oxycodone HCl 10 MG TABS Take 1 tablet (10 mg total) by mouth every 4 (four) hours as needed for pain. 180 tablet 0   Oxycodone HCl 10 MG TABS Take 1 tablet by mouth every 4 hours as needed for pain 180 tablet 0   Oxycodone HCl 10 MG TABS Take 1 tablet (10 mg total) by mouth every 4 (four) hours as needed for pain 180 tablet 0   Oxycodone HCl 10 MG TABS Take 1 tablet (10 mg total) by mouth every 4 (four) hours as  needed for pain. 180 tablet 0   pantoprazole (PROTONIX) 40 MG tablet Take 1 tablet (40 mg total) by mouth daily as needed (acid reflux). 30 tablet 2   potassium chloride SA (KLOR-CON M) 20 MEQ tablet Take 1 tablet (20 mEq total) by mouth 2 (two) times daily. 14 tablet 0   prochlorperazine (COMPAZINE) 10 MG tablet Take 1 tablet (10 mg total) by mouth every 6 (six) hours as needed for nausea or vomiting. 30 tablet 2   rOPINIRole (REQUIP) 1 MG tablet Take 1 mg by mouth 2 (two) times daily.     rOPINIRole (REQUIP) 1 MG tablet Take 1 tablet (1 mg total) by mouth 3 (three) times daily as directed 270 tablet 1   rosuvastatin (CRESTOR) 10 MG tablet Take 10 mg by mouth at bedtime.     senna (SENOKOT) 8.6 MG tablet Take 17.2 mg by mouth daily as needed for constipation.     Tiotropium Bromide-Olodaterol (STIOLTO RESPIMAT) 2.5-2.5 MCG/ACT AERS Inhale 2 puffs into the lungs daily. (Patient not taking: Reported on 02/06/2022) 1 each 5   Tiotropium Bromide-Olodaterol (STIOLTO RESPIMAT) 2.5-2.5 MCG/ACT AERS Inhale 2 puffs into the lungs daily. (Patient not taking: Reported on 02/06/2022) 4 g 0   traZODone (DESYREL) 50 MG tablet TAKE 0.5-1 TABLETS (25-50 MG TOTAL) BY MOUTH AT BEDTIME AS NEEDED FOR SLEEP. (Patient taking differently: Take 50 mg by mouth at bedtime.) 90 tablet 1   traZODone (DESYREL) 50 MG tablet Take 1 tablet (50 mg total) by mouth at bedtime. 90 tablet 2   No current facility-administered medications for this visit.   Facility-Administered Medications Ordered in Other Visits  Medication Dose Route Frequency Provider Last Rate Last Admin   0.9 %  sodium chloride  infusion   Intravenous Once Si Gaul, MD       durvalumab [facility]) 1,500 mg in sodium chloride 0.9 % 100 mL chemo infusion  1,500 mg Intravenous Once Si Gaul, MD        SURGICAL HISTORY:  Past Surgical History:  Procedure Laterality Date   BACK SURGERY     BIOPSY  08/31/2021   Procedure: BIOPSY;  Surgeon: Tressia Danas, MD;  Location: [facility] ENDOSCOPY;  Service: Gastroenterology;;   BLEPHAROPLASTY  10/10/2017   BRONCHIAL BIOPSY  11/21/2021   Procedure: BRONCHIAL BIOPSIES;  Surgeon: Oretha Milch, MD;  Location: [facility] ENDOSCOPY;  Service: Cardiopulmonary;;   BRONCHIAL BRUSHINGS  11/21/2021   Procedure: BRONCHIAL BRUSHINGS;  Surgeon: Oretha Milch, MD;  Location: [facility] ENDOSCOPY;  Service: Cardiopulmonary;;   BRONCHIAL NEEDLE ASPIRATION BIOPSY  11/21/2021   Procedure: BRONCHIAL NEEDLE ASPIRATION BIOPSIES;  Surgeon: Oretha Milch, MD;  Location: [facility] ENDOSCOPY;  Service: Cardiopulmonary;;   BRONCHIAL WASHINGS  11/21/2021   Procedure: BRONCHIAL WASHINGS;  Surgeon: Oretha Milch, MD;  Location: [facility] ENDOSCOPY;  Service: Cardiopulmonary;;   CATARACT EXTRACTION, BILATERAL     CESAREAN SECTION     CHOLECYSTECTOMY     COLONOSCOPY  10/2016   COLONOSCOPY WITH PROPOFOL N/A 08/31/2021   Procedure: COLONOSCOPY WITH PROPOFOL;  Surgeon: Tressia Danas, MD;  Location: [facility] ENDOSCOPY;  Service: Gastroenterology;  Laterality: N/A;   ESOPHAGOGASTRODUODENOSCOPY (EGD) WITH PROPOFOL N/A 08/31/2021   Procedure: ESOPHAGOGASTRODUODENOSCOPY (EGD) WITH PROPOFOL;  Surgeon: Tressia Danas, MD;  Location: [facility] ENDOSCOPY;  Service: Gastroenterology;  Laterality: N/A;   HAND SURGERY Bilateral    carpal tunnel   HEMORRHOID SURGERY     HEMOSTASIS CONTROL  11/21/2021   Procedure: HEMOSTASIS CONTROL;  Surgeon: Oretha Milch, MD;  Location: [facility] ENDOSCOPY;  Service: Cardiopulmonary;;   INTERSTIM IMPLANT PLACEMENT     IR IMAGING GUIDED PORT INSERTION  01/17/2022   KNEE SURGERY Left    arthroscopy x3   LOWER EXTREMITY ANGIOGRAPHY N/A 07/16/2016   Procedure: Lower Extremity Angiography;  Surgeon: Yates Decamp, MD;  Location: [facility] INVASIVE CV LAB;  Service: Cardiovascular;  Laterality: N/A;   LOWER EXTREMITY INTERVENTION N/A 08/06/2016   Procedure: Lower Extremity Intervention;  Surgeon: Yates Decamp, MD;  Location: [facility] INVASIVE CV LAB;  Service: Cardiovascular;   Laterality: N/A;   NECK SURGERY     OTHER SURGICAL HISTORY  2013   bladder stimulator in back   PERIPHERAL VASCULAR BALLOON ANGIOPLASTY  08/06/2016   Procedure: Peripheral Vascular Balloon Angioplasty;  Surgeon: Yates Decamp, MD;  Location: [facility] INVASIVE CV LAB;  Service: Cardiovascular;;  Right SFA   POLYPECTOMY  08/31/2021   Procedure: POLYPECTOMY;  Surgeon: Tressia Danas, MD;  Location: [facility] ENDOSCOPY;  Service: Gastroenterology;;   TENNIS ELBOW RELEASE/NIRSCHEL PROCEDURE Right 12/2015   TOE SURGERY Right    right foot second and fourth toe   TOTAL HIP ARTHROPLASTY Left    TOTAL HIP REVISION Left 10/13/2017   Procedure: LEFT TOTAL HIP REVISION ACETABULUM, OPEN REDUCTION INTERNAL WITH BONE GRAFT FIXATION LEFT GREATER TROCHANTERIC FRACTURE;  Surgeon: Gean Birchwood, MD;  Location: [facility] ORS;  Service: Orthopedics;  Laterality: Left;   TOTAL KNEE ARTHROPLASTY Right 01/12/2016   Procedure: RIGHT TOTAL KNEE ARTHROPLASTY;  Surgeon: Beverely Low, MD;  Location: [facility] OR;  Service: Orthopedics;  Laterality: Right;   TUBAL LIGATION     UPPER GI ENDOSCOPY  10/2016   VAGINAL HYSTERECTOMY     VIDEO BRONCHOSCOPY WITH ENDOBRONCHIAL ULTRASOUND N/A 11/21/2021   Procedure: VIDEO BRONCHOSCOPY WITH ENDOBRONCHIAL ULTRASOUND;  Surgeon: Oretha Milch, MD;  Location: [facility] ENDOSCOPY;  Service: Cardiopulmonary;  Laterality: N/A;    REVIEW OF SYSTEMS:   Review of Systems  Constitutional: Positive for stable fatigue and weight loss.  negative for appetite change, chills, and fever.  HENT: Negative for mouth sores, nosebleeds, sore throat and trouble swallowing.   Eyes: Negative for eye problems and icterus.  Respiratory: Positive for stable shortness of breath and chronic cough.  Negative for hemoptysis and wheezing.   Cardiovascular: Negative for chest pain (none at this time) and leg swelling.  Gastrointestinal: Positive for occasional nausea and constipation.  Negative for abdominal pain, diarrhea, and vomiting.   Genitourinary: Negative for bladder incontinence, difficulty urinating, dysuria, frequency and hematuria.   Musculoskeletal: Positive for chronic back pain.  Negative for gait problem, neck pain and neck stiffness.  Skin: Negative for itching and rash.  Neurological: Positive for neuropathy in her feet.  Negative for dizziness, extremity weakness, gait problem, headaches, light-headedness and seizures.  Hematological: Negative for adenopathy. Does not bruise/bleed easily.  Psychiatric/Behavioral: Negative for confusion, depression and sleep disturbance. The patient is not nervous/anxious.     PHYSICAL EXAMINATION:  Blood pressure (!) 137/92, pulse 97, temperature 98 F (36.7 C), temperature source Oral, resp. rate 18, height  (1.6 m), weight 177 lb 6.4 oz (80.5 kg), SpO2 100 %.  ECOG PERFORMANCE STATUS: 1  Physical Exam  Constitutional: Oriented to person, place, and time and well-developed, well-nourished, and in no distress.  HENT:  Head: Normocephalic and atraumatic.  Mouth/Throat: Oropharynx is clear and moist. No oropharyngeal exudate.  Eyes: Conjunctivae are normal. Right eye exhibits no discharge. Left eye exhibits no discharge. No scleral icterus.  Neck: Normal range of motion. Neck supple.  Cardiovascular: Normal rate, regular rhythm, normal heart sounds and intact distal pulses.   Pulmonary/Chest: Effort normal and breath sounds normal. No respiratory distress. No wheezes. No rales.  Abdominal: Soft. Bowel sounds are normal. Exhibits no distension and no mass. There is no tenderness.  Musculoskeletal: Normal range of motion. Exhibits no edema.  Lymphadenopathy:    No cervical adenopathy.  Neurological: Alert and oriented to person, place, and time. Exhibits muscle wasting.  She was examined in the wheelchair.  Skin: Skin is warm and dry. No rash noted. Not diaphoretic. No erythema. No pallor.  Psychiatric: Mood, memory and judgment normal.  Vitals reviewed.  LABORATORY  DATA: Lab Results  Component Value Date   WBC 4.0 04/30/2022   HGB 8.9 (L) 04/30/2022   HCT 26.9 (L) 04/30/2022   MCV 96.8 04/30/2022   PLT 157 04/30/2022      Chemistry      Component Value Date/Time   NA 135 04/30/2022 1405   NA 140 09/28/2019 0948   K 3.3 (L) 04/30/2022 1405   K 3.5 03/19/2011 0722   CL 98 04/30/2022 1405   CO2 31 04/30/2022 1405   BUN 9 04/30/2022 1405   BUN 12 09/28/2019 0948   CREATININE 0.72 04/30/2022 1405      Component Value Date/Time   CALCIUM 8.8 (L) 04/30/2022 1405   ALKPHOS 82 04/30/2022 1405   AST 17 04/30/2022 1405   ALT 14 04/30/2022 1405   BILITOT 0.3 04/30/2022 1405       RADIOGRAPHIC STUDIES:  DG Chest 2 View  Result Date: 04/21/2022 CLINICAL DATA:  Fever, cough and confusion. EXAM: CHEST - 2 VIEW COMPARISON:  February 08, 2022 FINDINGS: There is stable right-sided venous Port-A-Cath positioning. The heart size and mediastinal contours are  within normal limits. Low lung volumes are noted. Very mild atelectatic changes are seen within bilateral lung bases. There is no evidence of an acute infiltrate, pleural effusion or pneumothorax. Radiopaque surgical clips are seen within the right upper quadrant. A radiopaque fusion plate and screws are seen overlying the lower cervical spine. The visualized skeletal structures are unremarkable. IMPRESSION: Low lung volumes without evidence of acute or active cardiopulmonary disease. Electronically Signed   By: Aram Candela M.D.   On: 04/21/2022 19:42     ASSESSMENT/PLAN:  This is a very pleasant 72 year old Caucasian female diagnosed with extensive stage (T4, N2, M1 C) small cell lung cancer.  The patient presented with a large right lower lobe consolidative mass in addition to right hilar mediastinal lymphadenopathy and extensive bony metastases.  The patient was diagnosed in November 2023.   Patient completed palliative radiotherapy to the left scapular region and right hip under the care of Dr.  Roselind Messier which was completed on 01/02/2022.   The patient is currently undergoing palliative systemic chemotherapy with carboplatin for an AUC of 5 on day 1, etoposide 100 mg/m on days 1, 2, and 3 with Cosela 240 mg as well as Imfinzi 1500 mg on day 1.  The patient receives treatment IV every 3 weeks.  She is status post 5 cycles.  She does tolerate treatment well although she had cytopenias on labs which require supportive measures such as blood transfusions. Starting from cycle #5, she started maintenance immunotherapy with Imfinzi every 4 weeks.   Labs were reviewed. Recommend that she proceed with cycle #6 today as scheduled.   I will arrange for a restaging CT scan prior to his next cycle of treatment.   We will see him back for a follow up visit in 4 weeks for evaluation and repeat blood work before starting cycle #7.   The patient mention she had an episode of chest discomfort.  We reviewed signs and symptoms that would warrant emergency room evaluation including chest pain, shortness of breath, lightheadedness, diaphoresis, nausea, syncope, etc.  Her vitals are normal and she is not having any unusual symptoms at this time.  Patient follows with a pain clinic for chronic back pain and neuropathy.  She is currently on Cymbalta.  She is also on oxycodone 10 mg every 4 hours.  We discussed the importance of writing down the time that she takes pain medication to avoid taking more than what is prescribed.  She was curious about signs and symptoms of opioid overdose because she is fearful of pain medications.  We reviewed signs and symptoms.  We will also give her a handout on her AVS to share with her husband.  She may want to consider discussing alternatives with her pain management doctor if she is concerned.  I also discussed the may consider sending Narcan to have at the house.   I sent her refill of Protonix and Compazine.  The patient was advised to call immediately if she has any concerning  symptoms in the interval. The patient voices understanding of current disease status and treatment options and is in agreement with the current care plan. All questions were answered. The patient knows to call the clinic with any problems, questions or concerns. We can certainly see the patient much sooner if necessary      No orders of the defined types were placed in this encounter.    The total time spent in the appointment was 20-29 minutes.   Jamere Stidham L Woodrow Dulski,  PA-C 04/30/22

## 2022-04-29 ENCOUNTER — Other Ambulatory Visit (HOSPITAL_COMMUNITY): Payer: Self-pay

## 2022-04-29 MED ORDER — OXYCODONE HCL 10 MG PO TABS
10.0000 mg | ORAL_TABLET | ORAL | 0 refills | Status: DC | PRN
  Filled 2022-04-29: qty 180, 30d supply, fill #0

## 2022-04-30 ENCOUNTER — Inpatient Hospital Stay: Payer: Medicare Other | Attending: Radiation Oncology

## 2022-04-30 ENCOUNTER — Other Ambulatory Visit: Payer: Self-pay

## 2022-04-30 ENCOUNTER — Inpatient Hospital Stay: Payer: Medicare Other | Admitting: Physician Assistant

## 2022-04-30 ENCOUNTER — Inpatient Hospital Stay: Payer: Medicare Other

## 2022-04-30 ENCOUNTER — Other Ambulatory Visit (HOSPITAL_COMMUNITY): Payer: Self-pay

## 2022-04-30 VITALS — BP 137/92 | HR 97 | Temp 98.0°F | Resp 18 | Ht 63.0 in | Wt 177.4 lb

## 2022-04-30 DIAGNOSIS — Z79620 Long term (current) use of immunosuppressive biologic: Secondary | ICD-10-CM | POA: Insufficient documentation

## 2022-04-30 DIAGNOSIS — C3431 Malignant neoplasm of lower lobe, right bronchus or lung: Secondary | ICD-10-CM | POA: Diagnosis present

## 2022-04-30 DIAGNOSIS — C7951 Secondary malignant neoplasm of bone: Secondary | ICD-10-CM | POA: Insufficient documentation

## 2022-04-30 DIAGNOSIS — T451X5A Adverse effect of antineoplastic and immunosuppressive drugs, initial encounter: Secondary | ICD-10-CM | POA: Diagnosis not present

## 2022-04-30 DIAGNOSIS — D6481 Anemia due to antineoplastic chemotherapy: Secondary | ICD-10-CM | POA: Diagnosis not present

## 2022-04-30 DIAGNOSIS — Z5112 Encounter for antineoplastic immunotherapy: Secondary | ICD-10-CM | POA: Insufficient documentation

## 2022-04-30 DIAGNOSIS — C3491 Malignant neoplasm of unspecified part of right bronchus or lung: Secondary | ICD-10-CM

## 2022-04-30 DIAGNOSIS — Z79899 Other long term (current) drug therapy: Secondary | ICD-10-CM | POA: Diagnosis not present

## 2022-04-30 DIAGNOSIS — Z95828 Presence of other vascular implants and grafts: Secondary | ICD-10-CM

## 2022-04-30 LAB — CMP (CANCER CENTER ONLY)
ALT: 14 U/L (ref 0–44)
AST: 17 U/L (ref 15–41)
Albumin: 3.4 g/dL — ABNORMAL LOW (ref 3.5–5.0)
Alkaline Phosphatase: 82 U/L (ref 38–126)
Anion gap: 6 (ref 5–15)
BUN: 9 mg/dL (ref 8–23)
CO2: 31 mmol/L (ref 22–32)
Calcium: 8.8 mg/dL — ABNORMAL LOW (ref 8.9–10.3)
Chloride: 98 mmol/L (ref 98–111)
Creatinine: 0.72 mg/dL (ref 0.44–1.00)
GFR, Estimated: >60 mL/min (ref >60)
Glucose, Bld: 126 mg/dL — ABNORMAL HIGH (ref 70–99)
Potassium: 3.3 mmol/L — ABNORMAL LOW (ref 3.5–5.1)
Sodium: 135 mmol/L (ref 135–145)
Total Bilirubin: 0.3 mg/dL (ref 0.3–1.2)
Total Protein: 6.9 g/dL (ref 6.5–8.1)

## 2022-04-30 LAB — CBC WITH DIFFERENTIAL (CANCER CENTER ONLY)
Abs Immature Granulocytes: 0 K/uL (ref 0.00–0.07)
Basophils Absolute: 0 K/uL (ref 0.0–0.1)
Basophils Relative: 1 %
Eosinophils Absolute: 0.2 K/uL (ref 0.0–0.5)
Eosinophils Relative: 4 %
HCT: 26.9 % — ABNORMAL LOW (ref 36.0–46.0)
Hemoglobin: 8.9 g/dL — ABNORMAL LOW (ref 12.0–15.0)
Immature Granulocytes: 0 %
Lymphocytes Relative: 16 %
Lymphs Abs: 0.6 K/uL — ABNORMAL LOW (ref 0.7–4.0)
MCH: 32 pg (ref 26.0–34.0)
MCHC: 33.1 g/dL (ref 30.0–36.0)
MCV: 96.8 fL (ref 80.0–100.0)
Monocytes Absolute: 0.3 K/uL (ref 0.1–1.0)
Monocytes Relative: 8 %
Neutro Abs: 2.8 K/uL (ref 1.7–7.7)
Neutrophils Relative %: 71 %
Platelet Count: 157 K/uL (ref 150–400)
RBC: 2.78 MIL/uL — ABNORMAL LOW (ref 3.87–5.11)
RDW: 17.5 % — ABNORMAL HIGH (ref 11.5–15.5)
WBC Count: 4 K/uL (ref 4.0–10.5)
nRBC: 0 % (ref 0.0–0.2)

## 2022-04-30 LAB — TSH: TSH: 1.279 u[IU]/mL (ref 0.350–4.500)

## 2022-04-30 LAB — SAMPLE TO BLOOD BANK

## 2022-04-30 MED ORDER — PANTOPRAZOLE SODIUM 40 MG PO TBEC
40.0000 mg | DELAYED_RELEASE_TABLET | Freq: Every day | ORAL | 2 refills | Status: DC | PRN
  Filled 2022-04-30: qty 30, 30d supply, fill #0
  Filled 2022-07-23: qty 30, 30d supply, fill #1

## 2022-04-30 MED ORDER — SODIUM CHLORIDE 0.9 % IV SOLN: Freq: Once | INTRAVENOUS | Status: AC

## 2022-04-30 MED ORDER — PROCHLORPERAZINE MALEATE 10 MG PO TABS
10.0000 mg | ORAL_TABLET | Freq: Four times a day (QID) | ORAL | 2 refills | Status: DC | PRN
  Filled 2022-04-30: qty 30, 8d supply, fill #0

## 2022-04-30 MED ORDER — SODIUM CHLORIDE 0.9% FLUSH
10.0000 mL | INTRAVENOUS | Status: DC | PRN
  Administered 2022-04-30: 10 mL

## 2022-04-30 MED ORDER — SODIUM CHLORIDE 0.9% FLUSH
10.0000 mL | Freq: Once | INTRAVENOUS | Status: AC
  Administered 2022-04-30: 10 mL

## 2022-04-30 MED ORDER — HEPARIN SOD (PORK) LOCK FLUSH 100 UNIT/ML IV SOLN
500.0000 [IU] | Freq: Once | INTRAVENOUS | Status: AC | PRN
  Administered 2022-04-30: 500 [IU]

## 2022-04-30 MED ORDER — SODIUM CHLORIDE 0.9 % IV SOLN
1500.0000 mg | Freq: Once | INTRAVENOUS | Status: AC
  Administered 2022-04-30: 1500 mg via INTRAVENOUS
  Filled 2022-04-30: qty 30

## 2022-04-30 NOTE — Patient Instructions (Signed)
[facility]  Discharge Instructions: Thank you for choosing [facility] to provide your oncology and hematology care.   If you have a lab appointment with the Cancer Center, please go directly to the Cancer Center and check in at the registration area.   Wear comfortable clothing and clothing appropriate for easy access to any Portacath or PICC line.   We strive to give you quality time with your provider. You may need to reschedule your appointment if you arrive late (15 or more minutes).  Arriving late affects you and other patients whose appointments are after yours.  Also, if you miss three or more appointments without notifying the office, you may be dismissed from the clinic at the provider's discretion.      For prescription refill requests, have your pharmacy contact our office and allow 72 hours for refills to be completed.    Today you received the following chemotherapy and/or immunotherapy agents: durvalumab        To help prevent nausea and vomiting after your treatment, we encourage you to take your nausea medication as directed.  BELOW ARE SYMPTOMS THAT SHOULD BE REPORTED IMMEDIATELY: *FEVER GREATER THAN 100.4 F (38 C) OR HIGHER *CHILLS OR SWEATING *NAUSEA AND VOMITING THAT IS NOT CONTROLLED WITH YOUR NAUSEA MEDICATION *UNUSUAL SHORTNESS OF BREATH *UNUSUAL BRUISING OR BLEEDING *URINARY PROBLEMS (pain or burning when urinating, or frequent urination) *BOWEL PROBLEMS (unusual diarrhea, constipation, pain near the anus) TENDERNESS IN MOUTH AND THROAT WITH OR WITHOUT PRESENCE OF ULCERS (sore throat, sores in mouth, or a toothache) UNUSUAL RASH, SWELLING OR PAIN  UNUSUAL VAGINAL DISCHARGE OR ITCHING   Items with * indicate a potential emergency and should be followed up as soon as possible or go to the Emergency Department if any problems should occur.  Please show the CHEMOTHERAPY ALERT CARD or IMMUNOTHERAPY ALERT CARD at  check-in to the Emergency Department and triage nurse.  Should you have questions after your visit or need to cancel or reschedule your appointment, please contact [facility]  Dept: [phone]  and follow the prompts.  Office hours are 8:00 a.m. to 4:30 p.m. Monday - Friday. Please note that voicemails left after 4:00 p.m. may not be returned until the following business day.  We are closed weekends and major holidays. You have access to a nurse at all times for urgent questions. Please call the main number to the clinic Dept: [phone] and follow the prompts.   For any non-urgent questions, you may also contact your provider using MyChart. We now offer e-Visits for anyone 18 and older to request care online for non-urgent symptoms. For details visit [email or web address].   Also download the MyChart app! Go to the app store, search "MyChart", open the app, select , and log in with your MyChart username and password.   

## 2022-05-02 LAB — T4: T4, Total: 7.5 ug/dL (ref 4.5–12.0)

## 2022-05-03 ENCOUNTER — Other Ambulatory Visit: Payer: Self-pay

## 2022-05-15 ENCOUNTER — Ambulatory Visit: Payer: Medicare Other

## 2022-05-15 ENCOUNTER — Ambulatory Visit: Payer: Medicare Other | Admitting: Internal Medicine

## 2022-05-15 ENCOUNTER — Other Ambulatory Visit: Payer: Medicare Other

## 2022-05-24 ENCOUNTER — Other Ambulatory Visit (HOSPITAL_COMMUNITY): Admission: RE | Admit: 2022-05-24 | Payer: Medicare Other | Source: Ambulatory Visit

## 2022-05-24 ENCOUNTER — Ambulatory Visit (HOSPITAL_COMMUNITY)
Admission: RE | Admit: 2022-05-24 | Discharge: 2022-05-24 | Disposition: A | Discharge status: Home / Self Care | Payer: Medicare Other | Source: Ambulatory Visit | Attending: Physician Assistant | Admitting: Physician Assistant

## 2022-05-24 ENCOUNTER — Encounter (HOSPITAL_COMMUNITY): Payer: Self-pay

## 2022-05-24 DIAGNOSIS — C3491 Malignant neoplasm of unspecified part of right bronchus or lung: Secondary | ICD-10-CM | POA: Insufficient documentation

## 2022-05-24 MED ORDER — SODIUM CHLORIDE (PF) 0.9 % IJ SOLN
INTRAMUSCULAR | Status: AC
  Filled 2022-05-24: qty 50

## 2022-05-24 MED ORDER — IOHEXOL 300 MG/ML  SOLN
100.0000 mL | Freq: Once | INTRAMUSCULAR | Status: AC | PRN
  Administered 2022-05-24: 100 mL via INTRAVENOUS

## 2022-05-27 ENCOUNTER — Ambulatory Visit: Payer: Medicare Other | Admitting: Cardiology

## 2022-05-27 ENCOUNTER — Encounter: Payer: Self-pay | Admitting: Cardiology

## 2022-05-27 VITALS — BP 149/81 | HR 103 | Resp 16 | Ht 63.0 in | Wt 173.8 lb

## 2022-05-27 DIAGNOSIS — I739 Peripheral vascular disease, unspecified: Secondary | ICD-10-CM

## 2022-05-27 DIAGNOSIS — F17200 Nicotine dependence, unspecified, uncomplicated: Secondary | ICD-10-CM

## 2022-05-27 DIAGNOSIS — R072 Precordial pain: Secondary | ICD-10-CM

## 2022-05-27 MED ORDER — METOPROLOL SUCCINATE ER 25 MG PO TB24
25.00 mg | ORAL_TABLET | Freq: Every day | ORAL | 2 refills | Status: DC
Start: 2022-05-27 — End: 2022-07-22

## 2022-05-27 MED ORDER — NITROGLYCERIN 0.4 MG SL SUBL
0.4000 mg | SUBLINGUAL_TABLET | SUBLINGUAL | 1 refills | Status: DC | PRN
Start: 2022-05-27 — End: 2022-09-12

## 2022-05-27 NOTE — Progress Notes (Signed)
Primary Physician/Referring:  [name], NP  Patient ID: [name], female    DOB: 1951-01-02, 72 y.o.   MRN: [id number]  Chief Complaint  Patient presents with   Chest Pain   Follow-up    Referred by [name], NP   HPI:    [name]  is a 72 y.o. 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  Patient was referred back to me for evaluation of chest pain that started about couple months ago, states that she has had about 2 episodes of chest pain last episode was 3 weeks ago.  Chest pain is located in the center of the chest and last for about 5 to 10 minutes with or without exertional activity.  Dyspnea has remained stable.  Past Medical History:  Diagnosis Date   Anxiety    Aortic atherosclerosis (HCC)    Bilateral cataracts    Carpal tunnel syndrome    Cervical cancer (HCC)    Chronic back pain    Claudication (HCC)    COPD (chronic obstructive pulmonary disease) (HCC)    wheezing   DDD (degenerative disc disease), cervical    DDD (degenerative disc disease), lumbar    Depression    Diverticulosis    Dyspnea    on exertion   Gastroparesis    GERD (gastroesophageal reflux disease)    Hearing loss    Bilateral   History of blood transfusion    History of colon polyps    History of migraine    History of radiation therapy    Left scapula,left chest, right  pelvis- 12/20/21-01/02/22- Dr. 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  Trying to quit.  Has patches, afraid to use with chemo/radiation.  Substance Use Topics  Alcohol use: No    Alcohol/week: 0.0 standard drinks of alcohol  Marital Status: Married    ROS  Review of Systems  Cardiovascular:  Positive for chest pain and dyspnea on exertion. Negative for leg swelling.   Objective     Vitals:   05/27/22 1541  BP: (!) 149/81  Pulse: (!) 103  Resp: 16  Height: 5\' 3"  (1.6 m)  Weight: 173 lb 12.8 oz (78.8 kg)  SpO2: 95%  BMI (Calculated): 30.79    Orthostatic VS for the past 72 hrs (Last 3 readings):  Patient Position BP Location Cuff Size  05/27/22 1541 Sitting Left Arm Normal     Physical Exam Constitutional:      Comments: She has short stature and moderately obese in no acute distress.  Neck:     Thyroid: No thyromegaly.     Vascular: Carotid bruit (bilateral) present. No JVD.  Cardiovascular:     Rate and Rhythm: Normal rate and regular rhythm.     Pulses: Normal pulses and intact distal pulses.      Heart sounds: Normal heart sounds. No murmur heard.    No gallop.  Pulmonary:     Effort: Pulmonary effort is normal.     Breath sounds: Normal breath sounds.  Abdominal:     General: Bowel sounds are normal.     Palpations: Abdomen is soft.     Comments: Obese   Musculoskeletal:     Right lower leg: No edema.     Left lower leg: No edema.    Laboratory examination:   Recent Labs    03/20/22 0946 04/21/22 1916 04/30/22 1405  NA 135 132* 135  K 3.9 3.6 3.3*  CL 99 96* 98  CO2 30 27 31   GLUCOSE 103* 115* 126*  BUN 10 9 9   CREATININE 0.67 0.73 0.72  CALCIUM 8.2* 8.6* 8.8*  GFRNONAA >60 >60 >60   CrCl cannot be calculated (Patient's most recent lab result is older than the maximum 21 days allowed.).     Latest Ref Rng & Units 04/30/2022    2:05 PM 04/21/2022    7:16 PM 03/20/2022    9:46 AM  CMP  Glucose 70 - 99 mg/dL 295  621  308   BUN 8 - 23 mg/dL 9  9  10    Creatinine 0.44 - 1.00 mg/dL 6.57  8.46  9.62   Sodium 135 - 145 mmol/L 135  132  135   Potassium 3.5 - 5.1 mmol/L 3.3  3.6  3.9   Chloride 98 - 111 mmol/L 98  96  99   CO2 22 - 32 mmol/L 31  27  30    Calcium 8.9 - 10.3 mg/dL 8.8  8.6  8.2   Total Protein 6.5 - 8.1 g/dL 6.9   6.6   Total Bilirubin 0.3 - 1.2 mg/dL 0.3   0.4   Alkaline Phos 38 - 126 U/L 82   102   AST 15 - 41 U/L 17   12   ALT 0 - 44 U/L 14   9       Latest Ref Rng & Units 04/30/2022    2:05 PM 04/21/2022    7:16 PM 03/20/2022    9:46 AM  CBC  WBC 4.0 - 10.5 K/uL 4.0  6.7  3.1   Hemoglobin 12.0 - 15.0 g/dL 8.9  9.7  95.2   Hematocrit 36.0 - 46.0 % 26.9  29.6  30.2   Platelets 150 - 400 K/uL  157  130  109    HEMOGLOBIN A1C Lab Results  Component Value Date   HGBA1C 6.1 (H) 01/02/2016   MPG 128 01/02/2016   TSH Recent Labs    12/26/21 1119 02/06/22 1121 04/30/22 1405  TSH 0.688 2.968 1.279   Upper GI endoscopy 11/19/2019: Esophagus tortuous throughout questionable large caliber distal ring, not dilated due to retained gastric  contents. Stomach reveals large volume retained gastric contents and isolated prepyloric erosion.  Duodenum was normal. Impression: 1. Gastroparesis with large volume retained gastric contents. Suspect secondary to narcotics 2. Small prepyloric erosion.  Cardiac Studies:   Peripheral arteriogram 07/16/2016:  Right SFA 95% stenosis distal. Otherwise 3 vessel R/O bilateral lower extremity.  Left renal artery 95% stenosis.   08/06/2016: Patient randomized in Surveil DCB Study: Successful PTA and drug coated balloon angioplasty with a 5.0 x 60 mm  Inpact Admiral balloon of the right mid SFA, stenosis reduced from 99% to 0%. Mild disease left.  Nuclear stress test   12/29/2017: 1. Lexiscan stress test was performed. Exercise capacity was not assessed. Stress symptoms included headache. Peak effect blood pressure was 172/68 mmHg. The resting and stress electrocardiogram demonstrated normal sinus rhythm, normal resting conduction, no resting arrhythmias and normal rest repolarization. Stress EKG is non diagnostic for ischemia as it is a pharmacologic stress. 2. The overall quality of the study is good. There is no evidence of abnormal lung activity. Stress SPECT images demonstrate homogeneous tracer distribution throughout the myocardium. Decreased tracer uptake in basal inferoseptal myocardium seen only on SPECT rest images likely represents tissue attenuation artifact. Gated SPECT imaging reveals normal myocardial thickening and wall motion. The left ventricular ejection fraction was normal (68%). 3. Low risk study.  Lower Extremity Arterial Duplex 01/25/2020: No hemodynamically significant stenoses are identified in the right and left lower extremity arterial system.   This exam reveals mildly decreased perfusion of the right lower extremity, noted at the post tibial artery level (ABI 0.83) and moderately decreased perfusion of the left lower extremity, noted at the anterior tibial and post tibial  artery level (ABI 0.72).  No significant change from 11/27/2015.  Carotid artery duplex 07/19/2020: Duplex suggests stenosis in the right internal carotid artery (16-49%). Duplex suggests stenosis in the right external carotid artery (<50%). Duplex suggests stenosis in the left internal carotid artery (16-49%). Antegrade right vertebral artery flow. Antegrade left vertebral artery flow. Compared to the study done on 12/21/2019, mild decrease in stenosis severity bilaterally.  However compared to prior exam 12/23/2018, no significant change. Follow up in one year is appropriate if clinically indicated.  EKG:  EKG 05/27/2022: Sinus tachycardia at rate of 11 2 bpm, left atrial enlargement, normal axis.  IRBBB, poor R progression, cannot exclude anteroseptal infarct old.  Compared to 03/03/2020, no significant change.   Medications and allergies   Allergies  Allergen Reactions   Bee Venom Anaphylaxis   Morphine And Related Other (See Comments)    Overly sedated   Neurontin [Gabapentin] Other (See Comments)    Nightmares    Codeine Hives   Adhesive [Tape] Itching and Other (See Comments)    Redness, Paper tape is ok   Sulfa Antibiotics Other (See Comments)    Childhood allergy Unknown reaction   Sulfonamide Derivatives Other (See Comments)    Childhood allergy Unknown reaction    Current Outpatient Medications:    acetaminophen (TYLENOL) 325 MG tablet, Take 650 mg by mouth 2 (two) times daily as needed for headache., Disp: , Rfl:  albuterol (PROVENTIL) (2.5 MG/3ML) 0.083% nebulizer solution, Inhale 3 mLs into the lungs every 4 (four) hours as needed for wheezing or shortness of breath (cough). (Patient taking differently: Inhale 3 mLs into the lungs 2 (two) times daily as needed for wheezing or shortness of breath (cough).), Disp: 75 mL, Rfl: 12   albuterol (VENTOLIN HFA) 108 (90 Base) MCG/ACT inhaler, Inhale 2 puffs into the lungs 2 (two) times daily as needed for wheezing or shortness  of breath., Disp: , Rfl:    aspirin EC (ASPIRIN 81) 81 MG tablet, Take 1 tablet (81 mg total) by mouth daily., Disp: 90 tablet, Rfl: 3   benzonatate (TESSALON) 100 MG capsule, Take 1 capsule (100 mg total) by mouth 2 (two) times daily as needed for cough., Disp: 20 capsule, Rfl: 0   busPIRone (BUSPAR) 5 MG tablet, Take 1 tablet (5 mg total) by mouth 2 (two) times daily., Disp: 60 tablet, Rfl: 0   Cholecalciferol (VITAMIN D-3 PO), Take 2,000 Units by mouth daily., Disp: , Rfl:    cyanocobalamin (VITAMIN B12) 1000 MCG/ML injection, Inject 1,000 mcg into the muscle every 30 (thirty) days., Disp: , Rfl:    docusate sodium (COLACE) 100 MG capsule, Take 200 mg by mouth daily as needed (constipation)., Disp: , Rfl:    DULoxetine (CYMBALTA) 60 MG capsule, Take 2 capsules (120 mg total) by mouth daily., Disp: 60 capsule, Rfl: 3   ferrous sulfate 324 MG TBEC, Take 324 mg by mouth., Disp: , Rfl:    guaiFENesin-codeine 100-10 MG/5ML syrup, Take 10 mLs by mouth 3 (three) times daily as needed., Disp: , Rfl:    hydrocortisone (ANUSOL-HC) 25 MG suppository, Place 1 suppository (25 mg total) rectally 2 (two) times daily as needed for hemorrhoids or anal itching., Disp: 12 suppository, Rfl: 0   lidocaine-prilocaine (EMLA) cream, Apply 30 minutes before appointment, Disp: 30 g, Rfl: 2   linaclotide (LINZESS) 290 MCG CAPS capsule, Take 1 capsule (290 mcg total) by mouth daily before breakfast. (Patient taking differently: Take 290 mcg by mouth daily as needed (Constipation).), Disp: 30 capsule, Rfl: 1   losartan (COZAAR) 25 MG tablet, Take 25 mg by mouth daily., Disp: , Rfl:    methocarbamol (ROBAXIN) 500 MG tablet, Take 1 tablet (500 mg total) by mouth every 6 (six) hours as needed for muscle spasms., Disp: 60 tablet, Rfl: 0   metoCLOPramide (REGLAN) 5 MG tablet, Take 1 tablet by mouth 3 times daily 20-30 minutes before meals NO FURTHER REFILLS UNTIL SEEN, NEEDS AND APPOINTMENT (Patient taking differently: Take 5 mg by  mouth 3 (three) times daily before meals.), Disp: 90 tablet, Rfl: 0   metoprolol succinate (TOPROL-XL) 25 MG 24 hr tablet, Take 1 tablet (25 mg total) by mouth daily. Take with or immediately following a meal., Disp: 30 tablet, Rfl: 2   Oxycodone HCl 10 MG TABS, Take 10 mg by mouth 5 (five) times daily as needed (for moderate pain)., Disp: , Rfl:    pantoprazole (PROTONIX) 40 MG tablet, Take 1 tablet (40 mg total) by mouth daily as needed (acid reflux)., Disp: 30 tablet, Rfl: 2   potassium chloride SA (KLOR-CON M) 20 MEQ tablet, Take 1 tablet (20 mEq total) by mouth 2 (two) times daily., Disp: 14 tablet, Rfl: 0   prochlorperazine (COMPAZINE) 10 MG tablet, Take 1 tablet (10 mg total) by mouth every 6 (six) hours as needed for nausea or vomiting., Disp: 30 tablet, Rfl: 2   rOPINIRole (REQUIP) 1 MG tablet, Take 1 tablet (  1 mg total) by mouth 3 (three) times daily as directed, Disp: 270 tablet, Rfl: 1   rosuvastatin (CRESTOR) 10 MG tablet, Take 10 mg by mouth at bedtime., Disp: , Rfl:    senna (SENOKOT) 8.6 MG tablet, Take 17.2 mg by mouth daily as needed for constipation., Disp: , Rfl:    Tiotropium Bromide-Olodaterol (STIOLTO RESPIMAT) 2.5-2.5 MCG/ACT AERS, Inhale 2 puffs into the lungs daily., Disp: 1 each, Rfl: 5   traZODone (DESYREL) 50 MG tablet, TAKE 0.5-1 TABLETS (25-50 MG TOTAL) BY MOUTH AT BEDTIME AS NEEDED FOR SLEEP. (Patient taking differently: Take 50 mg by mouth at bedtime.), Disp: 90 tablet, Rfl: 1   nitroGLYCERIN (NITROSTAT) 0.4 MG SL tablet, Place 1 tablet (0.4 mg total) under the tongue every 5 (five) minutes x 3 doses as needed for chest pain., Disp: 25 tablet, Rfl: 1  Assessment     ICD-10-CM   1. Precordial pain  R07.2 EKG 12-Lead    nitroGLYCERIN (NITROSTAT) 0.4 MG SL tablet    metoprolol succinate (TOPROL-XL) 25 MG 24 hr tablet    2. Peripheral artery disease (HCC)  I73.9     3. Tobacco use disorder  F17.200       EKG 01/13/2019: Normal sinus rhythm at the rate of 85 bpm,  left atrial enlargement, normal axis.  Poor R-wave progression, cannot exclude anteroseptal infarct old.  No evidence of ischemia.  Low-voltage complexes.  Pulmonary disease pattern. No significant change from  EKG 11/17/2017   Recommendations:   Meds ordered this encounter  Medications   nitroGLYCERIN (NITROSTAT) 0.4 MG SL tablet    Sig: Place 1 tablet (0.4 mg total) under the tongue every 5 (five) minutes x 3 doses as needed for chest pain.    Dispense:  25 tablet    Refill:  1   metoprolol succinate (TOPROL-XL) 25 MG 24 hr tablet    Sig: Take 1 tablet (25 mg total) by mouth daily. Take with or immediately following a meal.    Dispense:  30 tablet    Refill:  2    [name]  is a 72 y.o. 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  1. Precordial pain Patient was referred back to me for evaluation of chest pain, retrosternal chest pain that last for 5 to 10 minutes with or without exertional activity, I cannot completely exclude angina pectoris.  However symptoms are occurring once every 2 to 3 weeks.  I prescribed her sublingual nitroglycerin new prescription and advised her to continue to use this and let me know if she has increased frequency of use.  For now conservative therapy.  I also added metoprolol succinate 25 mg daily both for hypertension and angina pectoris.  - EKG 12-Lead -  nitroGLYCERIN (NITROSTAT) 0.4 MG SL tablet; Place 1 tablet (0.4 mg total) under the tongue every 5 (five) minutes x 3 doses as needed for chest pain.  Dispense: 25 tablet; Refill: 1 - metoprolol succinate (TOPROL-XL) 25 MG 24 hr  tablet; Take 1 tablet (25 mg total) by mouth daily. Take with or immediately following a meal.  Dispense: 30 tablet; Refill: 2  2. Peripheral artery disease (HCC) Remains asymptomatic from a PAD standpoint, has started to smoke again, patient is presently getting chemotherapy for stage IV lung cancer advised her to continue to remain abstinent from tobacco.  3. Tobacco use disorder As dictated above.  Would like to see her back in 2 months for follow-up.  No tests were ordered.  She has bilateral mild carotid stenosis but I did not make any changes to her orders for follow-up recommendation regarding this.  Will wait and see how she does with her metastatic lung disease.  Yates Decamp, MD, [facility] 05/27/2022, 4:23 PM Office: [phone] Pager: [phone]

## 2022-05-28 ENCOUNTER — Other Ambulatory Visit: Payer: Self-pay

## 2022-05-28 ENCOUNTER — Inpatient Hospital Stay: Payer: Medicare Other

## 2022-05-28 ENCOUNTER — Inpatient Hospital Stay: Payer: Medicare Other | Attending: Radiation Oncology | Admitting: Internal Medicine

## 2022-05-28 ENCOUNTER — Inpatient Hospital Stay: Payer: Medicare Other | Admitting: Dietician

## 2022-05-28 ENCOUNTER — Encounter: Payer: Self-pay | Admitting: Medical Oncology

## 2022-05-28 ENCOUNTER — Other Ambulatory Visit (HOSPITAL_COMMUNITY): Payer: Self-pay

## 2022-05-28 VITALS — BP 151/76 | HR 93 | Temp 97.8°F | Resp 16 | Wt 171.6 lb

## 2022-05-28 DIAGNOSIS — Z5112 Encounter for antineoplastic immunotherapy: Secondary | ICD-10-CM | POA: Insufficient documentation

## 2022-05-28 DIAGNOSIS — C3491 Malignant neoplasm of unspecified part of right bronchus or lung: Secondary | ICD-10-CM

## 2022-05-28 DIAGNOSIS — C3431 Malignant neoplasm of lower lobe, right bronchus or lung: Secondary | ICD-10-CM | POA: Insufficient documentation

## 2022-05-28 DIAGNOSIS — Z95828 Presence of other vascular implants and grafts: Secondary | ICD-10-CM

## 2022-05-28 DIAGNOSIS — E876 Hypokalemia: Secondary | ICD-10-CM | POA: Insufficient documentation

## 2022-05-28 DIAGNOSIS — C7951 Secondary malignant neoplasm of bone: Secondary | ICD-10-CM | POA: Diagnosis present

## 2022-05-28 DIAGNOSIS — Z79899 Other long term (current) drug therapy: Secondary | ICD-10-CM | POA: Diagnosis not present

## 2022-05-28 LAB — CMP (CANCER CENTER ONLY)
ALT: 10 U/L (ref 0–44)
AST: 17 U/L (ref 15–41)
Albumin: 3.5 g/dL (ref 3.5–5.0)
Alkaline Phosphatase: 71 U/L (ref 38–126)
Anion gap: 7 (ref 5–15)
BUN: 8 mg/dL (ref 8–23)
CO2: 31 mmol/L (ref 22–32)
Calcium: 8.4 mg/dL — ABNORMAL LOW (ref 8.9–10.3)
Chloride: 99 mmol/L (ref 98–111)
Creatinine: 0.78 mg/dL (ref 0.44–1.00)
GFR, Estimated: >60 mL/min (ref >60)
Glucose, Bld: 160 mg/dL — ABNORMAL HIGH (ref 70–99)
Potassium: 2.8 mmol/L — ABNORMAL LOW (ref 3.5–5.1)
Sodium: 137 mmol/L (ref 135–145)
Total Bilirubin: 0.4 mg/dL (ref 0.3–1.2)
Total Protein: 6.7 g/dL (ref 6.5–8.1)

## 2022-05-28 LAB — CBC WITH DIFFERENTIAL (CANCER CENTER ONLY)
Abs Immature Granulocytes: 0.01 10*3/uL (ref 0.00–0.07)
Basophils Absolute: 0 10*3/uL (ref 0.0–0.1)
Basophils Relative: 1 %
Eosinophils Absolute: 0.2 10*3/uL (ref 0.0–0.5)
Eosinophils Relative: 5 %
HCT: 28.2 % — ABNORMAL LOW (ref 36.0–46.0)
Hemoglobin: 9.4 g/dL — ABNORMAL LOW (ref 12.0–15.0)
Immature Granulocytes: 0 %
Lymphocytes Relative: 16 %
Lymphs Abs: 0.6 10*3/uL — ABNORMAL LOW (ref 0.7–4.0)
MCH: 33 pg (ref 26.0–34.0)
MCHC: 33.3 g/dL (ref 30.0–36.0)
MCV: 98.9 fL (ref 80.0–100.0)
Monocytes Absolute: 0.3 10*3/uL (ref 0.1–1.0)
Monocytes Relative: 7 %
Neutro Abs: 2.9 10*3/uL (ref 1.7–7.7)
Neutrophils Relative %: 71 %
Platelet Count: 143 10*3/uL — ABNORMAL LOW (ref 150–400)
RBC: 2.85 MIL/uL — ABNORMAL LOW (ref 3.87–5.11)
RDW: 16.4 % — ABNORMAL HIGH (ref 11.5–15.5)
WBC Count: 4 10*3/uL (ref 4.0–10.5)
nRBC: 0 % (ref 0.0–0.2)

## 2022-05-28 LAB — SAMPLE TO BLOOD BANK

## 2022-05-28 MED ORDER — POTASSIUM CHLORIDE 10 MEQ/100ML IV SOLN
10.0000 meq | INTRAVENOUS | Status: AC
  Administered 2022-05-28 (×2): 10 meq via INTRAVENOUS
  Filled 2022-05-28: qty 100

## 2022-05-28 MED ORDER — SODIUM CHLORIDE 0.9 % IV SOLN: Freq: Once | INTRAVENOUS | Status: AC

## 2022-05-28 MED ORDER — SODIUM CHLORIDE 0.9% FLUSH
10.0000 mL | Freq: Once | INTRAVENOUS | Status: AC
  Administered 2022-05-28: 10 mL

## 2022-05-28 MED ORDER — SODIUM CHLORIDE 0.9 % IV SOLN
1500.0000 mg | Freq: Once | INTRAVENOUS | Status: AC
  Administered 2022-05-28: 1500 mg via INTRAVENOUS
  Filled 2022-05-28: qty 30

## 2022-05-28 MED ORDER — POTASSIUM CHLORIDE CRYS ER 20 MEQ PO TBCR: 20.0000 meq | EXTENDED_RELEASE_TABLET | Freq: Two times a day (BID) | ORAL | 0 refills | Status: DC

## 2022-05-28 NOTE — Progress Notes (Signed)
[facility] [phone]   [phone]  OFFICE PROGRESS NOTE  [name], NP [address] [place] [address]  DIAGNOSIS: Extensive stage (T4, N2, M1 C) small cell lung cancer presented with large right lower lobe consolidative mass in addition to right hilar and mediastinal lymphadenopathy and extensive bone metastasis diagnosed in November 2023.   PRIOR THERAPY: None  CURRENT THERAPY: Palliative systemic chemotherapy with carboplatin for AUC of 5 on day 1, etoposide 100 Mg/M2 on days 1, 2 and 3 with Cosela 240 Mg/M2 on the days of the chemotherapy as well as Imfinzi 1500 mg IV on day 1 every 3 weeks during the induction phase and then every 4 weeks during the maintenance if there is no evidence for disease progression.  Status post 6 cycles.  Starting from cycle #5 she will be on maintenance treatment with Imfinzi 1500 Mg IV every 4 weeks.  INTERVAL HISTORY: [name] 72 y.o. female returns to the clinic today for follow-up visit.  Her husband is currently in a rehabilitation center.  She denied having any current chest pain but has the baseline shortness of breath with mild cough and no hemoptysis.  She continues to have weakness in the lower extremity and she is using a scooter for her movement.  She has no nausea, vomiting, diarrhea or constipation.  She has no headache or visual changes.  She continues to tolerate her maintenance treatment with Imfinzi fairly well.  The patient had repeat CT scan of the chest, abdomen and pelvis performed recently and she is here for evaluation and discussion of her scan results.  MEDICAL HISTORY: Past Medical History:  Diagnosis Date   Anxiety    Aortic atherosclerosis (HCC)    Bilateral cataracts    Carpal tunnel syndrome    Cervical cancer (HCC)    Chronic back pain    Claudication (HCC)    COPD (chronic obstructive pulmonary disease) (HCC)    wheezing   DDD (degenerative disc disease),  cervical    DDD (degenerative disc disease), lumbar    Depression    Diverticulosis    Dyspnea    on exertion   Gastroparesis    GERD (gastroesophageal reflux disease)    Hearing loss    Bilateral   History of blood transfusion    History of colon polyps    History of migraine    History of radiation therapy    Left scapula,left chest, right pelvis- 12/20/21-01/02/22- Dr. 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  MEDICATIONS:  Current Outpatient Medications  Medication Sig Dispense Refill   acetaminophen (TYLENOL) 325 MG tablet Take 650 mg by mouth 2 (two) times daily as needed for headache.     albuterol (PROVENTIL) (2.5 MG/3ML) 0.083% nebulizer solution Inhale 3 mLs into the lungs every 4 (four) hours as needed for wheezing or shortness of breath (cough). (Patient taking differently: Inhale 3 mLs into the lungs 2 (two) times daily as needed for wheezing or shortness of breath (cough).) 75 mL 12   albuterol (VENTOLIN HFA) 108 (90 Base) MCG/ACT inhaler Inhale 2 puffs into the lungs 2 (two) times daily as needed for wheezing or shortness of breath.     aspirin EC (ASPIRIN 81) 81 MG tablet Take 1 tablet (81 mg total) by mouth daily. 90 tablet 3    benzonatate (TESSALON) 100 MG capsule Take 1 capsule (100 mg total) by mouth 2 (two) times daily as needed for cough. 20 capsule 0   busPIRone (BUSPAR) 5 MG tablet Take 1 tablet (5 mg total) by mouth 2 (two) times daily. 60 tablet 0   Cholecalciferol (VITAMIN D-3 PO) Take 2,000 Units by mouth daily.     cyanocobalamin (VITAMIN B12) 1000 MCG/ML injection Inject 1,000 mcg into the muscle every 30 (thirty) days.     docusate sodium (COLACE) 100 MG capsule Take 200 mg by mouth daily as needed (constipation).     DULoxetine (CYMBALTA) 60 MG capsule Take 2 capsules (120 mg total) by mouth daily. 60 capsule 3   ferrous sulfate 324 MG TBEC Take 324 mg by mouth.     guaiFENesin-codeine 100-10 MG/5ML syrup Take 10 mLs by mouth 3 (three) times daily as needed.     hydrocortisone (ANUSOL-HC) 25 MG suppository Place 1 suppository (25 mg total) rectally 2 (two) times daily as needed for hemorrhoids or anal itching. 12 suppository 0   lidocaine-prilocaine (EMLA) cream Apply 30 minutes before appointment 30 g 2   linaclotide (LINZESS) 290 MCG CAPS capsule Take 1 capsule (290 mcg total) by mouth daily before breakfast. (Patient taking differently: Take 290 mcg by mouth daily as needed (Constipation).) 30 capsule 1   losartan (COZAAR) 25 MG tablet Take 25 mg by mouth daily.     methocarbamol (ROBAXIN) 500 MG tablet Take 1 tablet (500 mg total) by mouth every 6 (six) hours as needed for muscle spasms. 60 tablet 0   metoCLOPramide (REGLAN) 5 MG tablet Take 1 tablet by mouth 3 times daily 20-30 minutes before meals NO FURTHER REFILLS UNTIL SEEN, NEEDS AND APPOINTMENT (Patient taking differently: Take 5 mg by mouth 3 (three) times daily before meals.) 90 tablet 0   metoprolol succinate (TOPROL-XL) 25 MG 24 hr tablet Take 1 tablet (25 mg total) by mouth daily. Take with or immediately following a meal. 30 tablet 2   nitroGLYCERIN (NITROSTAT) 0.4 MG SL tablet Place 1 tablet (0.4 mg total) under the tongue every 5 (five)  minutes x 3 doses as needed for chest pain. 25 tablet 1   Oxycodone HCl 10 MG TABS Take 10 mg by mouth 5 (five) times daily as needed (for moderate pain).     pantoprazole (PROTONIX) 40 MG tablet Take 1 tablet (40 mg total) by mouth daily as needed (acid reflux). 30 tablet 2   potassium chloride SA (KLOR-CON M) 20 MEQ tablet Take 1 tablet (20 mEq total) by mouth 2 (two) times daily. 14 tablet 0   prochlorperazine (COMPAZINE) 10 MG tablet Take 1 tablet (10 mg total)  by mouth every 6 (six) hours as needed for nausea or vomiting. 30 tablet 2   rOPINIRole (REQUIP) 1 MG tablet Take 1 tablet (1 mg total) by mouth 3 (three) times daily as directed 270 tablet 1   rosuvastatin (CRESTOR) 10 MG tablet Take 10 mg by mouth at bedtime.     senna (SENOKOT) 8.6 MG tablet Take 17.2 mg by mouth daily as needed for constipation.     Tiotropium Bromide-Olodaterol (STIOLTO RESPIMAT) 2.5-2.5 MCG/ACT AERS Inhale 2 puffs into the lungs daily. 1 each 5   traZODone (DESYREL) 50 MG tablet TAKE 0.5-1 TABLETS (25-50 MG TOTAL) BY MOUTH AT BEDTIME AS NEEDED FOR SLEEP. (Patient taking differently: Take 50 mg by mouth at bedtime.) 90 tablet 1   No current facility-administered medications for this visit.    SURGICAL HISTORY:  Past Surgical History:  Procedure Laterality Date   BACK SURGERY     BIOPSY  08/31/2021   Procedure: BIOPSY;  Surgeon: Tressia Danas, MD;  Location: [facility] ENDOSCOPY;  Service: Gastroenterology;;   BLEPHAROPLASTY  10/10/2017   BRONCHIAL BIOPSY  11/21/2021   Procedure: BRONCHIAL BIOPSIES;  Surgeon: Oretha Milch, MD;  Location: [facility] ENDOSCOPY;  Service: Cardiopulmonary;;   BRONCHIAL BRUSHINGS  11/21/2021   Procedure: BRONCHIAL BRUSHINGS;  Surgeon: Oretha Milch, MD;  Location: [facility] ENDOSCOPY;  Service: Cardiopulmonary;;   BRONCHIAL NEEDLE ASPIRATION BIOPSY  11/21/2021   Procedure: BRONCHIAL NEEDLE ASPIRATION BIOPSIES;  Surgeon: Oretha Milch, MD;  Location: [facility] ENDOSCOPY;  Service: Cardiopulmonary;;    BRONCHIAL WASHINGS  11/21/2021   Procedure: BRONCHIAL WASHINGS;  Surgeon: Oretha Milch, MD;  Location: [facility] ENDOSCOPY;  Service: Cardiopulmonary;;   CATARACT EXTRACTION, BILATERAL     CESAREAN SECTION     CHOLECYSTECTOMY     COLONOSCOPY  10/2016   COLONOSCOPY WITH PROPOFOL N/A 08/31/2021   Procedure: COLONOSCOPY WITH PROPOFOL;  Surgeon: Tressia Danas, MD;  Location: [facility] ENDOSCOPY;  Service: Gastroenterology;  Laterality: N/A;   ESOPHAGOGASTRODUODENOSCOPY (EGD) WITH PROPOFOL N/A 08/31/2021   Procedure: ESOPHAGOGASTRODUODENOSCOPY (EGD) WITH PROPOFOL;  Surgeon: Tressia Danas, MD;  Location: [facility] ENDOSCOPY;  Service: Gastroenterology;  Laterality: N/A;   HAND SURGERY Bilateral    carpal tunnel   HEMORRHOID SURGERY     HEMOSTASIS CONTROL  11/21/2021   Procedure: HEMOSTASIS CONTROL;  Surgeon: Oretha Milch, MD;  Location: [facility] ENDOSCOPY;  Service: Cardiopulmonary;;   INTERSTIM IMPLANT PLACEMENT     IR IMAGING GUIDED PORT INSERTION  01/17/2022   KNEE SURGERY Left    arthroscopy x3   LOWER EXTREMITY ANGIOGRAPHY N/A 07/16/2016   Procedure: Lower Extremity Angiography;  Surgeon: Yates Decamp, MD;  Location: [facility] INVASIVE CV LAB;  Service: Cardiovascular;  Laterality: N/A;   LOWER EXTREMITY INTERVENTION N/A 08/06/2016   Procedure: Lower Extremity Intervention;  Surgeon: Yates Decamp, MD;  Location: [facility] INVASIVE CV LAB;  Service: Cardiovascular;  Laterality: N/A;   NECK SURGERY     OTHER SURGICAL HISTORY  2013   bladder stimulator in back   PERIPHERAL VASCULAR BALLOON ANGIOPLASTY  08/06/2016   Procedure: Peripheral Vascular Balloon Angioplasty;  Surgeon: Yates Decamp, MD;  Location: [facility] INVASIVE CV LAB;  Service: Cardiovascular;;  Right SFA   POLYPECTOMY  08/31/2021   Procedure: POLYPECTOMY;  Surgeon: Tressia Danas, MD;  Location: [facility] ENDOSCOPY;  Service: Gastroenterology;;   TENNIS ELBOW RELEASE/NIRSCHEL PROCEDURE Right 12/2015   TOE SURGERY Right    right foot second and fourth toe   TOTAL HIP ARTHROPLASTY  Left    TOTAL HIP REVISION Left 10/13/2017  Procedure: LEFT TOTAL HIP REVISION ACETABULUM, OPEN REDUCTION INTERNAL WITH BONE GRAFT FIXATION LEFT GREATER TROCHANTERIC FRACTURE;  Surgeon: Gean Birchwood, MD;  Location: [facility] ORS;  Service: Orthopedics;  Laterality: Left;   TOTAL KNEE ARTHROPLASTY Right 01/12/2016   Procedure: RIGHT TOTAL KNEE ARTHROPLASTY;  Surgeon: Beverely Low, MD;  Location: [facility] OR;  Service: Orthopedics;  Laterality: Right;   TUBAL LIGATION     UPPER GI ENDOSCOPY  10/2016   VAGINAL HYSTERECTOMY     VIDEO BRONCHOSCOPY WITH ENDOBRONCHIAL ULTRASOUND N/A 11/21/2021   Procedure: VIDEO BRONCHOSCOPY WITH ENDOBRONCHIAL ULTRASOUND;  Surgeon: Oretha Milch, MD;  Location: [facility] ENDOSCOPY;  Service: Cardiopulmonary;  Laterality: N/A;    REVIEW OF SYSTEMS:  Constitutional: positive for fatigue Eyes: negative Ears, nose, mouth, throat, and face: negative Respiratory: positive for dyspnea on exertion Cardiovascular: negative Gastrointestinal: negative Genitourinary:negative Integument/breast: negative Hematologic/lymphatic: negative Musculoskeletal:positive for arthralgias Neurological: negative Behavioral/Psych: negative Endocrine: negative Allergic/Immunologic: negative   PHYSICAL EXAMINATION: General appearance: alert, cooperative, fatigued, and no distress Head: Normocephalic, without obvious abnormality, atraumatic Neck: no adenopathy, no JVD, supple, symmetrical, trachea midline, and thyroid not enlarged, symmetric, no tenderness/mass/nodules Lymph nodes: Cervical, supraclavicular, and axillary nodes normal. Resp: clear to auscultation bilaterally Back: symmetric, no curvature. ROM normal. No CVA tenderness. Cardio: regular rate and rhythm, S1, S2 normal, no murmur, click, rub or gallop GI: soft, non-tender; bowel sounds normal; no masses,  no organomegaly Extremities: extremities normal, atraumatic, no cyanosis or edema Neurologic: Alert and oriented X 3, normal strength and  tone. Normal symmetric reflexes. Normal coordination and gait  ECOG PERFORMANCE STATUS: 1 - Symptomatic but completely ambulatory  Blood pressure (!) 151/76, pulse 93, temperature 97.8 F (36.6 C), temperature source Oral, resp. rate 16, weight 171 lb 9.6 oz (77.8 kg), SpO2 98 %.  LABORATORY DATA: Lab Results  Component Value Date   WBC 4.0 05/28/2022   HGB 9.4 (L) 05/28/2022   HCT 28.2 (L) 05/28/2022   MCV 98.9 05/28/2022   PLT 143 (L) 05/28/2022      Chemistry      Component Value Date/Time   NA 137 05/28/2022 0856   NA 140 09/28/2019 0948   K 2.8 (L) 05/28/2022 0856   K 3.5 03/19/2011 0722   CL 99 05/28/2022 0856   CO2 31 05/28/2022 0856   BUN 8 05/28/2022 0856   BUN 12 09/28/2019 0948   CREATININE 0.78 05/28/2022 0856      Component Value Date/Time   CALCIUM 8.4 (L) 05/28/2022 0856   ALKPHOS 71 05/28/2022 0856   AST 17 05/28/2022 0856   ALT 10 05/28/2022 0856   BILITOT 0.4 05/28/2022 0856       RADIOGRAPHIC STUDIES: CT Chest W Contrast  Result Date: 05/27/2022 CLINICAL DATA:  Metastatic disease for lung cancer. On chemotherapy previous XRT. Remote history of cervical cancer. * Tracking Code: BO * EXAM: CT CHEST, ABDOMEN, AND PELVIS WITH CONTRAST TECHNIQUE: Multidetector CT imaging of the chest, abdomen and pelvis was performed following the standard protocol during bolus administration of intravenous contrast. RADIATION DOSE REDUCTION: This exam was performed according to the departmental dose-optimization program which includes automated exposure control, adjustment of the mA and/or kV according to patient size and/or use of iterative reconstruction technique. CONTRAST:  OMNIPAQUE IOHEXOL 300 MG/ML  SOLN COMPARISON:  03/15/2022 CT and older. FINDINGS: CT CHEST FINDINGS Cardiovascular: Heart is nonenlarged. No pericardial effusion. Coronary artery calcifications are seen. The thoracic aorta has scattered partially calcified atherosclerotic plaque as well as  extending along the great vessels.  Right IJ chest port. Mediastinum/Nodes: Small thyroid gland. Slightly patulous thoracic esophagus. No specific abnormal lymph node enlargement seen in the axillary region. Previously there were some small nodes in the hilum and mediastinum. These are again seen. Specific foci are followed. Right paratracheal node which previously measured 10 x 7 mm, today on series 2, image 21 measures 9 by 6 mm. Right hilar node that measured 13 x 11 mm on the prior, today on series 2, image 26 measures 12 by 9 mm. Other small nodes are similar to previous. Lungs/Pleura: Once again the lungs demonstrate peripheral bilateral interstitial septal thickening with scarring and fibrotic changes. Again greatest along the lower lung zones. No underlying consolidation, pneumothorax or effusion. Few areas of honeycombing suggested as well. Stable calcified nodule in the middle lobe laterally on series 4, image 84. There are some additional noncalcified nodule seen on the prior which are again evaluated today. These include 3 mm nodule lateral anterior left lower lobe on series 4, image 78, unchanged. Ill-defined right lower lobe nodule measuring 5 mm on series 4, image 77. Superior segment right lower lobe nodule6 x 4 mm on image 63 series 4. Previously 7 x 5 mm. Other lesions are stable. Musculoskeletal: Diffuse degenerative changes along the spine. There is fixation hardware along the lower cervical spine at the edge of the imaging field. Scattered heterogeneous sclerotic areas again identified. CT ABDOMEN PELVIS FINDINGS Hepatobiliary: Previous cholecystectomy. Slight ectasia of the biliary tree. There is a small low-attenuation lesion in the right hepatic lobe on series 2, image 56 measuring 5 mm. Too small to completely characterize. This was seen previously. Simple attention on follow-up. Pancreas: Moderate atrophy of the pancreas. No obvious pancreatic mass. Spleen: Normal in size without focal  abnormality. Adrenals/Urinary Tract: Adrenal glands are unremarkable. Kidneys are normal, without renal calculi, focal lesion, or hydronephrosis. Bladder is unremarkable. Stomach/Bowel: Moderate colonic stool. Left-sided colonic diverticula. Normal appendix. No bowel dilatation including the stomach, small or large bowel. No abnormal enhancement. There is some high density luminal debris along loops of small bowel in the midabdomen. Vascular/Lymphatic: Aortic atherosclerosis. No enlarged abdominal or pelvic lymph nodes. Reproductive: Status post hysterectomy. No adnexal masses. Other: Anasarca.  No free air or free fluid. Musculoskeletal: Scattered degenerative changes seen of the spine and pelvis. Once again there multiple areas of bony sclerosis identified throughout the skeleton. Spinal stimulator along the sacrum with battery pack in the subcutaneous tissues posterior to the left hemipelvis. Streak artifact from left hip arthroplasty. IMPRESSION: No significant interval change. Stable small areas of lung nodularity and some small mediastinal and hilar lymph nodes. Chronic interstitial changes of the lungs with peripheral interstitial septal thickening. Colonic diverticulosis without obstruction, free air or free fluid. Stable diffuse patchy by areas of bony sclerosis. Please correlate with prior workup Electronically Signed   By: Karen Kays M.D.   On: 05/27/2022 14:26   CT Abdomen Pelvis W Contrast  Result Date: 05/27/2022 CLINICAL DATA:  Metastatic disease for lung cancer. On chemotherapy previous XRT. Remote history of cervical cancer. * Tracking Code: BO * EXAM: CT CHEST, ABDOMEN, AND PELVIS WITH CONTRAST TECHNIQUE: Multidetector CT imaging of the chest, abdomen and pelvis was performed following the standard protocol during bolus administration of intravenous contrast. RADIATION DOSE REDUCTION: This exam was performed according to the departmental dose-optimization program which includes automated  exposure control, adjustment of the mA and/or kV according to patient size and/or use of iterative reconstruction technique. CONTRAST:  OMNIPAQUE IOHEXOL 300 MG/ML  SOLN COMPARISON:  03/15/2022 CT and older. FINDINGS: CT CHEST FINDINGS Cardiovascular: Heart is nonenlarged. No pericardial effusion. Coronary artery calcifications are seen. The thoracic aorta has scattered partially calcified atherosclerotic plaque as well as extending along the great vessels. Right IJ chest port. Mediastinum/Nodes: Small thyroid gland. Slightly patulous thoracic esophagus. No specific abnormal lymph node enlargement seen in the axillary region. Previously there were some small nodes in the hilum and mediastinum. These are again seen. Specific foci are followed. Right paratracheal node which previously measured 10 x 7 mm, today on series 2, image 21 measures 9 by 6 mm. Right hilar node that measured 13 x 11 mm on the prior, today on series 2, image 26 measures 12 by 9 mm. Other small nodes are similar to previous. Lungs/Pleura: Once again the lungs demonstrate peripheral bilateral interstitial septal thickening with scarring and fibrotic changes. Again greatest along the lower lung zones. No underlying consolidation, pneumothorax or effusion. Few areas of honeycombing suggested as well. Stable calcified nodule in the middle lobe laterally on series 4, image 84. There are some additional noncalcified nodule seen on the prior which are again evaluated today. These include 3 mm nodule lateral anterior left lower lobe on series 4, image 78, unchanged. Ill-defined right lower lobe nodule measuring 5 mm on series 4, image 77. Superior segment right lower lobe nodule6 x 4 mm on image 63 series 4. Previously 7 x 5 mm. Other lesions are stable. Musculoskeletal: Diffuse degenerative changes along the spine. There is fixation hardware along the lower cervical spine at the edge of the imaging field. Scattered heterogeneous sclerotic areas  again identified. CT ABDOMEN PELVIS FINDINGS Hepatobiliary: Previous cholecystectomy. Slight ectasia of the biliary tree. There is a small low-attenuation lesion in the right hepatic lobe on series 2, image 56 measuring 5 mm. Too small to completely characterize. This was seen previously. Simple attention on follow-up. Pancreas: Moderate atrophy of the pancreas. No obvious pancreatic mass. Spleen: Normal in size without focal abnormality. Adrenals/Urinary Tract: Adrenal glands are unremarkable. Kidneys are normal, without renal calculi, focal lesion, or hydronephrosis. Bladder is unremarkable. Stomach/Bowel: Moderate colonic stool. Left-sided colonic diverticula. Normal appendix. No bowel dilatation including the stomach, small or large bowel. No abnormal enhancement. There is some high density luminal debris along loops of small bowel in the midabdomen. Vascular/Lymphatic: Aortic atherosclerosis. No enlarged abdominal or pelvic lymph nodes. Reproductive: Status post hysterectomy. No adnexal masses. Other: Anasarca.  No free air or free fluid. Musculoskeletal: Scattered degenerative changes seen of the spine and pelvis. Once again there multiple areas of bony sclerosis identified throughout the skeleton. Spinal stimulator along the sacrum with battery pack in the subcutaneous tissues posterior to the left hemipelvis. Streak artifact from left hip arthroplasty. IMPRESSION: No significant interval change. Stable small areas of lung nodularity and some small mediastinal and hilar lymph nodes. Chronic interstitial changes of the lungs with peripheral interstitial septal thickening. Colonic diverticulosis without obstruction, free air or free fluid. Stable diffuse patchy by areas of bony sclerosis. Please correlate with prior workup Electronically Signed   By: Karen Kays M.D.   On: 05/27/2022 14:26    ASSESSMENT AND PLAN: This is a very pleasant 72 years old white female with Extensive stage (T4, N2, M1 C) small cell  lung cancer presented with large right lower lobe consolidative mass in addition to right hilar and mediastinal lymphadenopathy and extensive bone metastasis diagnosed in November 2023.  The patient is currently undergoing systemic chemotherapy with Palliative systemic chemotherapy with carboplatin  for AUC of 5 on day 1, etoposide 100 Mg/M2 on days 1, 2 and 3 with Cosela 240 Mg/M2 on the days of the chemotherapy as well as Imfinzi 1500 mg IV on day 1 every 3 weeks during the induction phase and then every 4 weeks during the maintenance if there is no evidence for disease progression.  Status post 6 cycles.  Starting from cycle #5 she will be on maintenance treatment with single agent Imfinzi 1500 Mg IV every 4 weeks.  Starting from cycle #5 she is on maintenance treatment with Imfinzi 1500 Mg IV every 4 weeks. The patient has been tolerating this treatment well with no concerning adverse effect except for mild fatigue. She had repeat CT scan of the chest, abdomen and pelvis performed recently.  I personally and independently reviewed the scan and discussed the result with the patient today. Her scan showed no concerning findings for disease recurrence or metastasis. I recommended for her to continue her current maintenance treatment with Imfinzi and she will proceed with cycle #7 today. The patient will come back for follow-up visit in 4 weeks for evaluation before the next cycle of her treatment. The patient will continue on the ferrous sulfate for the anemia. For the hypokalemia, I will give the patient potassium chloride in the clinic in addition to oral potassium chloride at home. She was advised to call immediately if she has any other concerning symptoms in the interval.  The patient voices understanding of current disease status and treatment options and is in agreement with the current care plan.  All questions were answered. The patient knows to call the clinic with any problems, questions or  concerns. We can certainly see the patient much sooner if necessary.  The total time spent in the appointment was 30 minutes.  Disclaimer: This note was dictated with voice recognition software. Similar sounding words can inadvertently be transcribed and may not be corrected upon review.

## 2022-05-28 NOTE — Progress Notes (Signed)
Patient seen by Dr. Gypsy Balsam are within treatment parameters.  Labs reviewed: and are within treatment parameters.  Per physician team, patient is ready for treatment and there are NO modifications to the treatment plan.except Dr. Arbutus Ped is adding an order for  IV K+ .Marland Kitchen

## 2022-05-28 NOTE — Patient Instructions (Signed)
[facility]  Discharge Instructions: Thank you for choosing [facility] to provide your oncology and hematology care.   If you have a lab appointment with the Cancer Center, please go directly to the Cancer Center and check in at the registration area.   Wear comfortable clothing and clothing appropriate for easy access to any Portacath or PICC line.   We strive to give you quality time with your provider. You may need to reschedule your appointment if you arrive late (15 or more minutes).  Arriving late affects you and other patients whose appointments are after yours.  Also, if you miss three or more appointments without notifying the office, you may be dismissed from the clinic at the provider's discretion.      For prescription refill requests, have your pharmacy contact our office and allow 72 hours for refills to be completed.    Today you received the following chemotherapy and/or immunotherapy agents: durvalumab        To help prevent nausea and vomiting after your treatment, we encourage you to take your nausea medication as directed.  BELOW ARE SYMPTOMS THAT SHOULD BE REPORTED IMMEDIATELY: *FEVER GREATER THAN 100.4 F (38 C) OR HIGHER *CHILLS OR SWEATING *NAUSEA AND VOMITING THAT IS NOT CONTROLLED WITH YOUR NAUSEA MEDICATION *UNUSUAL SHORTNESS OF BREATH *UNUSUAL BRUISING OR BLEEDING *URINARY PROBLEMS (pain or burning when urinating, or frequent urination) *BOWEL PROBLEMS (unusual diarrhea, constipation, pain near the anus) TENDERNESS IN MOUTH AND THROAT WITH OR WITHOUT PRESENCE OF ULCERS (sore throat, sores in mouth, or a toothache) UNUSUAL RASH, SWELLING OR PAIN  UNUSUAL VAGINAL DISCHARGE OR ITCHING   Items with * indicate a potential emergency and should be followed up as soon as possible or go to the Emergency Department if any problems should occur.  Please show the CHEMOTHERAPY ALERT CARD or IMMUNOTHERAPY ALERT CARD at  check-in to the Emergency Department and triage nurse.  Should you have questions after your visit or need to cancel or reschedule your appointment, please contact [facility]  Dept: [phone]  and follow the prompts.  Office hours are 8:00 a.m. to 4:30 p.m. Monday - Friday. Please note that voicemails left after 4:00 p.m. may not be returned until the following business day.  We are closed weekends and major holidays. You have access to a nurse at all times for urgent questions. Please call the main number to the clinic Dept: [phone] and follow the prompts.   For any non-urgent questions, you may also contact your provider using MyChart. We now offer e-Visits for anyone 18 and older to request care online for non-urgent symptoms. For details visit [email or web address].   Also download the MyChart app! Go to the app store, search "MyChart", open the app, select , and log in with your MyChart username and password.   

## 2022-05-29 ENCOUNTER — Other Ambulatory Visit (HOSPITAL_COMMUNITY): Payer: Self-pay

## 2022-05-29 ENCOUNTER — Other Ambulatory Visit: Payer: Self-pay

## 2022-05-29 MED ORDER — OXYCODONE HCL 10 MG PO TABS
10.0000 mg | ORAL_TABLET | ORAL | 0 refills | Status: DC | PRN
  Filled 2022-05-29: qty 180, 30d supply, fill #0

## 2022-05-30 ENCOUNTER — Other Ambulatory Visit: Payer: Self-pay

## 2022-06-06 ENCOUNTER — Other Ambulatory Visit (HOSPITAL_COMMUNITY): Payer: Self-pay

## 2022-06-12 ENCOUNTER — Telehealth: Payer: Self-pay | Admitting: Physician Assistant

## 2022-06-12 NOTE — Telephone Encounter (Signed)
Rescheduled 06/12 appointment to 06/11, called patient and left a voicemail.

## 2022-06-21 NOTE — Progress Notes (Deleted)
[facility] OFFICE PROGRESS NOTE  [name], NP [address] [place] [address]  DIAGNOSIS: Extensive stage (T4, N2, M1 C) small cell lung cancer presented with large right lower lobe consolidative mass in addition to right hilar and mediastinal lymphadenopathy and extensive bone metastasis diagnosed in November 2023.   PRIOR THERAPY: None  CURRENT THERAPY: Palliative systemic chemotherapy with carboplatin for AUC of 5 on day 1, etoposide 100 Mg/M2 on days 1, 2 and 3 with Cosela 240 Mg/M2 on the days of the chemotherapy as well as Imfinzi 1500 mg IV on day 1 every 3 weeks during the induction phase and then every 4 weeks during the maintenance if there is no evidence for disease progression.  Status post 7 cycles.  Starting from cycle #5 she will be on maintenance treatment with Imfinzi 1500 Mg IV every 4 weeks.   INTERVAL HISTORY: [name] 72 y.o. female returns to the clinic today for follow-up visit.  The patient was last seen by Dr. Arbutus Ped on 05/28/2022.  The patient is currently undergoing treatment and maintenance immunotherapy with Imfinzi 1500 mg IV every 4 weeks.  She tolerates this well without any concerning adverse side effects.  Today she denies any fever or chills.  She denies any chest pain or hemoptysis.  She reports baseline dyspnea on exertion and chronic occasional cough.  Denies any nausea, vomiting, diarrhea, or constipation.  Previously had nausea and reflux?  Compazine and Protonix?  She is followed by Dr. Vear Clock in the pain clinic and is currently on Cymbalta and oxycodone every 4 hours.  She denies any rashes or skin changes.  She denies any headache or visual changes.  She is here today for evaluation repeat blood work before undergoing cycle #8.  MEDICAL HISTORY: Past Medical History:  Diagnosis Date   Anxiety    Aortic atherosclerosis (HCC)    Bilateral cataracts    Carpal tunnel syndrome    Cervical cancer (HCC)    Chronic back  pain    Claudication (HCC)    COPD (chronic obstructive pulmonary disease) (HCC)    wheezing   DDD (degenerative disc disease), cervical    DDD (degenerative disc disease), lumbar    Depression    Diverticulosis    Dyspnea    on exertion   Gastroparesis    GERD (gastroesophageal reflux disease)    Hearing loss    Bilateral   History of blood transfusion    History of colon polyps    History of migraine    History of radiation therapy    Left scapula,left chest, right pelvis- 12/20/21-01/02/22- Dr. 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  MEDICATIONS:  Current Outpatient Medications  Medication Sig Dispense Refill   acetaminophen (TYLENOL) 325 MG tablet Take 650 mg by mouth 2 (two) times daily as needed for headache.     albuterol (PROVENTIL) (2.5 MG/3ML) 0.083% nebulizer solution Inhale 3 mLs into the lungs every 4 (four) hours as needed for wheezing or shortness of breath (cough). (Patient taking differently: Inhale 3 mLs into the lungs 2 (two) times daily as needed for wheezing or shortness of breath (cough).) 75 mL 12   albuterol (VENTOLIN HFA) 108 (90 Base) MCG/ACT inhaler Inhale 2 puffs into the lungs 2 (two) times daily as needed for wheezing or  shortness of breath.     aspirin EC (ASPIRIN 81) 81 MG tablet Take 1 tablet (81 mg total) by mouth daily. 90 tablet 3   busPIRone (BUSPAR) 5 MG tablet Take 1 tablet (5 mg total) by mouth 2 (two) times daily. 60 tablet 0   Cholecalciferol (VITAMIN D-3 PO) Take 2,000 Units by mouth daily.     cyanocobalamin (VITAMIN B12) 1000 MCG/ML injection Inject 1,000 mcg into the muscle every 30 (thirty) days.     docusate sodium (COLACE) 100 MG capsule Take 200 mg by mouth daily as needed (constipation).     DULoxetine (CYMBALTA) 60 MG capsule Take 2 capsules (120 mg total) by mouth daily. 60 capsule 3   hydrocortisone (ANUSOL-HC) 25 MG suppository Place 1 suppository (25 mg total) rectally 2 (two) times daily as needed for hemorrhoids or anal itching. (Patient not taking: Reported on 05/28/2022) 12 suppository 0   lidocaine-prilocaine (EMLA) cream Apply 30 minutes before appointment 30 g 2   linaclotide (LINZESS) 290 MCG CAPS capsule Take 1 capsule (290 mcg total) by mouth daily before breakfast. (Patient taking differently: Take 290 mcg by mouth daily as needed (Constipation).) 30 capsule 1   losartan (COZAAR) 25 MG tablet Take 25 mg by mouth daily.     methocarbamol (ROBAXIN) 500 MG tablet Take 1,000 mg by mouth 2 (two) times daily.     metoCLOPramide (REGLAN) 5 MG tablet Take 1 tablet by mouth 3 times daily 20-30 minutes before meals NO FURTHER REFILLS UNTIL SEEN, NEEDS AND APPOINTMENT (Patient taking differently: Take 5 mg by mouth 3 (three) times daily before meals.) 90 tablet 0   metoprolol succinate (TOPROL-XL) 25 MG 24 hr tablet Take 1 tablet (25 mg total) by mouth daily. Take with or immediately following a meal. 30 tablet 2   nitroGLYCERIN (NITROSTAT) 0.4 MG SL tablet Place 1 tablet (0.4 mg total) under the tongue every 5 (five) minutes x 3 doses as needed for chest pain. (Patient not taking: Reported on 05/28/2022) 25 tablet 1   Oxycodone HCl 10 MG TABS Take 10 mg by mouth 5 (five) times daily as needed  (for moderate pain).     Oxycodone HCl 10 MG TABS Take 1 tablet (10 mg total) by mouth every 4 (four) hours as needed for pain 180 tablet 0   pantoprazole (PROTONIX) 40 MG tablet Take 1 tablet (40 mg total) by mouth daily as needed (acid reflux). 30 tablet 2   potassium chloride SA (KLOR-CON M) 20 MEQ tablet Take 1 tablet (20 mEq total) by mouth 2 (two) times daily. 14 tablet 0   potassium chloride SA (KLOR-CON M) 20 MEQ tablet Take 1 tablet (20 mEq total) by mouth 2 (two) times daily. 14 tablet 0  prochlorperazine (COMPAZINE) 10 MG tablet Take 1 tablet (10 mg total) by mouth every 6 (six) hours as needed for nausea or vomiting. (Patient not taking: Reported on 05/28/2022) 30 tablet 2   rOPINIRole (REQUIP) 1 MG tablet Take 1 tablet (1 mg total) by mouth 3 (three) times daily as directed 270 tablet 1   rosuvastatin (CRESTOR) 10 MG tablet Take 10 mg by mouth at bedtime.     senna (SENOKOT) 8.6 MG tablet Take 17.2 mg by mouth daily as needed for constipation.     Tiotropium Bromide-Olodaterol (STIOLTO RESPIMAT) 2.5-2.5 MCG/ACT AERS Inhale 2 puffs into the lungs daily. 1 each 5   traZODone (DESYREL) 50 MG tablet TAKE 0.5-1 TABLETS (25-50 MG TOTAL) BY MOUTH AT BEDTIME AS NEEDED FOR SLEEP. (Patient taking differently: Take 50 mg by mouth at bedtime.) 90 tablet 1   No current facility-administered medications for this visit.    SURGICAL HISTORY:  Past Surgical History:  Procedure Laterality Date   BACK SURGERY     BIOPSY  08/31/2021   Procedure: BIOPSY;  Surgeon: Tressia Danas, MD;  Location: [facility] ENDOSCOPY;  Service: Gastroenterology;;   BLEPHAROPLASTY  10/10/2017   BRONCHIAL BIOPSY  11/21/2021   Procedure: BRONCHIAL BIOPSIES;  Surgeon: Oretha Milch, MD;  Location: [facility] ENDOSCOPY;  Service: Cardiopulmonary;;   BRONCHIAL BRUSHINGS  11/21/2021   Procedure: BRONCHIAL BRUSHINGS;  Surgeon: Oretha Milch, MD;  Location: [facility] ENDOSCOPY;  Service: Cardiopulmonary;;   BRONCHIAL NEEDLE ASPIRATION BIOPSY   11/21/2021   Procedure: BRONCHIAL NEEDLE ASPIRATION BIOPSIES;  Surgeon: Oretha Milch, MD;  Location: [facility] ENDOSCOPY;  Service: Cardiopulmonary;;   BRONCHIAL WASHINGS  11/21/2021   Procedure: BRONCHIAL WASHINGS;  Surgeon: Oretha Milch, MD;  Location: [facility] ENDOSCOPY;  Service: Cardiopulmonary;;   CATARACT EXTRACTION, BILATERAL     CESAREAN SECTION     CHOLECYSTECTOMY     COLONOSCOPY  10/2016   COLONOSCOPY WITH PROPOFOL N/A 08/31/2021   Procedure: COLONOSCOPY WITH PROPOFOL;  Surgeon: Tressia Danas, MD;  Location: [facility] ENDOSCOPY;  Service: Gastroenterology;  Laterality: N/A;   ESOPHAGOGASTRODUODENOSCOPY (EGD) WITH PROPOFOL N/A 08/31/2021   Procedure: ESOPHAGOGASTRODUODENOSCOPY (EGD) WITH PROPOFOL;  Surgeon: Tressia Danas, MD;  Location: [facility] ENDOSCOPY;  Service: Gastroenterology;  Laterality: N/A;   HAND SURGERY Bilateral    carpal tunnel   HEMORRHOID SURGERY     HEMOSTASIS CONTROL  11/21/2021   Procedure: HEMOSTASIS CONTROL;  Surgeon: Oretha Milch, MD;  Location: [facility] ENDOSCOPY;  Service: Cardiopulmonary;;   INTERSTIM IMPLANT PLACEMENT     IR IMAGING GUIDED PORT INSERTION  01/17/2022   KNEE SURGERY Left    arthroscopy x3   LOWER EXTREMITY ANGIOGRAPHY N/A 07/16/2016   Procedure: Lower Extremity Angiography;  Surgeon: Yates Decamp, MD;  Location: [facility] INVASIVE CV LAB;  Service: Cardiovascular;  Laterality: N/A;   LOWER EXTREMITY INTERVENTION N/A 08/06/2016   Procedure: Lower Extremity Intervention;  Surgeon: Yates Decamp, MD;  Location: [facility] INVASIVE CV LAB;  Service: Cardiovascular;  Laterality: N/A;   NECK SURGERY     OTHER SURGICAL HISTORY  2013   bladder stimulator in back   PERIPHERAL VASCULAR BALLOON ANGIOPLASTY  08/06/2016   Procedure: Peripheral Vascular Balloon Angioplasty;  Surgeon: Yates Decamp, MD;  Location: [facility] INVASIVE CV LAB;  Service: Cardiovascular;;  Right SFA   POLYPECTOMY  08/31/2021   Procedure: POLYPECTOMY;  Surgeon: Tressia Danas, MD;  Location: [facility] ENDOSCOPY;  Service:  Gastroenterology;;   TENNIS ELBOW RELEASE/NIRSCHEL PROCEDURE Right 12/2015   TOE SURGERY Right    right foot second and fourth  toe   TOTAL HIP ARTHROPLASTY Left    TOTAL HIP REVISION Left 10/13/2017   Procedure: LEFT TOTAL HIP REVISION ACETABULUM, OPEN REDUCTION INTERNAL WITH BONE GRAFT FIXATION LEFT GREATER TROCHANTERIC FRACTURE;  Surgeon: Gean Birchwood, MD;  Location: [facility] ORS;  Service: Orthopedics;  Laterality: Left;   TOTAL KNEE ARTHROPLASTY Right 01/12/2016   Procedure: RIGHT TOTAL KNEE ARTHROPLASTY;  Surgeon: Beverely Low, MD;  Location: [facility] OR;  Service: Orthopedics;  Laterality: Right;   TUBAL LIGATION     UPPER GI ENDOSCOPY  10/2016   VAGINAL HYSTERECTOMY     VIDEO BRONCHOSCOPY WITH ENDOBRONCHIAL ULTRASOUND N/A 11/21/2021   Procedure: VIDEO BRONCHOSCOPY WITH ENDOBRONCHIAL ULTRASOUND;  Surgeon: Oretha Milch, MD;  Location: [facility] ENDOSCOPY;  Service: Cardiopulmonary;  Laterality: N/A;    REVIEW OF SYSTEMS:   Review of Systems  Constitutional: Negative for appetite change, chills, fatigue, fever and unexpected weight change.  HENT:   Negative for mouth sores, nosebleeds, sore throat and trouble swallowing.   Eyes: Negative for eye problems and icterus.  Respiratory: Negative for cough, hemoptysis, shortness of breath and wheezing.   Cardiovascular: Negative for chest pain and leg swelling.  Gastrointestinal: Negative for abdominal pain, constipation, diarrhea, nausea and vomiting.  Genitourinary: Negative for bladder incontinence, difficulty urinating, dysuria, frequency and hematuria.   Musculoskeletal: Negative for back pain, gait problem, neck pain and neck stiffness.  Skin: Negative for itching and rash.  Neurological: Negative for dizziness, extremity weakness, gait problem, headaches, light-headedness and seizures.  Hematological: Negative for adenopathy. Does not bruise/bleed easily.  Psychiatric/Behavioral: Negative for confusion, depression and sleep disturbance. The patient is  not nervous/anxious.     PHYSICAL EXAMINATION:  There were no vitals taken for this visit.  ECOG PERFORMANCE STATUS: {CHL ONC ECOG [id number]  Physical Exam  Constitutional: Oriented to person, place, and time and well-developed, well-nourished, and in no distress. No distress.  HENT:  Head: Normocephalic and atraumatic.  Mouth/Throat: Oropharynx is clear and moist. No oropharyngeal exudate.  Eyes: Conjunctivae are normal. Right eye exhibits no discharge. Left eye exhibits no discharge. No scleral icterus.  Neck: Normal range of motion. Neck supple.  Cardiovascular: Normal rate, regular rhythm, normal heart sounds and intact distal pulses.   Pulmonary/Chest: Effort normal and breath sounds normal. No respiratory distress. No wheezes. No rales.  Abdominal: Soft. Bowel sounds are normal. Exhibits no distension and no mass. There is no tenderness.  Musculoskeletal: Normal range of motion. Exhibits no edema.  Lymphadenopathy:    No cervical adenopathy.  Neurological: Alert and oriented to person, place, and time. Exhibits normal muscle tone. Gait normal. Coordination normal.  Skin: Skin is warm and dry. No rash noted. Not diaphoretic. No erythema. No pallor.  Psychiatric: Mood, memory and judgment normal.  Vitals reviewed.  LABORATORY DATA: Lab Results  Component Value Date   WBC 4.0 05/28/2022   HGB 9.4 (L) 05/28/2022   HCT 28.2 (L) 05/28/2022   MCV 98.9 05/28/2022   PLT 143 (L) 05/28/2022      Chemistry      Component Value Date/Time   NA 137 05/28/2022 0856   NA 140 09/28/2019 0948   K 2.8 (L) 05/28/2022 0856   K 3.5 03/19/2011 0722   CL 99 05/28/2022 0856   CO2 31 05/28/2022 0856   BUN 8 05/28/2022 0856   BUN 12 09/28/2019 0948   CREATININE 0.78 05/28/2022 0856      Component Value Date/Time   CALCIUM 8.4 (L) 05/28/2022 0856   ALKPHOS 71 05/28/2022 0856  AST 17 05/28/2022 0856   ALT 10 05/28/2022 0856   BILITOT 0.4 05/28/2022 0856       RADIOGRAPHIC  STUDIES:  CT Chest W Contrast  Result Date: 05/27/2022 CLINICAL DATA:  Metastatic disease for lung cancer. On chemotherapy previous XRT. Remote history of cervical cancer. * Tracking Code: BO * EXAM: CT CHEST, ABDOMEN, AND PELVIS WITH CONTRAST TECHNIQUE: Multidetector CT imaging of the chest, abdomen and pelvis was performed following the standard protocol during bolus administration of intravenous contrast. RADIATION DOSE REDUCTION: This exam was performed according to the departmental dose-optimization program which includes automated exposure control, adjustment of the mA and/or kV according to patient size and/or use of iterative reconstruction technique. CONTRAST:  OMNIPAQUE IOHEXOL 300 MG/ML  SOLN COMPARISON:  03/15/2022 CT and older. FINDINGS: CT CHEST FINDINGS Cardiovascular: Heart is nonenlarged. No pericardial effusion. Coronary artery calcifications are seen. The thoracic aorta has scattered partially calcified atherosclerotic plaque as well as extending along the great vessels. Right IJ chest port. Mediastinum/Nodes: Small thyroid gland. Slightly patulous thoracic esophagus. No specific abnormal lymph node enlargement seen in the axillary region. Previously there were some small nodes in the hilum and mediastinum. These are again seen. Specific foci are followed. Right paratracheal node which previously measured 10 x 7 mm, today on series 2, image 21 measures 9 by 6 mm. Right hilar node that measured 13 x 11 mm on the prior, today on series 2, image 26 measures 12 by 9 mm. Other small nodes are similar to previous. Lungs/Pleura: Once again the lungs demonstrate peripheral bilateral interstitial septal thickening with scarring and fibrotic changes. Again greatest along the lower lung zones. No underlying consolidation, pneumothorax or effusion. Few areas of honeycombing suggested as well. Stable calcified nodule in the middle lobe laterally on series 4, image 84. There are some additional  noncalcified nodule seen on the prior which are again evaluated today. These include 3 mm nodule lateral anterior left lower lobe on series 4, image 78, unchanged. Ill-defined right lower lobe nodule measuring 5 mm on series 4, image 77. Superior segment right lower lobe nodule6 x 4 mm on image 63 series 4. Previously 7 x 5 mm. Other lesions are stable. Musculoskeletal: Diffuse degenerative changes along the spine. There is fixation hardware along the lower cervical spine at the edge of the imaging field. Scattered heterogeneous sclerotic areas again identified. CT ABDOMEN PELVIS FINDINGS Hepatobiliary: Previous cholecystectomy. Slight ectasia of the biliary tree. There is a small low-attenuation lesion in the right hepatic lobe on series 2, image 56 measuring 5 mm. Too small to completely characterize. This was seen previously. Simple attention on follow-up. Pancreas: Moderate atrophy of the pancreas. No obvious pancreatic mass. Spleen: Normal in size without focal abnormality. Adrenals/Urinary Tract: Adrenal glands are unremarkable. Kidneys are normal, without renal calculi, focal lesion, or hydronephrosis. Bladder is unremarkable. Stomach/Bowel: Moderate colonic stool. Left-sided colonic diverticula. Normal appendix. No bowel dilatation including the stomach, small or large bowel. No abnormal enhancement. There is some high density luminal debris along loops of small bowel in the midabdomen. Vascular/Lymphatic: Aortic atherosclerosis. No enlarged abdominal or pelvic lymph nodes. Reproductive: Status post hysterectomy. No adnexal masses. Other: Anasarca.  No free air or free fluid. Musculoskeletal: Scattered degenerative changes seen of the spine and pelvis. Once again there multiple areas of bony sclerosis identified throughout the skeleton. Spinal stimulator along the sacrum with battery pack in the subcutaneous tissues posterior to the left hemipelvis. Streak artifact from left hip arthroplasty. IMPRESSION: No  significant  interval change. Stable small areas of lung nodularity and some small mediastinal and hilar lymph nodes. Chronic interstitial changes of the lungs with peripheral interstitial septal thickening. Colonic diverticulosis without obstruction, free air or free fluid. Stable diffuse patchy by areas of bony sclerosis. Please correlate with prior workup Electronically Signed   By: Karen Kays M.D.   On: 05/27/2022 14:26   CT Abdomen Pelvis W Contrast  Result Date: 05/27/2022 CLINICAL DATA:  Metastatic disease for lung cancer. On chemotherapy previous XRT. Remote history of cervical cancer. * Tracking Code: BO * EXAM: CT CHEST, ABDOMEN, AND PELVIS WITH CONTRAST TECHNIQUE: Multidetector CT imaging of the chest, abdomen and pelvis was performed following the standard protocol during bolus administration of intravenous contrast. RADIATION DOSE REDUCTION: This exam was performed according to the departmental dose-optimization program which includes automated exposure control, adjustment of the mA and/or kV according to patient size and/or use of iterative reconstruction technique. CONTRAST:  OMNIPAQUE IOHEXOL 300 MG/ML  SOLN COMPARISON:  03/15/2022 CT and older. FINDINGS: CT CHEST FINDINGS Cardiovascular: Heart is nonenlarged. No pericardial effusion. Coronary artery calcifications are seen. The thoracic aorta has scattered partially calcified atherosclerotic plaque as well as extending along the great vessels. Right IJ chest port. Mediastinum/Nodes: Small thyroid gland. Slightly patulous thoracic esophagus. No specific abnormal lymph node enlargement seen in the axillary region. Previously there were some small nodes in the hilum and mediastinum. These are again seen. Specific foci are followed. Right paratracheal node which previously measured 10 x 7 mm, today on series 2, image 21 measures 9 by 6 mm. Right hilar node that measured 13 x 11 mm on the prior, today on series 2, image 26 measures 12 by 9 mm.  Other small nodes are similar to previous. Lungs/Pleura: Once again the lungs demonstrate peripheral bilateral interstitial septal thickening with scarring and fibrotic changes. Again greatest along the lower lung zones. No underlying consolidation, pneumothorax or effusion. Few areas of honeycombing suggested as well. Stable calcified nodule in the middle lobe laterally on series 4, image 84. There are some additional noncalcified nodule seen on the prior which are again evaluated today. These include 3 mm nodule lateral anterior left lower lobe on series 4, image 78, unchanged. Ill-defined right lower lobe nodule measuring 5 mm on series 4, image 77. Superior segment right lower lobe nodule6 x 4 mm on image 63 series 4. Previously 7 x 5 mm. Other lesions are stable. Musculoskeletal: Diffuse degenerative changes along the spine. There is fixation hardware along the lower cervical spine at the edge of the imaging field. Scattered heterogeneous sclerotic areas again identified. CT ABDOMEN PELVIS FINDINGS Hepatobiliary: Previous cholecystectomy. Slight ectasia of the biliary tree. There is a small low-attenuation lesion in the right hepatic lobe on series 2, image 56 measuring 5 mm. Too small to completely characterize. This was seen previously. Simple attention on follow-up. Pancreas: Moderate atrophy of the pancreas. No obvious pancreatic mass. Spleen: Normal in size without focal abnormality. Adrenals/Urinary Tract: Adrenal glands are unremarkable. Kidneys are normal, without renal calculi, focal lesion, or hydronephrosis. Bladder is unremarkable. Stomach/Bowel: Moderate colonic stool. Left-sided colonic diverticula. Normal appendix. No bowel dilatation including the stomach, small or large bowel. No abnormal enhancement. There is some high density luminal debris along loops of small bowel in the midabdomen. Vascular/Lymphatic: Aortic atherosclerosis. No enlarged abdominal or pelvic lymph nodes. Reproductive:  Status post hysterectomy. No adnexal masses. Other: Anasarca.  No free air or free fluid. Musculoskeletal: Scattered degenerative changes seen of the spine  and pelvis. Once again there multiple areas of bony sclerosis identified throughout the skeleton. Spinal stimulator along the sacrum with battery pack in the subcutaneous tissues posterior to the left hemipelvis. Streak artifact from left hip arthroplasty. IMPRESSION: No significant interval change. Stable small areas of lung nodularity and some small mediastinal and hilar lymph nodes. Chronic interstitial changes of the lungs with peripheral interstitial septal thickening. Colonic diverticulosis without obstruction, free air or free fluid. Stable diffuse patchy by areas of bony sclerosis. Please correlate with prior workup Electronically Signed   By: Karen Kays M.D.   On: 05/27/2022 14:26     ASSESSMENT/PLAN:  This is a very pleasant 72 year old Caucasian female diagnosed with extensive stage (T4, N2, M1 C) small cell lung cancer.  The patient presented with a large right lower lobe consolidative mass in addition to right hilar mediastinal lymphadenopathy and extensive bony metastases.  The patient was diagnosed in November 2023.   Patient completed palliative radiotherapy to the left scapular region and right hip under the care of Dr. Roselind Messier which was completed on 01/02/2022.   The patient is currently undergoing palliative systemic chemotherapy with carboplatin for an AUC of 5 on day 1, etoposide 100 mg/m on days 1, 2, and 3 with Cosela 240 mg as well as Imfinzi 1500 mg on day 1.  The patient receives treatment IV every 3 weeks.  She is status post 7 cycles.  She does tolerate treatment well although she had cytopenias on labs which require supportive measures such as blood transfusions. Starting from cycle #5, she started maintenance immunotherapy with Imfinzi every 4 weeks.   Labs were reviewed. Recommend that she proceed with cycle #8 today as  scheduled.   We will see him back for a follow up visit in 4 weeks for evaluation and repeat blood work before starting cycle #9   Scan 2 or 3.   The patient was advised to call immediately if she has any concerning symptoms in the interval. The patient voices understanding of current disease status and treatment options and is in agreement with the current care plan. All questions were answered. The patient knows to call the clinic with any problems, questions or concerns. We can certainly see the patient much sooner if necessary          No orders of the defined types were placed in this encounter.    I spent {CHL ONC TIME VISIT - ZOXWR:[id number]} counseling the patient face to face. The total time spent in the appointment was {CHL ONC TIME VISIT - BJYNW:[id number]}.  Dawaun Brancato L Hawley Pavia, PA-C 06/21/22

## 2022-06-25 ENCOUNTER — Telehealth: Payer: Self-pay

## 2022-06-25 ENCOUNTER — Telehealth: Payer: Self-pay | Admitting: Internal Medicine

## 2022-06-25 ENCOUNTER — Inpatient Hospital Stay: Payer: Medicare Other

## 2022-06-25 ENCOUNTER — Inpatient Hospital Stay: Payer: Medicare Other | Admitting: Physician Assistant

## 2022-06-25 ENCOUNTER — Other Ambulatory Visit: Payer: Self-pay

## 2022-06-25 NOTE — Telephone Encounter (Signed)
Rescheduled 06/11 appointments, patient is notified.

## 2022-06-25 NOTE — Progress Notes (Unsigned)
[facility] OFFICE PROGRESS NOTE  [name], NP [address] [place] [address]  DIAGNOSIS: Extensive stage (T4, N2, M1 C) small cell lung cancer presented with large right lower lobe consolidative mass in addition to right hilar and mediastinal lymphadenopathy and extensive bone metastasis diagnosed in November 2023.   PRIOR THERAPY: None  CURRENT THERAPY: Palliative systemic chemotherapy with carboplatin for AUC of 5 on day 1, etoposide 100 Mg/M2 on days 1, 2 and 3 with Cosela 240 Mg/M2 on the days of the chemotherapy as well as Imfinzi 1500 mg IV on day 1 every 3 weeks during the induction phase and then every 4 weeks during the maintenance if there is no evidence for disease progression.  Status post 7 cycles.  Starting from cycle #5 she will be on maintenance treatment with Imfinzi 1500 Mg IV every 4 weeks.   INTERVAL HISTORY: [name] 72 y.o. female returns to the clinic today for follow-up visit accompanied by her daughter.  The patient was last seen by Dr. Arbutus Ped on 05/28/2022.  The patient is currently undergoing treatment and maintenance immunotherapy with Imfinzi 1500 mg IV every 4 weeks.  She tolerates this well without any concerning adverse side effects except ongoing fatigue and generalized weakness.   The patient is not very active at home.  She reportedly sleeps a lot.  She is on some medications causing drowsiness such as pain medication and muscle relaxers.  She is also been taking CBD Gummies for her appetite.  The patient's daughter also mention she has been more forgetful.  The patient denies any signs of infection like fevers, chills, burning with urination, skin infections, diarrhea, or upper respiratory infections.  The patient's parent had dementia.  She did have 1 episode of a fall after losing her balance getting in and out of bed in the middle of the night to use the restroom which she attributed to being groggy.  The patient struggles with  chronic constipation.  She is taking a stool softener once a day.  The patient also has been losing weight and she is scheduled to see member the nutritionist team on the infusion room today.  She does not like the taste of boost and Ensure.  The patient is wondering if she can take vitamin B12.   Today she denies any fever or chills.  She denies any chest pain or hemoptysis.  She reports baseline dyspnea on exertion and chronic occasional cough.  He sometimes feels nauseated without any vomiting. She is followed by Dr. Vear Clock in the pain clinic and is currently on Cymbalta and oxycodone every 4 hours.  She denies any rashes or skin changes.  She denies any headache or visual changes.  She is here today for evaluation repeat blood work before undergoing cycle #8.  MEDICAL HISTORY: Past Medical History:  Diagnosis Date   Anxiety    Aortic atherosclerosis (HCC)    Bilateral cataracts    Carpal tunnel syndrome    Cervical cancer (HCC)    Chronic back pain    Claudication (HCC)    COPD (chronic obstructive pulmonary disease) (HCC)    wheezing   DDD (degenerative disc disease), cervical    DDD (degenerative disc disease), lumbar    Depression    Diverticulosis    Dyspnea    on exertion   Gastroparesis    GERD (gastroesophageal reflux disease)    Hearing loss    Bilateral   History of blood transfusion  History of colon polyps    History of migraine    History of radiation therapy    Left scapula,left chest, right pelvis- 12/20/21-01/02/22- Dr. 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  MEDICATIONS:  Current Outpatient Medications  Medication Sig Dispense Refill   ondansetron (ZOFRAN) 8 MG tablet Take 1 tablet (8 mg total) by mouth every 8 (eight) hours as needed for nausea or vomiting. 30 tablet 2   acetaminophen (TYLENOL) 325 MG tablet Take 650 mg by mouth 2 (two) times daily as needed for headache.     albuterol (PROVENTIL) (2.5 MG/3ML) 0.083% nebulizer solution Inhale 3 mLs into the lungs every 4 (four) hours as needed for wheezing or shortness of breath (cough). (Patient taking differently: Inhale 3 mLs into the lungs 2 (two) times daily as needed for wheezing or shortness of breath (cough).) 75 mL 12   albuterol (VENTOLIN HFA) 108 (90 Base) MCG/ACT inhaler Inhale 2 puffs into the lungs 2 (two) times daily as needed for wheezing or shortness of breath.     aspirin EC (ASPIRIN 81) 81 MG tablet Take 1 tablet (81 mg total) by mouth daily. 90 tablet 3   busPIRone (BUSPAR) 5 MG tablet Take 1 tablet (5 mg total) by mouth 2 (two) times daily. 60 tablet 0   Cholecalciferol (VITAMIN D-3 PO) Take 2,000 Units by mouth daily.     cyanocobalamin (VITAMIN B12) 1000 MCG/ML injection Inject 1,000 mcg into the muscle every 30 (thirty) days.     docusate sodium (COLACE) 100 MG capsule Take 200 mg by mouth daily as needed (constipation).     DULoxetine (CYMBALTA) 60 MG capsule Take 2 capsules (120 mg total) by mouth daily. 60 capsule 3   hydrocortisone (ANUSOL-HC) 25 MG suppository Place 1 suppository (25 mg total) rectally 2 (two) times daily as needed for hemorrhoids or anal itching. (Patient not taking: Reported on 05/28/2022) 12 suppository 0    lidocaine-prilocaine (EMLA) cream Apply 30 minutes before appointment 30 g 2   linaclotide (LINZESS) 290 MCG CAPS capsule Take 1 capsule (290 mcg total) by mouth daily before breakfast. (Patient taking differently: Take 290 mcg by mouth daily as needed (Constipation).) 30 capsule 1   losartan (COZAAR) 25 MG tablet Take 25 mg by mouth daily.     methocarbamol (ROBAXIN) 500 MG tablet Take 1,000 mg by mouth 2 (two) times daily.     metoCLOPramide (REGLAN) 5 MG tablet Take 1 tablet by mouth 3 times daily 20-30 minutes before meals NO FURTHER REFILLS UNTIL SEEN, NEEDS AND  APPOINTMENT (Patient taking differently: Take 5 mg by mouth 3 (three) times daily before meals.) 90 tablet 0   metoprolol succinate (TOPROL-XL) 25 MG 24 hr tablet Take 1 tablet (25 mg total) by mouth daily. Take with or immediately following a meal. 30 tablet 2   nitroGLYCERIN (NITROSTAT) 0.4 MG SL tablet Place 1 tablet (0.4 mg total) under the tongue every 5 (five) minutes x 3 doses as needed for chest pain. (Patient not taking: Reported on 05/28/2022) 25 tablet 1   Oxycodone HCl 10 MG TABS Take 10 mg by mouth 5 (five) times daily as needed (for moderate pain).     Oxycodone HCl 10 MG TABS Take 1 tablet (10 mg total) by mouth every 4 (four) hours as needed for pain 180 tablet 0   pantoprazole (PROTONIX) 40 MG tablet Take 1 tablet (40 mg total) by mouth daily as needed (acid reflux). 30 tablet 2   potassium chloride SA (KLOR-CON M) 20 MEQ tablet Take 1 tablet (20 mEq total) by mouth 2 (two) times daily. 14 tablet 0   potassium chloride SA (KLOR-CON M) 20 MEQ tablet Take 1 tablet (20 mEq total) by mouth 2 (two) times daily. 14 tablet 0   prochlorperazine (COMPAZINE) 10 MG tablet Take 1 tablet (10 mg total) by mouth every 6 (six) hours as needed for nausea or vomiting. 30 tablet 2   rOPINIRole (REQUIP) 1 MG tablet Take 1 tablet (1 mg total) by mouth 3 (three) times daily as directed 270 tablet 1   rosuvastatin (CRESTOR) 10 MG tablet Take 10  mg by mouth at bedtime.     senna (SENOKOT) 8.6 MG tablet Take 17.2 mg by mouth daily as needed for constipation.     Tiotropium Bromide-Olodaterol (STIOLTO RESPIMAT) 2.5-2.5 MCG/ACT AERS Inhale 2 puffs into the lungs daily. 1 each 5   traZODone (DESYREL) 50 MG tablet TAKE 0.5-1 TABLETS (25-50 MG TOTAL) BY MOUTH AT BEDTIME AS NEEDED FOR SLEEP. (Patient taking differently: Take 50 mg by mouth at bedtime.) 90 tablet 1   No current facility-administered medications for this visit.   Facility-Administered Medications Ordered in Other Visits  Medication Dose Route Frequency Provider Last Rate Last Admin   sodium chloride flush (NS) 0.9 % injection 10 mL  10 mL Intracatheter Once Si Gaul, MD        SURGICAL HISTORY:  Past Surgical History:  Procedure Laterality Date   BACK SURGERY     BIOPSY  08/31/2021   Procedure: BIOPSY;  Surgeon: Tressia Danas, MD;  Location: [facility] ENDOSCOPY;  Service: Gastroenterology;;   BLEPHAROPLASTY  10/10/2017   BRONCHIAL BIOPSY  11/21/2021   Procedure: BRONCHIAL BIOPSIES;  Surgeon: Oretha Milch, MD;  Location: [facility] ENDOSCOPY;  Service: Cardiopulmonary;;   BRONCHIAL BRUSHINGS  11/21/2021   Procedure: BRONCHIAL BRUSHINGS;  Surgeon: Oretha Milch, MD;  Location: [facility] ENDOSCOPY;  Service: Cardiopulmonary;;   BRONCHIAL NEEDLE ASPIRATION BIOPSY  11/21/2021   Procedure: BRONCHIAL NEEDLE ASPIRATION BIOPSIES;  Surgeon: Oretha Milch, MD;  Location: [facility] ENDOSCOPY;  Service: Cardiopulmonary;;   BRONCHIAL WASHINGS  11/21/2021   Procedure: BRONCHIAL WASHINGS;  Surgeon: Oretha Milch, MD;  Location: [facility] ENDOSCOPY;  Service: Cardiopulmonary;;   CATARACT EXTRACTION, BILATERAL     CESAREAN SECTION     CHOLECYSTECTOMY     COLONOSCOPY  10/2016   COLONOSCOPY WITH PROPOFOL N/A 08/31/2021   Procedure: COLONOSCOPY WITH PROPOFOL;  Surgeon: Tressia Danas, MD;  Location: [facility] ENDOSCOPY;  Service: Gastroenterology;  Laterality: N/A;   ESOPHAGOGASTRODUODENOSCOPY (EGD) WITH PROPOFOL  N/A  08/31/2021   Procedure: ESOPHAGOGASTRODUODENOSCOPY (EGD) WITH PROPOFOL;  Surgeon: Tressia Danas, MD;  Location: [facility] ENDOSCOPY;  Service: Gastroenterology;  Laterality: N/A;   HAND SURGERY Bilateral    carpal tunnel   HEMORRHOID SURGERY     HEMOSTASIS CONTROL  11/21/2021   Procedure: HEMOSTASIS CONTROL;  Surgeon: Oretha Milch, MD;  Location: [facility] ENDOSCOPY;  Service: Cardiopulmonary;;   INTERSTIM IMPLANT PLACEMENT     IR IMAGING GUIDED PORT INSERTION  01/17/2022   KNEE SURGERY Left    arthroscopy x3   LOWER EXTREMITY ANGIOGRAPHY N/A 07/16/2016   Procedure: Lower Extremity Angiography;  Surgeon: Yates Decamp, MD;  Location: [facility] INVASIVE CV LAB;  Service: Cardiovascular;  Laterality: N/A;   LOWER EXTREMITY INTERVENTION N/A 08/06/2016   Procedure: Lower Extremity Intervention;  Surgeon: Yates Decamp, MD;  Location: [facility] INVASIVE CV LAB;  Service: Cardiovascular;  Laterality: N/A;   NECK SURGERY     OTHER SURGICAL HISTORY  2013   bladder stimulator in back   PERIPHERAL VASCULAR BALLOON ANGIOPLASTY  08/06/2016   Procedure: Peripheral Vascular Balloon Angioplasty;  Surgeon: Yates Decamp, MD;  Location: [facility] INVASIVE CV LAB;  Service: Cardiovascular;;  Right SFA   POLYPECTOMY  08/31/2021   Procedure: POLYPECTOMY;  Surgeon: Tressia Danas, MD;  Location: [facility] ENDOSCOPY;  Service: Gastroenterology;;   TENNIS ELBOW RELEASE/NIRSCHEL PROCEDURE Right 12/2015   TOE SURGERY Right    right foot second and fourth toe   TOTAL HIP ARTHROPLASTY Left    TOTAL HIP REVISION Left 10/13/2017   Procedure: LEFT TOTAL HIP REVISION ACETABULUM, OPEN REDUCTION INTERNAL WITH BONE GRAFT FIXATION LEFT GREATER TROCHANTERIC FRACTURE;  Surgeon: Gean Birchwood, MD;  Location: [facility] ORS;  Service: Orthopedics;  Laterality: Left;   TOTAL KNEE ARTHROPLASTY Right 01/12/2016   Procedure: RIGHT TOTAL KNEE ARTHROPLASTY;  Surgeon: Beverely Low, MD;  Location: [facility] OR;  Service: Orthopedics;  Laterality: Right;   TUBAL LIGATION     UPPER GI ENDOSCOPY   10/2016   VAGINAL HYSTERECTOMY     VIDEO BRONCHOSCOPY WITH ENDOBRONCHIAL ULTRASOUND N/A 11/21/2021   Procedure: VIDEO BRONCHOSCOPY WITH ENDOBRONCHIAL ULTRASOUND;  Surgeon: Oretha Milch, MD;  Location: [facility] ENDOSCOPY;  Service: Cardiopulmonary;  Laterality: N/A;    REVIEW OF SYSTEMS:   Review of Systems  Constitutional: Positive for fatigue, appetite change, weight loss.  Negative for  chills and fever HENT: Negative for mouth sores, nosebleeds, sore throat and trouble swallowing.   Eyes: Negative for eye problems and icterus.  Respiratory: Positive for baseline dyspnea on exertion and occasional intermittent cough.  Negative for hemoptysis and wheezing.   Cardiovascular: Negative for chest pain and leg swelling.  Gastrointestinal: Positive for constipation.  Positive for intermittent nausea.  Negative for diarrhea and vomiting.  Genitourinary: Negative for bladder incontinence, difficulty urinating, dysuria, frequency and hematuria.   Musculoskeletal: Positive for neurolyse weakness.  Negative for back pain, gait problem, neck pain and neck stiffness.  Skin: Negative for itching and rash.  Neurological: Positive for generalized weakness. Negative for dizziness, extremity  gait problem, headaches, light-headedness and seizures.  Hematological: Negative for adenopathy. Does not bruise/bleed easily.  Psychiatric/Behavioral: Negative for confusion, depression and sleep disturbance. The patient is not nervous/anxious.     PHYSICAL EXAMINATION:  Blood pressure 125/74, pulse 85, temperature (!) 96.8 F (36 C), temperature source Tympanic, resp. rate 18, height 5\' 3"  (1.6 m), weight 169 lb 6 oz (76.8 kg), SpO2 93 %.  ECOG PERFORMANCE STATUS: 2-3  Physical Exam  Constitutional: Oriented to person, place, and time and  well-developed, well-nourished, and in no distress.  HENT:  Head: Normocephalic and atraumatic.  Mouth/Throat: Oropharynx is clear and moist. No oropharyngeal exudate.  Eyes:  Conjunctivae are normal. Right eye exhibits no discharge. Left eye exhibits no discharge. No scleral icterus.  Neck: Normal range of motion. Neck supple.  Cardiovascular: Normal rate, regular rhythm, normal heart sounds and intact distal pulses.   Pulmonary/Chest: Effort normal and breath sounds normal. No respiratory distress. No wheezes. No rales.  Abdominal: Soft. Bowel sounds are normal. Exhibits no distension and no mass. There is no tenderness.  Musculoskeletal: Normal range of motion. Exhibits no edema.  Lymphadenopathy:    No cervical adenopathy.  Neurological: Alert and oriented to person, place, and time. Exhibits for muscle wasting. Examined in the wheelchair. Uses a cane.  Skin: Skin is warm and dry. No rash noted. Not diaphoretic. No erythema. No pallor.  Psychiatric: Mood, memory and judgment normal.  Vitals reviewed.  LABORATORY DATA: Lab Results  Component Value Date   WBC 4.0 05/28/2022   HGB 9.4 (L) 05/28/2022   HCT 28.2 (L) 05/28/2022   MCV 98.9 05/28/2022   PLT 143 (L) 05/28/2022      Chemistry      Component Value Date/Time   NA 137 05/28/2022 0856   NA 140 09/28/2019 0948   K 2.8 (L) 05/28/2022 0856   K 3.5 03/19/2011 0722   CL 99 05/28/2022 0856   CO2 31 05/28/2022 0856   BUN 8 05/28/2022 0856   BUN 12 09/28/2019 0948   CREATININE 0.78 05/28/2022 0856      Component Value Date/Time   CALCIUM 8.4 (L) 05/28/2022 0856   ALKPHOS 71 05/28/2022 0856   AST 17 05/28/2022 0856   ALT 10 05/28/2022 0856   BILITOT 0.4 05/28/2022 0856       RADIOGRAPHIC STUDIES:  No results found.   ASSESSMENT/PLAN:  This is a very pleasant 72 year old Caucasian female diagnosed with extensive stage (T4, N2, M1 C) small cell lung cancer.  The patient presented with a large right lower lobe consolidative mass in addition to right hilar mediastinal lymphadenopathy and extensive bony metastases.  The patient was diagnosed in November 2023.   Patient completed palliative  radiotherapy to the left scapular region and right hip under the care of Dr. Roselind Messier which was completed on 01/02/2022.   The patient is currently undergoing palliative systemic chemotherapy with carboplatin for an AUC of 5 on day 1, etoposide 100 mg/m on days 1, 2, and 3 with Cosela 240 mg as well as Imfinzi 1500 mg on day 1.  The patient receives treatment IV every 3 weeks.  She is status post 7 cycles.  She does tolerate treatment well although she had cytopenias on labs which require supportive measures such as blood transfusions. Starting from cycle #5, she started maintenance immunotherapy with Imfinzi every 4 weeks.   Labs are pending.  Patient is currently on single agent immunotherapy.   As long as her labs are in parameters, she will proceed with cycle #8 with Imfinzi today scheduled.  I will arrange for restaging CT scan of the chest, abdomen, pelvis to restage her disease prior to her next appointment.  Regarding her fatigue and weakness, this could be multifactorial secondary to deconditioning medication side effect as she is on several medications to make her drowsy.  Advised her to avoid medications that cause drowsiness when possible such as her muscle relaxer and pain medication.  We will check her thyroid today.  I will also  arrange for brain MRI to rule out metastatic disease to the brain which she can have performed at her earliest convenience given her forgetfulness.  The patient denies any signs and symptoms of infection such as dysuria, diarrhea, upper respiratory infection, skin infection, etc.  Her brain MRI is negative for metastatic disease to the brain, we would recommend seeing her PCP for a dementia assessment.  Will reach out to home health to see if she solvable for home PT.  I also encouraged her to purchase foot pedals.  She can use B12 over-the-counter to see if it helps with her fatigue.  She is also been losing weight which can contribute to fatigue.  She is scheduled  to meet with a member nutritionist team while in the infusion room today.  I encouraged her to make her own protein drinks.   Recommend that she proceed with cycle #8 today as scheduled.   We will see him back for a follow up visit in 4 weeks for evaluation and repeat blood work before starting cycle #9   We reviewed constipation education.  She was advised to use a stool softener and titrate this up to keep her bowels regular.  She is advised to use laxatives if she has not had a bowel movement every day or every other day.  Also reviewed his nausea management I sent her prescription of Compazine and Zofran.  She was advised that she may alternate these if needed for better control of her nausea.  The patient was advised to call immediately if she has any concerning symptoms in the interval. The patient voices understanding of current disease status and treatment options and is in agreement with the current care plan. All questions were answered. The patient knows to call the clinic with any problems, questions or concerns. We can certainly see the patient much sooner if necessary     Orders Placed This Encounter  Procedures   MR Brain W Wo Contrast    Standing Status:   Future    Standing Expiration Date:   06/26/2023    Order Specific Question:   If indicated for the ordered procedure, I authorize the administration of contrast media per Radiology protocol    Answer:   Yes    Order Specific Question:   What is the patient's sedation requirement?    Answer:   No Sedation    Order Specific Question:   Does the patient have a pacemaker or implanted devices?    Answer:   No    Order Specific Question:   Use SRS Protocol?    Answer:   No    Order Specific Question:   Preferred imaging location?    Answer:   [facility] (table limit - 550 lbs)   CT Abdomen Pelvis W Contrast    Standing Status:   Future    Standing Expiration Date:   06/26/2023    Order Specific Question:   If  indicated for the ordered procedure, I authorize the administration of contrast media per Radiology protocol    Answer:   Yes    Order Specific Question:   Does the patient have a contrast media/X-ray dye allergy?    Answer:   No    Order Specific Question:   Preferred imaging location?    Answer:   [facility]    Order Specific Question:   If indicated for the ordered procedure, I authorize the administration of oral contrast media per Radiology protocol  Answer:   Yes   CT Chest W Contrast    Standing Status:   Future    Standing Expiration Date:   06/26/2023    Order Specific Question:   If indicated for the ordered procedure, I authorize the administration of contrast media per Radiology protocol    Answer:   Yes    Order Specific Question:   Does the patient have a contrast media/X-ray dye allergy?    Answer:   No    Order Specific Question:   Preferred imaging location?    Answer:   [facility]   TSH    Standing Status:   Standing    Number of Occurrences:   1    Standing Expiration Date:   06/26/2023     . The total time spent in the appointment was 30-39 minutes.   Estalee Mccandlish L Rasheedah Reis, PA-C 06/26/22

## 2022-06-25 NOTE — Telephone Encounter (Signed)
This nurse reached out to patient related to missed appointments for today.  Patient states that she was not aware that her appointments had been moved to 6/11 from 6/12.  This nurse advised that we will check the schedule and see if we can get her rescheduled for this week and the schedulers will reach out to her to set the appointments.

## 2022-06-26 ENCOUNTER — Other Ambulatory Visit: Payer: Self-pay

## 2022-06-26 ENCOUNTER — Inpatient Hospital Stay: Payer: Medicare Other | Admitting: Physician Assistant

## 2022-06-26 ENCOUNTER — Inpatient Hospital Stay: Payer: Medicare Other

## 2022-06-26 ENCOUNTER — Inpatient Hospital Stay: Payer: Medicare Other | Attending: Radiation Oncology | Admitting: Dietician

## 2022-06-26 ENCOUNTER — Ambulatory Visit: Payer: Medicare Other

## 2022-06-26 ENCOUNTER — Other Ambulatory Visit: Payer: Medicare Other

## 2022-06-26 ENCOUNTER — Ambulatory Visit: Payer: Medicare Other | Admitting: Physician Assistant

## 2022-06-26 ENCOUNTER — Telehealth: Payer: Self-pay | Admitting: Physician Assistant

## 2022-06-26 VITALS — BP 125/74 | HR 85 | Temp 96.8°F | Resp 18 | Ht 63.0 in | Wt 169.4 lb

## 2022-06-26 DIAGNOSIS — Z95828 Presence of other vascular implants and grafts: Secondary | ICD-10-CM

## 2022-06-26 DIAGNOSIS — R531 Weakness: Secondary | ICD-10-CM

## 2022-06-26 DIAGNOSIS — Z79899 Other long term (current) drug therapy: Secondary | ICD-10-CM | POA: Diagnosis not present

## 2022-06-26 DIAGNOSIS — C3431 Malignant neoplasm of lower lobe, right bronchus or lung: Secondary | ICD-10-CM | POA: Diagnosis present

## 2022-06-26 DIAGNOSIS — C7951 Secondary malignant neoplasm of bone: Secondary | ICD-10-CM | POA: Diagnosis present

## 2022-06-26 DIAGNOSIS — Z5112 Encounter for antineoplastic immunotherapy: Secondary | ICD-10-CM

## 2022-06-26 DIAGNOSIS — F17210 Nicotine dependence, cigarettes, uncomplicated: Secondary | ICD-10-CM | POA: Diagnosis not present

## 2022-06-26 DIAGNOSIS — D6481 Anemia due to antineoplastic chemotherapy: Secondary | ICD-10-CM

## 2022-06-26 DIAGNOSIS — C3491 Malignant neoplasm of unspecified part of right bronchus or lung: Secondary | ICD-10-CM

## 2022-06-26 LAB — CBC WITH DIFFERENTIAL (CANCER CENTER ONLY)
Abs Immature Granulocytes: 0.01 10*3/uL (ref 0.00–0.07)
Basophils Absolute: 0 10*3/uL (ref 0.0–0.1)
Basophils Relative: 1 %
Eosinophils Absolute: 0.2 10*3/uL (ref 0.0–0.5)
Eosinophils Relative: 4 %
HCT: 26.5 % — ABNORMAL LOW (ref 36.0–46.0)
Hemoglobin: 8.8 g/dL — ABNORMAL LOW (ref 12.0–15.0)
Immature Granulocytes: 0 %
Lymphocytes Relative: 17 %
Lymphs Abs: 0.7 10*3/uL (ref 0.7–4.0)
MCH: 32.1 pg (ref 26.0–34.0)
MCHC: 33.2 g/dL (ref 30.0–36.0)
MCV: 96.7 fL (ref 80.0–100.0)
Monocytes Absolute: 0.3 10*3/uL (ref 0.1–1.0)
Monocytes Relative: 7 %
Neutro Abs: 2.8 10*3/uL (ref 1.7–7.7)
Neutrophils Relative %: 71 %
Platelet Count: 125 10*3/uL — ABNORMAL LOW (ref 150–400)
RBC: 2.74 MIL/uL — ABNORMAL LOW (ref 3.87–5.11)
RDW: 15.9 % — ABNORMAL HIGH (ref 11.5–15.5)
WBC Count: 3.9 10*3/uL — ABNORMAL LOW (ref 4.0–10.5)
nRBC: 0 % (ref 0.0–0.2)

## 2022-06-26 LAB — CMP (CANCER CENTER ONLY)
ALT: 9 U/L (ref 0–44)
AST: 14 U/L — ABNORMAL LOW (ref 15–41)
Albumin: 3.2 g/dL — ABNORMAL LOW (ref 3.5–5.0)
Alkaline Phosphatase: 69 U/L (ref 38–126)
Anion gap: 4 — ABNORMAL LOW (ref 5–15)
BUN: 11 mg/dL (ref 8–23)
CO2: 33 mmol/L — ABNORMAL HIGH (ref 22–32)
Calcium: 8.8 mg/dL — ABNORMAL LOW (ref 8.9–10.3)
Chloride: 99 mmol/L (ref 98–111)
Creatinine: 0.63 mg/dL (ref 0.44–1.00)
GFR, Estimated: >60 mL/min (ref >60)
Glucose, Bld: 107 mg/dL — ABNORMAL HIGH (ref 70–99)
Potassium: 3.3 mmol/L — ABNORMAL LOW (ref 3.5–5.1)
Sodium: 136 mmol/L (ref 135–145)
Total Bilirubin: 0.3 mg/dL (ref 0.3–1.2)
Total Protein: 6.5 g/dL (ref 6.5–8.1)

## 2022-06-26 LAB — SAMPLE TO BLOOD BANK

## 2022-06-26 MED ORDER — ONDANSETRON HCL 8 MG PO TABS
8.0000 mg | ORAL_TABLET | Freq: Three times a day (TID) | ORAL | 2 refills | Status: DC | PRN
Start: 2022-06-26 — End: 2022-09-12

## 2022-06-26 MED ORDER — SODIUM CHLORIDE 0.9 % IV SOLN
1500.0000 mg | Freq: Once | INTRAVENOUS | Status: AC
  Administered 2022-06-26: 1500 mg via INTRAVENOUS
  Filled 2022-06-26: qty 30

## 2022-06-26 MED ORDER — SODIUM CHLORIDE 0.9% FLUSH
10.0000 mL | Freq: Once | INTRAVENOUS | Status: AC
  Administered 2022-06-26: 10 mL

## 2022-06-26 MED ORDER — PROCHLORPERAZINE MALEATE 10 MG PO TABS
10.0000 mg | ORAL_TABLET | Freq: Four times a day (QID) | ORAL | 2 refills | Status: DC | PRN
Start: 2022-06-26 — End: 2022-09-12

## 2022-06-26 MED ORDER — HEPARIN SOD (PORK) LOCK FLUSH 100 UNIT/ML IV SOLN
500.0000 [IU] | Freq: Once | INTRAVENOUS | Status: AC | PRN
  Administered 2022-06-26: 500 [IU]

## 2022-06-26 MED ORDER — SODIUM CHLORIDE 0.9 % IV SOLN: Freq: Once | INTRAVENOUS | Status: AC

## 2022-06-26 MED ORDER — SODIUM CHLORIDE 0.9% FLUSH
10.0000 mL | INTRAVENOUS | Status: DC | PRN
  Administered 2022-06-26: 10 mL

## 2022-06-26 NOTE — Progress Notes (Signed)
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 She is receiving maintenance Imfinzi q28d. Patient is under the care of Dr. Arbutus Ped.  Past medical history includes HTN, cerebrovascular disease, COPD, OSA, GERD, gastroparesis, gastritis, rectal ulcer, temporal lobe epilepsy, DDD, left sided weakness, h/o cervical cancer, raynaud's, PVD, IDA, chronic constipation  Met with patient in infusion. She is sleeping at time of visit but awakens easily with name call. Patient reports poor appetite. Foods taste off and nothing sounds good to her. Patient reports thinking all day about what she would want to eat. Once she decides on something and makes/orders it she no longer wants it. She does not like Boost/Ensure. Patient does likes milk.   Nutrition Focused Physical Exam: deferred     Medications: cymbalta, linzess, losartan, robaxin, reglan, toprol, zofran, oxycodone, protonix, klor-con, compazine, requip, crestor, senna, trazodone   Labs: K 3.3, albumin 3.2   Anthropometrics:   Height: 5'3" Weight: 169 lb 6 oz UBW: 185 lb 5 oz (3/6) BMI: 30   NUTRITION DIAGNOSIS: Unintended weight loss related to cancer as evidenced by ~9% (16 lb) wt loss in 3 months - severe for time frame   INTERVENTION:  Educated on strategies for altered taste. Suggested trying baking soda salt water rinses several times daily before meals - handout with tips + recipe provided Encouraged small frequent meals/snacks every 2-3 hours vs waiting until feeling hungry, suggested eating by the clock - handout with high calorie high protein snack ideas provided  Discussed soft moist high protein foods for ease of intake as well as encouraged keeping a variety of heat/serve meals to conserve energy with preparing meals Provided samples of CIB powder for patient to try.  Educated to mix with one cup whole milk, recommend drinking 2-3/day  Contact information given    MONITORING, EVALUATION, GOAL: Patient will tolerate increased calories and protein to minimize further weight loss during treatment    Next Visit: Tuesday August 6 during infusion

## 2022-06-26 NOTE — Telephone Encounter (Signed)
The patient was unaware of her appointment yesterday. This was rescheduled to today and new appointment time was confirmed with the patient yesterday. However, the patient has not arrived to her appointment. Attempted to call her home phone number and mobile number. Unable to leave message on either phone. Will continue to reach patient and work on rescheduling her for tomorrow if able to reach her.

## 2022-06-26 NOTE — Patient Instructions (Signed)
[facility]  Discharge Instructions: Thank you for choosing [facility] to provide your oncology and hematology care.   If you have a lab appointment with the Cancer Center, please go directly to the Cancer Center and check in at the registration area.   Wear comfortable clothing and clothing appropriate for easy access to any Portacath or PICC line.   We strive to give you quality time with your provider. You may need to reschedule your appointment if you arrive late (15 or more minutes).  Arriving late affects you and other patients whose appointments are after yours.  Also, if you miss three or more appointments without notifying the office, you may be dismissed from the clinic at the provider's discretion.      For prescription refill requests, have your pharmacy contact our office and allow 72 hours for refills to be completed.    Today you received the following chemotherapy and/or immunotherapy agents: durvalumab        To help prevent nausea and vomiting after your treatment, we encourage you to take your nausea medication as directed.  BELOW ARE SYMPTOMS THAT SHOULD BE REPORTED IMMEDIATELY: *FEVER GREATER THAN 100.4 F (38 C) OR HIGHER *CHILLS OR SWEATING *NAUSEA AND VOMITING THAT IS NOT CONTROLLED WITH YOUR NAUSEA MEDICATION *UNUSUAL SHORTNESS OF BREATH *UNUSUAL BRUISING OR BLEEDING *URINARY PROBLEMS (pain or burning when urinating, or frequent urination) *BOWEL PROBLEMS (unusual diarrhea, constipation, pain near the anus) TENDERNESS IN MOUTH AND THROAT WITH OR WITHOUT PRESENCE OF ULCERS (sore throat, sores in mouth, or a toothache) UNUSUAL RASH, SWELLING OR PAIN  UNUSUAL VAGINAL DISCHARGE OR ITCHING   Items with * indicate a potential emergency and should be followed up as soon as possible or go to the Emergency Department if any problems should occur.  Please show the CHEMOTHERAPY ALERT CARD or IMMUNOTHERAPY ALERT CARD at  check-in to the Emergency Department and triage nurse.  Should you have questions after your visit or need to cancel or reschedule your appointment, please contact [facility]  Dept: [phone]  and follow the prompts.  Office hours are 8:00 a.m. to 4:30 p.m. Monday - Friday. Please note that voicemails left after 4:00 p.m. may not be returned until the following business day.  We are closed weekends and major holidays. You have access to a nurse at all times for urgent questions. Please call the main number to the clinic Dept: [phone] and follow the prompts.   For any non-urgent questions, you may also contact your provider using MyChart. We now offer e-Visits for anyone 18 and older to request care online for non-urgent symptoms. For details visit [email or web address].   Also download the MyChart app! Go to the app store, search "MyChart", open the app, select [facility], and log in with your MyChart username and password.   

## 2022-07-02 ENCOUNTER — Other Ambulatory Visit (HOSPITAL_COMMUNITY): Payer: Self-pay

## 2022-07-02 MED ORDER — DULOXETINE HCL 60 MG PO CPEP
120.0000 mg | ORAL_CAPSULE | Freq: Every day | ORAL | 3 refills | Status: DC
  Filled 2022-07-02 – 2022-07-10 (×2): qty 60, 30d supply, fill #0

## 2022-07-02 MED ORDER — METHOCARBAMOL 500 MG PO TABS
500.0000 mg | ORAL_TABLET | Freq: Four times a day (QID) | ORAL | 1 refills | Status: DC | PRN
  Filled 2022-07-02 – 2022-07-10 (×2): qty 120, 30d supply, fill #0

## 2022-07-02 MED ORDER — OXYCODONE HCL 10 MG PO TABS
10.0000 mg | ORAL_TABLET | ORAL | 0 refills | Status: DC | PRN
  Filled 2022-07-02 – 2022-07-10 (×2): qty 180, 30d supply, fill #0

## 2022-07-09 ENCOUNTER — Other Ambulatory Visit (HOSPITAL_COMMUNITY): Payer: Self-pay

## 2022-07-10 ENCOUNTER — Other Ambulatory Visit (HOSPITAL_COMMUNITY): Payer: Self-pay

## 2022-07-11 ENCOUNTER — Telehealth: Payer: Self-pay | Admitting: Physician Assistant

## 2022-07-11 ENCOUNTER — Other Ambulatory Visit: Payer: Self-pay

## 2022-07-11 MED ORDER — LINACLOTIDE 290 MCG PO CAPS: 290.0000 ug | ORAL_CAPSULE | Freq: Every day | ORAL | 6 refills | Status: DC

## 2022-07-11 NOTE — Telephone Encounter (Signed)
Patient called asking for someone to send medication to the pharmacy for her because she has not had a BM for about 3 days now.

## 2022-07-11 NOTE — Telephone Encounter (Signed)
Refill sent.

## 2022-07-12 ENCOUNTER — Emergency Department (HOSPITAL_COMMUNITY)
Admission: EM | Admit: 2022-07-12 | Discharge: 2022-07-12 | Disposition: A | Discharge status: Home / Self Care | Payer: Medicare Other | Attending: Emergency Medicine | Admitting: Emergency Medicine

## 2022-07-12 ENCOUNTER — Telehealth: Payer: Self-pay | Admitting: Medical Oncology

## 2022-07-12 ENCOUNTER — Emergency Department (HOSPITAL_COMMUNITY): Payer: Medicare Other

## 2022-07-12 ENCOUNTER — Other Ambulatory Visit: Payer: Self-pay

## 2022-07-12 ENCOUNTER — Encounter (HOSPITAL_COMMUNITY): Payer: Self-pay

## 2022-07-12 DIAGNOSIS — R197 Diarrhea, unspecified: Secondary | ICD-10-CM | POA: Diagnosis present

## 2022-07-12 DIAGNOSIS — J449 Chronic obstructive pulmonary disease, unspecified: Secondary | ICD-10-CM | POA: Insufficient documentation

## 2022-07-12 DIAGNOSIS — Z79899 Other long term (current) drug therapy: Secondary | ICD-10-CM | POA: Insufficient documentation

## 2022-07-12 DIAGNOSIS — Z8541 Personal history of malignant neoplasm of cervix uteri: Secondary | ICD-10-CM | POA: Diagnosis not present

## 2022-07-12 DIAGNOSIS — Z7982 Long term (current) use of aspirin: Secondary | ICD-10-CM | POA: Diagnosis not present

## 2022-07-12 DIAGNOSIS — I10 Essential (primary) hypertension: Secondary | ICD-10-CM | POA: Insufficient documentation

## 2022-07-12 LAB — CBC WITH DIFFERENTIAL/PLATELET
Abs Immature Granulocytes: 0.01 10*3/uL (ref 0.00–0.07)
Basophils Absolute: 0 10*3/uL (ref 0.0–0.1)
Basophils Relative: 0 %
Eosinophils Absolute: 0 10*3/uL (ref 0.0–0.5)
Eosinophils Relative: 1 %
HCT: 27.9 % — ABNORMAL LOW (ref 36.0–46.0)
Hemoglobin: 8.7 g/dL — ABNORMAL LOW (ref 12.0–15.0)
Immature Granulocytes: 0 %
Lymphocytes Relative: 10 %
Lymphs Abs: 0.5 10*3/uL — ABNORMAL LOW (ref 0.7–4.0)
MCH: 30.5 pg (ref 26.0–34.0)
MCHC: 31.2 g/dL (ref 30.0–36.0)
MCV: 97.9 fL (ref 80.0–100.0)
Monocytes Absolute: 0.3 10*3/uL (ref 0.1–1.0)
Monocytes Relative: 7 %
Neutro Abs: 4.2 10*3/uL (ref 1.7–7.7)
Neutrophils Relative %: 82 %
Platelets: 135 10*3/uL — ABNORMAL LOW (ref 150–400)
RBC: 2.85 MIL/uL — ABNORMAL LOW (ref 3.87–5.11)
RDW: 16.3 % — ABNORMAL HIGH (ref 11.5–15.5)
WBC: 5 10*3/uL (ref 4.0–10.5)
nRBC: 0 % (ref 0.0–0.2)

## 2022-07-12 LAB — URINALYSIS, ROUTINE W REFLEX MICROSCOPIC
Bilirubin Urine: NEGATIVE
Glucose, UA: NEGATIVE mg/dL
Hgb urine dipstick: NEGATIVE
Ketones, ur: NEGATIVE mg/dL
Leukocytes,Ua: NEGATIVE
Nitrite: NEGATIVE
Protein, ur: NEGATIVE mg/dL
Specific Gravity, Urine: 1.034 — ABNORMAL HIGH (ref 1.005–1.030)
pH: 8 (ref 5.0–8.0)

## 2022-07-12 LAB — POC OCCULT BLOOD, ED: Fecal Occult Bld: POSITIVE — AB

## 2022-07-12 LAB — COMPREHENSIVE METABOLIC PANEL
ALT: 15 U/L (ref 0–44)
AST: 22 U/L (ref 15–41)
Albumin: 3.6 g/dL (ref 3.5–5.0)
Alkaline Phosphatase: 64 U/L (ref 38–126)
Anion gap: 10 (ref 5–15)
BUN: 11 mg/dL (ref 8–23)
CO2: 30 mmol/L (ref 22–32)
Calcium: 8.9 mg/dL (ref 8.9–10.3)
Chloride: 96 mmol/L — ABNORMAL LOW (ref 98–111)
Creatinine, Ser: 0.75 mg/dL (ref 0.44–1.00)
GFR, Estimated: >60 mL/min (ref >60)
Glucose, Bld: 105 mg/dL — ABNORMAL HIGH (ref 70–99)
Potassium: 3.5 mmol/L (ref 3.5–5.1)
Sodium: 136 mmol/L (ref 135–145)
Total Bilirubin: 0.8 mg/dL (ref 0.3–1.2)
Total Protein: 7.2 g/dL (ref 6.5–8.1)

## 2022-07-12 LAB — LIPASE, BLOOD: Lipase: 22 U/L (ref 11–51)

## 2022-07-12 MED ORDER — OXYCODONE HCL ER 10 MG PO T12A
10.0000 mg | EXTENDED_RELEASE_TABLET | Freq: Once | ORAL | Status: AC
  Administered 2022-07-12: 10 mg via ORAL
  Filled 2022-07-12: qty 1

## 2022-07-12 MED ORDER — IOHEXOL 300 MG/ML  SOLN
100.0000 mL | Freq: Once | INTRAMUSCULAR | Status: AC | PRN
  Administered 2022-07-12: 100 mL via INTRAVENOUS

## 2022-07-12 MED ORDER — SODIUM CHLORIDE 0.9 % IV BOLUS
1000.0000 mL | Freq: Once | INTRAVENOUS | Status: AC
  Administered 2022-07-12: 1000 mL via INTRAVENOUS

## 2022-07-12 MED ORDER — ONDANSETRON HCL 4 MG/2ML IJ SOLN
4.0000 mg | Freq: Once | INTRAMUSCULAR | Status: AC
  Administered 2022-07-12: 4 mg via INTRAVENOUS
  Filled 2022-07-12: qty 2

## 2022-07-12 NOTE — ED Notes (Signed)
Spoke to patient's husband Sammy who stated his daughter Duwayne Heck is coming to take the patient home. DC pending.

## 2022-07-12 NOTE — Discharge Instructions (Addendum)
You have been evaluated for your symptoms.  At this time you are stable to go home.  Follow-up closely with your doctor for further care.  Return if you have any concern.

## 2022-07-12 NOTE — Telephone Encounter (Signed)
1220-Constipation-I spoke to pt and her first comment was ." I got the gun , but I cannot find the bullets . I am not good at all, my bowels are locked up. I'm scared . I took Linzess and a laxative and nothing happened".  Her last BM was Monday and it was small balls of stool. She endorses weakness, cannot eat,has some abdominal pain and " can't pee". I instructed her to call EMS to take her to [facility] ED. She said she will call them.   1034-Pt LVM -"I am having some medical problems and I need to see someone". She sounded anxious. I returned call and left a message to go to ED because of her message and the anxiety I heard in her voice. I LVM on husbands phone.

## 2022-07-12 NOTE — ED Triage Notes (Signed)
Patient brought in by EMS due to diarrhea X earlier this morning. Per report, patient was dealing with constipation for 4 days, took magnesium citrate and is now having diarrhea. Patient reports lower abdominal cramping.

## 2022-07-12 NOTE — ED Provider Notes (Signed)
[facility] Provider Note   CSN: [id number] Arrival date & time: 07/12/22  1436     History {Add pertinent medical, surgical, social history, OB history to HPI:1} Chief Complaint  Patient presents with   Diarrhea    [name] is a 72 y.o. female.  The history is provided by the patient and medical records. No language interpreter was used.  Diarrhea    72 year old female significant history of prior stroke, COPD, GERD, hypertension, gastroparesis, cervical cancer, diverticular disease presenting complaining of diarrhea.  Patient report for the past week she has had constipation.  States that she tried to have a bowel movement without any success.  She feels constipated, having abdominal cramping and then also having trouble urinating throughout the day today.  She did endorse some subjective fever, endorsed nausea when she tries to eat.  She did took magnesium citrate and now is having some loose stools but still feels uncomfortable prompting this ER visit.  She denies any prior history of abdominal surgeries she denies any blood in her stool no burning urination.  No prior history of SBO.  Home Medications Prior to Admission medications   Medication Sig Start Date End Date Taking? Authorizing Provider  acetaminophen (TYLENOL) 325 MG tablet Take 650 mg by mouth 2 (two) times daily as needed for headache.    [provider]  albuterol (PROVENTIL) (2.5 MG/3ML) 0.083% nebulizer solution Inhale 3 mLs into the lungs every 4 (four) hours as needed for wheezing or shortness of breath (cough). Patient taking differently: Inhale 3 mLs into the lungs 2 (two) times daily as needed for wheezing or shortness of breath (cough). 09/01/21   Sabino Dick, DO  albuterol (VENTOLIN HFA) 108 (90 Base) MCG/ACT inhaler Inhale 2 puffs into the lungs 2 (two) times daily as needed for wheezing or shortness of breath.    [provider]   aspirin EC (ASPIRIN 81) 81 MG tablet Take 1 tablet (81 mg total) by mouth daily. 03/28/22     busPIRone (BUSPAR) 5 MG tablet Take 1 tablet (5 mg total) by mouth 2 (two) times daily. 02/18/18   Jomarie Longs, MD  Cholecalciferol (VITAMIN D-3 PO) Take 2,000 Units by mouth daily.    [provider]  cyanocobalamin (VITAMIN B12) 1000 MCG/ML injection Inject 1,000 mcg into the muscle every 30 (thirty) days. 09/11/21   [provider]  docusate sodium (COLACE) 100 MG capsule Take 200 mg by mouth daily as needed (constipation).    [provider]  DULoxetine (CYMBALTA) 60 MG capsule Take 2 capsules (120 mg total) by mouth daily. 03/28/22     DULoxetine (CYMBALTA) 60 MG capsule Take 2 capsules (120 mg total) by mouth daily. 07/02/22     hydrocortisone (ANUSOL-HC) 25 MG suppository Place 1 suppository (25 mg total) rectally 2 (two) times daily as needed for hemorrhoids or anal itching. Patient not taking: Reported on 05/28/2022 09/01/21   Sabino Dick, DO  lidocaine-prilocaine (EMLA) cream Apply 30 minutes before appointment 02/27/22   Heilingoetter, Cassandra L, PA-C  linaclotide (LINZESS) 290 MCG CAPS capsule Take 1 capsule (290 mcg total) by mouth daily before breakfast. 07/11/22   Unk Lightning, PA  losartan (COZAAR) 25 MG tablet Take 25 mg by mouth daily. 10/08/21   [provider]  methocarbamol (ROBAXIN) 500 MG tablet Take 1,000 mg by mouth 2 (two) times daily. 05/28/22   [provider]  methocarbamol (ROBAXIN) 500 MG tablet Take 1 tablet (500  mg total) by mouth every 6 hours as needed. 07/02/22     metoCLOPramide (REGLAN) 5 MG tablet Take 1 tablet by mouth 3 times daily 20-30 minutes before meals NO FURTHER REFILLS UNTIL SEEN, NEEDS AND APPOINTMENT Patient taking differently: Take 5 mg by mouth 3 (three) times daily before meals. 02/19/21   Hilarie Fredrickson, MD  metoprolol succinate (TOPROL-XL) 25 MG 24 hr tablet Take 1 tablet (25 mg total) by mouth  daily. Take with or immediately following a meal. 05/27/22 08/25/22  Yates Decamp, MD  nitroGLYCERIN (NITROSTAT) 0.4 MG SL tablet Place 1 tablet (0.4 mg total) under the tongue every 5 (five) minutes x 3 doses as needed for chest pain. Patient not taking: Reported on 05/28/2022 05/27/22   Yates Decamp, MD  ondansetron [facility]) 8 MG tablet Take 1 tablet (8 mg total) by mouth every 8 (eight) hours as needed for nausea or vomiting. 06/26/22   Heilingoetter, Cassandra L, PA-C  Oxycodone HCl 10 MG TABS Take 10 mg by mouth 5 (five) times daily as needed (for moderate pain). 09/13/21   [provider]  Oxycodone HCl 10 MG TABS Take 1 tablet (10 mg total) by mouth every 4 hours as needed for pain 07/02/22     pantoprazole (PROTONIX) 40 MG tablet Take 1 tablet (40 mg total) by mouth daily as needed (acid reflux). 04/30/22   Heilingoetter, Cassandra L, PA-C  potassium chloride SA (KLOR-CON M) 20 MEQ tablet Take 1 tablet (20 mEq total) by mouth 2 (two) times daily. 03/13/22   Heilingoetter, Cassandra L, PA-C  potassium chloride SA (KLOR-CON M) 20 MEQ tablet Take 1 tablet (20 mEq total) by mouth 2 (two) times daily. 05/28/22   Si Gaul, MD  prochlorperazine (COMPAZINE) 10 MG tablet Take 1 tablet (10 mg total) by mouth every 6 (six) hours as needed for nausea or vomiting. 06/26/22   Heilingoetter, Cassandra L, PA-C  rOPINIRole (REQUIP) 1 MG tablet Take 1 tablet (1 mg total) by mouth 3 (three) times daily as directed 03/28/22     rosuvastatin (CRESTOR) 10 MG tablet Take 10 mg by mouth at bedtime. 06/10/20   [provider]  senna (SENOKOT) 8.6 MG tablet Take 17.2 mg by mouth daily as needed for constipation.    [provider]  Tiotropium Bromide-Olodaterol (STIOLTO RESPIMAT) 2.5-2.5 MCG/ACT AERS Inhale 2 puffs into the lungs daily. 11/09/21   Parrett, Virgel Bouquet, NP  traZODone (DESYREL) 50 MG tablet TAKE 0.5-1 TABLETS (25-50 MG TOTAL) BY MOUTH AT BEDTIME AS NEEDED FOR SLEEP. Patient taking  differently: Take 50 mg by mouth at bedtime. 03/16/18   Jomarie Longs, MD      Allergies    Bee venom, Morphine and codeine, Neurontin [gabapentin], Codeine, Adhesive [tape], Sulfa antibiotics, and Sulfonamide derivatives    Review of Systems   Review of Systems  Gastrointestinal:  Positive for diarrhea.  All other systems reviewed and are negative.   Physical Exam Updated Vital Signs BP 96/65   Pulse 98   Temp 97.9 F (36.6 C) (Oral)   Resp 18   Ht 5' (1.524 m)   Wt 70.3 kg   SpO2 92%   BMI 30.27 kg/m  Physical Exam Vitals and nursing note reviewed.  Constitutional:      General: She is not in acute distress.    Appearance: She is well-developed.  HENT:     Head: Atraumatic.  Eyes:     Conjunctiva/sclera: Conjunctivae normal.  Cardiovascular:     Rate and Rhythm: Normal  rate and regular rhythm.     Pulses: Normal pulses.     Heart sounds: Normal heart sounds.  Pulmonary:     Effort: Pulmonary effort is normal.  Abdominal:     Palpations: Abdomen is soft.     Tenderness: There is abdominal tenderness (Diffusely tender no guarding or rebound tenderness).  Genitourinary:    Comments: Chaperone present during exam.  Normal rectal tone, no obvious mass, no thrombosed hemorrhoid, some hard stool noted in rectal vault.  Darker stool without any active bleeding. Musculoskeletal:     Cervical back: Neck supple.  Skin:    Findings: No rash.  Neurological:     Mental Status: She is alert.  Psychiatric:        Mood and Affect: Mood normal.     ED Results / Procedures / Treatments   Labs (all labs ordered are listed, but only abnormal results are displayed) Labs Reviewed  CBC WITH DIFFERENTIAL/PLATELET - Abnormal; Notable for the following components:      Result Value   RBC 2.85 (*)    Hemoglobin 8.7 (*)    HCT 27.9 (*)    RDW 16.3 (*)    Platelets 135 (*)    Lymphs Abs 0.5 (*)    All other components within normal limits  COMPREHENSIVE METABOLIC PANEL -  Abnormal; Notable for the following components:   Chloride 96 (*)    Glucose, Bld 105 (*)    All other components within normal limits  URINALYSIS, ROUTINE W REFLEX MICROSCOPIC - Abnormal; Notable for the following components:   Specific Gravity, Urine 1.034 (*)    All other components within normal limits  POC OCCULT BLOOD, ED - Abnormal; Notable for the following components:   Fecal Occult Bld POSITIVE (*)    All other components within normal limits  LIPASE, BLOOD    EKG None  Radiology CT CHEST ABDOMEN PELVIS W CONTRAST  Result Date: 07/12/2022 CLINICAL DATA:  History of small cell lung cancer. Constipation and diarrhea 4 days. EXAM: CT CHEST, ABDOMEN, AND PELVIS WITH CONTRAST TECHNIQUE: Multidetector CT imaging of the chest, abdomen and pelvis was performed following the standard protocol during bolus administration of intravenous contrast. RADIATION DOSE REDUCTION: This exam was performed according to the departmental dose-optimization program which includes automated exposure control, adjustment of the mA and/or kV according to patient size and/or use of iterative reconstruction technique. CONTRAST:  OMNIPAQUE IOHEXOL 300 MG/ML  SOLN COMPARISON:  05/24/2022 FINDINGS: CT CHEST FINDINGS Cardiovascular: Right-sided Port-A-Cath with tip over the cavoatrial junction. Heart is normal size. Mild calcified plaque over the mitral valve annulus. Mild calcified plaque over the 3 vessel coronary arteries. Thoracic aorta is normal in caliber. Pulmonary arterial system is unremarkable. Remaining vascular structures are unremarkable. Mediastinum/Nodes: No significant mediastinal or hilar adenopathy. Few small subcentimeter mediastinal lymph nodes are unchanged. Calcified precarinal lymph node. Lungs/Pleura: Lungs are adequately inflated. There multiple small subcentimeter right lung nodules which are unchanged. Patchy peripheral opacities over the right lower lobe mostly unchanged although there is  a new new subpleural 9 mm nodule over the posterior right lower lobe. Mild bilateral patchy subpleural hazy interstitial prominence unchanged. No effusion. Airways are normal. Musculoskeletal: Patchy sclerosis over the bony structures unchanged compatible with diffuse osseous metastatic disease. CT ABDOMEN PELVIS FINDINGS Hepatobiliary: Previous cholecystectomy. Stable subcentimeter hypodensity over the right lobe of the liver. Biliary tree is normal. Pancreas: Normal. Spleen: Normal. Adrenals/Urinary Tract: Adrenal glands are normal. Kidneys are normal in size without hydronephrosis or nephrolithiasis.  Ureters and bladder are normal. Stomach/Bowel: Stomach and small bowel are normal. Appendix is normal. There is diverticulosis of the colon. Mild wall thickening of the rectum which may be seen with proctitis. Vascular/Lymphatic: Moderate calcified plaque over the abdominal aorta which is normal in caliber. Remaining vascular structures are unremarkable. No significant adenopathy. Reproductive: Previous hysterectomy. Other: No significant free fluid. Musculoskeletal: Patchy sclerosis over the spine, iliac bones and sternum likely due to diffuse metastatic disease as this is not significantly changed. IMPRESSION: 1. Wall thickening of the rectum which may be seen with proctitis. 2. Multiple small subcentimeter right lung nodules which are unchanged. Patchy peripheral opacities over the right lower lobe unchanged with the exception of a new 9 mm subpleural nodule over the posterior right lower lobe. Recommend attention on follow-up. 3. Stable subcentimeter hypodensity over the right lobe of the liver. 4. Stable patchy subpleural interstitial changes. 5. Diverticulosis of the colon. 6. Diffuse osseous metastatic disease unchanged. 7. Aortic atherosclerosis. Atherosclerotic coronary artery disease. Aortic Atherosclerosis (ICD10-I70.0). Electronically Signed   By: Elberta Fortis M.D.   On: 07/12/2022 18:10     Procedures Procedures  {Document cardiac monitor, telemetry assessment procedure when appropriate:1}  Medications Ordered in ED Medications - No data to display  ED Course/ Medical Decision Making/ A&P   {   Click here for ABCD2, HEART and other calculatorsREFRESH Note before signing :1}                          Medical Decision Making Amount and/or Complexity of Data Reviewed Labs: ordered.   BP 96/65   Pulse 98   Temp 97.9 F (36.6 C) (Oral)   Resp 18   Ht 5' (1.524 m)   Wt 70.3 kg   SpO2 92%   BMI 30.27 kg/m   31:43 PM 72 year old female significant history of prior stroke, COPD, GERD, hypertension, gastroparesis, cervical cancer, diverticular disease presenting complaining of diarrhea.  Patient report for the past week she has had constipation.  States that she tried to have a bowel movement without any success.  She feels constipated, having abdominal cramping and then also having trouble urinating throughout the day today.  She did endorse some subjective fever, endorsed nausea when she tries to eat.  She did took magnesium citrate and now is having some loose stools but still feels uncomfortable prompting this ER visit.  She denies any prior history of abdominal surgeries she denies any blood in her stool no burning urination.  No prior history of SBO.  On exam, patient laying bed appears mildly uncomfortable but nontoxic.  Heart with normal rate and rhythm, lungs clear to auscultation abdomen is diffusely tender but otherwise soft with bowel sounds present.  On rectal exam with chaperone present patient does have some hard stools in the rectal vault along with dark color stool but not melanotic and no gross bleeding noted.  No obvious rectal mass.  -Labs ordered, independently viewed and interpreted by me.  Labs remarkable for fecal occult positive. Hgb 8.7 at baseline.   -The patient was maintained on a cardiac monitor.  I personally viewed and interpreted the cardiac  monitored which showed an underlying rhythm of: sinus tachycardia -Imaging independently viewed and interpreted by me and I agree with radiologist's interpretation.  Result remarkable for *** -This patient presents to the ED for concern of ***, this involves an extensive number of treatment options, and is a complaint that carries with it a high  risk of complications and morbidity.  The differential diagnosis includes *** -Co morbidities that complicate the patient evaluation includes *** -Treatment includes *** -Reevaluation of the patient after these medicines showed that the patient {resolved/improved/worsened:23923::"improved"} -PCP office notes or outside notes reviewed -Discussion with specialist *** -Escalation to admission/observation considered: patients feels much better, is comfortable with discharge, and will follow up with PCP -Prescription medication considered, patient comfortable with *** -Social Determinant of Health considered which includes ***   {Document critical care time when appropriate:1} {Document review of labs and clinical decision tools ie heart score, Chads2Vasc2 etc:1}  {Document your independent review of radiology images, and any outside records:1} {Document your discussion with family members, caretakers, and with consultants:1} {Document social determinants of health affecting pt's care:1} {Document your decision making why or why not admission, treatments were needed:1} Final Clinical Impression(s) / ED Diagnoses Final diagnoses:  None    Rx / DC Orders ED Discharge Orders     None

## 2022-07-16 ENCOUNTER — Encounter (HOSPITAL_COMMUNITY): Payer: Self-pay

## 2022-07-16 ENCOUNTER — Ambulatory Visit (HOSPITAL_COMMUNITY): Admission: RE | Admit: 2022-07-16 | Payer: Medicare Other | Source: Ambulatory Visit

## 2022-07-16 ENCOUNTER — Ambulatory Visit (HOSPITAL_COMMUNITY)
Admission: RE | Admit: 2022-07-16 | Discharge: 2022-07-16 | Disposition: A | Discharge status: Home / Self Care | Payer: Medicare Other | Source: Ambulatory Visit | Attending: Physician Assistant | Admitting: Physician Assistant

## 2022-07-16 DIAGNOSIS — C3491 Malignant neoplasm of unspecified part of right bronchus or lung: Secondary | ICD-10-CM

## 2022-07-16 NOTE — Progress Notes (Signed)
Patient arrived for MRI today but unfortunately we were unable to perform scan due to defective device. The patient states her Interstim Stimulator 3058 "hasn't worked in a while". She also does not know where her remote to the device is that is used to safely put in 'MRI mode'. Ms. Korando was given a form for "End of Life" of her device to be filled out by Physician responsible for stimulator.

## 2022-07-19 ENCOUNTER — Encounter: Payer: Self-pay | Admitting: Internal Medicine

## 2022-07-20 ENCOUNTER — Other Ambulatory Visit: Payer: Self-pay | Admitting: Cardiology

## 2022-07-20 DIAGNOSIS — R072 Precordial pain: Secondary | ICD-10-CM

## 2022-07-20 NOTE — Progress Notes (Unsigned)
[facility] OFFICE PROGRESS NOTE  [name], NP [address] [place] [address]  DIAGNOSIS:  Extensive stage (T4, N2, M1 C) small cell lung cancer presented with large right lower lobe consolidative mass in addition to right hilar and mediastinal lymphadenopathy and extensive bone metastasis diagnosed in November 2023.   PRIOR THERAPY: None  CURRENT THERAPY: Palliative systemic chemotherapy with carboplatin for AUC of 5 on day 1, etoposide 100 Mg/M2 on days 1, 2 and 3 with Cosela 240 Mg/M2 on the days of the chemotherapy as well as Imfinzi 1500 mg IV on day 1 every 3 weeks during the induction phase and then every 4 weeks during the maintenance if there is no evidence for disease progression. Status post 8 cycles. Starting from cycle #5 she will be on maintenance treatment with Imfinzi 1500 Mg IV every 4 weeks.   INTERVAL HISTORY: [name] 72 y.o. female returns to the clinic today for a follow-up visit accompanied by her daughter. The patient was last seen by myself on 06/26/22.  The patient is currently undergoing treatment and maintenance immunotherapy with Imfinzi 1500 mg IV every 4 weeks.  She tolerates this well without any concerning adverse side effects except ongoing fatigue and generalized weakness. At her last appointment, we reviewed her medications in her list that can cause drowsiness such as pain medication, CBD, and muscle relaxer. I ordered brain MRI at her last appointment to rule out metastatic disease to the brain but this has not been scheduled at this time because the radiology department needed information about her bladder stimulator. She had an MRI before in January 2024.  At her last appointment, she was also endorsing decreased appetite.  On exam today, she has some evidence of oral thrush.  Her daughter reported that she is eating a little bit better although it appears the patient lost 9 pounds since her last appointment.  We talked about  protein supplemental drinks at her last appointment.  She reports she ran out of these and is only drinking 1/day.  She is scheduled to see a member of the nutritionist team while in the infusion room at her next appointment.  Adding her fatigue, she continues to have anemia for which she is taking iron supplement.  Therefore her stool is dark.  However, interval since last being seen, she was seen in the ER on 07/12/22. she had been struggling with chronic constipation and took magnesium citrate. She takes stool softener daily at baseline. She then presented to the Er with abdominal pain and diarrhea. She had her restaging CT with CT CAP which showed stable malignancy. There was mild wall thickening in the rectum which may be seen in proctitis. Otherwise, no acute abnormalities. Her UA was negative. Her lipase was normal.  Ever, her stool cards were positive for blood.  She has previously seen Dr. Marina Goodell from GI.   Regarding her bowel movements, she states that she continues to have 1 loose bowel movement daily.  Denies any fevers.   She denies any chest pain or hemoptysis.  She reports baseline dyspnea on exertion and chronic occasional cough every once in awhile.  He sometimes feels nauseated without any vomiting. She is followed by Dr. Vear Clock in the pain clinic and is currently on Cymbalta and oxycodone every 4 hours.  She has a wound on her left shin from a fall.  She has left the same Band-Aid on for over a week.  The area is a little  erythematous.  She denies any headache or visual changes.  She recently had a restaging CT scan performed. She is here today for evaluation and to review her scan results before undergoing cycle #9    MEDICAL HISTORY: Past Medical History:  Diagnosis Date   Anxiety    Aortic atherosclerosis (HCC)    Bilateral cataracts    Carpal tunnel syndrome    Cervical cancer (HCC)    Chronic back pain    Claudication (HCC)    COPD (chronic obstructive pulmonary disease)  (HCC)    wheezing   DDD (degenerative disc disease), cervical    DDD (degenerative disc disease), lumbar    Depression    Diverticulosis    Dyspnea    on exertion   Gastroparesis    GERD (gastroesophageal reflux disease)    Hearing loss    Bilateral   History of blood transfusion    History of colon polyps    History of migraine    History of radiation therapy    Left scapula,left chest, right pelvis- 12/20/21-01/02/22- Dr. 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  MEDICATIONS:  Current Outpatient Medications  Medication Sig Dispense Refill   cephALEXin (KEFLEX) 500 MG capsule Take 1 capsule (500 mg total) by mouth 4 (four) times daily. 20 capsule 0   fluconazole (DIFLUCAN) 100 MG tablet Take 1 tablet (100 mg total) by mouth daily. 10 tablet 0   LORazepam (ATIVAN) 0.5 MG tablet Take 1 tablet 30 minutes before your MRI 2 tablet 0   mupirocin ointment (BACTROBAN) 2 % Apply 1 Application topically 2 (two) times daily. 20 g 0   acetaminophen (TYLENOL) 325 MG tablet Take 650 mg by mouth 2 (two) times daily as needed for headache.     albuterol (PROVENTIL) (2.5 MG/3ML) 0.083% nebulizer solution Inhale 3 mLs into the lungs every 4 (four) hours as needed for  wheezing or shortness of breath (cough). (Patient taking differently: Inhale 3 mLs into the lungs 2 (two) times daily as needed for wheezing or shortness of breath (cough).) 75 mL 12   albuterol (VENTOLIN HFA) 108 (90 Base) MCG/ACT inhaler Inhale 2 puffs into the lungs 2 (two) times daily as needed for wheezing or shortness of breath.     aspirin EC (ASPIRIN 81) 81 MG tablet Take 1 tablet (81 mg total) by mouth daily. 90 tablet 3   busPIRone (BUSPAR) 5 MG tablet Take 1 tablet (5 mg total) by mouth 2 (two) times daily. 60 tablet 0   Cholecalciferol (VITAMIN D-3 PO) Take 2,000  Units by mouth daily.     cyanocobalamin (VITAMIN B12) 1000 MCG/ML injection Inject 1,000 mcg into the muscle every 30 (thirty) days.     docusate sodium (COLACE) 100 MG capsule Take 200 mg by mouth daily as needed (constipation).     DULoxetine (CYMBALTA) 60 MG capsule Take 2 capsules (120 mg total) by mouth daily. 60 capsule 3   DULoxetine (CYMBALTA) 60 MG capsule Take 2 capsules (120 mg total) by mouth daily. 60 capsule 3   hydrocortisone (ANUSOL-HC) 25 MG suppository Place 1 suppository (25 mg total) rectally 2 (two) times daily as needed for hemorrhoids or anal itching. (Patient not taking: Reported on 05/28/2022) 12 suppository 0   lidocaine-prilocaine (EMLA) cream Apply 30 minutes before appointment 30 g 2   linaclotide (LINZESS) 290 MCG CAPS capsule Take 1 capsule (290 mcg total) by mouth daily before breakfast. 30 capsule 6   losartan (COZAAR) 25 MG tablet Take 25 mg by mouth daily.     methocarbamol (ROBAXIN) 500 MG tablet Take 1,000 mg by mouth 2 (two) times daily.     methocarbamol (ROBAXIN) 500 MG tablet Take 1 tablet (500 mg total) by mouth every 6 hours as needed. 120 tablet 1   metoCLOPramide (REGLAN) 5 MG tablet Take 1 tablet by mouth 3 times daily 20-30 minutes before meals NO FURTHER REFILLS UNTIL SEEN, NEEDS AND APPOINTMENT (Patient taking differently: Take 5 mg by mouth 3 (three) times daily before meals.) 90  tablet 0   metoprolol succinate (TOPROL-XL) 25 MG 24 hr tablet TAKE 1 TABLET BY MOUTH DAILY. TAKE WITH OR IMMEDIATELY FOLLOWING A MEAL. 90 tablet 3   nitroGLYCERIN (NITROSTAT) 0.4 MG SL tablet Place 1 tablet (0.4 mg total) under the tongue every 5 (five) minutes x 3 doses as needed for chest pain. (Patient not taking: Reported on 05/28/2022) 25 tablet 1   ondansetron (ZOFRAN) 8 MG tablet Take 1 tablet (8 mg total) by mouth every 8 (eight) hours as needed for nausea or vomiting. 30 tablet 2   Oxycodone HCl 10 MG TABS Take 10 mg by mouth 5 (five) times daily as needed (for moderate pain).     Oxycodone HCl 10 MG TABS Take 1 tablet (10 mg total) by mouth every 4 hours as needed for pain 180 tablet 0   pantoprazole (PROTONIX) 40 MG tablet Take 1 tablet (40 mg total) by mouth daily as needed (acid reflux). 30 tablet 2   potassium chloride SA (KLOR-CON M) 20 MEQ tablet Take 1 tablet (20 mEq total) by mouth 2 (two) times daily. 14 tablet 0   potassium chloride SA (KLOR-CON M) 20 MEQ tablet Take 1 tablet (20 mEq total) by mouth 2 (two) times daily. 14 tablet 0   prochlorperazine (COMPAZINE) 10 MG tablet Take 1 tablet (10 mg total) by mouth every 6 (six) hours as needed for nausea or vomiting. 30 tablet 2   rOPINIRole (REQUIP) 1 MG tablet Take 1 tablet (1 mg total) by mouth 3 (three) times daily as directed 270 tablet 1   rosuvastatin (CRESTOR) 10 MG tablet Take 10 mg by mouth at bedtime.     senna (SENOKOT) 8.6 MG tablet Take 17.2 mg by mouth daily as needed for constipation.     Tiotropium Bromide-Olodaterol (STIOLTO RESPIMAT) 2.5-2.5 MCG/ACT AERS Inhale 2 puffs into the lungs daily. 1 each 5   traZODone (DESYREL) 50 MG tablet TAKE 0.5-1 TABLETS (25-50 MG TOTAL) BY MOUTH AT BEDTIME AS NEEDED FOR SLEEP. (Patient taking differently: Take 50 mg by  mouth at bedtime.) 90 tablet 1   No current facility-administered medications for this visit.   Facility-Administered Medications Ordered in Other Visits   Medication Dose Route Frequency Provider Last Rate Last Admin   0.9 %  sodium chloride infusion   Intravenous Once Si Gaul, MD       durvalumab (IMFINZI) 1,500 mg in sodium chloride 0.9 % 100 mL chemo infusion  1,500 mg Intravenous Once Si Gaul, MD       heparin lock flush 100 unit/mL  500 Units Intracatheter Once PRN Si Gaul, MD       sodium chloride flush (NS) 0.9 % injection 10 mL  10 mL Intracatheter PRN Si Gaul, MD        SURGICAL HISTORY:  Past Surgical History:  Procedure Laterality Date   BACK SURGERY     BIOPSY  08/31/2021   Procedure: BIOPSY;  Surgeon: Tressia Danas, MD;  Location: [facility] ENDOSCOPY;  Service: Gastroenterology;;   BLEPHAROPLASTY  10/10/2017   BRONCHIAL BIOPSY  11/21/2021   Procedure: BRONCHIAL BIOPSIES;  Surgeon: Oretha Milch, MD;  Location: [facility] ENDOSCOPY;  Service: Cardiopulmonary;;   BRONCHIAL BRUSHINGS  11/21/2021   Procedure: BRONCHIAL BRUSHINGS;  Surgeon: Oretha Milch, MD;  Location: [facility] ENDOSCOPY;  Service: Cardiopulmonary;;   BRONCHIAL NEEDLE ASPIRATION BIOPSY  11/21/2021   Procedure: BRONCHIAL NEEDLE ASPIRATION BIOPSIES;  Surgeon: Oretha Milch, MD;  Location: [facility] ENDOSCOPY;  Service: Cardiopulmonary;;   BRONCHIAL WASHINGS  11/21/2021   Procedure: BRONCHIAL WASHINGS;  Surgeon: Oretha Milch, MD;  Location: [facility] ENDOSCOPY;  Service: Cardiopulmonary;;   CATARACT EXTRACTION, BILATERAL     CESAREAN SECTION     CHOLECYSTECTOMY     COLONOSCOPY  10/2016   COLONOSCOPY WITH PROPOFOL N/A 08/31/2021   Procedure: COLONOSCOPY WITH PROPOFOL;  Surgeon: Tressia Danas, MD;  Location: [facility] ENDOSCOPY;  Service: Gastroenterology;  Laterality: N/A;   ESOPHAGOGASTRODUODENOSCOPY (EGD) WITH PROPOFOL N/A 08/31/2021   Procedure: ESOPHAGOGASTRODUODENOSCOPY (EGD) WITH PROPOFOL;  Surgeon: Tressia Danas, MD;  Location: [facility] ENDOSCOPY;  Service: Gastroenterology;  Laterality: N/A;   HAND SURGERY Bilateral    carpal tunnel   HEMORRHOID SURGERY      HEMOSTASIS CONTROL  11/21/2021   Procedure: HEMOSTASIS CONTROL;  Surgeon: Oretha Milch, MD;  Location: [facility] ENDOSCOPY;  Service: Cardiopulmonary;;   INTERSTIM IMPLANT PLACEMENT     IR IMAGING GUIDED PORT INSERTION  01/17/2022   KNEE SURGERY Left    arthroscopy x3   LOWER EXTREMITY ANGIOGRAPHY N/A 07/16/2016   Procedure: Lower Extremity Angiography;  Surgeon: Yates Decamp, MD;  Location: [facility] INVASIVE CV LAB;  Service: Cardiovascular;  Laterality: N/A;   LOWER EXTREMITY INTERVENTION N/A 08/06/2016   Procedure: Lower Extremity Intervention;  Surgeon: Yates Decamp, MD;  Location: [facility] INVASIVE CV LAB;  Service: Cardiovascular;  Laterality: N/A;   NECK SURGERY     OTHER SURGICAL HISTORY  2013   bladder stimulator in back   PERIPHERAL VASCULAR BALLOON ANGIOPLASTY  08/06/2016   Procedure: Peripheral Vascular Balloon Angioplasty;  Surgeon: Yates Decamp, MD;  Location: [facility] INVASIVE CV LAB;  Service: Cardiovascular;;  Right SFA   POLYPECTOMY  08/31/2021   Procedure: POLYPECTOMY;  Surgeon: Tressia Danas, MD;  Location: [facility] ENDOSCOPY;  Service: Gastroenterology;;   TENNIS ELBOW RELEASE/NIRSCHEL PROCEDURE Right 12/2015   TOE SURGERY Right    right foot second and fourth toe   TOTAL HIP ARTHROPLASTY Left    TOTAL HIP REVISION Left 10/13/2017   Procedure: LEFT TOTAL HIP REVISION ACETABULUM, OPEN REDUCTION INTERNAL WITH BONE GRAFT FIXATION LEFT  GREATER TROCHANTERIC FRACTURE;  Surgeon: Gean Birchwood, MD;  Location: [facility] ORS;  Service: Orthopedics;  Laterality: Left;   TOTAL KNEE ARTHROPLASTY Right 01/12/2016   Procedure: RIGHT TOTAL KNEE ARTHROPLASTY;  Surgeon: Beverely Low, MD;  Location: [facility] OR;  Service: Orthopedics;  Laterality: Right;   TUBAL LIGATION     UPPER GI ENDOSCOPY  10/2016   VAGINAL HYSTERECTOMY     VIDEO BRONCHOSCOPY WITH ENDOBRONCHIAL ULTRASOUND N/A 11/21/2021   Procedure: VIDEO BRONCHOSCOPY WITH ENDOBRONCHIAL ULTRASOUND;  Surgeon: Oretha Milch, MD;  Location: [facility] ENDOSCOPY;  Service: Cardiopulmonary;   Laterality: N/A;    REVIEW OF SYSTEMS:   Constitutional: Positive for fatigue, appetite change, weight loss.  Negative for  chills and fever HENT: Negative for mouth sores, nosebleeds, sore throat and trouble swallowing.   Eyes: Negative for eye problems and icterus.  Respiratory: Positive for baseline dyspnea on exertion and occasional intermittent cough.  Negative for hemoptysis and wheezing.   Cardiovascular: Negative for chest pain and leg swelling.  Gastrointestinal: Positive for diarrhea once a day. Positive for dark stool. Positive for intermittent nausea.  Negative for vomiting.  Genitourinary: Negative for bladder incontinence, difficulty urinating, dysuria, frequency and hematuria.   Musculoskeletal: Positive for generalized weakness. Negative for back pain, gait problem, neck pain and neck stiffness.  Skin: Positive for wound on left shin. Negative for itching and rash.  Neurological: Positive for generalized weakness. Negative for dizziness, extremity  gait problem, headaches, light-headedness and seizures.  Hematological: Negative for adenopathy. Does not bruise/bleed easily.  Psychiatric/Behavioral: Negative for confusion, depression and sleep disturbance. The patient is not nervous/anxious.   PHYSICAL EXAMINATION:  Blood pressure 127/66, pulse 100, temperature 97.7 F (36.5 C), temperature source Oral, resp. rate 17, weight 160 lb 11.2 oz (72.9 kg), SpO2 97 %.  ECOG PERFORMANCE STATUS: 1-2  Physical Exam  Constitutional: Oriented to person, place, and time and well-developed, well-nourished, and in no distress.  HENT:  Head: Normocephalic and atraumatic.  Mouth/Throat: Oropharynx is clear and moist. No oropharyngeal exudate.  Eyes: Conjunctivae are normal. Right eye exhibits no discharge. Left eye exhibits no discharge. No scleral icterus.  Neck: Normal range of motion. Neck supple.  Cardiovascular: Normal rate, regular rhythm, normal heart sounds and intact distal  pulses.   Pulmonary/Chest: Effort normal and breath sounds normal. No respiratory distress. No wheezes. No rales.  Abdominal: Soft. Bowel sounds are normal. Exhibits no distension and no mass. There is no tenderness.  Musculoskeletal: Normal range of motion. Exhibits no edema.  Lymphadenopathy:    No cervical adenopathy.  Neurological: Alert and oriented to person, place, and time. Exhibits for muscle wasting. Examined in the wheelchair.  Skin: Positive for wound on left shin with some mild surrounding erythema. No purulent discharge. Bandage on wound was dirty. Skin is warm and dry. No rash noted. Not diaphoretic. No pallor.  Psychiatric: Mood, memory and judgment normal.  Vitals reviewed.    LABORATORY DATA: Lab Results  Component Value Date   WBC 3.7 (L) 07/23/2022   HGB 8.5 (L) 07/23/2022   HCT 26.4 (L) 07/23/2022   MCV 95.0 07/23/2022   PLT 177 07/23/2022      Chemistry      Component Value Date/Time   NA 137 07/23/2022 0929   NA 140 09/28/2019 0948   K 3.7 07/23/2022 0929   K 3.5 03/19/2011 0722   CL 101 07/23/2022 0929   CO2 31 07/23/2022 0929   BUN 11 07/23/2022 0929   BUN 12 09/28/2019 0948   CREATININE  0.79 07/23/2022 0929      Component Value Date/Time   CALCIUM 9.1 07/23/2022 0929   ALKPHOS 67 07/23/2022 0929   AST 14 (L) 07/23/2022 0929   ALT 10 07/23/2022 0929   BILITOT 0.3 07/23/2022 0929       RADIOGRAPHIC STUDIES:  CT CHEST ABDOMEN PELVIS W CONTRAST  Result Date: 07/12/2022 CLINICAL DATA:  History of small cell lung cancer. Constipation and diarrhea 4 days. EXAM: CT CHEST, ABDOMEN, AND PELVIS WITH CONTRAST TECHNIQUE: Multidetector CT imaging of the chest, abdomen and pelvis was performed following the standard protocol during bolus administration of intravenous contrast. RADIATION DOSE REDUCTION: This exam was performed according to the departmental dose-optimization program which includes automated exposure control, adjustment of the mA and/or kV  according to patient size and/or use of iterative reconstruction technique. CONTRAST:  OMNIPAQUE IOHEXOL 300 MG/ML  SOLN COMPARISON:  05/24/2022 FINDINGS: CT CHEST FINDINGS Cardiovascular: Right-sided Port-A-Cath with tip over the cavoatrial junction. Heart is normal size. Mild calcified plaque over the mitral valve annulus. Mild calcified plaque over the 3 vessel coronary arteries. Thoracic aorta is normal in caliber. Pulmonary arterial system is unremarkable. Remaining vascular structures are unremarkable. Mediastinum/Nodes: No significant mediastinal or hilar adenopathy. Few small subcentimeter mediastinal lymph nodes are unchanged. Calcified precarinal lymph node. Lungs/Pleura: Lungs are adequately inflated. There multiple small subcentimeter right lung nodules which are unchanged. Patchy peripheral opacities over the right lower lobe mostly unchanged although there is a new new subpleural 9 mm nodule over the posterior right lower lobe. Mild bilateral patchy subpleural hazy interstitial prominence unchanged. No effusion. Airways are normal. Musculoskeletal: Patchy sclerosis over the bony structures unchanged compatible with diffuse osseous metastatic disease. CT ABDOMEN PELVIS FINDINGS Hepatobiliary: Previous cholecystectomy. Stable subcentimeter hypodensity over the right lobe of the liver. Biliary tree is normal. Pancreas: Normal. Spleen: Normal. Adrenals/Urinary Tract: Adrenal glands are normal. Kidneys are normal in size without hydronephrosis or nephrolithiasis. Ureters and bladder are normal. Stomach/Bowel: Stomach and small bowel are normal. Appendix is normal. There is diverticulosis of the colon. Mild wall thickening of the rectum which may be seen with proctitis. Vascular/Lymphatic: Moderate calcified plaque over the abdominal aorta which is normal in caliber. Remaining vascular structures are unremarkable. No significant adenopathy. Reproductive: Previous hysterectomy. Other: No significant  free fluid. Musculoskeletal: Patchy sclerosis over the spine, iliac bones and sternum likely due to diffuse metastatic disease as this is not significantly changed. IMPRESSION: 1. Wall thickening of the rectum which may be seen with proctitis. 2. Multiple small subcentimeter right lung nodules which are unchanged. Patchy peripheral opacities over the right lower lobe unchanged with the exception of a new 9 mm subpleural nodule over the posterior right lower lobe. Recommend attention on follow-up. 3. Stable subcentimeter hypodensity over the right lobe of the liver. 4. Stable patchy subpleural interstitial changes. 5. Diverticulosis of the colon. 6. Diffuse osseous metastatic disease unchanged. 7. Aortic atherosclerosis. Atherosclerotic coronary artery disease. Aortic Atherosclerosis (ICD10-I70.0). Electronically Signed   By: Elberta Fortis M.D.   On: 07/12/2022 18:10     ASSESSMENT/PLAN:  This is a very pleasant 72 year old Caucasian female diagnosed with extensive stage (T4, N2, M1 C) small cell lung cancer.  The patient presented with a large right lower lobe consolidative mass in addition to right hilar mediastinal lymphadenopathy and extensive bony metastases.  The patient was diagnosed in November 2023.   Patient completed palliative radiotherapy to the left scapular region and right hip under the care of Dr. Roselind Messier which was completed on 01/02/2022.  The patient is currently undergoing palliative systemic chemotherapy with carboplatin for an AUC of 5 on day 1, etoposide 100 mg/m on days 1, 2, and 3 with Cosela 240 mg as well as Imfinzi 1500 mg on day 1.  The patient receives treatment IV every 3 weeks.  She is status post 8 cycles.  She does tolerate treatment well although she had cytopenias on labs which require supportive measures such as blood transfusions. Starting from cycle #5, she started maintenance immunotherapy with Imfinzi every 4 weeks.   She is currently on single agent  immunotherapy.   Labs reviewed. She continues to have stable anemia. She had heme positive stools in ER and scan showed proctitis. Dr. Arbutus Ped recommends following back up with GI. I will place referral. We will arrange for GI pathogen panel and C. difficile testing just to assess her diarrhea which only occurs once per day.  We also check iron studies at her next lab visit.   The patient recently had a restaging CT scan. The patient was seen with Dr. Arbutus Ped today.  Dr. Arbutus Ped personally and independently reviewed the scan and discussed results with the patient today.  The scan showed no evidence of disease progression septa a new 9 mm nodule which we will monitor closely.  Dr. Arbutus Ped recommends she continue on the same treatment at the same dose    As long as her labs are in parameters, she will proceed with cycle #9 with Imfinzi today scheduled.   Regarding her fatigue and weakness, this could be multifactorial secondary to deconditioning, anemia, and medication side effect as she is on several medications to make her drowsy. I previously ordered MRI of the brain to r/o metastatic disease to the brain which she can have performed at her earliest convenience. I have the number to the radiology department to the patient's daughter today.  I also sent a prescription for 2 tablets of Ativan due to her claustrophobia. Her brain MRI is negative for metastatic disease to the brain, we would recommend seeing her PCP for a dementia assessment.  For the wound on her shin which occurred after a fall, in the event that there is early infection, we will send the patient a prescription for Keflex and Bactroban.  Also discussed the importance of not wearing dirty bandages  She continues to lose weight.  She had evidence of thrush on her exam today.  She is advised to perform good oral hygiene with brushing her tongue.  I sent a prescription for Diflucan to the pharmacy.  Hopefully this will help her appetite and  taste alterations.  She is scheduled to see a member the nutritionist team while in the infusion room at her next appointment.  She was encouraged to increase her protein supplemental drink intake to 2 to 3/day.  We will see him back for a follow up visit in 4 weeks for evaluation and repeat blood work before starting cycle #10  The patient was advised to call immediately if she has any concerning symptoms in the interval. The patient voices understanding of current disease status and treatment options and is in agreement with the current care plan. All questions were answered. The patient knows to call the clinic with any problems, questions or concerns. We can certainly see the patient much sooner if necessary     Orders Placed This Encounter  Procedures   GI pathogen panel by PCR, stool    Standing Status:   Future    Standing Expiration  Date:   07/23/2023   C difficile quick screen w PCR reflex    Standing Status:   Future    Standing Expiration Date:   07/23/2023   CBC with Differential (Cancer Center Only)    Standing Status:   Future    Standing Expiration Date:   10/15/2023   CMP (Cancer Center only)    Standing Status:   Future    Standing Expiration Date:   10/15/2023   CBC with Differential (Cancer Center Only)    Standing Status:   Future    Standing Expiration Date:   11/12/2023   CMP (Cancer Center only)    Standing Status:   Future    Standing Expiration Date:   11/12/2023   CBC with Differential (Cancer Center Only)    Standing Status:   Future    Standing Expiration Date:   12/10/2023   CMP (Cancer Center only)    Standing Status:   Future    Standing Expiration Date:   12/10/2023   Iron and Iron Binding Capacity ([facility] only)    Standing Status:   Future    Standing Expiration Date:   07/23/2023   Ferritin    Standing Status:   Future    Standing Expiration Date:   07/23/2023   Ambulatory referral to Gastroenterology    Referral Priority:   Routine    Referral  Type:   Consultation    Referral Reason:   Specialty Services Required    Number of Visits Requested:   1     Aishah Teffeteller L Celenia Hruska, PA-C 07/23/22  ADDENDUM: Hematology/Oncology Attending: I had a face-to-face encounter with the patient today.  I reviewed her record, lab, scan and recommended her care plan.  This is a very pleasant 72 years old white female with extensive stage small cell lung cancer diagnosed in November 2023.  The patient started induction systemic chemotherapy initially with carboplatin, etoposide, Cosela and Imfinzi for 4 cycles with partial response.  She has been on maintenance treatment with single agent Imfinzi for additional 4 cycles and has been tolerating this treatment fairly well.  The patient came to the clinic today accompanied by her daughter and she is feeling fine except for the baseline fatigue and generalized weakness.  She had repeat CT scan of the chest, abdomen and pelvis performed recently.  I personally and independently reviewed the scan images and discussed the result with the patient and her daughter today. Her scan showed no concerning findings for disease progression except for new 0.9 cm subpleural nodule in the posterior right lower lobe that require attention on follow-up examination. I recommended for the patient to continue her maintenance treatment with Imfinzi and she will proceed with cycle #9 today. For the dizzy spells, we will order MRI of the brain to rule out any metastasis. The patient will come back for follow-up visit in 4 weeks for evaluation before the next cycle of her treatment. She was advised to call immediately if she has any other concerning symptoms in the interval. The total time spent in the appointment was 30 minutes. Disclaimer: This note was dictated with voice recognition software. Similar sounding words can inadvertently be transcribed and may be missed upon review. Lajuana Matte, MD

## 2022-07-22 ENCOUNTER — Ambulatory Visit: Payer: Medicare Other | Admitting: Cardiology

## 2022-07-23 ENCOUNTER — Inpatient Hospital Stay: Payer: Medicare Other | Admitting: Physician Assistant

## 2022-07-23 ENCOUNTER — Inpatient Hospital Stay: Payer: Medicare Other | Attending: Radiation Oncology

## 2022-07-23 ENCOUNTER — Inpatient Hospital Stay: Payer: Medicare Other

## 2022-07-23 ENCOUNTER — Other Ambulatory Visit: Payer: Self-pay

## 2022-07-23 VITALS — BP 144/72 | HR 93 | Resp 17

## 2022-07-23 VITALS — BP 127/66 | HR 100 | Temp 97.7°F | Resp 17 | Wt 160.7 lb

## 2022-07-23 DIAGNOSIS — Z79620 Long term (current) use of immunosuppressive biologic: Secondary | ICD-10-CM | POA: Diagnosis not present

## 2022-07-23 DIAGNOSIS — R195 Other fecal abnormalities: Secondary | ICD-10-CM

## 2022-07-23 DIAGNOSIS — C7951 Secondary malignant neoplasm of bone: Secondary | ICD-10-CM | POA: Insufficient documentation

## 2022-07-23 DIAGNOSIS — D649 Anemia, unspecified: Secondary | ICD-10-CM

## 2022-07-23 DIAGNOSIS — R197 Diarrhea, unspecified: Secondary | ICD-10-CM

## 2022-07-23 DIAGNOSIS — Z5112 Encounter for antineoplastic immunotherapy: Secondary | ICD-10-CM | POA: Diagnosis present

## 2022-07-23 DIAGNOSIS — C3431 Malignant neoplasm of lower lobe, right bronchus or lung: Secondary | ICD-10-CM | POA: Insufficient documentation

## 2022-07-23 DIAGNOSIS — B370 Candidal stomatitis: Secondary | ICD-10-CM | POA: Insufficient documentation

## 2022-07-23 DIAGNOSIS — C3491 Malignant neoplasm of unspecified part of right bronchus or lung: Secondary | ICD-10-CM

## 2022-07-23 DIAGNOSIS — T451X5A Adverse effect of antineoplastic and immunosuppressive drugs, initial encounter: Secondary | ICD-10-CM

## 2022-07-23 DIAGNOSIS — Z95828 Presence of other vascular implants and grafts: Secondary | ICD-10-CM

## 2022-07-23 DIAGNOSIS — L089 Local infection of the skin and subcutaneous tissue, unspecified: Secondary | ICD-10-CM

## 2022-07-23 LAB — CBC WITH DIFFERENTIAL (CANCER CENTER ONLY)
Abs Immature Granulocytes: 0.01 10*3/uL (ref 0.00–0.07)
Basophils Absolute: 0 10*3/uL (ref 0.0–0.1)
Basophils Relative: 1 %
Eosinophils Absolute: 0.2 10*3/uL (ref 0.0–0.5)
Eosinophils Relative: 5 %
HCT: 26.4 % — ABNORMAL LOW (ref 36.0–46.0)
Hemoglobin: 8.5 g/dL — ABNORMAL LOW (ref 12.0–15.0)
Immature Granulocytes: 0 %
Lymphocytes Relative: 19 %
Lymphs Abs: 0.7 10*3/uL (ref 0.7–4.0)
MCH: 30.6 pg (ref 26.0–34.0)
MCHC: 32.2 g/dL (ref 30.0–36.0)
MCV: 95 fL (ref 80.0–100.0)
Monocytes Absolute: 0.3 10*3/uL (ref 0.1–1.0)
Monocytes Relative: 8 %
Neutro Abs: 2.5 10*3/uL (ref 1.7–7.7)
Neutrophils Relative %: 67 %
Platelet Count: 177 10*3/uL (ref 150–400)
RBC: 2.78 MIL/uL — ABNORMAL LOW (ref 3.87–5.11)
RDW: 15.8 % — ABNORMAL HIGH (ref 11.5–15.5)
WBC Count: 3.7 10*3/uL — ABNORMAL LOW (ref 4.0–10.5)
nRBC: 0 % (ref 0.0–0.2)

## 2022-07-23 LAB — SAMPLE TO BLOOD BANK

## 2022-07-23 LAB — CMP (CANCER CENTER ONLY)
ALT: 10 U/L (ref 0–44)
AST: 14 U/L — ABNORMAL LOW (ref 15–41)
Albumin: 3.5 g/dL (ref 3.5–5.0)
Alkaline Phosphatase: 67 U/L (ref 38–126)
Anion gap: 5 (ref 5–15)
BUN: 11 mg/dL (ref 8–23)
CO2: 31 mmol/L (ref 22–32)
Calcium: 9.1 mg/dL (ref 8.9–10.3)
Chloride: 101 mmol/L (ref 98–111)
Creatinine: 0.79 mg/dL (ref 0.44–1.00)
GFR, Estimated: >60 mL/min (ref >60)
Glucose, Bld: 84 mg/dL (ref 70–99)
Potassium: 3.7 mmol/L (ref 3.5–5.1)
Sodium: 137 mmol/L (ref 135–145)
Total Bilirubin: 0.3 mg/dL (ref 0.3–1.2)
Total Protein: 6.7 g/dL (ref 6.5–8.1)

## 2022-07-23 LAB — TSH: TSH: 5.763 u[IU]/mL — ABNORMAL HIGH (ref 0.350–4.500)

## 2022-07-23 MED ORDER — MUPIROCIN 2 % EX OINT
1.0000 | TOPICAL_OINTMENT | Freq: Two times a day (BID) | CUTANEOUS | 0 refills | Status: DC
Start: 2022-07-23 — End: 2022-08-20

## 2022-07-23 MED ORDER — HEPARIN SOD (PORK) LOCK FLUSH 100 UNIT/ML IV SOLN
500.0000 [IU] | Freq: Once | INTRAVENOUS | Status: AC | PRN
  Administered 2022-07-23: 500 [IU]

## 2022-07-23 MED ORDER — SODIUM CHLORIDE 0.9 % IV SOLN: Freq: Once | INTRAVENOUS | Status: AC

## 2022-07-23 MED ORDER — CEPHALEXIN 500 MG PO CAPS
500.0000 mg | ORAL_CAPSULE | Freq: Four times a day (QID) | ORAL | 0 refills | Status: DC
Start: 2022-07-23 — End: 2022-08-01

## 2022-07-23 MED ORDER — SODIUM CHLORIDE 0.9 % IV SOLN
1500.0000 mg | Freq: Once | INTRAVENOUS | Status: AC
  Administered 2022-07-23: 1500 mg via INTRAVENOUS
  Filled 2022-07-23: qty 30

## 2022-07-23 MED ORDER — SODIUM CHLORIDE 0.9% FLUSH
10.0000 mL | Freq: Once | INTRAVENOUS | Status: AC
  Administered 2022-07-23: 10 mL

## 2022-07-23 MED ORDER — FLUCONAZOLE 100 MG PO TABS
100.0000 mg | ORAL_TABLET | Freq: Every day | ORAL | 0 refills | Status: DC
Start: 2022-07-23 — End: 2022-08-01

## 2022-07-23 MED ORDER — SODIUM CHLORIDE 0.9% FLUSH
10.0000 mL | INTRAVENOUS | Status: DC | PRN
  Administered 2022-07-23: 10 mL

## 2022-07-23 MED ORDER — LORAZEPAM 0.5 MG PO TABS
ORAL_TABLET | ORAL | 0 refills | Status: DC
Start: 2022-07-23 — End: 2022-08-20

## 2022-07-23 NOTE — Patient Instructions (Signed)
[facility]  Discharge Instructions: Thank you for choosing [facility] to provide your oncology and hematology care.   If you have a lab appointment with the Cancer Center, please go directly to the Cancer Center and check in at the registration area.   Wear comfortable clothing and clothing appropriate for easy access to any Portacath or PICC line.   We strive to give you quality time with your provider. You may need to reschedule your appointment if you arrive late (15 or more minutes).  Arriving late affects you and other patients whose appointments are after yours.  Also, if you miss three or more appointments without notifying the office, you may be dismissed from the clinic at the provider's discretion.      For prescription refill requests, have your pharmacy contact our office and allow 72 hours for refills to be completed.    Today you received the following chemotherapy and/or immunotherapy agents: Imfinzi      To help prevent nausea and vomiting after your treatment, we encourage you to take your nausea medication as directed.  BELOW ARE SYMPTOMS THAT SHOULD BE REPORTED IMMEDIATELY: *FEVER GREATER THAN 100.4 F (38 C) OR HIGHER *CHILLS OR SWEATING *NAUSEA AND VOMITING THAT IS NOT CONTROLLED WITH YOUR NAUSEA MEDICATION *UNUSUAL SHORTNESS OF BREATH *UNUSUAL BRUISING OR BLEEDING *URINARY PROBLEMS (pain or burning when urinating, or frequent urination) *BOWEL PROBLEMS (unusual diarrhea, constipation, pain near the anus) TENDERNESS IN MOUTH AND THROAT WITH OR WITHOUT PRESENCE OF ULCERS (sore throat, sores in mouth, or a toothache) UNUSUAL RASH, SWELLING OR PAIN  UNUSUAL VAGINAL DISCHARGE OR ITCHING   Items with * indicate a potential emergency and should be followed up as soon as possible or go to the Emergency Department if any problems should occur.  Please show the CHEMOTHERAPY ALERT CARD or IMMUNOTHERAPY ALERT CARD at  check-in to the Emergency Department and triage nurse.  Should you have questions after your visit or need to cancel or reschedule your appointment, please contact [facility]  Dept: [phone]  and follow the prompts.  Office hours are 8:00 a.m. to 4:30 p.m. Monday - Friday. Please note that voicemails left after 4:00 p.m. may not be returned until the following business day.  We are closed weekends and major holidays. You have access to a nurse at all times for urgent questions. Please call the main number to the clinic Dept: [phone] and follow the prompts.   For any non-urgent questions, you may also contact your provider using MyChart. We now offer e-Visits for anyone 18 and older to request care online for non-urgent symptoms. For details visit [email or web address].   Also download the MyChart app! Go to the app store, search "MyChart", open the app, select [facility], and log in with your MyChart username and password.   

## 2022-07-24 ENCOUNTER — Other Ambulatory Visit: Payer: Self-pay

## 2022-07-27 ENCOUNTER — Other Ambulatory Visit: Payer: Self-pay

## 2022-07-29 ENCOUNTER — Encounter (HOSPITAL_COMMUNITY): Payer: Self-pay

## 2022-07-29 ENCOUNTER — Other Ambulatory Visit: Payer: Self-pay

## 2022-07-29 ENCOUNTER — Inpatient Hospital Stay (HOSPITAL_COMMUNITY)
Admission: EM | Admit: 2022-07-29 | Discharge: 2022-08-01 | DRG: 392 | Disposition: A | Discharge status: Home / Self Care | Payer: Medicare Other | Attending: Internal Medicine | Admitting: Internal Medicine

## 2022-07-29 ENCOUNTER — Emergency Department (HOSPITAL_COMMUNITY): Payer: Medicare Other

## 2022-07-29 ENCOUNTER — Telehealth: Payer: Self-pay | Admitting: Physician Assistant

## 2022-07-29 DIAGNOSIS — H9193 Unspecified hearing loss, bilateral: Secondary | ICD-10-CM | POA: Diagnosis present

## 2022-07-29 DIAGNOSIS — R7303 Prediabetes: Secondary | ICD-10-CM | POA: Diagnosis present

## 2022-07-29 DIAGNOSIS — K3184 Gastroparesis: Secondary | ICD-10-CM | POA: Diagnosis present

## 2022-07-29 DIAGNOSIS — Z923 Personal history of irradiation: Secondary | ICD-10-CM

## 2022-07-29 DIAGNOSIS — F17210 Nicotine dependence, cigarettes, uncomplicated: Secondary | ICD-10-CM | POA: Diagnosis present

## 2022-07-29 DIAGNOSIS — Z8249 Family history of ischemic heart disease and other diseases of the circulatory system: Secondary | ICD-10-CM

## 2022-07-29 DIAGNOSIS — K219 Gastro-esophageal reflux disease without esophagitis: Secondary | ICD-10-CM | POA: Diagnosis present

## 2022-07-29 DIAGNOSIS — G5600 Carpal tunnel syndrome, unspecified upper limb: Secondary | ICD-10-CM | POA: Diagnosis present

## 2022-07-29 DIAGNOSIS — F419 Anxiety disorder, unspecified: Secondary | ICD-10-CM | POA: Diagnosis present

## 2022-07-29 DIAGNOSIS — K5289 Other specified noninfective gastroenteritis and colitis: Secondary | ICD-10-CM | POA: Diagnosis present

## 2022-07-29 DIAGNOSIS — G8929 Other chronic pain: Secondary | ICD-10-CM | POA: Diagnosis present

## 2022-07-29 DIAGNOSIS — E871 Hypo-osmolality and hyponatremia: Secondary | ICD-10-CM | POA: Diagnosis present

## 2022-07-29 DIAGNOSIS — C3490 Malignant neoplasm of unspecified part of unspecified bronchus or lung: Secondary | ICD-10-CM | POA: Diagnosis present

## 2022-07-29 DIAGNOSIS — M19042 Primary osteoarthritis, left hand: Secondary | ICD-10-CM | POA: Diagnosis present

## 2022-07-29 DIAGNOSIS — G4733 Obstructive sleep apnea (adult) (pediatric): Secondary | ICD-10-CM | POA: Diagnosis present

## 2022-07-29 DIAGNOSIS — M549 Dorsalgia, unspecified: Secondary | ICD-10-CM | POA: Diagnosis present

## 2022-07-29 DIAGNOSIS — R1032 Left lower quadrant pain: Secondary | ICD-10-CM

## 2022-07-29 DIAGNOSIS — I700 Atherosclerosis of aorta: Secondary | ICD-10-CM | POA: Diagnosis present

## 2022-07-29 DIAGNOSIS — Z8541 Personal history of malignant neoplasm of cervix uteri: Secondary | ICD-10-CM

## 2022-07-29 DIAGNOSIS — I872 Venous insufficiency (chronic) (peripheral): Secondary | ICD-10-CM | POA: Diagnosis present

## 2022-07-29 DIAGNOSIS — K5900 Constipation, unspecified: Secondary | ICD-10-CM | POA: Diagnosis present

## 2022-07-29 DIAGNOSIS — D500 Iron deficiency anemia secondary to blood loss (chronic): Secondary | ICD-10-CM | POA: Diagnosis present

## 2022-07-29 DIAGNOSIS — I7300 Raynaud's syndrome without gangrene: Secondary | ICD-10-CM | POA: Diagnosis present

## 2022-07-29 DIAGNOSIS — Z91030 Bee allergy status: Secondary | ICD-10-CM

## 2022-07-29 DIAGNOSIS — Z86010 Personal history of colonic polyps: Secondary | ICD-10-CM

## 2022-07-29 DIAGNOSIS — J449 Chronic obstructive pulmonary disease, unspecified: Secondary | ICD-10-CM | POA: Diagnosis present

## 2022-07-29 DIAGNOSIS — Z9842 Cataract extraction status, left eye: Secondary | ICD-10-CM

## 2022-07-29 DIAGNOSIS — Z885 Allergy status to narcotic agent status: Secondary | ICD-10-CM

## 2022-07-29 DIAGNOSIS — D696 Thrombocytopenia, unspecified: Secondary | ICD-10-CM | POA: Diagnosis present

## 2022-07-29 DIAGNOSIS — I10 Essential (primary) hypertension: Secondary | ICD-10-CM | POA: Diagnosis present

## 2022-07-29 DIAGNOSIS — Z7982 Long term (current) use of aspirin: Secondary | ICD-10-CM

## 2022-07-29 DIAGNOSIS — Z79899 Other long term (current) drug therapy: Secondary | ICD-10-CM

## 2022-07-29 DIAGNOSIS — Z720 Tobacco use: Secondary | ICD-10-CM | POA: Diagnosis present

## 2022-07-29 DIAGNOSIS — M19041 Primary osteoarthritis, right hand: Secondary | ICD-10-CM | POA: Diagnosis present

## 2022-07-29 DIAGNOSIS — K5792 Diverticulitis of intestine, part unspecified, without perforation or abscess without bleeding: Secondary | ICD-10-CM | POA: Diagnosis not present

## 2022-07-29 DIAGNOSIS — Z9109 Other allergy status, other than to drugs and biological substances: Secondary | ICD-10-CM

## 2022-07-29 DIAGNOSIS — Z96651 Presence of right artificial knee joint: Secondary | ICD-10-CM | POA: Diagnosis present

## 2022-07-29 DIAGNOSIS — Z8701 Personal history of pneumonia (recurrent): Secondary | ICD-10-CM

## 2022-07-29 DIAGNOSIS — Z96642 Presence of left artificial hip joint: Secondary | ICD-10-CM | POA: Diagnosis present

## 2022-07-29 DIAGNOSIS — Z8673 Personal history of transient ischemic attack (TIA), and cerebral infarction without residual deficits: Secondary | ICD-10-CM

## 2022-07-29 DIAGNOSIS — G43909 Migraine, unspecified, not intractable, without status migrainosus: Secondary | ICD-10-CM | POA: Diagnosis present

## 2022-07-29 DIAGNOSIS — E785 Hyperlipidemia, unspecified: Secondary | ICD-10-CM | POA: Diagnosis present

## 2022-07-29 DIAGNOSIS — K5730 Diverticulosis of large intestine without perforation or abscess without bleeding: Secondary | ICD-10-CM | POA: Diagnosis present

## 2022-07-29 DIAGNOSIS — D649 Anemia, unspecified: Secondary | ICD-10-CM | POA: Diagnosis not present

## 2022-07-29 DIAGNOSIS — Z823 Family history of stroke: Secondary | ICD-10-CM

## 2022-07-29 DIAGNOSIS — D638 Anemia in other chronic diseases classified elsewhere: Secondary | ICD-10-CM | POA: Diagnosis present

## 2022-07-29 DIAGNOSIS — K5909 Other constipation: Secondary | ICD-10-CM | POA: Diagnosis present

## 2022-07-29 DIAGNOSIS — Z83438 Family history of other disorder of lipoprotein metabolism and other lipidemia: Secondary | ICD-10-CM

## 2022-07-29 DIAGNOSIS — Z888 Allergy status to other drugs, medicaments and biological substances status: Secondary | ICD-10-CM

## 2022-07-29 DIAGNOSIS — Z9841 Cataract extraction status, right eye: Secondary | ICD-10-CM

## 2022-07-29 DIAGNOSIS — E876 Hypokalemia: Secondary | ICD-10-CM | POA: Diagnosis present

## 2022-07-29 DIAGNOSIS — Z882 Allergy status to sulfonamides status: Secondary | ICD-10-CM

## 2022-07-29 DIAGNOSIS — G2581 Restless legs syndrome: Secondary | ICD-10-CM | POA: Diagnosis present

## 2022-07-29 DIAGNOSIS — Z818 Family history of other mental and behavioral disorders: Secondary | ICD-10-CM

## 2022-07-29 DIAGNOSIS — I8390 Asymptomatic varicose veins of unspecified lower extremity: Secondary | ICD-10-CM | POA: Diagnosis present

## 2022-07-29 DIAGNOSIS — Z90710 Acquired absence of both cervix and uterus: Secondary | ICD-10-CM

## 2022-07-29 DIAGNOSIS — K649 Unspecified hemorrhoids: Secondary | ICD-10-CM | POA: Diagnosis present

## 2022-07-29 DIAGNOSIS — Z9851 Tubal ligation status: Secondary | ICD-10-CM

## 2022-07-29 DIAGNOSIS — F32A Depression, unspecified: Secondary | ICD-10-CM | POA: Diagnosis present

## 2022-07-29 LAB — URINALYSIS, W/ REFLEX TO CULTURE (INFECTION SUSPECTED)
Bacteria, UA: NONE SEEN
Bilirubin Urine: NEGATIVE
Glucose, UA: NEGATIVE mg/dL
Ketones, ur: NEGATIVE mg/dL
Leukocytes,Ua: NEGATIVE
Nitrite: NEGATIVE
Protein, ur: NEGATIVE mg/dL
Specific Gravity, Urine: 1.016 (ref 1.005–1.030)
pH: 6 (ref 5.0–8.0)

## 2022-07-29 LAB — COMPREHENSIVE METABOLIC PANEL
ALT: 15 U/L (ref 0–44)
AST: 18 U/L (ref 15–41)
Albumin: 3.4 g/dL — ABNORMAL LOW (ref 3.5–5.0)
Alkaline Phosphatase: 69 U/L (ref 38–126)
Anion gap: 7 (ref 5–15)
BUN: 10 mg/dL (ref 8–23)
CO2: 27 mmol/L (ref 22–32)
Calcium: 8.8 mg/dL — ABNORMAL LOW (ref 8.9–10.3)
Chloride: 98 mmol/L (ref 98–111)
Creatinine, Ser: 0.79 mg/dL (ref 0.44–1.00)
GFR, Estimated: >60 mL/min (ref >60)
Glucose, Bld: 114 mg/dL — ABNORMAL HIGH (ref 70–99)
Potassium: 3.4 mmol/L — ABNORMAL LOW (ref 3.5–5.1)
Sodium: 132 mmol/L — ABNORMAL LOW (ref 135–145)
Total Bilirubin: 0.5 mg/dL (ref 0.3–1.2)
Total Protein: 6.9 g/dL (ref 6.5–8.1)

## 2022-07-29 LAB — CBC WITH DIFFERENTIAL/PLATELET
Abs Immature Granulocytes: 0.02 10*3/uL (ref 0.00–0.07)
Basophils Absolute: 0 10*3/uL (ref 0.0–0.1)
Basophils Relative: 0 %
Eosinophils Absolute: 0.1 10*3/uL (ref 0.0–0.5)
Eosinophils Relative: 2 %
HCT: 27.6 % — ABNORMAL LOW (ref 36.0–46.0)
Hemoglobin: 8.6 g/dL — ABNORMAL LOW (ref 12.0–15.0)
Immature Granulocytes: 0 %
Lymphocytes Relative: 13 %
Lymphs Abs: 0.6 10*3/uL — ABNORMAL LOW (ref 0.7–4.0)
MCH: 29.6 pg (ref 26.0–34.0)
MCHC: 31.2 g/dL (ref 30.0–36.0)
MCV: 94.8 fL (ref 80.0–100.0)
Monocytes Absolute: 0.3 10*3/uL (ref 0.1–1.0)
Monocytes Relative: 6 %
Neutro Abs: 3.5 10*3/uL (ref 1.7–7.7)
Neutrophils Relative %: 79 %
Platelets: 171 10*3/uL (ref 150–400)
RBC: 2.91 MIL/uL — ABNORMAL LOW (ref 3.87–5.11)
RDW: 15.9 % — ABNORMAL HIGH (ref 11.5–15.5)
WBC: 4.5 10*3/uL (ref 4.0–10.5)
nRBC: 0 % (ref 0.0–0.2)

## 2022-07-29 LAB — MAGNESIUM: Magnesium: 1.7 mg/dL (ref 1.7–2.4)

## 2022-07-29 LAB — LIPASE, BLOOD: Lipase: 25 U/L (ref 11–51)

## 2022-07-29 MED ORDER — HYDROMORPHONE HCL 1 MG/ML IJ SOLN
1.0000 mg | INTRAMUSCULAR | Status: DC | PRN
  Administered 2022-08-01: 1 mg via INTRAVENOUS
  Filled 2022-07-29: qty 1

## 2022-07-29 MED ORDER — ONDANSETRON HCL 4 MG PO TABS: 4.0000 mg | ORAL_TABLET | Freq: Four times a day (QID) | ORAL | Status: DC | PRN

## 2022-07-29 MED ORDER — IOHEXOL 300 MG/ML  SOLN
100.0000 mL | Freq: Once | INTRAMUSCULAR | Status: AC | PRN
  Administered 2022-07-29: 100 mL via INTRAVENOUS

## 2022-07-29 MED ORDER — SODIUM CHLORIDE 0.9 % IV BOLUS
1000.0000 mL | Freq: Once | INTRAVENOUS | Status: AC
  Administered 2022-07-29: 1000 mL via INTRAVENOUS

## 2022-07-29 MED ORDER — MAGNESIUM SULFATE 2 GM/50ML IV SOLN
2.0000 g | Freq: Once | INTRAVENOUS | Status: AC
  Administered 2022-07-29: 2 g via INTRAVENOUS
  Filled 2022-07-29: qty 50

## 2022-07-29 MED ORDER — ACETAMINOPHEN 325 MG PO TABS: 650.0000 mg | ORAL_TABLET | Freq: Four times a day (QID) | ORAL | Status: DC | PRN

## 2022-07-29 MED ORDER — SODIUM CHLORIDE 0.9 % IV SOLN
2.0000 g | Freq: Once | INTRAVENOUS | Status: AC
  Administered 2022-07-29: 2 g via INTRAVENOUS
  Filled 2022-07-29: qty 20

## 2022-07-29 MED ORDER — ROSUVASTATIN CALCIUM 10 MG PO TABS
10.0000 mg | ORAL_TABLET | Freq: Every day | ORAL | Status: DC
  Administered 2022-07-29 – 2022-07-31 (×3): 10 mg via ORAL
  Filled 2022-07-29 (×3): qty 1

## 2022-07-29 MED ORDER — ACETAMINOPHEN 650 MG RE SUPP: 650.0000 mg | Freq: Four times a day (QID) | RECTAL | Status: DC | PRN

## 2022-07-29 MED ORDER — OXYCODONE HCL 5 MG PO TABS
10.0000 mg | ORAL_TABLET | Freq: Four times a day (QID) | ORAL | Status: DC | PRN
  Administered 2022-07-30 – 2022-08-01 (×7): 10 mg via ORAL
  Filled 2022-07-29 (×7): qty 2

## 2022-07-29 MED ORDER — DULOXETINE HCL 60 MG PO CPEP
60.0000 mg | ORAL_CAPSULE | Freq: Two times a day (BID) | ORAL | Status: DC
  Administered 2022-07-29 – 2022-08-01 (×6): 60 mg via ORAL
  Filled 2022-07-29 (×6): qty 1

## 2022-07-29 MED ORDER — POTASSIUM CHLORIDE IN NACL 20-0.9 MEQ/L-% IV SOLN
INTRAVENOUS | Status: AC
  Filled 2022-07-29 (×5): qty 1000

## 2022-07-29 MED ORDER — METOPROLOL SUCCINATE ER 25 MG PO TB24
25.0000 mg | ORAL_TABLET | Freq: Every day | ORAL | Status: DC
  Administered 2022-07-29 – 2022-08-01 (×4): 25 mg via ORAL
  Filled 2022-07-29 (×4): qty 1

## 2022-07-29 MED ORDER — SODIUM CHLORIDE 0.9 % IV BOLUS
500.0000 mL | Freq: Once | INTRAVENOUS | Status: AC
  Administered 2022-07-29: 500 mL via INTRAVENOUS

## 2022-07-29 MED ORDER — ASPIRIN 81 MG PO TBEC
81.0000 mg | DELAYED_RELEASE_TABLET | Freq: Every day | ORAL | Status: DC
  Administered 2022-07-30 – 2022-08-01 (×3): 81 mg via ORAL
  Filled 2022-07-29 (×3): qty 1

## 2022-07-29 MED ORDER — METOCLOPRAMIDE HCL 5 MG/ML IJ SOLN
5.0000 mg | Freq: Four times a day (QID) | INTRAMUSCULAR | Status: DC
  Administered 2022-07-30 – 2022-08-01 (×8): 5 mg via INTRAVENOUS
  Filled 2022-07-29 (×11): qty 2

## 2022-07-29 MED ORDER — SENNA 8.6 MG PO TABS
17.2000 mg | ORAL_TABLET | Freq: Every day | ORAL | Status: DC | PRN
  Administered 2022-07-29 – 2022-07-31 (×2): 17.2 mg via ORAL
  Filled 2022-07-29 (×3): qty 2

## 2022-07-29 MED ORDER — SODIUM CHLORIDE 0.9 % IV SOLN
2.0000 g | INTRAVENOUS | Status: DC
  Administered 2022-07-30 – 2022-07-31 (×2): 2 g via INTRAVENOUS
  Filled 2022-07-29 (×2): qty 20

## 2022-07-29 MED ORDER — ONDANSETRON HCL 4 MG/2ML IJ SOLN: 4.0000 mg | Freq: Four times a day (QID) | INTRAMUSCULAR | Status: DC | PRN

## 2022-07-29 MED ORDER — PANTOPRAZOLE SODIUM 40 MG PO TBEC: 40.0000 mg | DELAYED_RELEASE_TABLET | Freq: Every day | ORAL | Status: DC | PRN

## 2022-07-29 MED ORDER — TRAZODONE HCL 50 MG PO TABS
50.0000 mg | ORAL_TABLET | Freq: Every day | ORAL | Status: DC
  Administered 2022-07-29 – 2022-07-31 (×3): 50 mg via ORAL
  Filled 2022-07-29 (×3): qty 1

## 2022-07-29 MED ORDER — HYDROMORPHONE HCL 1 MG/ML IJ SOLN: 1.0000 mg | Freq: Four times a day (QID) | INTRAMUSCULAR | Status: DC | PRN

## 2022-07-29 MED ORDER — ALBUTEROL SULFATE (2.5 MG/3ML) 0.083% IN NEBU: 3.0000 mL | INHALATION_SOLUTION | RESPIRATORY_TRACT | Status: DC | PRN

## 2022-07-29 MED ORDER — POTASSIUM CHLORIDE CRYS ER 20 MEQ PO TBCR
40.0000 meq | EXTENDED_RELEASE_TABLET | Freq: Once | ORAL | Status: AC
  Administered 2022-07-29: 40 meq via ORAL
  Filled 2022-07-29: qty 2

## 2022-07-29 MED ORDER — METRONIDAZOLE 500 MG/100ML IV SOLN
500.0000 mg | Freq: Two times a day (BID) | INTRAVENOUS | Status: DC
  Administered 2022-07-30 – 2022-08-01 (×5): 500 mg via INTRAVENOUS
  Filled 2022-07-29 (×5): qty 100

## 2022-07-29 MED ORDER — ROPINIROLE HCL 1 MG PO TABS
2.0000 mg | ORAL_TABLET | Freq: Every day | ORAL | Status: DC
  Administered 2022-07-29 – 2022-07-31 (×3): 2 mg via ORAL
  Filled 2022-07-29 (×3): qty 2

## 2022-07-29 MED ORDER — METRONIDAZOLE 500 MG/100ML IV SOLN
500.0000 mg | Freq: Once | INTRAVENOUS | Status: AC
  Administered 2022-07-29: 500 mg via INTRAVENOUS
  Filled 2022-07-29: qty 100

## 2022-07-29 MED ORDER — ROPINIROLE HCL 1 MG PO TABS
1.0000 mg | ORAL_TABLET | Freq: Every day | ORAL | Status: DC
  Administered 2022-07-30 – 2022-08-01 (×3): 1 mg via ORAL
  Filled 2022-07-29 (×3): qty 1

## 2022-07-29 NOTE — ED Notes (Signed)
ED TO INPATIENT HANDOFF REPORT  ED Nurse Name and Phone #: Linus Orn Name/Age/Gender [name] 72 y.o. female Room/Bed: WA06/WA06  Code Status   Code Status: Prior  Home/SNF/Other Home Patient oriented to: self, place, time, and situation Is this baseline? Yes   Triage Complete: Triage complete  Chief Complaint Acute diverticulitis [K57.92]  Triage Note No notes on file   Allergies Allergies  Allergen Reactions   Bee Venom Anaphylaxis   Morphine And Codeine Other (See Comments)    Overly sedated   Neurontin [Gabapentin] Other (See Comments)    Nightmares    Codeine Hives   Adhesive [Tape] Itching and Other (See Comments)    Redness, Paper tape is ok   Sulfa Antibiotics Other (See Comments)    Childhood allergy Unknown reaction   Sulfonamide Derivatives Other (See Comments)    Childhood allergy Unknown reaction    Level of Care/Admitting Diagnosis ED Disposition     ED Disposition  Admit   Condition  --   Comment  Hospital Area: [facility] [[id number]]  Level of Care: Med-Surg [16]  May admit patient to [facility] or [facility] if equivalent level of care is available:: No  Covid Evaluation: Asymptomatic - no recent exposure (last 10 days) testing not required  Diagnosis: Acute diverticulitis [[id number]]  Admitting Physician: Bobette Mo [[id number]]  Attending Physician: Bobette Mo [[id number]]  Certification:: I certify this patient will need inpatient services for at least 2 midnights  Estimated Length of Stay: 2          B Medical/Surgery History Past Medical History:  Diagnosis Date   Anxiety    Aortic atherosclerosis (HCC)    Bilateral cataracts    Carpal tunnel syndrome    Cervical cancer (HCC)    Chronic back pain    Claudication (HCC)    COPD (chronic obstructive pulmonary disease) (HCC)    wheezing   DDD (degenerative disc disease), cervical    DDD (degenerative disc disease), lumbar     Depression    Diverticulosis    Dyspnea    on exertion   Gastroparesis    GERD (gastroesophageal reflux disease)    Hearing loss    Bilateral   History of blood transfusion    History of colon polyps    History of migraine    History of radiation therapy    Left scapula,left chest, right pelvis- 12/20/21-01/02/22- Dr. 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   rt   Urge incontinence of urine    Varicose vein of leg    Venous insufficiency    Past Surgical History:  Procedure Laterality Date   BACK SURGERY     BIOPSY  08/31/2021   Procedure: BIOPSY;  Surgeon: Tressia Danas, MD;  Location: [facility]  ENDOSCOPY;  Service: Gastroenterology;;   BLEPHAROPLASTY  10/10/2017   BRONCHIAL BIOPSY  11/21/2021   Procedure: BRONCHIAL BIOPSIES;  Surgeon: Oretha Milch, MD;  Location: [facility] ENDOSCOPY;  Service: Cardiopulmonary;;   BRONCHIAL BRUSHINGS  11/21/2021   Procedure: BRONCHIAL BRUSHINGS;  Surgeon: Oretha Milch, MD;  Location: [facility] ENDOSCOPY;  Service: Cardiopulmonary;;   BRONCHIAL NEEDLE ASPIRATION BIOPSY  11/21/2021   Procedure: BRONCHIAL NEEDLE ASPIRATION BIOPSIES;  Surgeon: Oretha Milch, MD;  Location: [facility] ENDOSCOPY;  Service: Cardiopulmonary;;   BRONCHIAL WASHINGS  11/21/2021   Procedure: BRONCHIAL WASHINGS;  Surgeon: Oretha Milch, MD;  Location: [facility] ENDOSCOPY;  Service: Cardiopulmonary;;   CATARACT EXTRACTION, BILATERAL     CESAREAN SECTION     CHOLECYSTECTOMY     COLONOSCOPY  10/2016   COLONOSCOPY  WITH PROPOFOL N/A 08/31/2021   Procedure: COLONOSCOPY WITH PROPOFOL;  Surgeon: Tressia Danas, MD;  Location: [facility] ENDOSCOPY;  Service: Gastroenterology;  Laterality: N/A;   ESOPHAGOGASTRODUODENOSCOPY (EGD) WITH PROPOFOL N/A 08/31/2021   Procedure: ESOPHAGOGASTRODUODENOSCOPY (EGD) WITH PROPOFOL;  Surgeon: Tressia Danas, MD;  Location: [facility] ENDOSCOPY;  Service: Gastroenterology;  Laterality: N/A;   HAND SURGERY Bilateral    carpal tunnel   HEMORRHOID SURGERY     HEMOSTASIS CONTROL  11/21/2021   Procedure: HEMOSTASIS CONTROL;  Surgeon: Oretha Milch, MD;  Location: [facility] ENDOSCOPY;  Service: Cardiopulmonary;;   INTERSTIM IMPLANT PLACEMENT     IR IMAGING GUIDED PORT INSERTION  01/17/2022   KNEE SURGERY Left    arthroscopy x3   LOWER EXTREMITY ANGIOGRAPHY N/A 07/16/2016   Procedure: Lower Extremity Angiography;  Surgeon: Yates Decamp, MD;  Location: [facility] INVASIVE CV LAB;  Service: Cardiovascular;  Laterality: N/A;   LOWER EXTREMITY INTERVENTION N/A 08/06/2016   Procedure: Lower Extremity Intervention;  Surgeon: Yates Decamp, MD;  Location: [facility] INVASIVE CV LAB;  Service: Cardiovascular;  Laterality: N/A;   NECK SURGERY     OTHER SURGICAL HISTORY  2013   bladder stimulator in back   PERIPHERAL VASCULAR BALLOON ANGIOPLASTY  08/06/2016   Procedure: Peripheral Vascular Balloon Angioplasty;  Surgeon: Yates Decamp, MD;  Location: [facility] INVASIVE CV LAB;  Service: Cardiovascular;;  Right SFA   POLYPECTOMY  08/31/2021   Procedure: POLYPECTOMY;  Surgeon: Tressia Danas, MD;  Location: [facility] ENDOSCOPY;  Service: Gastroenterology;;   TENNIS ELBOW RELEASE/NIRSCHEL PROCEDURE Right 12/2015   TOE SURGERY Right    right foot second and fourth toe   TOTAL HIP ARTHROPLASTY Left    TOTAL HIP REVISION Left 10/13/2017   Procedure: LEFT TOTAL HIP REVISION ACETABULUM, OPEN REDUCTION INTERNAL WITH BONE GRAFT FIXATION LEFT GREATER TROCHANTERIC FRACTURE;  Surgeon: Gean Birchwood, MD;  Location: [facility] ORS;  Service: Orthopedics;  Laterality:  Left;   TOTAL KNEE ARTHROPLASTY Right 01/12/2016   Procedure: RIGHT TOTAL KNEE ARTHROPLASTY;  Surgeon: Beverely Low, MD;  Location: [facility] OR;  Service: Orthopedics;  Laterality: Right;   TUBAL LIGATION     UPPER GI ENDOSCOPY  10/2016   VAGINAL HYSTERECTOMY     VIDEO BRONCHOSCOPY WITH ENDOBRONCHIAL ULTRASOUND N/A 11/21/2021   Procedure: VIDEO BRONCHOSCOPY WITH ENDOBRONCHIAL ULTRASOUND;  Surgeon: Oretha Milch, MD;  Location: [facility] ENDOSCOPY;  Service: Cardiopulmonary;  Laterality: N/A;     A IV Location/Drains/Wounds Patient Lines/Drains/Airways Status     Active Line/Drains/Airways     Name Placement date Placement time Site Days   Implanted Port 01/17/22 Right Chest 01/17/22  1345  Chest  193   Peripheral IV 07/29/22 22 G Left Antecubital 07/29/22  1553  Antecubital  less than 1  Intake/Output Last 24 hours No intake or output data in the 24 hours ending 07/29/22 1737  Labs/Imaging Results for orders placed or performed during the hospital encounter of 07/29/22 (from the past 48 hour(s))  Comprehensive metabolic panel     Status: Abnormal   Collection Time: 07/29/22  3:56 PM  Result Value Ref Range   Sodium 132 (L) 135 - 145 mmol/L   Potassium 3.4 (L) 3.5 - 5.1 mmol/L   Chloride 98 98 - 111 mmol/L   CO2 27 22 - 32 mmol/L   Glucose, Bld 114 (H) 70 - 99 mg/dL    Comment: Glucose reference range applies only to samples taken after fasting for at least 8 hours.   BUN 10 8 - 23 mg/dL   Creatinine, Ser 1.61 0.44 - 1.00 mg/dL   Calcium 8.8 (L) 8.9 - 10.3 mg/dL   Total Protein 6.9 6.5 - 8.1 g/dL   Albumin 3.4 (L) 3.5 - 5.0 g/dL   AST 18 15 - 41 U/L   ALT 15 0 - 44 U/L   Alkaline Phosphatase 69 38 - 126 U/L   Total Bilirubin 0.5 0.3 - 1.2 mg/dL   GFR, Estimated >09 >60 mL/min    Comment: (NOTE) Calculated using the CKD-EPI Creatinine Equation (2021)    Anion gap 7 5 - 15    Comment: Performed at [facility], [address], [place], [place]  [address]  Lipase, blood     Status: None   Collection Time: 07/29/22  3:56 PM  Result Value Ref Range   Lipase 25 11 - 51 U/L    Comment: Performed at [facility], [address] [place], [place] [address]  CBC with Differential     Status: Abnormal   Collection Time: 07/29/22  3:56 PM  Result Value Ref Range   WBC 4.5 4.0 - 10.5 K/uL   RBC 2.91 (L) 3.87 - 5.11 MIL/uL   Hemoglobin 8.6 (L) 12.0 - 15.0 g/dL   HCT 47.8 (L) 29.5 - 62.1 %   MCV 94.8 80.0 - 100.0 fL   MCH 29.6 26.0 - 34.0 pg   MCHC 31.2 30.0 - 36.0 g/dL   RDW 30.8 (H) 65.7 - 84.6 %   Platelets 171 150 - 400 K/uL   nRBC 0.0 0.0 - 0.2 %   Neutrophils Relative % 79 %   Neutro Abs 3.5 1.7 - 7.7 K/uL   Lymphocytes Relative 13 %   Lymphs Abs 0.6 (L) 0.7 - 4.0 K/uL   Monocytes Relative 6 %   Monocytes Absolute 0.3 0.1 - 1.0 K/uL   Eosinophils Relative 2 %   Eosinophils Absolute 0.1 0.0 - 0.5 K/uL   Basophils Relative 0 %   Basophils Absolute 0.0 0.0 - 0.1 K/uL   Immature Granulocytes 0 %   Abs Immature Granulocytes 0.02 0.00 - 0.07 K/uL    Comment: Performed at [facility], [address] [place], [place] [address]   CT ABDOMEN PELVIS W CONTRAST  Result Date: 07/29/2022 CLINICAL DATA:  Left lower quadrant abdominal pain. EXAM: CT ABDOMEN AND PELVIS WITH CONTRAST TECHNIQUE: Multidetector CT imaging of the abdomen and pelvis was performed using the standard protocol following bolus administration of intravenous contrast. RADIATION DOSE REDUCTION: This exam was performed according to the departmental dose-optimization program which includes automated exposure control, adjustment of the mA and/or kV according to patient size and/or use of iterative reconstruction technique. CONTRAST:  OMNIPAQUE IOHEXOL 300 MG/ML  SOLN COMPARISON:  July 12, 2022 FINDINGS: Lower chest: Chronic interstitial lung changes. Previously noted minute pulmonary nodules in the right middle lobe are stable.  Hepatobiliary: No focal liver abnormality is seen. Status post cholecystectomy. No biliary dilatation. Pancreas: Unremarkable. No pancreatic ductal dilatation or surrounding inflammatory changes. Spleen: Normal in size without focal abnormality. Adrenals/Urinary Tract: Adrenal glands are unremarkable. Kidneys are normal, without renal calculi, focal lesion, or hydronephrosis. Bladder is unremarkable. Stomach/Bowel: Stomach is within normal limits. Appendix appears normal. No evidence of small bowel wall thickening, distention, or inflammatory changes. Extensive sigmoid colon diverticulitis. Circumferential thickening of the rectum. Vascular/Lymphatic: No significant vascular findings are present. No enlarged abdominal or pelvic lymph nodes. Reproductive: Status post hysterectomy. No adnexal masses. Other: No abdominal wall hernia or abnormality. No abdominopelvic ascites. Musculoskeletal: Stable sclerotic changes in the spine. Stable compression deformity of T12 vertebral body. IMPRESSION: 1. Extensive sigmoid colon diverticulitis. 2. Circumferential thickening of the rectum. Please correlate with clinical symptoms of proctitis versus internal hemorrhoids. 3. Stable sclerotic changes in the spine. 4. Stable compression deformity of T12 vertebral body. 5. Chronic interstitial lung changes. 6. Previously noted minute pulmonary nodules in the right middle lobe are stable. Electronically Signed   By: Ted Mcalpine M.D.   On: 07/29/2022 17:05    Pending Labs Unresulted Labs (From admission, onward)     Start     Ordered   07/29/22 1515  Urinalysis, w/ Reflex to Culture (Infection Suspected) -Urine, Clean Catch  Once,   URGENT       Question:  Specimen Source  Answer:  Urine, Clean Catch   07/29/22 1514            Vitals/Pain Today's Vitals   07/29/22 1515 07/29/22 1530 07/29/22 1600 07/29/22 1630  BP: 137/66 130/65 (!) 149/79 (!) 151/73  Pulse: 97 98 95 97  Resp: 16 16 (!) 23 19  Temp:       TempSrc:      SpO2: 94% 98% 100% 99%  Weight:      Height:      PainSc:        Isolation Precautions No active isolations  Medications Medications  cefTRIAXone (ROCEPHIN) 2 g in sodium chloride 0.9 % 100 mL IVPB (2 g Intravenous New Bag/Given 07/29/22 1723)    And  metroNIDAZOLE (FLAGYL) IVPB 500 mg (has no administration in time range)  sodium chloride 0.9 % bolus 1,000 mL (1,000 mLs Intravenous New Bag/Given 07/29/22 1558)  sodium chloride 0.9 % bolus 500 mL (500 mLs Intravenous New Bag/Given 07/29/22 1558)  iohexol (OMNIPAQUE) 300 MG/ML solution 100 mL (100 mLs Intravenous Contrast Given 07/29/22 1638)    Mobility walks with device     Focused Assessments GI assessment   R Recommendations: See Admitting Provider Note  Report given to:   Additional Notes: aaox4, ambulates with Cane. Still needs UA

## 2022-07-29 NOTE — ED Provider Notes (Signed)
[place] EMERGENCY DEPARTMENT AT [facility] Provider Note   CSN: [id number] Arrival date & time: 07/29/22  1443     History  Chief Complaint  Patient presents with   Constipation    [name] is a 72 y.o. female.  72 year old female with medical history detailed below presents for evaluation patient plaint of left lower quadrant abdominal pain and associated constipation.  Symptoms been ongoing for the last 4 to 5 days.  She denies fever.  She reports associated nausea without vomiting.  She denies vaginal bleeding, urinary symptoms, rectal bleeding.  She reports history of constipation in the past.  She reports that using Colace at home has not improved or controlled her symptoms.  She reports that with her constipation in the past she typically does not have abdominal pain.  The history is provided by the patient and medical records.       Home Medications Prior to Admission medications   Medication Sig Start Date End Date Taking? Authorizing Provider  acetaminophen (TYLENOL) 325 MG tablet Take 650 mg by mouth 2 (two) times daily as needed for headache.    [provider]  albuterol (PROVENTIL) (2.5 MG/3ML) 0.083% nebulizer solution Inhale 3 mLs into the lungs every 4 (four) hours as needed for wheezing or shortness of breath (cough). Patient taking differently: Inhale 3 mLs into the lungs 2 (two) times daily as needed for wheezing or shortness of breath (cough). 09/01/21   Sabino Dick, DO  albuterol (VENTOLIN HFA) 108 (90 Base) MCG/ACT inhaler Inhale 2 puffs into the lungs 2 (two) times daily as needed for wheezing or shortness of breath.    [provider]  aspirin EC (ASPIRIN 81) 81 MG tablet Take 1 tablet (81 mg total) by mouth daily. 03/28/22     busPIRone (BUSPAR) 5 MG tablet Take 1 tablet (5 mg total) by mouth 2 (two) times daily. 02/18/18   Jomarie Longs, MD  cephALEXin (KEFLEX) 500 MG capsule Take 1 capsule (500 mg total)  by mouth 4 (four) times daily. 07/23/22   Heilingoetter, Cassandra L, PA-C  Cholecalciferol (VITAMIN D-3 PO) Take 2,000 Units by mouth daily.    [provider]  cyanocobalamin (VITAMIN B12) 1000 MCG/ML injection Inject 1,000 mcg into the muscle every 30 (thirty) days. 09/11/21   [provider]  docusate sodium (COLACE) 100 MG capsule Take 200 mg by mouth daily as needed (constipation).    [provider]  DULoxetine (CYMBALTA) 60 MG capsule Take 2 capsules (120 mg total) by mouth daily. 03/28/22     DULoxetine (CYMBALTA) 60 MG capsule Take 2 capsules (120 mg total) by mouth daily. 07/02/22     fluconazole (DIFLUCAN) 100 MG tablet Take 1 tablet (100 mg total) by mouth daily. 07/23/22   Heilingoetter, Cassandra L, PA-C  hydrocortisone (ANUSOL-HC) 25 MG suppository Place 1 suppository (25 mg total) rectally 2 (two) times daily as needed for hemorrhoids or anal itching. Patient not taking: Reported on 05/28/2022 09/01/21   Sabino Dick, DO  lidocaine-prilocaine (EMLA) cream Apply 30 minutes before appointment 02/27/22   Heilingoetter, Cassandra L, PA-C  linaclotide (LINZESS) 290 MCG CAPS capsule Take 1 capsule (290 mcg total) by mouth daily before breakfast. 07/11/22   Unk Lightning, PA  LORazepam (ATIVAN) 0.5 MG tablet Take 1 tablet 30 minutes before your MRI 07/23/22   Heilingoetter, Cassandra L, PA-C  losartan (COZAAR) 25 MG tablet Take 25 mg by mouth daily. 10/08/21   [provider]  methocarbamol (ROBAXIN)  500 MG tablet Take 1,000 mg by mouth 2 (two) times daily. 05/28/22   [provider]  methocarbamol (ROBAXIN) 500 MG tablet Take 1 tablet (500 mg total) by mouth every 6 hours as needed. 07/02/22     metoCLOPramide (REGLAN) 5 MG tablet Take 1 tablet by mouth 3 times daily 20-30 minutes before meals NO FURTHER REFILLS UNTIL SEEN, NEEDS AND APPOINTMENT Patient taking differently: Take 5 mg by mouth 3 (three) times daily before meals. 02/19/21   Hilarie Fredrickson, MD  metoprolol succinate (TOPROL-XL) 25 MG 24 hr tablet TAKE 1 TABLET BY MOUTH DAILY. TAKE WITH OR IMMEDIATELY FOLLOWING A MEAL. 07/22/22   Yates Decamp, MD  mupirocin ointment (BACTROBAN) 2 % Apply 1 Application topically 2 (two) times daily. 07/23/22   Heilingoetter, Cassandra L, PA-C  nitroGLYCERIN (NITROSTAT) 0.4 MG SL tablet Place 1 tablet (0.4 mg total) under the tongue every 5 (five) minutes x 3 doses as needed for chest pain. Patient not taking: Reported on 05/28/2022 05/27/22   Yates Decamp, MD  ondansetron [facility]) 8 MG tablet Take 1 tablet (8 mg total) by mouth every 8 (eight) hours as needed for nausea or vomiting. 06/26/22   Heilingoetter, Cassandra L, PA-C  Oxycodone HCl 10 MG TABS Take 10 mg by mouth 5 (five) times daily as needed (for moderate pain). 09/13/21   [provider]  Oxycodone HCl 10 MG TABS Take 1 tablet (10 mg total) by mouth every 4 hours as needed for pain 07/02/22     pantoprazole (PROTONIX) 40 MG tablet Take 1 tablet (40 mg total) by mouth daily as needed (acid reflux). 04/30/22   Heilingoetter, Cassandra L, PA-C  potassium chloride SA (KLOR-CON M) 20 MEQ tablet Take 1 tablet (20 mEq total) by mouth 2 (two) times daily. 03/13/22   Heilingoetter, Cassandra L, PA-C  potassium chloride SA (KLOR-CON M) 20 MEQ tablet Take 1 tablet (20 mEq total) by mouth 2 (two) times daily. 05/28/22   Si Gaul, MD  prochlorperazine (COMPAZINE) 10 MG tablet Take 1 tablet (10 mg total) by mouth every 6 (six) hours as needed for nausea or vomiting. 06/26/22   Heilingoetter, Cassandra L, PA-C  rOPINIRole (REQUIP) 1 MG tablet Take 1 tablet (1 mg total) by mouth 3 (three) times daily as directed 03/28/22     rosuvastatin (CRESTOR) 10 MG tablet Take 10 mg by mouth at bedtime. 06/10/20   [provider]  senna (SENOKOT) 8.6 MG tablet Take 17.2 mg by mouth daily as needed for constipation.    [provider]  Tiotropium Bromide-Olodaterol (STIOLTO RESPIMAT) 2.5-2.5 MCG/ACT  AERS Inhale 2 puffs into the lungs daily. 11/09/21   Parrett, Virgel Bouquet, NP  traZODone (DESYREL) 50 MG tablet TAKE 0.5-1 TABLETS (25-50 MG TOTAL) BY MOUTH AT BEDTIME AS NEEDED FOR SLEEP. Patient taking differently: Take 50 mg by mouth at bedtime. 03/16/18   Jomarie Longs, MD      Allergies    Bee venom, Morphine and codeine, Neurontin [gabapentin], Codeine, Adhesive [tape], Sulfa antibiotics, and Sulfonamide derivatives    Review of Systems   Review of Systems  All other systems reviewed and are negative.   Physical Exam Updated Vital Signs BP (!) 151/73   Pulse 97   Temp 97.8 F (36.6 C) (Oral)   Resp 19   Ht 5' (1.524 m)   Wt 73.9 kg   SpO2 99%   BMI 31.83 kg/m  Physical Exam Vitals and nursing note reviewed.  Constitutional:  General: She is not in acute distress.    Appearance: Normal appearance. She is well-developed.  HENT:     Head: Normocephalic and atraumatic.  Eyes:     Conjunctiva/sclera: Conjunctivae normal.     Pupils: Pupils are equal, round, and reactive to light.  Cardiovascular:     Rate and Rhythm: Normal rate and regular rhythm.     Heart sounds: Normal heart sounds.  Pulmonary:     Effort: Pulmonary effort is normal. No respiratory distress.     Breath sounds: Normal breath sounds.  Abdominal:     General: There is no distension.     Palpations: Abdomen is soft.     Tenderness: There is abdominal tenderness.     Comments: Moderate tenderness with palpation to the left lower quadrant.  No rebound.  No guarding.  Musculoskeletal:        General: No deformity. Normal range of motion.     Cervical back: Normal range of motion and neck supple.  Skin:    General: Skin is warm and dry.  Neurological:     General: No focal deficit present.     Mental Status: She is alert and oriented to person, place, and time.     ED Results / Procedures / Treatments   Labs (all labs ordered are listed, but only abnormal results are displayed) Labs Reviewed   COMPREHENSIVE METABOLIC PANEL - Abnormal; Notable for the following components:      Result Value   Sodium 132 (*)    Potassium 3.4 (*)    Glucose, Bld 114 (*)    Calcium 8.8 (*)    Albumin 3.4 (*)    All other components within normal limits  CBC WITH DIFFERENTIAL/PLATELET - Abnormal; Notable for the following components:   RBC 2.91 (*)    Hemoglobin 8.6 (*)    HCT 27.6 (*)    RDW 15.9 (*)    Lymphs Abs 0.6 (*)    All other components within normal limits  LIPASE, BLOOD  URINALYSIS, W/ REFLEX TO CULTURE (INFECTION SUSPECTED)    EKG None  Radiology CT ABDOMEN PELVIS W CONTRAST  Result Date: 07/29/2022 CLINICAL DATA:  Left lower quadrant abdominal pain. EXAM: CT ABDOMEN AND PELVIS WITH CONTRAST TECHNIQUE: Multidetector CT imaging of the abdomen and pelvis was performed using the standard protocol following bolus administration of intravenous contrast. RADIATION DOSE REDUCTION: This exam was performed according to the departmental dose-optimization program which includes automated exposure control, adjustment of the mA and/or kV according to patient size and/or use of iterative reconstruction technique. CONTRAST:  OMNIPAQUE IOHEXOL 300 MG/ML  SOLN COMPARISON:  July 12, 2022 FINDINGS: Lower chest: Chronic interstitial lung changes. Previously noted minute pulmonary nodules in the right middle lobe are stable. Hepatobiliary: No focal liver abnormality is seen. Status post cholecystectomy. No biliary dilatation. Pancreas: Unremarkable. No pancreatic ductal dilatation or surrounding inflammatory changes. Spleen: Normal in size without focal abnormality. Adrenals/Urinary Tract: Adrenal glands are unremarkable. Kidneys are normal, without renal calculi, focal lesion, or hydronephrosis. Bladder is unremarkable. Stomach/Bowel: Stomach is within normal limits. Appendix appears normal. No evidence of small bowel wall thickening, distention, or inflammatory changes. Extensive sigmoid colon  diverticulitis. Circumferential thickening of the rectum. Vascular/Lymphatic: No significant vascular findings are present. No enlarged abdominal or pelvic lymph nodes. Reproductive: Status post hysterectomy. No adnexal masses. Other: No abdominal wall hernia or abnormality. No abdominopelvic ascites. Musculoskeletal: Stable sclerotic changes in the spine. Stable compression deformity of T12 vertebral body. IMPRESSION: 1. Extensive  sigmoid colon diverticulitis. 2. Circumferential thickening of the rectum. Please correlate with clinical symptoms of proctitis versus internal hemorrhoids. 3. Stable sclerotic changes in the spine. 4. Stable compression deformity of T12 vertebral body. 5. Chronic interstitial lung changes. 6. Previously noted minute pulmonary nodules in the right middle lobe are stable. Electronically Signed   By: Ted Mcalpine M.D.   On: 07/29/2022 17:05    Procedures Procedures    Medications Ordered in ED Medications  sodium chloride 0.9 % bolus 1,000 mL (1,000 mLs Intravenous New Bag/Given 07/29/22 1558)  sodium chloride 0.9 % bolus 500 mL (500 mLs Intravenous New Bag/Given 07/29/22 1558)  iohexol (OMNIPAQUE) 300 MG/ML solution 100 mL (100 mLs Intravenous Contrast Given 07/29/22 1638)    ED Course/ Medical Decision Making/ A&P                             Medical Decision Making Amount and/or Complexity of Data Reviewed Labs: ordered. Radiology: ordered.  Risk Prescription drug management. Decision regarding hospitalization.    Medical Screen Complete  This patient presented to the ED with complaint of abdominal pain, constipation.  This complaint involves an extensive number of treatment options. The initial differential diagnosis includes, but is not limited to, constipation, diverticulitis, bowel perforation, bowel obstruction, metabolic abnormality, etc.  This presentation is: Acute, Chronic, Self-Limited, Previously Undiagnosed, Uncertain Prognosis,  Complicated, Systemic Symptoms, and Threat to Life/Bodily Function  Patient with known history of constipation and chronic narcotic use presents with complaint of abdominal pain and associated constipation.  CT imaging demonstrates evidence of diverticulitis.  Patient would benefit from admission for treatment of same.  Hospitalist service aware of case and will evaluate for admission.  Additional history obtained: External records from outside sources obtained and reviewed including prior ED visits and prior Inpatient records.    Lab Tests:  I ordered and personally interpreted labs.   Imaging Studies ordered:  I ordered imaging studies including CT abdomen pelvis I independently visualized and interpreted obtained imaging which showed acute diverticulitis I agree with the radiologist interpretation.   Cardiac Monitoring:  The patient was maintained on a cardiac monitor.  I personally viewed and interpreted the cardiac monitor which showed an underlying rhythm of: NSR   Medicines ordered:  I ordered medication including antibiotics for diverticulitis Reevaluation of the patient after these medicines showed that the patient: improved   Problem List / ED Course:  Diverticulitis   Reevaluation:  After the interventions noted above, I reevaluated the patient and found that they have: stayed the same   Disposition:  After consideration of the diagnostic results and the patients response to treatment, I feel that the patent would benefit from admission.          Final Clinical Impression(s) / ED Diagnoses Final diagnoses:  Diverticulitis    Rx / DC Orders ED Discharge Orders     None         Wynetta Fines, MD 07/29/22 1740

## 2022-07-29 NOTE — Telephone Encounter (Signed)
Returned call to patient. Pt's daughter Duwayne Heck answered the phone, pt was present for the phone call. They are currently waiting on an ambulance to pick patient up for ED evaluation. They believe that patient is impacted. 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 Pt's appt has been rescheduled to 08/07/22 at 10:20 am with Dr. Marina Goodell for further evaluation. Patient and her daughter had no additional concerns at the end of the call.

## 2022-07-29 NOTE — ED Notes (Signed)
ED TO INPATIENT HANDOFF REPORT  ED Nurse Name and Phone #: Thamas Jaegers Name/Age/Gender [name] 72 y.o. female Room/Bed: WA06/WA06  Code Status   Code Status: Prior  Home/SNF/Other Home Patient oriented to: self, place, time, and situation Is this baseline? Yes   Triage Complete: Triage complete  Chief Complaint Acute diverticulitis [K57.92]  Triage Note BIB EMS from home for constipation. Has been 2 weeks since last BM. Pt is on oxycodone at home for pain management for lung cancer.    Allergies Allergies  Allergen Reactions   Bee Venom Anaphylaxis   Morphine And Codeine Other (See Comments)    Overly sedated   Neurontin [Gabapentin] Other (See Comments)    Nightmares    Codeine Hives   Adhesive [Tape] Itching and Other (See Comments)    Redness, Paper tape is ok   Sulfa Antibiotics Other (See Comments)    Childhood allergy Unknown reaction   Sulfonamide Derivatives Other (See Comments)    Childhood allergy Unknown reaction    Level of Care/Admitting Diagnosis ED Disposition     ED Disposition  Admit   Condition  --   Comment  Hospital Area: [facility] [[id number]]  Level of Care: Med-Surg [16]  May admit patient to [facility] or [facility] if equivalent level of care is available:: No  Covid Evaluation: Asymptomatic - no recent exposure (last 10 days) testing not required  Diagnosis: Acute diverticulitis [[id number]]  Admitting Physician: Bobette Mo [[id number]]  Attending Physician: Bobette Mo [[id number]]  Certification:: I certify this patient will need inpatient services for at least 2 midnights  Estimated Length of Stay: 2          B Medical/Surgery History Past Medical History:  Diagnosis Date   Anxiety    Aortic atherosclerosis (HCC)    Bilateral cataracts    Carpal tunnel syndrome    Cervical cancer (HCC)    Chronic back pain    Claudication (HCC)    COPD (chronic obstructive pulmonary disease)  (HCC)    wheezing   DDD (degenerative disc disease), cervical    DDD (degenerative disc disease), lumbar    Depression    Diverticulosis    Dyspnea    on exertion   Gastroparesis    GERD (gastroesophageal reflux disease)    Hearing loss    Bilateral   History of blood transfusion    History of colon polyps    History of migraine    History of radiation therapy    Left scapula,left chest, right pelvis- 12/20/21-01/02/22- Dr. 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   rt   Urge incontinence of urine    Varicose vein of leg    Venous insufficiency    Past Surgical History:  Procedure Laterality Date  BACK SURGERY     BIOPSY  08/31/2021   Procedure: BIOPSY;  Surgeon: Tressia Danas, MD;  Location: [facility] ENDOSCOPY;  Service: Gastroenterology;;   BLEPHAROPLASTY  10/10/2017   BRONCHIAL BIOPSY  11/21/2021   Procedure: BRONCHIAL BIOPSIES;  Surgeon: Oretha Milch, MD;  Location: [facility] ENDOSCOPY;  Service: Cardiopulmonary;;   BRONCHIAL BRUSHINGS  11/21/2021   Procedure: BRONCHIAL BRUSHINGS;  Surgeon: Oretha Milch, MD;  Location: [facility] ENDOSCOPY;  Service: Cardiopulmonary;;   BRONCHIAL NEEDLE ASPIRATION BIOPSY  11/21/2021   Procedure: BRONCHIAL NEEDLE ASPIRATION BIOPSIES;  Surgeon: Oretha Milch, MD;  Location: [facility] ENDOSCOPY;  Service: Cardiopulmonary;;   BRONCHIAL WASHINGS  11/21/2021   Procedure: BRONCHIAL WASHINGS;  Surgeon: Oretha Milch, MD;  Location: [facility] ENDOSCOPY;  Service: Cardiopulmonary;;    CATARACT EXTRACTION, BILATERAL     CESAREAN SECTION     CHOLECYSTECTOMY     COLONOSCOPY  10/2016   COLONOSCOPY WITH PROPOFOL N/A 08/31/2021   Procedure: COLONOSCOPY WITH PROPOFOL;  Surgeon: Tressia Danas, MD;  Location: [facility] ENDOSCOPY;  Service: Gastroenterology;  Laterality: N/A;   ESOPHAGOGASTRODUODENOSCOPY (EGD) WITH PROPOFOL N/A 08/31/2021   Procedure: ESOPHAGOGASTRODUODENOSCOPY (EGD) WITH PROPOFOL;  Surgeon: Tressia Danas, MD;  Location: [facility] ENDOSCOPY;  Service: Gastroenterology;  Laterality: N/A;   HAND SURGERY Bilateral    carpal tunnel   HEMORRHOID SURGERY     HEMOSTASIS CONTROL  11/21/2021   Procedure: HEMOSTASIS CONTROL;  Surgeon: Oretha Milch, MD;  Location: [facility] ENDOSCOPY;  Service: Cardiopulmonary;;   INTERSTIM IMPLANT PLACEMENT     IR IMAGING GUIDED PORT INSERTION  01/17/2022   KNEE SURGERY Left    arthroscopy x3   LOWER EXTREMITY ANGIOGRAPHY N/A 07/16/2016   Procedure: Lower Extremity Angiography;  Surgeon: Yates Decamp, MD;  Location: [facility] INVASIVE CV LAB;  Service: Cardiovascular;  Laterality: N/A;   LOWER EXTREMITY INTERVENTION N/A 08/06/2016   Procedure: Lower Extremity Intervention;  Surgeon: Yates Decamp, MD;  Location: [facility] INVASIVE CV LAB;  Service: Cardiovascular;  Laterality: N/A;   NECK SURGERY     OTHER SURGICAL HISTORY  2013   bladder stimulator in back   PERIPHERAL VASCULAR BALLOON ANGIOPLASTY  08/06/2016   Procedure: Peripheral Vascular Balloon Angioplasty;  Surgeon: Yates Decamp, MD;  Location: [facility] INVASIVE CV LAB;  Service: Cardiovascular;;  Right SFA   POLYPECTOMY  08/31/2021   Procedure: POLYPECTOMY;  Surgeon: Tressia Danas, MD;  Location: [facility] ENDOSCOPY;  Service: Gastroenterology;;   TENNIS ELBOW RELEASE/NIRSCHEL PROCEDURE Right 12/2015   TOE SURGERY Right    right foot second and fourth toe   TOTAL HIP ARTHROPLASTY Left    TOTAL HIP REVISION Left 10/13/2017   Procedure: LEFT TOTAL HIP REVISION ACETABULUM, OPEN REDUCTION INTERNAL WITH BONE GRAFT FIXATION LEFT  GREATER TROCHANTERIC FRACTURE;  Surgeon: Gean Birchwood, MD;  Location: [facility] ORS;  Service: Orthopedics;  Laterality: Left;   TOTAL KNEE ARTHROPLASTY Right 01/12/2016   Procedure: RIGHT TOTAL KNEE ARTHROPLASTY;  Surgeon: Beverely Low, MD;  Location: [facility] OR;  Service: Orthopedics;  Laterality: Right;   TUBAL LIGATION     UPPER GI ENDOSCOPY  10/2016   VAGINAL HYSTERECTOMY     VIDEO BRONCHOSCOPY WITH ENDOBRONCHIAL ULTRASOUND N/A 11/21/2021   Procedure: VIDEO BRONCHOSCOPY WITH ENDOBRONCHIAL ULTRASOUND;  Surgeon: Oretha Milch, MD;  Location: [facility] ENDOSCOPY;  Service: Cardiopulmonary;  Laterality: N/A;     A IV Location/Drains/Wounds Patient Lines/Drains/Airways Status     Active Line/Drains/Airways     Name Placement date Placement time Site Days   Implanted Port 01/17/22 Right Chest 01/17/22  1345  Chest  193   Peripheral IV 07/29/22 22 G Left Antecubital 07/29/22  1553  Antecubital  less than 1            Intake/Output Last 24 hours No intake or output data in the 24 hours ending 07/29/22 1754  Labs/Imaging Results for orders placed or performed during the hospital encounter of 07/29/22 (from the past 48 hour(s))  Comprehensive metabolic panel     Status: Abnormal   Collection Time: 07/29/22  3:56 PM  Result Value Ref Range   Sodium 132 (L) 135 - 145 mmol/L   Potassium 3.4 (L) 3.5 - 5.1 mmol/L   Chloride 98 98 - 111 mmol/L   CO2 27 22 - 32 mmol/L   Glucose, Bld 114 (H) 70 - 99 mg/dL    Comment: Glucose reference range applies only to samples taken after fasting for at least 8 hours.   BUN 10 8 - 23 mg/dL   Creatinine, Ser 6.57 0.44 - 1.00 mg/dL   Calcium 8.8 (L) 8.9 - 10.3 mg/dL   Total Protein 6.9 6.5 - 8.1 g/dL   Albumin 3.4 (L) 3.5 - 5.0 g/dL   AST 18 15 - 41 U/L   ALT 15 0 - 44 U/L   Alkaline Phosphatase 69 38 - 126 U/L   Total Bilirubin 0.5 0.3 - 1.2 mg/dL   GFR, Estimated >84 >69 mL/min    Comment: (NOTE) Calculated using the CKD-EPI Creatinine Equation (2021)     Anion gap 7 5 - 15    Comment: Performed at [facility], [address], [place], [place] [address]  Lipase, blood     Status: None   Collection Time: 07/29/22  3:56 PM  Result Value Ref Range   Lipase 25 11 - 51 U/L    Comment: Performed at [facility], [address], [place], [place] [address]  CBC with Differential     Status: Abnormal   Collection Time: 07/29/22  3:56 PM  Result Value Ref Range   WBC 4.5 4.0 - 10.5 K/uL   RBC 2.91 (L) 3.87 - 5.11 MIL/uL   Hemoglobin 8.6 (L) 12.0 - 15.0 g/dL   HCT 44.0 (L) 10.2 - 72.5 %   MCV 94.8 80.0 - 100.0 fL   MCH 29.6 26.0 - 34.0 pg   MCHC 31.2 30.0 - 36.0 g/dL   RDW 36.6 (H) 44.0 - 34.7 %   Platelets 171 150 - 400 K/uL   nRBC 0.0 0.0 - 0.2 %   Neutrophils Relative % 79 %   Neutro Abs 3.5 1.7 - 7.7 K/uL   Lymphocytes Relative 13 %   Lymphs Abs 0.6 (L) 0.7 - 4.0 K/uL   Monocytes Relative 6 %   Monocytes Absolute 0.3 0.1 - 1.0 K/uL   Eosinophils Relative 2 %   Eosinophils Absolute 0.1 0.0 - 0.5 K/uL   Basophils Relative 0 %   Basophils Absolute 0.0 0.0 - 0.1 K/uL   Immature Granulocytes 0 %   Abs Immature Granulocytes 0.02 0.00 - 0.07 K/uL    Comment: Performed at [facility], [address] [place], [place] [address]   CT ABDOMEN PELVIS W CONTRAST  Result Date: 07/29/2022 CLINICAL DATA:  Left lower quadrant abdominal pain. EXAM: CT ABDOMEN AND PELVIS WITH CONTRAST TECHNIQUE: Multidetector CT imaging of the abdomen and pelvis was performed using the standard protocol following bolus administration of intravenous contrast. RADIATION DOSE REDUCTION: This exam was performed according to the departmental dose-optimization  program which includes automated exposure control, adjustment of the mA and/or kV according to patient size and/or use of iterative reconstruction technique. CONTRAST:  OMNIPAQUE IOHEXOL 300 MG/ML  SOLN COMPARISON:  July 12, 2022 FINDINGS: Lower chest: Chronic  interstitial lung changes. Previously noted minute pulmonary nodules in the right middle lobe are stable. Hepatobiliary: No focal liver abnormality is seen. Status post cholecystectomy. No biliary dilatation. Pancreas: Unremarkable. No pancreatic ductal dilatation or surrounding inflammatory changes. Spleen: Normal in size without focal abnormality. Adrenals/Urinary Tract: Adrenal glands are unremarkable. Kidneys are normal, without renal calculi, focal lesion, or hydronephrosis. Bladder is unremarkable. Stomach/Bowel: Stomach is within normal limits. Appendix appears normal. No evidence of small bowel wall thickening, distention, or inflammatory changes. Extensive sigmoid colon diverticulitis. Circumferential thickening of the rectum. Vascular/Lymphatic: No significant vascular findings are present. No enlarged abdominal or pelvic lymph nodes. Reproductive: Status post hysterectomy. No adnexal masses. Other: No abdominal wall hernia or abnormality. No abdominopelvic ascites. Musculoskeletal: Stable sclerotic changes in the spine. Stable compression deformity of T12 vertebral body. IMPRESSION: 1. Extensive sigmoid colon diverticulitis. 2. Circumferential thickening of the rectum. Please correlate with clinical symptoms of proctitis versus internal hemorrhoids. 3. Stable sclerotic changes in the spine. 4. Stable compression deformity of T12 vertebral body. 5. Chronic interstitial lung changes. 6. Previously noted minute pulmonary nodules in the right middle lobe are stable. Electronically Signed   By: Ted Mcalpine M.D.   On: 07/29/2022 17:05    Pending Labs Unresulted Labs (From admission, onward)     Start     Ordered   07/29/22 1515  Urinalysis, w/ Reflex to Culture (Infection Suspected) -Urine, Clean Catch  Once,   URGENT       Question:  Specimen Source  Answer:  Urine, Clean Catch   07/29/22 1514            Vitals/Pain Today's Vitals   07/29/22 1515 07/29/22 1530 07/29/22 1600 07/29/22  1630  BP: 137/66 130/65 (!) 149/79 (!) 151/73  Pulse: 97 98 95 97  Resp: 16 16 (!) 23 19  Temp:      TempSrc:      SpO2: 94% 98% 100% 99%  Weight:      Height:      PainSc:        Isolation Precautions No active isolations  Medications Medications  cefTRIAXone (ROCEPHIN) 2 g in sodium chloride 0.9 % 100 mL IVPB (2 g Intravenous New Bag/Given 07/29/22 1723)    And  metroNIDAZOLE (FLAGYL) IVPB 500 mg (has no administration in time range)  sodium chloride 0.9 % bolus 1,000 mL (1,000 mLs Intravenous New Bag/Given 07/29/22 1558)  sodium chloride 0.9 % bolus 500 mL (500 mLs Intravenous New Bag/Given 07/29/22 1558)  iohexol (OMNIPAQUE) 300 MG/ML solution 100 mL (100 mLs Intravenous Contrast Given 07/29/22 1638)    Mobility walks with device     Focused Assessments     R Recommendations: See Admitting Provider Note  Report given to:   Additional Notes:

## 2022-07-29 NOTE — Telephone Encounter (Signed)
Inbound call from patient states she spoke with an on call provider yesterday and they advised her to take laxatives due to her having trouble making a bowl movement. States she is still experiencing symptoms as well as rectal pain and she is now passing blood. Patients state she will head to [facility] hospital. Requesting f/u cal from a nurse . Please advise.  Thank you

## 2022-07-29 NOTE — H&P (Signed)
History and Physical    Patient: [name]:[id number] DOB: October 09, 1950 DOA: 07/29/2022 DOS: the patient was seen and examined on 07/29/2022 PCP: [name], NP  Patient coming from: Home  Chief Complaint:  Chief Complaint  Patient presents with   Constipation   HPI: [name] is a 72 y.o. female with medical history significant of anxiety, depression, aortic atherosclerosis, bilateral cataracts carpal tunnel syndrome cervical cancer chronic back pain, claudication, COPD cervical DDD, lumbar DDD, blood transfusion, migraine headaches, hypertension, iron deficiency anemia, pulm nodule osteoarthritis, history of pneumonia, prediabetes, Raynaud's phenomenon, sleep apnea not on CPAP, spondylolysis, history of nonhemorrhagic CVA, tennis elbow syndrome, urinary urgency, varicose veins, colon polyps, GERD, gastroparesis, constipation, diverticulosis who is coming to the emergency department with complaints of abdominal pain and constipation for the past 2 weeks, in which she has not been able to have a "good bowel movement". He denied fever, chills, rhinorrhea, sore throat, wheezing or hemoptysis.  No chest pain, palpitations, diaphoresis, PND, orthopnea or pitting edema of the lower extremities.  No abdominal pain, nausea, emesis, diarrhea, constipation, melena or hematochezia.  No flank pain, dysuria, frequency or hematuria.  No polyuria, polydipsia, polyphagia or blurred vision.   Lab work: Her urinalysis showed moderate hemoglobin but was otherwise unremarkable.  CBC showed a white count of 4.5, hemoglobin 8.6 g/dL platelets 440.  Lipase was normal.  CMP with a sodium 132 and potassium 3.4 mmol/L.  Glucose 140 mg deciliter and albumin 3.4 g/dL.  The rest of the CMP measurements were normal after calcium correction.  Imaging: CT abdomen/pelvis with contrast showing extensive sigmoid colon diverticulitis.  Circumferential thickening of the rectum.  Please correlate with clinical  symptoms of proctitis versus internal hemorrhoids.  Stable sclerotic changes in the spine.  Stable compression deformity of T12.  Chronic interstitial lung changes.  Stable pulmonary nodules.   ED course: Initial vital signs were temperature 97.8 F, pulse 100, respiration 18, BP 119/74 mmHg and O2 sat 95% on room air.  Patient received ceftriaxone 2 g IVPB, metronidazole 500 mg IVPB and normal saline 1500 mL bolus.  Review of Systems: As mentioned in the history of present illness. All other systems reviewed and are negative. Past Medical History:  Diagnosis Date   Anxiety    Aortic atherosclerosis (HCC)    Bilateral cataracts    Carpal tunnel syndrome    Cervical cancer (HCC)    Chronic back pain    Claudication (HCC)    COPD (chronic obstructive pulmonary disease) (HCC)    wheezing   DDD (degenerative disc disease), cervical    DDD (degenerative disc disease), lumbar    Depression    Diverticulosis    Dyspnea    on exertion   Gastroparesis    GERD (gastroesophageal reflux disease)    Hearing loss    Bilateral   History of blood transfusion    History of colon polyps    History of migraine    History of radiation therapy    Left scapula,left chest, right pelvis- 12/20/21-01/02/22- Dr. 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   rt   Urge incontinence of urine    Varicose vein of leg    Venous insufficiency    Past Surgical History:  Procedure Laterality Date   BACK SURGERY     BIOPSY   08/31/2021   Procedure: BIOPSY;  Surgeon: Tressia Danas, MD;  Location: [facility] ENDOSCOPY;  Service: Gastroenterology;;   BLEPHAROPLASTY  10/10/2017   BRONCHIAL BIOPSY  11/21/2021   Procedure: BRONCHIAL BIOPSIES;  Surgeon: Oretha Milch, MD;  Location: [facility] ENDOSCOPY;  Service: Cardiopulmonary;;   BRONCHIAL BRUSHINGS  11/21/2021   Procedure: BRONCHIAL BRUSHINGS;  Surgeon: Oretha Milch, MD;  Location: [facility] ENDOSCOPY;  Service: Cardiopulmonary;;   BRONCHIAL NEEDLE ASPIRATION BIOPSY  11/21/2021   Procedure: BRONCHIAL NEEDLE ASPIRATION BIOPSIES;  Surgeon: Oretha Milch, MD;  Location: [facility] ENDOSCOPY;  Service: Cardiopulmonary;;   BRONCHIAL WASHINGS  11/21/2021   Procedure: BRONCHIAL WASHINGS;  Surgeon: Oretha Milch, MD;  Location: [facility] ENDOSCOPY;  Service: Cardiopulmonary;;   CATARACT EXTRACTION, BILATERAL     CESAREAN SECTION     CHOLECYSTECTOMY     COLONOSCOPY  10/2016   COLONOSCOPY WITH PROPOFOL N/A 08/31/2021   Procedure: COLONOSCOPY WITH PROPOFOL;  Surgeon: Tressia Danas, MD;  Location: [facility] ENDOSCOPY;  Service: Gastroenterology;  Laterality: N/A;   ESOPHAGOGASTRODUODENOSCOPY (EGD) WITH PROPOFOL N/A 08/31/2021   Procedure: ESOPHAGOGASTRODUODENOSCOPY (EGD) WITH PROPOFOL;  Surgeon: Tressia Danas, MD;  Location: [facility] ENDOSCOPY;  Service: Gastroenterology;  Laterality: N/A;   HAND SURGERY Bilateral    carpal tunnel   HEMORRHOID SURGERY     HEMOSTASIS CONTROL  11/21/2021   Procedure: HEMOSTASIS CONTROL;  Surgeon: Oretha Milch, MD;  Location: [facility] ENDOSCOPY;  Service: Cardiopulmonary;;   INTERSTIM IMPLANT PLACEMENT     IR IMAGING GUIDED PORT INSERTION  01/17/2022   KNEE SURGERY Left    arthroscopy x3   LOWER EXTREMITY ANGIOGRAPHY N/A 07/16/2016   Procedure: Lower Extremity Angiography;  Surgeon: Yates Decamp, MD;  Location: [facility] INVASIVE CV LAB;  Service: Cardiovascular;  Laterality: N/A;   LOWER EXTREMITY INTERVENTION N/A 08/06/2016   Procedure: Lower Extremity Intervention;  Surgeon: Yates Decamp, MD;   Location: [facility] INVASIVE CV LAB;  Service: Cardiovascular;  Laterality: N/A;   NECK SURGERY     OTHER SURGICAL HISTORY  2013   bladder stimulator in back   PERIPHERAL VASCULAR BALLOON ANGIOPLASTY  08/06/2016   Procedure: Peripheral Vascular Balloon Angioplasty;  Surgeon: Yates Decamp, MD;  Location: [facility] INVASIVE CV LAB;  Service: Cardiovascular;;  Right SFA   POLYPECTOMY  08/31/2021   Procedure: POLYPECTOMY;  Surgeon: Tressia Danas, MD;  Location: [facility] ENDOSCOPY;  Service: Gastroenterology;;   TENNIS ELBOW RELEASE/NIRSCHEL PROCEDURE Right 12/2015   TOE SURGERY Right    right foot second and fourth toe   TOTAL HIP ARTHROPLASTY Left    TOTAL HIP REVISION Left 10/13/2017   Procedure: LEFT TOTAL HIP REVISION ACETABULUM, OPEN REDUCTION INTERNAL WITH BONE GRAFT FIXATION LEFT GREATER TROCHANTERIC FRACTURE;  Surgeon: Gean Birchwood, MD;  Location: [facility] ORS;  Service: Orthopedics;  Laterality: Left;   TOTAL KNEE ARTHROPLASTY Right 01/12/2016   Procedure: RIGHT TOTAL KNEE ARTHROPLASTY;  Surgeon: Beverely Low, MD;  Location: [facility] OR;  Service: Orthopedics;  Laterality: Right;   TUBAL LIGATION     UPPER GI ENDOSCOPY  10/2016   VAGINAL HYSTERECTOMY     VIDEO BRONCHOSCOPY WITH ENDOBRONCHIAL ULTRASOUND N/A 11/21/2021   Procedure: VIDEO BRONCHOSCOPY WITH ENDOBRONCHIAL ULTRASOUND;  Surgeon: Oretha Milch, MD;  Location: [facility] ENDOSCOPY;  Service: Cardiopulmonary;  Laterality: N/A;   Social History:  reports that she has been smoking cigarettes. She has a 27.5 pack-year smoking history. She has been exposed to tobacco smoke. She has never used smokeless tobacco. She reports that she does not drink alcohol and does not use drugs.  Allergies  Allergen Reactions   Bee Venom Anaphylaxis   Morphine And Codeine Other (See Comments)    Overly sedated   Neurontin [Gabapentin] Other (See Comments)    Nightmares    Codeine Hives   Adhesive [Tape] Itching and Other (See Comments)    Redness, Paper tape is ok   Sulfa  Antibiotics Other (See Comments)    Childhood allergy Unknown reaction   Sulfonamide Derivatives Other (See Comments)    Childhood allergy Unknown reaction    Family History  Problem Relation Age of Onset   Hyperlipidemia Mother    Hypertension Mother    Anxiety disorder Mother    Depression Mother    Hyperlipidemia Father    Hypertension Father    Alzheimer's disease Father    Anxiety disorder Brother    Anxiety disorder Brother    Anxiety disorder Brother    Drug abuse Brother    Stroke Brother    Cancer Daughter        unknown   Colon cancer Neg Hx    Esophageal cancer Neg Hx    Rectal cancer Neg Hx    Stomach cancer Neg Hx     Prior to Admission medications   Medication Sig Start Date End Date Taking? Authorizing Provider  acetaminophen (TYLENOL) 325 MG tablet Take 650 mg by mouth 2 (two) times daily as needed for headache.    [provider]  albuterol (PROVENTIL) (2.5 MG/3ML) 0.083% nebulizer solution Inhale 3 mLs into the lungs every 4 (four) hours as needed for wheezing or shortness of breath (cough). Patient taking differently: Inhale 3 mLs into the lungs 2 (two) times daily as needed for wheezing or shortness of breath (cough). 09/01/21   Sabino Dick, DO  albuterol (VENTOLIN HFA) 108 (90 Base) MCG/ACT inhaler Inhale 2 puffs into the lungs 2 (two) times daily as needed for wheezing or shortness of breath.    [provider]  aspirin EC (ASPIRIN 81) 81 MG tablet Take 1 tablet (81 mg total) by mouth daily. 03/28/22     busPIRone (BUSPAR) 5 MG tablet Take 1 tablet (5 mg total) by mouth 2 (two) times daily. 02/18/18   Jomarie Longs, MD  cephALEXin (KEFLEX) 500 MG capsule Take 1 capsule (500 mg total) by mouth 4 (four) times daily. 07/23/22   Heilingoetter, Cassandra L, PA-C  Cholecalciferol (VITAMIN D-3 PO) Take 2,000 Units by mouth daily.    [provider]  cyanocobalamin (VITAMIN B12) 1000 MCG/ML injection Inject 1,000 mcg into the muscle  every 30 (thirty) days. 09/11/21   [provider]  docusate sodium (COLACE) 100 MG capsule Take 200 mg by mouth daily as needed (constipation).    [provider]  DULoxetine (CYMBALTA) 60 MG capsule Take 2 capsules (120 mg total) by mouth daily. 03/28/22     DULoxetine (CYMBALTA) 60 MG capsule Take 2 capsules (120 mg total) by mouth daily. 07/02/22     fluconazole (DIFLUCAN) 100 MG tablet Take 1 tablet (100 mg  total) by mouth daily. 07/23/22   Heilingoetter, Cassandra L, PA-C  hydrocortisone (ANUSOL-HC) 25 MG suppository Place 1 suppository (25 mg total) rectally 2 (two) times daily as needed for hemorrhoids or anal itching. Patient not taking: Reported on 05/28/2022 09/01/21   Sabino Dick, DO  lidocaine-prilocaine (EMLA) cream Apply 30 minutes before appointment 02/27/22   Heilingoetter, Cassandra L, PA-C  linaclotide (LINZESS) 290 MCG CAPS capsule Take 1 capsule (290 mcg total) by mouth daily before breakfast. 07/11/22   Unk Lightning, PA  LORazepam (ATIVAN) 0.5 MG tablet Take 1 tablet 30 minutes before your MRI 07/23/22   Heilingoetter, Cassandra L, PA-C  losartan (COZAAR) 25 MG tablet Take 25 mg by mouth daily. 10/08/21   [provider]  methocarbamol (ROBAXIN) 500 MG tablet Take 1,000 mg by mouth 2 (two) times daily. 05/28/22   [provider]  methocarbamol (ROBAXIN) 500 MG tablet Take 1 tablet (500 mg total) by mouth every 6 hours as needed. 07/02/22     metoCLOPramide (REGLAN) 5 MG tablet Take 1 tablet by mouth 3 times daily 20-30 minutes before meals NO FURTHER REFILLS UNTIL SEEN, NEEDS AND APPOINTMENT Patient taking differently: Take 5 mg by mouth 3 (three) times daily before meals. 02/19/21   Hilarie Fredrickson, MD  metoprolol succinate (TOPROL-XL) 25 MG 24 hr tablet TAKE 1 TABLET BY MOUTH DAILY. TAKE WITH OR IMMEDIATELY FOLLOWING A MEAL. 07/22/22   Yates Decamp, MD  mupirocin ointment (BACTROBAN) 2 % Apply 1 Application topically 2 (two) times daily. 07/23/22    Heilingoetter, Cassandra L, PA-C  nitroGLYCERIN (NITROSTAT) 0.4 MG SL tablet Place 1 tablet (0.4 mg total) under the tongue every 5 (five) minutes x 3 doses as needed for chest pain. Patient not taking: Reported on 05/28/2022 05/27/22   Yates Decamp, MD  ondansetron [facility]) 8 MG tablet Take 1 tablet (8 mg total) by mouth every 8 (eight) hours as needed for nausea or vomiting. 06/26/22   Heilingoetter, Cassandra L, PA-C  Oxycodone HCl 10 MG TABS Take 10 mg by mouth 5 (five) times daily as needed (for moderate pain). 09/13/21   [provider]  Oxycodone HCl 10 MG TABS Take 1 tablet (10 mg total) by mouth every 4 hours as needed for pain 07/02/22     pantoprazole (PROTONIX) 40 MG tablet Take 1 tablet (40 mg total) by mouth daily as needed (acid reflux). 04/30/22   Heilingoetter, Cassandra L, PA-C  potassium chloride SA (KLOR-CON M) 20 MEQ tablet Take 1 tablet (20 mEq total) by mouth 2 (two) times daily. 03/13/22   Heilingoetter, Cassandra L, PA-C  potassium chloride SA (KLOR-CON M) 20 MEQ tablet Take 1 tablet (20 mEq total) by mouth 2 (two) times daily. 05/28/22   Si Gaul, MD  prochlorperazine (COMPAZINE) 10 MG tablet Take 1 tablet (10 mg total) by mouth every 6 (six) hours as needed for nausea or vomiting. 06/26/22   Heilingoetter, Cassandra L, PA-C  rOPINIRole (REQUIP) 1 MG tablet Take 1 tablet (1 mg total) by mouth 3 (three) times daily as directed 03/28/22     rosuvastatin (CRESTOR) 10 MG tablet Take 10 mg by mouth at bedtime. 06/10/20   [provider]  senna (SENOKOT) 8.6 MG tablet Take 17.2 mg by mouth daily as needed for constipation.    [provider]  Tiotropium Bromide-Olodaterol (STIOLTO RESPIMAT) 2.5-2.5 MCG/ACT AERS Inhale 2 puffs into the lungs daily. 11/09/21   Parrett, Virgel Bouquet, NP  traZODone (DESYREL) 50 MG tablet TAKE 0.5-1 TABLETS (25-50 MG  TOTAL) BY MOUTH AT BEDTIME AS NEEDED FOR SLEEP. Patient taking differently: Take 50 mg by mouth at bedtime. 03/16/18    Jomarie Longs, MD    Physical Exam: Vitals:   07/29/22 1515 07/29/22 1530 07/29/22 1600 07/29/22 1630  BP: 137/66 130/65 (!) 149/79 (!) 151/73  Pulse: 97 98 95 97  Resp: 16 16 (!) 23 19  Temp:      TempSrc:      SpO2: 94% 98% 100% 99%  Weight:      Height:       Physical Exam Vitals and nursing note reviewed.  Constitutional:      General: She is awake. She is not in acute distress.    Appearance: She is obese.  HENT:     Head: Normocephalic.     Nose: No rhinorrhea.     Mouth/Throat:     Mouth: Mucous membranes are dry.  Eyes:     General: No scleral icterus.    Pupils: Pupils are equal, round, and reactive to light.  Neck:     Vascular: No JVD.  Cardiovascular:     Rate and Rhythm: Normal rate and regular rhythm.     Heart sounds: S1 normal and S2 normal.  Pulmonary:     Effort: Pulmonary effort is normal.     Breath sounds: Normal breath sounds.  Abdominal:     General: Bowel sounds are normal.     Palpations: Abdomen is soft.     Tenderness: There is abdominal tenderness.  Musculoskeletal:     Cervical back: Neck supple.     Right lower leg: No edema.     Left lower leg: No edema.  Skin:    General: Skin is warm and dry.  Neurological:     General: No focal deficit present.     Mental Status: She is alert and oriented to person, place, and time.  Psychiatric:        Mood and Affect: Mood normal.        Behavior: Behavior normal. Behavior is cooperative.   Data Reviewed:  Results are pending, will review when available.  EKG: Vent. rate 96 BPM PR interval 189 ms QRS duration 88 ms QT/QTcB 362/458 ms P-R-T axes 63 41 28 Sinus rhythm Probable left atrial enlargement Low voltage, precordial leads  Assessment and Plan: Principal Problem:   Diverticulosis of colon Complicated by:   Acute diverticulitis Admit to MedSurg/inpatient. Continue IV fluids. Continue ceftriaxone 1 g IVPB daily. Continue metronidazole 500 mg IVPB q 12 hr. Analgesics  as needed. Antiemetics as needed. Follow CBC and CMP in a.m.  Active Problems:   Constipation Proctitis versus hemorrhoids? Correct electrolytes. Continue IV hydration. Continue stool softeners.    Essential hypertension Continue metoprolol succinate 25 mg p.o. daily.    GERD Resume PPI.    Gastroparesis No longer taking metoclopramide. Will order metoclopramide 5 mg IVP every 6 hours.    Hypokalemia  Correcting. Magnesium was supplemented. Will be following potassium level in the morning.    Tobacco abuse Nicotine replacement therapy ordered PRN.    OSA (obstructive sleep apnea) Not on CPAP.    COPD (chronic obstructive pulmonary disease) (HCC) Continue albuterol nebs as needed.    Normocytic anemia Due to chronic blood loss/history of antineoplastic surgery. Monitor hematocrit and hemoglobin. Transfuse as needed        Advance Care Planning:   Code Status: Full Code   Consults:   Family Communication:   Severity of Illness: The appropriate patient  status for this patient is INPATIENT. Inpatient status is judged to be reasonable and necessary in order to provide the required intensity of service to ensure the patient's safety. The patient's presenting symptoms, physical exam findings, and initial radiographic and laboratory data in the context of their chronic comorbidities is felt to place them at high risk for further clinical deterioration. Furthermore, it is not anticipated that the patient will be medically stable for discharge from the hospital within 2 midnights of admission.   * I certify that at the point of admission it is my clinical judgment that the patient will require inpatient hospital care spanning beyond 2 midnights from the point of admission due to high intensity of service, high risk for further deterioration and high frequency of surveillance required.*  Author: Bobette Mo, MD 07/29/2022 5:28 PM  For on call review  [email or web address].   This document was prepared using Dragon voice recognition software and may contain some unintended transcription errors.

## 2022-07-30 DIAGNOSIS — K5730 Diverticulosis of large intestine without perforation or abscess without bleeding: Secondary | ICD-10-CM

## 2022-07-30 DIAGNOSIS — E876 Hypokalemia: Secondary | ICD-10-CM

## 2022-07-30 DIAGNOSIS — D649 Anemia, unspecified: Secondary | ICD-10-CM | POA: Diagnosis not present

## 2022-07-30 DIAGNOSIS — K219 Gastro-esophageal reflux disease without esophagitis: Secondary | ICD-10-CM | POA: Diagnosis not present

## 2022-07-30 DIAGNOSIS — K5792 Diverticulitis of intestine, part unspecified, without perforation or abscess without bleeding: Secondary | ICD-10-CM | POA: Diagnosis not present

## 2022-07-30 DIAGNOSIS — I10 Essential (primary) hypertension: Secondary | ICD-10-CM

## 2022-07-30 LAB — CBC
HCT: 25.8 % — ABNORMAL LOW (ref 36.0–46.0)
Hemoglobin: 8.1 g/dL — ABNORMAL LOW (ref 12.0–15.0)
MCH: 30.3 pg (ref 26.0–34.0)
MCHC: 31.4 g/dL (ref 30.0–36.0)
MCV: 96.6 fL (ref 80.0–100.0)
Platelets: 145 10*3/uL — ABNORMAL LOW (ref 150–400)
RBC: 2.67 MIL/uL — ABNORMAL LOW (ref 3.87–5.11)
RDW: 15.9 % — ABNORMAL HIGH (ref 11.5–15.5)
WBC: 4.2 10*3/uL (ref 4.0–10.5)
nRBC: 0 % (ref 0.0–0.2)

## 2022-07-30 LAB — COMPREHENSIVE METABOLIC PANEL
ALT: 13 U/L (ref 0–44)
AST: 17 U/L (ref 15–41)
Albumin: 3.2 g/dL — ABNORMAL LOW (ref 3.5–5.0)
Alkaline Phosphatase: 63 U/L (ref 38–126)
Anion gap: 9 (ref 5–15)
BUN: 7 mg/dL — ABNORMAL LOW (ref 8–23)
CO2: 23 mmol/L (ref 22–32)
Calcium: 8.5 mg/dL — ABNORMAL LOW (ref 8.9–10.3)
Chloride: 106 mmol/L (ref 98–111)
Creatinine, Ser: 0.73 mg/dL (ref 0.44–1.00)
GFR, Estimated: >60 mL/min (ref >60)
Glucose, Bld: 96 mg/dL (ref 70–99)
Potassium: 3.8 mmol/L (ref 3.5–5.1)
Sodium: 138 mmol/L (ref 135–145)
Total Bilirubin: 0.2 mg/dL — ABNORMAL LOW (ref 0.3–1.2)
Total Protein: 6.3 g/dL — ABNORMAL LOW (ref 6.5–8.1)

## 2022-07-30 MED ORDER — LORAZEPAM 0.5 MG PO TABS
0.5000 mg | ORAL_TABLET | Freq: Once | ORAL | Status: AC
  Administered 2022-07-30: 0.5 mg via ORAL
  Filled 2022-07-30: qty 1

## 2022-07-30 MED ORDER — ENSURE ENLIVE PO LIQD
237.0000 mL | Freq: Two times a day (BID) | ORAL | Status: DC
  Administered 2022-07-30 – 2022-07-31 (×2): 237 mL via ORAL

## 2022-07-30 MED ORDER — NITROGLYCERIN 0.4 MG SL SUBL: 0.4000 mg | SUBLINGUAL_TABLET | SUBLINGUAL | Status: DC | PRN

## 2022-07-30 NOTE — Evaluation (Signed)
Physical Therapy Evaluation Patient Details Name: [name] MRN: [id number] DOB: 1951/01/05 Today's Date: 07/30/2022  History of Present Illness  72 y.o. female with medical history significant of anxiety, depression, aortic atherosclerosis, bilateral cataracts carpal tunnel syndrome cervical cancer chronic back pain, claudication, COPD cervical DDD, lumbar DDD, blood transfusion, migraine headaches, hypertension, iron deficiency anemia, pulm nodule osteoarthritis, history of pneumonia, prediabetes, Raynaud's phenomenon, sleep apnea not on CPAP, spondylolysis, history of nonhemorrhagic CVA, tennis elbow syndrome, urinary urgency, varicose veins, colon polyps, GERD, gastroparesis, constipation, diverticulosis who is coming to the emergency department 07/29/22 with complaints of abdominal pain and constipation . CT Abdomen shows extensive diverticulitis of the sigmoid colon, Circumferential thickening of the rectum.  Clinical Impression  Pt admitted with above diagnosis.  Pt currently with functional limitations due to the deficits listed below (see PT Problem List). Pt will benefit from acute skilled PT to increase their independence and safety with mobility to allow discharge.    The patient  reports  ambulatory  PTA with RW. Reports resides with spouse .  Patient ambulated  using Rw  to and from BR, cues for safety. Noted tremors of bilateral hands, constant.  Patient will benefit from HHPT to ensure safe return  to home with 24/7 assistnace. Patient reports incntinence at baseline.    Assistance Recommended at Discharge Intermittent Supervision/Assistance  If plan is discharge home, recommend the following:  Can travel by private vehicle  A little help with walking and/or transfers;A little help with bathing/dressing/bathroom;Assistance with cooking/housework;Direct supervision/assist for financial management;Assist for transportation;Help with stairs or ramp for entrance         Equipment Recommendations None recommended by PT  Recommendations for Other Services       Functional Status Assessment Patient has had a recent decline in their functional status and demonstrates the ability to make significant improvements in function in a reasonable and predictable amount of time.     Precautions / Restrictions Precautions Precautions: Fall Restrictions Weight Bearing Restrictions: No      Mobility  Bed Mobility Overal bed mobility: Needs Assistance Bed Mobility: Rolling, Sidelying to Sit Rolling: Supervision Sidelying to sit: Min assist       General bed mobility comments: cues to reach for rail, assist to sit upright    Transfers Overall transfer level: Needs assistance Equipment used: Rolling walker (2 wheels) Transfers: Sit to/from Stand Sit to Stand: Min assist           General transfer comment: cues for safety, back up to [facility]    Ambulation/Gait Ambulation/Gait assistance: Min assist Gait Distance (Feet): 20 Feet (x 2) Assistive device: Rolling walker (2 wheels) Gait Pattern/deviations: Step-to pattern Gait velocity: decr     General Gait Details: cues for safety  Stairs            Wheelchair Mobility     Tilt Bed    Modified Rankin (Stroke Patients Only)       Balance Overall balance assessment: History of Falls, Needs assistance Sitting-balance support: No upper extremity supported, Feet supported Sitting balance-Leahy Scale: Good     Standing balance support: Single extremity supported Standing balance-Leahy Scale: Fair Standing balance comment: performing hygiene in standing                             Pertinent Vitals/Pain Pain Assessment Pain Assessment: Faces Faces Pain Scale: Hurts even more Pain Location: all over Pain Descriptors / Indicators: Grimacing Pain Intervention(s):  Monitored during session    Home Living Family/patient expects to be discharged to:: Private  residence Living Arrangements: Spouse/significant other;Other relatives Available Help at Discharge: Family;Available 24 hours/day Type of Home: House Home Access: Ramped entrance Entrance Stairs-Rails: Can reach both;Left;Right Entrance Stairs-Number of Steps: 3   Home Layout: One level Home Equipment: Agricultural consultant (2 wheels);Cane - single point;Shower seat;Grab bars - tub/shower;BSC/3in1;Transport chair;Electric scooter Additional Comments: adjustable bed with rail.    Prior Function Prior Level of Function : History of Falls (last six months);Needs assist             Mobility Comments: uses cane or walker or WC for mobility, reports fall 2 weeks ago getting out of walk in shower. ADLs Comments: reports typically Modified Independent with ADLs (though has assist for socks at home), able to complete basic IADls "what I can. A Little bit of cooking". Pt no longer driving.     Hand Dominance   Dominant Hand: Right    Extremity/Trunk Assessment   Upper Extremity Assessment Upper Extremity Assessment: Overall WFL for tasks assessed    Lower Extremity Assessment Lower Extremity Assessment: Generalized weakness    Cervical / Trunk Assessment Cervical / Trunk Assessment: Normal  Communication   Communication: No difficulties  Cognition Arousal/Alertness: Awake/alert   Overall Cognitive Status: Within Functional Limits for tasks assessed                                 General Comments: patient followed directions well, O x 3, slow to  answer        General Comments      Exercises     Assessment/Plan    PT Assessment Patient needs continued PT services  PT Problem List Decreased strength;Decreased activity tolerance;Decreased mobility;Decreased range of motion;Decreased balance;Decreased knowledge of use of DME;Decreased safety awareness;Pain;Decreased knowledge of precautions       PT Treatment Interventions DME instruction;Stair  training;Therapeutic activities;Gait training;Functional mobility training;Therapeutic exercise;Patient/family education    PT Goals (Current goals can be found in the Care Plan section)  Acute Rehab PT Goals Patient Stated Goal: go home today PT Goal Formulation: With patient Time For Goal Achievement: 08/13/22 Potential to Achieve Goals: Fair    Frequency Min 1X/week     Co-evaluation               AM-PAC PT "6 Clicks" Mobility  Outcome Measure Help needed turning from your back to your side while in a flat bed without using bedrails?: None Help needed moving from lying on your back to sitting on the side of a flat bed without using bedrails?: A Little Help needed moving to and from a bed to a chair (including a wheelchair)?: A Little Help needed standing up from a chair using your arms (e.g., wheelchair or bedside chair)?: A Little Help needed to walk in hospital room?: A Lot Help needed climbing 3-5 steps with a railing? : A Lot 6 Click Score: 17    End of Session Equipment Utilized During Treatment: Gait belt Activity Tolerance: Patient tolerated treatment well Patient left: in chair;with call bell/phone within reach;with chair alarm set Nurse Communication: Mobility status PT Visit Diagnosis: Unsteadiness on feet (R26.81);History of falling (Z91.81)    Time: 7829-5621 PT Time Calculation (min) (ACUTE ONLY): 18 min   Charges:   PT Evaluation $PT Eval Low Complexity: 1 Low   PT General Charges $$ ACUTE PT VISIT: 1 Visit  Blanchard Kelch PT Acute Rehabilitation Services Office [phone] Weekend [phone]   Rada Hay 07/30/2022, 2:25 PM

## 2022-07-30 NOTE — Evaluation (Signed)
Occupational Therapy Evaluation Patient Details Name: [name] MRN: [id number] DOB: 08-16-1950 Today's Date: 07/30/2022   History of Present Illness 72 y.o. female with medical history significant of anxiety, depression, aortic atherosclerosis, bilateral cataracts carpal tunnel syndrome cervical cancer chronic back pain, claudication, COPD cervical DDD, lumbar DDD, blood transfusion, migraine headaches, hypertension, iron deficiency anemia, pulm nodule osteoarthritis, history of pneumonia, prediabetes, Raynaud's phenomenon, sleep apnea not on CPAP, spondylolysis, history of nonhemorrhagic CVA, tennis elbow syndrome, urinary urgency, varicose veins, colon polyps, GERD, gastroparesis, constipation, diverticulosis who is coming to the emergency department 07/29/22 with complaints of abdominal pain and constipation . CT Abdomen shows extensive diverticulitis of the sigmoid colon, Circumferential thickening of the rectum.   Clinical Impression   Patient is currently requiring assistance with ADLs including up to moderate assist with Lower body ADLs, up to minimal assist with Upper body ADLs,  as well as moderate assist with bed mobility and minimal assist with functional transfers to toilet with two significant losses of balance during ambulation from bathroom back to bed.    Current level of function may below patient's typical baseline.  During this evaluation, patient was limited by generalized weakness, impaired activity tolerance, impaired balance and cognitive deficits, all of which has the potential to impact patient's safety and independence during functional mobility, as well as performance for ADLs.  Patient lives at home, with her husband who is unable to provide supervision and assistance during the day and pt's 73 year old lives with pt and stays days for summer.   Patient demonstrates fair rehab potential, and should benefit from continued skilled occupational therapy services while in  acute care to maximize safety, independence and quality of life at home.  Continued occupational therapy services in the home are recommended.  ?     Recommendations for follow up therapy are one component of a multi-disciplinary discharge planning process, led by the attending physician.  Recommendations may be updated based on patient status, additional functional criteria and insurance authorization.   Assistance Recommended at Discharge Frequent or constant Supervision/Assistance  Patient can return home with the following A little help with walking and/or transfers;A little help with bathing/dressing/bathroom;Help with stairs or ramp for entrance;Assist for transportation;Assistance with cooking/housework;Direct supervision/assist for financial management;Direct supervision/assist for medications management    Functional Status Assessment  Patient has had a recent decline in their functional status and demonstrates the ability to make significant improvements in function in a reasonable and predictable amount of time.  Equipment Recommendations  None recommended by OT    Recommendations for Other Services       Precautions / Restrictions Precautions Precautions: Fall Restrictions Weight Bearing Restrictions: No      Mobility Bed Mobility Overal bed mobility: Needs Assistance Bed Mobility: Rolling, Sidelying to Sit Rolling: Min assist Sidelying to sit: Mod assist, HOB elevated       General bed mobility comments: cues to reach for rail, assist to sit upright; increased leg pain with need of stopping and reattempting x 3 to allow patient to recover.    Transfers                          Balance Overall balance assessment: History of Falls, Needs assistance Sitting-balance support: No upper extremity supported, Feet supported Sitting balance-Leahy Scale: Fair     Standing balance support: Bilateral upper extremity supported, Reliant on assistive device for  balance, During functional activity (2 losses of balance while mobilizing) Standing  balance-Leahy Scale: Poor                             ADL either performed or assessed with clinical judgement   ADL Overall ADL's : Needs assistance/impaired Eating/Feeding: Sitting;Modified independent   Grooming: Wash/dry hands;Cueing for safety;Cueing for sequencing;Min guard   Upper Body Bathing: Min guard;Sitting   Lower Body Bathing: Moderate assistance;Sitting/lateral leans;Sit to/from stand   Upper Body Dressing : Minimal assistance;Sitting   Lower Body Dressing: Moderate assistance;Sit to/from stand;Sitting/lateral leans   Toilet Transfer: Minimal assistance;Comfort height toilet;Rolling walker (2 wheels) Toilet Transfer Details (indicate cue type and reason): Pt stood from EOB to RW with sudden LOB and pt sitting back down to EOB with Min guard for safety. Pt performed 2nd stand from EOB with light Min Assist. Pt ambulated to commode with RW and Min guard assist. Required Min guard to lower to commode, did not follow cues to line herself [place] and lwoerred sideways. Reqyured Min As to stand from commode with cues for grab bar. On ambulating back to bed pt turned her head and experienced another loss of balance posteriorly, this time with need of min assist to correct.  Patient also demonstrated poor eccentric control with return to edge of bed sitting. Toileting- Clothing Manipulation and Hygiene: Minimal assistance;Sit to/from stand       Functional mobility during ADLs: Minimal assistance;Rolling walker (2 wheels)       Vision Baseline Vision/History: 1 Wears glasses Vision Assessment?: No apparent visual deficits     Perception     Praxis      Pertinent Vitals/Pain Pain Assessment Pain Assessment: 0-10 Pain Score: 10-Worst pain ever Faces Pain Scale: Hurts even more Pain Location: BLEs Pain Descriptors / Indicators: Guarding, Grimacing, Moaning Pain Intervention(s):  Limited activity within patient's tolerance, Monitored during session, Patient requesting pain meds-RN notified, Repositioned (RN in and offered Tylenol and pt reported that she was not supposed to use it.)     Hand Dominance Right   Extremity/Trunk Assessment Upper Extremity Assessment Upper Extremity Assessment: Generalized weakness   Lower Extremity Assessment Lower Extremity Assessment: Generalized weakness   Cervical / Trunk Assessment Cervical / Trunk Assessment: Normal   Communication Communication Communication: No difficulties   Cognition Arousal/Alertness: Awake/alert Behavior During Therapy: WFL for tasks assessed/performed Overall Cognitive Status: No family/caregiver present to determine baseline cognitive functioning Area of Impairment: Following commands, Problem solving, Orientation, Awareness, Safety/judgement                 Orientation Level: Disoriented to, Situation     Following Commands: Follows one step commands with increased time Safety/Judgement: Decreased awareness of deficits, Decreased awareness of safety Awareness: Emergent Problem Solving: Requires verbal cues, Difficulty sequencing, Slow processing General Comments: Pt gives general responses. Unsure of level of understanding. Does better with increased speech volume and stranding in front of pt.  Increased time for pt to find words.  Example: "I have a walk in shower."  Later pt mentioned, "I wishj I had a walk in shower."  OT clarified and explained difference between a walk in shower and tub/shower. Pt reported, "I have a walk-in shower."  OT confirmed verbally back, "You DO have a walk in shower?"  Pt responded,. "No".     General Comments       Exercises     Shoulder Instructions      Home Living Family/patient expects to be discharged to:: Private residence Living Arrangements: Spouse/significant  other;Other relatives Available Help at Discharge: Family;Available 24  hours/day Type of Home: House Home Access: Ramped entrance Entrance Stairs-Number of Steps: 3 Entrance Stairs-Rails: Can reach both;Left;Right Home Layout: One level     Bathroom Shower/Tub: Producer, television/film/video: Standard Bathroom Accessibility: Yes How Accessible: Accessible via walker Home Equipment: Rolling Walker (2 wheels);Cane - single point;Shower seat;Grab bars - tub/shower;BSC/3in1;Transport chair;Electric scooter   Additional Comments: adjustable bed with rail.      Prior Functioning/Environment Prior Level of Function : History of Falls (last six months);Needs assist             Mobility Comments: uses cane or walker or WC for mobility, reports fall 2 weeks ago getting out of walk in shower. ADLs Comments: reports typically Modified Independent with ADLs (though has assist for socks at home), able to complete basic IADls "what I can. A Little bit of cooking". Pt no longer driving.        OT Problem List: Decreased strength;Decreased cognition;Decreased safety awareness;Decreased activity tolerance;Obesity;Pain;Impaired balance (sitting and/or standing);Decreased knowledge of precautions      OT Treatment/Interventions: Self-care/ADL training;Therapeutic activities;Cognitive remediation/compensation;Patient/family education;Balance training;DME and/or AE instruction;Therapeutic exercise    OT Goals(Current goals can be found in the care plan section) Acute Rehab OT Goals Patient Stated Goal: Improve balance OT Goal Formulation: With patient Time For Goal Achievement: 08/13/22 Potential to Achieve Goals: Good ADL Goals Pt Will Perform Grooming: with supervision;standing Pt Will Transfer to Toilet: with supervision;ambulating Pt Will Perform Toileting - Clothing Manipulation and hygiene: with supervision;with adaptive equipment;sitting/lateral leans;sit to/from stand Pt/caregiver will Perform Home Exercise Program: Increased strength;Both right and  left upper extremity;With Supervision Additional ADL Goal #1: Patient will identify at least 3 fall prevention strategies to employ at home in order to maximize function and safety during ADLs and decrease caregiver burden while preventing possible injury and rehospitalization.  OT Frequency: Min 1X/week    Co-evaluation              AM-PAC OT "6 Clicks" Daily Activity     Outcome Measure Help from another person eating meals?: None Help from another person taking care of personal grooming?: A Little Help from another person toileting, which includes using toliet, bedpan, or urinal?: A Little Help from another person bathing (including washing, rinsing, drying)?: A Lot Help from another person to put on and taking off regular upper body clothing?: A Little Help from another person to put on and taking off regular lower body clothing?: A Lot 6 Click Score: 17   End of Session Equipment Utilized During Treatment: Gait belt;Rolling walker (2 wheels) Nurse Communication: Other (comment) (Losses of balance while up with OT as well as cognitive concerns.  RN stated the patient was doing well this morning with no problems until OT came to her room and disturbed patient.  Did explain to her that patient had been awake watching TV.)  Activity Tolerance: Patient limited by pain Patient left: in bed;with call bell/phone within reach;with bed alarm set  OT Visit Diagnosis: Unsteadiness on feet (R26.81);History of falling (Z91.81);Repeated falls (R29.6);Pain;Other symptoms and signs involving cognitive function;Other abnormalities of gait and mobility (R26.89) Pain - Right/Left:  (B) Pain - part of body: Leg                Time: 1349-1425 OT Time Calculation (min): 36 min Charges:  OT General Charges $OT Visit: 1 Visit OT Evaluation $OT Eval Low Complexity: 1 Low OT Treatments $Self Care/Home Management : 8-22 mins  Victorino Dike, OT Acute Rehab Services Office:  [phone] 07/30/2022  Theodoro Clock 07/30/2022, 3:09 PM

## 2022-07-30 NOTE — Progress Notes (Signed)
Initial Nutrition Assessment  DOCUMENTATION CODES:   Not applicable  INTERVENTION:  Continue Ensure Plus High Protein po BID, each supplement provides 350 kcal and 20 grams of protein.  Trial Magic cup BID with meals, each supplement provides 290 kcal and 9 grams of protein  Advance diet as tolerated   NUTRITION DIAGNOSIS:   Inadequate oral intake related to altered GI function as evidenced by percent weight loss, other (comment) (liquid diet).   GOAL:   Patient will meet greater than or equal to 90% of their needs   MONITOR:   PO intake, Supplement acceptance, Skin, I & O's, Diet advancement, Labs, Weight trends  REASON FOR ASSESSMENT:   Consult Assessment of nutrition requirement/status  ASSESSMENT:   72 y.o. female with PMHx including anxiety, depression, aortic arthrosclerosis, bilateral cataracts carpal tunnel syndrome, cervical cancer, chronic back pain, claudication, COPD, DDD, blood transfusion, migraine headaches, HTN, iron deficiency anemia, pulmonary nodule, osteoarthritis, history of pneumonia, prediabetes, Raynaud's phenomenon, sleep apnea not on CPAP, spondylolysis, history of nonhemorrhagic CVA, tennis elbow syndrome, urinary urgency, varicose veins, colon polyps, GERD, gastroparesis, constipation, diverticulosis who presents with abdominal pain and constipation for past 2 weeks. Patient admitted for acute diverticulitis  RD deferred patient visit today due to the lack of sleep she obtained last night due to disturbances on the unit. Her RN reports that she has mainly been eating the chocolate ice cream on  her tray. RD to trial chocolate magic cups.   RD to follow up on later date for interview and NFPE.   Labs: reviewed   Meds: rocephin, aspirin, ensure BID, reglan, flagyl, crestor, IVF   Wt: 10.2 kg (12%) wt loss x 3 months  07/29/22 73.9 kg  07/23/22 72.9 kg  07/12/22 70.3 kg  06/26/22 76.8 kg  05/28/22 77.8 kg  05/27/22 78.8 kg  04/30/22 80.5 kg   04/21/22 83 kg  03/20/22 84.1 kg   PO: no meals documented at this time  I/O's:  +600 ml  NUTRITION - FOCUSED PHYSICAL EXAM:  Deferred   Diet Order:   Diet Order             Diet full liquid Fluid consistency: Thin  Diet effective now                   EDUCATION NEEDS:   No education needs have been identified at this time  Skin:  Skin Assessment: Skin Integrity Issues: Skin Integrity Issues:: Incisions Incisions: L pretibial  Last BM:  7/16 type 2  Height:   Ht Readings from Last 1 Encounters:  07/29/22 5' (1.524 m)    Weight:   Wt Readings from Last 1 Encounters:  07/29/22 73.9 kg    Ideal Body Weight:     BMI:  Body mass index is 31.83 kg/m.  Estimated Nutritional Needs:   Kcal:  1600-1850  Protein:  90-110 g  Fluid:  > 1.8 L    Leodis Rains, RDN, LDN  Clinical Nutrition

## 2022-07-30 NOTE — Progress Notes (Signed)
[facility]                                                                               [name], is a 72 y.o. female, DOB - [birth year], WUJ:[id number] Admit date - 07/29/2022    Outpatient Primary MD for the patient is [name], NP  LOS - 1  days    Brief summary   72 y.o. female with medical history significant of anxiety, depression, aortic atherosclerosis, bilateral cataracts carpal tunnel syndrome cervical cancer chronic back pain, claudication, COPD cervical DDD, lumbar DDD, blood transfusion, migraine headaches, hypertension, iron deficiency anemia, pulm nodule osteoarthritis, history of pneumonia, prediabetes, Raynaud's phenomenon, sleep apnea not on CPAP, spondylolysis, history of nonhemorrhagic CVA, tennis elbow syndrome, urinary urgency, varicose veins, colon polyps, GERD, gastroparesis, constipation, diverticulosis who is coming to the emergency department with complaints of abdominal pain and constipation for the past 2 weeks, .   CT Abdomen shows extensive diverticulitis of the sigmoid colon. Circumferential thickening of the rectum.   Assessment & Plan    Assessment and Plan:  Acute diverticulitis of the sigmoid colon  Started her on IV antibiotics, liquid diet for today.  Pain control.  Last colonoscopy was 09/2021.  IV fluids, and IV anti emetics.  If symptoms do not improve in the next 24 hours, please consult general surgery.    Hypokalemia Replaced.    Hyperlipidemia: resume Crestor.     Restless leg syndrome;  Resume Requip.    Chronic pain  Resume home pain meds.    Hyponatremia Resolved.    Hypertension:  Sub optimal, possibly a component of pain.  Restart home meds and hydralazine   H/o anxiety and depression:  Restarted home meds.  Ativan PRN .     Anemia of chronic disease;  Hemoglobin stable around 8.    Mild thrombocytopenia:  Monitor.    Disposition:  Pending therapy evaluations.   Estimated  body mass index is 31.83 kg/m as calculated from the following:   Height as of this encounter: 5' (1.524 m).   Weight as of this encounter: 73.9 kg.  Code Status: full code.  DVT Prophylaxis:  SCDs Start: 07/29/22 1851   Level of Care: Level of care: Med-Surg Family Communication:none at bedside.   Disposition Plan:     Remains inpatient appropriate:  IV antibiotics for acute diverticulitis.   Procedures:  CT abd and pelvis.   Consultants:   None.   Antimicrobials:   Anti-infectives (From admission, onward)    Start     Dose/Rate Route Frequency Ordered Stop   07/30/22 1700  cefTRIAXone (ROCEPHIN) 2 g in sodium chloride 0.9 % 100 mL IVPB        2 g 200 mL/hr over 30 Minutes Intravenous Every 24 hours 07/29/22 1856     07/30/22 0600  metroNIDAZOLE (FLAGYL) IVPB 500 mg        500 mg 100 mL/hr over 60 Minutes Intravenous Every 12 hours 07/29/22 1856     07/29/22 1715  cefTRIAXone (ROCEPHIN) 2 g in sodium chloride 0.9 % 100 mL IVPB       Placed in "And" Linked Group   2 g 200 mL/hr  over 30 Minutes Intravenous  Once 07/29/22 1713 07/29/22 1807   07/29/22 1715  metroNIDAZOLE (FLAGYL) IVPB 500 mg       Placed in "And" Linked Group   500 mg 100 mL/hr over 60 Minutes Intravenous  Once 07/29/22 1713 07/30/22 0700        Medications  Scheduled Meds:  aspirin EC  81 mg Oral Daily   DULoxetine  60 mg Oral BID   feeding supplement  237 mL Oral BID BM   metoCLOPramide (REGLAN) injection  5 mg Intravenous Q6H   metoprolol succinate  25 mg Oral Daily   rOPINIRole  1 mg Oral Daily   rOPINIRole  2 mg Oral QHS   rosuvastatin  10 mg Oral QHS   traZODone  50 mg Oral QHS   Continuous Infusions:  0.9 % NaCl with KCl 20 mEq / L 100 mL/hr at 07/30/22 0904   cefTRIAXone (ROCEPHIN)  IV     metronidazole 500 mg (07/30/22 0539)   PRN Meds:.acetaminophen **OR** acetaminophen, albuterol, HYDROmorphone (DILAUDID) injection, ondansetron **OR** ondansetron (ZOFRAN) IV, oxyCODONE,  pantoprazole, senna    Subjective:   [name] was seen and examined today.  Not feeling good, still has abdominal pain.   Objective:   Vitals:   07/29/22 1916 07/29/22 1944 07/30/22 0024 07/30/22 0730  BP: (!) 179/73  (!) 157/76 (!) 153/71  Pulse: 70  93 86  Resp: 16  16 18   Temp: 97.7 F (36.5 C)  98.2 F (36.8 C) 98.5 F (36.9 C)  TempSrc: Oral  Oral Oral  SpO2: (!) 88% 100% 99% 96%  Weight:      Height:        Intake/Output Summary (Last 24 hours) at 07/30/2022 1102 Last data filed at 07/29/2022 1807 Gross per 24 hour  Intake 600 ml  Output --  Net 600 ml   Filed Weights   07/29/22 1452 07/29/22 1453  Weight: 60.3 kg 73.9 kg     Exam General exam: Ill appearing elderly lady, not in distress.  Respiratory system: Clear to auscultation. Respiratory effort normal. Cardiovascular system: S1 & S2 heard, RRR. No JVD, murmurs,  Gastrointestinal system: Abdomen is soft, moderate generalized tenderness. Bs+ Central nervous system: Alert and oriented. No focal neurological deficits. Extremities: No pedal edema.  Skin: No rashes, Psychiatry: Mood & affect appropriate.     Data Reviewed:  I have personally reviewed following labs and imaging studies   CBC Lab Results  Component Value Date   WBC 4.2 07/30/2022   RBC 2.67 (L) 07/30/2022   HGB 8.1 (L) 07/30/2022   HCT 25.8 (L) 07/30/2022   MCV 96.6 07/30/2022   MCH 30.3 07/30/2022   PLT 145 (L) 07/30/2022   MCHC 31.4 07/30/2022   RDW 15.9 (H) 07/30/2022   LYMPHSABS 0.6 (L) 07/29/2022   MONOABS 0.3 07/29/2022   EOSABS 0.1 07/29/2022   BASOSABS 0.0 07/29/2022     Last metabolic panel Lab Results  Component Value Date   NA 138 07/30/2022   K 3.8 07/30/2022   CL 106 07/30/2022   CO2 23 07/30/2022   BUN 7 (L) 07/30/2022   CREATININE 0.73 07/30/2022   GLUCOSE 96 07/30/2022   GFRNONAA >60 07/30/2022   GFRAA 89 09/28/2019   CALCIUM 8.5 (L) 07/30/2022   PROT 6.3 (L) 07/30/2022   ALBUMIN 3.2 (L)  07/30/2022   LABGLOB 2.8 09/28/2019   AGRATIO 1.5 09/28/2019   BILITOT 0.2 (L) 07/30/2022   ALKPHOS 63 07/30/2022   AST 17 07/30/2022  ALT 13 07/30/2022   ANIONGAP 9 07/30/2022    CBG (last 3)  No results for input(s): "GLUCAP" in the last 72 hours.    Coagulation Profile: No results for input(s): "INR", "PROTIME" in the last 168 hours.   Radiology Studies: CT ABDOMEN PELVIS W CONTRAST  Result Date: 07/29/2022 CLINICAL DATA:  Left lower quadrant abdominal pain. EXAM: CT ABDOMEN AND PELVIS WITH CONTRAST TECHNIQUE: Multidetector CT imaging of the abdomen and pelvis was performed using the standard protocol following bolus administration of intravenous contrast. RADIATION DOSE REDUCTION: This exam was performed according to the departmental dose-optimization program which includes automated exposure control, adjustment of the mA and/or kV according to patient size and/or use of iterative reconstruction technique. CONTRAST:  OMNIPAQUE IOHEXOL 300 MG/ML  SOLN COMPARISON:  July 12, 2022 FINDINGS: Lower chest: Chronic interstitial lung changes. Previously noted minute pulmonary nodules in the right middle lobe are stable. Hepatobiliary: No focal liver abnormality is seen. Status post cholecystectomy. No biliary dilatation. Pancreas: Unremarkable. No pancreatic ductal dilatation or surrounding inflammatory changes. Spleen: Normal in size without focal abnormality. Adrenals/Urinary Tract: Adrenal glands are unremarkable. Kidneys are normal, without renal calculi, focal lesion, or hydronephrosis. Bladder is unremarkable. Stomach/Bowel: Stomach is within normal limits. Appendix appears normal. No evidence of small bowel wall thickening, distention, or inflammatory changes. Extensive sigmoid colon diverticulitis. Circumferential thickening of the rectum. Vascular/Lymphatic: No significant vascular findings are present. No enlarged abdominal or pelvic lymph nodes. Reproductive: Status post  hysterectomy. No adnexal masses. Other: No abdominal wall hernia or abnormality. No abdominopelvic ascites. Musculoskeletal: Stable sclerotic changes in the spine. Stable compression deformity of T12 vertebral body. IMPRESSION: 1. Extensive sigmoid colon diverticulitis. 2. Circumferential thickening of the rectum. Please correlate with clinical symptoms of proctitis versus internal hemorrhoids. 3. Stable sclerotic changes in the spine. 4. Stable compression deformity of T12 vertebral body. 5. Chronic interstitial lung changes. 6. Previously noted minute pulmonary nodules in the right middle lobe are stable. Electronically Signed   By: Ted Mcalpine M.D.   On: 07/29/2022 17:05       Kathlen Mody M.D. [facility] 07/30/2022, 11:02 AM  Available via Epic secure chat 7am-7pm After 7 pm, please refer to night coverage provider listed on amion.

## 2022-07-31 ENCOUNTER — Other Ambulatory Visit (HOSPITAL_COMMUNITY): Payer: Self-pay

## 2022-07-31 DIAGNOSIS — K5909 Other constipation: Secondary | ICD-10-CM

## 2022-07-31 DIAGNOSIS — K5792 Diverticulitis of intestine, part unspecified, without perforation or abscess without bleeding: Secondary | ICD-10-CM | POA: Diagnosis not present

## 2022-07-31 DIAGNOSIS — K5289 Other specified noninfective gastroenteritis and colitis: Secondary | ICD-10-CM | POA: Insufficient documentation

## 2022-07-31 DIAGNOSIS — R1032 Left lower quadrant pain: Secondary | ICD-10-CM

## 2022-07-31 LAB — BASIC METABOLIC PANEL
Anion gap: 8 (ref 5–15)
BUN: 7 mg/dL — ABNORMAL LOW (ref 8–23)
CO2: 23 mmol/L (ref 22–32)
Calcium: 8.5 mg/dL — ABNORMAL LOW (ref 8.9–10.3)
Chloride: 101 mmol/L (ref 98–111)
Creatinine, Ser: 0.5 mg/dL (ref 0.44–1.00)
GFR, Estimated: >60 mL/min (ref >60)
Glucose, Bld: 91 mg/dL (ref 70–99)
Potassium: 3.2 mmol/L — ABNORMAL LOW (ref 3.5–5.1)
Sodium: 132 mmol/L — ABNORMAL LOW (ref 135–145)

## 2022-07-31 LAB — RETICULOCYTES
Immature Retic Fract: 24.5 % — ABNORMAL HIGH (ref 2.3–15.9)
RBC.: 2.82 MIL/uL — ABNORMAL LOW (ref 3.87–5.11)
Retic Count, Absolute: 93.3 10*3/uL (ref 19.0–186.0)
Retic Ct Pct: 3.3 % — ABNORMAL HIGH (ref 0.4–3.1)

## 2022-07-31 LAB — VITAMIN B12: Vitamin B-12: 394 pg/mL (ref 180–914)

## 2022-07-31 LAB — CBC WITH DIFFERENTIAL/PLATELET
Abs Immature Granulocytes: 0.01 10*3/uL (ref 0.00–0.07)
Basophils Absolute: 0 10*3/uL (ref 0.0–0.1)
Basophils Relative: 0 %
Eosinophils Absolute: 0.3 10*3/uL (ref 0.0–0.5)
Eosinophils Relative: 7 %
HCT: 26.7 % — ABNORMAL LOW (ref 36.0–46.0)
Hemoglobin: 8.5 g/dL — ABNORMAL LOW (ref 12.0–15.0)
Immature Granulocytes: 0 %
Lymphocytes Relative: 14 %
Lymphs Abs: 0.6 10*3/uL — ABNORMAL LOW (ref 0.7–4.0)
MCH: 29.9 pg (ref 26.0–34.0)
MCHC: 31.8 g/dL (ref 30.0–36.0)
MCV: 94 fL (ref 80.0–100.0)
Monocytes Absolute: 0.3 10*3/uL (ref 0.1–1.0)
Monocytes Relative: 7 %
Neutro Abs: 3.2 10*3/uL (ref 1.7–7.7)
Neutrophils Relative %: 72 %
Platelets: 135 10*3/uL — ABNORMAL LOW (ref 150–400)
RBC: 2.84 MIL/uL — ABNORMAL LOW (ref 3.87–5.11)
RDW: 15.7 % — ABNORMAL HIGH (ref 11.5–15.5)
WBC: 4.5 10*3/uL (ref 4.0–10.5)
nRBC: 0 % (ref 0.0–0.2)

## 2022-07-31 LAB — IRON AND TIBC
Iron: 41 ug/dL (ref 28–170)
Saturation Ratios: 10 % — ABNORMAL LOW (ref 10.4–31.8)
TIBC: 398 ug/dL (ref 250–450)
UIBC: 357 ug/dL

## 2022-07-31 LAB — FERRITIN: Ferritin: 23 ng/mL (ref 11–307)

## 2022-07-31 LAB — T4, FREE: Free T4: 0.98 ng/dL (ref 0.61–1.12)

## 2022-07-31 LAB — FOLATE: Folate: 13.2 ng/mL (ref >5.9)

## 2022-07-31 MED ORDER — LOSARTAN POTASSIUM 50 MG PO TABS
25.0000 mg | ORAL_TABLET | Freq: Every day | ORAL | Status: DC
  Administered 2022-07-31 – 2022-08-01 (×2): 25 mg via ORAL
  Filled 2022-07-31 (×2): qty 1

## 2022-07-31 MED ORDER — LINACLOTIDE 145 MCG PO CAPS
290.0000 ug | ORAL_CAPSULE | Freq: Every day | ORAL | Status: DC
  Administered 2022-08-01: 290 ug via ORAL
  Filled 2022-07-31: qty 2

## 2022-07-31 MED ORDER — NICOTINE 14 MG/24HR TD PT24
14.0000 mg | MEDICATED_PATCH | Freq: Every day | TRANSDERMAL | Status: DC
  Administered 2022-07-31 – 2022-08-01 (×2): 14 mg via TRANSDERMAL
  Filled 2022-07-31 (×2): qty 1

## 2022-07-31 MED ORDER — POLYETHYLENE GLYCOL 3350 17 G PO PACK
34.0000 g | PACK | Freq: Two times a day (BID) | ORAL | Status: DC
  Administered 2022-07-31 – 2022-08-01 (×2): 34 g via ORAL
  Filled 2022-07-31 (×3): qty 2

## 2022-07-31 MED ORDER — SORBITOL 70 % SOLN
400.0000 mL | TOPICAL_OIL | Freq: Once | ORAL | Status: AC
  Administered 2022-07-31: 400 mL via RECTAL
  Filled 2022-07-31: qty 120

## 2022-07-31 MED ORDER — POTASSIUM CHLORIDE CRYS ER 20 MEQ PO TBCR
40.0000 meq | EXTENDED_RELEASE_TABLET | Freq: Once | ORAL | Status: AC
  Administered 2022-07-31: 40 meq via ORAL
  Filled 2022-07-31: qty 2

## 2022-07-31 MED ORDER — POLYETHYLENE GLYCOL 3350 17 G PO PACK: 17.0000 g | PACK | Freq: Two times a day (BID) | ORAL | Status: DC

## 2022-07-31 NOTE — Progress Notes (Signed)
Consultation  Referring Provider:     [facility] Primary Care Physician:  [name], NP Primary Gastroenterologist:        Marina Goodell Reason for Consultation:     Abnormal rectum on CT     Impression / Plan:   Circumferential rectal thickening on CT scan of the abdomen and pelvis that is likely related to known hemorrhoids plus or minus solitary rectal ulcer syndrome.  Left lower quadrant pain and severe diverticulosis, probably some symptomatic diverticulosis.  She reports that she feels much better today.  Chronic constipation contributing to above.  CT does show a fair amount of stool in the rectum so stercoral ulcers are also possible.  ------------------------------------------------------------------------------------------------ No workup needed.  Continue laxative regimen   Will have nursing staff check for impaction.  Also administer smog enema.  May be able to go home tomorrow.  Iva Boop, MD, [facility] [place] [facility] See Loretha Stapler on call - gastroenterology for best contact person 07/31/2022 6:00 PM        HPI:   [name] is a 72 y.o. female admitted to the hospital with suspected diverticulitis, CT initially reported that way but upon review suggested it was severe diverticulosis.  She has circumferential thickening of the rectum.  She has chronic constipation she has a new diagnosis of lung cancer and is undergoing treatment and her daughter thinks her memory is off since then.  The patient reports her left lower quadrant pain is better now.  Last year she was admitted in August with symptomatic anemia.  She had EGD which was unrevealing and a colonoscopy with several small ulcers that were thought to be due to solitary rectal ulcer syndrome.  She does have chronic constipation.  She is not sure what her laxative regimen was at home.  She is not having bleeding here.  She cannot recall her last bowel movement.  Several are recorded since  yesterday.      Past Medical History:  Diagnosis Date   Anxiety    Aortic atherosclerosis (HCC)    Bilateral cataracts    Carpal tunnel syndrome    Cervical cancer (HCC)    Chronic back pain    Claudication (HCC)    COPD (chronic obstructive pulmonary disease) (HCC)    wheezing   DDD (degenerative disc disease), cervical    DDD (degenerative disc disease), lumbar    Depression    Diverticulosis    Dyspnea    on exertion   Gastroparesis    GERD (gastroesophageal reflux disease)    Hearing loss    Bilateral   History of blood transfusion    History of colon polyps    History of migraine    History of radiation therapy    Left scapula,left chest, right pelvis- 12/20/21-01/02/22- Dr. 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   rt   Urge incontinence of urine    Varicose vein of leg    Venous insufficiency     Past Surgical History:  Procedure Laterality Date   BACK SURGERY     BIOPSY  08/31/2021   Procedure: BIOPSY;  Surgeon: Tressia Danas, MD;  Location: [facility] ENDOSCOPY;  Service: Gastroenterology;;   BLEPHAROPLASTY  10/10/2017   BRONCHIAL BIOPSY  11/21/2021   Procedure: BRONCHIAL BIOPSIES;  Surgeon: Oretha Milch, MD;  Location: [facility] ENDOSCOPY;  Service: Cardiopulmonary;;   BRONCHIAL BRUSHINGS  11/21/2021   Procedure: BRONCHIAL BRUSHINGS;  Surgeon: Oretha Milch, MD;  Location: [facility] ENDOSCOPY;  Service: Cardiopulmonary;;   BRONCHIAL NEEDLE ASPIRATION BIOPSY  11/21/2021   Procedure:  BRONCHIAL NEEDLE ASPIRATION BIOPSIES;  Surgeon: Oretha Milch, MD;  Location: [facility] ENDOSCOPY;  Service: Cardiopulmonary;;   BRONCHIAL WASHINGS  11/21/2021   Procedure: BRONCHIAL WASHINGS;  Surgeon: Oretha Milch, MD;  Location: [facility] ENDOSCOPY;  Service: Cardiopulmonary;;   CATARACT EXTRACTION, BILATERAL     CESAREAN SECTION     CHOLECYSTECTOMY     COLONOSCOPY  10/2016   COLONOSCOPY WITH PROPOFOL N/A 08/31/2021   Procedure: COLONOSCOPY WITH PROPOFOL;  Surgeon: Tressia Danas, MD;  Location: [facility] ENDOSCOPY;  Service: Gastroenterology;  Laterality: N/A;   ESOPHAGOGASTRODUODENOSCOPY (EGD) WITH PROPOFOL N/A 08/31/2021   Procedure: ESOPHAGOGASTRODUODENOSCOPY (EGD) WITH PROPOFOL;  Surgeon: Tressia Danas, MD;  Location: [facility] ENDOSCOPY;  Service: Gastroenterology;  Laterality: N/A;   HAND SURGERY Bilateral    carpal tunnel   HEMORRHOID SURGERY     HEMOSTASIS CONTROL  11/21/2021   Procedure: HEMOSTASIS CONTROL;  Surgeon: Oretha Milch, MD;  Location: [facility] ENDOSCOPY;  Service: Cardiopulmonary;;   INTERSTIM IMPLANT PLACEMENT     IR IMAGING GUIDED PORT INSERTION  01/17/2022   KNEE SURGERY Left    arthroscopy x3   LOWER EXTREMITY ANGIOGRAPHY N/A 07/16/2016   Procedure: Lower Extremity Angiography;  Surgeon: Yates Decamp, MD;  Location: [facility] INVASIVE CV LAB;  Service: Cardiovascular;  Laterality: N/A;   LOWER EXTREMITY INTERVENTION N/A 08/06/2016   Procedure: Lower Extremity Intervention;  Surgeon: Yates Decamp, MD;  Location: [facility] INVASIVE CV LAB;  Service: Cardiovascular;  Laterality: N/A;   NECK SURGERY     OTHER SURGICAL HISTORY  2013   bladder stimulator in back   PERIPHERAL VASCULAR BALLOON ANGIOPLASTY  08/06/2016   Procedure: Peripheral Vascular Balloon Angioplasty;  Surgeon: Yates Decamp, MD;  Location: [facility] INVASIVE CV LAB;  Service: Cardiovascular;;  Right SFA   POLYPECTOMY  08/31/2021   Procedure: POLYPECTOMY;  Surgeon: Tressia Danas, MD;  Location: [facility] ENDOSCOPY;  Service: Gastroenterology;;   TENNIS ELBOW  RELEASE/NIRSCHEL PROCEDURE Right 12/2015   TOE SURGERY Right    right foot second and fourth toe   TOTAL HIP ARTHROPLASTY Left    TOTAL HIP REVISION Left 10/13/2017   Procedure: LEFT TOTAL HIP REVISION ACETABULUM, OPEN REDUCTION INTERNAL WITH BONE GRAFT FIXATION LEFT GREATER TROCHANTERIC FRACTURE;  Surgeon: Gean Birchwood, MD;  Location: [facility] ORS;  Service: Orthopedics;  Laterality: Left;   TOTAL KNEE ARTHROPLASTY Right 01/12/2016   Procedure: RIGHT TOTAL KNEE ARTHROPLASTY;  Surgeon: Beverely Low, MD;  Location: [facility] OR;  Service: Orthopedics;  Laterality: Right;   TUBAL LIGATION     UPPER GI ENDOSCOPY  10/2016   VAGINAL HYSTERECTOMY     VIDEO BRONCHOSCOPY WITH ENDOBRONCHIAL ULTRASOUND N/A 11/21/2021   Procedure: VIDEO BRONCHOSCOPY WITH ENDOBRONCHIAL ULTRASOUND;  Surgeon: Oretha Milch, MD;  Location: [facility] ENDOSCOPY;  Service: Cardiopulmonary;  Laterality: N/A;    Family History  Problem Relation Age of Onset   Hyperlipidemia Mother    Hypertension Mother    Anxiety disorder Mother    Depression Mother    Hyperlipidemia Father    Hypertension Father    Alzheimer's disease Father    Anxiety disorder Brother    Anxiety disorder Brother    Anxiety disorder Brother    Drug abuse Brother    Stroke Brother    Cancer Daughter        unknown   Colon cancer Neg Hx    Esophageal cancer Neg Hx    Rectal cancer Neg Hx    Stomach cancer Neg Hx     Social History   Tobacco Use   Smoking status: Every Day    Current packs/day: 0.50    Average packs/day: 0.5 packs/day for 55.0 years (27.5 ttl pk-yrs)    Types: Cigarettes    Passive exposure: Past   Smokeless tobacco: Never   Tobacco comments:    Pt states she smokes a half pack daily.  Trying to quit.  Has patches, afraid to use with chemo/radiation.  Vaping Use   Vaping status: Never Used  Substance Use Topics   Alcohol use: No    Alcohol/week: 0.0 standard drinks of alcohol   Drug use: Never    Prior to Admission medications    Medication Sig Start Date End Date Taking? Authorizing Provider  acetaminophen (TYLENOL) 325 MG tablet Take 650 mg by mouth 2 (two) times daily as needed for headache or mild pain.   Yes [provider]  albuterol (PROVENTIL) (2.5 MG/3ML) 0.083% nebulizer solution Inhale 3 mLs into the lungs every 4 (four) hours as needed for wheezing or shortness of breath (cough). Patient taking differently: Inhale 3 mLs into the lungs 2 (two) times daily as needed for wheezing or shortness of breath (cough). 09/01/21  Yes Espinoza, Myrlene Broker, DO  albuterol (VENTOLIN HFA) 108 (90 Base) MCG/ACT inhaler Inhale 2 puffs into the lungs 2 (two) times daily as needed for wheezing or shortness of breath.   Yes [provider]  aspirin EC (ASPIRIN 81) 81 MG tablet Take 1 tablet (81 mg total) by mouth daily. 03/28/22  Yes   DULoxetine (CYMBALTA) 60 MG capsule Take 2 capsules (120 mg total) by mouth daily. Patient taking differently: Take 60 mg by mouth in the morning and at bedtime. 03/28/22  Yes   hydrocortisone (ANUSOL-HC) 25 MG suppository Place 1 suppository (25 mg total) rectally 2 (two) times daily as needed for hemorrhoids or anal itching. 09/01/21  Yes Sabino Dick, DO  lidocaine-prilocaine (EMLA) cream Apply 30 minutes before appointment 02/27/22  Yes Heilingoetter, Cassandra L, PA-C  linaclotide (LINZESS) 290 MCG CAPS capsule Take 1 capsule (290 mcg total) by mouth daily before breakfast. Patient taking differently: Take 290 mcg by mouth daily as needed (for constipation). 07/11/22  Yes Unk Lightning, PA  LORazepam (ATIVAN) 0.5 MG tablet Take 1 tablet 30 minutes before your MRI Patient taking differently: Take 0.5 mg by mouth See admin instructions. Take 0.5 mg by mouth 30 minutes before MRI 07/23/22  Yes Heilingoetter, Cassandra L, PA-C  methocarbamol (ROBAXIN) 500 MG tablet Take 1 tablet (500 mg total) by mouth every 6 hours as needed. Patient taking differently: Take 500 mg by mouth  every 6 (six) hours as needed for muscle spasms. 07/02/22  Yes   metoprolol succinate (TOPROL-XL) 25 MG 24 hr tablet TAKE 1 TABLET BY MOUTH DAILY. TAKE WITH  OR IMMEDIATELY FOLLOWING A MEAL. 07/22/22  Yes Yates Decamp, MD  nitroGLYCERIN (NITROSTAT) 0.4 MG SL tablet Place 1 tablet (0.4 mg total) under the tongue every 5 (five) minutes x 3 doses as needed for chest pain. 05/27/22  Yes Yates Decamp, MD  ondansetron (ZOFRAN) 8 MG tablet Take 1 tablet (8 mg total) by mouth every 8 (eight) hours as needed for nausea or vomiting. 06/26/22  Yes Heilingoetter, Cassandra L, PA-C  Oxycodone HCl 10 MG TABS Take 1 tablet (10 mg total) by mouth every 4 hours as needed for pain 07/02/22  Yes   pantoprazole (PROTONIX) 40 MG tablet Take 1 tablet (40 mg total) by mouth daily as needed (acid reflux). 04/30/22  Yes Heilingoetter, Cassandra L, PA-C  prochlorperazine (COMPAZINE) 10 MG tablet Take 1 tablet (10 mg total) by mouth every 6 (six) hours as needed for nausea or vomiting. 06/26/22  Yes Heilingoetter, Cassandra L, PA-C  rOPINIRole (REQUIP) 1 MG tablet Take 1 tablet (1 mg total) by mouth 3 (three) times daily as directed Patient taking differently: Take 1-2 mg by mouth See admin instructions. Take 1 mg by mouth in the morning and 2 mg at bedtime 03/28/22  Yes   rosuvastatin (CRESTOR) 10 MG tablet Take 10 mg by mouth at bedtime. 06/10/20  Yes [provider]  senna (SENOKOT) 8.6 MG tablet Take 17.2 mg by mouth daily as needed for constipation.   Yes [provider]  Tiotropium Bromide-Olodaterol (STIOLTO RESPIMAT) 2.5-2.5 MCG/ACT AERS Inhale 2 puffs into the lungs daily. Patient taking differently: Inhale 2 puffs into the lungs daily as needed (for respiratory flares). 11/09/21  Yes Parrett, Tammy S, NP  traZODone (DESYREL) 50 MG tablet TAKE 0.5-1 TABLETS (25-50 MG TOTAL) BY MOUTH AT BEDTIME AS NEEDED FOR SLEEP. Patient taking differently: Take 50 mg by mouth at bedtime. 03/16/18  Yes Eappen, Levin Bacon, MD  busPIRone  (BUSPAR) 5 MG tablet Take 1 tablet (5 mg total) by mouth 2 (two) times daily. Patient not taking: Reported on 07/29/2022 02/18/18   Jomarie Longs, MD  cephALEXin (KEFLEX) 500 MG capsule Take 1 capsule (500 mg total) by mouth 4 (four) times daily. 07/23/22   Heilingoetter, Cassandra L, PA-C  DULoxetine (CYMBALTA) 60 MG capsule Take 2 capsules (120 mg total) by mouth daily. Patient not taking: Reported on 07/29/2022 07/02/22     fluconazole (DIFLUCAN) 100 MG tablet Take 1 tablet (100 mg total) by mouth daily. 07/23/22   Heilingoetter, Cassandra L, PA-C  losartan (COZAAR) 25 MG tablet Take 25 mg by mouth daily. 10/08/21   [provider]  metoCLOPramide (REGLAN) 5 MG tablet Take 1 tablet by mouth 3 times daily 20-30 minutes before meals NO FURTHER REFILLS UNTIL SEEN, NEEDS AND APPOINTMENT Patient not taking: Reported on 07/29/2022 02/19/21   Hilarie Fredrickson, MD  mupirocin ointment (BACTROBAN) 2 % Apply 1 Application topically 2 (two) times daily. 07/23/22   Heilingoetter, Cassandra L, PA-C  Oxycodone HCl 10 MG TABS Take 10 mg by mouth 5 (five) times daily as needed (for moderate pain). Patient not taking: Reported on 07/29/2022 09/13/21   [provider]  potassium chloride SA (KLOR-CON M) 20 MEQ tablet Take 1 tablet (20 mEq total) by mouth 2 (two) times daily. Patient not taking: Reported on 07/29/2022 03/13/22   Heilingoetter, Cassandra L, PA-C  potassium chloride SA (KLOR-CON M) 20 MEQ tablet Take 1 tablet (20 mEq total) by mouth 2 (two) times daily. Patient not taking: Reported on 07/29/2022 05/28/22   Si Gaul, MD  Current Facility-Administered Medications  Medication Dose Route Frequency Provider Last Rate Last Admin   acetaminophen (TYLENOL) tablet 650 mg  650 mg Oral Q6H PRN Bobette Mo, MD       Or   acetaminophen (TYLENOL) suppository 650 mg  650 mg Rectal Q6H PRN Bobette Mo, MD       albuterol (PROVENTIL) (2.5 MG/3ML) 0.083% nebulizer solution 3 mL  3 mL  Nebulization Q4H PRN Bobette Mo, MD       aspirin EC tablet 81 mg  81 mg Oral Daily Bobette Mo, MD   81 mg at 07/31/22 1610   cefTRIAXone (ROCEPHIN) 2 g in sodium chloride 0.9 % 100 mL IVPB  2 g Intravenous Q24H Bobette Mo, MD 200 mL/hr at 07/31/22 1727 2 g at 07/31/22 1727   DULoxetine (CYMBALTA) DR capsule 60 mg  60 mg Oral BID Bobette Mo, MD   60 mg at 07/31/22 0924   feeding supplement (ENSURE ENLIVE / ENSURE PLUS) liquid 237 mL  237 mL Oral BID BM Kathlen Mody, MD   237 mL at 07/31/22 0924   HYDROmorphone (DILAUDID) injection 1 mg  1 mg Intravenous Q4H PRN Bobette Mo, MD       [START ON 08/01/2022] linaclotide (LINZESS) capsule 290 mcg  290 mcg Oral QAC breakfast Juliet Rude, PA-C       losartan (COZAAR) tablet 25 mg  25 mg Oral Daily Pamella Pert M, MD   25 mg at 07/31/22 1243   metoCLOPramide (REGLAN) injection 5 mg  5 mg Intravenous Q6H Bobette Mo, MD   5 mg at 07/31/22 1724   metoprolol succinate (TOPROL-XL) 24 hr tablet 25 mg  25 mg Oral Daily Bobette Mo, MD   25 mg at 07/31/22 0924   metroNIDAZOLE (FLAGYL) IVPB 500 mg  500 mg Intravenous Q12H Bobette Mo, MD 100 mL/hr at 07/31/22 0534 500 mg at 07/31/22 0534   nitroGLYCERIN (NITROSTAT) SL tablet 0.4 mg  0.4 mg Sublingual Q5 Min x 3 PRN Kathlen Mody, MD       ondansetron (ZOFRAN) tablet 4 mg  4 mg Oral Q6H PRN Bobette Mo, MD       Or   ondansetron [facility]) injection 4 mg  4 mg Intravenous Q6H PRN Bobette Mo, MD       oxyCODONE (Oxy IR/ROXICODONE) immediate release tablet 10 mg  10 mg Oral Q6H PRN Bobette Mo, MD   10 mg at 07/31/22 1726   pantoprazole (PROTONIX) EC tablet 40 mg  40 mg Oral Daily PRN Bobette Mo, MD       polyethylene glycol [facility] Ethelene Hal) packet 34 g  34 g Oral BID Karie Soda, MD       rOPINIRole (REQUIP) tablet 1 mg  1 mg Oral Daily Bobette Mo, MD   1 mg at 07/31/22 9604   rOPINIRole  (REQUIP) tablet 2 mg  2 mg Oral QHS Bobette Mo, MD   2 mg at 07/30/22 2018   rosuvastatin (CRESTOR) tablet 10 mg  10 mg Oral QHS Bobette Mo, MD   10 mg at 07/30/22 2017   senna (SENOKOT) tablet 17.2 mg  17.2 mg Oral Daily PRN Bobette Mo, MD   17.2 mg at 07/31/22 5409   traZODone (DESYREL) tablet 50 mg  50 mg Oral QHS Bobette Mo, MD   50 mg at 07/30/22 2017    Allergies as of 07/29/2022 -  Review Complete 07/29/2022  Allergen Reaction Noted   Bee venom Anaphylaxis 12/28/2015   Morphine and codeine Other (See Comments) 07/12/2016   Neurontin [gabapentin] Other (See Comments) 10/14/2017   Codeine Hives 02/20/2017   Adhesive [tape] Itching and Other (See Comments) 08/02/2016   Sulfa antibiotics Other (See Comments) 09/02/2014     Review of Systems:    This is positive for those things mentioned in the HPI, also positive for weakness and chronic pain in the lower extremities. All other review of systems are negative.       Physical Exam:  Vital signs in last 24 hours: Temp:  [97.8 F (36.6 C)-98.1 F (36.7 C)] 97.8 F (36.6 C) (07/17 1352) Pulse Rate:  [80-88] 88 (07/17 1352) Resp:  [18-20] 18 (07/17 1352) BP: (138-171)/(75-76) 138/75 (07/17 1352) SpO2:  [94 %-96 %] 94 % (07/17 1352) Last BM Date : 07/30/22  General:  Elderly white woman no acute distress Eyes:  anicteric. WithLungs: Clear to auscultation bilaterally. Heart:   S1S2, no rubs, murmurs, gallops. Abdomen:  soft, obese, mildly tender in the left lower quadrant to deep palpation. Neuro:  A&O x 3.  Psych:  appropriate mood and  Affect.   Data Reviewed:   LAB RESULTS: Recent Labs    07/29/22 1556 07/30/22 1004 07/31/22 0540  WBC 4.5 4.2 4.5  HGB 8.6* 8.1* 8.5*  HCT 27.6* 25.8* 26.7*  PLT 171 145* 135*   BMET Recent Labs    07/29/22 1556 07/30/22 1004 07/31/22 0540  NA 132* 138 132*  K 3.4* 3.8 3.2*  CL 98 106 101  CO2 27 23 23   GLUCOSE 114* 96 91  BUN 10 7*  7*  CREATININE 0.79 0.73 0.50  CALCIUM 8.8* 8.5* 8.5*   LFT Recent Labs    07/30/22 1004  PROT 6.3*  ALBUMIN 3.2*  AST 17  ALT 13  ALKPHOS 63  BILITOT 0.2*   ADDENDUM REPORT: 07/31/2022 14:28   ADDENDUM: Extensive sigmoid colon diverticulosis.     Electronically Signed   By: Ted Mcalpine M.D.   On: 07/31/2022 14:28    Addended by Eugenie Norrie, MD on 07/31/2022  2:30 PM    Study Result Images reviewed Narrative & Impression  CLINICAL DATA:  Left lower quadrant abdominal pain.   EXAM: CT ABDOMEN AND PELVIS WITH CONTRAST   TECHNIQUE: Multidetector CT imaging of the abdomen and pelvis was performed using the standard protocol following bolus administration of intravenous contrast.   RADIATION DOSE REDUCTION: This exam was performed according to the departmental dose-optimization program which includes automated exposure control, adjustment of the mA and/or kV according to patient size and/or use of iterative reconstruction technique.   CONTRAST:  OMNIPAQUE IOHEXOL 300 MG/ML  SOLN   COMPARISON:  July 12, 2022   FINDINGS: Lower chest: Chronic interstitial lung changes. Previously noted minute pulmonary nodules in the right middle lobe are stable.   Hepatobiliary: No focal liver abnormality is seen. Status post cholecystectomy. No biliary dilatation.   Pancreas: Unremarkable. No pancreatic ductal dilatation or surrounding inflammatory changes.   Spleen: Normal in size without focal abnormality.   Adrenals/Urinary Tract: Adrenal glands are unremarkable. Kidneys are normal, without renal calculi, focal lesion, or hydronephrosis. Bladder is unremarkable.   Stomach/Bowel: Stomach is within normal limits. Appendix appears normal. No evidence of small bowel wall thickening, distention, or inflammatory changes. Extensive sigmoid colon diverticulitis. Circumferential thickening of the rectum.   Vascular/Lymphatic: No significant  vascular findings are present. No enlarged abdominal or  pelvic lymph nodes.   Reproductive: Status post hysterectomy. No adnexal masses.   Other: No abdominal wall hernia or abnormality. No abdominopelvic ascites.   Musculoskeletal: Stable sclerotic changes in the spine. Stable compression deformity of T12 vertebral body.   IMPRESSION: 1. Extensive sigmoid colon diverticulitis. 2. Circumferential thickening of the rectum. Please correlate with clinical symptoms of proctitis versus internal hemorrhoids. 3. Stable sclerotic changes in the spine. 4. Stable compression deformity of T12 vertebral body. 5. Chronic interstitial lung changes. 6. Previously noted minute pulmonary nodules in the right middle lobe are stable.   Electronically Signed: By: Ted Mcalpine M.D. On: 07/29/2022 17:05      Thanks   LOS: 2 days   @[place]  Sena Slate, MD, [facility] @  07/31/2022, 5:51 PM

## 2022-07-31 NOTE — Plan of Care (Signed)
  Problem: Education: Goal: Knowledge of General Education information will improve Description Including pain rating scale, medication(s)/side effects and non-pharmacologic comfort measures Outcome: Progressing   

## 2022-07-31 NOTE — Consult Note (Signed)
Consult Note  [facility] 31-Aug-1950  [id number].    Requesting MD: Pamella Pert, MD Chief Complaint/Reason for Consult: Diverticulitis with proctitis  HPI:  Patient is a 72 year old female who presented to the ED Monday with CC of constipation. Patient has multiple medical problems including chronic pain on opioids at home and known history of constipation. Reportedly had not had much bowel function in about 2 weeks. At that time she denied abdominal pain, nausea, vomiting, urinary symptoms. Today she appears confused and does report some generalized abdominal pain. She reports that she has this at home but it always gets better. I spoke with her daughter on the phone who reports that since starting treatment for lung cancer she has seemed to decline cognitively. She is concerned that she may not be remembering to take her bowel regimen but she corroborates that patient has a hx of constipation. Reportedly is not mobilizing a lot at home either. Patient is awaiting MRI brain to work up cognitive decline. She is followed by [facility] and was actually supposed to see Dr. Marina Goodell today per her daughter. She had a colonoscopy last August that showed some rectal ulcerations but no evidence of malignancy. Linzess listed as a home med but unclear how often she actually takes this. Prior abdominal surgery includes cholecystectomy, cesarean section and tubal ligation. She is not on any blood thinners at home.   ROS: Review of Systems  Unable to perform ROS: Mental acuity    Family History  Problem Relation Age of Onset   Hyperlipidemia Mother    Hypertension Mother    Anxiety disorder Mother    Depression Mother    Hyperlipidemia Father    Hypertension Father    Alzheimer's disease Father    Anxiety disorder Brother    Anxiety disorder Brother    Anxiety disorder Brother    Drug abuse Brother    Stroke Brother    Cancer Daughter        unknown   Colon cancer Neg Hx     Esophageal cancer Neg Hx    Rectal cancer Neg Hx    Stomach cancer Neg Hx     Past Medical History:  Diagnosis Date   Anxiety    Aortic atherosclerosis (HCC)    Bilateral cataracts    Carpal tunnel syndrome    Cervical cancer (HCC)    Chronic back pain    Claudication (HCC)    COPD (chronic obstructive pulmonary disease) (HCC)    wheezing   DDD (degenerative disc disease), cervical    DDD (degenerative disc disease), lumbar    Depression    Diverticulosis    Dyspnea    on exertion   Gastroparesis    GERD (gastroesophageal reflux disease)    Hearing loss    Bilateral   History of blood transfusion    History of colon polyps    History of migraine    History of radiation therapy    Left scapula,left chest, right pelvis- 12/20/21-01/02/22- Dr. 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   rt   Urge incontinence of urine    Varicose vein of leg    Venous insufficiency     Past Surgical History:  Procedure Laterality Date   BACK SURGERY     BIOPSY  08/31/2021   Procedure: BIOPSY;  Surgeon: Tressia Danas, MD;  Location: [facility] ENDOSCOPY;  Service: Gastroenterology;;   BLEPHAROPLASTY  10/10/2017   BRONCHIAL BIOPSY  11/21/2021   Procedure: BRONCHIAL BIOPSIES;  Surgeon: Oretha Milch, MD;  Location: [facility] ENDOSCOPY;  Service: Cardiopulmonary;;   BRONCHIAL BRUSHINGS  11/21/2021   Procedure: BRONCHIAL BRUSHINGS;  Surgeon: Oretha Milch, MD;  Location: [facility] ENDOSCOPY;  Service: Cardiopulmonary;;    BRONCHIAL NEEDLE ASPIRATION BIOPSY  11/21/2021   Procedure: BRONCHIAL NEEDLE ASPIRATION BIOPSIES;  Surgeon: Oretha Milch, MD;  Location: [facility] ENDOSCOPY;  Service: Cardiopulmonary;;   BRONCHIAL WASHINGS  11/21/2021   Procedure: BRONCHIAL WASHINGS;  Surgeon: Oretha Milch, MD;  Location: [facility] ENDOSCOPY;  Service: Cardiopulmonary;;   CATARACT EXTRACTION, BILATERAL     CESAREAN SECTION     CHOLECYSTECTOMY     COLONOSCOPY  10/2016   COLONOSCOPY WITH PROPOFOL N/A 08/31/2021   Procedure: COLONOSCOPY WITH PROPOFOL;  Surgeon: Tressia Danas, MD;  Location: [facility] ENDOSCOPY;  Service: Gastroenterology;  Laterality: N/A;   ESOPHAGOGASTRODUODENOSCOPY (EGD) WITH PROPOFOL N/A 08/31/2021   Procedure: ESOPHAGOGASTRODUODENOSCOPY (EGD) WITH PROPOFOL;  Surgeon: Tressia Danas, MD;  Location: [facility] ENDOSCOPY;  Service: Gastroenterology;  Laterality: N/A;   HAND SURGERY Bilateral    carpal tunnel   HEMORRHOID SURGERY     HEMOSTASIS CONTROL  11/21/2021   Procedure: HEMOSTASIS CONTROL;  Surgeon: Oretha Milch, MD;  Location: [facility] ENDOSCOPY;  Service: Cardiopulmonary;;   INTERSTIM IMPLANT PLACEMENT     IR IMAGING GUIDED PORT INSERTION  01/17/2022   KNEE SURGERY Left    arthroscopy x3   LOWER EXTREMITY ANGIOGRAPHY N/A 07/16/2016   Procedure: Lower Extremity Angiography;  Surgeon: Yates Decamp, MD;  Location: [facility] INVASIVE CV LAB;  Service: Cardiovascular;  Laterality: N/A;   LOWER EXTREMITY INTERVENTION N/A 08/06/2016   Procedure: Lower Extremity Intervention;  Surgeon: Yates Decamp, MD;  Location: [facility] INVASIVE CV LAB;  Service: Cardiovascular;  Laterality: N/A;   NECK SURGERY     OTHER SURGICAL HISTORY  2013   bladder stimulator in back   PERIPHERAL VASCULAR BALLOON ANGIOPLASTY  08/06/2016   Procedure: Peripheral Vascular Balloon Angioplasty;  Surgeon: Yates Decamp, MD;  Location: [facility] INVASIVE CV LAB;  Service: Cardiovascular;;  Right SFA   POLYPECTOMY  08/31/2021   Procedure: POLYPECTOMY;  Surgeon: Tressia Danas, MD;  Location:  [facility] ENDOSCOPY;  Service: Gastroenterology;;   TENNIS ELBOW RELEASE/NIRSCHEL PROCEDURE Right 12/2015   TOE SURGERY Right    right foot second and fourth toe   TOTAL HIP ARTHROPLASTY Left    TOTAL HIP REVISION Left 10/13/2017   Procedure: LEFT TOTAL HIP REVISION ACETABULUM, OPEN REDUCTION INTERNAL WITH BONE GRAFT FIXATION LEFT GREATER TROCHANTERIC FRACTURE;  Surgeon: Gean Birchwood, MD;  Location: [facility] ORS;  Service: Orthopedics;  Laterality: Left;   TOTAL KNEE ARTHROPLASTY Right 01/12/2016   Procedure: RIGHT TOTAL KNEE ARTHROPLASTY;  Surgeon: Beverely Low, MD;  Location: [facility] OR;  Service: Orthopedics;  Laterality: Right;   TUBAL LIGATION     UPPER GI  ENDOSCOPY  10/2016   VAGINAL HYSTERECTOMY     VIDEO BRONCHOSCOPY WITH ENDOBRONCHIAL ULTRASOUND N/A 11/21/2021   Procedure: VIDEO BRONCHOSCOPY WITH ENDOBRONCHIAL ULTRASOUND;  Surgeon: Oretha Milch, MD;  Location: [facility] ENDOSCOPY;  Service: Cardiopulmonary;  Laterality: N/A;    Social History:  reports that she has been smoking cigarettes. She has a 27.5 pack-year smoking history. She has been exposed to tobacco smoke. She has never used smokeless tobacco. She reports that she does not drink alcohol and does not use drugs.  Allergies:  Allergies  Allergen Reactions   Bee Venom Anaphylaxis   Morphine And Codeine Other (See Comments)    Overly sedated   Neurontin [Gabapentin] Other (See Comments)    Nightmares    Codeine Hives   Adhesive [Tape] Itching and Other (See Comments)    Redness, Paper tape is ok   Sulfa Antibiotics Other (See Comments)    Childhood allergy     Medications Prior to Admission  Medication Sig Dispense Refill   acetaminophen (TYLENOL) 325 MG tablet Take 650 mg by mouth 2 (two) times daily as needed for headache or mild pain.     albuterol (PROVENTIL) (2.5 MG/3ML) 0.083% nebulizer solution Inhale 3 mLs into the lungs every 4 (four) hours as needed for wheezing or shortness of breath (cough). (Patient taking differently:  Inhale 3 mLs into the lungs 2 (two) times daily as needed for wheezing or shortness of breath (cough).) 75 mL 12   albuterol (VENTOLIN HFA) 108 (90 Base) MCG/ACT inhaler Inhale 2 puffs into the lungs 2 (two) times daily as needed for wheezing or shortness of breath.     aspirin EC (ASPIRIN 81) 81 MG tablet Take 1 tablet (81 mg total) by mouth daily. 90 tablet 3   DULoxetine (CYMBALTA) 60 MG capsule Take 2 capsules (120 mg total) by mouth daily. (Patient taking differently: Take 60 mg by mouth in the morning and at bedtime.) 60 capsule 3   hydrocortisone (ANUSOL-HC) 25 MG suppository Place 1 suppository (25 mg total) rectally 2 (two) times daily as needed for hemorrhoids or anal itching. 12 suppository 0   lidocaine-prilocaine (EMLA) cream Apply 30 minutes before appointment 30 g 2   linaclotide (LINZESS) 290 MCG CAPS capsule Take 1 capsule (290 mcg total) by mouth daily before breakfast. (Patient taking differently: Take 290 mcg by mouth daily as needed (for constipation).) 30 capsule 6   LORazepam (ATIVAN) 0.5 MG tablet Take 1 tablet 30 minutes before your MRI (Patient taking differently: Take 0.5 mg by mouth See admin instructions. Take 0.5 mg by mouth 30 minutes before MRI) 2 tablet 0   methocarbamol (ROBAXIN) 500 MG tablet Take 1 tablet (500 mg total) by mouth every 6 hours as needed. (Patient taking differently: Take 500 mg by mouth every 6 (six) hours as needed for muscle spasms.) 120 tablet 1   metoprolol succinate (TOPROL-XL) 25 MG 24 hr tablet TAKE 1 TABLET BY MOUTH DAILY. TAKE WITH OR IMMEDIATELY FOLLOWING A MEAL. 90 tablet 3   nitroGLYCERIN (NITROSTAT) 0.4 MG SL tablet Place 1 tablet (0.4 mg total) under the tongue every 5 (five) minutes x 3 doses as needed for chest pain. 25 tablet 1   ondansetron (ZOFRAN) 8 MG tablet Take 1 tablet (8 mg total) by mouth every 8 (eight) hours as needed for nausea or vomiting. 30 tablet 2   Oxycodone HCl 10 MG TABS Take 1 tablet (10 mg total) by mouth every 4  hours as needed for pain 180 tablet 0  pantoprazole (PROTONIX) 40 MG tablet Take 1 tablet (40 mg total) by mouth daily as needed (acid reflux). 30 tablet 2   prochlorperazine (COMPAZINE) 10 MG tablet Take 1 tablet (10 mg total) by mouth every 6 (six) hours as needed for nausea or vomiting. 30 tablet 2   rOPINIRole (REQUIP) 1 MG tablet Take 1 tablet (1 mg total) by mouth 3 (three) times daily as directed (Patient taking differently: Take 1-2 mg by mouth See admin instructions. Take 1 mg by mouth in the morning and 2 mg at bedtime) 270 tablet 1   rosuvastatin (CRESTOR) 10 MG tablet Take 10 mg by mouth at bedtime.     senna (SENOKOT) 8.6 MG tablet Take 17.2 mg by mouth daily as needed for constipation.     Tiotropium Bromide-Olodaterol (STIOLTO RESPIMAT) 2.5-2.5 MCG/ACT AERS Inhale 2 puffs into the lungs daily. (Patient taking differently: Inhale 2 puffs into the lungs daily as needed (for respiratory flares).) 1 each 5   traZODone (DESYREL) 50 MG tablet TAKE 0.5-1 TABLETS (25-50 MG TOTAL) BY MOUTH AT BEDTIME AS NEEDED FOR SLEEP. (Patient taking differently: Take 50 mg by mouth at bedtime.) 90 tablet 1   busPIRone (BUSPAR) 5 MG tablet Take 1 tablet (5 mg total) by mouth 2 (two) times daily. (Patient not taking: Reported on 07/29/2022) 60 tablet 0   cephALEXin (KEFLEX) 500 MG capsule Take 1 capsule (500 mg total) by mouth 4 (four) times daily. 20 capsule 0   DULoxetine (CYMBALTA) 60 MG capsule Take 2 capsules (120 mg total) by mouth daily. (Patient not taking: Reported on 07/29/2022) 60 capsule 3   fluconazole (DIFLUCAN) 100 MG tablet Take 1 tablet (100 mg total) by mouth daily. 10 tablet 0   losartan (COZAAR) 25 MG tablet Take 25 mg by mouth daily.     metoCLOPramide (REGLAN) 5 MG tablet Take 1 tablet by mouth 3 times daily 20-30 minutes before meals NO FURTHER REFILLS UNTIL SEEN, NEEDS AND APPOINTMENT (Patient not taking: Reported on 07/29/2022) 90 tablet 0   mupirocin ointment (BACTROBAN) 2 % Apply 1  Application topically 2 (two) times daily. 20 g 0   Oxycodone HCl 10 MG TABS Take 10 mg by mouth 5 (five) times daily as needed (for moderate pain). (Patient not taking: Reported on 07/29/2022)     potassium chloride SA (KLOR-CON M) 20 MEQ tablet Take 1 tablet (20 mEq total) by mouth 2 (two) times daily. (Patient not taking: Reported on 07/29/2022) 14 tablet 0   potassium chloride SA (KLOR-CON M) 20 MEQ tablet Take 1 tablet (20 mEq total) by mouth 2 (two) times daily. (Patient not taking: Reported on 07/29/2022) 14 tablet 0    Blood pressure (!) 171/76, pulse 84, temperature 97.8 F (36.6 C), temperature source Oral, resp. rate 20, height 5' (1.524 m), weight 73.9 kg, SpO2 96%. Physical Exam:  General: pleasant, WD, elderly female who is laying in bed in NAD HEENT: EOMI, sclera anicteric  Heart: regular, rate, and rhythm.  Palpable radial and pedal pulses bilaterally Lungs:  Respiratory effort nonlabored Abd: soft, generalized ttp without peritonitis, ND MS: all 4 extremities are symmetrical with no cyanosis, clubbing, or edema. Skin: warm and dry with no masses, lesions, or rashes Psych: confused    Results for orders placed or performed during the hospital encounter of 07/29/22 (from the past 48 hour(s))  Comprehensive metabolic panel     Status: Abnormal   Collection Time: 07/29/22  3:56 PM  Result Value Ref Range   Sodium 132 (L)  135 - 145 mmol/L   Potassium 3.4 (L) 3.5 - 5.1 mmol/L   Chloride 98 98 - 111 mmol/L   CO2 27 22 - 32 mmol/L   Glucose, Bld 114 (H) 70 - 99 mg/dL    Comment: Glucose reference range applies only to samples taken after fasting for at least 8 hours.   BUN 10 8 - 23 mg/dL   Creatinine, Ser 3.08 0.44 - 1.00 mg/dL   Calcium 8.8 (L) 8.9 - 10.3 mg/dL   Total Protein 6.9 6.5 - 8.1 g/dL   Albumin 3.4 (L) 3.5 - 5.0 g/dL   AST 18 15 - 41 U/L   ALT 15 0 - 44 U/L   Alkaline Phosphatase 69 38 - 126 U/L   Total Bilirubin 0.5 0.3 - 1.2 mg/dL   GFR, Estimated >65 >78  mL/min    Comment: (NOTE) Calculated using the CKD-EPI Creatinine Equation (2021)    Anion gap 7 5 - 15    Comment: Performed at [facility], [address] [place], [place] [address]  Lipase, blood     Status: None   Collection Time: 07/29/22  3:56 PM  Result Value Ref Range   Lipase 25 11 - 51 U/L    Comment: Performed at [facility], [address] [place], [place] [address]  CBC with Differential     Status: Abnormal   Collection Time: 07/29/22  3:56 PM  Result Value Ref Range   WBC 4.5 4.0 - 10.5 K/uL   RBC 2.91 (L) 3.87 - 5.11 MIL/uL   Hemoglobin 8.6 (L) 12.0 - 15.0 g/dL   HCT 13.2 (L) 44.0 - 10.2 %   MCV 94.8 80.0 - 100.0 fL   MCH 29.6 26.0 - 34.0 pg   MCHC 31.2 30.0 - 36.0 g/dL   RDW 72.5 (H) 36.6 - 44.0 %   Platelets 171 150 - 400 K/uL   nRBC 0.0 0.0 - 0.2 %   Neutrophils Relative % 79 %   Neutro Abs 3.5 1.7 - 7.7 K/uL   Lymphocytes Relative 13 %   Lymphs Abs 0.6 (L) 0.7 - 4.0 K/uL   Monocytes Relative 6 %   Monocytes Absolute 0.3 0.1 - 1.0 K/uL   Eosinophils Relative 2 %   Eosinophils Absolute 0.1 0.0 - 0.5 K/uL   Basophils Relative 0 %   Basophils Absolute 0.0 0.0 - 0.1 K/uL   Immature Granulocytes 0 %   Abs Immature Granulocytes 0.02 0.00 - 0.07 K/uL    Comment: Performed at [facility], [address] [place], [place] [address]  Magnesium     Status: None   Collection Time: 07/29/22  3:56 PM  Result Value Ref Range   Magnesium 1.7 1.7 - 2.4 mg/dL    Comment: Performed at [facility], [address], [place], [place] [address]  Urinalysis, w/ Reflex to Culture (Infection Suspected) -Urine, Clean Catch     Status: Abnormal   Collection Time: 07/29/22  6:14 PM  Result Value Ref Range   Specimen Source URINE, CLEAN CATCH    Color, Urine STRAW (A) YELLOW   APPearance CLEAR CLEAR   Specific Gravity, Urine 1.016 1.005 - 1.030   pH 6.0 5.0 - 8.0   Glucose, UA NEGATIVE NEGATIVE  mg/dL   Hgb urine dipstick MODERATE (A) NEGATIVE   Bilirubin Urine NEGATIVE NEGATIVE   Ketones, ur NEGATIVE NEGATIVE mg/dL   Protein, ur NEGATIVE NEGATIVE mg/dL   Nitrite NEGATIVE NEGATIVE  Leukocytes,Ua NEGATIVE NEGATIVE   RBC / HPF 0-5 0 - 5 RBC/hpf   WBC, UA 0-5 0 - 5 WBC/hpf    Comment:        Reflex urine culture not performed if WBC <=10, OR if Squamous epithelial cells >5. If Squamous epithelial cells >5 suggest recollection.    Bacteria, UA NONE SEEN NONE SEEN   Squamous Epithelial / HPF 0-5 0 - 5 /HPF    Comment: Performed at [facility], [address], [place], [place] [address]  CBC     Status: Abnormal   Collection Time: 07/30/22 10:04 AM  Result Value Ref Range   WBC 4.2 4.0 - 10.5 K/uL   RBC 2.67 (L) 3.87 - 5.11 MIL/uL   Hemoglobin 8.1 (L) 12.0 - 15.0 g/dL   HCT 95.2 (L) 84.1 - 32.4 %   MCV 96.6 80.0 - 100.0 fL   MCH 30.3 26.0 - 34.0 pg   MCHC 31.4 30.0 - 36.0 g/dL   RDW 40.1 (H) 02.7 - 25.3 %   Platelets 145 (L) 150 - 400 K/uL   nRBC 0.0 0.0 - 0.2 %    Comment: Performed at [facility], [address] [place], [place] [address]  Comprehensive metabolic panel     Status: Abnormal   Collection Time: 07/30/22 10:04 AM  Result Value Ref Range   Sodium 138 135 - 145 mmol/L   Potassium 3.8 3.5 - 5.1 mmol/L   Chloride 106 98 - 111 mmol/L   CO2 23 22 - 32 mmol/L   Glucose, Bld 96 70 - 99 mg/dL    Comment: Glucose reference range applies only to samples taken after fasting for at least 8 hours.   BUN 7 (L) 8 - 23 mg/dL   Creatinine, Ser 3.47 0.44 - 1.00 mg/dL   Calcium 8.5 (L) 8.9 - 10.3 mg/dL   Total Protein 6.3 (L) 6.5 - 8.1 g/dL   Albumin 3.2 (L) 3.5 - 5.0 g/dL   AST 17 15 - 41 U/L   ALT 13 0 - 44 U/L   Alkaline Phosphatase 63 38 - 126 U/L   Total Bilirubin 0.2 (L) 0.3 - 1.2 mg/dL   GFR, Estimated >42 >59 mL/min    Comment: (NOTE) Calculated using the CKD-EPI Creatinine Equation (2021)    Anion gap 9 5 - 15     Comment: Performed at [facility], [address], [place], [place] [address]  CBC with Differential/Platelet     Status: Abnormal   Collection Time: 07/31/22  5:40 AM  Result Value Ref Range   WBC 4.5 4.0 - 10.5 K/uL   RBC 2.84 (L) 3.87 - 5.11 MIL/uL   Hemoglobin 8.5 (L) 12.0 - 15.0 g/dL   HCT 56.4 (L) 33.2 - 95.1 %   MCV 94.0 80.0 - 100.0 fL   MCH 29.9 26.0 - 34.0 pg   MCHC 31.8 30.0 - 36.0 g/dL   RDW 88.4 (H) 16.6 - 06.3 %   Platelets 135 (L) 150 - 400 K/uL   nRBC 0.0 0.0 - 0.2 %   Neutrophils Relative % 72 %   Neutro Abs 3.2 1.7 - 7.7 K/uL   Lymphocytes Relative 14 %   Lymphs Abs 0.6 (L) 0.7 - 4.0 K/uL   Monocytes Relative 7 %   Monocytes Absolute 0.3 0.1 - 1.0 K/uL   Eosinophils Relative 7 %   Eosinophils Absolute 0.3 0.0 - 0.5 K/uL   Basophils Relative 0 %  Basophils Absolute 0.0 0.0 - 0.1 K/uL   Immature Granulocytes 0 %   Abs Immature Granulocytes 0.01 0.00 - 0.07 K/uL    Comment: Performed at [facility], [address], [place], [place] [address]  Basic metabolic panel     Status: Abnormal   Collection Time: 07/31/22  5:40 AM  Result Value Ref Range   Sodium 132 (L) 135 - 145 mmol/L   Potassium 3.2 (L) 3.5 - 5.1 mmol/L   Chloride 101 98 - 111 mmol/L   CO2 23 22 - 32 mmol/L   Glucose, Bld 91 70 - 99 mg/dL    Comment: Glucose reference range applies only to samples taken after fasting for at least 8 hours.   BUN 7 (L) 8 - 23 mg/dL   Creatinine, Ser 0.27 0.44 - 1.00 mg/dL   Calcium 8.5 (L) 8.9 - 10.3 mg/dL   GFR, Estimated >25 >36 mL/min    Comment: (NOTE) Calculated using the CKD-EPI Creatinine Equation (2021)    Anion gap 8 5 - 15    Comment: Performed at [facility], [address] [place], [place] [address]  Reticulocytes     Status: Abnormal   Collection Time: 07/31/22  5:40 AM  Result Value Ref Range   Retic Ct Pct 3.3 (H) 0.4 - 3.1 %   RBC. 2.82 (L) 3.87 - 5.11 MIL/uL   Retic Count, Absolute  93.3 19.0 - 186.0 K/uL   Immature Retic Fract 24.5 (H) 2.3 - 15.9 %    Comment: Performed at [facility], [address] [place], [place] [address]  Vitamin B12     Status: None   Collection Time: 07/31/22  6:25 AM  Result Value Ref Range   Vitamin B-12 394 180 - 914 pg/mL    Comment: (NOTE) This assay is not validated for testing neonatal or myeloproliferative syndrome specimens for Vitamin B12 levels. Performed at [facility], [address], [place], [place] [address]   Folate     Status: None   Collection Time: 07/31/22  6:25 AM  Result Value Ref Range   Folate 13.2 >5.9 ng/mL    Comment: Performed at [facility], [address] [place], [place] [address]  Iron and TIBC     Status: Abnormal   Collection Time: 07/31/22  6:25 AM  Result Value Ref Range   Iron 41 28 - 170 ug/dL   TIBC 329 518 - 841 ug/dL   Saturation Ratios 10 (L) 10.4 - 31.8 %   UIBC 357 ug/dL    Comment: Performed at [facility], [address], [place], [place] [address]  Ferritin     Status: None   Collection Time: 07/31/22  6:25 AM  Result Value Ref Range   Ferritin 23 11 - 307 ng/mL    Comment: Performed at [facility], [address], [place], [place] [address]  T4, free     Status: None   Collection Time: 07/31/22  6:25 AM  Result Value Ref Range   Free T4 0.98 0.61 - 1.12 ng/dL    Comment: (NOTE) Biotin ingestion may interfere with free T4 tests. If the results are inconsistent with the TSH level, previous test results, or the clinical presentation, then consider biotin interference. If needed, order repeat testing after stopping biotin. Performed at [facility], [address] [place], [place] [address]    CT ABDOMEN PELVIS W CONTRAST  Result Date:  07/29/2022 CLINICAL DATA:  Left lower quadrant abdominal pain. EXAM: CT ABDOMEN AND PELVIS WITH CONTRAST TECHNIQUE: Multidetector CT  imaging of the abdomen and pelvis was performed using the standard protocol following bolus administration of intravenous contrast. RADIATION DOSE REDUCTION: This exam was performed according to the departmental dose-optimization program which includes automated exposure control, adjustment of the mA and/or kV according to patient size and/or use of iterative reconstruction technique. CONTRAST:  OMNIPAQUE IOHEXOL 300 MG/ML  SOLN COMPARISON:  July 12, 2022 FINDINGS: Lower chest: Chronic interstitial lung changes. Previously noted minute pulmonary nodules in the right middle lobe are stable. Hepatobiliary: No focal liver abnormality is seen. Status post cholecystectomy. No biliary dilatation. Pancreas: Unremarkable. No pancreatic ductal dilatation or surrounding inflammatory changes. Spleen: Normal in size without focal abnormality. Adrenals/Urinary Tract: Adrenal glands are unremarkable. Kidneys are normal, without renal calculi, focal lesion, or hydronephrosis. Bladder is unremarkable. Stomach/Bowel: Stomach is within normal limits. Appendix appears normal. No evidence of small bowel wall thickening, distention, or inflammatory changes. Extensive sigmoid colon diverticulitis. Circumferential thickening of the rectum. Vascular/Lymphatic: No significant vascular findings are present. No enlarged abdominal or pelvic lymph nodes. Reproductive: Status post hysterectomy. No adnexal masses. Other: No abdominal wall hernia or abnormality. No abdominopelvic ascites. Musculoskeletal: Stable sclerotic changes in the spine. Stable compression deformity of T12 vertebral body. IMPRESSION: 1. Extensive sigmoid colon diverticulitis. 2. Circumferential thickening of the rectum. Please correlate with clinical symptoms of proctitis versus internal hemorrhoids. 3. Stable sclerotic changes in the spine. 4. Stable compression deformity of T12 vertebral body. 5. Chronic interstitial lung changes. 6. Previously noted minute pulmonary  nodules in the right middle lobe are stable. Electronically Signed   By: Ted Mcalpine M.D.   On: 07/29/2022 17:05      Assessment/Plan Diverticulitis  Constipation with proctitis  - CT 7/15 with extensive sigmoid diverticulitis and circumferential rectal thickening  - patient with generalized ttp on abdominal exam but abdomen is soft - unclear how much bowel function she is having but hx of significant constipation  - colonoscopy 08/2021 with rectal ulcerations, likely stercoral - no evidence of malignancy on biopsies  - recommend restarting home linzess and aggressive bowel regimen - may even consider slow bowel prep - no leukocytosis or fever, HD stable  - followed by [facility] - was scheduled to see Dr. Marina Goodell today, recommend reaching out and letting them know she is admitted - no plans for emergent surgical intervention, may need repeat CT in a few days if not improving or if acutely worsens. If she were to require surgery for this she would likely require colostomy. I discussed this with her daughter and she was planning to discuss with patient and her father to establish if that is something patient would be willing to consider  FEN: FLD, bowel regimen  VTE: ok to have SQH or LMWH from surgery standpoint ID: [email or web address]  I reviewed hospitalist notes, last 24 h vitals and pain scores, last 48 h intake and output, last 24 h labs and trends, and last 24 h imaging results.  Juliet Rude, [facility] Surgery 07/31/2022, 1:04 PM Please see Amion for pager number during day hours 7:00am-4:30pm

## 2022-07-31 NOTE — Progress Notes (Signed)
PROGRESS NOTE  [name] VVO:[id number] DOB: [birth year] DOA: 07/29/2022 PCP: [name], NP   LOS: 2 days   Brief Narrative / Interim history: 72 y.o. female with medical history significant of anxiety, depression, aortic atherosclerosis, bilateral cataracts carpal tunnel syndrome cervical cancer chronic back pain, claudication, COPD cervical DDD, lumbar DDD, blood transfusion, migraine headaches, hypertension, iron deficiency anemia, pulm nodule osteoarthritis, history of pneumonia, prediabetes, Raynaud's phenomenon, sleep apnea not on CPAP, spondylolysis, history of nonhemorrhagic CVA, tennis elbow syndrome, urinary urgency, varicose veins, colon polyps, GERD, gastroparesis, constipation, diverticulosis who is coming to the emergency department with complaints of abdominal pain and constipation for the past 2 weeks. CT Abdomen shows extensive diverticulitis of the sigmoid colon. Circumferential thickening of the rectum.   Subjective / 24h Interval events: Feels slightly better. Asking about going home.  Assesement and Plan: Principal Problem:   Acute diverticulitis Active Problems:   Essential hypertension   GERD   Gastroparesis   Diverticulosis of colon   Constipation   Tobacco abuse   OSA (obstructive sleep apnea)   COPD (chronic obstructive pulmonary disease) (HCC)   Normocytic anemia   Hypokalemia   Principal problem Acute diverticulitis of the sigmoid colon -continue IV antibiotics, liquid diet.  Surgery consulted, appreciate input.  Remains quite tender on abdominal exam   Active problems Hypokalemia - Replaced.    Hyperlipidemia - resume Crestor.    Restless leg syndrome - Resume Requip.    Chronic pain - Resume home pain meds.    Hyponatremia - Na stable   Hypertension -continue metoprolol, resume losartan today   H/o anxiety and depression - continue home meds.    Anemia of chronic disease - Hemoglobin stable around 8.    Mild thrombocytopenia -  Monitor.   Scheduled Meds:  aspirin EC  81 mg Oral Daily   DULoxetine  60 mg Oral BID   feeding supplement  237 mL Oral BID BM   metoCLOPramide (REGLAN) injection  5 mg Intravenous Q6H   metoprolol succinate  25 mg Oral Daily   rOPINIRole  1 mg Oral Daily   rOPINIRole  2 mg Oral QHS   rosuvastatin  10 mg Oral QHS   traZODone  50 mg Oral QHS   Continuous Infusions:  cefTRIAXone (ROCEPHIN)  IV 2 g (07/30/22 1628)   metronidazole 500 mg (07/31/22 0534)   PRN Meds:.acetaminophen **OR** acetaminophen, albuterol, HYDROmorphone (DILAUDID) injection, nitroGLYCERIN, ondansetron **OR** ondansetron (ZOFRAN) IV, oxyCODONE, pantoprazole, senna  Current Outpatient Medications  Medication Instructions   acetaminophen (TYLENOL) 650 mg, Oral, 2 times daily PRN   albuterol (PROVENTIL) (2.5 MG/3ML) 0.083% nebulizer solution 3 mLs, Inhalation, Every 4 hours PRN   albuterol (VENTOLIN HFA) 108 (90 Base) MCG/ACT inhaler 2 puffs, Inhalation, 2 times daily PRN   aspirin EC (ASPIRIN 81) 81 mg, Oral, Daily   busPIRone (BUSPAR) 5 mg, Oral, 2 times daily   cephALEXin (KEFLEX) 500 mg, Oral, 4 times daily   DULoxetine (CYMBALTA) 120 mg, Oral, Daily   DULoxetine (CYMBALTA) 120 mg, Oral, Daily   fluconazole (DIFLUCAN) 100 mg, Oral, Daily   hydrocortisone (ANUSOL-HC) 25 mg, Rectal, 2 times daily PRN   lidocaine-prilocaine (EMLA) cream Apply 30 minutes before appointment   linaclotide (LINZESS) 290 mcg, Oral, Daily before breakfast   LORazepam (ATIVAN) 0.5 MG tablet Take 1 tablet 30 minutes before your MRI   losartan (COZAAR) 25 mg, Oral, Daily   methocarbamol (ROBAXIN) 500 MG tablet Take 1 tablet (500 mg total) by mouth every 6 hours as  needed.   metoCLOPramide (REGLAN) 5 MG tablet Take 1 tablet by mouth 3 times daily 20-30 minutes before meals NO FURTHER REFILLS UNTIL SEEN, NEEDS AND APPOINTMENT   metoprolol succinate (TOPROL-XL) 25 mg, Oral, Daily, TAKE WITH OR IMMEDIATELY FOLLOWING A MEAL.   mupirocin  ointment (BACTROBAN) 2 % 1 Application, Topical, 2 times daily   nitroGLYCERIN (NITROSTAT) 0.4 mg, Sublingual, Every 5 min x3 PRN   ondansetron (ZOFRAN) 8 mg, Oral, Every 8 hours PRN   Oxycodone HCl 10 MG TABS Take 1 tablet (10 mg total) by mouth every 4 hours as needed for pain   Oxycodone HCl 10 mg, 5 times daily PRN   pantoprazole (PROTONIX) 40 mg, Oral, Daily PRN   potassium chloride SA (KLOR-CON M) 20 MEQ tablet 20 mEq, Oral, 2 times daily   potassium chloride SA (KLOR-CON M) 20 MEQ tablet 20 mEq, Oral, 2 times daily   prochlorperazine (COMPAZINE) 10 mg, Oral, Every 6 hours PRN   rOPINIRole (REQUIP) 1 MG tablet Take 1 tablet (1 mg total) by mouth 3 (three) times daily as directed   rosuvastatin (CRESTOR) 10 mg, Oral, Daily at bedtime   senna (SENOKOT) 17.2 mg, Oral, Daily PRN   Tiotropium Bromide-Olodaterol (STIOLTO RESPIMAT) 2.5-2.5 MCG/ACT AERS 2 puffs, Inhalation, Daily   traZODone (DESYREL) 25-50 mg, Oral, At bedtime PRN    Diet Orders (From admission, onward)     Start     Ordered   07/29/22 1822  Diet full liquid Fluid consistency: Thin  Diet effective now       Question:  Fluid consistency:  Answer:  Thin   07/29/22 1821            DVT prophylaxis: SCDs Start: 07/29/22 1851   Lab Results  Component Value Date   PLT 135 (L) 07/31/2022      Code Status: Full Code  Family Communication: no family at bedside   Status is: Inpatient Remains inpatient appropriate because: severity of illness  Level of care: Med-Surg  Consultants:  General surgery   Objective: Vitals:   07/30/22 0730 07/30/22 1504 07/30/22 2146 07/31/22 0445  BP: (!) 153/71 132/67 (!) 165/75 (!) 171/76  Pulse: 86 82 80 84  Resp: 18 18 18 20   Temp: 98.5 F (36.9 C) 98.2 F (36.8 C) 98.1 F (36.7 C) 97.8 F (36.6 C)  TempSrc: Oral Oral Oral Oral  SpO2: 96% 97% 95% 96%  Weight:      Height:        Intake/Output Summary (Last 24 hours) at 07/31/2022 1139 Last data filed at 07/30/2022  1504 Gross per 24 hour  Intake 20 ml  Output --  Net 20 ml   Wt Readings from Last 3 Encounters:  07/29/22 73.9 kg  07/23/22 72.9 kg  07/12/22 70.3 kg    Examination:  Constitutional: NAD Eyes: no scleral icterus ENMT: Mucous membranes are moist.  Neck: normal, supple Respiratory: clear to auscultation bilaterally, no wheezing, no crackles. Normal respiratory effort. No accessory muscle use.  Cardiovascular: Regular rate and rhythm, no murmurs / rubs / gallops. No LE edema.  Abdomen: Tender throughout but mostly in the left lower quadrant, no guarding Musculoskeletal: no clubbing / cyanosis.   Data Reviewed: I have independently reviewed following labs and imaging studies   CBC Recent Labs  Lab 07/29/22 1556 07/30/22 1004 07/31/22 0540  WBC 4.5 4.2 4.5  HGB 8.6* 8.1* 8.5*  HCT 27.6* 25.8* 26.7*  PLT 171 145* 135*  MCV 94.8 96.6 94.0  MCH  29.6 30.3 29.9  MCHC 31.2 31.4 31.8  RDW 15.9* 15.9* 15.7*  LYMPHSABS 0.6*  --  0.6*  MONOABS 0.3  --  0.3  EOSABS 0.1  --  0.3  BASOSABS 0.0  --  0.0    Recent Labs  Lab 07/29/22 1556 07/30/22 1004 07/31/22 0540  NA 132* 138 132*  K 3.4* 3.8 3.2*  CL 98 106 101  CO2 27 23 23   GLUCOSE 114* 96 91  BUN 10 7* 7*  CREATININE 0.79 0.73 0.50  CALCIUM 8.8* 8.5* 8.5*  AST 18 17  --   ALT 15 13  --   ALKPHOS 69 63  --   BILITOT 0.5 0.2*  --   ALBUMIN 3.4* 3.2*  --   MG 1.7  --   --     ------------------------------------------------------------------------------------------------------------------ No results for input(s): "CHOL", "HDL", "LDLCALC", "TRIG", "CHOLHDL", "LDLDIRECT" in the last 72 hours.  Lab Results  Component Value Date   HGBA1C 6.1 (H) 01/02/2016   ------------------------------------------------------------------------------------------------------------------ No results for input(s): "TSH", "T4TOTAL", "T3FREE", "THYROIDAB" in the last 72 hours.  Invalid input(s): "FREET3"  Cardiac Enzymes No  results for input(s): "CKMB", "TROPONINI", "MYOGLOBIN" in the last 168 hours.  Invalid input(s): "CK" ------------------------------------------------------------------------------------------------------------------    Component Value Date/Time   BNP 31.1 11/10/2014 1212    CBG: No results for input(s): "GLUCAP" in the last 168 hours.  No results found for this or any previous visit (from the past 240 hour(s)).   Radiology Studies: No results found.   Pamella Pert, MD, PhD [facility]  Between 7 am - 7 pm I am available, please contact me via Amion (for emergencies) or Securechat (non urgent messages)  Between 7 pm - 7 am I am not available, please contact night coverage MD/APP via Amion

## 2022-08-01 DIAGNOSIS — K5289 Other specified noninfective gastroenteritis and colitis: Secondary | ICD-10-CM

## 2022-08-01 LAB — T3, FREE: T3, Free: 2.2 pg/mL (ref 2.0–4.4)

## 2022-08-01 MED ORDER — POLYETHYLENE GLYCOL 3350 17 G PO PACK: 17.0000 g | PACK | Freq: Two times a day (BID) | ORAL | 0 refills | Status: DC

## 2022-08-01 MED ORDER — LINACLOTIDE 290 MCG PO CAPS: 290.0000 ug | ORAL_CAPSULE | Freq: Every day | ORAL | 0 refills | Status: AC

## 2022-08-01 MED ORDER — SENNOSIDES 8.6 MG PO TABS: 1.0000 | ORAL_TABLET | Freq: Every day | ORAL | 0 refills | Status: DC

## 2022-08-01 NOTE — Progress Notes (Signed)
Physical Therapy Treatment Patient Details Name: [name] MRN: [id number] DOB: August 16, 1950 Today's Date: 08/01/2022   History of Present Illness 72 y.o. female with medical history significant of anxiety, depression, aortic atherosclerosis, bilateral cataracts carpal tunnel syndrome cervical cancer chronic back pain, claudication, COPD cervical DDD, lumbar DDD, blood transfusion, migraine headaches, hypertension, iron deficiency anemia, pulm nodule osteoarthritis, history of pneumonia, prediabetes, Raynaud's phenomenon, sleep apnea not on CPAP, spondylolysis, history of nonhemorrhagic CVA, tennis elbow syndrome, urinary urgency, varicose veins, colon polyps, GERD, gastroparesis, constipation, diverticulosis who is coming to the emergency department 07/29/22 with complaints of abdominal pain and constipation . CT Abdomen shows extensive diverticulitis of the sigmoid colon, Circumferential thickening of the rectum.    PT Comments  The patient is alert, oriented x 3. Patient not aware of BM incontinence. Patient ambulated  into BR using Rw, no balance loss. Patient reports spouse is home and  recently DC'd from Rehab. Patient may require min guard  for mobility and safety. Patient reports other family availble. No family available to confirm.    Assistance Recommended at Discharge Intermittent Supervision/Assistance  If plan is discharge home, recommend the following:  Can travel by private vehicle    A little help with walking and/or transfers;A little help with bathing/dressing/bathroom;Assistance with cooking/housework;Direct supervision/assist for financial management;Assist for transportation;Help with stairs or ramp for entrance      Equipment Recommendations  None recommended by PT    Recommendations for Other Services       Precautions / Restrictions Precautions Precautions: Fall Precaution Comments: incontinent BM Restrictions Weight Bearing Restrictions: No      Mobility  Bed Mobility   Bed Mobility: Rolling, Sidelying to Sit   Sidelying to sit: Mod assist       General bed mobility comments: min assist to sit upright    Transfers   Equipment used: Rolling walker (2 wheels) Transfers: Sit to/from Stand Sit to Stand: Min guard           General transfer comment: cues for safety,  able to stand from toilet with use of rail.    Ambulation/Gait Ambulation/Gait assistance: Min assist Gait Distance (Feet):  (x 2) Assistive device: Rolling walker (2 wheels) Gait Pattern/deviations: Step-through pattern, WFL(Within Functional Limits) Gait velocity: decr     General Gait Details: managed to ambulate into BR   Stairs             Wheelchair Mobility     Tilt Bed    Modified Rankin (Stroke Patients Only)       Balance Overall balance assessment: History of Falls, Needs assistance Sitting-balance support: No upper extremity supported, Feet supported Sitting balance-Leahy Scale: Good     Standing balance support: Reliant on assistive device for balance, During functional activity, No upper extremity supported   Standing balance comment: performing hygiene in standing, stands and washes hands, no balance loss                            Cognition Arousal/Alertness: Awake/alert Behavior During Therapy: WFL for tasks assessed/performed Overall Cognitive Status: Within Functional Limits for tasks assessed                                 General Comments: patient followed directions well, O x 3,        Exercises      General Comments  Pertinent Vitals/Pain Pain Assessment Pain Assessment: Faces Faces Pain Scale: Hurts even more Pain Location: neuropathy, rewctum Pain Descriptors / Indicators: Guarding, Grimacing Pain Intervention(s): Monitored during session    Home Living                          Prior Function            PT Goals (current goals can  now be found in the care plan section) Progress towards PT goals: Progressing toward goals    Frequency           PT Plan Current plan remains appropriate    Co-evaluation              AM-PAC PT "6 Clicks" Mobility   Outcome Measure  Help needed turning from your back to your side while in a flat bed without using bedrails?: None Help needed moving from lying on your back to sitting on the side of a flat bed without using bedrails?: A Little Help needed moving to and from a bed to a chair (including a wheelchair)?: A Little Help needed standing up from a chair using your arms (e.g., wheelchair or bedside chair)?: A Little Help needed to walk in hospital room?: A Lot Help needed climbing 3-5 steps with a railing? : A Lot 6 Click Score: 17    End of Session Equipment Utilized During Treatment: Gait belt Activity Tolerance: Patient tolerated treatment well Patient left: in chair;with call bell/phone within reach;with chair alarm set;with nursing/sitter in room Nurse Communication: Mobility status PT Visit Diagnosis: Unsteadiness on feet (R26.81);History of falling (Z91.81)     Time: 8413-2440 PT Time Calculation (min) (ACUTE ONLY): 20 min  Charges:    $Gait Training: 8-22 mins PT General Charges $$ ACUTE PT VISIT: 1 Visit                     Blanchard Kelch PT Acute Rehabilitation Services Office [phone] Weekend pager-[phone]    Rada Hay 08/01/2022, 8:59 AM

## 2022-08-01 NOTE — Progress Notes (Signed)
   Reviewed w/ Dr. Elvera Lennox - patient is w/o siig pain, had good response to SMOG enema  OK for DC and no need for Abx  Scheduled bowel regimen - Linzess, senna and MiraLax  Can be adjusted at upcoming appt w/ Dr. Melchor Amour, MD, [facility] See Loretha Stapler on call - gastroenterology for best contact person 08/01/2022 11:59 AM

## 2022-08-01 NOTE — Plan of Care (Signed)
Pt alert and oriented with intermittent confusion.  Pt educated on call light but frequently forgets and hits call light without needs.  Pt up to [facility] with hands on assist with continent/incontinent episodes.  Pt with two BM post enema, continues to have smudges of stool throughout shift when urinating. Pt with pain controlled by prn pain medication.  Pt sleeping intermittently throughout shift.

## 2022-08-01 NOTE — Progress Notes (Signed)
Progress Note     Subjective: Pt sitting up in chair and eating breakfast. Reports many BM after enema yesterday. Denies abdominal pain.   Objective: Vital signs in last 24 hours: Temp:  [97.8 F (36.6 C)-97.9 F (36.6 C)] 97.8 F (36.6 C) (07/18 0841) Pulse Rate:  [77-91] 91 (07/18 0841) Resp:  [18] 18 (07/18 0841) BP: (132-141)/(56-87) 132/87 (07/18 0841) SpO2:  [94 %-100 %] 100 % (07/18 0841) Last BM Date : 07/31/22  Intake/Output from previous day: 07/17 0701 - 07/18 0700 In: 300 [IV Piggyback:300] Out: -  Intake/Output this shift: Total I/O In: 120 [P.O.:120] Out: -   PE: General: pleasant, WD, elderly female who is sitting in chair in NAD Lungs:  Respiratory effort nonlabored Abd: soft, NT, ND   Lab Results:  Recent Labs    07/30/22 1004 07/31/22 0540  WBC 4.2 4.5  HGB 8.1* 8.5*  HCT 25.8* 26.7*  PLT 145* 135*   BMET Recent Labs    07/30/22 1004 07/31/22 0540  NA 138 132*  K 3.8 3.2*  CL 106 101  CO2 23 23  GLUCOSE 96 91  BUN 7* 7*  CREATININE 0.73 0.50  CALCIUM 8.5* 8.5*   PT/INR No results for input(s): "LABPROT", "INR" in the last 72 hours. CMP     Component Value Date/Time   NA 132 (L) 07/31/2022 0540   NA 140 09/28/2019 0948   K 3.2 (L) 07/31/2022 0540   K 3.5 03/19/2011 0722   CL 101 07/31/2022 0540   CO2 23 07/31/2022 0540   GLUCOSE 91 07/31/2022 0540   BUN 7 (L) 07/31/2022 0540   BUN 12 09/28/2019 0948   CREATININE 0.50 07/31/2022 0540   CREATININE 0.79 07/23/2022 0929   CALCIUM 8.5 (L) 07/31/2022 0540   PROT 6.3 (L) 07/30/2022 1004   PROT 7.0 09/28/2019 0948   ALBUMIN 3.2 (L) 07/30/2022 1004   ALBUMIN 4.2 09/28/2019 0948   AST 17 07/30/2022 1004   AST 14 (L) 07/23/2022 0929   ALT 13 07/30/2022 1004   ALT 10 07/23/2022 0929   ALKPHOS 63 07/30/2022 1004   BILITOT 0.2 (L) 07/30/2022 1004   BILITOT 0.3 07/23/2022 0929   GFRNONAA >60 07/31/2022 0540   GFRNONAA >60 07/23/2022 0929   GFRAA 89 09/28/2019 0948   Lipase      Component Value Date/Time   LIPASE 25 07/29/2022 1556       Studies/Results: No results found.  Anti-infectives: Anti-infectives (From admission, onward)    Start     Dose/Rate Route Frequency Ordered Stop   07/30/22 1700  cefTRIAXone (ROCEPHIN) 2 g in sodium chloride 0.9 % 100 mL IVPB        2 g 200 mL/hr over 30 Minutes Intravenous Every 24 hours 07/29/22 1856     07/30/22 0600  metroNIDAZOLE (FLAGYL) IVPB 500 mg        500 mg 100 mL/hr over 60 Minutes Intravenous Every 12 hours 07/29/22 1856     07/29/22 1715  cefTRIAXone (ROCEPHIN) 2 g in sodium chloride 0.9 % 100 mL IVPB       Placed in "And" Linked Group   2 g 200 mL/hr over 30 Minutes Intravenous  Once 07/29/22 1713 07/29/22 1807   07/29/22 1715  metroNIDAZOLE (FLAGYL) IVPB 500 mg       Placed in "And" Linked Group   500 mg 100 mL/hr over 60 Minutes Intravenous  Once 07/29/22 1713 07/30/22 0700        Assessment/Plan  Diverticulosis  Constipation with proctitis  - CT 7/15 with extensive sigmoid diverticulosis (addended) and circumferential rectal thickening  - pt seen by GI and recommended for SMOG enema with good results - no abdominal pain today  - no indication for surgical intervention - agree with GI for aggressive bowel regimen at home and outpatient follow up  - surgery will sign off   FEN: FLD, bowel regimen  VTE: ok to have SQH or LMWH from surgery standpoint ID: [email or web address]   LOS: 3 days   I reviewed Consultant GI notes, hospitalist notes, last 24 h vitals and pain scores, last 48 h intake and output, last 24 h labs and trends, and last 24 h imaging results.   Juliet Rude, [facility] Surgery 08/01/2022, 10:34 AM Please see Amion for pager number during day hours 7:00am-4:30pm

## 2022-08-01 NOTE — Discharge Summary (Signed)
Physician Discharge Summary  [name]:[id number] DOB: 1950-08-04 DOA: 07/29/2022  PCP: [name], NP  Admit date: 07/29/2022 Discharge date: 08/01/2022  Admitted From: home Disposition:  home  Recommendations for Outpatient Follow-up:  Follow up with PCP in 1-2 weeks Follow up with Dr Marina Goodell as scheduled in 6 days  Home Health: PT Equipment/Devices: none  Discharge Condition: stable CODE STATUS: Full code  HPI: Per admitting MD, [name] is a 72 y.o. female with medical history significant of anxiety, depression, aortic atherosclerosis, bilateral cataracts carpal tunnel syndrome cervical cancer chronic back pain, claudication, COPD cervical DDD, lumbar DDD, blood transfusion, migraine headaches, hypertension, iron deficiency anemia, pulm nodule osteoarthritis, history of pneumonia, prediabetes, Raynaud's phenomenon, sleep apnea not on CPAP, spondylolysis, history of nonhemorrhagic CVA, tennis elbow syndrome, urinary urgency, varicose veins, colon polyps, GERD, gastroparesis, constipation, diverticulosis who is coming to the emergency department with complaints of abdominal pain and constipation for the past 2 weeks, in which she has not been able to have a "good bowel movement". He denied fever, chills, rhinorrhea, sore throat, wheezing or hemoptysis.  No chest pain, palpitations, diaphoresis, PND, orthopnea or pitting edema of the lower extremities.  No abdominal pain, nausea, emesis, diarrhea, constipation, melena or hematochezia.  No flank pain, dysuria, frequency or hematuria.  No polyuria, polydipsia, polyphagia or blurred vision.   Hospital Course / Discharge diagnoses: Principal Problem:   Stercoral proctitis Active Problems:   Essential hypertension   GERD   Gastroparesis   Diverticulosis of colon   Constipation   Tobacco abuse   OSA (obstructive sleep apnea)   COPD (chronic obstructive pulmonary disease) (HCC)   Normocytic anemia   Hypokalemia    LLQ pain   Principal problem Stercoral ulcers -patient was admitted to the hospital with acute on chronic constipation, a CT scan was initially done that showed concern for diverticulitis but also rectal thickening on the CT scan.  General surgery and GI were consulted.  After reviewing the CT scan with radiology, diverticulitis was less likely and it was felt to have significant diverticulosis.  She received an aggressive bowel regimen with excellent results, has been having good bowel movements and her abdominal pain has resolved.  She is tolerating a regular diet, discussed with gastroenterology and she will be discharged home in stable condition.  She will go on scheduled Linzess, senna, MiraLAX and has outpatient follow-up with gastroenterology in 6 days  Active problems Hypokalemia - Replaced prior to discharge.  Hyperlipidemia - resume Crestor.  Restless leg syndrome - Resume Requip.  Chronic pain - Resume home pain meds.  Hyponatremia - Na stable Hypertension -continue metoprolol, resume losartan today H/o anxiety and depression - continue home meds.  Anemia of chronic disease - Hemoglobin stable around 8.  Mild thrombocytopenia - Monitor.   Sepsis ruled out   Discharge Instructions   Allergies as of 08/01/2022       Reactions   Bee Venom Anaphylaxis   Morphine And Codeine Other (See Comments)   Overly sedated   Neurontin [gabapentin] Other (See Comments)   Nightmares    Codeine Hives   Adhesive [tape] Itching, Other (See Comments)   Redness, Paper tape is ok   Sulfa Antibiotics Other (See Comments)   Childhood allergy        Medication List     STOP taking these medications    cephALEXin 500 MG capsule Commonly known as: KEFLEX   fluconazole 100 MG tablet Commonly known as: DIFLUCAN   metoCLOPramide  5 MG tablet Commonly known as: REGLAN   potassium chloride SA 20 MEQ tablet Commonly known as: KLOR-CON M       TAKE these medications     acetaminophen 325 MG tablet Commonly known as: TYLENOL Take 650 mg by mouth 2 (two) times daily as needed for headache or mild pain.   albuterol 108 (90 Base) MCG/ACT inhaler Commonly known as: VENTOLIN HFA Inhale 2 puffs into the lungs 2 (two) times daily as needed for wheezing or shortness of breath. What changed: Another medication with the same name was changed. Make sure you understand how and when to take each.   albuterol (2.5 MG/3ML) 0.083% nebulizer solution Commonly known as: PROVENTIL Inhale 3 mLs into the lungs every 4 (four) hours as needed for wheezing or shortness of breath (cough). What changed: when to take this   aspirin EC 81 MG tablet Commonly known as: Aspirin 81 Take 1 tablet (81 mg total) by mouth daily.   busPIRone 5 MG tablet Commonly known as: BUSPAR Take 1 tablet (5 mg total) by mouth 2 (two) times daily.   DULoxetine 60 MG capsule Commonly known as: Cymbalta Take 2 capsules (120 mg total) by mouth daily. What changed:  how much to take when to take this Another medication with the same name was removed. Continue taking this medication, and follow the directions you see here.   hydrocortisone 25 MG suppository Commonly known as: ANUSOL-HC Place 1 suppository (25 mg total) rectally 2 (two) times daily as needed for hemorrhoids or anal itching.   lidocaine-prilocaine cream Commonly known as: EMLA Apply 30 minutes before appointment   linaclotide 290 MCG Caps capsule Commonly known as: Linzess Take 1 capsule (290 mcg total) by mouth daily before breakfast. What changed:  when to take this reasons to take this   LORazepam 0.5 MG tablet Commonly known as: ATIVAN Take 1 tablet 30 minutes before your MRI What changed:  how much to take how to take this when to take this additional instructions   losartan 25 MG tablet Commonly known as: COZAAR Take 25 mg by mouth daily.   methocarbamol 500 MG tablet Commonly known as: ROBAXIN Take 1  tablet (500 mg total) by mouth every 6 hours as needed. What changed: reasons to take this   metoprolol succinate 25 MG 24 hr tablet Commonly known as: TOPROL-XL TAKE 1 TABLET BY MOUTH DAILY. TAKE WITH OR IMMEDIATELY FOLLOWING A MEAL.   mupirocin ointment 2 % Commonly known as: BACTROBAN Apply 1 Application topically 2 (two) times daily.   nitroGLYCERIN 0.4 MG SL tablet Commonly known as: NITROSTAT Place 1 tablet (0.4 mg total) under the tongue every 5 (five) minutes x 3 doses as needed for chest pain.   ondansetron 8 MG tablet Commonly known as: ZOFRAN Take 1 tablet (8 mg total) by mouth every 8 (eight) hours as needed for nausea or vomiting.   Oxycodone HCl 10 MG Tabs Take 1 tablet (10 mg total) by mouth every 4 hours as needed for pain What changed: Another medication with the same name was removed. Continue taking this medication, and follow the directions you see here.   pantoprazole 40 MG tablet Commonly known as: PROTONIX Take 1 tablet (40 mg total) by mouth daily as needed (acid reflux).   polyethylene glycol 17 g packet Commonly known as: MiraLax Take 17 g by mouth 2 (two) times daily.   prochlorperazine 10 MG tablet Commonly known as: COMPAZINE Take 1 tablet (10 mg total) by mouth  every 6 (six) hours as needed for nausea or vomiting.   rOPINIRole 1 MG tablet Commonly known as: REQUIP Take 1 tablet (1 mg total) by mouth 3 (three) times daily as directed What changed:  how much to take when to take this additional instructions   rosuvastatin 10 MG tablet Commonly known as: CRESTOR Take 10 mg by mouth at bedtime.   senna 8.6 MG tablet Commonly known as: SENOKOT Take 1 tablet (8.6 mg total) by mouth daily. What changed:  how much to take when to take this reasons to take this   Stiolto Respimat 2.5-2.5 MCG/ACT Aers Generic drug: Tiotropium Bromide-Olodaterol Inhale 2 puffs into the lungs daily. What changed:  when to take this reasons to take this    traZODone 50 MG tablet Commonly known as: DESYREL TAKE 0.5-1 TABLETS (25-50 MG TOTAL) BY MOUTH AT BEDTIME AS NEEDED FOR SLEEP. What changed:  how much to take when to take this       Consultations: GI Surgery  Procedures/Studies:  CT ABDOMEN PELVIS W CONTRAST  Addendum Date: 07/31/2022   ADDENDUM REPORT: 07/31/2022 14:28 ADDENDUM: Extensive sigmoid colon diverticulosis. Electronically Signed   By: Ted Mcalpine M.D.   On: 07/31/2022 14:28   Result Date: 07/31/2022 CLINICAL DATA:  Left lower quadrant abdominal pain. EXAM: CT ABDOMEN AND PELVIS WITH CONTRAST TECHNIQUE: Multidetector CT imaging of the abdomen and pelvis was performed using the standard protocol following bolus administration of intravenous contrast. RADIATION DOSE REDUCTION: This exam was performed according to the departmental dose-optimization program which includes automated exposure control, adjustment of the mA and/or kV according to patient size and/or use of iterative reconstruction technique. CONTRAST:  OMNIPAQUE IOHEXOL 300 MG/ML  SOLN COMPARISON:  July 12, 2022 FINDINGS: Lower chest: Chronic interstitial lung changes. Previously noted minute pulmonary nodules in the right middle lobe are stable. Hepatobiliary: No focal liver abnormality is seen. Status post cholecystectomy. No biliary dilatation. Pancreas: Unremarkable. No pancreatic ductal dilatation or surrounding inflammatory changes. Spleen: Normal in size without focal abnormality. Adrenals/Urinary Tract: Adrenal glands are unremarkable. Kidneys are normal, without renal calculi, focal lesion, or hydronephrosis. Bladder is unremarkable. Stomach/Bowel: Stomach is within normal limits. Appendix appears normal. No evidence of small bowel wall thickening, distention, or inflammatory changes. Extensive sigmoid colon diverticulitis. Circumferential thickening of the rectum. Vascular/Lymphatic: No significant vascular findings are present. No enlarged abdominal  or pelvic lymph nodes. Reproductive: Status post hysterectomy. No adnexal masses. Other: No abdominal wall hernia or abnormality. No abdominopelvic ascites. Musculoskeletal: Stable sclerotic changes in the spine. Stable compression deformity of T12 vertebral body. IMPRESSION: 1. Extensive sigmoid colon diverticulitis. 2. Circumferential thickening of the rectum. Please correlate with clinical symptoms of proctitis versus internal hemorrhoids. 3. Stable sclerotic changes in the spine. 4. Stable compression deformity of T12 vertebral body. 5. Chronic interstitial lung changes. 6. Previously noted minute pulmonary nodules in the right middle lobe are stable. Electronically Signed: By: Ted Mcalpine M.D. On: 07/29/2022 17:05   CT CHEST ABDOMEN PELVIS W CONTRAST  Result Date: 07/12/2022 CLINICAL DATA:  History of small cell lung cancer. Constipation and diarrhea 4 days. EXAM: CT CHEST, ABDOMEN, AND PELVIS WITH CONTRAST TECHNIQUE: Multidetector CT imaging of the chest, abdomen and pelvis was performed following the standard protocol during bolus administration of intravenous contrast. RADIATION DOSE REDUCTION: This exam was performed according to the departmental dose-optimization program which includes automated exposure control, adjustment of the mA and/or kV according to patient size and/or use of iterative reconstruction technique. CONTRAST:  OMNIPAQUE  IOHEXOL 300 MG/ML  SOLN COMPARISON:  05/24/2022 FINDINGS: CT CHEST FINDINGS Cardiovascular: Right-sided Port-A-Cath with tip over the cavoatrial junction. Heart is normal size. Mild calcified plaque over the mitral valve annulus. Mild calcified plaque over the 3 vessel coronary arteries. Thoracic aorta is normal in caliber. Pulmonary arterial system is unremarkable. Remaining vascular structures are unremarkable. Mediastinum/Nodes: No significant mediastinal or hilar adenopathy. Few small subcentimeter mediastinal lymph nodes are unchanged. Calcified  precarinal lymph node. Lungs/Pleura: Lungs are adequately inflated. There multiple small subcentimeter right lung nodules which are unchanged. Patchy peripheral opacities over the right lower lobe mostly unchanged although there is a new new subpleural 9 mm nodule over the posterior right lower lobe. Mild bilateral patchy subpleural hazy interstitial prominence unchanged. No effusion. Airways are normal. Musculoskeletal: Patchy sclerosis over the bony structures unchanged compatible with diffuse osseous metastatic disease. CT ABDOMEN PELVIS FINDINGS Hepatobiliary: Previous cholecystectomy. Stable subcentimeter hypodensity over the right lobe of the liver. Biliary tree is normal. Pancreas: Normal. Spleen: Normal. Adrenals/Urinary Tract: Adrenal glands are normal. Kidneys are normal in size without hydronephrosis or nephrolithiasis. Ureters and bladder are normal. Stomach/Bowel: Stomach and small bowel are normal. Appendix is normal. There is diverticulosis of the colon. Mild wall thickening of the rectum which may be seen with proctitis. Vascular/Lymphatic: Moderate calcified plaque over the abdominal aorta which is normal in caliber. Remaining vascular structures are unremarkable. No significant adenopathy. Reproductive: Previous hysterectomy. Other: No significant free fluid. Musculoskeletal: Patchy sclerosis over the spine, iliac bones and sternum likely due to diffuse metastatic disease as this is not significantly changed. IMPRESSION: 1. Wall thickening of the rectum which may be seen with proctitis. 2. Multiple small subcentimeter right lung nodules which are unchanged. Patchy peripheral opacities over the right lower lobe unchanged with the exception of a new 9 mm subpleural nodule over the posterior right lower lobe. Recommend attention on follow-up. 3. Stable subcentimeter hypodensity over the right lobe of the liver. 4. Stable patchy subpleural interstitial changes. 5. Diverticulosis of the colon. 6.  Diffuse osseous metastatic disease unchanged. 7. Aortic atherosclerosis. Atherosclerotic coronary artery disease. Aortic Atherosclerosis (ICD10-I70.0). Electronically Signed   By: Elberta Fortis M.D.   On: 07/12/2022 18:10     Subjective: - no chest pain, shortness of breath, no abdominal pain, nausea or vomiting.   Discharge Exam: BP 132/87 (BP Location: Right Arm)   Pulse 91   Temp 97.8 F (36.6 C) (Oral)   Resp 18   Ht 5' (1.524 m)   Wt 73.9 kg   SpO2 100%   BMI 31.83 kg/m   General: Pt is alert, awake, not in acute distress Cardiovascular: RRR, S1/S2 +, no rubs, no gallops Respiratory: CTA bilaterally, no wheezing, no rhonchi Abdominal: Soft, NT, ND, bowel sounds + Extremities: no edema, no cyanosis  The results of significant diagnostics from this hospitalization (including imaging, microbiology, ancillary and laboratory) are listed below for reference.     Microbiology: No results found for this or any previous visit (from the past 240 hour(s)).   Labs: Basic Metabolic Panel: Recent Labs  Lab 07/29/22 1556 07/30/22 1004 07/31/22 0540  NA 132* 138 132*  K 3.4* 3.8 3.2*  CL 98 106 101  CO2 27 23 23   GLUCOSE 114* 96 91  BUN 10 7* 7*  CREATININE 0.79 0.73 0.50  CALCIUM 8.8* 8.5* 8.5*  MG 1.7  --   --    Liver Function Tests: Recent Labs  Lab 07/29/22 1556 07/30/22 1004  AST 18 17  ALT  15 13  ALKPHOS 69 63  BILITOT 0.5 0.2*  PROT 6.9 6.3*  ALBUMIN 3.4* 3.2*   CBC: Recent Labs  Lab 07/29/22 1556 07/30/22 1004 07/31/22 0540  WBC 4.5 4.2 4.5  NEUTROABS 3.5  --  3.2  HGB 8.6* 8.1* 8.5*  HCT 27.6* 25.8* 26.7*  MCV 94.8 96.6 94.0  PLT 171 145* 135*   CBG: No results for input(s): "GLUCAP" in the last 168 hours. Hgb A1c No results for input(s): "HGBA1C" in the last 72 hours. Lipid Profile No results for input(s): "CHOL", "HDL", "LDLCALC", "TRIG", "CHOLHDL", "LDLDIRECT" in the last 72 hours. Thyroid function studies No results for input(s):  "TSH", "T4TOTAL", "T3FREE", "THYROIDAB" in the last 72 hours.  Invalid input(s): "FREET3" Urinalysis    Component Value Date/Time   COLORURINE STRAW (A) 07/29/2022 1814   APPEARANCEUR CLEAR 07/29/2022 1814   LABSPEC 1.016 07/29/2022 1814   PHURINE 6.0 07/29/2022 1814   GLUCOSEU NEGATIVE 07/29/2022 1814   HGBUR MODERATE (A) 07/29/2022 1814   BILIRUBINUR NEGATIVE 07/29/2022 1814   KETONESUR NEGATIVE 07/29/2022 1814   PROTEINUR NEGATIVE 07/29/2022 1814   UROBILINOGEN 1.0 08/24/2012 1409   NITRITE NEGATIVE 07/29/2022 1814   LEUKOCYTESUR NEGATIVE 07/29/2022 1814    FURTHER DISCHARGE INSTRUCTIONS:   Get Medicines reviewed and adjusted: Please take all your medications with you for your next visit with your Primary MD   Laboratory/radiological data: Please request your Primary MD to go over all hospital tests and procedure/radiological results at the follow up, please ask your Primary MD to get all Hospital records sent to his/her office.   In some cases, they will be blood work, cultures and biopsy results pending at the time of your discharge. Please request that your primary care M.D. goes through all the records of your hospital data and follows up on these results.   Also Note the following: If you experience worsening of your admission symptoms, develop shortness of breath, life threatening emergency, suicidal or homicidal thoughts you must seek medical attention immediately by calling 911 or calling your MD immediately  if symptoms less severe.   You must read complete instructions/literature along with all the possible adverse reactions/side effects for all the Medicines you take and that have been prescribed to you. Take any new Medicines after you have completely understood and accpet all the possible adverse reactions/side effects.    Do not drive when taking Pain medications or sleeping medications (Benzodaizepines)   Do not take more than prescribed Pain, Sleep and Anxiety  Medications. It is not advisable to combine anxiety,sleep and pain medications without talking with your primary care practitioner   Special Instructions: If you have smoked or chewed Tobacco  in the last 2 yrs please stop smoking, stop any regular Alcohol  and or any Recreational drug use.   Wear Seat belts while driving.   Please note: You were cared for by a hospitalist during your hospital stay. Once you are discharged, your primary care physician will handle any further medical issues. Please note that NO REFILLS for any discharge medications will be authorized once you are discharged, as it is imperative that you return to your primary care physician (or establish a relationship with a primary care physician if you do not have one) for your post hospital discharge needs so that they can reassess your need for medications and monitor your lab values.  Time coordinating discharge: 35 minutes  SIGNED:  Pamella Pert, MD, PhD 08/01/2022, 12:02 PM

## 2022-08-07 ENCOUNTER — Ambulatory Visit: Payer: Medicare Other | Admitting: Internal Medicine

## 2022-08-07 ENCOUNTER — Ambulatory Visit: Payer: Medicare Other | Admitting: Cardiology

## 2022-08-20 ENCOUNTER — Inpatient Hospital Stay: Payer: Medicare Other | Attending: Radiation Oncology | Admitting: Internal Medicine

## 2022-08-20 ENCOUNTER — Inpatient Hospital Stay: Payer: Medicare Other | Admitting: Dietician

## 2022-08-20 ENCOUNTER — Other Ambulatory Visit: Payer: Self-pay | Admitting: Internal Medicine

## 2022-08-20 ENCOUNTER — Other Ambulatory Visit: Payer: Self-pay

## 2022-08-20 ENCOUNTER — Inpatient Hospital Stay: Payer: Medicare Other

## 2022-08-20 ENCOUNTER — Encounter: Payer: Self-pay | Admitting: Internal Medicine

## 2022-08-20 VITALS — BP 146/83 | HR 93 | Temp 98.1°F | Resp 20 | Wt 150.8 lb

## 2022-08-20 DIAGNOSIS — F17200 Nicotine dependence, unspecified, uncomplicated: Secondary | ICD-10-CM | POA: Diagnosis not present

## 2022-08-20 DIAGNOSIS — R410 Disorientation, unspecified: Secondary | ICD-10-CM | POA: Diagnosis not present

## 2022-08-20 DIAGNOSIS — K5900 Constipation, unspecified: Secondary | ICD-10-CM | POA: Insufficient documentation

## 2022-08-20 DIAGNOSIS — Z79899 Other long term (current) drug therapy: Secondary | ICD-10-CM | POA: Diagnosis not present

## 2022-08-20 DIAGNOSIS — C3431 Malignant neoplasm of lower lobe, right bronchus or lung: Secondary | ICD-10-CM | POA: Diagnosis present

## 2022-08-20 DIAGNOSIS — C3491 Malignant neoplasm of unspecified part of right bronchus or lung: Secondary | ICD-10-CM

## 2022-08-20 DIAGNOSIS — C7931 Secondary malignant neoplasm of brain: Secondary | ICD-10-CM | POA: Diagnosis present

## 2022-08-20 DIAGNOSIS — C7951 Secondary malignant neoplasm of bone: Secondary | ICD-10-CM | POA: Insufficient documentation

## 2022-08-20 DIAGNOSIS — C3490 Malignant neoplasm of unspecified part of unspecified bronchus or lung: Secondary | ICD-10-CM | POA: Diagnosis not present

## 2022-08-20 DIAGNOSIS — D649 Anemia, unspecified: Secondary | ICD-10-CM | POA: Insufficient documentation

## 2022-08-20 DIAGNOSIS — Z95828 Presence of other vascular implants and grafts: Secondary | ICD-10-CM

## 2022-08-20 LAB — CBC WITH DIFFERENTIAL (CANCER CENTER ONLY)
Abs Immature Granulocytes: 0.01 10*3/uL (ref 0.00–0.07)
Basophils Absolute: 0 10*3/uL (ref 0.0–0.1)
Basophils Relative: 0 %
Eosinophils Absolute: 0.1 10*3/uL (ref 0.0–0.5)
Eosinophils Relative: 2 %
HCT: 29.8 % — ABNORMAL LOW (ref 36.0–46.0)
Hemoglobin: 9.6 g/dL — ABNORMAL LOW (ref 12.0–15.0)
Immature Granulocytes: 0 %
Lymphocytes Relative: 16 %
Lymphs Abs: 0.7 10*3/uL (ref 0.7–4.0)
MCH: 28.9 pg (ref 26.0–34.0)
MCHC: 32.2 g/dL (ref 30.0–36.0)
MCV: 89.8 fL (ref 80.0–100.0)
Monocytes Absolute: 0.3 10*3/uL (ref 0.1–1.0)
Monocytes Relative: 8 %
Neutro Abs: 3.3 10*3/uL (ref 1.7–7.7)
Neutrophils Relative %: 74 %
Platelet Count: 145 10*3/uL — ABNORMAL LOW (ref 150–400)
RBC: 3.32 MIL/uL — ABNORMAL LOW (ref 3.87–5.11)
RDW: 15.3 % (ref 11.5–15.5)
WBC Count: 4.5 10*3/uL (ref 4.0–10.5)
nRBC: 0 % (ref 0.0–0.2)

## 2022-08-20 LAB — CMP (CANCER CENTER ONLY)
ALT: 12 U/L (ref 0–44)
AST: 17 U/L (ref 15–41)
Albumin: 3.8 g/dL (ref 3.5–5.0)
Alkaline Phosphatase: 67 U/L (ref 38–126)
Anion gap: 7 (ref 5–15)
BUN: 10 mg/dL (ref 8–23)
CO2: 29 mmol/L (ref 22–32)
Calcium: 9 mg/dL (ref 8.9–10.3)
Chloride: 100 mmol/L (ref 98–111)
Creatinine: 0.76 mg/dL (ref 0.44–1.00)
GFR, Estimated: >60 mL/min (ref >60)
Glucose, Bld: 94 mg/dL (ref 70–99)
Potassium: 3.1 mmol/L — ABNORMAL LOW (ref 3.5–5.1)
Sodium: 136 mmol/L (ref 135–145)
Total Bilirubin: 0.4 mg/dL (ref 0.3–1.2)
Total Protein: 6.9 g/dL (ref 6.5–8.1)

## 2022-08-20 MED ORDER — SODIUM CHLORIDE 0.9% FLUSH
10.0000 mL | Freq: Once | INTRAVENOUS | Status: AC
  Administered 2022-08-20: 10 mL

## 2022-08-20 MED ORDER — SODIUM CHLORIDE 0.9% FLUSH: 10.0000 mL | Freq: Once | INTRAVENOUS | Status: DC

## 2022-08-20 MED ORDER — HEPARIN SOD (PORK) LOCK FLUSH 100 UNIT/ML IV SOLN
500.0000 [IU] | Freq: Once | INTRAVENOUS | Status: AC
  Administered 2022-08-20: 500 [IU]

## 2022-08-20 MED ORDER — FLUCONAZOLE 100 MG PO TABS: 100.0000 mg | ORAL_TABLET | Freq: Every day | ORAL | 0 refills | Status: DC

## 2022-08-20 MED ORDER — POTASSIUM CHLORIDE CRYS ER 20 MEQ PO TBCR: 20.0000 meq | EXTENDED_RELEASE_TABLET | Freq: Every day | ORAL | 0 refills | Status: DC

## 2022-08-20 NOTE — Progress Notes (Signed)
[facility] [phone]   [phone]) [phone]  OFFICE PROGRESS NOTE  [name], NP [address] [place] [address]  DIAGNOSIS: Extensive stage (T4, N2, M1 C) small cell lung cancer presented with large right lower lobe consolidative mass in addition to right hilar and mediastinal lymphadenopathy and extensive bone metastasis diagnosed in November 2023.   PRIOR THERAPY: None  CURRENT THERAPY: Palliative systemic chemotherapy with carboplatin for AUC of 5 on day 1, etoposide 100 Mg/M2 on days 1, 2 and 3 with Cosela 240 Mg/M2 on the days of the chemotherapy as well as Imfinzi 1500 mg IV on day 1 every 3 weeks during the induction phase and then every 4 weeks during the maintenance if there is no evidence for disease progression.  Status post 9 cycles.  Starting from cycle #5 she will be on maintenance treatment with Imfinzi 1500 Mg IV every 4 weeks.  INTERVAL HISTORY: [name] 72 y.o. female returns to the clinic today for follow-up visit accompanied by her daughter [name].  The patient is complaining of increasing fatigue and weakness as well as feeling lumps in the inguinal area.  She also has moments of confusion and nausea.  She denied having any current chest pain, shortness of breath, cough or hemoptysis.  She has no fever or chills.  She was supposed to have repeat imaging studies including MRI of the brain before this visit but unfortunately she has some metals and MRI could not be done.  She is here today for evaluation before starting cycle #10.  MEDICAL HISTORY: Past Medical History:  Diagnosis Date   Anxiety    Aortic atherosclerosis (HCC)    Bilateral cataracts    Carpal tunnel syndrome    Cervical cancer (HCC)    Chronic back pain    Claudication (HCC)    COPD (chronic obstructive pulmonary disease) (HCC)    wheezing   DDD (degenerative disc disease), cervical    DDD (degenerative disc disease), lumbar    Depression     Diverticulosis    Dyspnea    on exertion   Gastroparesis    GERD (gastroesophageal reflux disease)    Hearing loss    Bilateral   History of blood transfusion    History of colon polyps    History of migraine    History of radiation therapy    Left scapula,left chest, right pelvis- 12/20/21-01/02/22- Dr. 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  MEDICATIONS:  Current Outpatient Medications  Medication Sig Dispense Refill  acetaminophen (TYLENOL) 325 MG tablet Take 650 mg by mouth 2 (two) times daily as needed for headache or mild pain.     albuterol (PROVENTIL) (2.5 MG/3ML) 0.083% nebulizer solution Inhale 3 mLs into the lungs every 4 (four) hours as needed for wheezing or shortness of breath (cough). (Patient taking differently: Inhale 3 mLs into the lungs 2 (two) times daily as needed for wheezing or shortness of breath (cough).) 75 mL 12   albuterol (VENTOLIN HFA) 108 (90 Base) MCG/ACT inhaler Inhale 2 puffs into the lungs 2 (two) times daily as needed for wheezing or shortness of breath.     aspirin EC (ASPIRIN 81) 81 MG tablet Take 1 tablet (81 mg total) by mouth daily. 90 tablet 3   busPIRone (BUSPAR) 5 MG tablet Take 1 tablet (5 mg total) by mouth 2 (two) times daily.  (Patient not taking: Reported on 07/29/2022) 60 tablet 0   DULoxetine (CYMBALTA) 60 MG capsule Take 2 capsules (120 mg total) by mouth daily. (Patient taking differently: Take 60 mg by mouth in the morning and at bedtime.) 60 capsule 3   hydrocortisone (ANUSOL-HC) 25 MG suppository Place 1 suppository (25 mg total) rectally 2 (two) times daily as needed for hemorrhoids or anal itching. 12 suppository 0   lidocaine-prilocaine (EMLA) cream Apply 30 minutes before appointment 30 g 2   linaclotide (LINZESS) 290 MCG CAPS capsule Take 1 capsule (290 mcg total) by mouth daily before breakfast. 30 capsule 0   LORazepam (ATIVAN) 0.5 MG tablet Take 1 tablet 30 minutes before your MRI (Patient taking differently: Take 0.5 mg by mouth See admin instructions. Take 0.5 mg by mouth 30 minutes before MRI) 2 tablet 0   losartan (COZAAR) 25 MG tablet Take 25 mg by mouth daily.     methocarbamol (ROBAXIN) 500 MG tablet Take 1 tablet (500 mg total) by mouth every 6 hours as needed. (Patient taking differently: Take 500 mg by mouth every 6 (six) hours as needed for muscle spasms.) 120 tablet 1   metoprolol succinate (TOPROL-XL) 25 MG 24 hr tablet TAKE 1 TABLET BY MOUTH DAILY. TAKE WITH OR IMMEDIATELY FOLLOWING A MEAL. 90 tablet 3   mupirocin ointment (BACTROBAN) 2 % Apply 1 Application topically 2 (two) times daily. 20 g 0   nitroGLYCERIN (NITROSTAT) 0.4 MG SL tablet Place 1 tablet (0.4 mg total) under the tongue every 5 (five) minutes x 3 doses as needed for chest pain. 25 tablet 1   ondansetron (ZOFRAN) 8 MG tablet Take 1 tablet (8 mg total) by mouth every 8 (eight) hours as needed for nausea or vomiting. 30 tablet 2   Oxycodone HCl 10 MG TABS Take 1 tablet (10 mg total) by mouth every 4 hours as needed for pain 180 tablet 0   pantoprazole (PROTONIX) 40 MG tablet Take 1 tablet (40 mg total) by mouth daily as needed (acid reflux). 30 tablet 2   polyethylene glycol (MIRALAX) 17 g packet Take 17 g by mouth 2 (two) times  daily. 60 each 0   prochlorperazine (COMPAZINE) 10 MG tablet Take 1 tablet (10 mg total) by mouth every 6 (six) hours as needed for nausea or vomiting. 30 tablet 2   rOPINIRole (REQUIP) 1 MG tablet Take 1 tablet (1 mg total) by mouth 3 (three) times daily as directed (Patient taking differently: Take 1-2 mg by mouth See admin instructions. Take 1 mg by mouth in the morning and 2 mg at bedtime) 270 tablet 1   rosuvastatin (CRESTOR) 10 MG  tablet Take 10 mg by mouth at bedtime.     senna (SENOKOT) 8.6 MG tablet Take 1 tablet (8.6 mg total) by mouth daily. 30 tablet 0   Tiotropium Bromide-Olodaterol (STIOLTO RESPIMAT) 2.5-2.5 MCG/ACT AERS Inhale 2 puffs into the lungs daily. (Patient taking differently: Inhale 2 puffs into the lungs daily as needed (for respiratory flares).) 1 each 5   traZODone (DESYREL) 50 MG tablet TAKE 0.5-1 TABLETS (25-50 MG TOTAL) BY MOUTH AT BEDTIME AS NEEDED FOR SLEEP. (Patient taking differently: Take 50 mg by mouth at bedtime.) 90 tablet 1   No current facility-administered medications for this visit.    SURGICAL HISTORY:  Past Surgical History:  Procedure Laterality Date   BACK SURGERY     BIOPSY  08/31/2021   Procedure: BIOPSY;  Surgeon: Tressia Danas, MD;  Location: [facility] ENDOSCOPY;  Service: Gastroenterology;;   BLEPHAROPLASTY  10/10/2017   BRONCHIAL BIOPSY  11/21/2021   Procedure: BRONCHIAL BIOPSIES;  Surgeon: Oretha Milch, MD;  Location: [facility] ENDOSCOPY;  Service: Cardiopulmonary;;   BRONCHIAL BRUSHINGS  11/21/2021   Procedure: BRONCHIAL BRUSHINGS;  Surgeon: Oretha Milch, MD;  Location: [facility] ENDOSCOPY;  Service: Cardiopulmonary;;   BRONCHIAL NEEDLE ASPIRATION BIOPSY  11/21/2021   Procedure: BRONCHIAL NEEDLE ASPIRATION BIOPSIES;  Surgeon: Oretha Milch, MD;  Location: [facility] ENDOSCOPY;  Service: Cardiopulmonary;;   BRONCHIAL WASHINGS  11/21/2021   Procedure: BRONCHIAL WASHINGS;  Surgeon: Oretha Milch, MD;  Location: [facility] ENDOSCOPY;  Service: Cardiopulmonary;;   CATARACT  EXTRACTION, BILATERAL     CESAREAN SECTION     CHOLECYSTECTOMY     COLONOSCOPY  10/2016   COLONOSCOPY WITH PROPOFOL N/A 08/31/2021   Procedure: COLONOSCOPY WITH PROPOFOL;  Surgeon: Tressia Danas, MD;  Location: [facility] ENDOSCOPY;  Service: Gastroenterology;  Laterality: N/A;   ESOPHAGOGASTRODUODENOSCOPY (EGD) WITH PROPOFOL N/A 08/31/2021   Procedure: ESOPHAGOGASTRODUODENOSCOPY (EGD) WITH PROPOFOL;  Surgeon: Tressia Danas, MD;  Location: [facility] ENDOSCOPY;  Service: Gastroenterology;  Laterality: N/A;   HAND SURGERY Bilateral    carpal tunnel   HEMORRHOID SURGERY     HEMOSTASIS CONTROL  11/21/2021   Procedure: HEMOSTASIS CONTROL;  Surgeon: Oretha Milch, MD;  Location: [facility] ENDOSCOPY;  Service: Cardiopulmonary;;   INTERSTIM IMPLANT PLACEMENT     IR IMAGING GUIDED PORT INSERTION  01/17/2022   KNEE SURGERY Left    arthroscopy x3   LOWER EXTREMITY ANGIOGRAPHY N/A 07/16/2016   Procedure: Lower Extremity Angiography;  Surgeon: Yates Decamp, MD;  Location: [facility] INVASIVE CV LAB;  Service: Cardiovascular;  Laterality: N/A;   LOWER EXTREMITY INTERVENTION N/A 08/06/2016   Procedure: Lower Extremity Intervention;  Surgeon: Yates Decamp, MD;  Location: [facility] INVASIVE CV LAB;  Service: Cardiovascular;  Laterality: N/A;   NECK SURGERY     OTHER SURGICAL HISTORY  2013   bladder stimulator in back   PERIPHERAL VASCULAR BALLOON ANGIOPLASTY  08/06/2016   Procedure: Peripheral Vascular Balloon Angioplasty;  Surgeon: Yates Decamp, MD;  Location: [facility] INVASIVE CV LAB;  Service: Cardiovascular;;  Right SFA   POLYPECTOMY  08/31/2021   Procedure: POLYPECTOMY;  Surgeon: Tressia Danas, MD;  Location: [facility] ENDOSCOPY;  Service: Gastroenterology;;   TENNIS ELBOW RELEASE/NIRSCHEL PROCEDURE Right 12/2015   TOE SURGERY Right    right foot second and fourth toe   TOTAL HIP ARTHROPLASTY Left    TOTAL HIP REVISION Left 10/13/2017   Procedure: LEFT TOTAL HIP REVISION ACETABULUM, OPEN REDUCTION INTERNAL WITH BONE GRAFT FIXATION LEFT GREATER  TROCHANTERIC FRACTURE;  Surgeon: Gean Birchwood, MD;  Location: [facility] ORS;  Service: Orthopedics;  Laterality: Left;   TOTAL KNEE ARTHROPLASTY Right 01/12/2016   Procedure: RIGHT TOTAL KNEE ARTHROPLASTY;  Surgeon: Beverely Low, MD;  Location: [facility] OR;  Service: Orthopedics;  Laterality: Right;   TUBAL LIGATION     UPPER GI ENDOSCOPY  10/2016   VAGINAL HYSTERECTOMY     VIDEO BRONCHOSCOPY WITH ENDOBRONCHIAL ULTRASOUND N/A 11/21/2021   Procedure: VIDEO BRONCHOSCOPY WITH ENDOBRONCHIAL ULTRASOUND;  Surgeon: Oretha Milch, MD;  Location: [facility] ENDOSCOPY;  Service: Cardiopulmonary;  Laterality: N/A;    REVIEW OF SYSTEMS:  Constitutional: positive for fatigue Eyes: negative Ears, nose, mouth, throat, and face: negative Respiratory: negative Cardiovascular: negative Gastrointestinal: positive for constipation and nausea Genitourinary:negative Integument/breast: negative Hematologic/lymphatic: negative Musculoskeletal:positive for arthralgias and muscle weakness Neurological: positive for headaches Behavioral/Psych: negative Endocrine: negative Allergic/Immunologic: negative   PHYSICAL EXAMINATION: General appearance: alert, cooperative, fatigued, and no distress Head: Normocephalic, without obvious abnormality, atraumatic Neck: no adenopathy, no JVD, supple, symmetrical, trachea midline, and thyroid not enlarged, symmetric, no tenderness/mass/nodules Lymph nodes: Cervical, supraclavicular, and axillary nodes normal. Resp: clear to auscultation bilaterally Back: symmetric, no curvature. ROM normal. No CVA tenderness. Cardio: regular rate and rhythm, S1, S2 normal, no murmur, click, rub or gallop GI: soft, non-tender; bowel sounds normal; no masses,  no organomegaly Extremities: extremities normal, atraumatic, no cyanosis or edema Neurologic: Alert and oriented X 3, normal strength and tone. Normal symmetric reflexes. Normal coordination and gait  ECOG PERFORMANCE STATUS: 1 - Symptomatic but  completely ambulatory  Blood pressure (!) 146/83, pulse 93, temperature 98.1 F (36.7 C), temperature source Oral, resp. rate 20, weight 150 lb 12.8 oz (68.4 kg), SpO2 100%.  LABORATORY DATA: Lab Results  Component Value Date   WBC 4.5 08/20/2022   HGB 9.6 (L) 08/20/2022   HCT 29.8 (L) 08/20/2022   MCV 89.8 08/20/2022   PLT 145 (L) 08/20/2022      Chemistry      Component Value Date/Time   NA 132 (L) 07/31/2022 0540   NA 140 09/28/2019 0948   K 3.2 (L) 07/31/2022 0540   K 3.5 03/19/2011 0722   CL 101 07/31/2022 0540   CO2 23 07/31/2022 0540   BUN 7 (L) 07/31/2022 0540   BUN 12 09/28/2019 0948   CREATININE 0.50 07/31/2022 0540   CREATININE 0.79 07/23/2022 0929      Component Value Date/Time   CALCIUM 8.5 (L) 07/31/2022 0540   ALKPHOS 63 07/30/2022 1004   AST 17 07/30/2022 1004   AST 14 (L) 07/23/2022 0929   ALT 13 07/30/2022 1004   ALT 10 07/23/2022 0929   BILITOT 0.2 (L) 07/30/2022 1004   BILITOT 0.3 07/23/2022 0929       RADIOGRAPHIC STUDIES: CT ABDOMEN PELVIS W CONTRAST  Addendum Date: 07/31/2022   ADDENDUM REPORT: 07/31/2022 14:28 ADDENDUM: Extensive sigmoid colon diverticulosis. Electronically Signed   By: Ted Mcalpine M.D.   On: 07/31/2022 14:28   Result Date: 07/31/2022 CLINICAL DATA:  Left lower quadrant abdominal pain. EXAM: CT ABDOMEN AND PELVIS WITH CONTRAST TECHNIQUE: Multidetector CT imaging of the abdomen and pelvis was performed using the standard protocol following bolus administration of intravenous contrast. RADIATION DOSE REDUCTION: This exam was performed according to the departmental dose-optimization program which includes automated exposure control, adjustment of the mA and/or kV according to patient size and/or use of iterative reconstruction technique. CONTRAST:  OMNIPAQUE IOHEXOL 300 MG/ML  SOLN COMPARISON:  July 12, 2022 FINDINGS: Lower chest: Chronic interstitial lung changes. Previously noted minute pulmonary nodules in the right  middle lobe  are stable. Hepatobiliary: No focal liver abnormality is seen. Status post cholecystectomy. No biliary dilatation. Pancreas: Unremarkable. No pancreatic ductal dilatation or surrounding inflammatory changes. Spleen: Normal in size without focal abnormality. Adrenals/Urinary Tract: Adrenal glands are unremarkable. Kidneys are normal, without renal calculi, focal lesion, or hydronephrosis. Bladder is unremarkable. Stomach/Bowel: Stomach is within normal limits. Appendix appears normal. No evidence of small bowel wall thickening, distention, or inflammatory changes. Extensive sigmoid colon diverticulitis. Circumferential thickening of the rectum. Vascular/Lymphatic: No significant vascular findings are present. No enlarged abdominal or pelvic lymph nodes. Reproductive: Status post hysterectomy. No adnexal masses. Other: No abdominal wall hernia or abnormality. No abdominopelvic ascites. Musculoskeletal: Stable sclerotic changes in the spine. Stable compression deformity of T12 vertebral body. IMPRESSION: 1. Extensive sigmoid colon diverticulitis. 2. Circumferential thickening of the rectum. Please correlate with clinical symptoms of proctitis versus internal hemorrhoids. 3. Stable sclerotic changes in the spine. 4. Stable compression deformity of T12 vertebral body. 5. Chronic interstitial lung changes. 6. Previously noted minute pulmonary nodules in the right middle lobe are stable. Electronically Signed: By: Ted Mcalpine M.D. On: 07/29/2022 17:05    ASSESSMENT AND PLAN: This is a very pleasant 72 years old white female with Extensive stage (T4, N2, M1 C) small cell lung cancer presented with large right lower lobe consolidative mass in addition to right hilar and mediastinal lymphadenopathy and extensive bone metastasis diagnosed in November 2023.  The patient is currently undergoing systemic chemotherapy with Palliative systemic chemotherapy with carboplatin for AUC of 5 on day 1, etoposide 100  Mg/M2 on days 1, 2 and 3 with Cosela 240 Mg/M2 on the days of the chemotherapy as well as Imfinzi 1500 mg IV on day 1 every 3 weeks during the induction phase and then every 4 weeks during the maintenance if there is no evidence for disease progression.  Status post 9 cycles.  Starting from cycle #5 she will be on maintenance treatment with single agent Imfinzi 1500 Mg IV every 4 weeks.  Starting from cycle #5 she is on maintenance treatment with Imfinzi 1500 Mg IV every 4 weeks. The patient has been tolerating this treatment well with no concerning adverse effects. She has a lot of complaints recently including fatigue, confusion, nausea as well as weakness. I recommended for her to hold her treatment with cycle #10 today until we have repeat imaging studies and evaluate her disease. I recommended for her to have repeat CT scan of the head, chest, abdomen and pelvis performed this week and if no evidence for disease progression she will resume her treatment next week. For the constipation, she will continue with Senokot and stool softener. The patient will continue on the ferrous sulfate for the anemia. The patient was advised to call immediately if she has any other concerning symptoms in the interval. The patient voices understanding of current disease status and treatment options and is in agreement with the current care plan.  All questions were answered. The patient knows to call the clinic with any problems, questions or concerns. We can certainly see the patient much sooner if necessary.  The total time spent in the appointment was 30 minutes.  Disclaimer: This note was dictated with voice recognition software. Similar sounding words can inadvertently be transcribed and may not be corrected upon review.

## 2022-08-20 NOTE — Addendum Note (Signed)
Addended by: Charma Igo on: 08/20/2022 09:46 AM   Modules accepted: Orders

## 2022-08-20 NOTE — Addendum Note (Signed)
Addended by: Charma Igo on: 08/20/2022 10:24 AM   Modules accepted: Orders

## 2022-08-20 NOTE — Patient Instructions (Signed)

## 2022-08-20 NOTE — Progress Notes (Signed)
Nutrition Follow-up:  Patient with stage IV primary non-small cell carcinoma of right lung with extensive bone metastasis. She is receiving maintenance Imfinzi q28d.   7/15-7/18 admission - diverticulitis  Met with pt and daughter in office. Treatment held today. Pt reports increased fatigue and weakness. Pt with heavy eyes and "feeling sleepy" during visit. Pt reports appetite is poor with ongoing altered taste. Noted white coating on tongue today which I suspect is thrush. MD made aware of this. Daughter of pt reports she had a medication for this, however it was discontinued per PCP last week. Daughter reports "it is a fight" to get her to eat at times. She did eat well for breakfast over the weekend. Pt enjoyed 4 pieces of bacon and eggs. Daughter states pt typically only wants hot dogs or fast food. She is drinking Ensure on occasion. Daughter reports one Ensure in the last few days. Pt denies nausea, vomiting. Daughter endorses loose stools since returning home as well as episodes of diarrhea yesterday. She is taking 3 stool softeners and Linzess. Daughter has stopped giving senna. Daughter managing pt medications secondary to cognitive decline.    Medications: klor-con, diflucan (8/6)  Labs: K 3.1  Anthropometrics: Wt 150 lb 12.8 oz today decreased 6% in 4 weeks - severe for time frame  7/9 - 160 lb 11.2 6/12 - 169 lb 6 oz   NUTRITION DIAGNOSIS: Unintended wt loss continues    INTERVENTION:  Reviewed strategies for increasing calories and protein with small frequent meals/snacks, eating by the clock q2h Encouraged starting with Ensure Complete daily and increasing to 3/day as tolerated - coupons provided + additional samples of CIB powder Reviewed tips for altered taste. Suggested trying the baking soda salt water rinses - additional handout with recipe given Klor-con + diflucan sent to pt pharmacy per MD (daughter aware)    MONITORING, EVALUATION, GOAL: weight trends,  intake   NEXT VISIT: Tuesday September 3 during infusion

## 2022-08-22 ENCOUNTER — Ambulatory Visit (HOSPITAL_COMMUNITY)
Admission: RE | Admit: 2022-08-22 | Discharge: 2022-08-22 | Disposition: A | Discharge status: Home / Self Care | Payer: Medicare Other | Source: Ambulatory Visit | Attending: Internal Medicine | Admitting: Internal Medicine

## 2022-08-22 ENCOUNTER — Other Ambulatory Visit: Payer: Self-pay | Admitting: Internal Medicine

## 2022-08-22 ENCOUNTER — Telehealth: Payer: Self-pay | Admitting: Medical Oncology

## 2022-08-22 DIAGNOSIS — C3491 Malignant neoplasm of unspecified part of right bronchus or lung: Secondary | ICD-10-CM

## 2022-08-22 DIAGNOSIS — C3490 Malignant neoplasm of unspecified part of unspecified bronchus or lung: Secondary | ICD-10-CM | POA: Insufficient documentation

## 2022-08-22 DIAGNOSIS — C7951 Secondary malignant neoplasm of bone: Secondary | ICD-10-CM | POA: Insufficient documentation

## 2022-08-22 DIAGNOSIS — C7931 Secondary malignant neoplasm of brain: Secondary | ICD-10-CM | POA: Insufficient documentation

## 2022-08-22 DIAGNOSIS — M4316 Spondylolisthesis, lumbar region: Secondary | ICD-10-CM | POA: Insufficient documentation

## 2022-08-22 DIAGNOSIS — M48061 Spinal stenosis, lumbar region without neurogenic claudication: Secondary | ICD-10-CM | POA: Insufficient documentation

## 2022-08-22 MED ORDER — IOHEXOL 300 MG/ML  SOLN
100.0000 mL | Freq: Once | INTRAMUSCULAR | Status: AC | PRN
  Administered 2022-08-22: 100 mL via INTRAVENOUS

## 2022-08-22 MED ORDER — DEXAMETHASONE 4 MG PO TABS: 4.0000 mg | ORAL_TABLET | Freq: Three times a day (TID) | ORAL | 0 refills | Status: DC

## 2022-08-22 NOTE — Telephone Encounter (Signed)
Dtr notified that per Dr. Arbutus Ped "Please let her know that I referred her to Rad Onc for brain lesions and I did send Decadron to her pharmacy to start today. Thank you".

## 2022-08-26 ENCOUNTER — Telehealth: Payer: Self-pay | Admitting: Internal Medicine

## 2022-08-26 NOTE — Telephone Encounter (Signed)
Called and spoke with patient's daughter regarding 08/13 appointments, patient will be notified.

## 2022-08-26 NOTE — Progress Notes (Signed)
Thoracic Location of Tumor / Histology:  CT CHEST ABDOMEN PELVIS W CONTRAST 08/22/2022  IMPRESSION: 1. Enlargement of the subpleural right lower lobe nodule 1.4 by 1.1 cm, previously 1.0 by 0.9 cm on 07/12/2022. Subpleural nodularity more medially in the right lower lobe measures 1.0 by 0.8 cm, formerly 0.9 by 0.7 cm. 2. Reduce conspicuity of the patchy multifocal osseous metastatic lesions representing osseous metastatic disease shown on prior exams. 3. Density anterior to the left hip is thought to be from iliopsoas bursitis rather than adenopathy. Patient has a left total hip prosthesis. 4. central narrowing of the thecal sac at L4-5 primarily related to degenerative anterolisthesis. 5. Coronary, systemic, and aortic atherosclerosis.  Patient presented with symptoms of:   Biopsies revealed:  11-21-21 FINAL MICROSCOPIC DIAGNOSIS:   A. BRONCHUS, RIGHT INTERMEDIUS, BIOPSY:  - Small cell carcinoma.  See comment.   B. LUNG, RIGHT UPPER LOBE, BIOPSY:  - Small cell carcinoma.  See comment.   COMMENT:   Block A is submitted for immunostaining.  Patchy weak positivity for  synaptophysin, punctate positivity for cytokeratin 7, and strong diffuse  positivity for TTF-1 support the diagnosis.  Ki-67 immunostaining shows  a high proliferation index.  The morphologic findings in block B are  consistent with those in block A.  Dr. Unknown Foley has reviewed an HE  stained slide of block A and concurs with the diagnosis.   Tobacco/Marijuana/Snuff/ETOH use: yes, current smoker per EMR  Past/Anticipated interventions by cardiothoracic surgery, if any:  [name] Milch, MD 11/21/2021  Procedure(s): Flexible bronchoscopy [id number] Brushing (60454) of the bronchus intermedius Bronchial alveolar lavage (09811) of the right middle and lower lobes Endobronchial biopsy (91478) of the mass and bronchus intermedius   Endobronchial ultrasound (29562) Transbronchial needle aspiration (13086) of the 10 R  station     Indications: Right infrahilar mass with mediastinal lymphadenopathy.  Past/Anticipated interventions by medical oncology, if any:  Si Gaul, MD 08/20/2022  DIAGNOSIS: Extensive stage (T4, N2, M1 C) small cell lung cancer presented with large right lower lobe consolidative mass in addition to right hilar and mediastinal lymphadenopathy and extensive bone metastasis diagnosed in November 2023.   ASSESSMENT AND PLAN: This is a very pleasant 72 years old white female with Extensive stage (T4, N2, M1 C) small cell lung cancer presented with large right lower lobe consolidative mass in addition to right hilar and mediastinal lymphadenopathy and extensive bone metastasis diagnosed in November 2023.  The patient is currently undergoing systemic chemotherapy with Palliative systemic chemotherapy with carboplatin for AUC of 5 on day 1, etoposide 100 Mg/M2 on days 1, 2 and 3 with Cosela 240 Mg/M2 on the days of the chemotherapy as well as Imfinzi 1500 mg IV on day 1 every 3 weeks during the induction phase and then every 4 weeks during the maintenance if there is no evidence for disease progression.  Status post 9 cycles.  Starting from cycle #5 she will be on maintenance treatment with single agent Imfinzi 1500 Mg IV every 4 weeks.  Starting from cycle #5 she is on maintenance treatment with Imfinzi 1500 Mg IV every 4 weeks. The patient has been tolerating this treatment well with no concerning adverse effects. She has a lot of complaints recently including fatigue, confusion, nausea as well as weakness. I recommended for her to hold her treatment with cycle #10 today until we have repeat imaging studies and evaluate her disease. I recommended for her to have repeat CT scan of the head, chest, abdomen and pelvis  performed this week and if no evidence for disease progression she will resume her treatment next week.   Signs/Symptoms Weight changes, if any: {:18581} Respiratory complaints,  if any: {:18581} Hemoptysis, if any: {:18581} Pain issues, if any:  {:18581}  SAFETY ISSUES: Prior radiation? {:18581} Pacemaker/ICD? {:18581}  Possible current pregnancy?no Is the patient on methotrexate? no  Current Complaints / other details:  pt had port placed on 01-17-22.

## 2022-08-27 ENCOUNTER — Inpatient Hospital Stay: Payer: Medicare Other

## 2022-08-27 ENCOUNTER — Inpatient Hospital Stay: Payer: Medicare Other | Admitting: Internal Medicine

## 2022-08-27 ENCOUNTER — Other Ambulatory Visit: Payer: Self-pay

## 2022-08-27 ENCOUNTER — Inpatient Hospital Stay: Payer: Medicare Other | Admitting: Physician Assistant

## 2022-08-27 ENCOUNTER — Telehealth: Payer: Self-pay

## 2022-08-27 DIAGNOSIS — C7931 Secondary malignant neoplasm of brain: Secondary | ICD-10-CM | POA: Diagnosis not present

## 2022-08-27 DIAGNOSIS — C3491 Malignant neoplasm of unspecified part of right bronchus or lung: Secondary | ICD-10-CM | POA: Diagnosis not present

## 2022-08-27 DIAGNOSIS — Z95828 Presence of other vascular implants and grafts: Secondary | ICD-10-CM

## 2022-08-27 DIAGNOSIS — D6481 Anemia due to antineoplastic chemotherapy: Secondary | ICD-10-CM

## 2022-08-27 LAB — CBC WITH DIFFERENTIAL (CANCER CENTER ONLY)
Abs Immature Granulocytes: 0.02 10*3/uL (ref 0.00–0.07)
Basophils Absolute: 0 10*3/uL (ref 0.0–0.1)
Basophils Relative: 0 %
Eosinophils Absolute: 0 10*3/uL (ref 0.0–0.5)
Eosinophils Relative: 0 %
HCT: 32.9 % — ABNORMAL LOW (ref 36.0–46.0)
Hemoglobin: 10.9 g/dL — ABNORMAL LOW (ref 12.0–15.0)
Immature Granulocytes: 0 %
Lymphocytes Relative: 7 %
Lymphs Abs: 0.6 10*3/uL — ABNORMAL LOW (ref 0.7–4.0)
MCH: 28.8 pg (ref 26.0–34.0)
MCHC: 33.1 g/dL (ref 30.0–36.0)
MCV: 86.8 fL (ref 80.0–100.0)
Monocytes Absolute: 0.5 10*3/uL (ref 0.1–1.0)
Monocytes Relative: 6 %
Neutro Abs: 7.4 10*3/uL (ref 1.7–7.7)
Neutrophils Relative %: 87 %
Platelet Count: 214 10*3/uL (ref 150–400)
RBC: 3.79 MIL/uL — ABNORMAL LOW (ref 3.87–5.11)
RDW: 15.3 % (ref 11.5–15.5)
WBC Count: 8.5 10*3/uL (ref 4.0–10.5)
nRBC: 0 % (ref 0.0–0.2)

## 2022-08-27 LAB — CMP (CANCER CENTER ONLY)
ALT: 69 U/L — ABNORMAL HIGH (ref 0–44)
AST: 60 U/L — ABNORMAL HIGH (ref 15–41)
Albumin: 4 g/dL (ref 3.5–5.0)
Alkaline Phosphatase: 60 U/L (ref 38–126)
Anion gap: 6 (ref 5–15)
BUN: 29 mg/dL — ABNORMAL HIGH (ref 8–23)
CO2: 27 mmol/L (ref 22–32)
Calcium: 9.3 mg/dL (ref 8.9–10.3)
Chloride: 98 mmol/L (ref 98–111)
Creatinine: 0.88 mg/dL (ref 0.44–1.00)
GFR, Estimated: >60 mL/min (ref >60)
Glucose, Bld: 101 mg/dL — ABNORMAL HIGH (ref 70–99)
Potassium: 3.8 mmol/L (ref 3.5–5.1)
Sodium: 131 mmol/L — ABNORMAL LOW (ref 135–145)
Total Bilirubin: 0.3 mg/dL (ref 0.3–1.2)
Total Protein: 6.9 g/dL (ref 6.5–8.1)

## 2022-08-27 LAB — SAMPLE TO BLOOD BANK

## 2022-08-27 MED ORDER — SODIUM CHLORIDE 0.9% FLUSH
10.0000 mL | Freq: Once | INTRAVENOUS | Status: AC
  Administered 2022-08-27: 10 mL

## 2022-08-27 NOTE — Telephone Encounter (Signed)
Rn left message for pt in attempt to obtain pre consult information. Message left included call back information.

## 2022-08-27 NOTE — Patient Instructions (Signed)

## 2022-08-27 NOTE — Progress Notes (Signed)
[facility] [phone]   [phone]  OFFICE PROGRESS NOTE  [name], NP [address] [place] [address]  DIAGNOSIS: Extensive stage (T4, N2, M1 C) small cell lung cancer presented with large right lower lobe consolidative mass in addition to right hilar and mediastinal lymphadenopathy and extensive bone metastasis diagnosed in November 2023.   PRIOR THERAPY: Palliative systemic chemotherapy with carboplatin for AUC of 5 on day 1, etoposide 100 Mg/M2 on days 1, 2 and 3 with Cosela 240 Mg/M2 on the days of the chemotherapy as well as Imfinzi 1500 mg IV on day 1 every 3 weeks during the induction phase and then every 4 weeks during the maintenance if there is no evidence for disease progression.  Status post 9 cycles.  Starting from cycle #5 she will be on maintenance treatment with Imfinzi 1500 Mg IV every 4 weeks.  Last dose was given on 07/23/2022 discontinued secondary to disease progression  CURRENT THERAPY: Whole brain irradiation for multiple metastatic brain lesion diagnosed in August 2024 and that the care of Dr. Roselind Messier  INTERVAL HISTORY: Delita Dabdoub Sobh 72 y.o. female returns to the clinic today for follow-up visit accompanied by her husband, daughter and sister-in-law.  The patient is feeling a little bit better today after starting Decadron for the vasogenic edema.  She had confusion last week and I order CT of the head with and without contrast in addition to CT scan of the chest, abdomen and pelvis for restaging of her disease.  Unfortunately CT of the head showed multiple brain metastasis with vasogenic edema and the patient started Decadron 4 mg p.o. 3 times daily.  She was referred to radiation oncology and expected to see Dr. Roselind Messier tomorrow for discussion of her whole brain radiation.  She denied having any current chest pain, shortness of breath, cough or hemoptysis.  She has no nausea, vomiting, diarrhea or constipation.  She has no  fever or chills.  She is here today for evaluation and discussion of her treatment options based on the recent imaging studies.  MEDICAL HISTORY: Past Medical History:  Diagnosis Date   Anxiety    Aortic atherosclerosis (HCC)    Bilateral cataracts    Carpal tunnel syndrome    Cervical cancer (HCC)    Chronic back pain    Claudication (HCC)    COPD (chronic obstructive pulmonary disease) (HCC)    wheezing   DDD (degenerative disc disease), cervical    DDD (degenerative disc disease), lumbar    Depression    Diverticulosis    Dyspnea    on exertion   Gastroparesis    GERD (gastroesophageal reflux disease)    Hearing loss    Bilateral   History of blood transfusion    History of colon polyps    History of migraine    History of radiation therapy    Left scapula,left chest, right pelvis- 12/20/21-01/02/22- Dr. 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  MEDICATIONS:  Current Outpatient Medications  Medication Sig Dispense Refill   acetaminophen (TYLENOL) 325 MG tablet Take 650 mg by mouth 2 (two) times daily as needed for headache or mild pain.     albuterol (PROVENTIL) (2.5 MG/3ML) 0.083% nebulizer solution Inhale 3 mLs into the lungs every 4 (four) hours as needed for  wheezing or shortness of breath (cough). (Patient taking differently: Inhale 3 mLs into the lungs 2 (two) times daily as needed for wheezing or shortness of breath (cough).) 75 mL 12   albuterol (VENTOLIN HFA) 108 (90 Base) MCG/ACT inhaler Inhale 2 puffs into the lungs 2 (two) times daily as needed for wheezing or shortness of breath.     aspirin EC (ASPIRIN 81) 81 MG tablet Take 1 tablet (81 mg total) by mouth daily. 90 tablet 3   busPIRone (BUSPAR) 5 MG tablet Take 1 tablet (5 mg total) by mouth 2 (two) times daily. 60 tablet 0   dexamethasone (DECADRON) 4 MG tablet Take 1 tablet (4 mg total) by mouth 3 (three) times daily. 45 tablet 0   DULoxetine (CYMBALTA) 60 MG capsule Take 2 capsules (120 mg total) by mouth daily. (Patient taking differently: Take 60 mg by mouth in the morning and at bedtime.) 60 capsule 3   fluconazole (DIFLUCAN) 100 MG tablet Take 1 tablet (100 mg total) by mouth daily. 10 tablet 0   hydrocortisone (ANUSOL-HC) 25 MG suppository Place 1 suppository (25 mg total) rectally 2 (two) times daily as needed for hemorrhoids or anal itching. 12 suppository 0   lidocaine-prilocaine (EMLA) cream Apply 30 minutes before appointment 30 g 2   linaclotide (LINZESS) 290 MCG CAPS capsule Take 1 capsule (290 mcg total) by mouth daily before breakfast. 30 capsule 0   losartan (COZAAR) 25 MG tablet Take 25 mg by mouth daily.     metoprolol succinate (TOPROL-XL) 25 MG 24 hr tablet TAKE 1 TABLET BY MOUTH DAILY. TAKE WITH OR IMMEDIATELY FOLLOWING A MEAL. 90 tablet 3   nitroGLYCERIN (NITROSTAT) 0.4 MG SL tablet Place 1 tablet (0.4 mg total) under the tongue every 5 (five) minutes x 3 doses as needed for chest pain. (Patient not taking: Reported on 08/20/2022) 25 tablet 1   Nutritional Supplements (ENSURE ORIGINAL) LIQD Take by mouth.     ondansetron (ZOFRAN) 8 MG tablet Take 1 tablet (8 mg total) by mouth every 8 (eight) hours as needed for nausea or vomiting. 30 tablet 2   OVER THE COUNTER MEDICATION  Take 2 mg by mouth daily.     Oxycodone HCl 10 MG TABS Take 1 tablet (10 mg total) by mouth every 4 hours as needed for pain 180 tablet 0   pantoprazole (PROTONIX) 40 MG tablet Take 1 tablet (40 mg total) by mouth daily as needed (acid reflux). 30 tablet 2   polyethylene glycol (MIRALAX) 17 g packet Take 17 g by mouth 2 (two) times daily. 60 each 0   potassium chloride SA (KLOR-CON M) 20 MEQ tablet Take 1 tablet (20 mEq total) by mouth daily. (Patient not taking:  Reported on 08/20/2022) 7 tablet 0   prochlorperazine (COMPAZINE) 10 MG tablet Take 1 tablet (10 mg total) by mouth every 6 (six) hours as needed for nausea or vomiting. (Patient not taking: Reported on 08/20/2022) 30 tablet 2   rOPINIRole (REQUIP) 1 MG tablet Take 1 tablet (1 mg total) by mouth 3 (three) times daily as directed (Patient taking differently: Take 1-2 mg by mouth See admin instructions. Take 1 mg by mouth in the morning and 2 mg at bedtime) 270 tablet 1   rosuvastatin (CRESTOR) 10 MG tablet Take 10 mg by mouth at bedtime.     senna (SENOKOT) 8.6 MG tablet Take 1 tablet (8.6 mg total) by mouth daily. 30 tablet 0   Tiotropium Bromide-Olodaterol (STIOLTO RESPIMAT) 2.5-2.5 MCG/ACT AERS Inhale 2 puffs into the lungs daily. (Patient taking differently: Inhale 2 puffs into the lungs daily as needed (for respiratory flares).) 1 each 5   No current facility-administered medications for this visit.    SURGICAL HISTORY:  Past Surgical History:  Procedure Laterality Date   BACK SURGERY     BIOPSY  08/31/2021   Procedure: BIOPSY;  Surgeon: Tressia Danas, MD;  Location: [facility] ENDOSCOPY;  Service: Gastroenterology;;   BLEPHAROPLASTY  10/10/2017   BRONCHIAL BIOPSY  11/21/2021   Procedure: BRONCHIAL BIOPSIES;  Surgeon: Oretha Milch, MD;  Location: [facility] ENDOSCOPY;  Service: Cardiopulmonary;;   BRONCHIAL BRUSHINGS  11/21/2021   Procedure: BRONCHIAL BRUSHINGS;  Surgeon: Oretha Milch, MD;  Location: [facility] ENDOSCOPY;  Service: Cardiopulmonary;;    BRONCHIAL NEEDLE ASPIRATION BIOPSY  11/21/2021   Procedure: BRONCHIAL NEEDLE ASPIRATION BIOPSIES;  Surgeon: Oretha Milch, MD;  Location: [facility] ENDOSCOPY;  Service: Cardiopulmonary;;   BRONCHIAL WASHINGS  11/21/2021   Procedure: BRONCHIAL WASHINGS;  Surgeon: Oretha Milch, MD;  Location: [facility] ENDOSCOPY;  Service: Cardiopulmonary;;   CATARACT EXTRACTION, BILATERAL     CESAREAN SECTION     CHOLECYSTECTOMY     COLONOSCOPY  10/2016   COLONOSCOPY WITH PROPOFOL N/A 08/31/2021   Procedure: COLONOSCOPY WITH PROPOFOL;  Surgeon: Tressia Danas, MD;  Location: [facility] ENDOSCOPY;  Service: Gastroenterology;  Laterality: N/A;   ESOPHAGOGASTRODUODENOSCOPY (EGD) WITH PROPOFOL N/A 08/31/2021   Procedure: ESOPHAGOGASTRODUODENOSCOPY (EGD) WITH PROPOFOL;  Surgeon: Tressia Danas, MD;  Location: [facility] ENDOSCOPY;  Service: Gastroenterology;  Laterality: N/A;   HAND SURGERY Bilateral    carpal tunnel   HEMORRHOID SURGERY     HEMOSTASIS CONTROL  11/21/2021   Procedure: HEMOSTASIS CONTROL;  Surgeon: Oretha Milch, MD;  Location: [facility] ENDOSCOPY;  Service: Cardiopulmonary;;   INTERSTIM IMPLANT PLACEMENT     IR IMAGING GUIDED PORT INSERTION  01/17/2022   KNEE SURGERY Left    arthroscopy x3   LOWER EXTREMITY ANGIOGRAPHY N/A 07/16/2016   Procedure: Lower Extremity Angiography;  Surgeon: Yates Decamp, MD;  Location: [facility] INVASIVE CV LAB;  Service: Cardiovascular;  Laterality: N/A;   LOWER EXTREMITY INTERVENTION N/A 08/06/2016   Procedure: Lower Extremity Intervention;  Surgeon: Yates Decamp, MD;  Location: [facility] INVASIVE CV LAB;  Service: Cardiovascular;  Laterality: N/A;   NECK SURGERY     OTHER SURGICAL HISTORY  2013   bladder stimulator in back   PERIPHERAL VASCULAR BALLOON ANGIOPLASTY  08/06/2016   Procedure: Peripheral Vascular Balloon Angioplasty;  Surgeon: Yates Decamp, MD;  Location: [facility] INVASIVE CV LAB;  Service: Cardiovascular;;  Right SFA   POLYPECTOMY  08/31/2021   Procedure: POLYPECTOMY;  Surgeon: Tressia Danas, MD;  Location:  [facility] ENDOSCOPY;  Service: Gastroenterology;;   TENNIS ELBOW RELEASE/NIRSCHEL PROCEDURE Right 12/2015  TOE SURGERY Right    right foot second and fourth toe   TOTAL HIP ARTHROPLASTY Left    TOTAL HIP REVISION Left 10/13/2017   Procedure: LEFT TOTAL HIP REVISION ACETABULUM, OPEN REDUCTION INTERNAL WITH BONE GRAFT FIXATION LEFT GREATER TROCHANTERIC FRACTURE;  Surgeon: Gean Birchwood, MD;  Location: [facility] ORS;  Service: Orthopedics;  Laterality: Left;   TOTAL KNEE ARTHROPLASTY Right 01/12/2016   Procedure: RIGHT TOTAL KNEE ARTHROPLASTY;  Surgeon: Beverely Low, MD;  Location[facility] OR;  Service: Orthopedics;  Laterality: Right;   TUBAL LIGATION     UPPER GI ENDOSCOPY  10/2016   VAGINAL HYSTERECTOMY     VIDEO BRONCHOSCOPY WITH ENDOBRONCHIAL ULTRASOUND N/A 11/21/2021   Procedure: VIDEO BRONCHOSCOPY WITH ENDOBRONCHIAL ULTRASOUND;  Surgeon: Oretha Milch, MD;  Location: [facility] ENDOSCOPY;  Service: Cardiopulmonary;  Laterality: N/A;    REVIEW OF SYSTEMS:  Constitutional: positive for fatigue Eyes: negative Ears, nose, mouth, throat, and face: negative Respiratory: negative Cardiovascular: negative Gastrointestinal: negative Genitourinary:negative Integument/breast: negative Hematologic/lymphatic: negative Musculoskeletal:positive for muscle weakness Neurological: positive for dizziness and headaches Behavioral/Psych: negative Endocrine: negative Allergic/Immunologic: negative   PHYSICAL EXAMINATION: General appearance: alert, cooperative, fatigued, and no distress Head: Normocephalic, without obvious abnormality, atraumatic Neck: no adenopathy, no JVD, supple, symmetrical, trachea midline, and thyroid not enlarged, symmetric, no tenderness/mass/nodules Lymph nodes: Cervical, supraclavicular, and axillary nodes normal. Resp: clear to auscultation bilaterally Back: symmetric, no curvature. ROM normal. No CVA tenderness. Cardio: regular rate and rhythm, S1, S2 normal, no murmur, click, rub or gallop GI:  soft, non-tender; bowel sounds normal; no masses,  no organomegaly Extremities: extremities normal, atraumatic, no cyanosis or edema Neurologic: Alert and oriented X 3, normal strength and tone. Normal symmetric reflexes. Normal coordination and gait  ECOG PERFORMANCE STATUS: 1 - Symptomatic but completely ambulatory  Blood pressure (!) 157/64, pulse 62, temperature (!) 97.5 F (36.4 C), temperature source Oral, resp. rate 17, height 5' (1.524 m), weight 151 lb (68.5 kg), SpO2 100%.  LABORATORY DATA: Lab Results  Component Value Date   WBC 4.5 08/20/2022   HGB 9.6 (L) 08/20/2022   HCT 29.8 (L) 08/20/2022   MCV 89.8 08/20/2022   PLT 145 (L) 08/20/2022      Chemistry      Component Value Date/Time   NA 136 08/20/2022 0842   NA 140 09/28/2019 0948   K 3.1 (L) 08/20/2022 0842   K 3.5 03/19/2011 0722   CL 100 08/20/2022 0842   CO2 29 08/20/2022 0842   BUN 10 08/20/2022 0842   BUN 12 09/28/2019 0948   CREATININE 0.76 08/20/2022 0842      Component Value Date/Time   CALCIUM 9.0 08/20/2022 0842   ALKPHOS 67 08/20/2022 0842   AST 17 08/20/2022 0842   ALT 12 08/20/2022 0842   BILITOT 0.4 08/20/2022 0842       RADIOGRAPHIC STUDIES: CT HEAD W & WO CONTRAST ( )  Addendum Date: 08/22/2022   ADDENDUM REPORT: 08/22/2022 12:20 ADDENDUM: Study discussed by telephone with Dr. Si Gaul on 08/22/2022 at 12:20 . Electronically Signed   By: Odessa Fleming M.D.   On: 08/22/2022 12:20   Result Date: 08/22/2022 CLINICAL DATA:  72 year old female with history of lung cancer, small cell. Restaging. Skeletal metastases. EXAM: CT HEAD WITHOUT AND WITH CONTRAST TECHNIQUE: Contiguous axial images were obtained from the base of the skull through the vertex without and with intravenous contrast. RADIATION DOSE REDUCTION: This exam was performed according to the departmental dose-optimization program which includes automated exposure control, adjustment of the  mA and/or kV according to patient size and/or  use of iterative reconstruction technique. CONTRAST:  OMNIPAQUE IOHEXOL 300 MG/ML  SOLN COMPARISON:  Brain MRI without and with contrast 01/15/2022. Head CT 08/29/2021. FINDINGS: Brain: New enhancing brain metastases since the January MRI. At least 8 enhancing brain metastases are now identified by CT, scattered in both cerebral and cerebellar hemispheres. Notably, there is a lobulated and oval up to 14 mm metastasis of the quadrigeminal plate (series 4, image 13 and coronal image 6). And there is evidence of aqueductal stenosis with increased lateral and 3rd ventricular size since January. Transependymal edema is possible. Otherwise the largest metastasis is 17 mm in the left occipital lobe. Basilar cisterns remain patent. Cerebellar and cerebral edema. No midline shift. Fourth ventricle appears normal. No acute intracranial hemorrhage identified. Vascular: Calcified atherosclerosis at the skull base. Major intracranial vascular structures are enhancing as expected. Skull: Circumscribed but new osteolytic lesion at the left vertex since 08/29/2021, most compatible with metastasis there, up to 2.2 cm long axis (coronal image 51). No pathologic fracture. Smaller roughly 1 cm similar new lytic lesion of the right anterior frontal bone series 3, image 47. No other discrete skull metastasis by CT. Sinuses/Orbits: Visualized paranasal sinuses and mastoids are clear. Other: No acute orbit or scalp soft tissue finding. IMPRESSION: 1. Positive for multiple new brain metastases: At least eight metastases now, including a 14 mm metastasis of the quadrigeminal plate with evidence of aqueductal stenosis, lateral and 3rd ventriculomegaly. Some transependymal edema is possible, and there is some cerebral and cerebellar vasogenic edema. But no midline shift and basilar cisterns remain patent. 2. Progressed skull metastases since the CT last year. Electronically Signed: By: Odessa Fleming M.D. On: 08/22/2022 12:14   CT CHEST  ABDOMEN PELVIS W CONTRAST  Result Date: 08/22/2022 CLINICAL DATA:  Restaging small cell lung cancer * Tracking Code: BO * EXAM: CT CHEST, ABDOMEN, AND PELVIS WITH CONTRAST TECHNIQUE: Multidetector CT imaging of the chest, abdomen and pelvis was performed following the standard protocol during bolus administration of intravenous contrast. RADIATION DOSE REDUCTION: This exam was performed according to the departmental dose-optimization program which includes automated exposure control, adjustment of the mA and/or kV according to patient size and/or use of iterative reconstruction technique. CONTRAST:  OMNIPAQUE IOHEXOL 300 MG/ML  SOLN COMPARISON:  07/12/2022 and 07/29/2022 FINDINGS: CT CHEST FINDINGS Cardiovascular: Right IJ Port-A-Cath tip: Upper right atrium. Coronary, aortic arch, and branch vessel atherosclerotic vascular disease. Mild cardiomegaly. Mediastinum/Nodes: Unremarkable Lungs/Pleura: 3 mm right upper lobe subpleural nodule, image 85 series 4, stable. Calcified 4 mm right middle lobe nodule on image 96 series 4, stable. 4 mm inferior right middle lobe nodule on image 108 series 4, stable. Enlargement of the subpleural right lower lobe nodule 1.4 by 1.1 cm on image 104 series 4, previously 1.0 by 0.9 cm on 07/12/2022. This has some increased peribronchovascular extension. Subpleural nodularity more medially in the right lower lobe measures 1.0 by 0.8 cm on image 98 series 4, formerly 0.9 by 0.7 cm. Musculoskeletal: Lower cervical plate and screw fixator. Thoracic spondylosis. Reduce conspicuity of the patchy multifocal osseous sclerotic lesions representing osseous metastatic disease shown on prior exams. Continued large Schmorl's nodes versus mild compression deformities along the endplates of L5 with associated sclerosis. CT ABDOMEN PELVIS FINDINGS Hepatobiliary: Stable mild extrahepatic biliary dilatation with CBD at 0.9 cm, likely a physiologic response to cholecystectomy. No hepatic  parenchymal lesion identified. Pancreas: Unremarkable Spleen: Unremarkable Adrenals/Urinary Tract: Unremarkable Stomach/Bowel: Mild sigmoid colon  diverticulosis. Previous rectal stool ball has passed. Previous perirectal stranding no longer appreciable. Vascular/Lymphatic: Atherosclerosis is present, including aortoiliac atherosclerotic disease. 2.3 by 3.6 cm structure along the deep margin of the left iliac vessels at the level of the groin is thought to be from iliopsoas bursitis rather than adenopathy. Hard and soft plaque proximally in the superior mesenteric artery causing notable stenosis but without overt occlusion. Reproductive: Uterus absent.  Adnexa unremarkable. Other: No supplemental non-categorized findings. Musculoskeletal: As noted above there is suspected left iliopsoas bursitis. Left InterStim device noted. Left total hip prosthesis noted. Previous scattered osseous metastatic lesions in the lumbar spine and bony pelvis are relatively inconspicuous today. Grade 1 degenerative anterolisthesis at L4-5 contributing to central narrowing of the thecal sac. IMPRESSION: 1. Enlargement of the subpleural right lower lobe nodule 1.4 by 1.1 cm, previously 1.0 by 0.9 cm on 07/12/2022. Subpleural nodularity more medially in the right lower lobe measures 1.0 by 0.8 cm, formerly 0.9 by 0.7 cm. 2. Reduce conspicuity of the patchy multifocal osseous metastatic lesions representing osseous metastatic disease shown on prior exams. 3. Density anterior to the left hip is thought to be from iliopsoas bursitis rather than adenopathy. Patient has a left total hip prosthesis. 4. central narrowing of the thecal sac at L4-5 primarily related to degenerative anterolisthesis. 5. Coronary, systemic, and aortic atherosclerosis. Aortic Atherosclerosis (ICD10-I70.0). Electronically Signed   By: Gaylyn Rong M.D.   On: 08/22/2022 12:18   CT ABDOMEN PELVIS W CONTRAST  Addendum Date: 07/31/2022   ADDENDUM REPORT: 07/31/2022  14:28 ADDENDUM: Extensive sigmoid colon diverticulosis. Electronically Signed   By: Ted Mcalpine M.D.   On: 07/31/2022 14:28   Result Date: 07/31/2022 CLINICAL DATA:  Left lower quadrant abdominal pain. EXAM: CT ABDOMEN AND PELVIS WITH CONTRAST TECHNIQUE: Multidetector CT imaging of the abdomen and pelvis was performed using the standard protocol following bolus administration of intravenous contrast. RADIATION DOSE REDUCTION: This exam was performed according to the departmental dose-optimization program which includes automated exposure control, adjustment of the mA and/or kV according to patient size and/or use of iterative reconstruction technique. CONTRAST:  OMNIPAQUE IOHEXOL 300 MG/ML  SOLN COMPARISON:  July 12, 2022 FINDINGS: Lower chest: Chronic interstitial lung changes. Previously noted minute pulmonary nodules in the right middle lobe are stable. Hepatobiliary: No focal liver abnormality is seen. Status post cholecystectomy. No biliary dilatation. Pancreas: Unremarkable. No pancreatic ductal dilatation or surrounding inflammatory changes. Spleen: Normal in size without focal abnormality. Adrenals/Urinary Tract: Adrenal glands are unremarkable. Kidneys are normal, without renal calculi, focal lesion, or hydronephrosis. Bladder is unremarkable. Stomach/Bowel: Stomach is within normal limits. Appendix appears normal. No evidence of small bowel wall thickening, distention, or inflammatory changes. Extensive sigmoid colon diverticulitis. Circumferential thickening of the rectum. Vascular/Lymphatic: No significant vascular findings are present. No enlarged abdominal or pelvic lymph nodes. Reproductive: Status post hysterectomy. No adnexal masses. Other: No abdominal wall hernia or abnormality. No abdominopelvic ascites. Musculoskeletal: Stable sclerotic changes in the spine. Stable compression deformity of T12 vertebral body. IMPRESSION: 1. Extensive sigmoid colon diverticulitis. 2.  Circumferential thickening of the rectum. Please correlate with clinical symptoms of proctitis versus internal hemorrhoids. 3. Stable sclerotic changes in the spine. 4. Stable compression deformity of T12 vertebral body. 5. Chronic interstitial lung changes. 6. Previously noted minute pulmonary nodules in the right middle lobe are stable. Electronically Signed: By: Ted Mcalpine M.D. On: 07/29/2022 17:05    ASSESSMENT AND PLAN: This is a very pleasant 72 years old white female with Extensive stage (  T4, N2, M1 C) small cell lung cancer presented with large right lower lobe consolidative mass in addition to right hilar and mediastinal lymphadenopathy and extensive bone metastasis diagnosed in November 2023.  The patient is currently undergoing systemic chemotherapy with Palliative systemic chemotherapy with carboplatin for AUC of 5 on day 1, etoposide 100 Mg/M2 on days 1, 2 and 3 with Cosela 240 Mg/M2 on the days of the chemotherapy as well as Imfinzi 1500 mg IV on day 1 every 3 weeks during the induction phase and then every 4 weeks during the maintenance if there is no evidence for disease progression.  Status post 9 cycles.  Starting from cycle #5 she will be on maintenance treatment with single agent Imfinzi 1500 Mg IV every 4 weeks.  Starting from cycle #5 she is on maintenance treatment with Imfinzi 1500 Mg IV every 4 weeks. The patient has been tolerating this treatment well with no concerning adverse effect except for the recent fatigue and weakness as well as a headache and dizzy spells. She had repeat CT scan of the chest, abdomen and pelvis in addition to CT of the head with and without contrast.  The scan of the head showed multiple new brain metastasis with at least 8 metastasis now including 1.4 cm metastasis of the quadrigeminal plate with evidence of aqua ductal stenosis and lateral and third ventriculomegaly with some transependymal edema and there was some cerebral and cerebellar vasogenic  edema.  There was also progressed to skull metastasis. CT scan of the chest, abdomen and pelvis showed enlargement of his subpleural right lower lobe nodule measuring 1.4 x 1.1 cm compared to 1.0 x 0.9 cm.  There was subpleural nodularity more medially in the right lower lobe measuring 1.0 x 0.8 cm.  There was also reduced conspicuity of the patchy multifocal osseous metastatic lesion representing osseous metastatic disease and density anterior to the left hip thought to be from the iliopsoas bursitis rather than adenopathy. I had a lengthy discussion with the patient and her family about her current condition and treatment options. I strongly recommend for the patient to consider proceeding with whole brain radiation under the care of Dr. Trenton Founds. I will discontinue her current treatment with immunotherapy until completion of the radiotherapy. I will see the patient back for follow-up visit in 3 weeks for evaluation and discussion of her treatment options including palliative care and hospice versus second line systemic chemotherapy with resuming carboplatin and etoposide or Lurbinectedin. The patient will continue with the current dose of Decadron 4 mg p.o. 3 times daily and this will be tapered slowly during her radiotherapy. She was advised to call immediately if she has any other concerning symptoms in the interval. The patient voices understanding of current disease status and treatment options and is in agreement with the current care plan.  All questions were answered. The patient knows to call the clinic with any problems, questions or concerns. We can certainly see the patient much sooner if necessary.  The total time spent in the appointment was 30 minutes.  Disclaimer: This note was dictated with voice recognition software. Similar sounding words can inadvertently be transcribed and may not be corrected upon review.

## 2022-08-27 NOTE — Progress Notes (Signed)
Radiation Oncology         [phone] ________________________________  Outpatient Re-Consultation  Name: [name] MRN: [id number]  Date: 08/28/2022  DOB: 07-Mar-1950  WU:[id number], Raynelle Fanning, NP  Si Gaul, MD   REFERRING PHYSICIAN: Si Gaul, MD  DIAGNOSIS: {There were no encounter diagnoses. 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 A diagnosis of Mediastinal lymphadenopathy was also pertinent to this visit.   Small cell carcinoma of the right upper lobe / right intermedius bronchus, extensive stage. 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 Now with evidence of brain metastases, new skull mets, and progression of disease in the right lung (August 2024)   Interval Since Last Radiation: 6 months and 25 days    Radiation Treatment Dates: 12/20/2021 through 01/02/2022 Site Technique Total Dose (Gy) Dose per Fx (Gy) Completed Fx Beam Energies  Scapula, Left: Chest_L 3D 30/30 3 10/10 6X, 10X  Hip, Right: Pelvis_R 3D 30/30 3 10/10 6X, 10X    HISTORY OF PRESENT ILLNESS::[name] is a 72 y.o. female who is accompanied by ***. she is seen as a courtesy of Dr. 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 She was last seen here on 02/04/22 for one month follow-up of palliative radiation to the osseous metastatic disease in her left scapula and right pelvis.   Since her last visit, the patient continued receiving systemic chemotherapy and immunotherapy consisting of Cosela, Imfinzi, Carboplatin, and Etoposide under the care of Dr. Arbutus Ped. She tolerated chemotherapy treatment relatively well other than chemotherapy induced  anemia and has required multiple blood transfusions. Starting with cycle #5 on 03/20/22, she was transitioned to maintenance treatment with Imfinzi (given q4 weeks). She continues to tolerate this well without any concerning adverse side effects except ongoing fatigue and generalized weakness.    She presented to a restaging CT CAP on 05/24/22 which showed no significant changes overall with stable small areas of lung nodularity and some small mediastinal and hilar lymph nodes noted. Stable diffuse patchy areas of bony sclerosis / osseous disease were also appreciated.   The patient later presented to the ED on 07/12/22 with constipation and diarrhea x 4 days. A repeat CT CAP was subsequently performed which showed stable findings in the right lung other than a new 9 mm subpleural nodule over the posterior right lower lobe. CT also redemonstrated the stable findings noted on her prior CT from 05/10.   Although a new RLL nodule was noted, Dr. Arbutus Ped recommended proceeding with immunotherapy and monitoring the nodule closely.   This past July, the patient began endorsing fatigue, weakness, and nausea. During a follow-up visit with Dr. Arbutus Ped on 08/20/22, the patient reported progression of these symptoms, as well as feeling new lumps in the inguinal area, along with intermittent nausea and some confusion.   In light of her symptoms, Dr. Arbutus Ped recommended repeat imaging and holding cycle 10 of immunotherapy pending work-up.   Subsequent CT CAP on 08/22/22 showed evidence of disease progression, demonstrated by: an increase in size of the subpleural right lower lobe nodule, measuring 1.4 x 1.1 cm (previously 1.0 x 0.9 cm), as well as an increase in size of the subpleural nodularity more medially in the right lower  lobe measuring 1.0 x 0.8 cm (previously) 0.9 by 0.7 cm. CT otherwise showed a decrease in conspicuity of the patchy multifocal osseous metastatic lesions representing osseous metastatic  disease shown on prior exams.   A CT of the head was also performed on 08/08 which unfortunately showed multiple new brain metastases consisting of at least 8 lesions, including a 14 mm metastasis of the quadrigeminal plate, with evidence of aqueductal stenosis, and lateral and 3rd ventriculomegaly. Possible transependymal edema was also demonstrated, along with evidence of cerebral and cerebellar vasogenic edema. No evidence of midline shift was appreciated. Progression of osseous metastases to the skull was also demonstrated, consisting of a new lesion at the left vertex and a new lesion of the right anterior frontal bone.   Of note: The patient was recently hospitalized from 07/29/22 through 08/01/22 with significant diverticulosis. A CT CAP was performed upon presentation which showed findings in keeping with her principal problem. Imaging also showed stability of the smaller right middle lobe pulmonary nodules at that time.     PAST MEDICAL HISTORY:  Past Medical History:  Diagnosis Date   Anxiety    Aortic atherosclerosis (HCC)    Bilateral cataracts    Carpal tunnel syndrome    Cervical cancer (HCC)    Chronic back pain    Claudication (HCC)    COPD (chronic obstructive pulmonary disease) (HCC)    wheezing   DDD (degenerative disc disease), cervical    DDD (degenerative disc disease), lumbar    Depression    Diverticulosis    Dyspnea    on exertion   Gastroparesis    GERD (gastroesophageal reflux disease)    Hearing loss    Bilateral   History of blood transfusion    History of colon polyps    History of migraine    History of radiation therapy    Left scapula,left chest, right pelvis- 12/20/21-01/02/22- Dr. 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   rt   Urge incontinence of urine    Varicose vein of leg    Venous insufficiency     PAST SURGICAL HISTORY: Past Surgical History:  Procedure Laterality Date   BACK SURGERY     BIOPSY  08/31/2021   Procedure: BIOPSY;  Surgeon: Tressia Danas, MD;  Location: [facility] ENDOSCOPY;  Service: Gastroenterology;;   BLEPHAROPLASTY  10/10/2017   BRONCHIAL BIOPSY  11/21/2021   Procedure: BRONCHIAL BIOPSIES;  Surgeon: Oretha Milch, MD;  Location: [facility] ENDOSCOPY;  Service: Cardiopulmonary;;   BRONCHIAL BRUSHINGS  11/21/2021   Procedure: BRONCHIAL BRUSHINGS;  Surgeon: Oretha Milch, MD;  Location: [facility]COPY;  Service: Cardiopulmonary;;   BRONCHIAL NEEDLE ASPIRATION BIOPSY  11/21/2021   Procedure: BRONCHIAL NEEDLE ASPIRATION BIOPSIES;  Surgeon: Oretha Milch, MD;  Location: [facility] ENDOSCOPY;  Service: Cardiopulmonary;;  BRONCHIAL WASHINGS  11/21/2021   Procedure: BRONCHIAL WASHINGS;  Surgeon: Oretha Milch, MD;  Location: [facility] ENDOSCOPY;  Service: Cardiopulmonary;;   CATARACT EXTRACTION, BILATERAL     CESAREAN SECTION     CHOLECYSTECTOMY     COLONOSCOPY  10/2016   COLONOSCOPY WITH PROPOFOL N/A 08/31/2021   Procedure: COLONOSCOPY WITH PROPOFOL;  Surgeon: Tressia Danas, MD;  Location: [facility] ENDOSCOPY;  Service: Gastroenterology;  Laterality: N/A;   ESOPHAGOGASTRODUODENOSCOPY (EGD) WITH PROPOFOL N/A 08/31/2021   Procedure: ESOPHAGOGASTRODUODENOSCOPY (EGD) WITH PROPOFOL;  Surgeon: Tressia Danas, MD;  Location: [facility] ENDOSCOPY;  Service: Gastroenterology;  Laterality: N/A;   HAND SURGERY Bilateral    carpal tunnel   HEMORRHOID SURGERY     HEMOSTASIS CONTROL  11/21/2021   Procedure: HEMOSTASIS CONTROL;  Surgeon: Oretha Milch, MD;  Location: [facility]  ENDOSCOPY;  Service: Cardiopulmonary;;   INTERSTIM IMPLANT PLACEMENT     IR IMAGING GUIDED PORT INSERTION  01/17/2022   KNEE SURGERY Left    arthroscopy x3   LOWER EXTREMITY ANGIOGRAPHY N/A 07/16/2016   Procedure: Lower Extremity Angiography;  Surgeon: Yates Decamp, MD;  Location: [facility] INVASIVE CV LAB;  Service: Cardiovascular;  Laterality: N/A;   LOWER EXTREMITY INTERVENTION N/A 08/06/2016   Procedure: Lower Extremity Intervention;  Surgeon: Yates Decamp, MD;  Location: [facility] INVASIVE CV LAB;  Service: Cardiovascular;  Laterality: N/A;   NECK SURGERY     OTHER SURGICAL HISTORY  2013   bladder stimulator in back   PERIPHERAL VASCULAR BALLOON ANGIOPLASTY  08/06/2016   Procedure: Peripheral Vascular Balloon Angioplasty;  Surgeon: Yates Decamp, MD;  Location: [facility] INVASIVE CV LAB;  Service: Cardiovascular;;  Right SFA   POLYPECTOMY  08/31/2021   Procedure: POLYPECTOMY;  Surgeon: Tressia Danas, MD;  Location: [facility] ENDOSCOPY;  Service: Gastroenterology;;   TENNIS ELBOW RELEASE/NIRSCHEL PROCEDURE Right 12/2015   TOE SURGERY Right    right foot second and fourth toe   TOTAL HIP ARTHROPLASTY Left    TOTAL HIP REVISION Left 10/13/2017   Procedure: LEFT TOTAL HIP REVISION ACETABULUM, OPEN REDUCTION INTERNAL WITH BONE GRAFT FIXATION LEFT GREATER TROCHANTERIC FRACTURE;  Surgeon: Gean Birchwood, MD;  Location: [facility] ORS;  Service: Orthopedics;  Laterality: Left;   TOTAL KNEE ARTHROPLASTY Right 01/12/2016   Procedure: RIGHT TOTAL KNEE ARTHROPLASTY;  Surgeon: Beverely Low, MD;  Location: [facility] OR;  Service: Orthopedics;  Laterality: Right;   TUBAL LIGATION     UPPER GI ENDOSCOPY  10/2016   VAGINAL HYSTERECTOMY     VIDEO BRONCHOSCOPY WITH ENDOBRONCHIAL ULTRASOUND N/A 11/21/2021   Procedure: VIDEO BRONCHOSCOPY WITH ENDOBRONCHIAL ULTRASOUND;  Surgeon: Oretha Milch, MD;  Location: [facility] ENDOSCOPY;  Service: Cardiopulmonary;  Laterality: N/A;    FAMILY HISTORY:  Family History  Problem Relation Age of Onset   Hyperlipidemia Mother     Hypertension Mother    Anxiety disorder Mother    Depression Mother    Hyperlipidemia Father    Hypertension Father    Alzheimer's disease Father    Anxiety disorder Brother    Anxiety disorder Brother    Anxiety disorder Brother    Drug abuse Brother    Stroke Brother    Cancer Daughter        unknown   Colon cancer Neg Hx    Esophageal cancer Neg Hx    Rectal cancer Neg Hx    Stomach cancer Neg Hx     SOCIAL HISTORY:  Social History   Tobacco Use   Smoking status: Every Day    Current packs/day: 0.50  Average packs/day: 0.5 packs/day for 55.0 years (27.5 ttl pk-yrs)    Types: Cigarettes    Passive exposure: Past   Smokeless tobacco: Never   Tobacco comments:    Pt states she smokes a half pack daily.  Trying to quit.  Has patches, afraid to use with chemo/radiation.  Vaping Use   Vaping status: Never Used  Substance Use Topics   Alcohol use: No    Alcohol/week: 0.0 standard drinks of alcohol   Drug use: Never    ALLERGIES:  Allergies  Allergen Reactions   Bee Venom Anaphylaxis   Morphine And Codeine Other (See Comments)    Overly sedated   Neurontin [Gabapentin] Other (See Comments)    Nightmares    Codeine Hives   Adhesive [Tape] Itching and Other (See Comments)    Redness, Paper tape is ok   Sulfa Antibiotics Other (See Comments)    Childhood allergy     MEDICATIONS:  Current Outpatient Medications  Medication Sig Dispense Refill   acetaminophen (TYLENOL) 325 MG tablet Take 650 mg by mouth 2 (two) times daily as needed for headache or mild pain.     albuterol (PROVENTIL) (2.5 MG/3ML) 0.083% nebulizer solution Inhale 3 mLs into the lungs every 4 (four) hours as needed for wheezing or shortness of breath (cough). (Patient taking differently: Inhale 3 mLs into the lungs 2 (two) times daily as needed for wheezing or shortness of breath (cough).) 75 mL 12   albuterol (VENTOLIN HFA) 108 (90 Base) MCG/ACT inhaler Inhale 2 puffs into the lungs 2 (two)  times daily as needed for wheezing or shortness of breath.     aspirin EC (ASPIRIN 81) 81 MG tablet Take 1 tablet (81 mg total) by mouth daily. 90 tablet 3   busPIRone (BUSPAR) 5 MG tablet Take 1 tablet (5 mg total) by mouth 2 (two) times daily. 60 tablet 0   dexamethasone (DECADRON) 4 MG tablet Take 1 tablet (4 mg total) by mouth 3 (three) times daily. 45 tablet 0   DULoxetine (CYMBALTA) 60 MG capsule Take 2 capsules (120 mg total) by mouth daily. (Patient taking differently: Take 60 mg by mouth in the morning and at bedtime.) 60 capsule 3   fluconazole (DIFLUCAN) 100 MG tablet Take 1 tablet (100 mg total) by mouth daily. 10 tablet 0   hydrocortisone (ANUSOL-HC) 25 MG suppository Place 1 suppository (25 mg total) rectally 2 (two) times daily as needed for hemorrhoids or anal itching. 12 suppository 0   lidocaine-prilocaine (EMLA) cream Apply 30 minutes before appointment 30 g 2   linaclotide (LINZESS) 290 MCG CAPS capsule Take 1 capsule (290 mcg total) by mouth daily before breakfast. 30 capsule 0   losartan (COZAAR) 25 MG tablet Take 25 mg by mouth daily.     metoprolol succinate (TOPROL-XL) 25 MG 24 hr tablet TAKE 1 TABLET BY MOUTH DAILY. TAKE WITH OR IMMEDIATELY FOLLOWING A MEAL. 90 tablet 3   nitroGLYCERIN (NITROSTAT) 0.4 MG SL tablet Place 1 tablet (0.4 mg total) under the tongue every 5 (five) minutes x 3 doses as needed for chest pain. (Patient not taking: Reported on 08/20/2022) 25 tablet 1   Nutritional Supplements (ENSURE ORIGINAL) LIQD Take by mouth.     ondansetron (ZOFRAN) 8 MG tablet Take 1 tablet (8 mg total) by mouth every 8 (eight) hours as needed for nausea or vomiting. 30 tablet 2   OVER THE COUNTER MEDICATION Take 2 mg by mouth daily.     Oxycodone HCl 10 MG TABS  Take 1 tablet (10 mg total) by mouth every 4 hours as needed for pain 180 tablet 0   pantoprazole (PROTONIX) 40 MG tablet Take 1 tablet (40 mg total) by mouth daily as needed (acid reflux). 30 tablet 2   polyethylene  glycol (MIRALAX) 17 g packet Take 17 g by mouth 2 (two) times daily. 60 each 0   potassium chloride SA (KLOR-CON M) 20 MEQ tablet Take 1 tablet (20 mEq total) by mouth daily. (Patient not taking: Reported on 08/20/2022) 7 tablet 0   prochlorperazine (COMPAZINE) 10 MG tablet Take 1 tablet (10 mg total) by mouth every 6 (six) hours as needed for nausea or vomiting. (Patient not taking: Reported on 08/20/2022) 30 tablet 2   rOPINIRole (REQUIP) 1 MG tablet Take 1 tablet (1 mg total) by mouth 3 (three) times daily as directed (Patient taking differently: Take 1-2 mg by mouth See admin instructions. Take 1 mg by mouth in the morning and 2 mg at bedtime) 270 tablet 1   rosuvastatin (CRESTOR) 10 MG tablet Take 10 mg by mouth at bedtime.     senna (SENOKOT) 8.6 MG tablet Take 1 tablet (8.6 mg total) by mouth daily. 30 tablet 0   Tiotropium Bromide-Olodaterol (STIOLTO RESPIMAT) 2.5-2.5 MCG/ACT AERS Inhale 2 puffs into the lungs daily. (Patient taking differently: Inhale 2 puffs into the lungs daily as needed (for respiratory flares).) 1 each 5   No current facility-administered medications for this encounter.    REVIEW OF SYSTEMS:  A 10+ POINT REVIEW OF SYSTEMS WAS OBTAINED including neurology, dermatology, psychiatry, cardiac, respiratory, lymph, extremities, GI, GU, musculoskeletal, constitutional, reproductive, HEENT. ***   PHYSICAL EXAM:  vitals were not taken for this visit.   General: Alert and oriented, in no acute distress HEENT: Head is normocephalic. Extraocular movements are intact. Oropharynx is clear. Neck: Neck is supple, no palpable cervical or supraclavicular lymphadenopathy. Heart: Regular in rate and rhythm with no murmurs, rubs, or gallops. Chest: Clear to auscultation bilaterally, with no rhonchi, wheezes, or rales. Abdomen: Soft, nontender, nondistended, with no rigidity or guarding. Extremities: No cyanosis or edema. Lymphatics: see Neck Exam Skin: No concerning  lesions. Musculoskeletal: symmetric strength and muscle tone throughout. Neurologic: Cranial nerves II through XII are grossly intact. No obvious focalities. Speech is fluent. Coordination is intact. Psychiatric: Judgment and insight are intact. Affect is appropriate. ***  ECOG = ***  0 - Asymptomatic (Fully active, able to carry on all predisease activities without restriction)  1 - Symptomatic but completely ambulatory (Restricted in physically strenuous activity but ambulatory and able to carry out work of a light or sedentary nature. For example, light housework, office work)  2 - Symptomatic, <50% in bed during the day (Ambulatory and capable of all self care but unable to carry out any work activities. Up and about more than 50% of waking hours)  3 - Symptomatic, >50% in bed, but not bedbound (Capable of only limited self-care, confined to bed or chair 50% or more of waking hours)  4 - Bedbound (Completely disabled. Cannot carry on any self-care. Totally confined to bed or chair)  5 - Death   Santiago Glad MM, Creech RH, Tormey DC, et al. [phone]). "Toxicity and response criteria of the [facility]". Am. Evlyn Clines. Oncol. 5 (6): 649-55  LABORATORY DATA:  Lab Results  Component Value Date   WBC 8.5 08/27/2022   HGB 10.9 (L) 08/27/2022   HCT 32.9 (L) 08/27/2022   MCV 86.8 08/27/2022   PLT 214  08/27/2022   NEUTROABS 7.4 08/27/2022   Lab Results  Component Value Date   NA 131 (L) 08/27/2022   K 3.8 08/27/2022   CL 98 08/27/2022   CO2 27 08/27/2022   GLUCOSE 101 (H) 08/27/2022   BUN 29 (H) 08/27/2022   CREATININE 0.88 08/27/2022   CALCIUM 9.3 08/27/2022      RADIOGRAPHY: CT HEAD W & WO CONTRAST ( )  Addendum Date: 08/22/2022   ADDENDUM REPORT: 08/22/2022 12:20 ADDENDUM: Study discussed by telephone with Dr. Si Gaul on 08/22/2022 at 12:20 . Electronically Signed   By: Odessa Fleming M.D.   On: 08/22/2022 12:20   Result Date: 08/22/2022 CLINICAL DATA:   72 year old female with history of lung cancer, small cell. Restaging. Skeletal metastases. EXAM: CT HEAD WITHOUT AND WITH CONTRAST TECHNIQUE: Contiguous axial images were obtained from the base of the skull through the vertex without and with intravenous contrast. RADIATION DOSE REDUCTION: This exam was performed according to the departmental dose-optimization program which includes automated exposure control, adjustment of the mA and/or kV according to patient size and/or use of iterative reconstruction technique. CONTRAST:  OMNIPAQUE IOHEXOL 300 MG/ML  SOLN COMPARISON:  Brain MRI without and with contrast 01/15/2022. Head CT 08/29/2021. FINDINGS: Brain: New enhancing brain metastases since the January MRI. At least 8 enhancing brain metastases are now identified by CT, scattered in both cerebral and cerebellar hemispheres. Notably, there is a lobulated and oval up to 14 mm metastasis of the quadrigeminal plate (series 4, image 13 and coronal image 6). And there is evidence of aqueductal stenosis with increased lateral and 3rd ventricular size since January. Transependymal edema is possible. Otherwise the largest metastasis is 17 mm in the left occipital lobe. Basilar cisterns remain patent. Cerebellar and cerebral edema. No midline shift. Fourth ventricle appears normal. No acute intracranial hemorrhage identified. Vascular: Calcified atherosclerosis at the skull base. Major intracranial vascular structures are enhancing as expected. Skull: Circumscribed but new osteolytic lesion at the left vertex since 08/29/2021, most compatible with metastasis there, up to 2.2 cm long axis (coronal image 51). No pathologic fracture. Smaller roughly 1 cm similar new lytic lesion of the right anterior frontal bone series 3, image 47. No other discrete skull metastasis by CT. Sinuses/Orbits: Visualized paranasal sinuses and mastoids are clear. Other: No acute orbit or scalp soft tissue finding. IMPRESSION: 1. Positive for  multiple new brain metastases: At least eight metastases now, including a 14 mm metastasis of the quadrigeminal plate with evidence of aqueductal stenosis, lateral and 3rd ventriculomegaly. Some transependymal edema is possible, and there is some cerebral and cerebellar vasogenic edema. But no midline shift and basilar cisterns remain patent. 2. Progressed skull metastases since the CT last year. Electronically Signed: By: Odessa Fleming M.D. On: 08/22/2022 12:14   CT CHEST ABDOMEN PELVIS W CONTRAST  Result Date: 08/22/2022 CLINICAL DATA:  Restaging small cell lung cancer * Tracking Code: BO * EXAM: CT CHEST, ABDOMEN, AND PELVIS WITH CONTRAST TECHNIQUE: Multidetector CT imaging of the chest, abdomen and pelvis was performed following the standard protocol during bolus administration of intravenous contrast. RADIATION DOSE REDUCTION: This exam was performed according to the departmental dose-optimization program which includes automated exposure control, adjustment of the mA and/or kV according to patient size and/or use of iterative reconstruction technique. CONTRAST:  OMNIPAQUE IOHEXOL 300 MG/ML  SOLN COMPARISON:  07/12/2022 and 07/29/2022 FINDINGS: CT CHEST FINDINGS Cardiovascular: Right IJ Port-A-Cath tip: Upper right atrium. Coronary, aortic arch, and branch vessel atherosclerotic vascular disease. Mild  cardiomegaly. Mediastinum/Nodes: Unremarkable Lungs/Pleura: 3 mm right upper lobe subpleural nodule, image 85 series 4, stable. Calcified 4 mm right middle lobe nodule on image 96 series 4, stable. 4 mm inferior right middle lobe nodule on image 108 series 4, stable. Enlargement of the subpleural right lower lobe nodule 1.4 by 1.1 cm on image 104 series 4, previously 1.0 by 0.9 cm on 07/12/2022. This has some increased peribronchovascular extension. Subpleural nodularity more medially in the right lower lobe measures 1.0 by 0.8 cm on image 98 series 4, formerly 0.9 by 0.7 cm. Musculoskeletal: Lower cervical  plate and screw fixator. Thoracic spondylosis. Reduce conspicuity of the patchy multifocal osseous sclerotic lesions representing osseous metastatic disease shown on prior exams. Continued large Schmorl's nodes versus mild compression deformities along the endplates of L5 with associated sclerosis. CT ABDOMEN PELVIS FINDINGS Hepatobiliary: Stable mild extrahepatic biliary dilatation with CBD at 0.9 cm, likely a physiologic response to cholecystectomy. No hepatic parenchymal lesion identified. Pancreas: Unremarkable Spleen: Unremarkable Adrenals/Urinary Tract: Unremarkable Stomach/Bowel: Mild sigmoid colon diverticulosis. Previous rectal stool ball has passed. Previous perirectal stranding no longer appreciable. Vascular/Lymphatic: Atherosclerosis is present, including aortoiliac atherosclerotic disease. 2.3 by 3.6 cm structure along the deep margin of the left iliac vessels at the level of the groin is thought to be from iliopsoas bursitis rather than adenopathy. Hard and soft plaque proximally in the superior mesenteric artery causing notable stenosis but without overt occlusion. Reproductive: Uterus absent.  Adnexa unremarkable. Other: No supplemental non-categorized findings. Musculoskeletal: As noted above there is suspected left iliopsoas bursitis. Left InterStim device noted. Left total hip prosthesis noted. Previous scattered osseous metastatic lesions in the lumbar spine and bony pelvis are relatively inconspicuous today. Grade 1 degenerative anterolisthesis at L4-5 contributing to central narrowing of the thecal sac. IMPRESSION: 1. Enlargement of the subpleural right lower lobe nodule 1.4 by 1.1 cm, previously 1.0 by 0.9 cm on 07/12/2022. Subpleural nodularity more medially in the right lower lobe measures 1.0 by 0.8 cm, formerly 0.9 by 0.7 cm. 2. Reduce conspicuity of the patchy multifocal osseous metastatic lesions representing osseous metastatic disease shown on prior exams. 3. Density anterior to the  left hip is thought to be from iliopsoas bursitis rather than adenopathy. Patient has a left total hip prosthesis. 4. central narrowing of the thecal sac at L4-5 primarily related to degenerative anterolisthesis. 5. Coronary, systemic, and aortic atherosclerosis. Aortic Atherosclerosis (ICD10-I70.0). Electronically Signed   By: Gaylyn Rong M.D.   On: 08/22/2022 12:18   CT ABDOMEN PELVIS W CONTRAST  Addendum Date: 07/31/2022   ADDENDUM REPORT: 07/31/2022 14:28 ADDENDUM: Extensive sigmoid colon diverticulosis. Electronically Signed   By: Ted Mcalpine M.D.   On: 07/31/2022 14:28   Result Date: 07/31/2022 CLINICAL DATA:  Left lower quadrant abdominal pain. EXAM: CT ABDOMEN AND PELVIS WITH CONTRAST TECHNIQUE: Multidetector CT imaging of the abdomen and pelvis was performed using the standard protocol following bolus administration of intravenous contrast. RADIATION DOSE REDUCTION: This exam was performed according to the departmental dose-optimization program which includes automated exposure control, adjustment of the mA and/or kV according to patient size and/or use of iterative reconstruction technique. CONTRAST:  OMNIPAQUE IOHEXOL 300 MG/ML  SOLN COMPARISON:  July 12, 2022 FINDINGS: Lower chest: Chronic interstitial lung changes. Previously noted minute pulmonary nodules in the right middle lobe are stable. Hepatobiliary: No focal liver abnormality is seen. Status post cholecystectomy. No biliary dilatation. Pancreas: Unremarkable. No pancreatic ductal dilatation or surrounding inflammatory changes. Spleen: Normal in size without focal abnormality. Adrenals/Urinary Tract:  Adrenal glands are unremarkable. Kidneys are normal, without renal calculi, focal lesion, or hydronephrosis. Bladder is unremarkable. Stomach/Bowel: Stomach is within normal limits. Appendix appears normal. No evidence of small bowel wall thickening, distention, or inflammatory changes. Extensive sigmoid colon  diverticulitis. Circumferential thickening of the rectum. Vascular/Lymphatic: No significant vascular findings are present. No enlarged abdominal or pelvic lymph nodes. Reproductive: Status post hysterectomy. No adnexal masses. Other: No abdominal wall hernia or abnormality. No abdominopelvic ascites. Musculoskeletal: Stable sclerotic changes in the spine. Stable compression deformity of T12 vertebral body. IMPRESSION: 1. Extensive sigmoid colon diverticulitis. 2. Circumferential thickening of the rectum. Please correlate with clinical symptoms of proctitis versus internal hemorrhoids. 3. Stable sclerotic changes in the spine. 4. Stable compression deformity of T12 vertebral body. 5. Chronic interstitial lung changes. 6. Previously noted minute pulmonary nodules in the right middle lobe are stable. Electronically Signed: By: Ted Mcalpine M.D. 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 Now with evidence of brain metastases, new skull mets, and progression of disease in the right lung (August 2024)   ***  Today, I talked to the patient and family about the findings and work-up thus far.  We discussed the natural history of *** and general treatment, highlighting the role of radiotherapy in the management.  We discussed the available radiation techniques, and focused on the details of logistics and delivery.  We reviewed the anticipated acute and late sequelae associated with radiation in this setting.  The patient was encouraged to ask questions that I answered to the best of my ability. *** A patient consent form was discussed and signed.  We retained a copy for our records.  The patient would like to proceed with radiation and will be scheduled  for CT simulation.  PLAN: ***    *** minutes of total time was spent for this patient encounter, including preparation, face-to-face counseling with the patient and coordination of care, physical exam, and documentation of the encounter.   ------------------------------------------------  Billie Lade, PhD, MD  This document serves as a record of services personally performed by Antony Blackbird, MD. It was created on his behalf by Neena Rhymes, a trained medical scribe. The creation of this record is based on the scribe's personal observations and the provider's statements to them. This document has been checked and approved by the attending provider.

## 2022-08-28 ENCOUNTER — Other Ambulatory Visit: Payer: Self-pay

## 2022-08-28 ENCOUNTER — Ambulatory Visit
Admission: RE | Admit: 2022-08-28 | Discharge: 2022-08-28 | Disposition: A | Discharge status: Home / Self Care | Payer: Medicare Other | Source: Ambulatory Visit | Attending: Radiation Oncology | Admitting: Radiation Oncology

## 2022-08-28 ENCOUNTER — Encounter: Payer: Self-pay | Admitting: Radiation Oncology

## 2022-08-28 VITALS — BP 170/86 | HR 57 | Temp 97.5°F | Resp 18 | Ht 60.0 in | Wt 150.1 lb

## 2022-08-28 DIAGNOSIS — Z79899 Other long term (current) drug therapy: Secondary | ICD-10-CM | POA: Insufficient documentation

## 2022-08-28 DIAGNOSIS — M48061 Spinal stenosis, lumbar region without neurogenic claudication: Secondary | ICD-10-CM | POA: Insufficient documentation

## 2022-08-28 DIAGNOSIS — C7931 Secondary malignant neoplasm of brain: Secondary | ICD-10-CM | POA: Diagnosis not present

## 2022-08-28 DIAGNOSIS — Z7982 Long term (current) use of aspirin: Secondary | ICD-10-CM | POA: Diagnosis not present

## 2022-08-28 DIAGNOSIS — I872 Venous insufficiency (chronic) (peripheral): Secondary | ICD-10-CM | POA: Insufficient documentation

## 2022-08-28 DIAGNOSIS — G4730 Sleep apnea, unspecified: Secondary | ICD-10-CM | POA: Insufficient documentation

## 2022-08-28 DIAGNOSIS — C3490 Malignant neoplasm of unspecified part of unspecified bronchus or lung: Secondary | ICD-10-CM

## 2022-08-28 DIAGNOSIS — M1990 Unspecified osteoarthritis, unspecified site: Secondary | ICD-10-CM | POA: Insufficient documentation

## 2022-08-28 DIAGNOSIS — D509 Iron deficiency anemia, unspecified: Secondary | ICD-10-CM | POA: Insufficient documentation

## 2022-08-28 DIAGNOSIS — Z86010 Personal history of colonic polyps: Secondary | ICD-10-CM | POA: Insufficient documentation

## 2022-08-28 DIAGNOSIS — D6481 Anemia due to antineoplastic chemotherapy: Secondary | ICD-10-CM | POA: Diagnosis not present

## 2022-08-28 DIAGNOSIS — C3431 Malignant neoplasm of lower lobe, right bronchus or lung: Secondary | ICD-10-CM | POA: Insufficient documentation

## 2022-08-28 DIAGNOSIS — M4316 Spondylolisthesis, lumbar region: Secondary | ICD-10-CM | POA: Diagnosis not present

## 2022-08-28 DIAGNOSIS — C3491 Malignant neoplasm of unspecified part of right bronchus or lung: Secondary | ICD-10-CM

## 2022-08-28 DIAGNOSIS — I700 Atherosclerosis of aorta: Secondary | ICD-10-CM | POA: Diagnosis not present

## 2022-08-28 DIAGNOSIS — J449 Chronic obstructive pulmonary disease, unspecified: Secondary | ICD-10-CM | POA: Insufficient documentation

## 2022-08-28 DIAGNOSIS — C7951 Secondary malignant neoplasm of bone: Secondary | ICD-10-CM | POA: Insufficient documentation

## 2022-08-28 DIAGNOSIS — M5030 Other cervical disc degeneration, unspecified cervical region: Secondary | ICD-10-CM | POA: Diagnosis not present

## 2022-08-28 DIAGNOSIS — I10 Essential (primary) hypertension: Secondary | ICD-10-CM | POA: Diagnosis not present

## 2022-08-28 DIAGNOSIS — G8929 Other chronic pain: Secondary | ICD-10-CM | POA: Diagnosis not present

## 2022-08-28 DIAGNOSIS — M5136 Other intervertebral disc degeneration, lumbar region: Secondary | ICD-10-CM | POA: Diagnosis not present

## 2022-08-28 DIAGNOSIS — F17210 Nicotine dependence, cigarettes, uncomplicated: Secondary | ICD-10-CM | POA: Diagnosis not present

## 2022-08-30 ENCOUNTER — Other Ambulatory Visit (HOSPITAL_COMMUNITY): Payer: Self-pay

## 2022-09-09 ENCOUNTER — Telehealth: Payer: Self-pay

## 2022-09-10 ENCOUNTER — Telehealth: Payer: Self-pay | Admitting: Medical Oncology

## 2022-09-10 NOTE — Telephone Encounter (Signed)
Please have provider complete [facility] requirements. Pt died 24-Sep-2022.

## 2022-09-11 ENCOUNTER — Encounter: Payer: Self-pay | Admitting: Internal Medicine

## 2022-09-15 NOTE — Telephone Encounter (Signed)
Pts appt moved to 11/18/22 at 11:30am with Hyacinth Meeker PA. Left detailed message for pt on voicemail regarding appt.

## 2022-09-15 DEATH — deceased

## 2022-09-17 ENCOUNTER — Ambulatory Visit: Payer: Medicare Other

## 2022-09-17 ENCOUNTER — Ambulatory Visit: Payer: Medicare Other | Admitting: Internal Medicine

## 2022-09-17 ENCOUNTER — Other Ambulatory Visit: Payer: Medicare Other

## 2022-09-17 ENCOUNTER — Encounter: Payer: Medicare Other | Admitting: Dietician

## 2022-09-18 ENCOUNTER — Ambulatory Visit: Payer: Medicare Other | Admitting: Physician Assistant

## 2022-09-18 ENCOUNTER — Ambulatory Visit: Payer: Medicare Other | Admitting: Internal Medicine

## 2022-09-19 ENCOUNTER — Ambulatory Visit: Payer: Medicare Other | Admitting: Internal Medicine

## 2022-09-19 ENCOUNTER — Other Ambulatory Visit: Payer: Medicare Other

## 2022-09-25 ENCOUNTER — Ambulatory Visit: Payer: Medicare Other

## 2022-09-25 ENCOUNTER — Other Ambulatory Visit: Payer: Medicare Other

## 2022-09-25 ENCOUNTER — Ambulatory Visit: Payer: Medicare Other | Admitting: Physician Assistant

## 2022-10-15 ENCOUNTER — Ambulatory Visit: Payer: Medicare Other | Admitting: Physician Assistant

## 2022-10-15 ENCOUNTER — Ambulatory Visit: Payer: Medicare Other

## 2022-10-15 ENCOUNTER — Other Ambulatory Visit: Payer: Medicare Other

## 2022-10-22 ENCOUNTER — Ambulatory Visit: Payer: Medicare Other | Admitting: Internal Medicine

## 2022-10-22 ENCOUNTER — Ambulatory Visit: Payer: Medicare Other

## 2022-10-22 ENCOUNTER — Other Ambulatory Visit: Payer: Medicare Other

## 2022-11-12 ENCOUNTER — Ambulatory Visit: Payer: Medicare Other | Admitting: Internal Medicine

## 2022-12-03 ENCOUNTER — Telehealth: Payer: Self-pay | Admitting: Medical Oncology

## 2022-12-03 NOTE — Telephone Encounter (Signed)
Path report faxed to pt 's insurance.

## 2023-05-26 ENCOUNTER — Other Ambulatory Visit: Payer: Self-pay
